# Patient Record
Sex: Female | Born: 1955 | Race: Black or African American | Hispanic: No | Marital: Married | Smoking: Former smoker
Health system: Southern US, Community
[De-identification: ages and names within clinical notes are randomized; demographics above are authoritative.]

## PROBLEM LIST (undated history)

## (undated) ENCOUNTER — Inpatient Hospital Stay: Admission: EM | Payer: Self-pay | Source: Home / Self Care

## (undated) DIAGNOSIS — R51 Headache: Secondary | ICD-10-CM

## (undated) DIAGNOSIS — K219 Gastro-esophageal reflux disease without esophagitis: Secondary | ICD-10-CM

## (undated) DIAGNOSIS — K1120 Sialoadenitis, unspecified: Secondary | ICD-10-CM

## (undated) DIAGNOSIS — Z923 Personal history of irradiation: Secondary | ICD-10-CM

## (undated) DIAGNOSIS — T8859XA Other complications of anesthesia, initial encounter: Secondary | ICD-10-CM

## (undated) DIAGNOSIS — I10 Essential (primary) hypertension: Secondary | ICD-10-CM

## (undated) DIAGNOSIS — K635 Polyp of colon: Secondary | ICD-10-CM

## (undated) DIAGNOSIS — Z800 Family history of malignant neoplasm of digestive organs: Secondary | ICD-10-CM

## (undated) DIAGNOSIS — F410 Panic disorder [episodic paroxysmal anxiety] without agoraphobia: Secondary | ICD-10-CM

## (undated) DIAGNOSIS — F17210 Nicotine dependence, cigarettes, uncomplicated: Secondary | ICD-10-CM

## (undated) DIAGNOSIS — K8020 Calculus of gallbladder without cholecystitis without obstruction: Secondary | ICD-10-CM

## (undated) DIAGNOSIS — C50919 Malignant neoplasm of unspecified site of unspecified female breast: Secondary | ICD-10-CM

## (undated) DIAGNOSIS — F419 Anxiety disorder, unspecified: Secondary | ICD-10-CM

## (undated) DIAGNOSIS — T4145XA Adverse effect of unspecified anesthetic, initial encounter: Secondary | ICD-10-CM

## (undated) DIAGNOSIS — Z8042 Family history of malignant neoplasm of prostate: Secondary | ICD-10-CM

## (undated) DIAGNOSIS — J449 Chronic obstructive pulmonary disease, unspecified: Secondary | ICD-10-CM

## (undated) DIAGNOSIS — Z862 Personal history of diseases of the blood and blood-forming organs and certain disorders involving the immune mechanism: Secondary | ICD-10-CM

## (undated) DIAGNOSIS — C801 Malignant (primary) neoplasm, unspecified: Secondary | ICD-10-CM

## (undated) DIAGNOSIS — Z803 Family history of malignant neoplasm of breast: Secondary | ICD-10-CM

## (undated) DIAGNOSIS — R7303 Prediabetes: Secondary | ICD-10-CM

## (undated) DIAGNOSIS — F32A Depression, unspecified: Secondary | ICD-10-CM

## (undated) DIAGNOSIS — F329 Major depressive disorder, single episode, unspecified: Secondary | ICD-10-CM

## (undated) DIAGNOSIS — M1990 Unspecified osteoarthritis, unspecified site: Secondary | ICD-10-CM

## (undated) DIAGNOSIS — T7840XA Allergy, unspecified, initial encounter: Secondary | ICD-10-CM

## (undated) DIAGNOSIS — S21009A Unspecified open wound of unspecified breast, initial encounter: Secondary | ICD-10-CM

## (undated) DIAGNOSIS — Z8709 Personal history of other diseases of the respiratory system: Secondary | ICD-10-CM

## (undated) DIAGNOSIS — M7989 Other specified soft tissue disorders: Secondary | ICD-10-CM

## (undated) DIAGNOSIS — K5730 Diverticulosis of large intestine without perforation or abscess without bleeding: Secondary | ICD-10-CM

## (undated) DIAGNOSIS — E785 Hyperlipidemia, unspecified: Secondary | ICD-10-CM

## (undated) DIAGNOSIS — D649 Anemia, unspecified: Secondary | ICD-10-CM

## (undated) DIAGNOSIS — M2550 Pain in unspecified joint: Secondary | ICD-10-CM

## (undated) HISTORY — DX: Malignant neoplasm of unspecified site of unspecified female breast: C50.919

## (undated) HISTORY — DX: Depression, unspecified: F32.A

## (undated) HISTORY — DX: Family history of malignant neoplasm of digestive organs: Z80.0

## (undated) HISTORY — DX: Unspecified osteoarthritis, unspecified site: M19.90

## (undated) HISTORY — DX: Major depressive disorder, single episode, unspecified: F32.9

## (undated) HISTORY — DX: Panic disorder (episodic paroxysmal anxiety): F41.0

## (undated) HISTORY — DX: Hyperlipidemia, unspecified: E78.5

## (undated) HISTORY — DX: Chronic obstructive pulmonary disease, unspecified: J44.9

## (undated) HISTORY — DX: Polyp of colon: K63.5

## (undated) HISTORY — PX: BREAST EXCISIONAL BIOPSY: SUR124

## (undated) HISTORY — PX: BIOPSY BREAST: PRO8

## (undated) HISTORY — DX: Calculus of gallbladder without cholecystitis without obstruction: K80.20

## (undated) HISTORY — DX: Other specified soft tissue disorders: M79.89

## (undated) HISTORY — DX: Diverticulosis of large intestine without perforation or abscess without bleeding: K57.30

## (undated) HISTORY — DX: Anemia, unspecified: D64.9

## (undated) HISTORY — DX: Gastro-esophageal reflux disease without esophagitis: K21.9

## (undated) HISTORY — PX: POLYPECTOMY: SHX149

## (undated) HISTORY — DX: Family history of malignant neoplasm of prostate: Z80.42

## (undated) HISTORY — DX: Anxiety disorder, unspecified: F41.9

## (undated) HISTORY — DX: Family history of malignant neoplasm of breast: Z80.3

## (undated) HISTORY — DX: Personal history of diseases of the blood and blood-forming organs and certain disorders involving the immune mechanism: Z86.2

## (undated) HISTORY — DX: Pain in unspecified joint: M25.50

## (undated) HISTORY — DX: Personal history of other diseases of the respiratory system: Z87.09

## (undated) HISTORY — DX: Nicotine dependence, cigarettes, uncomplicated: F17.210

## (undated) HISTORY — DX: Sialoadenitis, unspecified: K11.20

## (undated) HISTORY — PX: COLONOSCOPY: SHX174

---
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
## 1993-02-27 HISTORY — PX: OTHER SURGICAL HISTORY: SHX169

## 1998-02-27 HISTORY — PX: TOTAL ABDOMINAL HYSTERECTOMY: SHX209

## 1998-10-27 ENCOUNTER — Inpatient Hospital Stay (HOSPITAL_COMMUNITY): Admission: RE | Admit: 1998-10-27 | Discharge: 1998-10-30 | Payer: Self-pay | Admitting: Obstetrics & Gynecology

## 1998-10-27 ENCOUNTER — Encounter (INDEPENDENT_AMBULATORY_CARE_PROVIDER_SITE_OTHER): Payer: Self-pay

## 1999-11-02 ENCOUNTER — Encounter: Payer: Self-pay | Admitting: Pulmonary Disease

## 1999-11-02 ENCOUNTER — Ambulatory Visit (HOSPITAL_COMMUNITY): Admission: RE | Admit: 1999-11-02 | Discharge: 1999-11-02 | Payer: Self-pay | Admitting: Pulmonary Disease

## 1999-11-14 ENCOUNTER — Other Ambulatory Visit: Admission: RE | Admit: 1999-11-14 | Discharge: 1999-11-14 | Payer: Self-pay | Admitting: Obstetrics & Gynecology

## 1999-11-16 ENCOUNTER — Encounter: Payer: Self-pay | Admitting: Obstetrics & Gynecology

## 1999-11-16 ENCOUNTER — Ambulatory Visit (HOSPITAL_COMMUNITY): Admission: RE | Admit: 1999-11-16 | Discharge: 1999-11-16 | Payer: Self-pay | Admitting: Obstetrics & Gynecology

## 1999-11-25 ENCOUNTER — Ambulatory Visit (HOSPITAL_COMMUNITY): Admission: RE | Admit: 1999-11-25 | Discharge: 1999-11-25 | Payer: Self-pay | Admitting: Obstetrics & Gynecology

## 1999-11-25 ENCOUNTER — Encounter (INDEPENDENT_AMBULATORY_CARE_PROVIDER_SITE_OTHER): Payer: Self-pay | Admitting: Specialist

## 2000-11-01 ENCOUNTER — Other Ambulatory Visit: Admission: RE | Admit: 2000-11-01 | Discharge: 2000-11-01 | Payer: Self-pay | Admitting: Obstetrics & Gynecology

## 2000-12-26 ENCOUNTER — Encounter: Payer: Self-pay | Admitting: Emergency Medicine

## 2000-12-26 ENCOUNTER — Emergency Department (HOSPITAL_COMMUNITY): Admission: EM | Admit: 2000-12-26 | Discharge: 2000-12-26 | Payer: Self-pay | Admitting: Emergency Medicine

## 2001-11-04 ENCOUNTER — Other Ambulatory Visit: Admission: RE | Admit: 2001-11-04 | Discharge: 2001-11-04 | Payer: Self-pay | Admitting: Obstetrics & Gynecology

## 2002-09-04 ENCOUNTER — Emergency Department (HOSPITAL_COMMUNITY): Admission: EM | Admit: 2002-09-04 | Discharge: 2002-09-04 | Payer: Self-pay | Admitting: Emergency Medicine

## 2002-11-27 ENCOUNTER — Other Ambulatory Visit: Admission: RE | Admit: 2002-11-27 | Discharge: 2002-11-27 | Payer: Self-pay | Admitting: Obstetrics & Gynecology

## 2003-12-11 ENCOUNTER — Other Ambulatory Visit: Admission: RE | Admit: 2003-12-11 | Discharge: 2003-12-11 | Payer: Self-pay | Admitting: Obstetrics & Gynecology

## 2004-04-03 ENCOUNTER — Emergency Department (HOSPITAL_COMMUNITY): Admission: EM | Admit: 2004-04-03 | Discharge: 2004-04-03 | Payer: Self-pay | Admitting: Emergency Medicine

## 2004-04-28 ENCOUNTER — Other Ambulatory Visit: Admission: RE | Admit: 2004-04-28 | Discharge: 2004-04-28 | Payer: Self-pay | Admitting: Obstetrics & Gynecology

## 2004-09-26 ENCOUNTER — Ambulatory Visit: Payer: Self-pay | Admitting: Pulmonary Disease

## 2004-10-10 ENCOUNTER — Ambulatory Visit: Payer: Self-pay | Admitting: Gastroenterology

## 2004-10-12 ENCOUNTER — Ambulatory Visit: Payer: Self-pay | Admitting: Pulmonary Disease

## 2004-11-02 ENCOUNTER — Ambulatory Visit: Payer: Self-pay | Admitting: Gastroenterology

## 2004-12-05 ENCOUNTER — Other Ambulatory Visit: Admission: RE | Admit: 2004-12-05 | Discharge: 2004-12-05 | Payer: Self-pay | Admitting: Obstetrics & Gynecology

## 2005-02-28 ENCOUNTER — Ambulatory Visit: Payer: Self-pay | Admitting: Pulmonary Disease

## 2005-09-20 ENCOUNTER — Ambulatory Visit: Payer: Self-pay | Admitting: Pulmonary Disease

## 2006-02-27 HISTORY — PX: TRIGGER FINGER RELEASE: SHX641

## 2006-08-22 ENCOUNTER — Ambulatory Visit: Payer: Self-pay | Admitting: Pulmonary Disease

## 2006-08-22 LAB — CONVERTED CEMR LAB
ALT: 15 units/L (ref 0–35)
AST: 24 units/L (ref 0–37)
Basophils Relative: 0.9 % (ref 0.0–1.0)
Bilirubin, Direct: 0.2 mg/dL (ref 0.0–0.3)
CO2: 28 meq/L (ref 19–32)
Calcium: 9.7 mg/dL (ref 8.4–10.5)
Chloride: 106 meq/L (ref 96–112)
Creatinine, Ser: 0.7 mg/dL (ref 0.4–1.2)
Eosinophils Absolute: 0.2 10*3/uL (ref 0.0–0.6)
Eosinophils Relative: 2.4 % (ref 0.0–5.0)
GFR calc non Af Amer: 93 mL/min
Glucose, Bld: 86 mg/dL (ref 70–99)
HCT: 37.3 % (ref 36.0–46.0)
Ketones, ur: NEGATIVE mg/dL
Neutrophils Relative %: 62.8 % (ref 43.0–77.0)
Nitrite: NEGATIVE
Platelets: 205 10*3/uL (ref 150–400)
RBC: 4.94 M/uL (ref 3.87–5.11)
RDW: 13.4 % (ref 11.5–14.6)
Sodium: 144 meq/L (ref 135–145)
Specific Gravity, Urine: >=1.03 (ref 1.000–1.03)
Total Bilirubin: 0.7 mg/dL (ref 0.3–1.2)
Total CHOL/HDL Ratio: 2.8
Total Protein, Urine: NEGATIVE mg/dL
Triglycerides: 88 mg/dL (ref 0–149)
Urine Glucose: NEGATIVE mg/dL
Urobilinogen, UA: 0.2 (ref 0.0–1.0)
WBC: 8.1 10*3/uL (ref 4.5–10.5)
pH: 6 (ref 5.0–8.0)

## 2006-08-27 ENCOUNTER — Ambulatory Visit (HOSPITAL_COMMUNITY): Admission: RE | Admit: 2006-08-27 | Discharge: 2006-08-27 | Payer: Self-pay | Admitting: Pulmonary Disease

## 2006-09-10 ENCOUNTER — Ambulatory Visit: Payer: Self-pay | Admitting: Pulmonary Disease

## 2006-09-10 LAB — CONVERTED CEMR LAB
Fecal Occult Blood: NEGATIVE
OCCULT 2: NEGATIVE
OCCULT 5: NEGATIVE

## 2006-10-12 ENCOUNTER — Ambulatory Visit: Payer: Self-pay | Admitting: Pulmonary Disease

## 2007-04-23 ENCOUNTER — Ambulatory Visit: Payer: Self-pay | Admitting: Pulmonary Disease

## 2007-04-23 DIAGNOSIS — F4323 Adjustment disorder with mixed anxiety and depressed mood: Secondary | ICD-10-CM

## 2007-04-23 DIAGNOSIS — R51 Headache: Secondary | ICD-10-CM | POA: Insufficient documentation

## 2007-04-23 DIAGNOSIS — R519 Headache, unspecified: Secondary | ICD-10-CM | POA: Insufficient documentation

## 2007-04-23 DIAGNOSIS — I872 Venous insufficiency (chronic) (peripheral): Secondary | ICD-10-CM

## 2007-04-23 DIAGNOSIS — E785 Hyperlipidemia, unspecified: Secondary | ICD-10-CM

## 2007-10-09 ENCOUNTER — Ambulatory Visit: Payer: Self-pay | Admitting: Pulmonary Disease

## 2007-10-13 DIAGNOSIS — F17200 Nicotine dependence, unspecified, uncomplicated: Secondary | ICD-10-CM

## 2007-10-13 DIAGNOSIS — J40 Bronchitis, not specified as acute or chronic: Secondary | ICD-10-CM | POA: Insufficient documentation

## 2007-10-13 DIAGNOSIS — D869 Sarcoidosis, unspecified: Secondary | ICD-10-CM | POA: Insufficient documentation

## 2007-10-13 DIAGNOSIS — E559 Vitamin D deficiency, unspecified: Secondary | ICD-10-CM

## 2007-10-13 DIAGNOSIS — N390 Urinary tract infection, site not specified: Secondary | ICD-10-CM | POA: Insufficient documentation

## 2007-10-13 DIAGNOSIS — D649 Anemia, unspecified: Secondary | ICD-10-CM | POA: Insufficient documentation

## 2007-10-13 LAB — CONVERTED CEMR LAB
Albumin: 3.9 g/dL (ref 3.5–5.2)
Alkaline Phosphatase: 67 units/L (ref 39–117)
BUN: 13 mg/dL (ref 6–23)
Basophils Relative: 0.4 % (ref 0.0–3.0)
Bilirubin Urine: NEGATIVE
Calcium: 9.9 mg/dL (ref 8.4–10.5)
Cholesterol: 199 mg/dL (ref 0–200)
Crystals: NEGATIVE
Eosinophils Absolute: 0.1 10*3/uL (ref 0.0–0.7)
Eosinophils Relative: 1.6 % (ref 0.0–5.0)
GFR calc Af Amer: 113 mL/min
Glucose, Bld: 92 mg/dL (ref 70–99)
HCT: 36.9 % (ref 36.0–46.0)
HDL: 67.5 mg/dL (ref >39.0)
Hemoglobin, Urine: NEGATIVE
Ketones, ur: NEGATIVE mg/dL
MCHC: 33.1 g/dL (ref 30.0–36.0)
MCV: 75.6 fL — ABNORMAL LOW (ref 78.0–100.0)
Monocytes Absolute: 0.5 10*3/uL (ref 0.1–1.0)
Platelets: 197 10*3/uL (ref 150–400)
Potassium: 4.7 meq/L (ref 3.5–5.1)
RBC: 4.88 M/uL (ref 3.87–5.11)
Total Protein: 7.1 g/dL (ref 6.0–8.3)
Urine Glucose: NEGATIVE mg/dL
Urobilinogen, UA: 0.2 (ref 0.0–1.0)
WBC: 8.3 10*3/uL (ref 4.5–10.5)

## 2008-04-22 ENCOUNTER — Ambulatory Visit: Payer: Self-pay | Admitting: Internal Medicine

## 2008-07-22 ENCOUNTER — Emergency Department (HOSPITAL_COMMUNITY): Admission: EM | Admit: 2008-07-22 | Discharge: 2008-07-22 | Payer: Self-pay | Admitting: Emergency Medicine

## 2008-07-23 ENCOUNTER — Ambulatory Visit: Payer: Self-pay | Admitting: Pulmonary Disease

## 2008-07-23 DIAGNOSIS — F410 Panic disorder [episodic paroxysmal anxiety] without agoraphobia: Secondary | ICD-10-CM

## 2008-08-07 ENCOUNTER — Ambulatory Visit: Payer: Self-pay | Admitting: Licensed Clinical Social Worker

## 2008-08-21 ENCOUNTER — Ambulatory Visit: Payer: Self-pay | Admitting: Licensed Clinical Social Worker

## 2008-09-18 ENCOUNTER — Ambulatory Visit: Payer: Self-pay | Admitting: Licensed Clinical Social Worker

## 2008-11-06 ENCOUNTER — Ambulatory Visit: Payer: Self-pay | Admitting: Pulmonary Disease

## 2008-11-06 DIAGNOSIS — M1990 Unspecified osteoarthritis, unspecified site: Secondary | ICD-10-CM | POA: Insufficient documentation

## 2008-11-07 LAB — CONVERTED CEMR LAB
Albumin: 4 g/dL (ref 3.5–5.2)
Basophils Absolute: 0 10*3/uL (ref 0.0–0.1)
Bilirubin Urine: NEGATIVE
CO2: 30 meq/L (ref 19–32)
Calcium: 9.8 mg/dL (ref 8.4–10.5)
Direct LDL: 119.6 mg/dL
Eosinophils Absolute: 0.2 10*3/uL (ref 0.0–0.7)
Glucose, Bld: 91 mg/dL (ref 70–99)
HCT: 36.8 % (ref 36.0–46.0)
Hemoglobin: 11.9 g/dL — ABNORMAL LOW (ref 12.0–15.0)
Iron: 76 ug/dL (ref 42–145)
Leukocytes, UA: NEGATIVE
Lymphocytes Relative: 22.9 % (ref 12.0–46.0)
Lymphs Abs: 1.9 10*3/uL (ref 0.7–4.0)
MCHC: 32.4 g/dL (ref 30.0–36.0)
Monocytes Absolute: 0.6 10*3/uL (ref 0.1–1.0)
Neutro Abs: 5.6 10*3/uL (ref 1.4–7.7)
RDW: 14.5 % (ref 11.5–14.6)
Saturation Ratios: 21.1 % (ref 20.0–50.0)
Sodium: 144 meq/L (ref 135–145)
TSH: 1.73 microintl units/mL (ref 0.35–5.50)
Transferrin: 257.3 mg/dL (ref 212.0–360.0)
Triglycerides: 78 mg/dL (ref 0.0–149.0)
Urobilinogen, UA: 0.2 (ref 0.0–1.0)

## 2009-02-27 HISTORY — PX: BUNIONECTOMY: SHX129

## 2009-08-19 ENCOUNTER — Telehealth: Payer: Self-pay | Admitting: Gastroenterology

## 2009-08-23 ENCOUNTER — Encounter (INDEPENDENT_AMBULATORY_CARE_PROVIDER_SITE_OTHER): Payer: Self-pay | Admitting: *Deleted

## 2009-10-27 ENCOUNTER — Encounter (INDEPENDENT_AMBULATORY_CARE_PROVIDER_SITE_OTHER): Payer: Self-pay | Admitting: *Deleted

## 2009-10-29 ENCOUNTER — Ambulatory Visit: Payer: Self-pay | Admitting: Gastroenterology

## 2009-11-05 ENCOUNTER — Ambulatory Visit: Payer: Self-pay | Admitting: Gastroenterology

## 2009-11-09 ENCOUNTER — Encounter: Payer: Self-pay | Admitting: Gastroenterology

## 2009-11-11 ENCOUNTER — Encounter: Payer: Self-pay | Admitting: Gastroenterology

## 2009-11-26 ENCOUNTER — Encounter: Payer: Self-pay | Admitting: Pulmonary Disease

## 2010-01-04 ENCOUNTER — Telehealth: Payer: Self-pay | Admitting: Pulmonary Disease

## 2010-01-24 ENCOUNTER — Telehealth (INDEPENDENT_AMBULATORY_CARE_PROVIDER_SITE_OTHER): Payer: Self-pay | Admitting: *Deleted

## 2010-01-25 ENCOUNTER — Ambulatory Visit: Payer: Self-pay | Admitting: Pulmonary Disease

## 2010-01-25 DIAGNOSIS — K5730 Diverticulosis of large intestine without perforation or abscess without bleeding: Secondary | ICD-10-CM | POA: Insufficient documentation

## 2010-01-25 DIAGNOSIS — D126 Benign neoplasm of colon, unspecified: Secondary | ICD-10-CM | POA: Insufficient documentation

## 2010-01-28 LAB — CONVERTED CEMR LAB
AST: 13 units/L (ref 0–37)
Albumin: 4 g/dL (ref 3.5–5.2)
BUN: 15 mg/dL (ref 6–23)
Basophils Absolute: 0 10*3/uL (ref 0.0–0.1)
CO2: 26 meq/L (ref 19–32)
Direct LDL: 142.6 mg/dL
Eosinophils Relative: 1.3 % (ref 0.0–5.0)
GFR calc non Af Amer: 128.1 mL/min (ref >60)
Glucose, Bld: 98 mg/dL (ref 70–99)
HCT: 37 % (ref 36.0–46.0)
HDL: 89.2 mg/dL (ref >39.00)
Iron: 80 ug/dL (ref 42–145)
Lymphocytes Relative: 24 % (ref 12.0–46.0)
Lymphs Abs: 2.2 10*3/uL (ref 0.7–4.0)
Monocytes Relative: 6.1 % (ref 3.0–12.0)
Neutrophils Relative %: 68.3 % (ref 43.0–77.0)
Platelets: 216 10*3/uL (ref 150.0–400.0)
Potassium: 4.3 meq/L (ref 3.5–5.1)
RDW: 15.4 % — ABNORMAL HIGH (ref 11.5–14.6)
Saturation Ratios: 19.9 % — ABNORMAL LOW (ref 20.0–50.0)
TSH: 1.45 microintl units/mL (ref 0.35–5.50)
Total Protein: 7 g/dL (ref 6.0–8.3)
Transferrin: 287.3 mg/dL (ref 212.0–360.0)
Triglycerides: 93 mg/dL (ref 0.0–149.0)
Vit D, 25-Hydroxy: 37 ng/mL (ref 30–89)
WBC: 9.2 10*3/uL (ref 4.5–10.5)

## 2010-03-29 NOTE — Miscellaneous (Signed)
Summary: previsit prep/Rm  Clinical Lists Changes  Medications: Added new medication of MOVIPREP 100 GM  SOLR (PEG-KCL-NACL-NASULF-NA ASC-C) As per prep instructions. - Signed Rx of MOVIPREP 100 GM  SOLR (PEG-KCL-NACL-NASULF-NA ASC-C) As per prep instructions.;  #1 x 0;  Signed;  Entered by: Sherren Kerns RN;  Authorized by: Louis Meckel MD;  Method used: Electronically to [facility]. [phone]*, [address] [place], [place]  [address], Ph: [phone], Fax: [phone] Observations: Added new observation of ALLERGY REV: Done (10/29/2009 9:50)    Prescriptions: MOVIPREP 100 GM  SOLR (PEG-KCL-NACL-NASULF-NA ASC-C) As per prep instructions.  #1 x 0   Entered by:   Sherren Kerns RN   Authorized by:   Louis Meckel MD   Signed by:   Sherren Kerns RN on 10/29/2009   Method used:   Electronically to        [facility] [address] [phone]* (retail)       [address] [place], [place]  [address]       Ph: [phone]       Fax: [phone]   RxID:   [id number]

## 2010-03-29 NOTE — Letter (Signed)
Summary: [facility] Instructions  [facility]  [address] [place], [place] [address]   Phone: [phone]  Fax: [phone]       CAIYA BETTES    [birth year]    MRN: [id number]        Procedure Day Dorna Bloom:  Farrell Ours  11/05/09     Arrival Time:  8:00AM     Procedure Time:  9:00AM     Location of Procedure:                    _ X_  [facility] (4th Floor)                       PREPARATION FOR COLONOSCOPY WITH MOVIPREP   Starting 5 days prior to your procedure 10/31/09 do not eat nuts, seeds, popcorn, corn, beans, peas,  salads, or any raw vegetables.  Do not take any fiber supplements (e.g. Metamucil, Citrucel, and Benefiber).  THE DAY BEFORE YOUR PROCEDURE         DATE: 11/04/09  DAY: THURSDAY  1.  Drink clear liquids the entire day-NO SOLID FOOD  2.  Do not drink anything colored red or purple.  Avoid juices with pulp.  No orange juice.  3.  Drink at least 64 oz. (8 glasses) of fluid/clear liquids during the day to prevent dehydration and help the prep work efficiently.  CLEAR LIQUIDS INCLUDE: Water Jello Ice Popsicles Tea (sugar ok, no milk/cream) Powdered fruit flavored drinks Coffee (sugar ok, no milk/cream) Gatorade Juice: apple, white grape, white cranberry  Lemonade Clear bullion, consomm, broth Carbonated beverages (any kind) Strained chicken noodle soup Hard Candy                             4.  In the morning, mix first dose of MoviPrep solution:    Empty 1 Pouch A and 1 Pouch B into the disposable container    Add lukewarm drinking water to the top line of the container. Mix to dissolve    Refrigerate (mixed solution should be used within 24 hrs)  5.  Begin drinking the prep at 5:00 p.m. The MoviPrep container is divided by 4 marks.   Every 15 minutes drink the solution down to the next mark (approximately 8 oz) until the full liter is complete.   6.  Follow completed prep with 16 oz of clear liquid of your choice (Nothing  red or purple).  Continue to drink clear liquids until bedtime.  7.  Before going to bed, mix second dose of MoviPrep solution:    Empty 1 Pouch A and 1 Pouch B into the disposable container    Add lukewarm drinking water to the top line of the container. Mix to dissolve    Refrigerate  THE DAY OF YOUR PROCEDURE      DATE: 11/05/09   DAY:  FRIDAY  Beginning at 4:00AM (5 hours before procedure):         1. Every 15 minutes, drink the solution down to the next mark (approx 8 oz) until the full liter is complete.  2. Follow completed prep with 16 oz. of clear liquid of your choice.    3. You may drink clear liquids until 7:00AM (2 HOURS BEFORE PROCEDURE).   MEDICATION INSTRUCTIONS  Unless otherwise instructed, you should take regular prescription medications with a small sip of water   as early as possible the  morning of your procedure.   Additional medication instructions: Stop Iron pill 7days prior to procedure,today         OTHER INSTRUCTIONS  You will need a responsible adult at least 56 years of age to accompany you and drive you home.   This person must remain in the waiting room during your procedure.  Wear loose fitting clothing that is easily removed.  Leave jewelry and other valuables at home.  However, you may wish to bring a book to read or  an iPod/MP3 player to listen to music as you wait for your procedure to start.  Remove all body piercing jewelry and leave at home.  Total time from sign-in until discharge is approximately 2-3 hours.  You should go home directly after your procedure and rest.  You can resume normal activities the  day after your procedure.  The day of your procedure you should not:   Drive   Make legal decisions   Operate machinery   Drink alcohol   Return to work  You will receive specific instructions about eating, activities and medications before you leave.    The above instructions have been reviewed and explained to  me by   Sherren Kerns RN  October 29, 2009 10:16 AM    I fully understand and can verbalize these instructions _____________________________ Date _________

## 2010-03-29 NOTE — Progress Notes (Signed)
Summary: Recall Colon Scheduled  Phone Note Call from Patient Call back at [phone]   Caller: Patient Call For: Dr. Arlyce Dice Reason for Call: Talk to Nurse Summary of Call: pt would like to move up her recall COL date... father recently diagnosed with COL cancer... pt interested in having COL's every 5 years Initial call taken by: Vallarie Mare,  August 19, 2009 4:15 PM  Follow-up for Phone Call        She should have colo 5 years from last exam Follow-up by: Louis Meckel MD,  August 23, 2009 8:36 AM  Additional Follow-up for Phone Call Additional follow up Details #1::        Last Colon 11-02-2004, so she will need one in September.  Pt. scheduled her previsit for 10-29-09 at 10am and Colonoscopy on 11-05-09 at 9am. Pt. instructed to call back as needed.  Additional Follow-up by: Laureen Ochs LPN,  August 23, 2009 10:25 AM

## 2010-03-29 NOTE — Procedures (Signed)
Summary: Colonoscopy  Patient: [name] Note: All result statuses are Final unless otherwise noted.  Tests: (1) Colonoscopy (COL)   COL Colonoscopy           DONE     [facility]     [address]     [place], [place]  [address]           COLONOSCOPY PROCEDURE REPORT           PATIENT:  Marilyn [name], Marilyn [name]  MR#:  [id number]     BIRTHDATE:  07/14/1955, 54 yrs. old  GENDER:  female           ENDOSCOPIST:  Barbette Hair. Arlyce Dice, MD     Referred by:           PROCEDURE DATE:  11/05/2009     PROCEDURE:  Colon with cold biopsy polypectomy     ASA CLASS:  Class II     INDICATIONS:  1) Elevated Risk Screening  2) family history of     colon cancer Father           MEDICATIONS:   Fentanyl 100 mcg IV, Versed 10 mg IV           DESCRIPTION OF PROCEDURE:   After the risks benefits and     alternatives of the procedure were thoroughly explained, informed     consent was obtained.  Digital rectal exam was performed and     revealed no abnormalities.   The [id number] endoscope was     introduced through the anus and advanced to the cecum, which was     identified by both the appendix and ileocecal valve, without     limitations.  The quality of the prep was excellent, using     MoviPrep.  The instrument was then slowly withdrawn as the colon     was fully examined.     <<PROCEDUREIMAGES>>           FINDINGS:  Two polyps were found in the sigmoid colon (see image10     and image8). 2 2mm polyps at 15 and 20cm from anus - removed via     cold bx forceps, submitted to pathology  Mild diverticulosis was     found in the sigmoid colon (see image1).  This was otherwise a     normal examination of the colon (see image2, image3, image4,     image5, and image11).   Retroflexed views in the rectum revealed     no abnormalities.    The time to cecum =  4.0  minutes. The scope     was then withdrawn (time =  6.25  min) from the patient and the     procedure completed.        COMPLICATIONS:  None           ENDOSCOPIC IMPRESSION:     1) Two polyps in the sigmoid colon     2) Mild diverticulosis in the sigmoid colon     3) Otherwise normal examination     RECOMMENDATIONS:     1) Given your significant family history of colon cancer, you     should have a repeat colonoscopy in 5 years           REPEAT EXAM:  In 5 year(s) for Colonoscopy.           ______________________________     Barbette Hair. Arlyce Dice, MD  CC: Michele Mcalpine, MD           n.     Rosalie DoctorBarbette Hair. Kaplan at 11/05/2009 09:41 AM           Standley Dakins, [id number]  Note: An exclamation mark (!) indicates a result that was not dispersed into the flowsheet. Document Creation Date: 11/05/2009 9:42 AM _______________________________________________________________________  (1) Order result status: Final Collection or observation date-time: 11/05/2009 09:35 Requested date-time:  Receipt date-time:  Reported date-time:  Referring Physician:   Ordering Physician: Melvia Heaps [phone]) Specimen Source:  Source: Launa Grill Order Number: [id number] Lab site:   Appended Document: Colonoscopy     Procedures Next Due Date:    Colonoscopy: 10/2014

## 2010-03-29 NOTE — Miscellaneous (Signed)
Summary: Fluvirin/[facility]  [facility]irin/[facility]   Imported By: [employer] 12/07/2009 09:09:06  _____________________________________________________________________  External Attachment:    Type:   Image     Comment:   External Document

## 2010-03-29 NOTE — Letter (Signed)
Summary: Results Letter  [facility]  [address] [place], [place] [address]   Phone: [phone]  Fax: [phone]        November 11, 2009 MRN: [id number]    Marilyn [name] [address] [place], [place]  [address]    Dear [name], Your colon biopsies  did not show any remarkable findings. In view of your family history of colon cancer, however, I recommend followup colonoscopy in 5 years.  Please continue with the recommendations previously discussed.  Should you have any further questions or immediate concers, feel free to contact me.  Sincerely,  Barbette Hair. Arlyce Dice, M.D., [facility]          Sincerely,  Louis Meckel MD  This letter has been electronically signed by your physician.  Appended Document: Results Letter letter mailed  Appended Document: flu shot 11-26-2009    Clinical Lists Changes  Observations: Added new observation of FLU VAX: Historical (11/26/2009 16:14)       Immunization History:  Influenza Immunization History:    Influenza:  historical (11/26/2009)

## 2010-03-29 NOTE — Progress Notes (Signed)
Summary: lab order  Phone Note Call from Patient Call back at Home Phone [phone]   Caller: Patient Call For: madel Reason for Call: Talk to Nurse Summary of Call: Patient has a physical schedule tomorrow @ 3:00pm.  Patient is asking for an order for labwork so she can come in tomorrow morning for fasting labs. Initial call taken by: Lehman Prom,  January 24, 2010 1:13 PM  Follow-up for Phone Call        per SN---ok for labs  lip-272.0/bmp-401.9/hepat-790.5/cbcd-285.9/tsh-244.9/ace level-135/vit d-733.00.  thanks Randell Loop CMA  January 25, 2010 11:45 AM   Labs in [place].  Pt aware and will have these drawn prior to appt today.  Follow-up by: Gweneth Dimitri RN,  January 25, 2010 12:02 PM

## 2010-03-29 NOTE — Letter (Signed)
Summary: Results Letter  [facility]  [address] [place], [place] [address]   Phone: [phone]  Fax: [phone]        November 09, 2009 MRN: [id number]    ADELIS DOCTER [address] [place], [place]  [address]    Dear [name],  Your biopsy results did not show any remarkable findings. I recommend followup colonoscopy in view of your family history of colon cancer.   Please continue with the recommendations previously discussed.  Should you have any further questions or immediate concers, feel free to contact me.  Sincerely,  Barbette Hair. Arlyce Dice, M.D., [facility]          Sincerely,  Louis Meckel MD  This letter has been electronically signed by your physician.

## 2010-03-29 NOTE — Progress Notes (Signed)
Summary: RETURNED CALL  Phone Note Call from Patient Call back at [facility] Phone [phone]   Caller: Patient Call For: Kahley Leib Summary of Call: PT RETURNED CALL FROM LEIGH. CALL # ABOVE OR CELL [phone] Initial call taken by: Tivis Ringer, CNA,  January 04, 2010 12:19 PM  Follow-up for Phone Call        CALLED AND SPOKE WITH PT AND SHE IS AWARE OF APPT BEING RESCHEDULED TO 11-29 AT 3PM---PT WILL CALL ME THE DAY BEFORE SHE COMES IN FOR HER LABS Randell Loop CMA  January 04, 2010 12:24 PM

## 2010-03-29 NOTE — Assessment & Plan Note (Signed)
Summary: CPX/LA   CC:  Yearly ROV & CPX....  History of Present Illness: 55 y/o BF here for a follow up visit... she has multiple medical problems as noted below...    ~  November 06, 2008:  here for CPX- anxiety/ panic much improved w/ counselling and incr Zoloft by Psych, uses the Rebeca Allegra just as needed now... still smoking  ~1/2 ppd and failed Chantix prev- we discussed options...  denies CP, palpit, SOB, cough/ phlegm, etc...   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 she had bilat bunion operations by Podiarty & has been less active w/ 22# weight gain & we discussed incr exercise (when her boot comes off) & diet therapy... she continues to smoke 1/2 ppd & wants to try nicotine replacement Rx since a relative had success w/ this; f/u CXR remains clear... she saw [name] for f/u colonoscopy 9/11- pos for divertics & 2 polyps= polypoid mucosa, f/u planned 58yrs due to [facility]...    Current Problem List:  PHYSICAL EXAMINATION (ICD-V70.0) - GYN= DrNeal who does her PAP smears, Mammograms, and BMD... he also checked her VitD level and started VitD 50K weekly, then changed her to 2000 u daily... she is s/p hysterectomy in 2000... ***note- she had an abd bruit investigated w/ an Abd MRA 6/08 and nothing found...  had TETANUS shot 2010 at [facility] after she stepped on nail...  Hx of BRONCHITIS (ICD-490) - no recent episodes...  ~  Baseline CXR is clear and WNL (no residuals from her sarcoidosis)  ~  Spirometry 7/06 showed FVC= 3.77 (130%), FEV1= 2.71 (113%), %1sec=72, mid-flows=66%...   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 totally resolved on Pred therapy w/ normalization of CXR and no recurrence off Pred now since 1992...  ~  CXR 9/10 remains clear & WNL.Marland Kitchen.  ~  CXR 11/11 remains clear & WNL.Marland KitchenMarland Kitchen  CIGARETTE SMOKER (ICD-305.1) - she continues to smoke 1/2 ppd>  we have discussed smoking cessation strategies- she tried Chantix in the past w/o help... she tells me she is going to try nicotine replacement Rx since this helped a relative of hres.  VENOUS INSUFFICIENCY (ICD-459.81) - she follows a low salt diet, elevates when nec, and wears support hose Prn.  HYPERLIPIDEMIA (ICD-272.4) - hx of hyperlipidemia w/ high HDL's prev...  ~  FLP 7/07 showed TChol 210, TG 56, HDL 81, LDL 101  ~  FLP 6/08 showed TChol 232, TG 88, HDL 83, LDL 112  ~  FLP 8/09 showed TChol 199, TG 85, HDL 68, LDL 115  ~  FLP 9/10 (wt=175#) showed TChol 231, TG 78, HDL 94, LDL 120  ~  FLP 11/11 (wt=197#) showed TChol 265, TG 93, HDL 89, LDL 143... needs better diet get wt down.  GERD (ICD-530.81) - uses OTC Prilosec as needed.  DIVERTICULOSIS OF COLON (ICD-562.10) IRRITABLE BOWEL SYNDROME (ICD-564.1) COLONIC POLYPS (ICD-211.3) Family Hx of COLON CANCER (ICD-153.9) -   ~  colonoscopy 9/06 by Dorris Singh was WNL- no divertics, no polyps... f/u planned 56yrs.  ~  Father was diagnosed w/ colon cancer in 2009 & being treated by oncology... pt will need colon f/u in 2011.  ~  f/u colonoscopy 9/11 by [name] showed divertics & 2 polyps= polypoid mucosa, f/u planned 66yrs.  Hx of UTI (ICD-599.0)  DEGENERATIVE JOINT DISEASE (ICD-715.90) - she has mild DJD esp in  knees & prev had cortisone shots from ortho- DrMcKinley... not on regular meds, she uses Tylenol Prn...  VITAMIN D DEFICIENCY (ICD-268.9) - followed by DrNeal & currently taking Vit D 1000 u caps- 2 daily.  HEADACHE (ICD-784.0)  Hx of DEPRESSION (ICD-311) - husb passed away in 07-18-03... she sees DrMcKinney on ZOLOFT 100mg /d...  Hx of ANEMIA (ICD-285.9) - Hg chronically in the 11-12 range...  ~  labs 9/10 showed Hg= 11.9, MCV= 77, Fe= 76 (21%sat)  ~  labs 11/11 showed Hg= 12.0, MCV= 76, Fe= 80 (20%sat)   Preventive Screening-Counseling & Management  Alcohol-Tobacco     Smoking Status: current     Packs/Day: 1/2 ppd     Year  Started: 1080's  Allergies: 1)  ! Penicillin  Comments:  Nurse/Medical Assistant: The patient's medications and allergies were reviewed with the patient and were updated in the Medication and Allergy Lists.  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) CIGARETTE SMOKER (ICD-305.1) VENOUS INSUFFICIENCY (ICD-459.81) HYPERLIPIDEMIA (ICD-272.4) GERD (ICD-530.81) DIVERTICULOSIS OF COLON (ICD-562.10) IRRITABLE BOWEL SYNDROME (ICD-564.1) COLONIC POLYPS (ICD-211.3) Family Hx of COLON CANCER (ICD-153.9) Hx of UTI (ICD-599.0) DEGENERATIVE JOINT DISEASE (ICD-715.90) VITAMIN D DEFICIENCY (ICD-268.9) HEADACHE (ICD-784.0) PANIC DISORDER (ICD-300.01) DEPRESSION (ICD-311) Hx of ANEMIA (ICD-285.9)  Past Surgical History: S/P hysterectomy in 07/18/98 by DrNeal S/P trigger finger surgery by DrWeingold 07-18-2006 S/P bilat bunionectomies by DrTuckman 2009-07-17  Family History: Reviewed history from 11/06/2008 and no changes required. mother alive age 24, hx anemia & +HPylori father alive age 33, hx prostate cancer & dx w/ colon ca 2007/07/18 3 Siblings: 2 Sisters- one w/ hx breast cancer, one w/ sarcoid & MS 1 Briother-  hx of HBP,prostate cancer, hyperchol.  Social History: Reviewed history from 11/06/2008 and no changes required. Widow, husb died 18-Jul-2003 2 step-children smokes 1/2 ppd exercise 3 times per week 2 cups of caffeine daily etoh---social Employ: post office  Review of Systems  The patient denies fever, chills, sweats, anorexia, fatigue, weakness, malaise, weight loss, sleep disorder, blurring, diplopia, eye irritation, eye discharge, vision loss, eye pain, photophobia, earache, ear discharge, tinnitus, decreased hearing, nasal congestion, nosebleeds, sore throat, hoarseness, chest pain, palpitations, syncope, dyspnea on exertion, orthopnea, PND, peripheral edema, cough, dyspnea at rest, excessive sputum, hemoptysis, wheezing, pleurisy, nausea,  vomiting, diarrhea, constipation, change in bowel habits, abdominal pain, melena, hematochezia, jaundice, gas/bloating, indigestion/heartburn, dysphagia, odynophagia, dysuria, hematuria, urinary frequency, urinary hesitancy, nocturia, incontinence, back pain, joint pain, joint swelling, muscle cramps, muscle weakness, stiffness, arthritis, sciatica, restless legs, leg pain at night, leg pain with exertion, rash, itching, dryness, suspicious lesions, paralysis, paresthesias, seizures, tremors, vertigo, transient blindness, frequent falls, frequent headaches, difficulty walking, depression, anxiety, memory loss, confusion, cold intolerance, heat intolerance, polydipsia, polyphagia, polyuria, unusual weight change, abnormal bruising, bleeding, enlarged lymph nodes, urticaria, allergic rash, hay fever, and recurrent infections.    Vital Signs:  Patient profile:   55 year old female Height:      65 inches Weight:      196.38 pounds BMI:     32.80 O2 Sat:      100 % on Room air Temp:     99.5 degrees F oral Pulse rate:   70 / minute BP sitting:   116 / 80  (left arm) Cuff size:   regular  Vitals Entered By: Randell Loop CMA (January 25, 2010 2:46 PM)  O2 Sat at Rest %:  100 O2 Flow:  Room air CC: Yearly ROV & CPX... Is Patient Diabetic?  No Pain Assessment Patient in pain? no      Comments meds updated today with pt   Physical Exam  Additional Exam:  WD, overweight, 54 y/o BF in NAD... GENERAL:  Alert & oriented; pleasant & cooperative... HEENT:  [place]/AT, EOM-wnl, PERRLA, EACs-clear, TMs-wnl, NOSE-clear, THROAT-clear & wnl. NECK:  Supple w/ full ROM; no JVD; normal carotid impulses w/o bruits; no thyromegaly or nodules palpated; no lymphadenopathy. CHEST:  Clear to P & A; without wheezes/ rales/ or rhonchi. HEART:  Regular Rhythm; without murmurs/ rubs/ or gallops. ABDOMEN:  Soft & nontender; normal bowel sounds; no organomegaly or masses detected... EXT: without deformities, mild  arthritic changes; no varicose veins/ venous insuffic/ or edema. NEURO:  CN's intact; motor testing normal; sensory testing normal; gait normal & balance OK. DERM:  No lesions noted; no rash etc...    CXR  Procedure date:  01/25/2010  Findings:      CHEST - 2 VIEW Comparison: November 06, 2008   Findings: The cardiac silhouette, mediastinum, pulmonary vasculature are within normal limits.  Both lungs are clear. There is no acute bony abnormality.   IMPRESSION: There is no evidence of acute cardiac or pulmonary process.   Read By:  Dorthula Nettles,  M.D.   MISC. Report  Procedure date:  01/25/2010  Findings:      Lipid Panel (LIPID)   Cholesterol          [H]  265 mg/dL                   8-413   Triglycerides             93.0 mg/dL                  2.4-401.0   HDL                       27.25 mg/dL                 >36.64  Cholesterol LDL - Direct                             142.6 mg/dL  TSH (TSH)   FastTSH                   1.45 uIU/mL                 0.35-5.50  BMP (METABOL)   Sodium                    141 mEq/L                   135-145   Potassium                 4.3 mEq/L                   3.5-5.1   Chloride                  107 mEq/L                   96-112   Carbon Dioxide            26 mEq/L                    19-32   Glucose  98 mg/dL                    16-10   BUN                       15 mg/dL                    9-60   Creatinine                0.6 mg/dL                   4.5-4.0   Calcium                   9.6 mg/dL                   9.8-11.9   GFR                       128.10 mL/min               >60  Hepatic/Liver Function Panel (HEPATIC)   Total Bilirubin           0.4 mg/dL                   1.4-7.8   Direct Bilirubin          0.1 mg/dL                   2.9-5.6   Alkaline Phosphatase      94 U/L                      39-117   AST                       13 U/L                      0-37   ALT                       14 U/L                       0-35   Total Protein             7.0 g/dL                    2.1-3.0   Albumin                   4.0 g/dL                    8.6-5.7  Comments:      CBC Platelet w/Diff (CBCD)   White Cell Count          9.2 K/uL                    4.5-10.5   Red Cell Count            4.88 Mil/uL                 3.87-5.11   Hemoglobin                12.0 g/dL                   84.6-96.0  Hematocrit                37.0 %                      36.0-46.0   MCV                  [L]  75.8 fl                     78.0-100.0   Platelet Count            216.0 K/uL                  150.0-400.0   Neutrophil %              68.3 %                      43.0-77.0   Lymphocyte %              24.0 %                      12.0-46.0   Monocyte %                6.1 %                       3.0-12.0   Eosinophils%              1.3 %                       0.0-5.0   Basophils %               0.3 %                       0.0-3.0   IBC Panel (IBC)   Iron                      80 ug/dL                    95-621   Transferrin               287.3 mg/dL                 308.6-578.4   Iron Saturation      [L]  19.9 %                      20.0-50.0  Angiotensin 1 CE (69629) ! Angiotensin 1 CE          37 U/L                      8-52  Vitamin D (25-Hydroxy) (52841)  Vitamin D (25-Hydroxy)                             37 ng/mL                    30-89   Impression & Recommendations:  Problem # 1:  PHYSICAL EXAMINATION (ICD-V70.0)  Orders: T-2 View CXR (71020TC) FASTING labs done today as well...  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  Problem # 3:  Hx of BRONCHITIS (ICD-490) She continues to smoke 1/2 ppd & we discussed smoking cessation strategies...  Problem # 4:  HYPERLIPIDEMIA (ICD-272.4) Shje has a high HDL but TChol & LDL are not at goal & look worse w/ incr weight... she would like to try to get the weight off & improve her numbers before committing to  medication for her lipids...  Problem # 5:  GI>>> She has hx GERD, IBS, Divertics, Polyps, & +fam hx colon ca... recent colon was OK & they plan f/u colon 62yrs...  Problem # 6:  PANIC DISORDER (ICD-300.01) She is followed by DrMcKinney... Her updated medication list for this problem includes:    Sertraline Hcl 100 Mg Tabs (Sertraline hcl) .Marland Kitchen... Take 150mg  daily    Alprazolam 0.5 Mg Tabs (Alprazolam) .Marland Kitchen... Take 1 tab by mouth three times a day as directed for panic/nerves...  Problem # 7:  OTHER MEDICAL PROBLEMS AS NOTED>>>  Complete Medication List: 1)  Sertraline Hcl 100 Mg Tabs (Sertraline hcl) .... Take 150mg  daily 2)  Alprazolam 0.5 Mg Tabs (Alprazolam) .... Take 1 tab by mouth three times a day as directed for panic/nerves... 3)  Calcium Carbonate-vitamin D 600-400 Mg-unit Tabs (Calcium carbonate-vitamin d) .... Take 1 tablet by mouth two times a day 4)  Multivitamins Tabs (Multiple vitamin) .... Take 1 tablet by mouth once a day 5)  Vitamin D 1000 Unit Tabs (Cholecalciferol) .... Take 1 tablet by mouth once a day  Patient Instructions: 1)  Today we updated your med list- see below.... 2)  Continue your current medications the same... 3)  Congtrats on your up-coming retirement (you are WAY too young to retire!).Marland KitchenMarland Kitchen 4)  Today we did your FASTING blood work & CXR... please call the "phone tree" in a few days for your lab results.Marland KitchenMarland Kitchen 5)  Be sure to get on track w/ your diet & exercise program & get that weight down.... 6)  Call for any problems.Marland KitchenMarland Kitchen 7)  Please schedule a follow-up appointment in 1 year, sooner as needed.

## 2010-03-29 NOTE — Letter (Signed)
Summary: Previsit letter  [facility]  [address] [place], [place] [address]   Phone: [phone]  Fax: [phone]       08/23/2009 MRN: [id number]  Marilyn [name] [address] [place], [place]  [address]  Dear [name],  Welcome to the Gastroenterology Division at [facility].    You are scheduled to see a nurse for your pre-procedure visit on 11-02-09 at 10am, on the 3rd floor at [facility], [address].  We ask that you try to arrive at our office 15 minutes prior to your appointment time to allow for check-in.  Your nurse visit will consist of discussing your medical and surgical history, your immediate family medical history, and your medications.    Please bring a complete list of all your medications or, if you prefer, bring the medication bottles and we will list them.  We will need to be aware of both prescribed and over the counter drugs.  We will need to know exact dosage information as well.  If you are on blood thinners (Coumadin, Plavix, Aggrenox, Ticlid, etc.) please call our office today/prior to your appointment, as we need to consult with your physician about holding your medication.   Please be prepared to read and sign documents such as consent forms, a financial agreement, and acknowledgement forms.  If necessary, and with your consent, a friend or relative is welcome to sit-in on the nurse visit with you.  Please bring your insurance card so that we may make a copy of it.  If your insurance requires a referral to see a specialist, please bring your referral form from your primary care physician.  No co-pay is required for this nurse visit.     If you cannot keep your appointment, please call [phone] to cancel or reschedule prior to your appointment date.  This allows [place] the opportunity to schedule an appointment for another patient in need of care.    Thank you for choosing [facility] for your medical needs.   We appreciate the opportunity to care for you.  Please visit [place] at our website  to learn more about our practice.                     Sincerely.                                                                                                                   The Gastroenterology Division  Appended Document: Previsit letter Letter mailed to patient.

## 2010-03-29 NOTE — Procedures (Signed)
Summary: COLONOSCOPY   Colonoscopy  Procedure date:  11/02/2004  Findings:      Location:   Endoscopy Center.     Patient Name: Marilyn [name], Marilyn [name] MRN:  Procedure Procedures: Colorectal cancer screening, average risk CPT: G0121.  Personnel: Endoscopist: Barbette Hair. Arlyce Dice, MD.  Referred By: Lonzo Cloud. Kriste Basque, MD.  Patient Consent: Procedure, Alternatives, Risks and Benefits discussed, consent obtained, from patient.  Indications  Increased Risk Screening: Family History of Polyps.  History  Current Medications: Patient is not currently taking Coumadin.  Pre-Exam Physical: Performed Nov 02, 2004. Cardio-pulmonary exam, HEENT exam , Abdominal exam, Mental status exam WNL.  Exam Exam: Extent of exam reached: Cecum, extent intended: Cecum.  The cecum was identified by IC valve. Colon retroflexion performed. ASA Classification: I. Tolerance: good.  Monitoring: Pulse and BP monitoring, Oximetry used. Supplemental O2 given. at 2 Liters.  Colon Prep Used Miralax for colon prep. Prep results: good.  Sedation Meds: Patient assessed and found to be appropriate for moderate (conscious) sedation. Sedation was managed by the Endoscopist. Fentanyl 100 mcg. given IV. Versed 8 mg. given IV.  Findings NORMAL EXAM: Cecum.  - NORMAL EXAM: Cecum to Rectum.  NORMAL EXAM: Rectum.   Assessment Normal examination.  Events  Unplanned Interventions: No intervention was required.  Unplanned Events: There were no complications. Plans  Post Exam Instructions: Post sedation instructions given.  Patient Education: Patient given standard instructions for: a normal exam.  Disposition: After procedure patient sent to recovery. After recovery patient sent home.  Scheduling/Referral: Colonoscopy, to Barbette Hair. Arlyce Dice, MD, around Nov 03, 2014.    CC: Lonzo Cloud.Nadel,MD  This report was created from the original endoscopy report, which was reviewed and signed by the above listed  endoscopist.

## 2010-07-15 NOTE — Op Note (Signed)
[facility]  Patient:    Marilyn [name], Marilyn [name]                    MRN: [id number] Proc. Date: 11/25/99 Adm. Date:  [id number] Attending:  Minette Headland                           Operative Report  PREOPERATIVE DIAGNOSES:       Chronic left pelvic pain, previous history of hysterectomy and left salpingo-oophorectomy, suspected adhesions.  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  OPERATIVE PROCEDURE:          Laparoscopy, resection of remnant of ovary of left pelvic sidewall, lysis of right adnexal adhesions and fulguration of adhesions.  SURGEON:                      Freddy Finner, M.D.  ANESTHESIA:                   General endotracheal.  INTRAOPERATIVE COMPLICATIONS:  None.  FINDINGS:                     Intraoperative findings are recorded and still photographs are retained in the office record.  DESCRIPTION OF PROCEDURE:     Patient was admitted on the morning of surgery, brought to the operating room, placed under adequate general anesthesia and placed in the dorsal lithotomy position using the [facility] stirrup system. Betadine prep of abdomen, perineum and vagina was carried out.  Bladder was evacuated with a Robinson catheter.  Sponge forceps were placed in the vaginal canal, with a sponge in the tips for identification purposes during the procedure.  Sterile drapes were applied.  Two small incisions were made, one at the umbilicus and one just above the symphysis.  A 10-mm trocar was introduced at the umbilicus while elevating the anterior abdominal wall manually.  Inspection revealed adequate placement with no evidence of injury on entry.  Pneumoperitoneum was allowed to accumulate with CO2 gas.  Scanning inspection of abdomen and pelvis revealed only the findings noted above as abnormalities.  A second trocar was placed  through the lower incision and through it, a blunt probe used for manipulation during the procedure and later a grasping forceps.  After identification of the lesion on the left pelvic sidewall, it was grasped with an atraumatic grasping forceps.  The base of this area was fulgurated with bipolar forceps and the lesion sharply excised. Additional fulguration then produced complete hemostasis.  The adhesion from the right ovary to the right pelvic sidewall was freed with a blunt probe and then the attachments both on the pelvic sidewall and the ovary were fulgurated with the bipolar forceps.  Inspection under reduced intra-abdominal pressure revealed complete hemostasis.  The procedure was terminated and the instruments removed.  Gas was allowed to escape from the abdomen.  Marcaine 0.5% was injected into the incision sites for postoperative analgesia.  The incisions were closed with interrupted subcuticular sutures of 3-0 Dexon. Steri-Strips were placed on the lower incision.  Patient was taken to recovery in good condition.  She is given Vicodin to be taken as needed for postop pain.  She is to have routine outpatient surgical instructions. DD:  11/25/99 TD:  11/25/99 Job: [id number]

## 2010-11-12 ENCOUNTER — Inpatient Hospital Stay (INDEPENDENT_AMBULATORY_CARE_PROVIDER_SITE_OTHER)
Admission: RE | Admit: 2010-11-12 | Discharge: 2010-11-12 | Disposition: A | Discharge status: Home / Self Care | Payer: PRIVATE HEALTH INSURANCE | Source: Ambulatory Visit | Attending: Family Medicine | Admitting: Family Medicine

## 2010-11-12 DIAGNOSIS — L989 Disorder of the skin and subcutaneous tissue, unspecified: Secondary | ICD-10-CM

## 2010-11-12 DIAGNOSIS — D869 Sarcoidosis, unspecified: Secondary | ICD-10-CM

## 2010-11-12 DIAGNOSIS — L910 Hypertrophic scar: Secondary | ICD-10-CM

## 2010-11-12 DIAGNOSIS — L723 Sebaceous cyst: Secondary | ICD-10-CM

## 2011-01-20 ENCOUNTER — Encounter: Payer: Self-pay | Admitting: Pulmonary Disease

## 2011-01-23 ENCOUNTER — Ambulatory Visit (INDEPENDENT_AMBULATORY_CARE_PROVIDER_SITE_OTHER)
Admission: RE | Admit: 2011-01-23 | Discharge: 2011-01-23 | Disposition: A | Discharge status: Home / Self Care | Payer: PRIVATE HEALTH INSURANCE | Source: Ambulatory Visit | Attending: Pulmonary Disease | Admitting: Pulmonary Disease

## 2011-01-23 ENCOUNTER — Encounter: Payer: Self-pay | Admitting: Pulmonary Disease

## 2011-01-23 ENCOUNTER — Other Ambulatory Visit (INDEPENDENT_AMBULATORY_CARE_PROVIDER_SITE_OTHER): Payer: PRIVATE HEALTH INSURANCE

## 2011-01-23 ENCOUNTER — Other Ambulatory Visit: Payer: Self-pay | Admitting: Pulmonary Disease

## 2011-01-23 ENCOUNTER — Ambulatory Visit (INDEPENDENT_AMBULATORY_CARE_PROVIDER_SITE_OTHER): Payer: PRIVATE HEALTH INSURANCE | Admitting: Pulmonary Disease

## 2011-01-23 VITALS — BP 142/88 | HR 75 | Temp 97.9°F | Ht 65.0 in | Wt 209.6 lb

## 2011-01-23 DIAGNOSIS — D869 Sarcoidosis, unspecified: Secondary | ICD-10-CM

## 2011-01-23 DIAGNOSIS — F410 Panic disorder [episodic paroxysmal anxiety] without agoraphobia: Secondary | ICD-10-CM

## 2011-01-23 DIAGNOSIS — Z0000 Encounter for general adult medical examination without abnormal findings: Secondary | ICD-10-CM

## 2011-01-23 DIAGNOSIS — E785 Hyperlipidemia, unspecified: Secondary | ICD-10-CM

## 2011-01-23 DIAGNOSIS — E559 Vitamin D deficiency, unspecified: Secondary | ICD-10-CM

## 2011-01-23 DIAGNOSIS — K589 Irritable bowel syndrome without diarrhea: Secondary | ICD-10-CM

## 2011-01-23 DIAGNOSIS — D649 Anemia, unspecified: Secondary | ICD-10-CM

## 2011-01-23 DIAGNOSIS — M1990 Unspecified osteoarthritis, unspecified site: Secondary | ICD-10-CM

## 2011-01-23 DIAGNOSIS — I872 Venous insufficiency (chronic) (peripheral): Secondary | ICD-10-CM

## 2011-01-23 DIAGNOSIS — K219 Gastro-esophageal reflux disease without esophagitis: Secondary | ICD-10-CM

## 2011-01-23 DIAGNOSIS — K5730 Diverticulosis of large intestine without perforation or abscess without bleeding: Secondary | ICD-10-CM

## 2011-01-23 DIAGNOSIS — J40 Bronchitis, not specified as acute or chronic: Secondary | ICD-10-CM

## 2011-01-23 DIAGNOSIS — F329 Major depressive disorder, single episode, unspecified: Secondary | ICD-10-CM

## 2011-01-23 DIAGNOSIS — D126 Benign neoplasm of colon, unspecified: Secondary | ICD-10-CM

## 2011-01-23 LAB — BASIC METABOLIC PANEL
CO2: 27 mEq/L (ref 19–32)
GFR: 107.41 mL/min (ref >60.00)
Glucose, Bld: 93 mg/dL (ref 70–99)
Potassium: 4.4 mEq/L (ref 3.5–5.1)
Sodium: 143 mEq/L (ref 135–145)

## 2011-01-23 LAB — CBC WITH DIFFERENTIAL/PLATELET
Basophils Absolute: 0 10*3/uL (ref 0.0–0.1)
Eosinophils Absolute: 0.1 10*3/uL (ref 0.0–0.7)
HCT: 37.4 % (ref 36.0–46.0)
Lymphs Abs: 1.6 10*3/uL (ref 0.7–4.0)
MCHC: 32.3 g/dL (ref 30.0–36.0)
Monocytes Absolute: 0.5 10*3/uL (ref 0.1–1.0)
Monocytes Relative: 5.9 % (ref 3.0–12.0)
Platelets: 246 10*3/uL (ref 150.0–400.0)
RDW: 15.1 % — ABNORMAL HIGH (ref 11.5–14.6)

## 2011-01-23 LAB — HEPATIC FUNCTION PANEL
AST: 16 U/L (ref 0–37)
Albumin: 3.9 g/dL (ref 3.5–5.2)
Alkaline Phosphatase: 83 U/L (ref 39–117)
Bilirubin, Direct: 0 mg/dL (ref 0.0–0.3)

## 2011-01-23 LAB — LIPID PANEL: HDL: 82.9 mg/dL (ref >39.00)

## 2011-01-23 LAB — IBC PANEL: Saturation Ratios: 19.2 % — ABNORMAL LOW (ref 20.0–50.0)

## 2011-01-23 LAB — TSH: TSH: 2.04 u[IU]/mL (ref 0.35–5.50)

## 2011-01-23 NOTE — Progress Notes (Signed)
Subjective:    Patient ID: Marilyn [name], female    DOB: [birth year], 55 y.o.   MRN: [id number]  HPI 55 y/o BF here for a follow up visit... she has multiple medical problems as noted below...   ~  November 06, 2008:  here for CPX- anxiety/ panic much improved w/ counselling and incr Zoloft by Psych, uses the Rebeca Allegra just as needed now... still smoking ~1/2 ppd and failed Chantix prev- we discussed options...  denies CP, palpit, SOB, cough/ phlegm, etc...  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 she had bilat bunion operations by [employer] & has been less active w/ 22# weight gain & we discussed incr exercise (when her boot comes off) & diet therapy... she continues to smoke 1/2 ppd & wants to try nicotine replacement Rx since a relative had success w/ this; f/u CXR remains clear... she saw [name] for f/u colonoscopy 9/11- pos for divertics & 2 polyps= polypoid mucosa, f/u planned 56yrs due to [facility]...  ~  January 23, 2011:  Yearly ROV & CPX> Markan tells me she has had a good yroverall but upset about her weight & is going to a weight loss clinic in [place] taking Phentermine, Lipotropics, B12 shots & HCG injections "I've lost 7# the 1st month"...  She tells me she quit smoking recently, denies cough/ sputum/ hemoptysis/ dyspnea & CXR today remains clear;  BP is borderline at 142/88 but she is asymptomatic w/o CP/ palpit/ SOB/ edema/etc;  Lipids remain abn w/ LDL 148 but she doesn't want to start meds hoping that the weight loss program will help;  See prob list below>>           Problem List:  PHYSICAL EXAMINATION (ICD-V70.0) - GYN= DrNeal who does her PAP smears, Mammograms, and BMD... he also checked her VitD level and started VitD 50K weekly, then changed her to 2000 u daily... she is s/p hysterectomy in 2000... Note- she had an abd bruit investigated w/ an Abd MRA 6/08 and nothing found...  had TETANUS shot 2010 at [facility] after she  stepped on nail...  Hx of BRONCHITIS (ICD-490) - no recent episodes... Min cough & clear sputum... ~  Baseline CXR is clear and WNL (no residuals from her sarcoidosis) ~  Spirometry 7/06 showed FVC= 3.77 (130%), FEV1= 2.71 (113%), %1sec=72, mid-flows=66%...   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 totally resolved on Pred therapy w/ normalization of CXR and no recurrence off Pred now since 1992... ~  CXR 9/10 remains clear & WNL.Marland Kitchen. ~  CXR 11/11 remains clear & WNL.Marland Kitchen. ~  CXR 11/12 remains clear, mild basilar atx, otherw WNL.Marland Kitchen  CIGARETTE SMOKER (ICD-305.1) - she was smoking 1/2 ppd> states she quit 10/12 & doing well since then...  VENOUS INSUFFICIENCY (ICD-459.81) - she follows a low salt diet, elevates when nec, and wears support hose Prn.  HYPERLIPIDEMIA (ICD-272.4) - hx of hyperlipidemia w/ high HDL's prev... ~  FLP 7/07 showed TChol 210, TG 56, HDL 81, LDL 101 ~  FLP 6/08 showed TChol 232, TG 88, HDL 83, LDL 112 ~  FLP 8/09 showed TChol 199, TG 85, HDL 68, LDL 115 ~  FLP 9/10 (wt=175#) showed TChol 231, TG 78, HDL 94, LDL 120 ~  FLP 11/11 (wt=197#) showed TChol 265, TG 93, HDL 89, LDL 143... needs better diet get wt down. ~  FLP 11/12 (wt=210#) showed TChol 238, TG 79, HDL 83, LDL 148... Offered meds but she wants to see how she does on her wt loss program.  GERD (ICD-530.81) - uses OTC Prilosec as needed.  DIVERTICULOSIS OF COLON (ICD-562.10) IRRITABLE BOWEL SYNDROME (ICD-564.1) COLONIC POLYPS (ICD-211.3) Family Hx of COLON CANCER (ICD-153.9) -  ~  colonoscopy 9/06 by Dorris Singh was WNL- no divertics, no polyps... f/u planned 55yrs. ~  Father was diagnosed w/ colon cancer in 07-18-07 & being treated by oncology... pt will need colon f/u in 07/17/2009. ~  f/u colonoscopy 9/11 by [name] showed divertics & 2 polyps= polypoid mucosa, f/u planned 55yrs.  Hx of UTI (ICD-599.0)  DEGENERATIVE JOINT DISEASE  (ICD-715.90) - she has mild DJD esp in knees & prev had cortisone shots from ortho- DrMcKinley... not on regular meds, she uses Tylenol Prn...  VITAMIN D DEFICIENCY (ICD-268.9) - followed by DrNeal & currently taking Vit D 1000 u caps- 2 daily. ~  Labs here 11/12 showed Vit D level = 37  HEADACHE (ICD-784.0)  Hx Panic Disorder> Improved w/o attacks in several yrs; she has ALPRAZOLAM0.5mg  for prn use but seldom needs it she says... Hx of DEPRESSION (ICD-311) - husb passed away in 2003/07/18... she sees DrMcKinney Financial risk analyst) on ZOLOFT 100mg /d...  Hx of ANEMIA (ICD-285.9) - Hg chronically in the 11-12 range... ~  labs 9/10 showed Hg= 11.9, MCV= 77, Fe= 76 (21%sat) ~  labs 11/11 showed Hg= 12.0, MCV= 76, Fe= 80 (20%sat) ~  Labs 11/12 showed Hg= 12.1, MCV= 75, Fe=68 (19%sat)   Past Surgical History  Procedure Date  . Total abdominal hysterectomy 07-18-1998    Dr Jennette Kettle  . Trigger finger release 2006-07-18    Dr Mina Marble  . Bunionectomy 07/17/2009    bilateral by Dr Celene Skeen    Outpatient Encounter Prescriptions as of 01/23/2011  Medication Sig Dispense Refill  . ALPRAZolam (XANAX) 0.5 MG tablet Take 0.5 mg by mouth 3 (three) times daily as needed.        . Calcium-Vitamin D 600-200 MG-UNIT per tablet Take 1 tablet by mouth 2 (two) times daily.        . cholecalciferol (VITAMIN D) 1000 UNITS tablet Take 1,000 Units by mouth daily.        . cyanocobalamin (,VITAMIN B-12,) 1000 MCG/ML injection Injection once weekly       . Multiple Vitamin (MULTIVITAMIN) tablet Take 1 tablet by mouth daily.        . phentermine 37.5 MG capsule Take 37.5 mg by mouth every morning.    on DIET PILLS from wt loss center in [place]    . sertraline (ZOLOFT) 100 MG tablet Take 100 mg by mouth daily.         Allergies  Allergen Reactions  . Penicillins     REACTION: rash    Current Medications, Allergies, Past Medical History, Past Surgical History, Family History, and Social History were reviewed in [employer] record.    Review of Systems    The patient notes some DOE- improved from last yr.  She denies fever, chills, sweats, anorexia, fatigue, weakness, malaise, weight loss, sleep disorder, blurring, diplopia, eye irritation, eye discharge, vision loss, eye pain, photophobia, earache, ear discharge, tinnitus, decreased hearing, nasal congestion, nosebleeds, sore throat, hoarseness, chest pain, palpitations, syncope, orthopnea, PND, peripheral edema, cough, dyspnea at rest, excessive sputum, hemoptysis, wheezing, pleurisy, nausea, vomiting, diarrhea, constipation, change in bowel habits, abdominal pain, melena, hematochezia, jaundice, gas/bloating, indigestion/heartburn, dysphagia, odynophagia, dysuria, hematuria, urinary  frequency, urinary hesitancy, nocturia, incontinence, back pain, joint pain, joint swelling, muscle cramps, muscle weakness, stiffness, arthritis, sciatica, restless legs, leg pain at night, leg pain with exertion, rash, itching, dryness, suspicious lesions, paralysis, paresthesias, seizures, tremors, vertigo, transient blindness, frequent falls, frequent headaches, difficulty walking, depression, anxiety, memory loss, confusion, cold intolerance, heat intolerance, polydipsia, polyphagia, polyuria, unusual weight change, abnormal bruising, bleeding, enlarged lymph nodes, urticaria, allergic rash, hay fever, and recurrent infections.     Objective:   Physical Exam     WD, overweight, 55 y/o BF in NAD... GENERAL:  Alert & oriented; pleasant & cooperative... HEENT:  [place]/AT, EOM-wnl, PERRLA, EACs-clear, TMs-wnl, NOSE-clear, THROAT-clear & wnl. NECK:  Supple w/ full ROM; no JVD; normal carotid impulses w/o bruits; no thyromegaly or nodules palpated; no lymphadenopathy. CHEST:  Clear to P & A; without wheezes/ rales/ or rhonchi. HEART:  Regular Rhythm; without murmurs/ rubs/ or gallops. ABDOMEN:  Soft & nontender; normal bowel sounds; no organomegaly or masses  detected... EXT: without deformities, mild arthritic changes; no varicose veins/ venous insuffic/ or edema. NEURO:  CN's intact; motor testing normal; sensory testing normal; gait normal & balance OK. DERM:  No lesions noted; no rash etc...  RADIOLOGY DATA:  Reviewed in the EPIC EMR & discussed w/ the patient...  LABORATORY DATA:  Reviewed in the EPIC EMR & discussed w/ the patient...   Assessment & Plan:   CPX>  CXR, EKG, Labs- all reviewed w/ pt in office & via phone tree...  Ex-smoker, Hx Bronchits, Hx Sarcoid>  CXR remains stable, essent clear, no signs of her prev alveolar sarcoidosis; she quit smoking 10/12 she says...  Ven Insuffic>  Aware- avoid sodium, elevate, support hose...  Hyperlipid>  Poorly controlled on diet alone yet she doesn't want meds & hopeful that new diet plan will help this as well as her weight...  GI> GERD, Divertics, IBS, Polyps>  Stable on Rx, followed by [name] & up to date...  DJD>  She uses OTC analgesics as needed; notes incr symptoms w/ wt gain & hoping for improvement on diet plan...  Hx Depression>  She is followed by psychiatrist as noted, on Zoloft & prn Rebeca Allegra which she seldom takes...  Hx Anemia>  Hg stable ~12 w/ sm cells & Fe levels wnl; poss Thalassemia minor, it still wouldn't hurt to take Fe daily supplement.Marland KitchenMarland Kitchen

## 2011-01-23 NOTE — Patient Instructions (Signed)
Today we updated your med list in our EPIC system...    Continue your current medications the same...  Today we did your follow up CXR, EKG, & fasting blood work...    Please call the PHONE TREE in a few days for your results...    Dial [id number] & when prompted enter your patient number followed by the # symbol...    Your patient number is:  [id number]#  Good luck w/ the weight loss program...    Be sure to increase your exercise as well...  Call for any questions...  Let's plan a follow up visit in 1 yrs time, sooner if needed for problems.Marland KitchenMarland Kitchen

## 2011-01-27 ENCOUNTER — Encounter: Payer: Self-pay | Admitting: Pulmonary Disease

## 2011-02-28 HISTORY — PX: BREAST LUMPECTOMY: SHX2

## 2011-05-17 ENCOUNTER — Telehealth: Payer: Self-pay | Admitting: Pulmonary Disease

## 2011-05-17 DIAGNOSIS — E785 Hyperlipidemia, unspecified: Secondary | ICD-10-CM

## 2011-05-17 NOTE — Telephone Encounter (Signed)
Pt had labs done 12/2010 and SN recs for her to start on meds.  Pt declined at that time and was doing diet and exercise.  Pt wanted to come back for her levels to be rechecked,   Orders have been placed for the pt and pt is aware.

## 2011-05-17 NOTE — Telephone Encounter (Signed)
I spoke with pt and she states that SN wanted her to call and have her labs rechecked in a couple months from the last time she had this done in November. I do not see where it says for her to return. Please advise Dr. Kriste Basque, thanks

## 2011-05-18 ENCOUNTER — Other Ambulatory Visit (INDEPENDENT_AMBULATORY_CARE_PROVIDER_SITE_OTHER): Payer: PRIVATE HEALTH INSURANCE

## 2011-05-18 DIAGNOSIS — E785 Hyperlipidemia, unspecified: Secondary | ICD-10-CM

## 2011-05-18 LAB — LIPID PANEL
HDL: 84.7 mg/dL (ref >39.00)
Total CHOL/HDL Ratio: 3
Triglycerides: 89 mg/dL (ref 0.0–149.0)
VLDL: 17.8 mg/dL (ref 0.0–40.0)

## 2011-05-18 LAB — HEPATIC FUNCTION PANEL
ALT: 20 U/L (ref 0–35)
Bilirubin, Direct: 0 mg/dL (ref 0.0–0.3)
Total Bilirubin: 0.4 mg/dL (ref 0.3–1.2)

## 2011-05-18 LAB — LDL CHOLESTEROL, DIRECT: Direct LDL: 130.3 mg/dL

## 2011-05-31 ENCOUNTER — Other Ambulatory Visit: Payer: Self-pay | Admitting: Obstetrics & Gynecology

## 2011-05-31 DIAGNOSIS — R928 Other abnormal and inconclusive findings on diagnostic imaging of breast: Secondary | ICD-10-CM

## 2011-06-06 ENCOUNTER — Ambulatory Visit
Admission: RE | Admit: 2011-06-06 | Discharge: 2011-06-06 | Disposition: A | Discharge status: Home / Self Care | Payer: PRIVATE HEALTH INSURANCE | Source: Ambulatory Visit | Attending: Obstetrics & Gynecology | Admitting: Obstetrics & Gynecology

## 2011-06-06 ENCOUNTER — Other Ambulatory Visit: Payer: Self-pay | Admitting: Obstetrics & Gynecology

## 2011-06-06 DIAGNOSIS — T7840XA Allergy, unspecified, initial encounter: Secondary | ICD-10-CM | POA: Insufficient documentation

## 2011-06-06 DIAGNOSIS — R928 Other abnormal and inconclusive findings on diagnostic imaging of breast: Secondary | ICD-10-CM

## 2011-06-06 DIAGNOSIS — C50919 Malignant neoplasm of unspecified site of unspecified female breast: Secondary | ICD-10-CM

## 2011-06-06 HISTORY — DX: Malignant neoplasm of unspecified site of unspecified female breast: C50.919

## 2011-06-06 HISTORY — PX: BREAST BIOPSY: SHX20

## 2011-06-08 ENCOUNTER — Other Ambulatory Visit: Payer: Self-pay | Admitting: Obstetrics & Gynecology

## 2011-06-08 ENCOUNTER — Ambulatory Visit
Admission: RE | Admit: 2011-06-08 | Discharge: 2011-06-08 | Disposition: A | Discharge status: Home / Self Care | Payer: PRIVATE HEALTH INSURANCE | Source: Ambulatory Visit | Attending: Obstetrics & Gynecology | Admitting: Obstetrics & Gynecology

## 2011-06-08 DIAGNOSIS — C50912 Malignant neoplasm of unspecified site of left female breast: Secondary | ICD-10-CM

## 2011-06-08 DIAGNOSIS — R928 Other abnormal and inconclusive findings on diagnostic imaging of breast: Secondary | ICD-10-CM

## 2011-06-09 ENCOUNTER — Telehealth: Payer: Self-pay | Admitting: *Deleted

## 2011-06-09 ENCOUNTER — Other Ambulatory Visit: Payer: Self-pay | Admitting: *Deleted

## 2011-06-09 DIAGNOSIS — C50412 Malignant neoplasm of upper-outer quadrant of left female breast: Secondary | ICD-10-CM | POA: Insufficient documentation

## 2011-06-09 DIAGNOSIS — Z170 Estrogen receptor positive status [ER+]: Secondary | ICD-10-CM | POA: Insufficient documentation

## 2011-06-09 DIAGNOSIS — C50419 Malignant neoplasm of upper-outer quadrant of unspecified female breast: Secondary | ICD-10-CM

## 2011-06-09 NOTE — Telephone Encounter (Signed)
Confirmed [facility] for 06/14/11 at 0800 .  Instructions and contact information given.

## 2011-06-13 ENCOUNTER — Ambulatory Visit
Admission: RE | Admit: 2011-06-13 | Discharge: 2011-06-13 | Disposition: A | Discharge status: Home / Self Care | Payer: PRIVATE HEALTH INSURANCE | Source: Ambulatory Visit | Attending: Obstetrics & Gynecology | Admitting: Obstetrics & Gynecology

## 2011-06-13 DIAGNOSIS — C50912 Malignant neoplasm of unspecified site of left female breast: Secondary | ICD-10-CM

## 2011-06-13 MED ORDER — GADOBENATE DIMEGLUMINE 529 MG/ML IV SOLN
20.0000 mL | Freq: Once | INTRAVENOUS | Status: AC | PRN
  Administered 2011-06-13: 20 mL via INTRAVENOUS

## 2011-06-14 ENCOUNTER — Ambulatory Visit: Payer: PRIVATE HEALTH INSURANCE

## 2011-06-14 ENCOUNTER — Encounter: Payer: Self-pay | Admitting: *Deleted

## 2011-06-14 ENCOUNTER — Encounter: Payer: Self-pay | Admitting: Specialist

## 2011-06-14 ENCOUNTER — Encounter: Payer: Self-pay | Admitting: Oncology

## 2011-06-14 ENCOUNTER — Ambulatory Visit (HOSPITAL_BASED_OUTPATIENT_CLINIC_OR_DEPARTMENT_OTHER): Payer: PRIVATE HEALTH INSURANCE | Admitting: Oncology

## 2011-06-14 ENCOUNTER — Ambulatory Visit (HOSPITAL_BASED_OUTPATIENT_CLINIC_OR_DEPARTMENT_OTHER): Payer: PRIVATE HEALTH INSURANCE | Admitting: General Surgery

## 2011-06-14 ENCOUNTER — Ambulatory Visit: Payer: PRIVATE HEALTH INSURANCE | Attending: General Surgery | Admitting: Physical Therapy

## 2011-06-14 ENCOUNTER — Other Ambulatory Visit (HOSPITAL_BASED_OUTPATIENT_CLINIC_OR_DEPARTMENT_OTHER): Payer: PRIVATE HEALTH INSURANCE | Admitting: Lab

## 2011-06-14 ENCOUNTER — Encounter: Payer: Self-pay | Admitting: Radiation Oncology

## 2011-06-14 ENCOUNTER — Ambulatory Visit
Admission: RE | Admit: 2011-06-14 | Discharge: 2011-06-14 | Disposition: A | Discharge status: Home / Self Care | Payer: PRIVATE HEALTH INSURANCE | Source: Ambulatory Visit | Attending: Radiation Oncology | Admitting: Radiation Oncology

## 2011-06-14 VITALS — BP 145/85 | HR 77 | Temp 97.9°F | Ht 65.0 in | Wt 223.5 lb

## 2011-06-14 DIAGNOSIS — C50419 Malignant neoplasm of upper-outer quadrant of unspecified female breast: Secondary | ICD-10-CM

## 2011-06-14 DIAGNOSIS — M25569 Pain in unspecified knee: Secondary | ICD-10-CM | POA: Insufficient documentation

## 2011-06-14 DIAGNOSIS — C50519 Malignant neoplasm of lower-outer quadrant of unspecified female breast: Secondary | ICD-10-CM

## 2011-06-14 DIAGNOSIS — M25619 Stiffness of unspecified shoulder, not elsewhere classified: Secondary | ICD-10-CM | POA: Insufficient documentation

## 2011-06-14 DIAGNOSIS — C50919 Malignant neoplasm of unspecified site of unspecified female breast: Secondary | ICD-10-CM

## 2011-06-14 DIAGNOSIS — D869 Sarcoidosis, unspecified: Secondary | ICD-10-CM

## 2011-06-14 DIAGNOSIS — Z170 Estrogen receptor positive status [ER+]: Secondary | ICD-10-CM

## 2011-06-14 DIAGNOSIS — Z803 Family history of malignant neoplasm of breast: Secondary | ICD-10-CM

## 2011-06-14 DIAGNOSIS — IMO0001 Reserved for inherently not codable concepts without codable children: Secondary | ICD-10-CM | POA: Insufficient documentation

## 2011-06-14 DIAGNOSIS — C50319 Malignant neoplasm of lower-inner quadrant of unspecified female breast: Secondary | ICD-10-CM

## 2011-06-14 LAB — CBC WITH DIFFERENTIAL/PLATELET
BASO%: 0.5 % (ref 0.0–2.0)
EOS%: 1.8 % (ref 0.0–7.0)
MCH: 23.4 pg — ABNORMAL LOW (ref 25.1–34.0)
MCHC: 31.1 g/dL — ABNORMAL LOW (ref 31.5–36.0)
MCV: 75.3 fL — ABNORMAL LOW (ref 79.5–101.0)
MONO%: 8.4 % (ref 0.0–14.0)
RBC: 4.79 10*6/uL (ref 3.70–5.45)
RDW: 14.3 % (ref 11.2–14.5)
lymph#: 2.2 10*3/uL (ref 0.9–3.3)
nRBC: 0 % (ref 0–0)

## 2011-06-14 LAB — COMPREHENSIVE METABOLIC PANEL
ALT: 14 U/L (ref 0–35)
AST: 13 U/L (ref 0–37)
Alkaline Phosphatase: 90 U/L (ref 39–117)
Glucose, Bld: 82 mg/dL (ref 70–99)
Potassium: 4.1 mEq/L (ref 3.5–5.3)
Sodium: 136 mEq/L (ref 135–145)
Total Bilirubin: 0.2 mg/dL — ABNORMAL LOW (ref 0.3–1.2)
Total Protein: 7.1 g/dL (ref 6.0–8.3)

## 2011-06-14 LAB — CANCER ANTIGEN 27.29: CA 27.29: 15 U/mL (ref 0–39)

## 2011-06-14 NOTE — Progress Notes (Signed)
I met with the patient and several family members in the [facility].  In reviewing the distress screening tool with her, she indicated that her stress level had decreased since meeting with the physicians today and having a plan.  She has a sister who was previously treated for breast cancer, and her sister is very supportive.  I told her about the support services at the [facility], including the Breast Cancer Support Group and [facility].

## 2011-06-14 NOTE — Progress Notes (Signed)
Subjective:   new diagnosis cancer of the left breast  Patient ID: Marilyn [name], female   DOB: 1956-02-13, 56 y.o.   MRN: [id number]  HPI Patient is a very pleasant 56 year old female seen in the breast multidisciplinary clinic, referred by Dr. Whitney Muse at the breast imaging Center for a new diagnosis of left breast cancer. The patient has a family history of her sister developing breast cancer in her 30s and therefore has been very compliant with yearly screening mammograms. She recently presented for her routine screening mammogram. She had not noted any lump in her breast or nipple discharge or skin change or other unusual symptom. Noted in the left breast at the 3:00 position was an approximately 1 cm suspicious spiculated mass. This was confirmed by ultrasound measuring 7 mm. Ultrasound guided core biopsy was recommended and performed. 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 Subsequent bilateral breast MR was performed which confirmed a mass in the lower outer quadrant of the left breast posteriorly measuring 1.2 cm in greatest dimension. There were no other masses in either breast and no apparent adenopathy. The patient is here for further treatment planning.  Past Medical History  Diagnosis Date  . History of bronchitis   . History of sarcoidosis   . Cigarette smoker   . Venous insufficiency   . Hyperlipidemia   . GERD (gastroesophageal reflux disease)   . Diverticulosis of colon   . IBS (irritable bowel syndrome)   . Colon polyp   . History of UTI   . DJD (degenerative joint disease)   . Vitamin d deficiency   . HA (headache)   . Panic disorder   . Depression   . History of anemia    Past Surgical History  Procedure Date  . Total abdominal hysterectomy 2000    Dr Jennette Kettle  . Trigger finger release 2008    Dr Mina Marble  . Bunionectomy 2011    bilateral by Dr Celene Skeen  . Tonsillectomy 1972  . Cyst removed from  right hand 1995   .cmd Allergies  Allergen Reactions  . Penicillins Rash    REACTION: rash   History  Substance Use Topics  . Smoking status: Former Smoker -- 0.5 packs/day    Types: Cigarettes    Quit date: 12/25/2010  . Smokeless tobacco: Not on file  . Alcohol Use: 0.0 oz/week    0-1 drink(s) per week     social   family history includes Anemia in her mother; Breast cancer in her sister; Cancer in her brother, cousin, and father; Cancer (age of onset:30) in her sister; Colon cancer in her father; Hyperlipidemia in her brother; Hypertension in her brother; Multiple sclerosis in her sister; Other in her mother; Prostate cancer in her brother and father; and Sarcoidosis in her sister.   Review of Systems  Constitutional: Negative.   HENT: Positive for congestion, rhinorrhea and postnasal drip.   Eyes: Negative.   Respiratory: Negative.   Cardiovascular: Negative.   Gastrointestinal: Negative.        Colonoscopy is up-to-date  Musculoskeletal: Positive for arthralgias.  Psychiatric/Behavioral: Negative.        Objective:   Physical Exam General: Alert, well-developed African American female, in no distress Skin: Warm and dry without rash or infection. HEENT: No palpable masses or thyromegaly. Sclera nonicteric. Pupils equal round and reactive. Oropharynx clear. Lymph nodes: No cervical, supraclavicular, or inguinal nodes palpable. Breasts: There is some slight bruising in  thickening at the biopsy site in the lower outer left breast. No definite masses. Healed old incisions upper outer quadrants bilaterally. No skin changes or nipple inversion. Lungs: Breath sounds clear and equal without increased work of breathing Cardiovascular: Regular rate and rhythm without murmur. No JVD or edema. Peripheral pulses intact. Abdomen: Nondistended. Soft and nontender. No masses palpable. No organomegaly. No palpable hernias. Extremities: No edema or joint swelling or deformity. No  chronic venous stasis changes. Neurologic: Alert and fully oriented. Gait normal.     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 We discussed the surgical treatment options in detail. I believe she would be a good candidate for breast conservation which is what she would prefer. We discussed needle localized lumpectomy and axillary sentinel lymph node biopsy. The nature of the procedure, recovery, risks of bleeding, infection, anesthetic risk, and possible need for further surgery based on final pathology were discussed and understood. We discussed the likely need for postoperative radiation and possible adjuvant treatments. All her questions were answered. She will be undergoing genetic screening due to her family history and we will plan her surgery out beyond 2 weeks so that we have these results prior to proceeding.    Plan:     Needle localized left breast lumpectomy and left axillary sentinel lymph node biopsy under general anesthesia as an outpatient pending her genetic screening.

## 2011-06-14 NOTE — Progress Notes (Signed)
Marilyn [name] [id number] 1955-04-23 57 y.o. 06/14/2011 12:08 PM  CC  NADEL,SCOTT Judie Petit, MD, MD [facility] [address] [place] [address] [place] [address] Dr. Glenna Fellows Dr. Lonie Peak Dr. 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 Patient is being seen in the multidisciplinary breast clinic for discussion of treatment options.  STAGE:   Cancer of upper-outer quadrant of female breast   Primary site: Breast (Left)   Staging method: AJCC 7th Edition   Clinical: Stage IA (T1c, N0, cM0)   Summary: Stage IA (T1c, N0, cM0)  REFERRING PHYSICIAN: Dr. Sharlet Salina Hoxworth  HISTORY OF PRESENT ILLNESS:  Marilyn [name] is a 56 y.o. female.   With past medical history significant for sarcoidosis hyperlipidemia venous insufficiency gastroesophageal reflux disease. Patient recently presented for her annual mammogram. On the mammogram she was noted to have a left breast mass. She went on to have an ultrasound performed that showed an 8 mm mass. She had an ultrasound-guided core needle biopsy performed the pathology showed an invasive ductal carcinoma grade 1 with associated DCIS. Tumor was ER +100% PR +100% proliferation marker 6%. HER-2/neu was not amplified with a ratio of 1.08. Patient then had an MRI of the breasts performed that showed the mass to be 1.2 cm x 0.9 x 1.2 corresponding to the biopsy-proven malignancy. This was in the lower outer quadrant of the left breast. No MRI specific evidence of malignancy was noted in the right breast. No axillary or internal mammary adenopathy was noted. Patient is now seen in the multidisciplinary breast clinic for discussion of treatment options. Her case was discussed at the multidisciplinary breast conference this morning.   Past Medical History: Past Medical History  Diagnosis Date    . History of bronchitis   . History of sarcoidosis   . Cigarette smoker   . Venous insufficiency   . Hyperlipidemia   . GERD (gastroesophageal reflux disease)   . Diverticulosis of colon   . IBS (irritable bowel syndrome)   . Colon polyp   . History of UTI   . DJD (degenerative joint disease)   . Vitamin d deficiency   . HA (headache)   . Panic disorder   . Depression   . History of anemia     Past Surgical History: Past Surgical History  Procedure Date  . Total abdominal hysterectomy 2000    Dr Jennette Kettle  . Trigger finger release 2008    Dr Mina Marble  . Bunionectomy 2011    bilateral by Dr Celene Skeen  . Tonsillectomy 1972  . Cyst removed from right hand 1995    Family History: Family History  Problem Relation Age of Onset  . Anemia Mother   . Other Mother     + H Pylori  . Prostate cancer Father   . Colon cancer Father     diagnosed 2009  . Cancer Father     colon and prostate cancer  . Breast cancer Sister     #1  . Cancer Sister 30    breast cancer  . Sarcoidosis Sister     #2  . Multiple sclerosis Sister     #2  . Hypertension Brother     #1  . Cancer Brother     prostate cancer  . Prostate cancer Brother     #1  . Hyperlipidemia Brother     #  1  . Cancer Cousin     breast cancer    Social History History  Substance Use Topics  . Smoking status: Former Smoker -- 0.5 packs/day    Types: Cigarettes    Quit date: 12/25/2010  . Smokeless tobacco: Not on file  . Alcohol Use: 0.0 oz/week    0-1 drink(s) per week     social    Allergies: Allergies  Allergen Reactions  . Penicillins Rash    REACTION: rash    Current Medications: Current Outpatient Prescriptions  Medication Sig Dispense Refill  . Cinnamon 500 MG TABS Take by mouth.      . loratadine (CLARITIN REDITABS) 10 MG dissolvable tablet Take 10 mg by mouth daily.      . NON FORMULARY Inject 1 each as directed. HCG- Lipotropics Injection - last used in March      . ALPRAZolam (XANAX) 0.5  MG tablet Take 0.5 mg by mouth 3 (three) times daily as needed.        . Calcium-Vitamin D 600-200 MG-UNIT per tablet Take 1 tablet by mouth 2 (two) times daily.        . cholecalciferol (VITAMIN D) 1000 UNITS tablet Take 1,000 Units by mouth daily.        . cyanocobalamin (,VITAMIN B-12,) 1000 MCG/ML injection Injection once weekly       . dextromethorphan-guaiFENesin (MUCINEX DM) 30-600 MG per 12 hr tablet Take 2 tablets by mouth every 12 (twelve) hours.        . Multiple Vitamin (MULTIVITAMIN) tablet Take 1 tablet by mouth daily.        . phentermine 37.5 MG capsule Take 37.5 mg by mouth every morning.        . sertraline (ZOLOFT) 100 MG tablet Take 100 mg by mouth daily.         OB/GYN History:Menarche at age 21 she underwent menopause in September 2000. She had been on hormone replacement therapy using the patch for about a year but she cannot remember when she stopped it. She has not had any term Burkitt's. She had her colonoscopy in September 2011 bone density 05/29/2011 last Pap smear 03/06/2011 first mammogram began in 1982.  ECOG PERFORMANCE STATUS: 0 - Asymptomatic  Genetic Counseling/testing: Patient's family history was reviewed very carefully and thoroughly her father had prostate and colon cancer at the age of 74 patient had a brother at with prostate cancer in his 43s. Patient's one sister had breast cancer in her 30s and then subsequent developed early stage lung cancer. She is alive at 47. Patient had a paternal cousin with breast cancer in her 10s who is since deceased. Because of this genetic counseling and testing was offered an appointment will be set up with our genetic counselor.  REVIEW OF SYSTEMS:  Constitutional: positive for fatigue Ears, nose, mouth, throat, and face: positive for nasal congestion Respiratory: negative Cardiovascular: negative Gastrointestinal: negative Integument/breast: positive for breast tenderness Hematologic/lymphatic:  negative Musculoskeletal:negative Neurological: negative  PHYSICAL EXAMINATION: Blood pressure 145/85, pulse 77, temperature 97.9 F (36.6 C), height 5\' 5"  (1.651 m), weight 223 lb 8 oz (101.379 kg).  ZHY:QMVHQ, healthy, no distress, well nourished and well developed SKIN: skin color, texture, turgor are normal HEAD: Normocephalic EYES: PERRLA, EOMI, Conjunctiva are pink and non-injected, sclera clear EARS: External ears normal OROPHARYNX:no exudate, no erythema and lips, buccal mucosa, and tongue normal  NECK: supple, no adenopathy, thyroid normal size, non-tender, without nodularity LYMPH:  no palpable lymphadenopathy, no hepatosplenomegaly BREAST:Bilateral breast examination  is performed left breast does reveal fullness since biopsy. There is an area of ecchymosis. Right breast without any masses or nipple discharge. LUNGS: clear to auscultation and percussion HEART: regular rate & rhythm, no murmurs and no gallops ABDOMEN:abdomen soft, non-tender, normal bowel sounds and no masses or organomegaly BACK: Back symmetric, no curvature., No CVA tenderness EXTREMITIES:no edema, no clubbing, no cyanosis  NEURO: alert & oriented x 3 with fluent speech, no focal motor/sensory deficits, gait normal   STUDIES/RESULTS: US Breast Left  06-12-2011  *RADIOLOGY REPORT*  Clinical Data:  Screening callback for questioned bilateral breast masses  DIGITAL DIAGNOSTIC BILATERAL MAMMOGRAM WITH CAD AND LEFT BREAST ULTRASOUND:  Comparison:  Prior exams  Findings:  In the right medial breast, there is no persistent abnormality at the site of the questioned mammographic mass.  In the left breast three o'clock location, there is a spiculated irregular mass measuring approximately 1 cm, corresponding to the mammographic finding. Mammographic images were processed with CAD.  On physical exam, I palpate nodularity in the left lateral breast but no focal mass.  Ultrasound is performed, showing a hypoechoic angulated  irregular shadowing mass, taller than wide, in the left breast three o'clock location 6 cm from the nipple, measuring 0.8 x 0.8 x 0.7 cm.  This corresponds to the mammographic finding.  IMPRESSION: Suspicious left breast mass 3 o'clock location.  Ultrasound-guided core biopsy will be performed today and dictated separately. A message was also left at [facility] by Dr. Chilton Si at the time of imaging.  No persistent abnormality in the right medial breast on additional imaging today.  Findings and recommendations discussed with the patient and provided in written form at the time of the exam.  BI-RADS CATEGORY 4:  Suspicious abnormality - biopsy should be considered.  Recommendation:  Ultrasound guided left breast biopsy  Original Report Authenticated By: Harrel Lemon, M.D.   Mr Breast Bilateral W Wo Contrast  06/13/2011  *RADIOLOGY REPORT*  Clinical Data: New diagnosis left sided breast cancer  BILATERAL BREAST MRI WITH AND WITHOUT CONTRAST  Technique: Multiplanar, multisequence MR images of both breasts were obtained prior to and following the intravenous administration of 20 ml of Multihance.  Three dimensional images were evaluated at the independent DynaCad workstation.  Comparison:  Mammogram dated 05/29/2011  Findings: An oval, heterogeneously enhancing mass with irregular margins is imaged in the lower outer quadrant of the left breast, posteriorly measuring 1.2 x 0.9 x 1.2 cm corresponding to the biopsy-proven malignancy.  Biopsy clip is seen in association with the mass.  Edema is seen extending posterior to the pectoralis muscle without associated enhancement.  No other suspicious mass or enhancement is seen in either breast.  There is no axillary or internal mammary adenopathy.  IMPRESSION: Mass, left breast corresponding to the biopsy-proven malignancy. No MRI specific evidence of malignancy, right breast.  THREE-DIMENSIONAL MR IMAGE RENDERING ON INDEPENDENT WORKSTATION:  Three-dimensional  MR images were rendered by post-processing of the original MR data on an independent workstation.  The three- dimensional MR images were interpreted, and findings were reported in the accompanying complete MRI report for this study.  BI-RADS CATEGORY 6:  Known biopsy-proven malignancy - appropriate action should be taken.  Original Report Authenticated By: Hiram Gash, M.D.   [place] Core Biopsy  2011/06/12  *RADIOLOGY REPORT*  Clinical Data:  Suspicious left breast mass 3 o'clock location  ULTRASOUND GUIDED VACUUM ASSISTED CORE BIOPSY OF THE LEFT BREAST  Comparison: Prior exams  I met with the  patient and we discussed the procedure of ultrasound- guided biopsy, including benefits and alternatives.  We discussed the high likelihood of a successful procedure. We discussed the risks of the procedure, including infection, bleeding, tissue injury, clip migration, and inadequate sampling.  Informed, written consent was given.  Pre-procedure ultrasound demonstrates no left axillary lymphadenopathy.  Using sterile technique, 2% lidocaine, ultrasound guidance, and a 12 gauge vacuum assisted needle, biopsy was performed of left breast mass 3 o'clock location using a lateral to medial approach. Three core samples were obtained.  At the conclusion of the procedure, a tissue marker clip was deployed into the biopsy cavity.  Follow-up 2-view mammogram was performed and dictated separately.  IMPRESSION: Ultrasound-guided biopsy of left breast mass 3 o'clock location, with clip placement.  Pathology is pending.  No apparent complications.  Original Report Authenticated By: Harrel Lemon, M.D.   Mm Digital Diagnostic Bilat  06/06/2011  *RADIOLOGY REPORT*  Clinical Data:  Screening callback for questioned bilateral breast masses  DIGITAL DIAGNOSTIC BILATERAL MAMMOGRAM WITH CAD AND LEFT BREAST ULTRASOUND:  Comparison:  Prior exams  Findings:  In the right medial breast, there is no persistent abnormality at the site of the  questioned mammographic mass.  In the left breast three o'clock location, there is a spiculated irregular mass measuring approximately 1 cm, corresponding to the mammographic finding. Mammographic images were processed with CAD.  On physical exam, I palpate nodularity in the left lateral breast but no focal mass.  Ultrasound is performed, showing a hypoechoic angulated irregular shadowing mass, taller than wide, in the left breast three o'clock location 6 cm from the nipple, measuring 0.8 x 0.8 x 0.7 cm.  This corresponds to the mammographic finding.  IMPRESSION: Suspicious left breast mass 3 o'clock location.  Ultrasound-guided core biopsy will be performed today and dictated separately. A message was also left at [facility] by Dr. Chilton Si at the time of imaging.  No persistent abnormality in the right medial breast on additional imaging today.  Findings and recommendations discussed with the patient and provided in written form at the time of the exam.  BI-RADS CATEGORY 4:  Suspicious abnormality - biopsy should be considered.  Recommendation:  Ultrasound guided left breast biopsy  Original Report Authenticated By: Harrel Lemon, M.D.   Mm Digital Diagnostic Unilat L  06/06/2011  *RADIOLOGY REPORT*  Clinical Data:  Suspicious left breast mass 3 o'clock location, status post biopsy with clip placement  DIGITAL DIAGNOSTIC LEFT MAMMOGRAM  Comparison:  Prior exams  Findings:  Films are performed following ultrasound guided biopsy of left breast mass 3 o'clock location.  The ribbon shaped clip is appropriately positioned at the mammographically evident mass in the left breast three o'clock location.  IMPRESSION: Appropriate ribbon shaped clip location at the site of the biopsied mass in the left breast three o'clock location.  Original Report Authenticated By: Harrel Lemon, M.D.   Mm Radiologist Eval And Mgmt  06/08/2011  *RADIOLOGY REPORT*  ESTABLISHED PATIENT OFFICE VISIT - LEVEL II [phone])   Chief Complaint:  The patient returns for biopsy results of a left breast ultrasound guided core needle biopsy performed for 06/06/2011.  History:  Screen detected left breast mass had suspicious features and was biopsied under ultrasound guidance.  The patient reports some soreness after the biopsy.  She is otherwise doing well.  Exam:  Steri-Strips and band-Aid are in place over the incision in the far outer upper left breast.  No bruising or palpable hematoma is present.  Assessment and Plan:  Pathology results of the left breast mass demonstrate DCIS and invasive ductal carcinoma.  Pathology results are concordant with imaging findings.  An appointment with the multidisciplinary breast cancer clinic has been scheduled for the patient on 06/14/2010.  Breast MRI has been arranged for 06/13/2011.  The pathology results and appointments were discussed with the patient today.  Her questions were answered.  Original Report Authenticated By: Britta Mccreedy, M.D.     LABS:    Chemistry      Component Value Date/Time   NA 136 06/14/2011 0831   K 4.1 06/14/2011 0831   CL 104 06/14/2011 0831   CO2 22 06/14/2011 0831   BUN 11 06/14/2011 0831   CREATININE 0.67 06/14/2011 0831      Component Value Date/Time   CALCIUM 9.6 06/14/2011 0831   ALKPHOS 90 06/14/2011 0831   AST 13 06/14/2011 0831   ALT 14 06/14/2011 0831   BILITOT 0.2* 06/14/2011 0831      Lab Results  Component Value Date   WBC 7.8 06/14/2011   HGB 11.2* 06/14/2011   HCT 36.1 06/14/2011   MCV 75.3* 06/14/2011   PLT 207 06/14/2011       PATHOLOGY: ADDITIONAL INFORMATION: CHROMOGENIC IN-SITU HYBRIDIZATION Interpretation HER-2/NEU BY CISH - NO AMPLIFICATION OF HER-2 DETECTED. THE RATIO OF HER-2: CEP 17 SIGNALS WAS 1.08. Reference range: Ratio: HER2:CEP17 < 1.8 - gene amplification not observed Ratio: HER2:CEP 17 1.8-2.2 - equivocal result Ratio: HER2:CEP17 > 2.2 - gene amplification observed Pecola Leisure MD Pathologist, Electronic  Signature ( Signed 06/13/2011) PROGNOSTIC INDICATORS - ACIS Results IMMUNOHISTOCHEMICAL AND MORPHOMETRIC ANALYSIS BY THE AUTOMATED CELLULAR IMAGING SYSTEM (ACIS) This invasive carcinoma shows the following breast prognostic profile. Estrogen Receptor (Negative, <1%): 100%,POSITIVE, STRONG STAINING INTENSITY Progesterone Receptor (Negative, <1%): 100%, POSITIVE,STRONG STAINING INTENSITY Proliferation Marker Ki67 by M IB-1 (Low<20%): 6% All controls stained appropriately Abigail Miyamoto MD Pathologist, Electronic Signature ( Signed 06/08/2011) 1 of 2 FINAL for SARGUN, RUMMELL ([id number]) FINAL DIAGNOSIS Diagnosis Breast, left, needle core biopsy, 3 o'clock - INVASIVE DUCTAL CARCINOMA, SEE COMMENT. - DUCTAL CARCINOMA IN SITU. Microscopic Comment Although the grade of tumor is best assessed at resection, with these biopsies, both the invasive and in situ carcinoma are grade I. Microcalcifications are identified in the in situ and invasive carcinoma. The absence of myoepithelial layer is confirmed with lack of smooth muscle myosin heavy chain, calponin, and p63 immunostain expression. Breast prognostic studies are pending and will be reported in an addendum. (RM:eps 06/07/11) [place] RUND DO Pathologist, Electronic Signature (Case signed 06/08/2011) Specimen Gross  ASSESSMENT    56 year old female with  #1 screening detected left breast mass at the 3:00 position. Ultrasound revealed an 8 mm mass with an MRI showing a 1.2 cm mass. Final the core needle biopsy showed invasive ductal carcinoma grade 1 with DCIS ER positive PR positive HER-2/neu negative proliferation marker 6%. Patient was seen in the multidisciplinary breast clinic and her case was discussed at the multidisciplinary breast conference. She was seen by surgical oncology and radiation oncology.  #2 Patient with significant family history of prostate and breast cancer with early onset prostate cancer and early-onset breast  cancer in a brother and sister respectively.  #3 sarcoidosis.  PLAN:    #1 breast cancer: A recommendation of upfront lumpectomy with sentinel node procedure was made. Patient will get this scheduled in the next few weeks.  #2 genetic counseling and testing was also offered and patient will have this scheduled. This  is basically to evaluate her for BRCA1 and 2 gene mutations.  #3 post lumpectomy patient would be a candidate for adjuvant radiation therapy. She met with Dr. Doristine Devoid and had an extensive discussion.  #4 we discussed the role of adjuvant systemic treatments. Since patient has a very favorable disease my recommendation is for patient to have antiestrogen therapy consisting of an aromatase inhibitor such as Arimidex. We did discuss this with her.  #5 the patient also was introduced to the [facility] study and she has agreed to enroll in it.       Thank you so much for allowing me to participate in the care of Marilyn [name]. I will continue to follow up the patient with you and assist in her care.  All questions were answered. The patient knows to call the clinic with any problems, questions or concerns. We can certainly see the patient much sooner if necessary.  I spent 60 minutes counseling the patient face to face. The total time spent in the appointment was 60 minutes.  Drue Second, MD Medical/Oncology [facility] [phone] (beeper) [phone] (Office)  06/14/2011, 12:08 PM 06/14/2011, 12:08 PM

## 2011-06-14 NOTE — Progress Notes (Signed)
Mailed after appt letter to pt. 

## 2011-06-14 NOTE — Patient Instructions (Signed)
1. i will see you back in 5 weeks

## 2011-06-14 NOTE — Progress Notes (Signed)
Patient came in as a new patient today,she has one insurance.I did explain to her our financial assistance program and co-pay assistance ans she said sure,I gave her an epp application to fill out and  Return to [place].

## 2011-06-14 NOTE — Progress Notes (Addendum)
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 female who was found on screening mammogram to have an abnormality around 3:00 position of the left breast. She has not self palpated anything of concern. She underwent a biopsy, needle core biopsy on 06/06/2011. This demonstrated invasive ductal carcinoma at the 3:00 position of the left breast with ductal carcinoma in situ. This is ER 100% PR 100% and HER-2/neu negative. The Ki-67 was 6%. MRI on 06/13/2011 demonstrated a mass in the left breast that measured 1.2 x  0.9 x 1.2 cm in the lower outer quadrant. There is no other suspicious mass or enhancement seen in either breast by MRI. There is no axillary or internal mammary adenopathy by MRI.  She reports that she's gained about 20 pounds over the past 6 months. She's "just been more hungry than usual". She is otherwise in her usual state of health.   Of note, the patient reports that she had an excisional biopsy from the upper outer quadrant of the left breast when she was a teenager. This was apparently benign.   PAST MEDICAL HISTORY:  has a past medical history of History of bronchitis; History of sarcoidosis; Cigarette smoker; Venous insufficiency; Hyperlipidemia; GERD (gastroesophageal reflux disease); Diverticulosis of colon; IBS (irritable bowel syndrome); Colon polyp; History of UTI; DJD (degenerative joint disease); Vitamin d deficiency; HA (headache); Panic disorder; Depression; and History of anemia.    Gynecologic history: she reports that she was on hormone replacement therapy for about 1-2 years after she had a hysterectomy. She reports that  she started hormone replacement therapy approximately in year 2000. She has not carried any children to term. She started having her menstrual cycle at age 51 and stopped in 2000 after hysterectomy.  PAST SURGICAL HISTORY:  Past Surgical History  Procedure Date  . Total abdominal hysterectomy 2000    Dr Jennette Kettle  . Trigger finger release 2008    Dr Mina Marble  . Bunionectomy 2011    bilateral by Dr Celene Skeen  . Tonsillectomy 1972  . Cyst removed from right hand 1995     FAMILY HISTORY: family history includes Anemia in her mother; Breast cancer in her sister; Cancer in her brother, cousin, and father; Cancer (age of onset:30) in her sister; Colon cancer in her father; Hyperlipidemia in her brother; Hypertension in her brother; Multiple sclerosis in her sister; Other in her mother; Prostate cancer in her brother and father; and Sarcoidosis in her sister.   SOCIAL HISTORY:  reports that she quit smoking about 5 months ago. Her smoking use included Cigarettes. She smoked .5 packs per day. She does not have any smokeless tobacco history on file. She reports that she drinks alcohol.   ALLERGIES: Penicillins   MEDICATIONS:  Current Outpatient Prescriptions  Medication Sig Dispense Refill  . ALPRAZolam (XANAX) 0.5 MG tablet Take 0.5 mg by mouth 3 (three) times daily as needed.        . Calcium-Vitamin D 600-200 MG-UNIT per tablet Take 1 tablet by mouth 2 (two) times daily.        . cholecalciferol (VITAMIN D) 1000 UNITS tablet Take 1,000 Units  by mouth daily.        . Cinnamon 500 MG TABS Take by mouth.      . cyanocobalamin (,VITAMIN B-12,) 1000 MCG/ML injection Injection once weekly       . dextromethorphan-guaiFENesin (MUCINEX DM) 30-600 MG per 12 hr tablet Take 2 tablets by mouth every 12 (twelve) hours.        Marland Kitchen loratadine (CLARITIN REDITABS) 10 MG dissolvable tablet Take 10 mg by mouth daily.      . Multiple Vitamin (MULTIVITAMIN) tablet Take 1 tablet by mouth daily.        . NON FORMULARY  Inject 1 each as directed. HCG- Lipotropics Injection - last used in March      . phentermine 37.5 MG capsule Take 37.5 mg by mouth every morning.        . sertraline (ZOLOFT) 100 MG tablet Take 100 mg by mouth daily.          REVIEW OF SYSTEMS:  A comprehensive 14 point review of systems as obtained and reviewed with the patient. It is notable for that above.    PHYSICAL EXAM:   General: Alert and oriented, in no acute distress HEENT: Head is normocephalic. Pupils are equally round and reactive to light. Extraocular movements are intact. Oropharynx is clear. Neck: Neck is supple, no palpable cervical or supraclavicular lymphadenopathy. Heart: Regular in rate and rhythm with no murmurs, rubs, or gallops. Chest: Clear to auscultation bilaterally, with no rhonchi, wheezes, or rales. Abdomen: Soft, nontender, nondistended, with no rigidity or guarding. Extremities: No cyanosis or edema. Lymphatics: No concerning lymphadenopathy. Skin: No concerning lesions. Musculoskeletal: symmetric strength and muscle tone throughout. Neurologic: Cranial nerves II through XII are grossly intact. No obvious focalities. Speech is fluent. Coordination is intact. Psychiatric: Judgment and insight are intact. Affect is appropriate. Breasts: There is a palpable lesion in the lower outer quadrant of the left breast, about 2.5 cm in greatest dimension. Part of this is probably secondary to post biopsy change - I do not think this is necessarily all tumor. I am not able to appreciate any palpable abnormalities elsewhere in either breast. There is no palpable axillary or supraclavicular lymphadenopathy.   LABORATORY DATA:  Lab Results  Component Value Date   WBC 7.8 06/14/2011   HGB 11.2* 06/14/2011   HCT 36.1 06/14/2011   MCV 75.3* 06/14/2011   PLT 207 06/14/2011   CMP     Component Value Date/Time   NA 136 06/14/2011 0831   K 4.1 06/14/2011 0831   CL 104 06/14/2011 0831   CO2 22 06/14/2011 0831   GLUCOSE 82  06/14/2011 0831   BUN 11 06/14/2011 0831   CREATININE 0.67 06/14/2011 0831   CALCIUM 9.6 06/14/2011 0831   PROT 7.1 06/14/2011 0831   ALBUMIN 3.5 06/14/2011 0831   AST 13 06/14/2011 0831   ALT 14 06/14/2011 0831   ALKPHOS 90 06/14/2011 0831   BILITOT 0.2* 06/14/2011 0831   GFRNONAA 128.10 01/25/2010 1553   GFRAA 113 10/09/2007 1152   PATHOLOGY: As above   RADIOLOGY: As above   IMPRESSION/PLAN: This is a delightful 56 year old woman with T1 CN 0M0 left lower outer quadrant breast cancer. She and I spoke today about mastectomy versus breast conservation. She is enthusiastic about breast conservation. She is already met with Dr. Johna Sheriff and plans on undergoing a lumpectomy and sentinel lymph node biopsy. The likelihood is that she will need anti-estrogen therapy based on my discussion with Dr. Welton Flakes.    It was  a pleasure meeting the patient today. We discussed the risks, benefits, and side effects of radiotherapy. We discussed that radiation would take approximately 6 weeks to complete and that I would give the patient a few weeks to heal following surgery before starting treatment planning. We spoke about acute effects including skin irritation and fatigue as well as much less common late effects including lung and heart irritation. We spoke about the latest technology that is used to minimize the risk of late effects for breast cancer patients undergoing radiotherapy. No guarantees of treatment were given. The patient is enthusiastic about proceeding with treatment. I look forward to participating in the patient's care.  Genetics consultation has been offered to the patient in light of her family history.

## 2011-06-15 ENCOUNTER — Ambulatory Visit (HOSPITAL_BASED_OUTPATIENT_CLINIC_OR_DEPARTMENT_OTHER): Payer: PRIVATE HEALTH INSURANCE | Admitting: Genetic Counselor

## 2011-06-15 ENCOUNTER — Other Ambulatory Visit: Payer: PRIVATE HEALTH INSURANCE | Admitting: Lab

## 2011-06-15 ENCOUNTER — Telehealth: Payer: Self-pay | Admitting: Oncology

## 2011-06-15 DIAGNOSIS — C50519 Malignant neoplasm of lower-outer quadrant of unspecified female breast: Secondary | ICD-10-CM

## 2011-06-15 NOTE — Progress Notes (Addendum)
Dr.  Welton Flakes requested a consultation for genetic counseling and risk assessment for Marilyn [name], a 56 y.o. female, for discussion of her personal history of breast cancer. She presents to clinic today with her sisters to discuss the possibility of a genetic predisposition to cancer, and to further clarify her risks, as well as her family members' risks for cancer.   HISTORY OF PRESENT ILLNESS: In April 2013, at the age of 66, Marilyn [name] was diagnosed with invasive carcinoma of the left breast.    Past Medical History  Diagnosis Date  . History of bronchitis   . History of sarcoidosis   . Cigarette smoker   . Venous insufficiency   . Hyperlipidemia   . GERD (gastroesophageal reflux disease)   . Diverticulosis of colon   . IBS (irritable bowel syndrome)   . Colon polyp   . History of UTI   . DJD (degenerative joint disease)   . Vitamin d deficiency   . HA (headache)   . Panic disorder   . Depression   . History of anemia     Past Surgical History  Procedure Date  . Total abdominal hysterectomy 2000    Dr Jennette Kettle  . Trigger finger release 2008    Dr Mina Marble  . Bunionectomy 2011    bilateral by Dr Celene Skeen  . Tonsillectomy 1972  . Cyst removed from right hand 1995    History  Substance Use Topics  . Smoking status: Former Smoker -- 0.5 packs/day    Types: Cigarettes    Quit date: 12/25/2010  . Smokeless tobacco: Not on file  . Alcohol Use: 0.0 oz/week    0-1 drink(s) per week     social    REPRODUCTIVE HISTORY AND PERSONAL RISK ASSESSMENT FACTORS: Menarche was at age 83.   Menopause was at age 56 Uterus Intact: No Ovaries Intact: One ovary [id number] , first live birth at age N/A  She has not previously undergone treatment for infertility.   OCP use: 10 years   She has not used HRT in the past.    FAMILY HISTORY:  We obtained a detailed, 4-generation family history.  Significant diagnoses are listed below: Family History  Problem Relation Age of Onset   . Anemia Mother   . Other Mother     + H Pylori  . Prostate cancer Father   . Colon cancer Father     diagnosed 2009  . Cancer Father     colon and prostate cancer  . Breast cancer Sister     #1  . Cancer Sister 30    breast cancer  . Sarcoidosis Sister     #2  . Multiple sclerosis Sister     #2  . Hypertension Brother     #1  . Cancer Brother     prostate cancer  . Prostate cancer Brother     #1  . Hyperlipidemia Brother     #1  . Cancer Cousin     breast cancer  The patient was diagnosed with breast cancer at age 89.  She has one maternal half sister with breast cancer at age 74.  She has one full sister and one full brother.  Her brother had prostate cancer at age 22.  The patients father was diagnosed with prostate cancer and colon cancer at age 41.  He has one full sister and a paternal half sister and brother.  The full sister has a daughter with breast cancer in her  12s.  The patient's father's maternal grandmother had oral cancer.  Ms. Marilyn [name] mother is alive and well.  She had three sisters and three brothers.  One brother died of lung cancer.  There is reportedly "a lot of cancer" in the patient's mothers extended cousins.  Patient's maternal ancestors are of [place] and [place] descent, and paternal ancestors are of [place] American descent. There is no reported Ashkenazi Jewish ancestry. There is no known consanguinity.  GENETIC COUNSELING RISK ASSESSMENT, DISCUSSION, AND SUGGESTED FOLLOW UP: We reviewed the natural history and genetic etiology of sporadic, familial and hereditary cancer syndromes.  About 5-10% of breast cancer is hereditary and of that, about 85% is the result of a BRCA1 or BRCA2 mutation.  We discussed the red flags of hereditary cancer syndromes and the dominant inheritance pattern.    The patient's personal and family history is suggestive of the following possible diagnosis: hereditary breast cancer  We discussed that identification of  a hereditary cancer syndrome may help her care providers tailor the patients medical management. If a mutation indicating hereditary breast cancer is detected in this case, the [employer] recommendations would include increased cancer surveillance and possible prophylactic surgery. If a mutation is detected, the patient will be referred back to the referring provider and to any additional appropriate care providers to discuss the relevant options.   If a mutation is not found in the patient, this will decrease the likelihood of hereditary breast cancer as the explanation for her breast cancer. Cancer surveillance options would be discussed for the patient according to the appropriate standard National Comprehensive Cancer Network and American Cancer Society guidelines, with consideration of their personal and family history risk factors. In this case, the patient will be referred back to their care providers for discussions of management.   In order to estimate her chance of having a BRCA1 and BRCA2 mutation, we used statistical models (Penn II) and laboratory data that take into account her personal medical history, family history and ancestry.  Because each model is different, there can be a lot of variability in the risks they give.  Therefore, these numbers must be considered a rough range and not a precise risk of having a BRCA1 or BRCA2 mutation.  These models estimate that she has approximately a 9-15% chance of having a mutation. Taking each side of the family independently, there is a 2% chance for testing positive for BRCA1 and a 7% chance for testing positive for a BRCA2 mutation based on the paternal family history.  There is a 6% chance for testing positive for a BRCA1 mutation and a 9% chance of testing positive for a BRCA2 mutation based on her maternal family history.  Based on this assessment of her family and personal history, genetic testing is  recommended.   After considering the risks, benefits, and limitations, the patient provided informed consent for  the following  testing: [facility] through [employer].   Per the patient's request, we will contact her by telephone to discuss these results. A follow up genetic counseling visit will be scheduled if indicated.  The patient was seen for a total of 60 minutes, greater than 50% of which was spent face-to-face counseling.  This plan is being carried out per Dr. Milta Deiters recommendations.  This note will also be sent to the referring provider via the electronic medical record. The patient will be supplied with a summary of this genetic counseling discussion as well as educational information on  the discussed hereditary cancer syndromes following the conclusion of their visit.   Patient was discussed with Dr. Drue Second.   _______________________________________________________________________ For Office Staff:  Number of people involved in session: 4 Was an Intern/ student involved with case: not applicable

## 2011-06-15 NOTE — Telephone Encounter (Signed)
lmonvm adviising the pt of her may 2013 appts

## 2011-06-19 ENCOUNTER — Telehealth: Payer: Self-pay | Admitting: *Deleted

## 2011-06-19 NOTE — Telephone Encounter (Signed)
Follow-up call.  BMDC-06/14/11.  Left msg on voice mail to return call.

## 2011-06-20 ENCOUNTER — Encounter (HOSPITAL_COMMUNITY): Payer: Self-pay | Admitting: Pharmacy Technician

## 2011-06-20 ENCOUNTER — Ambulatory Visit (HOSPITAL_BASED_OUTPATIENT_CLINIC_OR_DEPARTMENT_OTHER): Admit: 2011-06-20 | Payer: Self-pay | Admitting: General Surgery

## 2011-06-20 ENCOUNTER — Encounter (HOSPITAL_BASED_OUTPATIENT_CLINIC_OR_DEPARTMENT_OTHER): Payer: Self-pay

## 2011-06-20 ENCOUNTER — Telehealth: Payer: Self-pay | Admitting: *Deleted

## 2011-06-20 ENCOUNTER — Other Ambulatory Visit (HOSPITAL_COMMUNITY): Payer: PRIVATE HEALTH INSURANCE

## 2011-06-20 SURGERY — BREAST LUMPECTOMY WITH NEEDLE LOCALIZATION AND AXILLARY SENTINEL LYMPH NODE BX
Anesthesia: General | Laterality: Left

## 2011-06-20 NOTE — Telephone Encounter (Signed)
Spoke with patient to discuss Care Plan Summary/Treatment plan.  She denies questions/concerns at this time.  Upcoming appointments (pre-adm, surgery, radiation, and office visit) discussed as well as the role of the [employer].  She was strongly encouraged to call with questions.  Contact information provided.  Pt. Verbalized understanding and agreed.

## 2011-06-23 ENCOUNTER — Telehealth: Payer: Self-pay | Admitting: *Deleted

## 2011-06-23 NOTE — Telephone Encounter (Signed)
Pt called requesting a nutrition consult.  Informed pt that we will be offering a nutrition class for newly dx breast cancer patients in June.  Pt relate she would like to sign up for the class.  I gave Vernell Leep her information to enroll her in the class.  Informed pt the class will be on June 6th from 1/2pm and that we will call with the room assignment.  Received verbal understanding.  Pt denies further needs or concerns at this time.  Encourage pt to call with further questions.  Contact information given.

## 2011-06-26 ENCOUNTER — Encounter (HOSPITAL_COMMUNITY): Payer: Self-pay

## 2011-06-26 ENCOUNTER — Encounter (HOSPITAL_COMMUNITY)
Admission: RE | Admit: 2011-06-26 | Discharge: 2011-06-26 | Disposition: A | Discharge status: Home / Self Care | Payer: PRIVATE HEALTH INSURANCE | Source: Ambulatory Visit | Attending: General Surgery | Admitting: General Surgery

## 2011-06-26 ENCOUNTER — Telehealth: Payer: Self-pay | Admitting: *Deleted

## 2011-06-26 HISTORY — DX: Unspecified open wound of unspecified breast, initial encounter: S21.009A

## 2011-06-26 HISTORY — DX: Malignant (primary) neoplasm, unspecified: C80.1

## 2011-06-26 HISTORY — DX: Other complications of anesthesia, initial encounter: T88.59XA

## 2011-06-26 HISTORY — DX: Adverse effect of unspecified anesthetic, initial encounter: T41.45XA

## 2011-06-26 HISTORY — DX: Headache: R51

## 2011-06-26 LAB — SURGICAL PCR SCREEN: Staphylococcus aureus: NEGATIVE

## 2011-06-26 LAB — CBC
Platelets: 212 10*3/uL (ref 150–400)
RBC: 5.31 MIL/uL — ABNORMAL HIGH (ref 3.87–5.11)
RDW: 14.7 % (ref 11.5–15.5)
WBC: 8.2 10*3/uL (ref 4.0–10.5)

## 2011-06-26 LAB — BASIC METABOLIC PANEL
Calcium: 10.2 mg/dL (ref 8.4–10.5)
Creatinine, Ser: 0.69 mg/dL (ref 0.50–1.10)
GFR calc Af Amer: >90 mL/min (ref >90)
Sodium: 137 mEq/L (ref 135–145)

## 2011-06-26 NOTE — Pre-Procedure Instructions (Addendum)
20 Marilyn [name]  06/26/2011   Your procedure is scheduled on:  Jul 05, 2011 (Wed)  Report to [facility] at 9:30 AM.  Call this number if you have problems the morning of surgery: [phone]   Remember:   Do not eat food:After Midnight.  May have clear liquids: up to 4 Hours before arrival.  Clear liquids include soda, tea, black coffee, apple or grape juice, broth.  Take these medicines the morning of surgery with A SIP OF WATER: claritin, xanax   Do not wear jewelry, make-up or nail polish.  Do not wear lotions, powders, or perfumes. You may wear deodorant.  Do not shave 48 hours prior to surgery.  Do not bring valuables to the hospital.  Contacts, dentures or bridgework may not be worn into surgery.  Leave suitcase in the car. After surgery it may be brought to your room.  For patients admitted to the hospital, checkout time is 11:00 AM the day of discharge.   Patients discharged the day of surgery will not be allowed to drive home.  Name and phone number of your driver: Marilyn [name]  Special Instructions: CHG Shower Use Special Wash: 1/2 bottle night before surgery and 1/2 bottle morning of surgery.   Please read over the following fact sheets that you were given: Pain Booklet and Surgical Site Infection Prevention

## 2011-06-26 NOTE — Telephone Encounter (Signed)
Spoke with patient today to assess for concerns.  Pt. stated she was not doing well today.  Reports sister passed away unexpectely, however, she did report keeping her appointment today.  She was advised to call with question/concerns. Patient agreeable.

## 2011-07-03 ENCOUNTER — Telehealth: Payer: Self-pay | Admitting: Genetic Counselor

## 2011-07-03 NOTE — Telephone Encounter (Signed)
Revealed negative BRCA and BART results. 

## 2011-07-04 ENCOUNTER — Telehealth: Payer: Self-pay | Admitting: *Deleted

## 2011-07-04 MED ORDER — CIPROFLOXACIN IN D5W 400 MG/200ML IV SOLN
400.0000 mg | INTRAVENOUS | Status: AC
  Administered 2011-07-05: 400 mg via INTRAVENOUS
  Filled 2011-07-04: qty 200

## 2011-07-04 NOTE — Telephone Encounter (Signed)
Spoke with patient to offer support for surgery 07/05/11 and to assess needs/concerns.  Discussed surgery preparation measures and supportive garments.  Pt. with questions regarding radiation appointment.  RN reviewed appointment schedule and pointed out that she will see radiation oncologist after she sees medical oncologist.  Appointments re-confirmed.  Pt. also reports designated driver and support person available.  All questions answered.

## 2011-07-05 ENCOUNTER — Ambulatory Visit (HOSPITAL_COMMUNITY): Payer: PRIVATE HEALTH INSURANCE | Admitting: Anesthesiology

## 2011-07-05 ENCOUNTER — Encounter (HOSPITAL_COMMUNITY): Payer: Self-pay | Admitting: Anesthesiology

## 2011-07-05 ENCOUNTER — Ambulatory Visit
Admission: RE | Admit: 2011-07-05 | Discharge: 2011-07-05 | Disposition: A | Discharge status: Home / Self Care | Payer: PRIVATE HEALTH INSURANCE | Source: Ambulatory Visit | Attending: General Surgery | Admitting: General Surgery

## 2011-07-05 ENCOUNTER — Ambulatory Visit (HOSPITAL_COMMUNITY)
Admission: RE | Admit: 2011-07-05 | Discharge: 2011-07-05 | Disposition: A | Discharge status: Home / Self Care | Payer: PRIVATE HEALTH INSURANCE | Source: Ambulatory Visit | Attending: General Surgery | Admitting: General Surgery

## 2011-07-05 ENCOUNTER — Encounter (HOSPITAL_COMMUNITY)
Admission: RE | Disposition: A | Discharge status: Home / Self Care | Payer: Self-pay | Source: Ambulatory Visit | Attending: General Surgery

## 2011-07-05 ENCOUNTER — Encounter (HOSPITAL_COMMUNITY): Payer: Self-pay | Admitting: *Deleted

## 2011-07-05 ENCOUNTER — Other Ambulatory Visit (INDEPENDENT_AMBULATORY_CARE_PROVIDER_SITE_OTHER): Payer: Self-pay | Admitting: General Surgery

## 2011-07-05 DIAGNOSIS — C50419 Malignant neoplasm of upper-outer quadrant of unspecified female breast: Secondary | ICD-10-CM

## 2011-07-05 DIAGNOSIS — D869 Sarcoidosis, unspecified: Secondary | ICD-10-CM | POA: Insufficient documentation

## 2011-07-05 DIAGNOSIS — Z01812 Encounter for preprocedural laboratory examination: Secondary | ICD-10-CM | POA: Insufficient documentation

## 2011-07-05 DIAGNOSIS — F17200 Nicotine dependence, unspecified, uncomplicated: Secondary | ICD-10-CM | POA: Insufficient documentation

## 2011-07-05 DIAGNOSIS — K219 Gastro-esophageal reflux disease without esophagitis: Secondary | ICD-10-CM | POA: Insufficient documentation

## 2011-07-05 DIAGNOSIS — D0590 Unspecified type of carcinoma in situ of unspecified breast: Secondary | ICD-10-CM

## 2011-07-05 DIAGNOSIS — C50919 Malignant neoplasm of unspecified site of unspecified female breast: Secondary | ICD-10-CM | POA: Insufficient documentation

## 2011-07-05 HISTORY — PX: BREAST SURGERY: SHX581

## 2011-07-05 SURGERY — BREAST LUMPECTOMY WITH NEEDLE LOCALIZATION AND AXILLARY SENTINEL LYMPH NODE BX
Anesthesia: General | Site: Breast | Laterality: Left

## 2011-07-05 MED ORDER — ONDANSETRON HCL 4 MG/2ML IJ SOLN
INTRAMUSCULAR | Status: DC | PRN
  Administered 2011-07-05: 4 mg via INTRAVENOUS

## 2011-07-05 MED ORDER — MIDAZOLAM HCL 2 MG/2ML IJ SOLN
INTRAMUSCULAR | Status: AC
  Filled 2011-07-05: qty 2

## 2011-07-05 MED ORDER — LACTATED RINGERS IV SOLN
INTRAVENOUS | Status: DC
  Administered 2011-07-05: 11:00:00 via INTRAVENOUS

## 2011-07-05 MED ORDER — SODIUM CHLORIDE 0.9 % IJ SOLN
INTRAMUSCULAR | Status: DC | PRN
  Administered 2011-07-05: 3 mL

## 2011-07-05 MED ORDER — BUPIVACAINE-EPINEPHRINE 0.25% -1:200000 IJ SOLN
INTRAMUSCULAR | Status: DC | PRN
  Administered 2011-07-05: 30 mL

## 2011-07-05 MED ORDER — LIDOCAINE HCL (CARDIAC) 20 MG/ML IV SOLN
INTRAVENOUS | Status: DC | PRN
  Administered 2011-07-05: 50 mg via INTRAVENOUS

## 2011-07-05 MED ORDER — PROPOFOL 10 MG/ML IV EMUL
INTRAVENOUS | Status: DC | PRN
  Administered 2011-07-05: 50 mg via INTRAVENOUS
  Administered 2011-07-05: 200 mg via INTRAVENOUS

## 2011-07-05 MED ORDER — HYDROCODONE-ACETAMINOPHEN 5-325 MG PO TABS: 1.0000 | ORAL_TABLET | ORAL | Status: AC | PRN

## 2011-07-05 MED ORDER — METHYLENE BLUE 1 % INJ SOLN
INTRAMUSCULAR | Status: DC | PRN
  Administered 2011-07-05: 2 mL via SUBMUCOSAL

## 2011-07-05 MED ORDER — FENTANYL CITRATE 0.05 MG/ML IJ SOLN
INTRAMUSCULAR | Status: AC
  Filled 2011-07-05: qty 2

## 2011-07-05 MED ORDER — FENTANYL CITRATE 0.05 MG/ML IJ SOLN
INTRAMUSCULAR | Status: DC | PRN
  Administered 2011-07-05 (×5): 25 ug via INTRAVENOUS
  Administered 2011-07-05 (×3): 50 ug via INTRAVENOUS

## 2011-07-05 MED ORDER — LACTATED RINGERS IV SOLN
INTRAVENOUS | Status: DC | PRN
  Administered 2011-07-05 (×2): via INTRAVENOUS

## 2011-07-05 MED ORDER — MIDAZOLAM HCL 2 MG/2ML IJ SOLN
0.5000 mg | Freq: Once | INTRAMUSCULAR | Status: AC
  Administered 2011-07-05: 0.5 mg via INTRAVENOUS

## 2011-07-05 MED ORDER — 0.9 % SODIUM CHLORIDE (POUR BTL) OPTIME
TOPICAL | Status: DC | PRN
  Administered 2011-07-05: 1000 mL

## 2011-07-05 MED ORDER — MIDAZOLAM HCL 5 MG/5ML IJ SOLN
INTRAMUSCULAR | Status: DC | PRN
  Administered 2011-07-05: 2 mg via INTRAVENOUS

## 2011-07-05 SURGICAL SUPPLY — 52 items
APPLIER CLIP 9.375 MED OPEN (MISCELLANEOUS) ×2
BINDER BREAST LRG (GAUZE/BANDAGES/DRESSINGS) IMPLANT
BINDER BREAST XLRG (GAUZE/BANDAGES/DRESSINGS) ×2 IMPLANT
BLADE SURG 10 STRL SS (BLADE) ×2 IMPLANT
BLADE SURG 15 STRL LF DISP TIS (BLADE) ×1 IMPLANT
BLADE SURG 15 STRL SS (BLADE) ×1
CANISTER SUCTION 2500CC (MISCELLANEOUS) ×2 IMPLANT
CHLORAPREP W/TINT 26ML (MISCELLANEOUS) ×2 IMPLANT
CLIP APPLIE 9.375 MED OPEN (MISCELLANEOUS) ×1 IMPLANT
CLOTH BEACON ORANGE TIMEOUT ST (SAFETY) ×2 IMPLANT
CONT SPEC STER OR (MISCELLANEOUS) ×2 IMPLANT
COVER PROBE W GEL 5X96 (DRAPES) ×2 IMPLANT
COVER SURGICAL LIGHT HANDLE (MISCELLANEOUS) ×2 IMPLANT
DECANTER SPIKE VIAL GLASS SM (MISCELLANEOUS) ×2 IMPLANT
DERMABOND ADHESIVE PROPEN (GAUZE/BANDAGES/DRESSINGS) ×1
DERMABOND ADVANCED (GAUZE/BANDAGES/DRESSINGS)
DERMABOND ADVANCED .7 DNX12 (GAUZE/BANDAGES/DRESSINGS) IMPLANT
DERMABOND ADVANCED .7 DNX6 (GAUZE/BANDAGES/DRESSINGS) ×1 IMPLANT
DEVICE DUBIN SPECIMEN MAMMOGRA (MISCELLANEOUS) ×2 IMPLANT
DRAPE CHEST BREAST 15X10 FENES (DRAPES) ×2 IMPLANT
ELECT CAUTERY BLADE 6.4 (BLADE) ×2 IMPLANT
ELECT REM PT RETURN 9FT ADLT (ELECTROSURGICAL) ×2
ELECTRODE REM PT RTRN 9FT ADLT (ELECTROSURGICAL) ×1 IMPLANT
GLOVE BIOGEL PI IND STRL 8 (GLOVE) ×1 IMPLANT
GLOVE BIOGEL PI INDICATOR 8 (GLOVE) ×1
GLOVE SS BIOGEL STRL SZ 7.5 (GLOVE) ×1 IMPLANT
GLOVE SUPERSENSE BIOGEL SZ 7.5 (GLOVE) ×1
GOWN PREVENTION PLUS XLARGE (GOWN DISPOSABLE) ×2 IMPLANT
GOWN STRL NON-REIN LRG LVL3 (GOWN DISPOSABLE) ×2 IMPLANT
KIT BASIN OR (CUSTOM PROCEDURE TRAY) ×2 IMPLANT
KIT MARKER MARGIN INK (KITS) IMPLANT
KIT ROOM TURNOVER OR (KITS) ×2 IMPLANT
NEEDLE 18GX1X1/2 (RX/OR ONLY) (NEEDLE) ×2 IMPLANT
NEEDLE HYPO 25GX1X1/2 BEV (NEEDLE) ×4 IMPLANT
NS IRRIG 1000ML POUR BTL (IV SOLUTION) ×2 IMPLANT
PACK SURGICAL SETUP 50X90 (CUSTOM PROCEDURE TRAY) ×2 IMPLANT
PAD ARMBOARD 7.5X6 YLW CONV (MISCELLANEOUS) ×2 IMPLANT
PENCIL BUTTON HOLSTER BLD 10FT (ELECTRODE) ×2 IMPLANT
SPONGE LAP 18X18 X RAY DECT (DISPOSABLE) ×2 IMPLANT
STAPLER VISISTAT 35W (STAPLE) ×2 IMPLANT
STOCKINETTE IMPERVIOUS 9X36 MD (GAUZE/BANDAGES/DRESSINGS) IMPLANT
SUT MNCRL AB 4-0 PS2 18 (SUTURE) ×4 IMPLANT
SUT SILK 2 0 SH (SUTURE) IMPLANT
SUT VIC AB 2-0 SH 27 (SUTURE) ×2
SUT VIC AB 2-0 SH 27XBRD (SUTURE) ×2 IMPLANT
SUT VIC AB 3-0 SH 27 (SUTURE) ×2
SUT VIC AB 3-0 SH 27XBRD (SUTURE) ×2 IMPLANT
SYR CONTROL 10ML LL (SYRINGE) ×4 IMPLANT
TOWEL OR 17X24 6PK STRL BLUE (TOWEL DISPOSABLE) ×2 IMPLANT
TOWEL OR 17X26 10 PK STRL BLUE (TOWEL DISPOSABLE) ×2 IMPLANT
TUBE CONNECTING 12X1/4 (SUCTIONS) ×2 IMPLANT
YANKAUER SUCT BULB TIP NO VENT (SUCTIONS) ×2 IMPLANT

## 2011-07-05 NOTE — Progress Notes (Signed)
Dr Randa Evens at bedside. Order received to give versed 0.5 mg IV in pacu.

## 2011-07-05 NOTE — Transfer of Care (Signed)
Immediate Anesthesia Transfer of Care Note  Patient: Marilyn [name]  Procedure(s) Performed: Procedure(s) (LRB): BREAST LUMPECTOMY WITH NEEDLE LOCALIZATION AND AXILLARY SENTINEL LYMPH NODE BX (Left)  Patient Location: PACU  Anesthesia Type: General  Level of Consciousness: awake, alert  and oriented  Airway & Oxygen Therapy: Patient Spontanous Breathing and Patient connected to nasal cannula oxygen  Post-op Assessment: Report given to PACU RN, Post -op Vital signs reviewed and stable and Patient moving all extremities X 4  Post vital signs: Reviewed and stable  Complications: No apparent anesthesia complications

## 2011-07-05 NOTE — Interval H&P Note (Signed)
History and Physical Interval Note:  07/05/2011 11:10 AM  Marilyn [name]  has presented today for surgery, with the diagnosis of cancer of the left breast  The various methods of treatment have been discussed with the patient and family. After consideration of risks, benefits and other options for treatment, the patient has consented to  Procedure(s) (LRB): BREAST LUMPECTOMY WITH NEEDLE LOCALIZATION AND AXILLARY SENTINEL LYMPH NODE BX (Left) as a surgical intervention .  The patients' history has been reviewed, patient examined, no change in status, stable for surgery.  I have reviewed the patients' chart and labs.  Questions were answered to the patient's satisfaction.     Ellesse Antenucci T

## 2011-07-05 NOTE — Anesthesia Postprocedure Evaluation (Signed)
  Anesthesia Post-op Note  Patient: Marilyn [name]  Procedure(s) Performed: Procedure(s) (LRB): BREAST LUMPECTOMY WITH NEEDLE LOCALIZATION AND AXILLARY SENTINEL LYMPH NODE BX (Left)  Patient Location: PACU  Anesthesia Type: General  Level of Consciousness: awake  Airway and Oxygen Therapy: Patient Spontanous Breathing  Post-op Pain: mild  Post-op Assessment: Post-op Vital signs reviewed  Post-op Vital Signs: Reviewed  Complications: No apparent anesthesia complications

## 2011-07-05 NOTE — Anesthesia Preprocedure Evaluation (Addendum)
Anesthesia Evaluation  Patient identified by MRN, date of birth, ID band Patient awake    Reviewed: Allergy & Precautions, H&P , NPO status , Patient's Chart, lab work & pertinent test results  Airway Mallampati: II TM Distance: >3 FB Neck ROM: Full    Dental  (+) Teeth Intact and Dental Advisory Given   Pulmonary neg pulmonary ROS,  breath sounds clear to auscultation        Cardiovascular Rhythm:Regular Rate:Normal     Neuro/Psych  Headaches,    GI/Hepatic Neg liver ROS, GERD-  ,  Endo/Other  negative endocrine ROS  Renal/GU negative Renal ROS     Musculoskeletal negative musculoskeletal ROS (+)   Abdominal   Peds  Hematology negative hematology ROS (+)   Anesthesia Other Findings   Reproductive/Obstetrics                          Anesthesia Physical Anesthesia Plan  ASA: III  Anesthesia Plan: General   Post-op Pain Management:    Induction: Intravenous  Airway Management Planned: Oral ETT  Additional Equipment:   Intra-op Plan:   Post-operative Plan: Extubation in OR  Informed Consent: I have reviewed the patients History and Physical, chart, labs and discussed the procedure including the risks, benefits and alternatives for the proposed anesthesia with the patient or authorized representative who has indicated his/her understanding and acceptance.     Plan Discussed with: CRNA  Anesthesia Plan Comments:         Anesthesia Quick Evaluation

## 2011-07-05 NOTE — H&P (View-Only) (Signed)
Subjective:   new diagnosis cancer of the left breast  Patient ID: Marilyn [name], female   DOB: 08/12/1955, 56 y.o.   MRN: [id number]  HPI Patient is a very pleasant 56-year-old female seen in the breast multidisciplinary clinic, referred by Dr. Brozetti at the breast imaging Center for a new diagnosis of left breast cancer. The patient has a family history of her sister developing breast cancer in her 30s and therefore has been very compliant with yearly screening mammograms. She recently presented for her routine screening mammogram. She had not noted any lump in her breast or nipple discharge or skin change or other unusual symptom. Noted in the left breast at the 3:00 position was an approximately 1 cm suspicious spiculated mass. This was confirmed by ultrasound measuring 7 mm. Ultrasound guided core biopsy was recommended and performed. 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 Subsequent bilateral breast MR was performed which confirmed a mass in the lower outer quadrant of the left breast posteriorly measuring 1.2 cm in greatest dimension. There were no other masses in either breast and no apparent adenopathy. The patient is here for further treatment planning.  Past Medical History  Diagnosis Date  . History of bronchitis   . History of sarcoidosis   . Cigarette smoker   . Venous insufficiency   . Hyperlipidemia   . GERD (gastroesophageal reflux disease)   . Diverticulosis of colon   . IBS (irritable bowel syndrome)   . Colon polyp   . History of UTI   . DJD (degenerative joint disease)   . Vitamin d deficiency   . HA (headache)   . Panic disorder   . Depression   . History of anemia    Past Surgical History  Procedure Date  . Total abdominal hysterectomy 2000    Dr [name]  . Trigger finger release 2008    Dr Weingold  . Bunionectomy 2011    bilateral by Dr Tuckman  . Tonsillectomy 1972  . Cyst removed from  right hand 1995   .cmd Allergies  Allergen Reactions  . Penicillins Rash    REACTION: rash   History  Substance Use Topics  . Smoking status: Former Smoker -- 0.5 packs/day    Types: Cigarettes    Quit date: 12/25/2010  . Smokeless tobacco: Not on file  . Alcohol Use: 0.0 oz/week    0-1 drink(s) per week     social   family history includes Anemia in her mother; Breast cancer in her sister; Cancer in her brother, cousin, and father; Cancer (age of onset:30) in her sister; Colon cancer in her father; Hyperlipidemia in her brother; Hypertension in her brother; Multiple sclerosis in her sister; Other in her mother; Prostate cancer in her brother and father; and Sarcoidosis in her sister.   Review of Systems  Constitutional: Negative.   HENT: Positive for congestion, rhinorrhea and postnasal drip.   Eyes: Negative.   Respiratory: Negative.   Cardiovascular: Negative.   Gastrointestinal: Negative.        Colonoscopy is up-to-date  Musculoskeletal: Positive for arthralgias.  Psychiatric/Behavioral: Negative.        Objective:   Physical Exam General: Alert, well-developed African American female, in no distress Skin: Warm and dry without rash or infection. HEENT: No palpable masses or thyromegaly. Sclera nonicteric. Pupils equal round and reactive. Oropharynx clear. Lymph nodes: No cervical, supraclavicular, or inguinal nodes palpable. Breasts: There is some slight bruising in   thickening at the biopsy site in the lower outer left breast. No definite masses. Healed old incisions upper outer quadrants bilaterally. No skin changes or nipple inversion. Lungs: Breath sounds clear and equal without increased work of breathing Cardiovascular: Regular rate and rhythm without murmur. No JVD or edema. Peripheral pulses intact. Abdomen: Nondistended. Soft and nontender. No masses palpable. No organomegaly. No palpable hernias. Extremities: No edema or joint swelling or deformity. No  chronic venous stasis changes. Neurologic: Alert and fully oriented. Gait normal.     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 We discussed the surgical treatment options in detail. I believe she would be a good candidate for breast conservation which is what she would prefer. We discussed needle localized lumpectomy and axillary sentinel lymph node biopsy. The nature of the procedure, recovery, risks of bleeding, infection, anesthetic risk, and possible need for further surgery based on final pathology were discussed and understood. We discussed the likely need for postoperative radiation and possible adjuvant treatments. All her questions were answered. She will be undergoing genetic screening due to her family history and we will plan her surgery out beyond 2 weeks so that we have these results prior to proceeding.    Plan:     Needle localized left breast lumpectomy and left axillary sentinel lymph node biopsy under general anesthesia as an outpatient pending her genetic screening.      

## 2011-07-05 NOTE — Op Note (Signed)
Preoperative Diagnosis: cancer of the left breast  Postoprative Diagnosis: cancer of the left breast  Procedure: Procedure(s): BREAST LUMPECTOMY WITH NEEDLE LOCALIZATION AND AXILLARY SENTINEL LYMPH NODE BX, Blue dye injection   Surgeon: Glenna Fellows T   Assistants: None  Anesthesia:  General LMA anesthesiaDiagnos  Indications:   Patient is a 56 year old female with the recent diagnosis of cancer of the left breast, clinical T1, N0, M0. After extensive discussion of treatment options and risks detailed elsewhere she is brought to the operating room for needle localized lumpectomy and sentinel lymph node biopsy.  Procedure Detail:  Preoperatively the patient underwent successful needle localization of the approximately 1 cm mass in the posterior left breast at the 3 to 4:00 position. 1 mCi of technetium sulfur colloid was injected around the left nipple skin preoperatively. The patient was then brought to the operating room placed in the supine position on the operating table and laryngeal mask general anesthesia induced. Under sterile technique after patient timeout and procedure confirmation 5 cc of dilute methylene blue was injected subcutaneously under the left nipple and massaged. The entire left breast chest axilla and upper arm were then widely sterilely prepped and draped. She had received preoperative IV antibiotics. The lumpectomy was approached initially. A curvilinear incision was made near the wire insertion site in the inferior lateral left breast and dissection was carried down through the subcutaneous tissue toward the breast capsule. The wire was brought into the incision. As the dissection deepened by excised with cautery a generous specimen of tissue around the shaft and tip of the wire the specimen was removed. Specimen mammogram was obtained which appeared to show the mass and clip in the center of the specimen. The specimen and margins were inked for orientation and sent  for permanent pathology. There was a little bit of hemorrhagic tissue at the medial margin of the lumpectomy site and I did take a further medial margin which was oriented and sent for permanent section. Hemostasis was obtained with 3-0 Vicryl sutures and cautery. Attention was then turned to the sentinel lymph node. The neoprobe was used to localize a definite hot area in the left axilla. A small transverse incision was made and dissection carried down through the subcutaneous tissue and the axilla proper entered. Using the neoprobe for guidance I dissected down on to a otherwise normal-appearing blue lymph node with high counts and this was completely excised with cautery. Ex vivo the node had counts of over 2000 with background in the axilla of less than 100. This was sent for permanent section as hot blue left axillary sentinel lymph node. Each incision was infiltrated with Marcaine with epinephrine and irrigated and complete hemostasis assured. The subcutaneous in the axilla and the breast tissue a simultaneous breast tissue was closed with interrupted 3-0 Vicryl. Skin incisions were closed with subcuticular Monocryl and Dermabond. Sponge needle and instrument counts were correct.    Estimated Blood Loss:  Minimal         Drains: none  Blood Given: none          Specimens: #1 left breast lumpectomy #2 further medial margin #3 left axillary sentinel lymph node        Complications:  * No complications entered in OR log *         Disposition: PACU - hemodynamically stable.         Condition: stable  Mariella Saa MD, FACS  07/05/2011, 1:18 PM

## 2011-07-05 NOTE — Discharge Instructions (Signed)
[facility] °Office Phone Number [phone] ° °BREAST BIOPSY/ PARTIAL MASTECTOMY: POST OP INSTRUCTIONS ° °Always review your discharge instruction sheet given to you by the facility where your surgery was performed. ° °IF YOU HAVE DISABILITY OR FAMILY LEAVE FORMS, YOU MUST BRING THEM TO THE OFFICE FOR PROCESSING.  DO NOT GIVE THEM TO YOUR DOCTOR. ° °1. A prescription for pain medication may be given to you upon discharge.  Take your pain medication as prescribed, if needed.  If narcotic pain medicine is not needed, then you may take acetaminophen (Tylenol) or ibuprofen (Advil) as needed. °2. Take your usually prescribed medications unless otherwise directed °3. If you need a refill on your pain medication, please contact your pharmacy.  They will contact our office to request authorization.  Prescriptions will not be filled after 5pm or on week-ends. °4. You should eat very light the first 24 hours after surgery, such as soup, crackers, pudding, etc.  Resume your normal diet the day after surgery. °5. Most patients will experience some swelling and bruising in the breast.  Ice packs and a good support bra will help.  Swelling and bruising can take several days to resolve.  °6. It is common to experience some constipation if taking pain medication after surgery.  Increasing fluid intake and taking a stool softener will usually help or prevent this problem from occurring.  A mild laxative (Milk of Magnesia or Miralax) should be taken according to package directions if there are no bowel movements after 48 hours. °7. Unless discharge instructions indicate otherwise, you may remove your bandages 24-48 hours after surgery, and you may shower at that time.  You may have steri-strips (small skin tapes) in place directly over the incision.  These strips should be left on the skin for 7-10 days.  If your surgeon used skin glue on the incision, you may shower in 24 hours.  The glue will flake off over the  next 2-3 weeks.  Any sutures or staples will be removed at the office during your follow-up visit. °8. ACTIVITIES:  You may resume regular daily activities (gradually increasing) beginning the next day.  Wearing a good support bra or sports bra minimizes pain and swelling.  You may have sexual intercourse when it is comfortable. °a. You may drive when you no longer are taking prescription pain medication, you can comfortably wear a seatbelt, and you can safely maneuver your car and apply brakes. °b. RETURN TO WORK:  ______________________________________________________________________________________ °9. You should see your doctor in the office for a follow-up appointment approximately two weeks after your surgery.  Your doctor’s nurse will typically make your follow-up appointment when she calls you with your pathology report.  Expect your pathology report 2-3 business days after your surgery.  You may call to check if you do not hear from us after three days. °10. OTHER INSTRUCTIONS: _______________________________________________________________________________________________ _____________________________________________________________________________________________________________________________________ °_____________________________________________________________________________________________________________________________________ °_____________________________________________________________________________________________________________________________________ ° °WHEN TO CALL YOUR DOCTOR: °1. Fever over 101.0 °2. Nausea and/or vomiting. °3. Extreme swelling or bruising. °4. Continued bleeding from incision. °5. Increased pain, redness, or drainage from the incision. ° °The clinic staff is available to answer your questions during regular business hours.  Please don’t hesitate to call and ask to speak to one of the nurses for clinical concerns.  If you have a medical emergency, go to the nearest  emergency room or call 911.  A surgeon from [facility] is always on call at the hospital. ° °For further questions, please visit [email or web address]  °

## 2011-07-05 NOTE — OR Nursing (Signed)
Radiologist Dr. Deboraha Sprang instructed RN to mark specimen at 6D.

## 2011-07-07 ENCOUNTER — Telehealth (INDEPENDENT_AMBULATORY_CARE_PROVIDER_SITE_OTHER): Payer: Self-pay | Admitting: General Surgery

## 2011-07-10 ENCOUNTER — Telehealth (INDEPENDENT_AMBULATORY_CARE_PROVIDER_SITE_OTHER): Payer: Self-pay

## 2011-07-10 ENCOUNTER — Telehealth (INDEPENDENT_AMBULATORY_CARE_PROVIDER_SITE_OTHER): Payer: Self-pay | Admitting: General Surgery

## 2011-07-10 ENCOUNTER — Telehealth: Payer: Self-pay | Admitting: *Deleted

## 2011-07-10 NOTE — Telephone Encounter (Signed)
Pt is calling for path result, please.  Also needs a post op appt.  Please call her at [phone].

## 2011-07-10 NOTE — Telephone Encounter (Signed)
Pt. states she is doing well after surgery.  Up and moving.  Advised no heavy lifting or pulling.  Pain level 4/10.  Takes pain med as prescribed.  Denies s/s of infection.  Reports sore/dry throat (suggested warm gargles)and minimal swelling to site.  Ice packs to site suggested but advised if condition worsens to contact surgeon's office. Pt. states [facility] called today to advise no cancer in lymph nodes. States f/u appt. surgeon's office not scheduled.  Suggested to f/u with [facility] if she does not have appt. within the next couple of days. RN asked if she had the opportunity to her review support services offered at cancer center.  Pt. stated she has not but is reviewing her journey book.  Upcoming appointments given, including nutrition 08/03/10. All questions questions answered.  Pt. verbalized understanding and agreed.

## 2011-07-10 NOTE — Telephone Encounter (Signed)
Call the patient and discussed the path report. 

## 2011-07-10 NOTE — Telephone Encounter (Signed)
Follow up appointment given to patient for 07/12/11 @ 11:00 w/Dr. Johna Sheriff.

## 2011-07-12 ENCOUNTER — Encounter: Payer: Self-pay | Admitting: Genetic Counselor

## 2011-07-12 ENCOUNTER — Encounter (INDEPENDENT_AMBULATORY_CARE_PROVIDER_SITE_OTHER): Payer: Self-pay | Admitting: General Surgery

## 2011-07-12 ENCOUNTER — Ambulatory Visit (INDEPENDENT_AMBULATORY_CARE_PROVIDER_SITE_OTHER): Payer: PRIVATE HEALTH INSURANCE | Admitting: General Surgery

## 2011-07-12 VITALS — BP 132/68 | HR 72 | Temp 97.0°F | Resp 16 | Ht 65.0 in | Wt 224.0 lb

## 2011-07-12 DIAGNOSIS — C50519 Malignant neoplasm of lower-outer quadrant of unspecified female breast: Secondary | ICD-10-CM

## 2011-07-12 NOTE — Progress Notes (Signed)
History: Patient returns for her first postop visit following left breast lumpectomy and sentinel lymph node biopsy. She is getting along well with just some expected soreness.  Exam: She appears well. Lumpectomy and sentinel node sites are healing well without complication.  We reviewed her pathology which revealed a 1 cm tumor with the closest margin being 0.6 cm. Her lymph node was negative.  Assessment and plan: Doing well following lumpectomy and sentinel node biopsy for T1 B. N0 cancer of the lower outer quadrant left breast. No further surgery required. No complications identified. She has an appointment at the cancer center next week to discussed radiation and adjuvant treatments. I will see her back in 4 months or sooner if needed

## 2011-07-17 ENCOUNTER — Telehealth: Payer: Self-pay | Admitting: Oncology

## 2011-07-17 ENCOUNTER — Ambulatory Visit (HOSPITAL_BASED_OUTPATIENT_CLINIC_OR_DEPARTMENT_OTHER): Payer: PRIVATE HEALTH INSURANCE | Admitting: Oncology

## 2011-07-17 ENCOUNTER — Encounter: Payer: Self-pay | Admitting: Oncology

## 2011-07-17 VITALS — BP 133/77 | HR 97 | Temp 98.6°F | Ht 65.0 in | Wt 224.2 lb

## 2011-07-17 DIAGNOSIS — Z170 Estrogen receptor positive status [ER+]: Secondary | ICD-10-CM

## 2011-07-17 DIAGNOSIS — C50919 Malignant neoplasm of unspecified site of unspecified female breast: Secondary | ICD-10-CM

## 2011-07-17 DIAGNOSIS — C50519 Malignant neoplasm of lower-outer quadrant of unspecified female breast: Secondary | ICD-10-CM

## 2011-07-17 DIAGNOSIS — C50419 Malignant neoplasm of upper-outer quadrant of unspecified female breast: Secondary | ICD-10-CM

## 2011-07-17 NOTE — Telephone Encounter (Signed)
gve the pt her July 2013 appt calendar °

## 2011-07-17 NOTE — Progress Notes (Signed)
OFFICE PROGRESS NOTE  CC  NADEL,SCOTT M, MD, MD [facility] [address] [employer] [address] [place] [address] Dr. Glenna Fellows Dr. Varney Baas Dr. Lonie Peak  DIAGNOSIS: 56 year old female who originally was seen in the multidisciplinary breast clinic for a stage I invasive ductal carcinoma of the left breast found on a screening mammogram.  PRIOR THERAPY:  #1 patient originally seen in the multidisciplinary breast clinic after needle core biopsy that showed a left invasive ductal carcinoma, grade 1 ER positive PR positive HER-2/neu negative with Ki-67 of 6%.  #2 patient has now gone on to have a lumpectomy of the left breast with sentinel node biopsy performed on 06/20/2011.  #3 the final pathology revealed a 1.0 cm invasive ductal carcinoma grade 1 ER positive PR positive HER-2/neu negative with Ki-67 of 6%.  CURRENT THERAPY:patient will proceed with radiation therapy to the left breast adjuvantly.  INTERVAL HISTORY: Marilyn [name] 56 y.o. female returns for followup visit today post lumpectomy. Overall she is doing well she looks terrific. She tolerated the surgery well. Her skin incisions scar is healing very well there is no evidence of nipple discharge or bleeding. Today she herself denies any fevers chills night sweats headaches shortness of breath chest pains palpitations she has no myalgias or arthralgias. She does have a little bit of breast tenderness from the surgical scar otherwise remainder of the 10 point review of systems is negative.  MEDICAL HISTORY: Past Medical History  Diagnosis Date  . History of bronchitis   . History of sarcoidosis   . Cigarette smoker   . Hyperlipidemia   . Diverticulosis of colon   . Colon polyp   . DJD (degenerative joint disease)   . Vitamin d deficiency   . Panic disorder   . Depression   . History of anemia   . Complication of anesthesia     DIFFICULTY AWAKENING  . Headache   . Incisional breast wound     AT  AGE 56 BILATERAL INCISION TO BREAST MADE  . Cancer     BREAST - left    ALLERGIES:  is allergic to penicillins.  MEDICATIONS:  Current Outpatient Prescriptions  Medication Sig Dispense Refill  . HYDROcodone-acetaminophen (NORCO) 5-325 MG per tablet Take 1 tablet by mouth every 6 (six) hours as needed.      . ALPRAZolam (XANAX) 0.5 MG tablet Take 0.5 mg by mouth daily as needed. For anxiety      . CALCIUM PO Take 1 tablet by mouth 2 (two) times daily.      . cholecalciferol (VITAMIN D) 1000 UNITS tablet Take 1,000 Units by mouth daily.      Marland Kitchen loratadine (CLARITIN REDITABS) 10 MG dissolvable tablet Take 10 mg by mouth daily.        SURGICAL HISTORY:  Past Surgical History  Procedure Date  . Total abdominal hysterectomy 2000    Dr Jennette Kettle  . Trigger finger release 2008    Dr Mina Marble  . Bunionectomy 2011    bilateral by Dr Celene Skeen  . Tonsillectomy 1972  . Cyst removed from right hand 1995  . Colonoscopy   . Breast surgery 07/05/11    left breast lumpectomy with needle loc & axillary sln bx    REVIEW OF SYSTEMS:  Pertinent items are noted in HPI.   PHYSICAL EXAMINATION: General appearance: alert, cooperative and appears stated age Resp: clear to auscultation bilaterally and normal percussion bilaterally Cardio: regular rate and rhythm, S1, S2 normal, no murmur,  click, rub or gallop GI: soft, non-tender; bowel sounds normal; no masses,  no organomegaly Extremities: extremities normal, atraumatic, no cyanosis or edema Neurologic: Grossly normal Left breast examination reveals a healing incision scar without any other masses or nipple discharge right breast no masses or nipple discharge. ECOG PERFORMANCE STATUS: 0 - Asymptomatic  Blood pressure 133/77, pulse 97, temperature 98.6 F (37 C), temperature source Oral, height 5\' 5"  (1.651 m), weight 224 lb 3.2 oz (101.696 kg).  LABORATORY DATA: Lab Results  Component Value Date   WBC 8.2 06/26/2011   HGB 12.6 06/26/2011   HCT 38.7  06/26/2011   MCV 72.9* 06/26/2011   PLT 212 06/26/2011      Chemistry      Component Value Date/Time   NA 137 06/26/2011 1112   K 4.9 06/26/2011 1112   CL 104 06/26/2011 1112   CO2 24 06/26/2011 1112   BUN 12 06/26/2011 1112   CREATININE 0.69 06/26/2011 1112      Component Value Date/Time   CALCIUM 10.2 06/26/2011 1112   ALKPHOS 90 06/14/2011 0831   AST 13 06/14/2011 0831   ALT 14 06/14/2011 0831   BILITOT 0.2* 06/14/2011 0831     ADDITIONAL INFORMATION: 1. CHROMOGENIC IN-SITU HYBRIDIZATION Interpretation HER-2/NEU BY CISH - NO AMPLIFICATION OF HER-2 DETECTED. THE RATIO OF HER-2: CEP 17 SIGNALS WAS 1.13. Reference range: Ratio: HER2:CEP17 < 1.8 - gene amplification not observed Ratio: HER2:CEP 17 1.8-2.2 - equivocal result Ratio: HER2:CEP17 > 2.2 - gene amplification observed Abigail Miyamoto MD Pathologist, Electronic Signature ( Signed 07/11/2011) FINAL DIAGNOSIS Diagnosis 1. Breast, lumpectomy, Left - INVASIVE DUCTAL CARCINOMA, GRADE I (1.0 CM). - NO LYMPHOVASCULAR INVASION IDENTIFIED. - INVASIVE TUMOR IS 0.6 CM FROM THE NEAREST MARGIN (POSTERIOR). - DUCTAL CARCINOMA IN SITU. - SEE TUMOR SYNOPTIC TEMPLATE BELOW. 2. Breast, excision, Left medial margin - BENIGN BREAST WITH SURGICAL SITE CHANGE INCLUDING HEMORRHAGE AND HEMOSIDERIN DEPOSITION SEE COMMENT. - MICROCALCIFICATIONS IN BENIGN DUCTS AND LOBULES. - NO ATYPIA OR MALIGNANCY PRESENT. 3. Lymph node, sentinel, biopsy, Left axillary - ONE LYMPH NODE, NEGATIVE FOR TUMOR (0/1). 1 of 3 FINAL for [name], [name] ([id number]) Microscopic Comment 1. BREAST, INVASIVE TUMOR, WITH LYMPH NODE SAMPLING Specimen, including laterality: Left breast Procedure: Lumpectomy Grade: I of III Tubule formation: 1 Nuclear pleomorphism: 1 Mitotic:1 Tumor size (gross measurement):1.0 cm Margins: Invasive, distance to closest margin: 0.6 cm In-situ, distance to closest margin: 0.6 cm (posterior) If margin positive, focally or broadly:  N/A Lymphovascular invasion: Absent Ductal carcinoma in situ: Present Grade: 1 of 3 Extensive intraductal component: Absent Lobular neoplasia: Absent Tumor focality: Unifocal Treatment effect: None If present, treatment effect in breast tissue, lymph nodes or both: N/A Extent of tumor: Skin: N/A Nipple: N/A Skeletal muscle: N/A Lymph nodes: # examined: 1 Lymph nodes with metastasis: 0 Breast prognostic profile: Estrogen receptor: Not repeated, previous study demonstrates 100% positivity ([id number]) Progesterone receptor: Not repeated, previous study demonstrates 100% positivity ([id number]) Her 2 neu: Repeated, previous study demonstrated no amplification (1.08) ([id number]) Ki-67: Not repeated, previous study demonstrated 6% proliferation rate ([id number]) Non-neoplastic breast: Previous biopsy site change, microcalcifications and benign ducts and lobules and fibrocystic change TNM: pT1b, pN0, pMX Comments: The final medial margin is part 2. (CR:mw 07-07-11) 2. The surgical resection margin(s) of the specimen were inked and microscopically evaluated. Additional non-neoplastic findings include benign fibrocystic change. [place] RUND DO Pathologist, Electronic Signature (Case signed 07/07/2011)  RADIOGRAPHIC STUDIES:   ASSESSMENT: 56 year old female with  #1 stage I invasive ductal carcinoma of  the left breast status post lumpectomy the final pathology revealed a 1.0 cm ER/PR positive HER-2/neu negative grade 1 disease. Sentinel node was negative for metastatic disease.  #2 overall patient is doing well. She will require radiation therapy. And I have made the referral back to Dr. Michell Heinrich.   PLAN:   #1 patient will have radiation therapy first.  #2 once she completes her radiation therapy she will go on antiestrogen therapy with Arimidex 1 mg daily for a total of 5 years.  #3 in this good risk patient there is no role for adjuvant chemotherapy per NCCN guidelines   All  questions were answered. The patient knows to call the clinic with any problems, questions or concerns. We can certainly see the patient much sooner if necessary.  I spent 25 minutes counseling the patient face to face. The total time spent in the appointment was 30 minutes.    Drue Second, MD Medical/Oncology [facility] [phone] (beeper) [phone] (Office)  07/17/2011, 1:58 PM

## 2011-07-17 NOTE — Patient Instructions (Signed)
1. Proceed with radiation therapy first.  2. I will see you back in 2 months time in follow up to discuss anti-estrogen therapy

## 2011-07-19 ENCOUNTER — Encounter: Payer: Self-pay | Admitting: *Deleted

## 2011-07-19 DIAGNOSIS — F419 Anxiety disorder, unspecified: Secondary | ICD-10-CM | POA: Insufficient documentation

## 2011-07-21 ENCOUNTER — Encounter: Payer: Self-pay | Admitting: Radiation Oncology

## 2011-07-21 ENCOUNTER — Encounter: Payer: Self-pay | Admitting: *Deleted

## 2011-07-21 ENCOUNTER — Ambulatory Visit
Admission: RE | Admit: 2011-07-21 | Discharge: 2011-07-21 | Disposition: A | Discharge status: Home / Self Care | Payer: PRIVATE HEALTH INSURANCE | Source: Ambulatory Visit | Attending: Radiation Oncology | Admitting: Radiation Oncology

## 2011-07-21 VITALS — BP 117/82 | HR 75 | Temp 98.0°F | Resp 20 | Wt 224.6 lb

## 2011-07-21 DIAGNOSIS — C50419 Malignant neoplasm of upper-outer quadrant of unspecified female breast: Secondary | ICD-10-CM

## 2011-07-21 DIAGNOSIS — C50919 Malignant neoplasm of unspecified site of unspecified female breast: Secondary | ICD-10-CM | POA: Insufficient documentation

## 2011-07-21 DIAGNOSIS — Z510 Encounter for antineoplastic radiation therapy: Secondary | ICD-10-CM | POA: Insufficient documentation

## 2011-07-21 DIAGNOSIS — C50519 Malignant neoplasm of lower-outer quadrant of unspecified female breast: Secondary | ICD-10-CM

## 2011-07-21 HISTORY — DX: Allergy, unspecified, initial encounter: T78.40XA

## 2011-07-21 NOTE — Progress Notes (Signed)
Please see the Nurse Progress Note in the MD Initial Consult Encounter for this patient. 

## 2011-07-21 NOTE — Progress Notes (Signed)
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 She underwent a lumpectomy and sentinel lymph node biopsy and 07/05/2011. The left breast tumor revealed 1.0 cm of invasive ductal carcinoma, grade 1 with no lymphovascular space invasion. Invasive tumor was 0.6 cm from the nearest margin. 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 The closest margin to the in situ disease was 0.6 cm. 0 out of one lymph nodes were positive. Her disease is ER/PR positive HER-2/neu negative. She is recovering well from surgery with occasional shooting pains in her breast. The patient has met with medical oncology and intends to start antiestrogen therapy eventually. No chemotherapy.                  The patient's sister recently died in her sleep, felt to be from sarcoidosis; this has clearly been a very difficult year for the patient.  ALLERGIES:  is allergic to penicillins.  Meds: Current Outpatient Prescriptions  Medication Sig Dispense Refill  . ALPRAZolam (XANAX) 0.5 MG tablet Take 0.5 mg by mouth daily as needed. For anxiety      . CALCIUM PO Take 1 tablet by mouth 2 (two) times daily.      . cholecalciferol (VITAMIN D) 1000 UNITS tablet Take 1,000 Units by mouth daily.      Marland Kitchen HYDROcodone-acetaminophen (NORCO) 5-325 MG per tablet Take 1 tablet by mouth every 6 (six) hours as needed.      . loratadine (CLARITIN REDITABS) 10 MG dissolvable tablet Take 10 mg by mouth daily.        Physical Findings: The patient is in no acute distress. Patient is alert and oriented.  weight is 224 lb 9.6 oz (101.878 kg). Her oral temperature is 98 F (36.7 C). Her blood pressure is 117/82 and her pulse is 75. Her  respiration is 20. Marland Kitchen   General: Alert and oriented, in no acute distress HEENT: Head is normocephalic. Pupils are equally round and reactive to light. Extraocular movements are intact. Oropharynx is clear. Neck: Neck is supple, no palpable cervical or supraclavicular lymphadenopathy. Heart: Regular in rate and rhythm with no murmurs, rubs, or gallops. Chest: Clear to auscultation bilaterally, with no rhonchi, wheezes, or rales. Abdomen: Soft, nontender, nondistended, with no rigidity or guarding. Extremities: No cyanosis ; there is trace edema in her ankles but none in her arms  Lymphatics: No concerning lymphadenopathy. Skin: No concerning lesions. Musculoskeletal: symmetric strength and muscle tone throughout. Neurologic: Cranial nerves II through XII are grossly intact. No obvious focalities. Speech is fluent. Coordination is intact. Psychiatric: Judgment and insight are intact. Affect is appropriate. Breasts: Notable for a palpable seroma in the lower outer quadrant of the left breast. The scar is healing well   Lab Findings: Lab Results  Component Value Date   WBC 8.2 06/26/2011   HGB 12.6 06/26/2011   HCT 38.7 06/26/2011   MCV 72.9* 06/26/2011   PLT 212 06/26/2011    CMP     Component Value Date/Time   NA 137 06/26/2011 1112   K 4.9 06/26/2011 1112   CL 104 06/26/2011 1112   CO2 24 06/26/2011 1112   GLUCOSE 107* 06/26/2011 1112   BUN 12 06/26/2011  1112   CREATININE 0.69 06/26/2011 1112   CALCIUM 10.2 06/26/2011 1112   PROT 7.1 06/14/2011 0831   ALBUMIN 3.5 06/14/2011 0831   AST 13 06/14/2011 0831   ALT 14 06/14/2011 0831   ALKPHOS 90 06/14/2011 0831   BILITOT 0.2* 06/14/2011 0831   GFRNONAA >90 06/26/2011 1112   GFRAA >90 06/26/2011 1112       Radiographic Findings: Nm Sentinel Node Inj-no Rpt (breast)  07/05/2011  CLINICAL DATA: cancer of the left breast   Sulfur colloid was injected intradermally by the nuclear medicine  technologist for breast cancer sentinel node localization.      Mm Breast Surgical Specimen  07/05/2011  *RADIOLOGY REPORT*  Clinical Data:  Invasive and in situ ductal carcinoma in the outer portion of the left breast posteriorly.  LEFT BREAST NEEDLE LOCALIZATION WITH MAMMOGRAPHIC GUIDANCE AND SPECIMEN RADIOGRAPH  Patient presents for needle localization prior to surgical excision. The patient and I discussed the procedure of needle localization including benefits and alternatives. We discussed the high likelihood of a successful procedure. We discussed the risks of the procedure, including infection, bleeding, tissue injury and further surgery. Informed written consent was given.  Using mammographic guidance, sterile technique, 2% lidocaine and a 7 cm modified Kopans needle, the mass and clip in the outer portion of the left breast posteriorly was localized using a lateromedial approach. Films were labeled and sent with the patient surgery. She tolerated the procedure well.  Specimen radiograph was performed at [facility] operating room and confirms the clip, mass and wire to be present in the tissue sample.  The specimen is marked for pathology.  IMPRESSION: Needle localization left breast.  No apparent complications.  Original Report Authenticated By: Daryl Eastern, M.D.   Mm Breast Wire Localization Left  07/05/2011  *RADIOLOGY REPORT*  Clinical Data:  Invasive and in situ ductal carcinoma in the outer portion of the left breast posteriorly.  LEFT BREAST NEEDLE LOCALIZATION WITH MAMMOGRAPHIC GUIDANCE AND SPECIMEN RADIOGRAPH  Patient presents for needle localization prior to surgical excision. The patient and I discussed the procedure of needle localization including benefits and alternatives. We discussed the high likelihood of a successful procedure. We discussed the risks of the procedure, including infection, bleeding, tissue injury and further surgery. Informed written consent was given.  Using mammographic guidance, sterile technique, 2% lidocaine  and a 7 cm modified Kopans needle, the mass and clip in the outer portion of the left breast posteriorly was localized using a lateromedial approach. Films were labeled and sent with the patient surgery. She tolerated the procedure well.  Specimen radiograph was performed at [facility] operating room and confirms the clip, mass and wire to be present in the tissue sample.  The specimen is marked for pathology.  IMPRESSION: Needle localization left breast.  No apparent complications.  Original Report Authenticated By: Daryl Eastern, M.D.    Impression/Plan:  She is doing well from surgery. However she has a palpable seroma that I think we need to give some time to further regress. I will schedule simulation in the middle of June with anticipation of starting treatment towards the end of June.    We discussed the risks, benefits, and side effects of whole breast radiotherapy. We discussed that radiation would take approximately 6 weeks to complete: I anticipate delivering a total dose of 60 gray in 30 fractions. We spoke about acute effects including skin irritation and fatigue as well as much less common late effects including  lung and heart irritation. We spoke about the latest technology that is used to minimize the risk of late effects for breast cancer patients undergoing radiotherapy. No guarantees of treatment were given. The patient is enthusiastic about proceeding with treatment. A consent form has been signed and placed in her chart.  I look forward to participating in the patient's care. -----------------------------------  Lonie Peak, MD

## 2011-07-21 NOTE — Progress Notes (Signed)
FUNC breast cancer 07/05/11 left lumpectomy=invasive ductal carcinoma 3 0'clock, dcis ER?PR=positiveHer2-neu neg. Widowed,   AllergiesPCN  Alert,oriented x3, shooting pain in left breast area,incision well healed,

## 2011-07-25 ENCOUNTER — Telehealth: Payer: Self-pay | Admitting: *Deleted

## 2011-07-25 NOTE — Telephone Encounter (Signed)
Spoke with pt. to address questions/concerns.  Says doing well.  Reviewed plans for radiation and upcoming appointments.  All questions answered.  She understands to call with questions.

## 2011-07-26 ENCOUNTER — Other Ambulatory Visit: Payer: Self-pay | Admitting: *Deleted

## 2011-07-26 DIAGNOSIS — C50519 Malignant neoplasm of lower-outer quadrant of unspecified female breast: Secondary | ICD-10-CM

## 2011-07-27 ENCOUNTER — Other Ambulatory Visit: Payer: PRIVATE HEALTH INSURANCE | Admitting: Lab

## 2011-07-27 DIAGNOSIS — C50519 Malignant neoplasm of lower-outer quadrant of unspecified female breast: Secondary | ICD-10-CM

## 2011-08-03 ENCOUNTER — Encounter: Payer: Self-pay | Admitting: Dietician

## 2011-08-03 ENCOUNTER — Encounter: Payer: Self-pay | Admitting: Psychiatry

## 2011-08-03 ENCOUNTER — Ambulatory Visit (INDEPENDENT_AMBULATORY_CARE_PROVIDER_SITE_OTHER): Payer: PRIVATE HEALTH INSURANCE | Admitting: Psychiatry

## 2011-08-03 DIAGNOSIS — F0630 Mood disorder due to known physiological condition, unspecified: Secondary | ICD-10-CM

## 2011-08-03 DIAGNOSIS — F4323 Adjustment disorder with mixed anxiety and depressed mood: Secondary | ICD-10-CM

## 2011-08-03 NOTE — Progress Notes (Signed)
Brief Out-patient Oncology Nutrition Note  Reason: Patient attended breast cancer nutrition class.   I have educated the patient on plant-based diet. I also discussed and provided nutrition handouts and research based evidence on breast cancer nutrition. We discussed nutrition management for symptoms associated with treatment. The class lasted a duration of 1 hour. The patient's asked good questions and were without any further nutrition related questions or concerns. RD contact information was provided. Patient's were instructed to contact RD for future nutrition questions or concerns.   RD available for nutrition needs.   Marilyn [name] [phone] 

## 2011-08-03 NOTE — Progress Notes (Signed)
08-03-2011  Patient seen for initial psychological evaluation.  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 She will call to schedule another appointment after she gets her radiation treatment protocol.

## 2011-08-04 ENCOUNTER — Other Ambulatory Visit (INDEPENDENT_AMBULATORY_CARE_PROVIDER_SITE_OTHER): Payer: Self-pay | Admitting: General Surgery

## 2011-08-08 ENCOUNTER — Ambulatory Visit (INDEPENDENT_AMBULATORY_CARE_PROVIDER_SITE_OTHER): Payer: PRIVATE HEALTH INSURANCE | Admitting: Surgery

## 2011-08-08 ENCOUNTER — Encounter (INDEPENDENT_AMBULATORY_CARE_PROVIDER_SITE_OTHER): Payer: Self-pay | Admitting: Surgery

## 2011-08-08 VITALS — BP 134/80 | HR 76 | Temp 97.8°F | Resp 16 | Ht 65.0 in | Wt 222.5 lb

## 2011-08-08 DIAGNOSIS — Z9889 Other specified postprocedural states: Secondary | ICD-10-CM

## 2011-08-08 NOTE — Progress Notes (Signed)
Chief complaint: Worried about some pain below her breast following breast surgery a few weeks ago.  History of present illness: This patient had a lumpectomy of several weeks ago. She is getting ready to start radiation. He has noticed some pulling and discomfort under her breast on the left side in the chest wall. She's not had any fevers. She's had no problems with her incision per se. She came to the urgent office to be checked. She is otherwise doing well.  Exam: Vital signs: General: The patient alert oriented healthy-appearing, no acute distress  Breasts: The left breast status post lumpectomy in the lower outer quadrant. The incision is healing nicely and is very soft. There is no redness to suggest infection. There is no fluctuance. She is nontender. The rib cage looks normal in the area where she is having discomfort which is just under the inframammary fold laterally.  Impression: Bosco skull pain uncertain etiology but does not appear to have infection or other significant problem  Plan: She can go ahead with her radiation therapy. I told her try Motrin two t.i.d. and I refilled her pain medication one last time. A semi-she does well with her radiation she will come back in four months to follow up with her surgeon here

## 2011-08-08 NOTE — Patient Instructions (Signed)
Try taking some Motrin three times daily for a few days to see if that helps. It's OK to start radiation

## 2011-08-09 ENCOUNTER — Ambulatory Visit
Admission: RE | Admit: 2011-08-09 | Discharge: 2011-08-09 | Disposition: A | Discharge status: Home / Self Care | Payer: PRIVATE HEALTH INSURANCE | Source: Ambulatory Visit | Attending: Radiation Oncology | Admitting: Radiation Oncology

## 2011-08-09 DIAGNOSIS — C50519 Malignant neoplasm of lower-outer quadrant of unspecified female breast: Secondary | ICD-10-CM

## 2011-08-09 DIAGNOSIS — C50419 Malignant neoplasm of upper-outer quadrant of unspecified female breast: Secondary | ICD-10-CM

## 2011-08-09 NOTE — Progress Notes (Signed)
Met with patient to discuss RO billing.  Patient had no concerns today. 

## 2011-08-09 NOTE — Progress Notes (Signed)
Simulation / Treatment Planning Note  The patient has a diagnosis of    stage I left breast cancer; she is status post lumpectomy. She will receive whole breast radiotherapy. The patient was laid in the supine position on the treatment table with her arms over her head. Her head was in an Accuform device. I placed adhesive wiring over her lumpectomy scar and around the borders of her breast tissue . High-resolution CT axial imaging was obtained of the patient's chest. An isocenter was placed in her anterior left lung. Skin markings were made and she tolerated the procedure well without any complications.  The patient was then scanned in the CT simulator while holding her breath. I verified with our physicist that this increased the distance between her heart and chest wall. We will treat her with the breath-hold technique and base her planning on the breath-hold images.   Treatment planning note: the patient will be treated with opposed tangential fields using MLCs for custom blocks. I plan to prescribe 50 Gray in 25 fractions to the whole breast. This will be followed by a boost the lumpectomy cavity of 10 gray in 5 fractions. I requested a DVH of the patient's lungs heart and lumpectomy cavity. I have requested an isodose plan from dosimetry.

## 2011-08-11 ENCOUNTER — Ambulatory Visit (INDEPENDENT_AMBULATORY_CARE_PROVIDER_SITE_OTHER): Payer: PRIVATE HEALTH INSURANCE | Admitting: Pulmonary Disease

## 2011-08-11 ENCOUNTER — Encounter: Payer: Self-pay | Admitting: Pulmonary Disease

## 2011-08-11 VITALS — BP 126/72 | HR 84 | Temp 97.1°F | Ht 65.0 in | Wt 228.9 lb

## 2011-08-11 DIAGNOSIS — K219 Gastro-esophageal reflux disease without esophagitis: Secondary | ICD-10-CM

## 2011-08-11 DIAGNOSIS — C50919 Malignant neoplasm of unspecified site of unspecified female breast: Secondary | ICD-10-CM

## 2011-08-11 DIAGNOSIS — T148 Other injury of unspecified body region: Secondary | ICD-10-CM

## 2011-08-11 DIAGNOSIS — T148XXA Other injury of unspecified body region, initial encounter: Secondary | ICD-10-CM

## 2011-08-11 DIAGNOSIS — D869 Sarcoidosis, unspecified: Secondary | ICD-10-CM

## 2011-08-11 DIAGNOSIS — E785 Hyperlipidemia, unspecified: Secondary | ICD-10-CM

## 2011-08-11 DIAGNOSIS — W57XXXA Bitten or stung by nonvenomous insect and other nonvenomous arthropods, initial encounter: Secondary | ICD-10-CM

## 2011-08-11 DIAGNOSIS — M940 Chondrocostal junction syndrome [Tietze]: Secondary | ICD-10-CM

## 2011-08-11 DIAGNOSIS — F419 Anxiety disorder, unspecified: Secondary | ICD-10-CM

## 2011-08-11 DIAGNOSIS — K5730 Diverticulosis of large intestine without perforation or abscess without bleeding: Secondary | ICD-10-CM

## 2011-08-11 DIAGNOSIS — I872 Venous insufficiency (chronic) (peripheral): Secondary | ICD-10-CM

## 2011-08-11 MED ORDER — TRAMADOL HCL 50 MG PO TABS: 50.0000 mg | ORAL_TABLET | Freq: Three times a day (TID) | ORAL | Status: DC | PRN

## 2011-08-11 MED ORDER — DOXYCYCLINE HYCLATE 100 MG PO TABS: 100.0000 mg | ORAL_TABLET | Freq: Two times a day (BID) | ORAL | Status: AC

## 2011-08-11 MED ORDER — ALPRAZOLAM 0.5 MG PO TABS: 0.5000 mg | ORAL_TABLET | Freq: Every day | ORAL | Status: DC | PRN

## 2011-08-11 NOTE — Patient Instructions (Addendum)
Today we updated your med list in our EPIC system...    Continue your current medications the same...  For your Chest Wall Pain>>    REST the chest- don't do any heavy lifting etc...    Apply HEAT> heating pad, hot towel, etc...    Try a deep heat cream during the day> eg. Myoflex cream, Sportscream, etc...    Use the new TRAMADOL 50mg  3 times daily 7 take it w/ an extra strength Tylenol to potentiate it's benefit...  For the Tick Bite>>    Continue your local care...    We wrote a new prescription for DOXYCYCLINE to take twice daily for 7d...  For the swelling in the ankles>>    NO SALT!!!    Elevate legs whenever possible...    Wear support hose w/ 20-1mm compression rating (put on in AM & remove in PM at bedtime...  Call for any questions...  Good luck w/ you on-going therapy.Marland KitchenMarland Kitchen

## 2011-08-12 ENCOUNTER — Encounter: Payer: Self-pay | Admitting: Pulmonary Disease

## 2011-08-12 NOTE — Progress Notes (Signed)
Subjective:    Patient ID: Evern Bio, female    DOB: January 25, 1956, 56 y.o.   MRN: [id number]  HPI 56 y/o BF here for a follow up visit... she has multiple medical problems as noted below...   ~  Sep10:  here for CPX- anxiety/ panic much improved w/ counselling and incr Zoloft by Psych, uses the Alpraz just as needed now... still smoking ~1/2 ppd and failed Chantix prev- we discussed options...  denies CP, palpit, SOB, cough/ phlegm, etc... 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 she had bilat bunion operations by [employer] & has been less active w/ 22# weight gain & we discussed incr exercise (when her boot comes off) & diet therapy... she continues to smoke 1/2 ppd & wants to try nicotine replacement Rx since a relative had success w/ this; f/u CXR remains clear... she saw [name] for f/u colonoscopy 9/11- pos for divertics & 2 polyps= polypoid mucosa, f/u planned 1yrs due to [facility]...  ~  January 23, 2011:  Yearly ROV & CPX> Hattie tells me she has had a good yr overall but upset about her weight & is going to a weight loss clinic in [place] taking Phentermine, Lipotropics, B12 shots & HCG injections "I've lost 7# the 1st month"...  She tells me she quit smoking recently, denies cough/ sputum/ hemoptysis/ dyspnea & CXR today remains clear;  BP is borderline at 142/88 but she is asymptomatic w/o CP/ palpit/ SOB/ edema/etc;  Lipids remain abn w/ LDL 148 but she doesn't want to start meds hoping that the weight loss program will help;  See prob list below>>  ~  August 11, 2011:  65mo ROV & Markeya has had a very difficult time recently> Sister w/ Hx Sarcoid & ?MS died suddenly; Husb had passed away in 08/05/03 & she has been close to his mother- now in ICU critically ill & not expected to make it; During this time [name] had a routine Mammogram w/ a lesion noted in the left breast ==> lumpectomy and sentinel lymph node biopsy 07/05/2011 by [name]; The  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     Today she is concerned about a 20# weight gain up to 229# & she will see the Oncology nutritionists soon; she has noted some swelling in her ankles assoc w/ venous insuffic & we discussed no salt, elevation, & support hose for Rx;  Finally she also removed a tick from her abd the other day> we discussed Rx w/ Doxycycline empirically.    We reviewed prob list, meds, xrays and labs> see below>>           Problem List:  PHYSICAL EXAMINATION (ICD-V70.0) - GYN= DrNeal who does her PAP smears, Mammograms, and BMD... he also checked her VitD level and started VitD 50K weekly, then changed her to 2000 u daily... she is s/p hysterectomy in 08/05/98... Note- she had an abd bruit  investigated w/ an Abd MRA 6/08 and nothing found...  had TETANUS shot 2010 at [facility] after she stepped on nail... Routine Mammogram 3/13 showed left breast lesion leading to surg by DrHoxworth, XRT by [name], Oncology f/u w/ [name]...   Hx of BRONCHITIS (ICD-490) - no recent episodes... Min cough & clear sputum... ~  Baseline CXR is clear and WNL (no residuals from her sarcoidosis) ~  Spirometry 7/06 showed FVC= 3.77 (130%), FEV1= 2.71 (113%), %1sec=72, mid-flows=66%... ~  CXR 11/12 showed normal heart size, min scarring, otherw clear, NAD...   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 totally resolved  on Pred therapy w/ normalization of CXR and no recurrence off Pred now since 08/09/90... ~  CXR 9/10 remains clear & WNL.Marland Kitchen. ~  CXR 11/11 remains clear & WNL.Marland Kitchen. ~  CXR 11/12 remains clear, mild basilar atx, otherw WNL.Marland Kitchen  CIGARETTE SMOKER (ICD-305.1) - she was smoking 1/2 ppd> states she quit 10/12 & doing well since then...  VENOUS INSUFFICIENCY (ICD-459.81) - she follows a low salt diet, elevates when nec, and wears support hose Prn.  HYPERLIPIDEMIA (ICD-272.4) - hx of hyperlipidemia w/ high HDL's prev... ~  FLP 7/07 showed TChol 210, TG 56, HDL 81, LDL 101 ~  FLP 6/08 showed TChol 232, TG 88, HDL 83, LDL 112 ~  FLP 8/09 showed TChol 199, TG 85, HDL 68, LDL 115 ~  FLP 9/10 (wt=175#) showed TChol 231, TG 78, HDL 94, LDL 120 ~  FLP 11/11 (wt=197#) showed TChol 265, TG 93, HDL 89, LDL 143... needs better diet get wt down. ~  FLP 11/12 (wt=210#) showed TChol 238, TG 79, HDL 83, LDL 148... Offered meds but she wants to see how she does on her wt loss program. ~  FLP 3/13 (wt=229#) showed TChol 235, TG 89, HDL 85, LDL 130  GERD (ICD-530.81) - uses OTC Prilosec as needed.  DIVERTICULOSIS OF COLON (ICD-562.10) IRRITABLE BOWEL SYNDROME (ICD-564.1) COLONIC POLYPS (ICD-211.3) Family Hx of COLON CANCER (ICD-153.9) -  ~  colonoscopy 9/06 by Dorris Singh was WNL- no divertics, no polyps... f/u planned 18yrs. ~  Father was diagnosed w/ colon cancer in August 09, 2007 & being treated by oncology... pt will need colon f/u in Aug 08, 2009. ~  f/u colonoscopy 9/11 by [name] showed divertics & 2 polyps= polypoid mucosa, f/u planned 54yrs.  Hx of UTI (ICD-599.0)  BREAST CANCER >> Routine mammogram 3/13 showed a lesion on the left breast >> See above:  Surg- [name],  XRT- [name],  Oncology- [name]...  DEGENERATIVE JOINT DISEASE (ICD-715.90) - she has mild DJD esp in knees & prev had cortisone shots from ortho- DrMcKinley... not on regular meds, she uses Tylenol Prn...  VITAMIN D DEFICIENCY (ICD-268.9) - followed by DrNeal  & currently taking Vit D 1000 u caps- 2 daily. ~  Labs here 11/12 showed Vit D level = 37  HEADACHE (ICD-784.0)  Hx Panic Disorder> Improved w/o attacks in several yrs; she has ALPRAZOLAM0.5mg  for prn use but seldom needs it she says... Hx of DEPRESSION (ICD-311) - husb passed away in Aug 09, 2003... she sees DrMcKinney Financial risk analyst) on ZOLOFT 100mg /d...  Hx of ANEMIA (ICD-285.9) - Hg chronically in the 11-12 range... ~  labs 9/10 showed Hg= 11.9, MCV= 77, Fe= 76 (21%sat) ~  labs 11/11 showed Hg= 12.0, MCV= 76, Fe= 80 (20%sat) ~  Labs 11/12 showed Hg= 12.1, MCV= 75, Fe=68 (19%sat) ~  Labs 4/13 showed Hg= 11.2 - 12.6  Past Surgical History  Procedure Date  . Total abdominal hysterectomy 2000    Dr Jennette Kettle  . Trigger finger release 2008    Dr Mina Marble  . Bunionectomy 2011    bilateral by Dr Celene Skeen  . Tonsillectomy 1972  . Cyst removed from right hand 1995  . Colonoscopy     polyp  . Breast surgery 07/05/11    left breast lumpectomy with needle loc & axillary sln bx  . Biopsy breast     left breast  as a teenager  benign    Outpatient Encounter Prescriptions as of 08/11/2011  Medication Sig Dispense Refill  . ALPRAZolam (XANAX) 0.5 MG tablet Take 1 tablet (0.5 mg total) by mouth daily as needed. For anxiety  90 tablet  5  . HYDROcodone-acetaminophen (NORCO) 5-325 MG per tablet TAKE 1 TO 2 TABLETS BY MOUTH EVERY 4 HOURS AS NEEDED FOR PAIN  30 tablet  0  . loratadine (CLARITIN REDITABS) 10 MG dissolvable tablet Take 10 mg by mouth daily.      Marland Kitchen DISCONTD: ALPRAZolam (XANAX) 0.5 MG tablet Take 0.5 mg by mouth daily as needed. For anxiety      . CALCIUM PO Take 1 tablet by mouth 2 (two) times daily.      . cholecalciferol (VITAMIN D) 1000 UNITS tablet Take 1,000 Units by mouth daily.      Marland Kitchen doxycycline (VIBRA-TABS) 100 MG tablet Take 1 tablet (100 mg total) by mouth 2 (two) times daily.  14 tablet  0  . traMADol (ULTRAM) 50 MG tablet Take 1 tablet (50 mg total) by mouth 3 (three) times daily  as needed for pain (as directed).  90 tablet  5  . DISCONTD: HYDROcodone-acetaminophen (NORCO) 5-325 MG per tablet as needed.        Allergies  Allergen Reactions  . Penicillins Rash    At injection site.    Current Medications, Allergies, Past Medical History, Past Surgical History, Family History, and Social History were reviewed in [employer] record.    Review of Systems    The patient notes some DOE- improved from last yr.  She denies fever, chills, sweats, anorexia, fatigue, weakness, malaise, weight loss, sleep disorder, blurring, diplopia, eye irritation, eye discharge, vision loss, eye pain, photophobia, earache, ear discharge, tinnitus, decreased hearing, nasal congestion, nosebleeds, sore throat, hoarseness, chest pain, palpitations, syncope, orthopnea, PND, peripheral edema, cough, dyspnea at rest, excessive sputum, hemoptysis, wheezing, pleurisy, nausea, vomiting, diarrhea, constipation, change in bowel habits, abdominal pain, melena, hematochezia, jaundice, gas/bloating, indigestion/heartburn, dysphagia, odynophagia, dysuria, hematuria, urinary frequency, urinary hesitancy, nocturia, incontinence, back pain, joint pain, joint swelling, muscle cramps, muscle weakness, stiffness, arthritis, sciatica, restless legs, leg pain at night, leg pain with exertion, rash, itching, dryness, suspicious lesions, paralysis, paresthesias, seizures, tremors, vertigo, transient blindness, frequent falls, frequent headaches, difficulty walking, depression, anxiety, memory loss, confusion, cold intolerance, heat intolerance, polydipsia, polyphagia, polyuria, unusual weight change, abnormal bruising, bleeding, enlarged lymph nodes, urticaria, allergic rash, hay fever, and recurrent infections.     Objective:   Physical Exam     WD, overweight, 56 y/o BF in NAD... GENERAL:  Alert & oriented; pleasant & cooperative... HEENT:  Sims/AT, EOM-wnl, PERRLA, EACs-clear, TMs-wnl,  NOSE-clear, THROAT-clear & wnl. NECK:  Supple w/ full ROM; no JVD; normal carotid impulses w/o bruits; no thyromegaly or nodules palpated; no lymphadenopathy. CHEST:  Clear to P & A; without wheezes/ rales/ or rhonchi. HEART:  Regular Rhythm; without murmurs/ rubs/ or gallops. ABDOMEN:  Soft & nontender;  normal bowel sounds; no organomegaly or masses detected... EXT: without deformities, mild arthritic changes; no varicose veins/ venous insuffic/ or edema. NEURO:  CN's intact; motor testing normal; sensory testing normal; gait normal & balance OK. DERM:  No lesions noted; no rash etc...  RADIOLOGY DATA:  Reviewed in the EPIC EMR & discussed w/ the patient...  LABORATORY DATA:  Reviewed in the EPIC EMR & discussed w/ the patient...   Assessment & Plan:   Medical issues>  We gave her some Doxycycline for the tick exposure, & discussed low salt/ elevation/ support hose for the edema...  BREAST CANCER> Chart reviewed & documented as above...   Ex-smoker, Hx Bronchits, Hx Sarcoid>  CXR remains stable, essent clear, no signs of her prev alveolar sarcoidosis; she quit smoking 10/12 she says...  Ven Insuffic>  Aware- avoid sodium, elevate, support hose...  Hyperlipid>  Poorly controlled on diet alone yet she doesn't want meds & hopeful that new diet plan will help this as well as her weight...  GI> GERD, Divertics, IBS, Polyps>  Stable on Rx, followed by [name] & up to date...  DJD>  She uses OTC analgesics as needed; notes incr symptoms w/ wt gain & hoping for improvement on diet plan...  Hx Depression>  She is followed by psychiatrist as noted, on Zoloft & prn Rebeca Allegra which she seldom takes...  Hx Anemia>  Hg stable ~12 w/ sm cells & Fe levels wnl; poss Thalassemia minor, it still wouldn't hurt to take Fe daily supplement...   Patient's Medications  New Prescriptions   DOXYCYCLINE (VIBRA-TABS) 100 MG TABLET    Take 1 tablet (100 mg total) by mouth 2 (two) times daily.   TRAMADOL  (ULTRAM) 50 MG TABLET    Take 1 tablet (50 mg total) by mouth 3 (three) times daily as needed for pain (as directed).  Previous Medications   CALCIUM PO    Take 1 tablet by mouth 2 (two) times daily.   CHOLECALCIFEROL (VITAMIN D) 1000 UNITS TABLET    Take 1,000 Units by mouth daily.   HYDROCODONE-ACETAMINOPHEN (NORCO) 5-325 MG PER TABLET    TAKE 1 TO 2 TABLETS BY MOUTH EVERY 4 HOURS AS NEEDED FOR PAIN   LORATADINE (CLARITIN REDITABS) 10 MG DISSOLVABLE TABLET    Take 10 mg by mouth daily.  Modified Medications   Modified Medication Previous Medication   ALPRAZOLAM (XANAX) 0.5 MG TABLET ALPRAZolam (XANAX) 0.5 MG tablet      Take 1 tablet (0.5 mg total) by mouth daily as needed. For anxiety    Take 0.5 mg by mouth daily as needed. For anxiety  Discontinued Medications   HYDROCODONE-ACETAMINOPHEN (NORCO) 5-325 MG PER TABLET    as needed.

## 2011-08-16 ENCOUNTER — Encounter: Payer: Self-pay | Admitting: *Deleted

## 2011-08-16 ENCOUNTER — Ambulatory Visit
Admission: RE | Admit: 2011-08-16 | Discharge: 2011-08-16 | Disposition: A | Discharge status: Home / Self Care | Payer: PRIVATE HEALTH INSURANCE | Source: Ambulatory Visit | Attending: Radiation Oncology | Admitting: Radiation Oncology

## 2011-08-16 ENCOUNTER — Encounter: Payer: Self-pay | Admitting: Radiation Oncology

## 2011-08-16 NOTE — Progress Notes (Signed)
VERIFICATION SIMULATION NOTE  The patient was laid in the correct position on the treatment table for simulation verification. Portal imaging was obtained and I verified the fields and MLCs to be accurate for her left breast. The patient tolerated the procedure well.  -----------------------------------------------------  Austine Wiedeman, MD 

## 2011-08-16 NOTE — Progress Notes (Signed)
Saw patient today in clinic.  Reports not doing too good because she has another death in family.  Reports also saw pmd for tick bite and verbalizes taking medication as prescribed. No other concerns verbalized.  Support offered.  Appointment calendar provided.  She understands to call with questions.

## 2011-08-17 ENCOUNTER — Ambulatory Visit
Admission: RE | Admit: 2011-08-17 | Discharge: 2011-08-17 | Disposition: A | Discharge status: Home / Self Care | Payer: PRIVATE HEALTH INSURANCE | Source: Ambulatory Visit | Attending: Radiation Oncology | Admitting: Radiation Oncology

## 2011-08-18 ENCOUNTER — Ambulatory Visit
Admission: RE | Admit: 2011-08-18 | Discharge: 2011-08-18 | Disposition: A | Discharge status: Home / Self Care | Payer: PRIVATE HEALTH INSURANCE | Source: Ambulatory Visit | Attending: Radiation Oncology | Admitting: Radiation Oncology

## 2011-08-21 ENCOUNTER — Ambulatory Visit
Admission: RE | Admit: 2011-08-21 | Discharge: 2011-08-21 | Disposition: A | Discharge status: Home / Self Care | Payer: PRIVATE HEALTH INSURANCE | Source: Ambulatory Visit | Attending: Radiation Oncology | Admitting: Radiation Oncology

## 2011-08-21 ENCOUNTER — Encounter: Payer: Self-pay | Admitting: Radiation Oncology

## 2011-08-21 VITALS — BP 137/84 | HR 70 | Temp 97.0°F | Wt 226.0 lb

## 2011-08-21 DIAGNOSIS — C50519 Malignant neoplasm of lower-outer quadrant of unspecified female breast: Secondary | ICD-10-CM

## 2011-08-21 DIAGNOSIS — C50919 Malignant neoplasm of unspecified site of unspecified female breast: Secondary | ICD-10-CM

## 2011-08-21 DIAGNOSIS — C50419 Malignant neoplasm of upper-outer quadrant of unspecified female breast: Secondary | ICD-10-CM

## 2011-08-21 MED ORDER — ALRA NON-METALLIC DEODORANT (RAD-ONC)
1.0000 "application " | Freq: Once | TOPICAL | Status: AC
  Administered 2011-08-21: 1 via TOPICAL

## 2011-08-21 MED ORDER — RADIAPLEXRX EX GEL
Freq: Once | CUTANEOUS | Status: AC
  Administered 2011-08-21: 1 via TOPICAL

## 2011-08-21 NOTE — Progress Notes (Signed)
Pain left lower ribs3 when sitting.  8 when moving.  Using Hydrocodone  And ultram.. Left breast with mild redness, swollen and slightly warn to touch when compared to. right breast.  Denies pain in left breast.  Post sim Education completed.

## 2011-08-21 NOTE — Progress Notes (Addendum)
Post simulation education completed to include daily schedule and registration, skin care management and fatigue management.  After education utilized teach back method to confirm Marilyn [name]'s understanding of the material presented.  Given Radiation Therapy and You booklet.  Informed that her weekly check by Dr. Basilio Cairo occurs every Monday, but she will be informed in advance if this day changes.  She is interested in smoking cessation classes and was given the Cancer Center events calendar which list the smoking cessation classes.

## 2011-08-21 NOTE — Progress Notes (Signed)
   Weekly Management Note left breast cancer; T1 C. N0, grade 1, ER positive Current Dose: 600  cGy  Projected Dose: 6000 cGy   Narrative:  The patient presents for routine under treatment assessment.  CBCT/MVCT images/Port film x-rays were reviewed.  The chart was checked. She is doing well but has some soreness in her left inframammary fold region, it seems to be arising from the rib cage. This has been sore for about 2 weeks. It responds to hydrocodone acetaminophen as well as tramadol. She recently had a tick bite and completed doxycycline for this.  Physical Findings:  weight is 226 lb (102.513 kg). Her temperature is 97 F (36.1 C). Her blood pressure is 137/84 and her pulse is 70.  no skin changes thus far over her left breast. Some tenderness to palpation in the left anterior rib cage at the inframammary fold.  Impression:  The patient is tolerating radiotherapy.  Plan:  Continue radiotherapy as planned. The cause of her soreness/ pain is unclear. I told her to take Aleve with breakfast and dinner to decrease any inflammation. Will her again next week. A bone scan could be considered in the future if this is persistent; however it's unlikely that this is related to her cancer as she has low-grade estrogen receptor positive cancer.  ________________________________   Lonie Peak, M.D.

## 2011-08-22 ENCOUNTER — Ambulatory Visit
Admission: RE | Admit: 2011-08-22 | Discharge: 2011-08-22 | Disposition: A | Discharge status: Home / Self Care | Payer: PRIVATE HEALTH INSURANCE | Source: Ambulatory Visit | Attending: Radiation Oncology | Admitting: Radiation Oncology

## 2011-08-23 ENCOUNTER — Ambulatory Visit
Admission: RE | Admit: 2011-08-23 | Discharge: 2011-08-23 | Disposition: A | Discharge status: Home / Self Care | Payer: PRIVATE HEALTH INSURANCE | Source: Ambulatory Visit | Attending: Radiation Oncology | Admitting: Radiation Oncology

## 2011-08-24 ENCOUNTER — Ambulatory Visit
Admission: RE | Admit: 2011-08-24 | Discharge: 2011-08-24 | Disposition: A | Discharge status: Home / Self Care | Payer: PRIVATE HEALTH INSURANCE | Source: Ambulatory Visit | Attending: Radiation Oncology | Admitting: Radiation Oncology

## 2011-08-25 ENCOUNTER — Ambulatory Visit
Admission: RE | Admit: 2011-08-25 | Discharge: 2011-08-25 | Disposition: A | Discharge status: Home / Self Care | Payer: PRIVATE HEALTH INSURANCE | Source: Ambulatory Visit | Attending: Radiation Oncology | Admitting: Radiation Oncology

## 2011-08-28 ENCOUNTER — Encounter: Payer: Self-pay | Admitting: Radiation Oncology

## 2011-08-28 ENCOUNTER — Ambulatory Visit
Admission: RE | Admit: 2011-08-28 | Discharge: 2011-08-28 | Disposition: A | Discharge status: Home / Self Care | Payer: PRIVATE HEALTH INSURANCE | Source: Ambulatory Visit | Attending: Radiation Oncology | Admitting: Radiation Oncology

## 2011-08-28 VITALS — BP 118/82 | HR 88 | Temp 98.6°F | Resp 20 | Wt 225.6 lb

## 2011-08-28 DIAGNOSIS — C50419 Malignant neoplasm of upper-outer quadrant of unspecified female breast: Secondary | ICD-10-CM

## 2011-08-28 DIAGNOSIS — C50519 Malignant neoplasm of lower-outer quadrant of unspecified female breast: Secondary | ICD-10-CM

## 2011-08-28 NOTE — Progress Notes (Signed)
Pt has no c/o, states left breast soreness improving since yesterday. Applying Radiaplex to left breast tx area

## 2011-08-28 NOTE — Progress Notes (Signed)
   Weekly Management Note: Left Breast Cancer,  T1 C. N0 ER positive Current Dose:  1600 cGy  Projected Dose: 6000 cGy   Narrative:  The patient presents for routine under treatment assessment.  CBCT/MVCT images/Port film x-rays were reviewed.  The chart was checked. No complaints today. The pain in the left inframammary fold/rib cage has subsided  Physical Findings:  weight is 225 lb 9.6 oz (102.331 kg). Her oral temperature is 98.6 F (37 C). Her blood pressure is 118/82 and her pulse is 88. Her respiration is 20.  no acute distress. Early hyperpigmentation throughout the left breast  Impression:  The patient is tolerating radiotherapy.  Plan:  Continue radiotherapy as planned.  ________________________________   Lonie Peak, M.D.

## 2011-08-29 ENCOUNTER — Ambulatory Visit
Admission: RE | Admit: 2011-08-29 | Discharge: 2011-08-29 | Disposition: A | Discharge status: Home / Self Care | Payer: PRIVATE HEALTH INSURANCE | Source: Ambulatory Visit | Attending: Radiation Oncology | Admitting: Radiation Oncology

## 2011-08-30 ENCOUNTER — Ambulatory Visit
Admission: RE | Admit: 2011-08-30 | Discharge: 2011-08-30 | Disposition: A | Discharge status: Home / Self Care | Payer: PRIVATE HEALTH INSURANCE | Source: Ambulatory Visit | Attending: Radiation Oncology | Admitting: Radiation Oncology

## 2011-09-01 ENCOUNTER — Ambulatory Visit
Admission: RE | Admit: 2011-09-01 | Discharge: 2011-09-01 | Disposition: A | Discharge status: Home / Self Care | Payer: PRIVATE HEALTH INSURANCE | Source: Ambulatory Visit | Attending: Radiation Oncology | Admitting: Radiation Oncology

## 2011-09-04 ENCOUNTER — Ambulatory Visit
Admission: RE | Admit: 2011-09-04 | Discharge: 2011-09-04 | Disposition: A | Discharge status: Home / Self Care | Payer: PRIVATE HEALTH INSURANCE | Source: Ambulatory Visit | Attending: Radiation Oncology | Admitting: Radiation Oncology

## 2011-09-04 VITALS — BP 106/74 | HR 81 | Temp 98.1°F | Wt 223.0 lb

## 2011-09-04 DIAGNOSIS — C50419 Malignant neoplasm of upper-outer quadrant of unspecified female breast: Secondary | ICD-10-CM

## 2011-09-04 DIAGNOSIS — C50519 Malignant neoplasm of lower-outer quadrant of unspecified female breast: Secondary | ICD-10-CM

## 2011-09-04 NOTE — Progress Notes (Signed)
   Weekly Management Note left breast cancer, T1 C. N0 ER positive Current Dose:  2400 cGy  Projected Dose: 6000 cGy   Narrative:  The patient presents for routine under treatment assessment.  CBCT/MVCT images/Port film x-rays were reviewed.  The chart was checked. Feeling tired. Using radiaplex about twice a day.  Physical Findings:  weight is 223 lb (101.152 kg). Her temperature is 98.1 F (36.7 C). Her blood pressure is 106/74 and her pulse is 81.  hyperpigmentation throughout the left breast. Skin is intact.  Impression:  The patient is tolerating radiotherapy.  Plan:  Continue radiotherapy as planned. Advised her to start using radiaplex 3 times a day. Talked about fatigue as a common side effect of radiotherapy. Discussed light exercise and ways to combat fatigue.  ________________________________   Lonie Peak, M.D.

## 2011-09-04 NOTE — Progress Notes (Signed)
Here for weekly under treat md visit for left breast radiation. Denies pain.Sleeping more and feeling sluggish.Instructed to try to increase activity(walking). Skin slightly discolored but intact.

## 2011-09-05 ENCOUNTER — Ambulatory Visit
Admission: RE | Admit: 2011-09-05 | Discharge: 2011-09-05 | Disposition: A | Discharge status: Home / Self Care | Payer: PRIVATE HEALTH INSURANCE | Source: Ambulatory Visit | Attending: Radiation Oncology | Admitting: Radiation Oncology

## 2011-09-06 ENCOUNTER — Ambulatory Visit
Admission: RE | Admit: 2011-09-06 | Discharge: 2011-09-06 | Disposition: A | Discharge status: Home / Self Care | Payer: PRIVATE HEALTH INSURANCE | Source: Ambulatory Visit | Attending: Radiation Oncology | Admitting: Radiation Oncology

## 2011-09-07 ENCOUNTER — Other Ambulatory Visit: Payer: Self-pay | Admitting: *Deleted

## 2011-09-07 ENCOUNTER — Ambulatory Visit
Admission: RE | Admit: 2011-09-07 | Discharge: 2011-09-07 | Disposition: A | Discharge status: Home / Self Care | Payer: PRIVATE HEALTH INSURANCE | Source: Ambulatory Visit | Attending: Radiation Oncology | Admitting: Radiation Oncology

## 2011-09-07 ENCOUNTER — Encounter: Payer: Self-pay | Admitting: *Deleted

## 2011-09-07 DIAGNOSIS — C50519 Malignant neoplasm of lower-outer quadrant of unspecified female breast: Secondary | ICD-10-CM

## 2011-09-07 NOTE — Progress Notes (Signed)
09/07/11 at 10:45am - The pt was into the cancer center this am for her radiation treatment.  The pt's week 3 photos were taken and uploaded to [facility] today.  The research nurse completed the ONS Acute Skin reaction form.  The pt has noted left breast hyperpigmentation.  There is no current skin breakdown.  She reports that she has some itching, but no actual breast pain.  She confirmed that she is using the Radiaplex 3 times a day.  The pt was informed of her last day of radiation assessments for 09/28/11.  She had no questions/concerns for the research nurse.  The research nurse will give the pt a copy of her 09/28/11 appointments tomorrow.  The pt was thanked for her continued support of this trial.

## 2011-09-08 ENCOUNTER — Ambulatory Visit
Admission: RE | Admit: 2011-09-08 | Discharge: 2011-09-08 | Disposition: A | Discharge status: Home / Self Care | Payer: PRIVATE HEALTH INSURANCE | Source: Ambulatory Visit | Attending: Radiation Oncology | Admitting: Radiation Oncology

## 2011-09-11 ENCOUNTER — Ambulatory Visit
Admission: RE | Admit: 2011-09-11 | Discharge: 2011-09-11 | Disposition: A | Discharge status: Home / Self Care | Payer: PRIVATE HEALTH INSURANCE | Source: Ambulatory Visit | Attending: Radiation Oncology | Admitting: Radiation Oncology

## 2011-09-11 ENCOUNTER — Encounter: Payer: Self-pay | Admitting: Radiation Oncology

## 2011-09-11 VITALS — BP 146/92 | HR 82 | Temp 97.4°F | Resp 20 | Wt 223.4 lb

## 2011-09-11 DIAGNOSIS — C50519 Malignant neoplasm of lower-outer quadrant of unspecified female breast: Secondary | ICD-10-CM

## 2011-09-11 DIAGNOSIS — C50419 Malignant neoplasm of upper-outer quadrant of unspecified female breast: Secondary | ICD-10-CM

## 2011-09-11 DIAGNOSIS — C50919 Malignant neoplasm of unspecified site of unspecified female breast: Secondary | ICD-10-CM

## 2011-09-11 MED ORDER — BIAFINE EX EMUL
Freq: Two times a day (BID) | CUTANEOUS | Status: DC
  Administered 2011-09-11: 09:00:00 via TOPICAL

## 2011-09-11 NOTE — Progress Notes (Signed)
Gave pt Biafine lotion for c/o "general itching" in tx area. Hyperpigmentation noted left breast. She c/o fatigue,. Denies pain, loss of appetite.

## 2011-09-11 NOTE — Progress Notes (Signed)
   Weekly Management Note, T1 CN 0 left breast cancer Current Dose:   34Gy  Projected Dose:  60Gy   Narrative:  The patient presents for routine under treatment assessment.  CBCT/MVCT images/Port film x-rays were reviewed.  The chart was checked.she reports some itching in the treatment area. skin is intact. she is somewhat tired. Looking forward to a trip to [place] with her significant other following completion of radiotherapy.  Physical Findings:  weight is 223 lb 6.4 oz (101.334 kg). Her oral temperature is 97.4 F (36.3 C). Her blood pressure is 146/92 and her pulse is 82. Her respiration is 20. hyperpigmentation throughout the left breast. Her skin is intact.  Impression:  The patient is tolerating radiotherapy.  Plan:  Continue radiotherapy as planned.the patient was given Biafine for her skin.  ________________________________   Lonie Peak, M.D.

## 2011-09-12 ENCOUNTER — Ambulatory Visit
Admission: RE | Admit: 2011-09-12 | Discharge: 2011-09-12 | Disposition: A | Discharge status: Home / Self Care | Payer: PRIVATE HEALTH INSURANCE | Source: Ambulatory Visit | Attending: Radiation Oncology | Admitting: Radiation Oncology

## 2011-09-13 ENCOUNTER — Ambulatory Visit
Admission: RE | Admit: 2011-09-13 | Discharge: 2011-09-13 | Disposition: A | Discharge status: Home / Self Care | Payer: PRIVATE HEALTH INSURANCE | Source: Ambulatory Visit | Attending: Radiation Oncology | Admitting: Radiation Oncology

## 2011-09-14 ENCOUNTER — Ambulatory Visit
Admission: RE | Admit: 2011-09-14 | Discharge: 2011-09-14 | Disposition: A | Discharge status: Home / Self Care | Payer: PRIVATE HEALTH INSURANCE | Source: Ambulatory Visit | Attending: Radiation Oncology | Admitting: Radiation Oncology

## 2011-09-15 ENCOUNTER — Telehealth: Payer: Self-pay | Admitting: Oncology

## 2011-09-15 ENCOUNTER — Other Ambulatory Visit (HOSPITAL_BASED_OUTPATIENT_CLINIC_OR_DEPARTMENT_OTHER): Payer: PRIVATE HEALTH INSURANCE | Admitting: Lab

## 2011-09-15 ENCOUNTER — Ambulatory Visit
Admission: RE | Admit: 2011-09-15 | Discharge: 2011-09-15 | Disposition: A | Discharge status: Home / Self Care | Payer: PRIVATE HEALTH INSURANCE | Source: Ambulatory Visit | Attending: Radiation Oncology | Admitting: Radiation Oncology

## 2011-09-15 ENCOUNTER — Encounter: Payer: Self-pay | Admitting: Radiation Oncology

## 2011-09-15 ENCOUNTER — Ambulatory Visit (HOSPITAL_BASED_OUTPATIENT_CLINIC_OR_DEPARTMENT_OTHER): Payer: PRIVATE HEALTH INSURANCE | Admitting: Oncology

## 2011-09-15 ENCOUNTER — Encounter: Payer: Self-pay | Admitting: Oncology

## 2011-09-15 VITALS — BP 144/88 | HR 92 | Temp 98.4°F | Ht 65.0 in | Wt 223.7 lb

## 2011-09-15 DIAGNOSIS — C50419 Malignant neoplasm of upper-outer quadrant of unspecified female breast: Secondary | ICD-10-CM

## 2011-09-15 DIAGNOSIS — C50919 Malignant neoplasm of unspecified site of unspecified female breast: Secondary | ICD-10-CM

## 2011-09-15 DIAGNOSIS — Z170 Estrogen receptor positive status [ER+]: Secondary | ICD-10-CM

## 2011-09-15 LAB — CBC WITH DIFFERENTIAL/PLATELET
Eosinophils Absolute: 0.1 10*3/uL (ref 0.0–0.5)
LYMPH%: 23.4 % (ref 14.0–49.7)
MONO#: 0.6 10*3/uL (ref 0.1–0.9)
NEUT#: 4 10*3/uL (ref 1.5–6.5)
Platelets: 179 10*3/uL (ref 145–400)
RBC: 4.96 10*6/uL (ref 3.70–5.45)
RDW: 14.5 % (ref 11.2–14.5)
WBC: 6.1 10*3/uL (ref 3.9–10.3)

## 2011-09-15 MED ORDER — ANASTROZOLE 1 MG PO TABS: 1.0000 mg | ORAL_TABLET | Freq: Every day | ORAL | Status: AC

## 2011-09-15 NOTE — Telephone Encounter (Signed)
gve the pt her oct 2013 appt calendar °

## 2011-09-15 NOTE — Progress Notes (Signed)
OFFICE PROGRESS NOTE  CC  NADEL,SCOTT M, MD [employer] [facility] [address] [place] [address] [place] [address] Dr. Glenna Fellows Dr. Varney Baas Dr. Lonie Peak  DIAGNOSIS: 56 year old female who originally was seen in the multidisciplinary breast clinic for a stage I invasive ductal carcinoma of the left breast found on a screening mammogram.  PRIOR THERAPY:  #1 patient originally seen in the multidisciplinary breast clinic after needle core biopsy that showed a left invasive ductal carcinoma, grade 1 ER positive PR positive HER-2/neu negative with Ki-67 of 6%.  #2 patient has now gone on to have a lumpectomy of the left breast with sentinel node biopsy performed on 06/20/2011.  #3 the final pathology revealed a 1.0 cm invasive ductal carcinoma grade 1 ER positive PR positive HER-2/neu negative with Ki-67 of 6%.  #4 patient is currently receiving radiation therapy from 08/17/2011 and will complete on 09/18/2011.  #5 after completion of radiation she will begin adjuvant antiestrogen therapy consisting of Arimidex 1 mg daily.  CURRENT THERAPY: Patient is currently receiving radiation She will finish it on 09/18/2011. After which she will start Arimidex 1 mg daily  INTERVAL HISTORY: Marilyn [name] 56 y.o. female returns for followup visit today.Overall patient is doing well she is tolerating the radiation quite well. She does have some redness to the skin. She otherwise denies any fevers chills night sweats headaches shortness of breath chest pains palpitations no myalgias or arthralgias she has some breast tenderness. Remainder of the 10 point review of systems is negative.  MEDICAL HISTORY: Past Medical History  Diagnosis Date  . History of bronchitis   . History of sarcoidosis   . Cigarette smoker   . Hyperlipidemia   . Diverticulosis of colon   . Colon polyp   . DJD (degenerative joint disease)   . Vitamin d deficiency   . Panic disorder   . Depression   .  History of anemia   . Complication of anesthesia     DIFFICULTY AWAKENING  . Headache   . Incisional breast wound     AT AGE 35 BILATERAL INCISION TO BREAST MADE  . Cancer     BREAST - left  . 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 Anxiety     panic disorder  . IBD (inflammatory bowel disease)   . GERD (gastroesophageal reflux disease)   . DJD (degenerative joint disease)   . Allergy   . Anemia     hx  . Leg swelling     ALLERGIES:  is allergic to penicillins.  MEDICATIONS:  Current Outpatient Prescriptions  Medication Sig Dispense Refill  . ALPRAZolam (XANAX) 0.5 MG tablet Take 1 tablet (0.5 mg total) by mouth daily as needed. For anxiety  90 tablet  5  . CALCIUM PO Take 1 tablet by mouth 2 (two) times daily.      . cholecalciferol (VITAMIN D) 1000 UNITS tablet Take 1,000 Units by mouth daily.      Marland Kitchen emollient (BIAFINE) cream Apply topically 2 (two) times daily.      . hyaluronate sodium (RADIAPLEXRX) GEL Apply topically once. Apply to skin 2 -3 times daily but only after treatment is completed.      Marland Kitchen HYDROcodone-acetaminophen (NORCO) 5-325 MG per tablet TAKE 1 TO 2 TABLETS BY MOUTH EVERY 4 HOURS AS NEEDED FOR PAIN  30 tablet  0  . loratadine (CLARITIN REDITABS) 10 MG dissolvable tablet Take 10  mg by mouth daily.      . non-metallic deodorant Thornton Papas) MISC Apply 1 application topically daily as needed.      . traMADol (ULTRAM) 50 MG tablet Take 1 tablet (50 mg total) by mouth 3 (three) times daily as needed for pain (as directed).  90 tablet  5    SURGICAL HISTORY:  Past Surgical History  Procedure Date  . Total abdominal hysterectomy 2000    Dr Jennette Kettle  . Trigger finger release 2008    Dr Mina Marble  . Bunionectomy 2011    bilateral by Dr Celene Skeen  . Tonsillectomy 1972  . Cyst removed from right hand 1995  . Colonoscopy     polyp  . Breast surgery 07/05/11    left breast lumpectomy with needle loc & axillary sln bx    . Biopsy breast     left breast  as a teenager  benign    REVIEW OF SYSTEMS:  Pertinent items are noted in HPI.   PHYSICAL EXAMINATION: General appearance: alert, cooperative and appears stated age Resp: clear to auscultation bilaterally and normal percussion bilaterally Cardio: regular rate and rhythm, S1, S2 normal, no murmur, click, rub or gallop GI: soft, non-tender; bowel sounds normal; no masses,  no organomegaly Extremities: extremities normal, atraumatic, no cyanosis or edema Neurologic: Grossly normal Left breast examination reveals a healing incision scar without any other masses or nipple discharge right breast no masses or nipple discharge. ECOG PERFORMANCE STATUS: 0 - Asymptomatic  Blood pressure 144/88, pulse 92, temperature 98.4 F (36.9 C), temperature source Oral, height 5\' 5"  (1.651 m), weight 223 lb 11.2 oz (101.47 kg).  LABORATORY DATA: Lab Results  Component Value Date   WBC 6.1 09/15/2011   HGB 11.8 09/15/2011   HCT 36.4 09/15/2011   MCV 73.5* 09/15/2011   PLT 179 09/15/2011      Chemistry      Component Value Date/Time   NA 137 06/26/2011 1112   K 4.9 06/26/2011 1112   CL 104 06/26/2011 1112   CO2 24 06/26/2011 1112   BUN 12 06/26/2011 1112   CREATININE 0.69 06/26/2011 1112      Component Value Date/Time   CALCIUM 10.2 06/26/2011 1112   ALKPHOS 90 06/14/2011 0831   AST 13 06/14/2011 0831   ALT 14 06/14/2011 0831   BILITOT 0.2* 06/14/2011 0831     ADDITIONAL INFORMATION: 1. CHROMOGENIC IN-SITU HYBRIDIZATION Interpretation HER-2/NEU BY CISH - NO AMPLIFICATION OF HER-2 DETECTED. THE RATIO OF HER-2: CEP 17 SIGNALS WAS 1.13. Reference range: Ratio: HER2:CEP17 < 1.8 - gene amplification not observed Ratio: HER2:CEP 17 1.8-2.2 - equivocal result Ratio: HER2:CEP17 > 2.2 - gene amplification observed Abigail Miyamoto MD Pathologist, Electronic Signature ( Signed 07/11/2011) FINAL DIAGNOSIS Diagnosis 1. Breast, lumpectomy, Left - INVASIVE DUCTAL CARCINOMA,  GRADE I (1.0 CM). - NO LYMPHOVASCULAR INVASION IDENTIFIED. - INVASIVE TUMOR IS 0.6 CM FROM THE NEAREST MARGIN (POSTERIOR). - DUCTAL CARCINOMA IN SITU. - SEE TUMOR SYNOPTIC TEMPLATE BELOW. 2. Breast, excision, Left medial margin - BENIGN BREAST WITH SURGICAL SITE CHANGE INCLUDING HEMORRHAGE AND HEMOSIDERIN DEPOSITION SEE COMMENT. - MICROCALCIFICATIONS IN BENIGN DUCTS AND LOBULES. - NO ATYPIA OR MALIGNANCY PRESENT. 3. Lymph node, sentinel, biopsy, Left axillary - ONE LYMPH NODE, NEGATIVE FOR TUMOR (0/1). 1 of 3 FINAL for Marilyn, [name] ([id number]) Microscopic Comment 1. BREAST, INVASIVE TUMOR, WITH LYMPH NODE SAMPLING Specimen, including laterality: Left breast Procedure: Lumpectomy Grade: I of III Tubule formation: 1 Nuclear pleomorphism: 1 Mitotic:1 Tumor size (gross measurement):1.0 cm  Margins: Invasive, distance to closest margin: 0.6 cm In-situ, distance to closest margin: 0.6 cm (posterior) If margin positive, focally or broadly: N/A Lymphovascular invasion: Absent Ductal carcinoma in situ: Present Grade: 1 of 3 Extensive intraductal component: Absent Lobular neoplasia: Absent Tumor focality: Unifocal Treatment effect: None If present, treatment effect in breast tissue, lymph nodes or both: N/A Extent of tumor: Skin: N/A Nipple: N/A Skeletal muscle: N/A Lymph nodes: # examined: 1 Lymph nodes with metastasis: 0 Breast prognostic profile: Estrogen receptor: Not repeated, previous study demonstrates 100% positivity ([id number]) Progesterone receptor: Not repeated, previous study demonstrates 100% positivity ([id number]) Her 2 neu: Repeated, previous study demonstrated no amplification (1.08) ([id number]) Ki-67: Not repeated, previous study demonstrated 6% proliferation rate ([id number]) Non-neoplastic breast: Previous biopsy site change, microcalcifications and benign ducts and lobules and fibrocystic change TNM: pT1b, pN0, pMX Comments: The final medial margin  is part 2. (CR:mw 07-07-11) 2. The surgical resection margin(s) of the specimen were inked and microscopically evaluated. Additional non-neoplastic findings include benign fibrocystic change. [place] RUND DO Pathologist, Electronic Signature (Case signed 07/07/2011)  RADIOGRAPHIC STUDIES:   ASSESSMENT: 56 year old female with  #1 stage I invasive ductal carcinoma of the left breast status post lumpectomy the final pathology revealed a 1.0 cm ER/PR positive HER-2/neu negative grade 1 disease. Sentinel node was negative for metastatic disease.  #2 Patient will complete her radiation on 09/18/2011. After that she will begin adjuvant antiestrogen therapy with Arimidex 1 mg daily. A prescription was sent to her pharmacy. Risks and benefits of Arimidex were explained to her very clearly and literature has been given to her.   PLAN:   #1 complete radiation on 722 and then begin Arimidex 1 mg daily.  #3 patient will be seen back in 3 months time. She knows to call with any problems.  All questions were answered. The patient knows to call the clinic with any problems, questions or concerns. We can certainly see the patient much sooner if necessary.  I spent 25 minutes counseling the patient face to face. The total time spent in the appointment was 30 minutes.    Drue Second, MD Medical/Oncology [facility] [phone] (beeper) [phone] (Office)  09/15/2011, 1:54 PM

## 2011-09-15 NOTE — Patient Instructions (Addendum)
1. Now you begin arimidex 1 mg daily. Information on it as below.  2. Quit smoking please  3. I will see you back in 3 months

## 2011-09-18 ENCOUNTER — Ambulatory Visit
Admission: RE | Admit: 2011-09-18 | Discharge: 2011-09-18 | Disposition: A | Discharge status: Home / Self Care | Payer: PRIVATE HEALTH INSURANCE | Source: Ambulatory Visit | Attending: Radiation Oncology | Admitting: Radiation Oncology

## 2011-09-19 ENCOUNTER — Ambulatory Visit
Admission: RE | Admit: 2011-09-19 | Discharge: 2011-09-19 | Disposition: A | Discharge status: Home / Self Care | Payer: PRIVATE HEALTH INSURANCE | Source: Ambulatory Visit | Attending: Radiation Oncology | Admitting: Radiation Oncology

## 2011-09-19 DIAGNOSIS — C50419 Malignant neoplasm of upper-outer quadrant of unspecified female breast: Secondary | ICD-10-CM

## 2011-09-19 NOTE — Progress Notes (Signed)
HERE TODAY FOR PUT OF LEFT BREAST.  SKIN LOOKS GOOD WITH NO DESQUAMATION NOTED, SKIN A LITTLE DARKER.  C/O ITCHING AT TX SITE, USING RADIAPLEX AND BIAFINE.  NO OTHER C/O.   T  97.2.....BP.....137/86.......77

## 2011-09-19 NOTE — Progress Notes (Signed)
Weekly Management Note:  Site:L Breast Current Dose:  4600  cGy Projected Dose: 6000  cGy  Narrative: The patient is seen today for routine under treatment assessment. CBCT/MVCT images/port films were reviewed. The chart was reviewed.   She is without complaints today. She uses Radioplex and Biafine.  Physical Examination: There were no vitals filed for this visit..  Weight:  . There is marked hyperpigmentation the skin along the left breast with no areas of desquamation.  Impression: Tolerating radiation therapy well.  Plan: Continue radiation therapy as planned.

## 2011-09-20 ENCOUNTER — Ambulatory Visit
Admission: RE | Admit: 2011-09-20 | Discharge: 2011-09-20 | Disposition: A | Discharge status: Home / Self Care | Payer: PRIVATE HEALTH INSURANCE | Source: Ambulatory Visit | Attending: Radiation Oncology | Admitting: Radiation Oncology

## 2011-09-21 ENCOUNTER — Ambulatory Visit
Admission: RE | Admit: 2011-09-21 | Discharge: 2011-09-21 | Disposition: A | Discharge status: Home / Self Care | Payer: PRIVATE HEALTH INSURANCE | Source: Ambulatory Visit | Attending: Radiation Oncology | Admitting: Radiation Oncology

## 2011-09-22 ENCOUNTER — Ambulatory Visit
Admission: RE | Admit: 2011-09-22 | Discharge: 2011-09-22 | Disposition: A | Discharge status: Home / Self Care | Payer: PRIVATE HEALTH INSURANCE | Source: Ambulatory Visit | Attending: Radiation Oncology | Admitting: Radiation Oncology

## 2011-09-22 ENCOUNTER — Encounter: Payer: Self-pay | Admitting: Radiation Oncology

## 2011-09-25 ENCOUNTER — Encounter: Payer: Self-pay | Admitting: Radiation Oncology

## 2011-09-25 ENCOUNTER — Ambulatory Visit
Admission: RE | Admit: 2011-09-25 | Discharge: 2011-09-25 | Disposition: A | Discharge status: Home / Self Care | Payer: PRIVATE HEALTH INSURANCE | Source: Ambulatory Visit | Attending: Radiation Oncology | Admitting: Radiation Oncology

## 2011-09-25 VITALS — BP 127/84 | HR 80 | Temp 97.6°F | Resp 20 | Wt 239.0 lb

## 2011-09-25 DIAGNOSIS — C50519 Malignant neoplasm of lower-outer quadrant of unspecified female breast: Secondary | ICD-10-CM

## 2011-09-25 DIAGNOSIS — C50419 Malignant neoplasm of upper-outer quadrant of unspecified female breast: Secondary | ICD-10-CM

## 2011-09-25 NOTE — Progress Notes (Signed)
   Weekly Management Note Current Dose:   54Gy  Projected Dose:  60Gy   Narrative:  The patient presents for routine under treatment assessment.  CBCT/MVCT images/Port film x-rays were reviewed.  The chart was checked. She is doing well. She is going on vacation the day after completing radiotherapy.  Physical Findings:  weight is 239 lb (108.41 kg). Her oral temperature is 97.6 F (36.4 C). Her blood pressure is 127/84 and her pulse is 80. Her respiration is 20.  left breast demonstrates hyperpigmentation. Skin is intact.  Impression:  The patient is tolerating radiotherapy.  Plan:  Continue radiotherapy as planned. Patient will be scheduled for one month followup. She will see one of my partners on her last day of treatment.  ________________________________   Lonie Peak, M.D.

## 2011-09-25 NOTE — Progress Notes (Signed)
Patient alert,oriented x3, hyperpigmentation left breast, no desquamtion, occasional zaps in l breast area, 3 more txs Slight fatigue at times 8:53 AM

## 2011-09-25 NOTE — Progress Notes (Signed)
VERIFICATION SIMULATION NOTE Date of service 09-22-11  The patient was laid in the correct position on the treatment table for simulation verification. Portal imaging was obtained and I verified the fields and MLCs for her lumpectomy bed boost to be accurate. The patient tolerated the procedure well.  -----------------------------------------------------  Lonie Peak, MD

## 2011-09-26 ENCOUNTER — Ambulatory Visit
Admission: RE | Admit: 2011-09-26 | Discharge: 2011-09-26 | Disposition: A | Discharge status: Home / Self Care | Payer: PRIVATE HEALTH INSURANCE | Source: Ambulatory Visit | Attending: Radiation Oncology | Admitting: Radiation Oncology

## 2011-09-27 ENCOUNTER — Ambulatory Visit
Admission: RE | Admit: 2011-09-27 | Discharge: 2011-09-27 | Disposition: A | Discharge status: Home / Self Care | Payer: PRIVATE HEALTH INSURANCE | Source: Ambulatory Visit | Attending: Radiation Oncology | Admitting: Radiation Oncology

## 2011-09-28 ENCOUNTER — Telehealth: Payer: Self-pay | Admitting: *Deleted

## 2011-09-28 ENCOUNTER — Encounter: Payer: Self-pay | Admitting: *Deleted

## 2011-09-28 ENCOUNTER — Ambulatory Visit
Admission: RE | Admit: 2011-09-28 | Discharge: 2011-09-28 | Disposition: A | Discharge status: Home / Self Care | Payer: PRIVATE HEALTH INSURANCE | Source: Ambulatory Visit | Attending: Radiation Oncology | Admitting: Radiation Oncology

## 2011-09-28 ENCOUNTER — Other Ambulatory Visit: Payer: PRIVATE HEALTH INSURANCE | Admitting: Lab

## 2011-09-28 ENCOUNTER — Encounter: Payer: Self-pay | Admitting: Radiation Oncology

## 2011-09-28 VITALS — BP 111/75 | HR 85 | Temp 97.9°F

## 2011-09-28 DIAGNOSIS — C50419 Malignant neoplasm of upper-outer quadrant of unspecified female breast: Secondary | ICD-10-CM

## 2011-09-28 DIAGNOSIS — C50519 Malignant neoplasm of lower-outer quadrant of unspecified female breast: Secondary | ICD-10-CM

## 2011-09-28 LAB — RESEARCH LABS

## 2011-09-28 MED ORDER — RADIAPLEXRX EX GEL
Freq: Once | CUTANEOUS | Status: AC
  Administered 2011-09-28: 1 via TOPICAL

## 2011-09-28 MED ORDER — ALRA NON-METALLIC DEODORANT (RAD-ONC)
1.0000 "application " | Freq: Once | TOPICAL | Status: AC
  Administered 2011-09-28: 1 via TOPICAL

## 2011-09-28 NOTE — Progress Notes (Signed)
09/28/11 at 9:42am - [facility] [id number] - Last Day of Radiation visit.  The pt was into the cancer center this am for her final radiation treatment.  The pt completed and returned her questionnaires.  Her photos were taken this am and will be uploaded tomorrow by the research assistant.  The research nurse assessed the pt's skin integrity with Irish Elders, radiation nurse, this am and completed the ONS Criteria for Radiation Induced Acute Skin Reaction.  It was determined the pt has a grade 2 skin toxicity.  No areas of dry desquamation was noted.  The pt has some bright erythema and some tenderness around her nipple and underarms.  The pt then had her research specimens (blood and urine) obtained.  The samples will be shipped this afternoon for overnight delivery.  The research nurse has asked the dosimetrist to assist in the completion of the Breast Chest Wall RT form.      10/04/11 at 2:52pm- Chip Boer in radiation completed the Breast Chest Wall RT form and forwarded it to the research nurse.  The research nurse then faxed all of the pt's last day of RT assessments to [facility] data management.

## 2011-09-28 NOTE — Progress Notes (Signed)
Patient completes 30 of 30 radiation treatments to left breast.Will continue application of radiaplex gel 2 to 3 times daily. On research protocol.Survey and pictures completed today.One month follow up scheduled for 10/27/11 at 10:20 am. Skin with hyperpigmentation but no peeling.Generalized  fatigue relieved with daily nap.

## 2011-09-28 NOTE — Progress Notes (Signed)
   Department of Radiation Oncology  Phone:  [phone] Fax:        [phone]  Weekly Treatment Note    Name: Marilyn [name] Date: 09/28/2011 MRN: [id number] DOB: June 13, 1955   Current dose: 60 Gy  Current fraction: 30   MEDICATIONS: Current Outpatient Prescriptions  Medication Sig Dispense Refill  . ALPRAZolam (XANAX) 0.5 MG tablet Take 1 tablet (0.5 mg total) by mouth daily as needed. For anxiety  90 tablet  5  . CALCIUM PO Take 1 tablet by mouth 2 (two) times daily.      . cholecalciferol (VITAMIN D) 1000 UNITS tablet Take 1,000 Units by mouth daily.      Marland Kitchen emollient (BIAFINE) cream Apply topically 2 (two) times daily.      . hyaluronate sodium (RADIAPLEXRX) GEL Apply topically once. Apply to skin 2 -3 times daily but only after treatment is completed.      . loratadine (CLARITIN REDITABS) 10 MG dissolvable tablet Take 10 mg by mouth daily.      . non-metallic deodorant Thornton Papas) MISC Apply 1 application topically daily as needed.      Marland Kitchen anastrozole (ARIMIDEX) 1 MG tablet Take 1 tablet (1 mg total) by mouth daily.  90 tablet  12  . HYDROcodone-acetaminophen (NORCO) 5-325 MG per tablet TAKE 1 TO 2 TABLETS BY MOUTH EVERY 4 HOURS AS NEEDED FOR PAIN  30 tablet  0  . traMADol (ULTRAM) 50 MG tablet Take 1 tablet (50 mg total) by mouth 3 (three) times daily as needed for pain (as directed).  90 tablet  5   Current Facility-Administered Medications  Medication Dose Route Frequency Provider Last Rate Last Dose  . hyaluronate sodium (RADIAPLEXRX) gel   Topical Once Lonie Peak, MD   1 application at 09/28/11 0944  . non-metallic deodorant Thornton Papas) 1 application  1 application Topical Once Lonie Peak, MD   1 application at 09/28/11 0944     ALLERGIES: Penicillins   LABORATORY DATA:  Lab Results  Component Value Date   WBC 6.1 09/15/2011   HGB 11.8 09/15/2011   HCT 36.4 09/15/2011   MCV 73.5* 09/15/2011   PLT 179 09/15/2011   Lab Results  Component Value Date   NA 137  06/26/2011   K 4.9 06/26/2011   CL 104 06/26/2011   CO2 24 06/26/2011   Lab Results  Component Value Date   ALT 14 06/14/2011   AST 13 06/14/2011   ALKPHOS 90 06/14/2011   BILITOT 0.2* 06/14/2011     NARRATIVE: Marilyn [name] was seen today for weekly treatment management. The chart was checked and the patient's films were reviewed. The patient is doing well. She finished her final fraction today. She has been on protocol and pictures were completed today. She does complain of some generalized fatigue but overall feels quite well.  PHYSICAL EXAMINATION: temperature is 97.9 F (36.6 C). Her blood pressure is 111/75 and her pulse is 85.      patient's skin looks quite good for her final day. Hyperpigmentation diffusely in the treatment area which is a little greater in the upper axilla. No desquamation.  ASSESSMENT: The patient did very well with her treatment.  PLAN: Followup with Dr. Basilio Cairo as has been scheduled.

## 2011-09-29 ENCOUNTER — Ambulatory Visit: Payer: PRIVATE HEALTH INSURANCE

## 2011-09-29 NOTE — Progress Notes (Signed)
  Radiation Oncology         [phone] ________________________________  Name: Marilyn [name] MRN: [id number]  Date: 09/28/2011  DOB: Nov 11, 1955  End of Treatment Note  Diagnosis:  [id number] left lower outer quadrant invasive ductal carcinoma, ER PR positive HER-2/neu negative  Indication for treatment: curative  Radiation treatment dates:  08/17/2011-09/28/2011  Site/dose:  1) Left Breast/ 50Gy/25 fractions  2) Left breast boost / 10 Gy/5 fractions   Beams/energy:    1) Breathhold  tangents / 6 and 10 MV photons  2) 3 fields / 6 MV photons  Narrative: The patient tolerated radiation treatment relatively well.  Thepatient's skin looked quite good for her final day. Hyperpigmentation diffusely in the treatment area which is a little greater in the upper axilla. No desquamation.    Plan: The patient has completed radiation treatment. The patient will return to radiation oncology clinic for routine followup in one month. I advised them to call or return sooner if they have any questions or concerns related to their recovery or treatment.  -----------------------------------  Lonie Peak, MD

## 2011-10-02 ENCOUNTER — Ambulatory Visit: Payer: PRIVATE HEALTH INSURANCE

## 2011-10-03 ENCOUNTER — Ambulatory Visit: Payer: PRIVATE HEALTH INSURANCE

## 2011-10-13 ENCOUNTER — Encounter (INDEPENDENT_AMBULATORY_CARE_PROVIDER_SITE_OTHER): Payer: PRIVATE HEALTH INSURANCE | Admitting: General Surgery

## 2011-10-13 ENCOUNTER — Ambulatory Visit (INDEPENDENT_AMBULATORY_CARE_PROVIDER_SITE_OTHER): Payer: PRIVATE HEALTH INSURANCE | Admitting: General Surgery

## 2011-10-13 DIAGNOSIS — C50519 Malignant neoplasm of lower-outer quadrant of unspecified female breast: Secondary | ICD-10-CM

## 2011-10-13 NOTE — Progress Notes (Signed)
Chief complaint: Followup left breast cancer  History: Patient returns for followup approximately 4 months following left breast lumpectomy and negative sentinel lymph node biopsy for T1 B. N0 invasive cancer of the left breast. She has completed her radiation without problems other than a little bit of skin peeling and started adjuvant anastrozole last week. She is felt just a little bit tired and occasional nausea with this medication but of course she is just starting. No arm swelling or changes in her breast that conservator.  Exam: General: Appears well Lymph nodes: No cervical, subclavicular or axillary nodes palpable Breasts: Moderate hyperpigmentation of the left breast. There is some mild thickening beneath the lumpectomy site in the lower outer quadrant but no mass. No other palpable abnormalities.  Assessment and plan: Doing well without evidence of complication or early recurrence. I asked her to return in 6 months.

## 2011-10-23 ENCOUNTER — Encounter: Payer: Self-pay | Admitting: Radiation Oncology

## 2011-10-23 NOTE — Progress Notes (Addendum)
Photon Boost Treatment Planning and complex simulation Note  Diagnosis: Breast Cancer  The patient's CT images from her free-breathing simulation were reviewed to plan her boost treatment to her left breast  lumpectomy cavity.  The boost to the lumpectomy cavity will be delivered with 3 photon fields using MLCs for custom blocks with 6 MV photon energy.

## 2011-10-27 ENCOUNTER — Ambulatory Visit
Admission: RE | Admit: 2011-10-27 | Discharge: 2011-10-27 | Disposition: A | Discharge status: Home / Self Care | Payer: PRIVATE HEALTH INSURANCE | Source: Ambulatory Visit | Attending: Radiation Oncology | Admitting: Radiation Oncology

## 2011-10-27 ENCOUNTER — Encounter: Payer: Self-pay | Admitting: Radiation Oncology

## 2011-10-27 ENCOUNTER — Encounter: Payer: Self-pay | Admitting: *Deleted

## 2011-10-27 VITALS — BP 136/76 | HR 89 | Temp 97.7°F | Wt 225.4 lb

## 2011-10-27 DIAGNOSIS — C50419 Malignant neoplasm of upper-outer quadrant of unspecified female breast: Secondary | ICD-10-CM

## 2011-10-27 DIAGNOSIS — C50519 Malignant neoplasm of lower-outer quadrant of unspecified female breast: Secondary | ICD-10-CM

## 2011-10-27 HISTORY — DX: Personal history of irradiation: Z92.3

## 2011-10-27 NOTE — Progress Notes (Signed)
  Radiation Oncology         [phone] ________________________________  Name: Marilyn [name] MRN: [id number]  Date: 10/27/2011  DOB: May 07, 1955  Follow-Up Visit Note  CC: Michele Mcalpine, MD  Michele Mcalpine, MD  Diagnosis:   Pathologic T1 BN 0 M0 left lower outer quadrant invasive ductal carcinoma, ER/PR positive HER-2/neu negative  Interval Since Last Radiation:  She completed 60 gray in 30 fractions on 09/28/2011 to her left breast  Narrative:  The patient returns today for routine follow-up. She is doing well. She is taking anastrozole. She is tolerating that reasonably well but feels warmer than usual and has had some mild lower extremity edema.     She feels a little fatigued as well.                  ALLERGIES:  is allergic to penicillins.  Meds: Current Outpatient Prescriptions  Medication Sig Dispense Refill  . ALPRAZolam (XANAX) 0.5 MG tablet Take 1 tablet (0.5 mg total) by mouth daily as needed. For anxiety  90 tablet  5  . cholecalciferol (VITAMIN D) 1000 UNITS tablet Take 1,000 Units by mouth daily.      Marland Kitchen emollient (BIAFINE) cream Apply topically 2 (two) times daily.      . hyaluronate sodium (RADIAPLEXRX) GEL Apply topically once. Apply to skin 2 -3 times daily but only after treatment is completed.      . loratadine (CLARITIN REDITABS) 10 MG dissolvable tablet Take 10 mg by mouth daily.      . non-metallic deodorant Thornton Papas) MISC Apply 1 application topically daily as needed.      . traMADol (ULTRAM) 50 MG tablet Take 1 tablet (50 mg total) by mouth 3 (three) times daily as needed for pain (as directed).  90 tablet  5  . anastrozole (ARIMIDEX) 1 MG tablet Take 1 mg by mouth daily.       Marland Kitchen CALCIUM PO Take 1 tablet by mouth 2 (two) times daily.        Physical Findings: The patient is in no acute distress. Patient is alert and oriented.  weight is 225 lb 6.4 oz (102.241 kg). Her temperature is 97.7 F (36.5 C). Her blood pressure is 136/76 and her pulse is 89. Marland Kitchen  Left  breast shows resolving hyperpigmentation. The left nipple shows a little bit of swelling around one of the ducts distally; it appears one of the ducts may have become a little plugged due to desquamation.  Lab Findings: Lab Results  Component Value Date   WBC 6.1 09/15/2011   HGB 11.8 09/15/2011   HCT 36.4 09/15/2011   MCV 73.5* 09/15/2011   PLT 179 09/15/2011     Radiographic Findings: No results found.  Impression:  The patient is recovering from the effects of radiation.   Plan:  I told the patient she consider using vitamin E. lotion or cream or even topical application of oil from vitamin E capsules to promote further healing of moisturization of her left breast. I will see her  back on an as-needed basis. I gave her information on the finding your new normal class. She will continue anastrozole and to be followed by medical oncology. She knows that she should not hesitate to call should any issues arise in the future. _____________________________________   Lonie Peak, MD

## 2011-10-27 NOTE — Progress Notes (Signed)
Met with pt. today to discuss problems/concerns. She does not verbalize any concerns at this time.  Rates care at the center as a  "10".  Relates everyone was supportive. She is excited about completing her treatments and moving forward.  She has plans to follow-up with the research nurse today as well.  She does understand to call with questions. Rn to follow-up.

## 2011-10-27 NOTE — Progress Notes (Signed)
10/27/11 at 12:51pm - [facility] [id number] - 1 month post RT follow-up note- The pt was into the clinic this am for her 1 month post RT visit.  The pt stated that she has been doing well.  She reports that her left breast has been healing from the effects of RT.  She does report some continued tenderness around her nipple.  She does have some hyperpigmentation of the left breast.  The research nurse completed the ONS Criteria for Randiation-Induced Acute Skin Reaction form.  Her photos were taken and uploaded to [facility] today.  The pt also completed and returned all of her required questionnaires.  She was seen and examined today by Dr. Karoline Caldwell.   The pt reported starting anastrozole this month after her RT completion.  She denies using any new skin products on her affected breast.  The pt has not had a recurrence of her breast cancer.  The pt's 2 month post RT appointment has been scheduled for 11/28/11 at 10am.

## 2011-11-28 ENCOUNTER — Encounter: Payer: Self-pay | Admitting: *Deleted

## 2011-11-28 ENCOUNTER — Encounter: Payer: PRIVATE HEALTH INSURANCE | Admitting: *Deleted

## 2011-11-28 NOTE — Progress Notes (Signed)
11/28/11 at 10:22am - [facility] [id number] - 2 months post RT visit  - The pt was into the cancer center this am for her 2 months post RT assessments.  The pt states she has been doing well.  She said that her breast has healed well over the past 2 months.  She states she still has some occasional itching around her affected breast.  The research nurse noted that she has some darker skin around her left nipple.  The pt states she only is using vitamin E lotion on her affected breast now.  The research nurse completed the ONS Acute Skin Reaction form.  The pt also had her photos taken today and uploaded to [facility] data management.  The pt was given her 6 months post RT appt for 04/05/12 today.  The nurse thanked the pt for her continued support of the trial.  The pt also spoke to Dr. Welton Flakes briefly today.  Dr. Welton Flakes reassured the pt that she was fine to get her annual flu shot now.

## 2011-12-15 ENCOUNTER — Encounter: Payer: Self-pay | Admitting: *Deleted

## 2011-12-18 ENCOUNTER — Encounter: Payer: Self-pay | Admitting: Pulmonary Disease

## 2011-12-18 ENCOUNTER — Other Ambulatory Visit (INDEPENDENT_AMBULATORY_CARE_PROVIDER_SITE_OTHER): Payer: PRIVATE HEALTH INSURANCE

## 2011-12-18 ENCOUNTER — Ambulatory Visit (INDEPENDENT_AMBULATORY_CARE_PROVIDER_SITE_OTHER): Payer: PRIVATE HEALTH INSURANCE | Admitting: Pulmonary Disease

## 2011-12-18 ENCOUNTER — Ambulatory Visit (INDEPENDENT_AMBULATORY_CARE_PROVIDER_SITE_OTHER)
Admission: RE | Admit: 2011-12-18 | Discharge: 2011-12-18 | Disposition: A | Discharge status: Home / Self Care | Payer: PRIVATE HEALTH INSURANCE | Source: Ambulatory Visit | Attending: Pulmonary Disease | Admitting: Pulmonary Disease

## 2011-12-18 VITALS — BP 130/82 | HR 82 | Temp 97.1°F | Ht 65.0 in | Wt 229.0 lb

## 2011-12-18 DIAGNOSIS — F17200 Nicotine dependence, unspecified, uncomplicated: Secondary | ICD-10-CM

## 2011-12-18 DIAGNOSIS — K5730 Diverticulosis of large intestine without perforation or abscess without bleeding: Secondary | ICD-10-CM

## 2011-12-18 DIAGNOSIS — K219 Gastro-esophageal reflux disease without esophagitis: Secondary | ICD-10-CM

## 2011-12-18 DIAGNOSIS — D869 Sarcoidosis, unspecified: Secondary | ICD-10-CM

## 2011-12-18 DIAGNOSIS — K589 Irritable bowel syndrome without diarrhea: Secondary | ICD-10-CM

## 2011-12-18 DIAGNOSIS — D126 Benign neoplasm of colon, unspecified: Secondary | ICD-10-CM

## 2011-12-18 DIAGNOSIS — D649 Anemia, unspecified: Secondary | ICD-10-CM

## 2011-12-18 DIAGNOSIS — F419 Anxiety disorder, unspecified: Secondary | ICD-10-CM

## 2011-12-18 DIAGNOSIS — E559 Vitamin D deficiency, unspecified: Secondary | ICD-10-CM

## 2011-12-18 DIAGNOSIS — M1990 Unspecified osteoarthritis, unspecified site: Secondary | ICD-10-CM

## 2011-12-18 DIAGNOSIS — C50419 Malignant neoplasm of upper-outer quadrant of unspecified female breast: Secondary | ICD-10-CM

## 2011-12-18 DIAGNOSIS — E785 Hyperlipidemia, unspecified: Secondary | ICD-10-CM

## 2011-12-18 DIAGNOSIS — F411 Generalized anxiety disorder: Secondary | ICD-10-CM

## 2011-12-18 DIAGNOSIS — I872 Venous insufficiency (chronic) (peripheral): Secondary | ICD-10-CM

## 2011-12-18 LAB — HEPATIC FUNCTION PANEL
ALT: 18 U/L (ref 0–35)
AST: 18 U/L (ref 0–37)
Bilirubin, Direct: 0.1 mg/dL (ref 0.0–0.3)
Total Protein: 7.4 g/dL (ref 6.0–8.3)

## 2011-12-18 LAB — LIPID PANEL
Cholesterol: 214 mg/dL — ABNORMAL HIGH (ref 0–200)
HDL: 59.6 mg/dL (ref >39.00)
Total CHOL/HDL Ratio: 4
Triglycerides: 114 mg/dL (ref 0.0–149.0)
VLDL: 22.8 mg/dL (ref 0.0–40.0)

## 2011-12-18 LAB — CBC WITH DIFFERENTIAL/PLATELET
Basophils Absolute: 0 10*3/uL (ref 0.0–0.1)
Basophils Relative: 0.3 % (ref 0.0–3.0)
Eosinophils Absolute: 0.1 10*3/uL (ref 0.0–0.7)
Lymphocytes Relative: 16.3 % (ref 12.0–46.0)
MCHC: 31.8 g/dL (ref 30.0–36.0)
Monocytes Absolute: 0.7 10*3/uL (ref 0.1–1.0)
Neutrophils Relative %: 72.9 % (ref 43.0–77.0)
Platelets: 231 10*3/uL (ref 150.0–400.0)
RBC: 5.1 Mil/uL (ref 3.87–5.11)
RDW: 14.7 % — ABNORMAL HIGH (ref 11.5–14.6)

## 2011-12-18 LAB — LDL CHOLESTEROL, DIRECT: Direct LDL: 118.9 mg/dL

## 2011-12-18 LAB — BASIC METABOLIC PANEL
BUN: 11 mg/dL (ref 6–23)
CO2: 27 mEq/L (ref 19–32)
Calcium: 9.7 mg/dL (ref 8.4–10.5)
Chloride: 106 mEq/L (ref 96–112)
Creatinine, Ser: 0.6 mg/dL (ref 0.4–1.2)
GFR: 122.66 mL/min (ref >60.00)
Glucose, Bld: 93 mg/dL (ref 70–99)
Potassium: 4.4 mEq/L (ref 3.5–5.1)
Sodium: 139 mEq/L (ref 135–145)

## 2011-12-18 LAB — TSH: TSH: 1.65 u[IU]/mL (ref 0.35–5.50)

## 2011-12-18 NOTE — Progress Notes (Signed)
Subjective:    Patient ID: Marilyn [name], female    DOB: Sep 17, 1955, 56 y.o.   MRN: [id number]  HPI 56 y/o BF here for a follow up visit... she has multiple medical problems as noted below...   ~  January 23, 2011:  Yearly ROV & CPX> [name] tells me she has had a good yr overall but upset about her weight & is going to a weight loss clinic in [place] taking Phentermine, Lipotropics, B12 shots & HCG injections "I've lost 7# the 1st month"...  She tells me she quit smoking recently, denies cough/ sputum/ hemoptysis/ dyspnea & CXR today remains clear;  BP is borderline at 142/88 but she is asymptomatic w/o CP/ palpit/ SOB/ edema/etc;  Lipids remain abn w/ LDL 148 but she doesn't want to start meds hoping that the weight loss program will help;  See prob list below>>  ~  August 11, 2011:  19mo ROV & Marilyn [name] has had a very difficult time recently> Sister w/ Hx Sarcoid & ?MS died suddenly; Husb had passed away in 08/19/2003 & she has been close to his mother- now in ICU critically ill & not expected to make it; During this time Marilyn [name] had a routine Mammogram w/ a lesion noted in the left breast ==> lumpectomy and sentinel lymph node biopsy 07/05/2011 by [name]; The left breast tumor revealed 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     Today she is concerned about a 20# weight gain up to 229# & she will see the Oncology nutritionists soon; she has noted some swelling in her ankles assoc w/ venous insuffic & we discussed no  salt, elevation, & support hose for Rx;  Finally she also removed a tick from her abd the other day> we discussed Rx w/ Doxycycline empirically.    We reviewed prob list, meds, xrays and labs> see below>>  ~  December 18, 2011:  57mo ROV & Marilyn [name] is essent stable- notes tired, no energy, incr weight, some DOE, she is s/p 30 XRT treatments from [name], now on Arimidex 1mg  per [name]...  We reviewed the following medical problems during today's OV>>     Bronchitis, Sarcoid, Smoker> still smoking 1/2-1ppd; notes mild cough, sm amt beige phlegm, no CP, +Sob/Doe    VI> trace edema; she knows to elim sodium, elev legs, wear support hose, not on diuretics as yet...    Chol> on diet alone; FLP shows TChol 214, TG 114, HDL 60, LDL 119    Overweight> she increased from 160#=> 229# due to Zoloft rx she says, then exac by foot surg & immobility...    GI- GERD, Divertics, IBS, Polyp, +FamHx> she continues to do well 7 denies abd pain, n/v, d/c, blood seen, etc...    Breast Cancer> on Arimidex per [name]...     DJD, VitD> off Tramadol, on OTC meds as needed; she notes some left hand paresthesias- rec wrist splint...    Headaches> no recent problems, she uses OTC meds as needed.Marland KitchenMarland Kitchen  Anxiety, Panic> on Alpraz0.5mg  prn...    Anemia> CBC showed Hg 11.8=> 12.2 We reviewed prob list, meds, xrays and labs> see below for updates >>  CXR 10/13 showed normal heart size, atherosclerotic calcif, clear lungs, surg clips within the left breast... LABS 10/13:  FLP- on diet alone w/ TChol=214 LDL 119;  Chems- wnl;  CBC- ok w/ Hg=12.2 MCV=75 & rec to start on Fe;  TSH=1.65;  VitD=26           Problem List:  PHYSICAL EXAMINATION (ICD-V70.0) - GYN= DrNeal who does her PAP smears, Mammograms, and BMD... he also checked her VitD level and started VitD 50K weekly, then changed her to 2000 u daily... she is s/p hysterectomy in 2000... Note- she had an abd bruit investigated w/ an Abd MRA 6/08 and nothing found...  had TETANUS  shot 2010 [name] after she stepped on nail... Routine Mammogram 3/13 showed left breast lesion leading to surg by DrHoxworth, XRT by [name], Oncology f/u w/ [name]...   Hx of BRONCHITIS (ICD-490) - no recent episodes... Min cough & clear sputum... ~  Baseline CXR is clear and WNL (no residuals from her sarcoidosis) ~  Spirometry 7/06 showed FVC= 3.77 (130%), FEV1= 2.71 (113%), %1sec=72, mid-flows=66%... ~  CXR 11/12 showed normal heart size, min scarring, otherw clear, NAD.Marland Kitchen.  ~  CXR 10/13 showed normal heart size, atherosclerotic calcif, clear lungs, surg clips within the left breast...  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 totally resolved on Pred therapy w/ normalization of CXR and no recurrence off Pred now since 1992... ~  CXR 9/10 remains clear & WNL.Marland Kitchen. ~  CXR 11/11 remains clear & WNL.Marland Kitchen. ~  CXR 11/12 remains clear, mild basilar atx, otherw WNL.Marland Kitchen. ~  CXR 10/13 showed normal heart size, atherosclerotic calcif, clear lungs, surg clips within the left breast...  CIGARETTE SMOKER (ICD-305.1) - she was smoking 1/2 ppd> states she quit 10/12 & now back to 1/2-1ppd!!!  VENOUS INSUFFICIENCY (ICD-459.81) - she follows a low salt diet, elevates when nec, and wears support hose Prn.  HYPERLIPIDEMIA (ICD-272.4) - hx of hyperlipidemia w/ high HDL's prev... ~  FLP 7/07 showed TChol 210, TG 56, HDL 81, LDL 101 ~  FLP 6/08 showed TChol 232, TG 88, HDL 83, LDL 112 ~  FLP 8/09 showed TChol 199, TG 85, HDL 68, LDL 115 ~  FLP 9/10 (wt=175#) showed TChol 231, TG 78, HDL 94, LDL 120 ~  FLP 11/11 (wt=197#) showed TChol 265, TG 93, HDL 89, LDL 143... needs better diet get wt down. ~  FLP 11/12 (wt=210#) showed TChol 238, TG 79, HDL 83, LDL 148... Offered meds but she wants to see how she does on her wt loss program. ~  FLP 3/13 (wt=229#) showed TChol 235, TG 89, HDL 85, LDL 130 ~  FLP 10/13 (wt=229#) showed TChol 214,  TG 114, HDL 60, LDL 119   GERD (ICD-530.81) - uses OTC Prilosec as needed.  DIVERTICULOSIS OF COLON (ICD-562.10) IRRITABLE BOWEL SYNDROME (ICD-564.1) COLONIC POLYPS (ICD-211.3) Family Hx of COLON CANCER (ICD-153.9) -  ~  colonoscopy 9/06 by Dorris Singh was WNL- no divertics, no polyps... f/u planned 70yrs. ~  Father was diagnosed w/ colon cancer in 2009 & being treated by oncology... pt will need colon f/u in 2011. ~  f/u colonoscopy 9/11 by [name] showed divertics & 2 polyps= polypoid mucosa, f/u planned 33yrs.  Hx of UTI (ICD-599.0)  BREAST  CANCER >> Routine mammogram 3/13 showed a lesion on the left breast >> See above:  Surg- [name],  XRT- [name],  Oncology- [name]...  DEGENERATIVE JOINT DISEASE (ICD-715.90) - she has mild DJD esp in knees & prev had cortisone shots from ortho- DrMcKinley... not on regular meds, she uses Tylenol Prn...  VITAMIN D DEFICIENCY (ICD-268.9) - followed by DrNeal & currently taking Vit D 1000 u caps- 2 daily. ~  Labs here 11/12 showed Vit D level = 37  HEADACHE (ICD-784.0)  Hx Panic Disorder> Improved w/o attacks in several yrs; she has ALPRAZOLAM0.5mg  for prn use but seldom needs it she says... Hx of DEPRESSION (ICD-311) - husb passed away in 09-Aug-2003... she sees DrMcKinney [employer] risk analyst) on ZOLOFT 100mg /d...  Hx of ANEMIA (ICD-285.9) - Hg chronically in the 11-12 range... ~  labs 9/10 showed Hg= 11.9, MCV= 77, Fe= 76 (21%sat) ~  labs 11/11 showed Hg= 12.0, MCV= 76, Fe= 80 (20%sat) ~  Labs 11/12 showed Hg= 12.1, MCV= 75, Fe=68 (19%sat) ~  Labs 4/13 showed Hg= 11.2 - 12.6 ~  Labs 10/13 showed Hg= 12.2, MCV= 75, Rec to start Fe daily...   Past Surgical History  Procedure Date  . Total abdominal hysterectomy 1998-08-09    Dr Jennette Kettle  . Trigger finger release 08-09-06    Dr Mina Marble  . Bunionectomy 08-08-09    bilateral by Dr Celene Skeen  . Tonsillectomy 1972  . Cyst removed from right hand 1995  . Colonoscopy     polyp  . Breast surgery 07/05/11    left breast  lumpectomy with needle loc & axillary sln bx  . Biopsy breast     left breast  as a teenager  benign    Outpatient Encounter Prescriptions as of 12/18/2011  Medication Sig Dispense Refill  . ALPRAZolam (XANAX) 0.5 MG tablet Take 1 tablet (0.5 mg total) by mouth daily as needed. For anxiety  90 tablet  5  . anastrozole (ARIMIDEX) 1 MG tablet Take 1 mg by mouth daily.       Marland Kitchen emollient (BIAFINE) cream Apply topically 2 (two) times daily.      . hyaluronate sodium (RADIAPLEXRX) GEL Apply topically once. Apply to skin 2 -3 times daily but only after treatment is completed.      . loratadine (CLARITIN REDITABS) 10 MG dissolvable tablet Take 10 mg by mouth daily.      . non-metallic deodorant Thornton Papas) MISC Apply 1 application topically daily as needed.      Marland Kitchen CALCIUM PO Take 1 tablet by mouth 2 (two) times daily.      . cholecalciferol (VITAMIN D) 1000 UNITS tablet Take 1,000 Units by mouth daily.      Marland Kitchen DISCONTD: traMADol (ULTRAM) 50 MG tablet Take 1 tablet (50 mg total) by mouth 3 (three) times daily as needed for pain (as directed).  90 tablet  5    Allergies  Allergen Reactions  . Penicillins Rash    At injection site.    Current Medications, Allergies, Past Medical History, Past Surgical History, Family History, and Social History were reviewed in [employer] record.    Review of Systems    The patient notes some DOE- improved from last yr.  She denies fever, chills, sweats, anorexia, fatigue, weakness, malaise, weight loss, sleep disorder, blurring, diplopia, eye irritation, eye discharge, vision loss, eye pain, photophobia, earache, ear discharge, tinnitus, decreased hearing, nasal congestion, nosebleeds, sore throat, hoarseness, chest pain, palpitations, syncope, orthopnea, PND, peripheral edema, cough, dyspnea at  rest, excessive sputum, hemoptysis, wheezing, pleurisy, nausea, vomiting, diarrhea, constipation, change in bowel habits, abdominal pain, melena,  hematochezia, jaundice, gas/bloating, indigestion/heartburn, dysphagia, odynophagia, dysuria, hematuria, urinary frequency, urinary hesitancy, nocturia, incontinence, back pain, joint pain, joint swelling, muscle cramps, muscle weakness, stiffness, arthritis, sciatica, restless legs, leg pain at night, leg pain with exertion, rash, itching, dryness, suspicious lesions, paralysis, paresthesias, seizures, tremors, vertigo, transient blindness, frequent falls, frequent headaches, difficulty walking, depression, anxiety, memory loss, confusion, cold intolerance, heat intolerance, polydipsia, polyphagia, polyuria, unusual weight change, abnormal bruising, bleeding, enlarged lymph nodes, urticaria, allergic rash, hay fever, and recurrent infections.     Objective:   Physical Exam     WD, overweight, 56 y/o BF in NAD... GENERAL:  Alert & oriented; pleasant & cooperative... HEENT:  [place]/AT, EOM-wnl, PERRLA, EACs-clear, TMs-wnl, NOSE-clear, THROAT-clear & wnl. NECK:  Supple w/ full ROM; no JVD; normal carotid impulses w/o bruits; no thyromegaly or nodules palpated; no lymphadenopathy. CHEST:  Clear to P & A; without wheezes/ rales/ or rhonchi. HEART:  Regular Rhythm; without murmurs/ rubs/ or gallops. ABDOMEN:  Soft & nontender; normal bowel sounds; no organomegaly or masses detected... EXT: without deformities, mild arthritic changes; no varicose veins/ venous insuffic/ or edema. NEURO:  CN's intact; motor testing normal; sensory testing normal; gait normal & balance OK. DERM:  No lesions noted; no rash etc...  RADIOLOGY DATA:  Reviewed in the EPIC EMR & discussed w/ the patient...  LABORATORY DATA:  Reviewed in the EPIC EMR & discussed w/ the patient...   Assessment & Plan:    Ex-smoker, Hx Bronchits, Hx Sarcoid>  CXR remains stable, essent clear, no signs of her prev alveolar sarcoidosis; she quit smoking 10/12 she says...  Ven Insuffic>  Aware- avoid sodium, elevate, support  hose...  Hyperlipid>  Poorly controlled on diet alone yet she doesn't want meds & hopeful that new diet plan will help this as well as her weight...  GI> GERD, Divertics, IBS, Polyps>  Stable on Rx, followed by [name] & up to date...  BREAST CANCER> followed by [name] on Arimidex...  DJD>  She uses OTC analgesics as needed; notes incr symptoms w/ wt gain & hoping for improvement on diet plan...  Hx Depression>  She was followed by psychiatrist prev on Zoloft & on prn Rebeca Allegra which she seldom takes...  Hx Anemia>  Hg stable ~12 w/ sm cells & Fe levels wnl; poss Thalassemia minor, it still wouldn't hurt to take Fe daily supplement...   Patient's Medications  New Prescriptions   BUPROPION (WELLBUTRIN XL) 150 MG 24 HR TABLET    Take 1 tablet (150 mg total) by mouth daily.  Previous Medications   ALPRAZOLAM (XANAX) 0.5 MG TABLET    Take 1 tablet (0.5 mg total) by mouth daily as needed. For anxiety   ANASTROZOLE (ARIMIDEX) 1 MG TABLET    Take 1 mg by mouth daily.    CALCIUM PO    Take 1 tablet by mouth 2 (two) times daily.   CHOLECALCIFEROL (VITAMIN D) 1000 UNITS TABLET    Take 1,000 Units by mouth daily.   EMOLLIENT (BIAFINE) CREAM    Apply topically 2 (two) times daily.   HYALURONATE SODIUM (RADIAPLEXRX) GEL    Apply topically once. Apply to skin 2 -3 times daily but only after treatment is completed.   LORATADINE (CLARITIN REDITABS) 10 MG DISSOLVABLE TABLET    Take 10 mg by mouth daily.   NON-METALLIC DEODORANT (ALRA) MISC    Apply 1 application topically daily as needed.  Modified Medications   No medications on file  Discontinued Medications   TRAMADOL (ULTRAM) 50 MG TABLET    Take 1 tablet (50 mg total) by mouth 3 (three) times daily as needed for pain (as directed).

## 2011-12-18 NOTE — Patient Instructions (Addendum)
Today we updated your med list in our EPIC system...    Continue your current medications the same...  Today we did your follow up CXR & FASTING blood work...    We will communicate the results to you...    They will also be avail to [facility] in our EPIC system...  Let's get on track w/ our diet & exercise program...  Call for any questions...  Let's plan a follow up visit in 6 months, sooner if needed for problems.Marland KitchenMarland Kitchen

## 2011-12-19 LAB — VITAMIN D 25 HYDROXY (VIT D DEFICIENCY, FRACTURES): Vit D, 25-Hydroxy: 26 ng/mL — ABNORMAL LOW (ref 30–89)

## 2011-12-22 ENCOUNTER — Ambulatory Visit (HOSPITAL_BASED_OUTPATIENT_CLINIC_OR_DEPARTMENT_OTHER): Payer: PRIVATE HEALTH INSURANCE | Admitting: Oncology

## 2011-12-22 ENCOUNTER — Encounter: Payer: Self-pay | Admitting: Oncology

## 2011-12-22 ENCOUNTER — Ambulatory Visit (HOSPITAL_BASED_OUTPATIENT_CLINIC_OR_DEPARTMENT_OTHER): Payer: PRIVATE HEALTH INSURANCE | Admitting: Lab

## 2011-12-22 ENCOUNTER — Other Ambulatory Visit (HOSPITAL_BASED_OUTPATIENT_CLINIC_OR_DEPARTMENT_OTHER): Payer: PRIVATE HEALTH INSURANCE | Admitting: Lab

## 2011-12-22 ENCOUNTER — Telehealth: Payer: Self-pay | Admitting: *Deleted

## 2011-12-22 VITALS — BP 137/86 | HR 85 | Temp 97.9°F | Resp 20 | Ht 65.0 in | Wt 229.2 lb

## 2011-12-22 DIAGNOSIS — C50419 Malignant neoplasm of upper-outer quadrant of unspecified female breast: Secondary | ICD-10-CM

## 2011-12-22 DIAGNOSIS — C50519 Malignant neoplasm of lower-outer quadrant of unspecified female breast: Secondary | ICD-10-CM

## 2011-12-22 DIAGNOSIS — Z170 Estrogen receptor positive status [ER+]: Secondary | ICD-10-CM

## 2011-12-22 MED ORDER — BUPROPION HCL ER (XL) 150 MG PO TB24: 150.0000 mg | ORAL_TABLET | Freq: Every day | ORAL | Status: DC

## 2011-12-22 NOTE — Patient Instructions (Addendum)
Wellbutrin 150mg  daily  Take vitamin D3 5000 iu daily  Refer to lymphedema clinic  I will see you back in January 2014

## 2011-12-22 NOTE — Progress Notes (Signed)
OFFICE PROGRESS NOTE  CC  NADEL,SCOTT M, MD [employer] [address] [place] [address] [place] [address] Dr. Glenna Fellows Dr. Varney Baas Dr. Lonie Peak  DIAGNOSIS: 56 year old female who originally was seen in the multidisciplinary breast clinic for a stage I invasive ductal carcinoma of the left breast found on a screening mammogram.  PRIOR THERAPY:  #1 patient originally seen in the multidisciplinary breast clinic after needle core biopsy that showed a left invasive ductal carcinoma, grade 1 ER positive PR positive HER-2/neu negative with Ki-67 of 6%.  #2 patient has now gone on to have a lumpectomy of the left breast with sentinel node biopsy performed on 06/20/2011.  #3 the final pathology revealed a 1.0 cm invasive ductal carcinoma grade 1 ER positive PR positive HER-2/neu negative with Ki-67 of 6%.  #4 patient is currently receiving radiation therapy from 08/17/2011 and will complete on 09/18/2011.  #5 she will begun adjuvant antiestrogen therapy consisting of Arimidex 1 mg daily Beginning July 2013 her  CURRENT THERAPY: Arimidex 1 mg daily since July 2013.  INTERVAL HISTORY: Marilyn [name] 56 y.o. female returns for followup visit today.Overall patient is doing well she is tolerating arimidex quite well. . She otherwise denies any fevers chills night sweats headaches shortness of breath chest pains palpitations no myalgias or arthralgias she has some breast tenderness. Remainder of the 10 point review of systems is negative.  MEDICAL HISTORY: Past Medical History  Diagnosis Date  . History of bronchitis   . History of sarcoidosis   . Cigarette smoker   . Hyperlipidemia   . Diverticulosis of colon   . Colon polyp   . DJD (degenerative joint disease)   . Vitamin D deficiency   . Panic disorder   . Depression   . History of anemia   . Complication of anesthesia     DIFFICULTY AWAKENING  . Headache   . Incisional breast wound     AT AGE 73  BILATERAL INCISION TO BREAST MADE  . Cancer     BREAST - left  . 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 Anxiety     panic disorder  . IBD (inflammatory bowel disease)   . GERD (gastroesophageal reflux disease)   . DJD (degenerative joint disease)   . Allergy   . Anemia     hx  . Leg swelling   . S/P radiation therapy 08/17/11 - 09/28/11    LLQ - 50 Gy/25 Fractions with Boost of 10 Gy / 5 fractions    ALLERGIES:  is allergic to penicillins.  MEDICATIONS:  Current Outpatient Prescriptions  Medication Sig Dispense Refill  . ALPRAZolam (XANAX) 0.5 MG tablet Take 1 tablet (0.5 mg total) by mouth daily as needed. For anxiety  90 tablet  5  . anastrozole (ARIMIDEX) 1 MG tablet Take 1 mg by mouth daily.       Marland Kitchen CALCIUM PO Take 1 tablet by mouth 2 (two) times daily.      . cholecalciferol (VITAMIN D) 1000 UNITS tablet Take 1,000 Units by mouth daily.      Marland Kitchen emollient (BIAFINE) cream Apply topically 2 (two) times daily.      . hyaluronate sodium (RADIAPLEXRX) GEL Apply topically once. Apply to skin 2 -3 times daily but only after treatment is completed.      . loratadine (CLARITIN REDITABS) 10 MG dissolvable tablet Take 10 mg by mouth daily.      Marland Kitchen  non-metallic deodorant (ALRA) MISC Apply 1 application topically daily as needed.        SURGICAL HISTORY:  Past Surgical History  Procedure Date  . Total abdominal hysterectomy 2000    Dr Jennette Kettle  . Trigger finger release 2008    Dr Mina Marble  . Bunionectomy 2011    bilateral by Dr Celene Skeen  . Tonsillectomy 1972  . Cyst removed from right hand 1995  . Colonoscopy     polyp  . Breast surgery 07/05/11    left breast lumpectomy with needle loc & axillary sln bx  . Biopsy breast     left breast  as a teenager  benign    REVIEW OF SYSTEMS:  Pertinent items are noted in HPI.   PHYSICAL EXAMINATION: General appearance: alert, cooperative and appears stated age Resp: clear to auscultation  bilaterally and normal percussion bilaterally Cardio: regular rate and rhythm, S1, S2 normal, no murmur, click, rub or gallop GI: soft, non-tender; bowel sounds normal; no masses,  no organomegaly Extremities: extremities normal, atraumatic, no cyanosis or edema Neurologic: Grossly normal Left breast examination reveals a healing incision scar without any other masses or nipple discharge right breast no masses or nipple discharge. ECOG PERFORMANCE STATUS: 0 - Asymptomatic  Blood pressure 137/86, pulse 85, temperature 97.9 F (36.6 C), temperature source Oral, resp. rate 20, height 5\' 5"  (1.651 m), weight 229 lb 3.2 oz (103.964 kg).  LABORATORY DATA: Lab Results  Component Value Date   WBC 7.5 12/18/2011   HGB 12.2 12/18/2011   HCT 38.3 12/18/2011   MCV 75.1* 12/18/2011   PLT 231.0 12/18/2011      Chemistry      Component Value Date/Time   NA 139 12/18/2011 1138   K 4.4 12/18/2011 1138   CL 106 12/18/2011 1138   CO2 27 12/18/2011 1138   BUN 11 12/18/2011 1138   CREATININE 0.6 12/18/2011 1138      Component Value Date/Time   CALCIUM 9.7 12/18/2011 1138   ALKPHOS 90 12/18/2011 1138   AST 18 12/18/2011 1138   ALT 18 12/18/2011 1138   BILITOT 0.4 12/18/2011 1138     ADDITIONAL INFORMATION: 1. CHROMOGENIC IN-SITU HYBRIDIZATION Interpretation HER-2/NEU BY CISH - NO AMPLIFICATION OF HER-2 DETECTED. THE RATIO OF HER-2: CEP 17 SIGNALS WAS 1.13. Reference range: Ratio: HER2:CEP17 < 1.8 - gene amplification not observed Ratio: HER2:CEP 17 1.8-2.2 - equivocal result Ratio: HER2:CEP17 > 2.2 - gene amplification observed Abigail Miyamoto MD Pathologist, Electronic Signature ( Signed 07/11/2011) FINAL DIAGNOSIS Diagnosis 1. Breast, lumpectomy, Left - INVASIVE DUCTAL CARCINOMA, GRADE I (1.0 CM). - NO LYMPHOVASCULAR INVASION IDENTIFIED. - INVASIVE TUMOR IS 0.6 CM FROM THE NEAREST MARGIN (POSTERIOR). - DUCTAL CARCINOMA IN SITU. - SEE TUMOR SYNOPTIC TEMPLATE BELOW. 2. Breast,  excision, Left medial margin - BENIGN BREAST WITH SURGICAL SITE CHANGE INCLUDING HEMORRHAGE AND HEMOSIDERIN DEPOSITION SEE COMMENT. - MICROCALCIFICATIONS IN BENIGN DUCTS AND LOBULES. - NO ATYPIA OR MALIGNANCY PRESENT. 3. Lymph node, sentinel, biopsy, Left axillary - ONE LYMPH NODE, NEGATIVE FOR TUMOR (0/1). 1 of 3 FINAL for Marilyn [name], Marilyn [name] ([id number]) Microscopic Comment 1. BREAST, INVASIVE TUMOR, WITH LYMPH NODE SAMPLING Specimen, including laterality: Left breast Procedure: Lumpectomy Grade: I of III Tubule formation: 1 Nuclear pleomorphism: 1 Mitotic:1 Tumor size (gross measurement):1.0 cm Margins: Invasive, distance to closest margin: 0.6 cm In-situ, distance to closest margin: 0.6 cm (posterior) If margin positive, focally or broadly: N/A Lymphovascular invasion: Absent Ductal carcinoma in situ: Present Grade: 1 of 3 Extensive intraductal component:  Absent Lobular neoplasia: Absent Tumor focality: Unifocal Treatment effect: None If present, treatment effect in breast tissue, lymph nodes or both: N/A Extent of tumor: Skin: N/A Nipple: N/A Skeletal muscle: N/A Lymph nodes: # examined: 1 Lymph nodes with metastasis: 0 Breast prognostic profile: Estrogen receptor: Not repeated, previous study demonstrates 100% positivity ([id number]) Progesterone receptor: Not repeated, previous study demonstrates 100% positivity ([id number]) Her 2 neu: Repeated, previous study demonstrated no amplification (1.08) ([id number]) Ki-67: Not repeated, previous study demonstrated 6% proliferation rate ([id number]) Non-neoplastic breast: Previous biopsy site change, microcalcifications and benign ducts and lobules and fibrocystic change TNM: pT1b, pN0, pMX Comments: The final medial margin is part 2. (CR:mw 07-07-11) 2. The surgical resection margin(s) of the specimen were inked and microscopically evaluated. Additional non-neoplastic findings include benign fibrocystic change. [place] RUND  DO Pathologist, Electronic Signature (Case signed 07/07/2011)  RADIOGRAPHIC STUDIES:   ASSESSMENT: 56 year old female with  #1 stage I invasive ductal carcinoma of the left breast status post lumpectomy the final pathology revealed a 1.0 cm ER/PR positive HER-2/neu negative grade 1 disease. Sentinel node was negative for metastatic disease.Patient completed her radiation therapy on 09/18/2011. She was then begun on adjuvant antiestrogen therapy with Arimidex 1 mg daily. She is tolerating this well. She has no evidence of recurrent disease.   PLAN:  #1 patient is tolerating Arimidex well without any significant complaints.  #2 patient will return in 6 months time in followup.  All questions were answered. The patient knows to call the clinic with any problems, questions or concerns. We can certainly see the patient much sooner if necessary.  I spent 25 minutes counseling the patient face to face. The total time spent in the appointment was 30 minutes.    Drue Second, MD Medical/Oncology [facility] [phone] (beeper) [phone] (Office)  12/22/2011, 11:01 AM

## 2011-12-22 NOTE — Telephone Encounter (Signed)
Gave patient appointment for 03-22-2012 starting at 9:00am  Sent patient back to the lab on 12-22-2011

## 2011-12-25 ENCOUNTER — Telehealth: Payer: Self-pay | Admitting: *Deleted

## 2011-12-25 NOTE — Telephone Encounter (Signed)
Per MD, notified pt to take oral iron OTC daily. Pt verbalized understanding

## 2011-12-25 NOTE — Telephone Encounter (Signed)
Message copied by Cooper Render on Mon Dec 25, 2011 11:30 AM ------      Message from: Marilyn [name]      Created: Mon Dec 25, 2011 11:13 AM       Call patient: take Oral iron over the counter daily

## 2012-01-10 ENCOUNTER — Ambulatory Visit: Payer: PRIVATE HEALTH INSURANCE | Attending: Oncology | Admitting: Physical Therapy

## 2012-01-10 DIAGNOSIS — M24519 Contracture, unspecified shoulder: Secondary | ICD-10-CM | POA: Insufficient documentation

## 2012-01-10 DIAGNOSIS — M25519 Pain in unspecified shoulder: Secondary | ICD-10-CM | POA: Insufficient documentation

## 2012-01-10 DIAGNOSIS — IMO0001 Reserved for inherently not codable concepts without codable children: Secondary | ICD-10-CM | POA: Insufficient documentation

## 2012-01-10 DIAGNOSIS — I890 Lymphedema, not elsewhere classified: Secondary | ICD-10-CM | POA: Insufficient documentation

## 2012-01-17 ENCOUNTER — Ambulatory Visit: Payer: PRIVATE HEALTH INSURANCE

## 2012-01-29 ENCOUNTER — Ambulatory Visit: Payer: PRIVATE HEALTH INSURANCE | Attending: Oncology | Admitting: Physical Therapy

## 2012-01-29 DIAGNOSIS — I890 Lymphedema, not elsewhere classified: Secondary | ICD-10-CM | POA: Insufficient documentation

## 2012-01-29 DIAGNOSIS — M25519 Pain in unspecified shoulder: Secondary | ICD-10-CM | POA: Insufficient documentation

## 2012-01-29 DIAGNOSIS — M24519 Contracture, unspecified shoulder: Secondary | ICD-10-CM | POA: Insufficient documentation

## 2012-01-29 DIAGNOSIS — IMO0001 Reserved for inherently not codable concepts without codable children: Secondary | ICD-10-CM | POA: Insufficient documentation

## 2012-01-31 ENCOUNTER — Ambulatory Visit: Payer: PRIVATE HEALTH INSURANCE | Admitting: Physical Therapy

## 2012-01-31 ENCOUNTER — Encounter: Payer: Self-pay | Admitting: Medical Oncology

## 2012-01-31 NOTE — Progress Notes (Signed)
Faxed "Cancer Rehab Referral" for compression sleeve and glove to [facility] @ [facility] [phone], attn to Dwaine Gale, PT.

## 2012-02-17 ENCOUNTER — Encounter: Payer: Self-pay | Admitting: Pulmonary Disease

## 2012-02-27 ENCOUNTER — Encounter (HOSPITAL_COMMUNITY): Payer: Self-pay | Admitting: Emergency Medicine

## 2012-02-27 ENCOUNTER — Emergency Department (INDEPENDENT_AMBULATORY_CARE_PROVIDER_SITE_OTHER)
Admission: EM | Admit: 2012-02-27 | Discharge: 2012-02-27 | Disposition: A | Discharge status: Home / Self Care | Payer: PRIVATE HEALTH INSURANCE | Source: Home / Self Care | Attending: Family Medicine | Admitting: Family Medicine

## 2012-02-27 DIAGNOSIS — J069 Acute upper respiratory infection, unspecified: Secondary | ICD-10-CM

## 2012-02-27 MED ORDER — AZITHROMYCIN 250 MG PO TABS: ORAL_TABLET | ORAL | Status: DC

## 2012-02-27 MED ORDER — LORATADINE 10 MG PO TBDP: 10.0000 mg | ORAL_TABLET | Freq: Every day | ORAL | Status: DC

## 2012-02-27 MED ORDER — SALINE NASAL SPRAY 0.65 % NA SOLN: 1.0000 | NASAL | Status: DC | PRN

## 2012-02-27 NOTE — ED Notes (Signed)
Waiting discharge papers 

## 2012-02-27 NOTE — ED Provider Notes (Signed)
History     CSN: [id number]  Arrival date & time 02/27/12  1011   None     Chief Complaint  Patient presents with  . URI    dry nonproductive cough. generalized body aches. sneezing.diarrhea x 2 days ago.    (Consider location/radiation/quality/duration/timing/severity/associated sxs/prior treatment) Patient is a 56 y.o. female presenting with URI. The history is provided by the patient.  URI The primary symptoms include fatigue, sore throat and cough. This is a new problem. The problem has not changed since onset. The onset of the illness is associated with exposure to sick contacts. Symptoms associated with the illness include chills, congestion and rhinorrhea. The following treatments were addressed: A decongestant was ineffective (dayquil and nyquil). Risk factors for severe complications from URI include immunosuppression (breast cancer).  Reports similar symptoms one week ago that improved, but has returned in the last four days.  Past Medical History  Diagnosis Date  . History of bronchitis   . History of sarcoidosis   . Cigarette smoker   . Hyperlipidemia   . Diverticulosis of colon   . Colon polyp   . DJD (degenerative joint disease)   . Vitamin D deficiency   . Panic disorder   . Depression   . History of anemia   . Complication of anesthesia     DIFFICULTY AWAKENING  . Headache   . Incisional breast wound     AT AGE 53 BILATERAL INCISION TO BREAST MADE  . Cancer     BREAST - left  . 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 Anxiety     panic disorder  . IBD (inflammatory bowel disease)   . GERD (gastroesophageal reflux disease)   . DJD (degenerative joint disease)   . Allergy   . Anemia     hx  . Leg swelling   . S/P radiation therapy 08/17/11 - 09/28/11    LLQ - 50 Gy/25 Fractions with Boost of 10 Gy / 5 fractions    Past Surgical History  Procedure Date  . Total abdominal hysterectomy 2000   Dr Jennette Kettle  . Trigger finger release 2008    Dr Mina Marble  . Bunionectomy 2011    bilateral by Dr Celene Skeen  . Tonsillectomy 1972  . Cyst removed from right hand 1995  . Colonoscopy     polyp  . Breast surgery 07/05/11    left breast lumpectomy with needle loc & axillary sln bx  . Biopsy breast     left breast  as a teenager  benign    Family History  Problem Relation Age of Onset  . Anemia Mother   . Other Mother     + H Pylori  . Prostate cancer Father   . Colon cancer Father     diagnosed 2009  . Cancer Father     colon and prostate cancer  . Breast cancer Sister     #1  . Cancer Sister 30    breast cancer  . Sarcoidosis Sister     #2  . Multiple sclerosis Sister     #2  . Hypertension Brother     #1  . Cancer Brother     prostate cancer  . Prostate cancer Brother     #1  . Hyperlipidemia Brother     #1  . Cancer Cousin     breast cancer    History  Substance Use Topics  . Smoking  status: Current Every Day Smoker -- 0.5 packs/day    Types: Cigarettes    Last Attempt to Quit: 12/25/2010  . Smokeless tobacco: Former Neurosurgeon    Quit date: 09/15/2011  . Alcohol Use: 0.0 oz/week    0-1 drink(s) per week     Comment: social    OB History    Grav Para Term Preterm Abortions TAB SAB Ect Mult Living                 Obstetric Comments   Menarche age 64, nulliparity, HRT x 1 year, Hysterectomy      Review of Systems  Constitutional: Positive for chills and fatigue.  HENT: Positive for congestion, sore throat and rhinorrhea.   Respiratory: Positive for cough.   Cardiovascular: Positive for chest pain.    Allergies  Penicillins  Home Medications   Current Outpatient Rx  Name  Route  Sig  Dispense  Refill  . ANASTROZOLE 1 MG PO TABS   Oral   Take 1 mg by mouth daily.          . BUPROPION HCL ER (XL) 150 MG PO TB24   Oral   Take 1 tablet (150 mg total) by mouth daily.   30 tablet   6   . CALCIUM PO   Oral   Take 1 tablet by mouth 2 (two) times  daily.         Marland Kitchen VITAMIN D 1000 UNITS PO TABS   Oral   Take 1,000 Units by mouth daily.         Marland Kitchen ALPRAZOLAM 0.5 MG PO TABS   Oral   Take 1 tablet (0.5 mg total) by mouth daily as needed. For anxiety   90 tablet   5   . AZITHROMYCIN 250 MG PO TABS      Azithromycin 500mg  on day 1, then 250mg  on days 2-4   6 tablet   0   . EMOLLIENT BASE EX CREA   Topical   Apply topically 2 (two) times daily.         Marland Kitchen RADIAPLEXRX EX GEL   Topical   Apply topically once. Apply to skin 2 -3 times daily but only after treatment is completed.         Marland Kitchen LORATADINE 10 MG PO TBDP   Oral   Take 1 tablet (10 mg total) by mouth daily.   30 tablet   2   . ALRA NON-METALLIC DEODORANT (RAD-ONC)   Topical   Apply 1 application topically daily as needed.         Marland Kitchen SALINE NASAL SPRAY 0.65 % NA SOLN   Nasal   Place 1 spray into the nose as needed for congestion.   30 mL   12     BP 121/63  Pulse 68  Temp 97.9 F (36.6 C) (Oral)  Resp 16  SpO2 100%  Physical Exam  Nursing note and vitals reviewed. Constitutional: She is oriented to person, place, and time. Vital signs are normal. She appears well-developed and well-nourished. She is active and cooperative.  HENT:  Head: Normocephalic.  Right Ear: Tympanic membrane and external ear normal.  Left Ear: Tympanic membrane and external ear normal.  Nose: Rhinorrhea present.  Mouth/Throat: Uvula is midline, oropharynx is clear and moist and mucous membranes are normal.  Eyes: Conjunctivae normal are normal. Pupils are equal, round, and reactive to light. No scleral icterus.  Neck: Trachea normal. Neck supple.  Cardiovascular: Normal rate, regular rhythm,  normal heart sounds, intact distal pulses and normal pulses.   Pulmonary/Chest: Effort normal and breath sounds normal.  Lymphadenopathy:       Head (right side): No submental, no submandibular, no tonsillar, no preauricular, no posterior auricular and no occipital adenopathy  present.       Head (left side): No submental, no submandibular, no tonsillar, no preauricular, no posterior auricular and no occipital adenopathy present.    She has no cervical adenopathy.  Neurological: She is alert and oriented to person, place, and time. No cranial nerve deficit or sensory deficit.  Skin: Skin is warm and dry. No rash noted. She is not diaphoretic.  Psychiatric: She has a normal mood and affect. Her speech is normal and behavior is normal. Judgment and thought content normal. Cognition and memory are normal.    ED Course  Procedures (including critical care time)  Labs Reviewed - No data to display No results found.   1. URI (upper respiratory infection)       MDM  Increase fluid intake, rest.  Begin Azithromycin.  Begin expectorant, saline nasal spray and/or saline irrigation, and cough suppressant at bedtime. Antihistamines of your choice (Claritin or Zyrtec).  Tylenol or Motrin for fever/discomfort.  Followup with PCP if not improving 5 to 10 days.         Johnsie Kindred, NP 02/27/12 1120

## 2012-02-27 NOTE — ED Notes (Signed)
Pt c/o dry nonproductive cough. Generalized body aches. Diarrhea x 2 days ago. Sneezing. And pt states that she has felt feverish at times. Pt has tried otc medication with no relief in symptoms. Symptoms have been present since 02/23/12.

## 2012-02-27 NOTE — ED Provider Notes (Signed)
Medical screening examination/treatment/procedure(s) were performed by resident physician or non-physician practitioner and as supervising physician I was immediately available for consultation/collaboration.   Zurich Carreno DOUGLAS MD.    Elgene Coral D Atul Delucia, MD 02/27/12 1133 

## 2012-03-19 ENCOUNTER — Telehealth: Payer: Self-pay | Admitting: Medical Oncology

## 2012-03-19 NOTE — Telephone Encounter (Signed)
Received call from [place] at [facility] regarding patient and needing ok from MD that patient is well enough to attend ABC education class. Per MD, faxed letter to [place] at [phone] that patient is well enough to attend class.

## 2012-03-22 ENCOUNTER — Ambulatory Visit (HOSPITAL_BASED_OUTPATIENT_CLINIC_OR_DEPARTMENT_OTHER): Payer: PRIVATE HEALTH INSURANCE | Admitting: Physician Assistant

## 2012-03-22 ENCOUNTER — Encounter: Payer: Self-pay | Admitting: *Deleted

## 2012-03-22 ENCOUNTER — Other Ambulatory Visit: Payer: PRIVATE HEALTH INSURANCE | Admitting: Lab

## 2012-03-22 ENCOUNTER — Telehealth: Payer: Self-pay | Admitting: Oncology

## 2012-03-22 ENCOUNTER — Other Ambulatory Visit: Payer: Self-pay | Admitting: Oncology

## 2012-03-22 ENCOUNTER — Encounter: Payer: Self-pay | Admitting: Physician Assistant

## 2012-03-22 VITALS — BP 126/74 | HR 88 | Temp 97.9°F | Resp 20 | Ht 65.0 in | Wt 224.2 lb

## 2012-03-22 DIAGNOSIS — Z853 Personal history of malignant neoplasm of breast: Secondary | ICD-10-CM

## 2012-03-22 DIAGNOSIS — Z170 Estrogen receptor positive status [ER+]: Secondary | ICD-10-CM

## 2012-03-22 DIAGNOSIS — C50419 Malignant neoplasm of upper-outer quadrant of unspecified female breast: Secondary | ICD-10-CM

## 2012-03-22 DIAGNOSIS — C50519 Malignant neoplasm of lower-outer quadrant of unspecified female breast: Secondary | ICD-10-CM

## 2012-03-22 LAB — CBC WITH DIFFERENTIAL/PLATELET
Basophils Absolute: 0 10*3/uL (ref 0.0–0.1)
EOS%: 2.1 % (ref 0.0–7.0)
Eosinophils Absolute: 0.2 10*3/uL (ref 0.0–0.5)
HGB: 11.9 g/dL (ref 11.6–15.9)
MONO#: 0.7 10*3/uL (ref 0.1–0.9)
NEUT#: 5.5 10*3/uL (ref 1.5–6.5)
RDW: 14.3 % (ref 11.2–14.5)
WBC: 7.6 10*3/uL (ref 3.9–10.3)
lymph#: 1.3 10*3/uL (ref 0.9–3.3)

## 2012-03-22 LAB — COMPREHENSIVE METABOLIC PANEL (CC13)
Albumin: 3.5 g/dL (ref 3.5–5.0)
Alkaline Phosphatase: 111 U/L (ref 40–150)
CO2: 22 mEq/L (ref 22–29)
Glucose: 99 mg/dl (ref 70–99)
Potassium: 4.2 mEq/L (ref 3.5–5.1)
Sodium: 140 mEq/L (ref 136–145)
Total Protein: 7.4 g/dL (ref 6.4–8.3)

## 2012-03-22 NOTE — Telephone Encounter (Signed)
gv and printed appt schedule for pt for July...the patient aware °

## 2012-03-22 NOTE — Progress Notes (Signed)
03/22/12 at 10:15am - [facility] [id number]/ 6 months post RT visit.  The pt was into the cancer center this morning for her 6 months post RT visit.  She states she is doing well, and she denies any skin concerns from her radiation.  The pt's breast photos were taken and the photos will be uploaded to [facility] data management on Monday, 03/25/12.  The pt completed her study required questionnaires today in the clinic.  The pt is continuing to take her Arimidex daily.  She has not had a recurrence of her breast cancer.  She also denies using any new skin products on her treated breast.  The pt was seen and examined today by Marlowe Kays, PA and Dr. Welton Flakes.  The pt's RTOG SOMA Criteria for Radiation-Induced Breast and Chest Wall Chronic Skin Reaction form was completed by Marlowe Kays, PA.  The research nurse recorded the answers on the form, but the physician assistant completed the skin assessment and provided the results.  The pt is aware that she will need to be seen for her 12 months post radiation appointment in August 2014.

## 2012-03-22 NOTE — Progress Notes (Signed)
[facility]  Telephone:(336) [phone] Fax:(336) [phone]   OFFICE PROGRESS NOTE   Cc:  Michele Mcalpine, MD  [facility] [address]  [place] [address]  Dr. Glenna Fellows  Dr. Varney Baas  Dr. Lonie Peak   DIAGNOSIS: 57 year old female who originally was seen in the multidisciplinary breast clinic for a stage I invasive ductal carcinoma of the left breast found on a screening mammogram.   PRIOR THERAPY:   #1 patient originally seen in the multidisciplinary breast clinic after needle core biopsy that showed a left invasive ductal carcinoma, grade 1 ER positive PR positive HER-2/neu negative with Ki-67 of 6%.   #2 patient has now gone on to have a lumpectomy of the left breast with sentinel node biopsy performed on 06/20/2011.   #3 the final pathology revealed a 1.0 cm invasive ductal carcinoma grade 1 ER positive PR positive HER-2/neu negative with Ki-67 of 6%.   #4 patient is currently receiving radiation therapy from 08/17/2011 , completed on 09/28/11  #5 she will begun adjuvant antiestrogen therapy consisting of Arimidex 1 mg daily Beginning October 02, 2011  CURRENT THERAPY: Arimidex 1 mg daily since October 02, 2011.    INTERVAL HISTORY:   Marilyn [name] 57 y.o. female returns for followup visit today.Overall patient is doing well she is tolerating Arimidex quite well. . She otherwise denies any fevers chills night sweats headaches shortness of breath chest pains palpitations no myalgias or arthralgias. Breast tenderness is less evident.  Since last visit, the patient has been seen at the lymphedema clinic for rehab, and L compression glove and arm sleeve were recommended, with prescription needed. She has been seen by her Gyn,in mid January 2014, with a negative Pap exam. She is currently on a study at [facility] for evaluation of post radiation skin changes and the tendency to develop skin  fibrosis, telagenctasia, hypopigmentation, or  edema based on chemical  Abnormalities in labs including UA and CMET. It is called "Impact in Genomics and Exposures on Disparities in Breast Cancer Radiosensitivity" patient case number [id number].She denies any skin modifications, except for occasional hypersensitivity and pruritis. Denies severe hot flashes, or vaginal bleeding. No memory abnormalities.   Remainder of the 10 point review of systems is negative. She is due for a  R mammogram  and L [place] 06/06/2011   Past Medical History  Diagnosis Date  . History of bronchitis   . History of sarcoidosis   . Cigarette smoker   . Hyperlipidemia   . Diverticulosis of colon   . Colon polyp   . DJD (degenerative joint disease)   . Vitamin D deficiency   . Panic disorder   . Depression   . History of anemia   . Complication of anesthesia     DIFFICULTY AWAKENING  . Headache   . Incisional breast wound     AT AGE 46 BILATERAL INCISION TO BREAST MADE  . Cancer     BREAST - left  . 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 Anxiety     panic disorder  . IBD (inflammatory bowel disease)   . GERD (gastroesophageal reflux disease)   . DJD (degenerative joint disease)   . Allergy   . Anemia     hx  . Leg swelling   . S/P radiation therapy 08/17/11 - 09/28/11    LLQ - 50 Gy/25 Fractions with Boost of  10 Gy / 5 fractions    Past Surgical History  Procedure Date  . Total abdominal hysterectomy 2000    Dr Jennette Kettle  . Trigger finger release 2008    Dr Mina Marble  . Bunionectomy 2011    bilateral by Dr Celene Skeen  . Tonsillectomy 1972  . Cyst removed from right hand 1995  . Colonoscopy     polyp  . Breast surgery 07/05/11    left breast lumpectomy with needle loc & axillary sln bx  . Biopsy breast     left breast  as a teenager  benign    Current Outpatient Prescriptions  Medication Sig Dispense Refill  . ALPRAZolam (XANAX) 0.5 MG tablet Take 1 tablet (0.5 mg total) by mouth daily as needed. For  anxiety  90 tablet  5  . anastrozole (ARIMIDEX) 1 MG tablet Take 1 mg by mouth daily.       Marland Kitchen azithromycin (ZITHROMAX Z-PAK) 250 MG tablet Azithromycin 500mg  on day 1, then 250mg  on days 2-4  6 tablet  0  . buPROPion (WELLBUTRIN XL) 150 MG 24 hr tablet Take 1 tablet (150 mg total) by mouth daily.  30 tablet  6  . CALCIUM PO Take 1 tablet by mouth 2 (two) times daily.      . cholecalciferol (VITAMIN D) 1000 UNITS tablet Take 1,000 Units by mouth daily.      Marland Kitchen emollient (BIAFINE) cream Apply topically 2 (two) times daily.      . hyaluronate sodium (RADIAPLEXRX) GEL Apply topically once. Apply to skin 2 -3 times daily but only after treatment is completed.      . loratadine (CLARITIN REDITABS) 10 MG dissolvable tablet Take 1 tablet (10 mg total) by mouth daily.  30 tablet  2  . non-metallic deodorant (ALRA) MISC Apply 1 application topically daily as needed.      . sodium chloride (OCEAN NASAL SPRAY) 0.65 % nasal spray Place 1 spray into the nose as needed for congestion.  30 mL  12    ALLERGIES:  is allergic to penicillins.  REVIEW OF SYSTEMS:  No respiratory or cardiac complaints.  The rest of the 14-point review of system was negative.   Filed Vitals:   03/22/12 0909  BP: 126/74  Pulse: 88  Temp: 97.9 F (36.6 C)  Resp: 20   Wt Readings from Last 3 Encounters:  03/22/12 224 lb 3.2 oz (101.696 kg)  12/22/11 229 lb 3.2 oz (103.964 kg)  12/18/11 229 lb (103.874 kg)     PHYSICAL EXAMINATION:  BP 126/74  Pulse 88  Temp 97.9 F (36.6 C) (Oral)  Resp 20  Ht 5\' 5"  (1.651 m)  Wt 224 lb 3.2 oz (101.696 kg)  BMI 37.31 kg/m2  GENERAL: Well developed, well nourished, in no acute distress.  EENT: No ocular or oral lesions. No stomatitis.  Resp: clear to auscultation bilaterally and normal percussion bilaterally  Cardio: regular rate and rhythm, S1, S2 normal, no murmur, click, rub or gallop  GI: soft, non-tender; bowel sounds normal; no masses, no organomegaly  Extremities:  extremities normal, atraumatic, no cyanosis or edema  Neurologic: Grossly normal  Left breast examination reveals a well incision scar without any other masses or nipple discharge right breast no masses or nipple discharge.   ECOG PERFORMANCE STATUS: 0 - Asymptomatic    LABORATORY/RADIOLOGY DATA:  Lab Results  Component Value Date   WBC 7.6 03/22/2012   HGB 11.9 03/22/2012   HCT 37.1 03/22/2012   PLT 208 03/22/2012  GLUCOSE 93 12/18/2011   CHOL 214* 12/18/2011   TRIG 114.0 12/18/2011   HDL 59.60 12/18/2011   LDLDIRECT 118.9 12/18/2011   LDLCALC 115* 10/09/2007   ALKPHOS 90 12/18/2011   ALT 18 12/18/2011   AST 18 12/18/2011   NA 139 12/18/2011   K 4.4 12/18/2011   CL 106 12/18/2011   CREATININE 0.6 12/18/2011   BUN 11 12/18/2011   CO2 27 12/18/2011        ASSESSMENT AND PLAN:   1. Marilyn [name] 56 y.o. female with   #1 stage I invasive ductal carcinoma of the left breast status post lumpectomy the final pathology revealed a 1.0 cm ER/PR positive HER-2/neu negative grade 1 disease. Sentinel node was negative for metastatic disease.Patient completed her radiation therapy on 09/28/2011. She was then begun on adjuvant antiestrogen therapy with Arimidex 1 mg daily. She is tolerating this well. She has no evidence of recurrent disease.   PLAN:   #1 patient is tolerating Arimidex well without any significant complaints.   #2 Dr. Welton Flakes has written a prescription for L glove and compression sleeve.  #3 Patient to have a MGM at [facility] in 05/2012  #4 She is in a study at [facility] " Impact in Genomics and Exposures on Disparities in Breast Cancer Radiosensitivity"   #2 patient will return 1 year for follow up  All questions were answered. The patient knows to call the clinic with any problems, questions or concerns. We can certainly see the patient much sooner if necessary.

## 2012-03-22 NOTE — Patient Instructions (Signed)
Return in 6 months. Prescription for lymphedema glove and sleeve for L arm has been given by Dr. Welton Flakes.  [facility] to call you to arrange diagnostic mammograms.

## 2012-03-25 ENCOUNTER — Ambulatory Visit (INDEPENDENT_AMBULATORY_CARE_PROVIDER_SITE_OTHER): Payer: PRIVATE HEALTH INSURANCE | Admitting: Pulmonary Disease

## 2012-03-25 ENCOUNTER — Encounter: Payer: Self-pay | Admitting: Pulmonary Disease

## 2012-03-25 VITALS — BP 130/86 | HR 86 | Temp 97.9°F | Ht 65.0 in | Wt 226.2 lb

## 2012-03-25 DIAGNOSIS — C50919 Malignant neoplasm of unspecified site of unspecified female breast: Secondary | ICD-10-CM

## 2012-03-25 DIAGNOSIS — J40 Bronchitis, not specified as acute or chronic: Secondary | ICD-10-CM

## 2012-03-25 DIAGNOSIS — F419 Anxiety disorder, unspecified: Secondary | ICD-10-CM

## 2012-03-25 DIAGNOSIS — F411 Generalized anxiety disorder: Secondary | ICD-10-CM

## 2012-03-25 DIAGNOSIS — K219 Gastro-esophageal reflux disease without esophagitis: Secondary | ICD-10-CM

## 2012-03-25 DIAGNOSIS — F17200 Nicotine dependence, unspecified, uncomplicated: Secondary | ICD-10-CM

## 2012-03-25 DIAGNOSIS — D869 Sarcoidosis, unspecified: Secondary | ICD-10-CM

## 2012-03-25 MED ORDER — METHYLPREDNISOLONE ACETATE 80 MG/ML IJ SUSP
80.0000 mg | Freq: Once | INTRAMUSCULAR | Status: AC
  Administered 2012-03-25: 80 mg via INTRAMUSCULAR

## 2012-03-25 MED ORDER — ALPRAZOLAM 0.5 MG PO TABS: ORAL_TABLET | ORAL | Status: DC

## 2012-03-25 MED ORDER — PREDNISONE 20 MG PO TABS: ORAL_TABLET | ORAL | Status: DC

## 2012-03-25 NOTE — Patient Instructions (Addendum)
Today we updated your med list in our EPIC system...    Continue your current medications the same...  For your Asthmatic Bronchitis>    We gave you a Depo shot today...    In the AM- start PREDNISONE 20mg  tabs:       Take 1 tab twice daily for 4days, then       One tab daily for 4d, then       1/2 tab daily for 4d, then       1/2 tab every other day til gone...  You may use the Mucinex, DELSYM cough syrup, etc as needed.Marland KitchenMarland Kitchen

## 2012-03-25 NOTE — Progress Notes (Signed)
Subjective:    Patient ID: Marilyn [name], female    DOB: [birth year], 58 y.o.   MRN: [id number]  HPI 57 y/o BF here for a follow up visit... she has multiple medical problems as noted below...   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     Today she is concerned about a 20# weight gain up to 229# & she will see the Oncology nutritionists soon; she has noted some swelling in her ankles assoc w/ venous insuffic & we discussed no salt, elevation, & support hose for Rx;  Finally she also removed a tick from her abd the other day> we discussed Rx w/ Doxycycline empirically.    We reviewed prob list, meds, xrays and labs> see below>>  ~  December 18, 2011:  68mo ROV & Marilyn [name] is essent stable- notes tired, no energy, incr weight, some DOE, she is s/p 30 XRT treatments from [name], now on Arimidex 1mg  per [name]...  We reviewed the following medical problems during today's OV>>     Bronchitis, Sarcoid, Smoker> still smoking 1/2-1ppd; notes mild cough, sm amt beige phlegm, no CP, +Sob/Doe    VI> trace  edema; she knows to elim sodium, elev legs, wear support hose, not on diuretics as yet...    Chol> on diet alone; FLP shows TChol 214, TG 114, HDL 60, LDL 119    Overweight> she increased from 160#=> 229# due to Zoloft rx she says, then exac by foot surg & immobility...    GI- GERD, Divertics, IBS, Polyp, +FamHx> she continues to do well 7 denies abd pain, n/v, d/c, blood seen, etc...    Breast Cancer> on Arimidex per [name]...     DJD, VitD> off Tramadol, on OTC meds as needed; she notes some left hand paresthesias- rec wrist splint...    Headaches> no recent problems, she uses OTC meds as needed...    Anxiety, Panic> on Alpraz0.5mg  prn...    Anemia> CBC showed Hg 11.8=> 12.2 We reviewed prob list, meds, xrays and labs> see below for updates >>  CXR 10/13 showed normal heart size, atherosclerotic calcif, clear lungs, surg clips within the left breast... LABS 10/13:  FLP- on diet alone w/ TChol=214 LDL 119;  Chems- wnl;  CBC- ok w/ Hg=12.2 MCV=75 & rec to start on Fe;  TSH=1.65;  VitD=26  ~  March 25, 2012:  15mo ROV & add-on for cough> several wk hx cough, thick sput, w/o hemoptysis; congested, sl wheezy, feeling bad- went to ER & given ZPak, antihist, saline,  cough syrup, only min improved; still smoking 1/2-1ppd; Last CXR 10/13 was clear, NAD; Exam showed scat rhonchi & cough; we decided to treat w/ Depo & Pred taper...    Bronchitis, Sarcoid, Smoker> not on regular inhalers, she declines meds to aid smoking cessation...     Breast cancer> she had f/u Oncology 1/14- s/p left lumpectomy & sentinel node bx 4/13, s/p XRT from 6/13-8/13, now on Arimidex since 8/13 (tol well); she developed some lymphedema- seen in clinic & placed on compression sleeve; she is participating in a [facility] study re: skin changes after XRT...    We reviewed prob list, meds, xrays and labs> see below for updates >> she had the 2013 Flu vaccine 10/13...           Problem List:  PHYSICAL EXAMINATION (ICD-V70.0) - GYN=  DrNeal who does her PAP smears, Mammograms, and BMD... he also checked her VitD level and started VitD 50K weekly, then changed her to 2000 u daily... she is s/p hysterectomy in 2000... Note- she had an abd bruit investigated w/ an Abd MRA 6/08 and nothing found...  had TETANUS shot 2010 at [facility] after she stepped on nail... Routine Mammogram 3/13 showed left breast lesion leading to surg by DrHoxworth, XRT by [name], Oncology f/u w/ [name]...   Hx of BRONCHITIS (ICD-490) - no recent episodes... Min cough & clear sputum... ~  Baseline CXR is clear and WNL (no residuals from her sarcoidosis) ~  Spirometry 7/06 showed FVC= 3.77 (130%), FEV1= 2.71 (113%), %1sec=72, mid-flows=66%... ~  CXR 11/12 showed normal heart size, min scarring, otherw clear, NAD.Marland Kitchen.  ~  CXR 10/13 showed normal heart size, atherosclerotic calcif, clear lungs, surg clips within the left breast... ~  1/14: she presented w/ refractory cough/ AB- given Depo, Pred taper, & the usual OTC meds...  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 totally resolved on Pred therapy w/ normalization of CXR and no recurrence off Pred now since 1992... ~  CXR 9/10 remains clear & WNL.Marland Kitchen. ~  CXR 11/11 remains clear & WNL.Marland Kitchen. ~  CXR 11/12 remains clear, mild basilar atx, otherw WNL.Marland Kitchen. ~  CXR 10/13 showed normal heart size, atherosclerotic calcif, clear lungs, surg clips within the left breast...  CIGARETTE SMOKER (ICD-305.1) - she was smoking 1/2 ppd> states she quit 10/12 & now back to 1/2-1ppd!!!  VENOUS INSUFFICIENCY (ICD-459.81) - she follows a low salt diet, elevates when nec, and wears support hose Prn.  HYPERLIPIDEMIA (ICD-272.4) - hx of hyperlipidemia w/ high HDL's prev... ~  FLP 7/07 showed TChol 210, TG 56, HDL 81, LDL 101 ~  FLP 6/08 showed TChol 232, TG 88, HDL 83, LDL 112 ~  FLP 8/09 showed TChol 199, TG 85, HDL 68, LDL 115 ~  FLP 9/10 (wt=175#) showed  TChol 231, TG 78, HDL 94, LDL 120 ~  FLP 11/11 (wt=197#) showed TChol 265, TG 93, HDL 89, LDL 143... needs better diet get wt down. ~  FLP 11/12 (wt=210#) showed TChol 238, TG 79, HDL 83, LDL 148... Offered meds but she wants to see how she does on her wt loss program. ~  FLP 3/13 (wt=229#) showed TChol 235, TG 89, HDL 85, LDL 130 ~  FLP 10/13 (wt=229#) showed TChol 214, TG 114, HDL 60, LDL 119   GERD (ICD-530.81) - uses OTC Prilosec as needed.  DIVERTICULOSIS OF COLON (ICD-562.10) IRRITABLE BOWEL SYNDROME (ICD-564.1) COLONIC POLYPS (ICD-211.3) Family Hx  of COLON CANCER (ICD-153.9) -  ~  colonoscopy 9/06 by Dorris Singh was WNL- no divertics, no polyps... f/u planned 70yrs. ~  Father was diagnosed w/ colon cancer in 07/09/07 & being treated by oncology... pt will need colon f/u in 07/08/2009. ~  f/u colonoscopy 9/11 by [name] showed divertics & 2 polyps= polypoid mucosa, f/u planned 35yrs.  Hx of UTI (ICD-599.0)  BREAST CANCER >> Routine mammogram 3/13 showed a lesion on the left breast >> See above:  Surg- [name],  XRT- [name],  Oncology- [name]... 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  DEGENERATIVE JOINT DISEASE (ICD-715.90) - she has mild DJD esp in knees & prev had cortisone shots from ortho- DrMcKinley... not on regular meds, she uses Tylenol Prn...  VITAMIN D DEFICIENCY (ICD-268.9) -  followed by DrNeal & currently taking Vit D 1000 u caps- 2 daily. ~  Labs here 11/12 showed Vit D level = 37  HEADACHE (ICD-784.0)  Hx Panic Disorder> Improved w/o attacks in several yrs; she has ALPRAZOLAM0.5mg  for prn use but seldom needs it she says... Hx of DEPRESSION (ICD-311) - husb passed away in 07-09-2003... she sees DrMcKinney Financial risk analyst) on ZOLOFT 100mg /d...  Hx of ANEMIA (ICD-285.9) - Hg chronically in the 11-12 range... ~  labs 9/10 showed Hg= 11.9, MCV= 77, Fe= 76 (21%sat) ~  labs 11/11 showed Hg= 12.0, MCV= 76, Fe= 80 (20%sat) ~  Labs 11/12 showed Hg= 12.1, MCV= 75, Fe=68 (19%sat) ~  Labs 4/13 showed Hg= 11.2 - 12.6 ~  Labs 10/13 showed Hg= 12.2, MCV= 75, Rec to start Fe daily...   Past Surgical History  Procedure Date  . Total abdominal hysterectomy 07-09-98    Dr Jennette Kettle  . Trigger finger release Jul 09, 2006    Dr Mina Marble  . Bunionectomy 07-08-2009    bilateral by Dr Celene Skeen  . Tonsillectomy 1972  . Cyst removed from right hand 1995  . Colonoscopy     polyp  . Breast surgery 07/05/11    left breast lumpectomy with needle loc & axillary sln bx  . Biopsy breast     left breast  as a teenager  benign    Outpatient Encounter Prescriptions as of 03/25/2012  Medication Sig Dispense Refill  . ALPRAZolam (XANAX) 0.5 MG tablet Take 1 tablet (0.5 mg total) by mouth daily as needed. For anxiety  90 tablet  5  . anastrozole (ARIMIDEX) 1 MG tablet Take 1 mg by mouth daily.       Marland Kitchen buPROPion (WELLBUTRIN XL) 150 MG 24 hr tablet Take 1 tablet (150 mg total) by mouth daily.  30 tablet  6  . CALCIUM PO Take 1 tablet by mouth 2 (two) times daily.      . cholecalciferol (VITAMIN D) 1000 UNITS tablet Take 1,000 Units by mouth daily.      Marland Kitchen loratadine (CLARITIN REDITABS) 10 MG dissolvable tablet Take 1 tablet (10 mg  total) by mouth daily.  30 tablet  2  . non-metallic deodorant (ALRA) MISC Apply 1 application topically daily as needed.      . sodium chloride (OCEAN NASAL SPRAY) 0.65 % nasal spray  Place 1 spray into the nose as needed for congestion.  30 mL  12  . [DISCONTINUED] azithromycin (ZITHROMAX Z-PAK) 250 MG tablet Azithromycin 500mg  on day 1, then 250mg  on days 2-4  6 tablet  0  . [DISCONTINUED] emollient (BIAFINE) cream Apply topically 2 (two) times daily.      . [DISCONTINUED] hyaluronate sodium (RADIAPLEXRX) GEL Apply topically once. Apply to skin 2 -3 times daily but only after treatment is completed.        Allergies  Allergen Reactions  . Penicillins Rash    At injection site.    Current Medications, Allergies, Past Medical History, Past Surgical History, Family History, and Social History were reviewed in [employer] record.    Review of Systems    The patient notes some DOE- improved from last yr.  She denies fever, chills, sweats, anorexia, fatigue, weakness, malaise, weight loss, sleep disorder, blurring, diplopia, eye irritation, eye discharge, vision loss, eye pain, photophobia, earache, ear discharge, tinnitus, decreased hearing, nasal congestion, nosebleeds, sore throat, hoarseness, chest pain, palpitations, syncope, orthopnea, PND, peripheral edema, cough, dyspnea at rest, excessive sputum, hemoptysis, wheezing, pleurisy, nausea, vomiting, diarrhea, constipation, change in bowel habits, abdominal pain, melena, hematochezia, jaundice, gas/bloating, indigestion/heartburn, dysphagia, odynophagia, dysuria, hematuria, urinary frequency, urinary hesitancy, nocturia, incontinence, back pain, joint pain, joint swelling, muscle cramps, muscle weakness, stiffness, arthritis, sciatica, restless legs, leg pain at night, leg pain with exertion, rash, itching, dryness, suspicious lesions, paralysis, paresthesias, seizures, tremors, vertigo, transient blindness, frequent falls, frequent headaches, difficulty walking, depression, anxiety, memory loss, confusion, cold intolerance, heat intolerance, polydipsia, polyphagia, polyuria, unusual weight change, abnormal  bruising, bleeding, enlarged lymph nodes, urticaria, allergic rash, hay fever, and recurrent infections.     Objective:   Physical Exam     WD, overweight, 57 y/o BF in NAD... GENERAL:  Alert & oriented; pleasant & cooperative... HEENT:  [place]/AT, EOM-wnl, PERRLA, EACs-clear, TMs-wnl, NOSE-clear, THROAT-clear & wnl. NECK:  Supple w/ full ROM; no JVD; normal carotid impulses w/o bruits; no thyromegaly or nodules palpated; no lymphadenopathy. CHEST:  Clear to P & A; few scat rhonchi at bases & end exp wheezing... HEART:  Regular Rhythm; without murmurs/ rubs/ or gallops. ABDOMEN:  Soft & nontender; normal bowel sounds; no organomegaly or masses detected... EXT: without deformities, mild arthritic changes; no varicose veins/ venous insuffic/ or edema. NEURO:  CN's intact; motor testing normal; sensory testing normal; gait normal & balance OK. DERM:  No lesions noted; no rash etc...  RADIOLOGY DATA:  Reviewed in the EPIC EMR & discussed w/ the patient...  LABORATORY DATA:  Reviewed in the EPIC EMR & discussed w/ the patient...   Assessment & Plan:    Ex-smoker, Hx Bronchits, Hx Sarcoid>  Recurrent AB w/ refractory cough, congestion; she's had ZPak from the ER, we will add Depo, Pred taper, Mucinex, Fluids...  Ven Insuffic>  Aware- avoid sodium, elevate, support hose...  Hyperlipid>  Poorly controlled on diet alone yet she doesn't want meds & hopeful that new diet plan will help this as well as her weight...  GI> GERD, Divertics, IBS, Polyps>  Stable on Rx, followed by [name] & up to date...  BREAST CANCER> followed by [name] on Arimidex...  DJD>  She uses OTC analgesics as needed; notes incr symptoms w/  wt gain & hoping for improvement on diet plan...  Hx Depression>  She was followed by psychiatrist prev on Zoloft & on prn Rebeca Allegra which she seldom takes...  Hx Anemia>  Hg stable ~12 w/ sm cells & Fe levels wnl; poss Thalassemia minor, it still wouldn't hurt to take Fe daily  supplement...   Patient's Medications  New Prescriptions   LEVOFLOXACIN (LEVAQUIN) 500 MG TABLET    Take 1 tablet (500 mg total) by mouth daily.   PREDNISONE (DELTASONE) 20 MG TABLET    Take 1 po bid x 4 days, 1 daily x 4 days, 1/2 tab daily x 4 days, 1/2 tab every other day until gone  Previous Medications   ANASTROZOLE (ARIMIDEX) 1 MG TABLET    Take 1 mg by mouth daily.    BUPROPION (WELLBUTRIN XL) 150 MG 24 HR TABLET    Take 1 tablet (150 mg total) by mouth daily.   CALCIUM PO    Take 1 tablet by mouth 2 (two) times daily.   CHOLECALCIFEROL (VITAMIN D) 1000 UNITS TABLET    Take 1,000 Units by mouth daily.   LORATADINE (CLARITIN REDITABS) 10 MG DISSOLVABLE TABLET    Take 1 tablet (10 mg total) by mouth daily.   NON-METALLIC DEODORANT (ALRA) MISC    Apply 1 application topically daily as needed.   SODIUM CHLORIDE (OCEAN NASAL SPRAY) 0.65 % NASAL SPRAY    Place 1 spray into the nose as needed for congestion.  Modified Medications   Modified Medication Previous Medication   ALPRAZOLAM (XANAX) 0.5 MG TABLET ALPRAZolam (XANAX) 0.5 MG tablet      Take 1/2 to 1 tablet by mouth three times daily as needed for anxiety    Take 1 tablet (0.5 mg total) by mouth daily as needed. For anxiety  Discontinued Medications   AZITHROMYCIN (ZITHROMAX Z-PAK) 250 MG TABLET    Azithromycin 500mg  on day 1, then 250mg  on days 2-4   EMOLLIENT (BIAFINE) CREAM    Apply topically 2 (two) times daily.   HYALURONATE SODIUM (RADIAPLEXRX) GEL    Apply topically once. Apply to skin 2 -3 times daily but only after treatment is completed.

## 2012-04-05 ENCOUNTER — Ambulatory Visit: Payer: PRIVATE HEALTH INSURANCE | Admitting: Radiation Oncology

## 2012-04-17 ENCOUNTER — Telehealth: Payer: Self-pay | Admitting: Pulmonary Disease

## 2012-04-17 MED ORDER — LEVOFLOXACIN 500 MG PO TABS: 500.0000 mg | ORAL_TABLET | Freq: Every day | ORAL | Status: DC

## 2012-04-17 NOTE — Telephone Encounter (Signed)
Per SN---call in levaquin 500 mg   #7  1 daily, tylenol prn, delsym otc, mucinex otc  600 mg  2 po bid with plenty of fluids.  thanks

## 2012-04-17 NOTE — Telephone Encounter (Signed)
I spoke with pt. She stated she finished prednisone Saturday. Now her cough has returned. C/o dry cough, fever of 100.3, chest tx, chest congestion x Saturday but worse since yesterday. She has been taking zicam. Pt is requesting further recs. Please advise Dr. Kriste Basque thanks Last OV 03/25/12 Pending OV 06/17/12 Allergies  Allergen Reactions  . Penicillins Rash    At injection site.

## 2012-04-17 NOTE — Telephone Encounter (Signed)
Spoke with the pt and notified of recs per SN She verbalized understanding and states nothing further needed Rx was sent to pharm

## 2012-04-23 ENCOUNTER — Emergency Department (INDEPENDENT_AMBULATORY_CARE_PROVIDER_SITE_OTHER): Payer: PRIVATE HEALTH INSURANCE

## 2012-04-23 ENCOUNTER — Encounter (HOSPITAL_COMMUNITY): Payer: Self-pay | Admitting: *Deleted

## 2012-04-23 ENCOUNTER — Emergency Department (INDEPENDENT_AMBULATORY_CARE_PROVIDER_SITE_OTHER)
Admission: EM | Admit: 2012-04-23 | Discharge: 2012-04-23 | Disposition: A | Discharge status: Home / Self Care | Payer: PRIVATE HEALTH INSURANCE | Source: Home / Self Care | Attending: Family Medicine | Admitting: Family Medicine

## 2012-04-23 DIAGNOSIS — R062 Wheezing: Secondary | ICD-10-CM

## 2012-04-23 MED ORDER — HYDROCODONE-ACETAMINOPHEN 7.5-500 MG/15ML PO SOLN: 10.0000 mL | Freq: Two times a day (BID) | ORAL | Status: DC | PRN

## 2012-04-23 MED ORDER — FLUTICASONE PROPIONATE HFA 220 MCG/ACT IN AERO: 1.0000 | INHALATION_SPRAY | Freq: Two times a day (BID) | RESPIRATORY_TRACT | Status: DC

## 2012-04-23 MED ORDER — ALBUTEROL SULFATE HFA 108 (90 BASE) MCG/ACT IN AERS: 1.0000 | INHALATION_SPRAY | Freq: Four times a day (QID) | RESPIRATORY_TRACT | Status: DC | PRN

## 2012-04-23 MED ORDER — BENZONATATE 100 MG PO CAPS: 100.0000 mg | ORAL_CAPSULE | Freq: Three times a day (TID) | ORAL | Status: DC | PRN

## 2012-04-23 MED ORDER — BENZONATATE 100 MG PO CAPS: 100.0000 mg | ORAL_CAPSULE | Freq: Three times a day (TID) | ORAL | Status: DC

## 2012-04-23 NOTE — ED Notes (Signed)
C/o chest congestion onset 2/16. Cough is prod. of brownish sputum.  Taking a mucuos relief. ( [facility] brand of Mucinex) since Fri. Saw Dr. Kriste Basque on 1/27 and got a Depo Medro shot and prednisone.  Pt. got a Rx. of Levaquin 500 mg. but was afraid to take after she read the side effects from the pharmacy.

## 2012-04-23 NOTE — ED Provider Notes (Signed)
History     CSN: [id number]  Arrival date & time 04/23/12  1552   First MD Initiated Contact with Patient 04/23/12 1633      Chief Complaint  Patient presents with  . URI    (Consider location/radiation/quality/duration/timing/severity/associated sxs/prior treatment) HPI Comments: 57 year old female with history of pulmonary sarcoidosis and breast cancer among other comorbidities. Here complaining of recurrent intermittent cough for over a month. Patient states that she has been treated with azithromycin and steroids in December which cleared her cough then was treated on January 27 with Depo-Medrol and prednisone taper also for cough; A prescription for Levaquin was provided over the phone recently by her pulmonologist but she has not taken this medication as she has been "afraid of possible side effects warnings on the packet insert. The cough persisted and she described it as "whooping" like associated with wheezing and recently became productive of brown sputum in the last 4 days. Denies chest pain, reports subjective fever "few days ago". Has also had nasal congestion with clear rhinorrhea. Appetite is at baseline. Denies shortness of breath or leg swelling. No headache or dizziness. As per patient reports appears that her cough and symptoms improve when patient is on steroids. She was on oral prednisone chronically for years before she was tapered off.    Past Medical History  Diagnosis Date  . History of bronchitis   . History of sarcoidosis   . Cigarette smoker   . Hyperlipidemia   . Diverticulosis of colon   . Colon polyp   . DJD (degenerative joint disease)   . Vitamin D deficiency   . Panic disorder   . Depression   . History of anemia   . Complication of anesthesia     DIFFICULTY AWAKENING  . Headache   . Incisional breast wound     AT AGE 58 BILATERAL INCISION TO BREAST MADE  . Cancer     BREAST - left  . Breast cancer 06/06/2011    bc  left breast 3 o'clock  dx=invasive ductal ca uoqER/PR=positive  . Anxiety     panic disorder  . GERD (gastroesophageal reflux disease)     Pt. denies having GERD. Unable to remove it.  Marland Kitchen DJD (degenerative joint disease)   . Allergy   . Anemia     hx  . Leg swelling   . S/P radiation therapy 08/17/11 - 09/28/11    LLQ - 50 Gy/25 Fractions with Boost of 10 Gy / 5 fractions    Past Surgical History  Procedure Laterality Date  . Total abdominal hysterectomy  2000    Dr Jennette Kettle  . Trigger finger release  2008    Dr Mina Marble  . Bunionectomy  2011    bilateral by Dr Celene Skeen  . Tonsillectomy  1972  . Cyst removed from right hand  1995  . Colonoscopy      polyp  . Breast surgery  07/05/11    left breast lumpectomy with needle loc & axillary sln bx  . Biopsy breast      left breast  as a teenager  benign    Family History  Problem Relation Age of Onset  . Anemia Mother   . Other Mother     + H Pylori  . Prostate cancer Father   . Colon cancer Father     diagnosed 2009  . Cancer Father     colon and prostate cancer  . Breast cancer Sister     #1  .  Cancer Sister 30    breast cancer  . Sarcoidosis Sister     #2  . Multiple sclerosis Sister     #2  . Hypertension Brother     #1  . Cancer Brother     prostate cancer  . Prostate cancer Brother     #1  . Hyperlipidemia Brother     #1  . Cancer Cousin     breast cancer    History  Substance Use Topics  . Smoking status: Former Smoker -- 0.50 packs/day    Types: Cigarettes    Quit date: 04/16/2012  . Smokeless tobacco: Former Neurosurgeon    Quit date: 09/15/2011  . Alcohol Use: 0.0 oz/week    0-1 drink(s) per week     Comment: social    OB History   Grav Para Term Preterm Abortions TAB SAB Ect Mult Living                 Obstetric Comments   Menarche age 15, nulliparity, HRT x 1 year, Hysterectomy      Review of Systems  Constitutional: Positive for fever. Negative for chills, diaphoresis and appetite change.  HENT: Positive for  congestion. Negative for sore throat and trouble swallowing.   Eyes: Negative for discharge.  Respiratory: Positive for cough and wheezing.   Cardiovascular: Negative for chest pain, palpitations and leg swelling.  Gastrointestinal: Negative for nausea, abdominal pain and diarrhea.  Skin: Negative for rash.  Neurological: Negative for dizziness, seizures, syncope, weakness and headaches.  All other systems reviewed and are negative.    Allergies  Penicillins  Home Medications   Current Outpatient Rx  Name  Route  Sig  Dispense  Refill  . anastrozole (ARIMIDEX) 1 MG tablet   Oral   Take 1 mg by mouth daily.          Marland Kitchen buPROPion (WELLBUTRIN XL) 150 MG 24 hr tablet   Oral   Take 1 tablet (150 mg total) by mouth daily.   30 tablet   6   . CALCIUM PO   Oral   Take 1 tablet by mouth 2 (two) times daily.         . cholecalciferol (VITAMIN D) 1000 UNITS tablet   Oral   Take 1,000 Units by mouth daily.         Marland Kitchen albuterol (PROVENTIL HFA;VENTOLIN HFA) 108 (90 BASE) MCG/ACT inhaler   Inhalation   Inhale 1-2 puffs into the lungs every 6 (six) hours as needed for wheezing.   1 Inhaler   0   . ALPRAZolam (XANAX) 0.5 MG tablet      Take 1/2 to 1 tablet by mouth three times daily as needed for anxiety   90 tablet   5   . benzonatate (TESSALON) 100 MG capsule   Oral   Take 1 capsule (100 mg total) by mouth 3 (three) times daily as needed for cough.   21 capsule   0   . fluticasone (FLOVENT HFA) 220 MCG/ACT inhaler   Inhalation   Inhale 1 puff into the lungs 2 (two) times daily.   1 Inhaler   0   . HYDROcodone-acetaminophen (LORTAB) 7.5-500 MG/15ML solution   Oral   Take 10 mLs by mouth 2 (two) times daily as needed for pain or cough.   120 mL   0   . levofloxacin (LEVAQUIN) 500 MG tablet   Oral   Take 1 tablet (500 mg total) by mouth daily.   7  tablet   0   . loratadine (CLARITIN REDITABS) 10 MG dissolvable tablet   Oral   Take 1 tablet (10 mg total) by  mouth daily.   30 tablet   2   . non-metallic deodorant (ALRA) MISC   Topical   Apply 1 application topically daily as needed.         . predniSONE (DELTASONE) 20 MG tablet      Take 1 po bid x 4 days, 1 daily x 4 days, 1/2 tab daily x 4 days, 1/2 tab every other day until gone   16 tablet   0   . sodium chloride (OCEAN NASAL SPRAY) 0.65 % nasal spray   Nasal   Place 1 spray into the nose as needed for congestion.   30 mL   12     BP 157/95  Pulse 82  Temp(Src) 97.1 F (36.2 C) (Oral)  Resp 20  SpO2 95%  Physical Exam  Vitals reviewed. Constitutional: She is oriented to person, place, and time. She appears well-developed and well-nourished. No distress.  HENT:  Head: Normocephalic and atraumatic.  Nose: Nose normal.  Mouth/Throat: Oropharynx is clear and moist. No oropharyngeal exudate.  Eyes: Conjunctivae are normal. Right eye exhibits no discharge. Left eye exhibits no discharge.  Neck: Neck supple. No JVD present.  Cardiovascular: Normal rate, regular rhythm and normal heart sounds.   Pulmonary/Chest: Effort normal and breath sounds normal. She has no wheezes. She has no rales.  Bronchitic productive cough during exam  Abdominal: Soft.  Lymphadenopathy:    She has no cervical adenopathy.  Neurological: She is alert and oriented to person, place, and time.  Skin: No rash noted. She is not diaphoretic.    ED Course  Procedures (including critical care time)  Labs Reviewed - No data to display Dg Chest 2 View  04/23/2012  *RADIOLOGY REPORT*  Clinical Data: Cough and congestion.  History of breast cancer.  CHEST - 2 VIEW  Comparison: 12/18/2011 and prior chest radiograph  Findings: The cardiomediastinal silhouette is unremarkable. Mild peribronchial thickening is stable. There is no evidence of focal airspace disease, pulmonary edema, suspicious pulmonary nodule/mass, pleural effusion, or pneumothorax. No acute bony abnormalities are identified. Surgical clips  overlying the left breast again noted.  IMPRESSION: No evidence of acute cardiopulmonary disease.   Original Report Authenticated By: Harmon Pier, M.D.      1. Wheezing   2. Cough       MDM  Clinically well possible allergic component but my impression is that patient has chronic cough /chronic inflammatory bronchitic process possible related to sarcoidosis. Prescribed Flovent, albuterol, Tessalon Perles and hydrocodone at nighttime as needed. Reassured about taking Levaquin as prescribed by pulmonologist although no signs of consolidation on location x-rays.  Patient has a followup appointment with her pulmonologist on April. Supportive care and red flags that should prompt her return to medical attention discussed with patient and provided in writing.        Sharin Grave, MD 04/28/12 [phone]

## 2012-04-25 ENCOUNTER — Ambulatory Visit (INDEPENDENT_AMBULATORY_CARE_PROVIDER_SITE_OTHER): Payer: PRIVATE HEALTH INSURANCE | Admitting: General Surgery

## 2012-04-25 VITALS — BP 126/80 | HR 72 | Temp 98.0°F | Resp 18 | Ht 65.0 in | Wt 224.0 lb

## 2012-04-25 NOTE — Progress Notes (Signed)
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  Surgery date was April 2013. She reports no major problems with her breast. She does have some residual soreness. No lumps or skin changes. She is tolerating her anastrozole without difficulty. She has had some bronchitis but this is clearing up.  Exam: BP 126/80  Pulse 72  Temp(Src) 98 F (36.7 C)  Resp 18  Ht 5\' 5"  (1.651 m)  Wt 224 lb (101.606 kg)  BMI 37.28 kg/m2 General: Appears well Lymph nodes: No cervical, subclavicular or axillary nodes palpable Breasts: Mild post radiation changes on the left breast. Minimal thickening at the lumpectomy site lower outer quadrant. No masses in either breast.  Imaging: Mammogram due this Spring  Assessment and plan: Doing well following lumpectomy and sentinel lymph node biopsy, adjuvant hormonal treatment for T1 C. N0 cancer of the left breast. Will look for her mammogram this spring and patient is to return in 6 months.

## 2012-06-06 ENCOUNTER — Ambulatory Visit
Admission: RE | Admit: 2012-06-06 | Discharge: 2012-06-06 | Disposition: A | Discharge status: Home / Self Care | Payer: PRIVATE HEALTH INSURANCE | Source: Ambulatory Visit | Attending: Oncology | Admitting: Oncology

## 2012-06-06 DIAGNOSIS — Z853 Personal history of malignant neoplasm of breast: Secondary | ICD-10-CM

## 2012-06-09 ENCOUNTER — Encounter (HOSPITAL_COMMUNITY): Payer: Self-pay | Admitting: *Deleted

## 2012-06-09 ENCOUNTER — Emergency Department (INDEPENDENT_AMBULATORY_CARE_PROVIDER_SITE_OTHER)
Admission: EM | Admit: 2012-06-09 | Discharge: 2012-06-09 | Disposition: A | Discharge status: Home / Self Care | Payer: PRIVATE HEALTH INSURANCE | Source: Home / Self Care | Attending: Emergency Medicine | Admitting: Emergency Medicine

## 2012-06-09 ENCOUNTER — Telehealth (HOSPITAL_COMMUNITY): Payer: Self-pay | Admitting: Emergency Medicine

## 2012-06-09 DIAGNOSIS — L255 Unspecified contact dermatitis due to plants, except food: Secondary | ICD-10-CM

## 2012-06-09 MED ORDER — HYDROXYZINE HCL 25 MG PO TABS: 25.0000 mg | ORAL_TABLET | Freq: Four times a day (QID) | ORAL | Status: DC

## 2012-06-09 MED ORDER — CLOBETASOL PROPIONATE 0.05 % EX LOTN: TOPICAL_LOTION | CUTANEOUS | Status: DC

## 2012-06-09 MED ORDER — PREDNISONE 20 MG PO TABS: 40.0000 mg | ORAL_TABLET | Freq: Every day | ORAL | Status: AC

## 2012-06-09 NOTE — ED Notes (Signed)
Pharmacy called stating patient was waiting on prescriptions to be e-scribed.  According to chart they were printed.  Pharmacy states patient doesn't have them.  Orders given over the phone.  There was a question about the prednisone.  Directions state take 2 pills a day but quantity is 15.  Chart reviewed by Dr Ladon Applebaum.  Patient should take Prednisone 20 mg 2 pills once daily for 5 days making it a total of 10 pills.

## 2012-06-09 NOTE — ED Notes (Signed)
Pt reports poison ivy on hands, eyes and side of neck. " it seems to be spreading "

## 2012-06-09 NOTE — ED Provider Notes (Signed)
History     CSN: [id number]  Arrival date & time 06/09/12  1356   First MD Initiated Contact with Patient 06/09/12 1416      Chief Complaint  Patient presents with  . Poison Ivy    (Consider location/radiation/quality/duration/timing/severity/associated sxs/prior treatment) HPI Comments: Patient presents urgent care describing that after doing some yard work she started developing a itchy rash on both of her hands part of her neck and now the worst the upper eyelid of her right eye. " The rash is spreading", it's itchy looks like I have poison ivy. Most of my rash is on the dorsal aspect of his hand and also forearms.  Patient is a 57 y.o. female presenting with poison ivy. The history is provided by the patient.  Poison Lajoyce Corners This is a new problem. The current episode started more than 2 days ago. The problem occurs constantly. The problem has been gradually worsening. Nothing aggravates the symptoms. Nothing relieves the symptoms. Treatments tried: Tried triamcinolone at home.    Past Medical History  Diagnosis Date  . History of bronchitis   . History of sarcoidosis   . Cigarette smoker   . Hyperlipidemia   . Diverticulosis of colon   . Colon polyp   . DJD (degenerative joint disease)   . Vitamin D deficiency   . Panic disorder   . Depression   . History of anemia   . Complication of anesthesia     DIFFICULTY AWAKENING  . Headache   . Incisional breast wound     AT AGE 63 BILATERAL INCISION TO BREAST MADE  . Cancer     BREAST - left  . 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 Anxiety     panic disorder  . GERD (gastroesophageal reflux disease)     Pt. denies having GERD. Unable to remove it.  Marland Kitchen DJD (degenerative joint disease)   . Allergy   . Anemia     hx  . Leg swelling   . S/P radiation therapy 08/17/11 - 09/28/11    LLQ - 50 Gy/25 Fractions with Boost of 10 Gy / 5 fractions    Past Surgical History    Procedure Laterality Date  . Total abdominal hysterectomy  2000    Dr Jennette Kettle  . Trigger finger release  2008    Dr Mina Marble  . Bunionectomy  2011    bilateral by Dr Celene Skeen  . Tonsillectomy  1972  . Cyst removed from right hand  1995  . Colonoscopy      polyp  . Breast surgery  07/05/11    left breast lumpectomy with needle loc & axillary sln bx  . Biopsy breast      left breast  as a teenager  benign    Family History  Problem Relation Age of Onset  . Anemia Mother   . Other Mother     + H Pylori  . Prostate cancer Father   . Colon cancer Father     diagnosed 2009  . Cancer Father     colon and prostate cancer  . Breast cancer Sister     #1  . Cancer Sister 30    breast cancer  . Sarcoidosis Sister     #2  . Multiple sclerosis Sister     #2  . Hypertension Brother     #1  . Cancer Brother     prostate cancer  .  Prostate cancer Brother     #1  . Hyperlipidemia Brother     #1  . Cancer Cousin     breast cancer    History  Substance Use Topics  . Smoking status: Former Smoker -- 0.50 packs/day    Types: Cigarettes    Quit date: 04/16/2012  . Smokeless tobacco: Former Neurosurgeon    Quit date: 09/15/2011  . Alcohol Use: 0.0 oz/week    0-1 drink(s) per week     Comment: social    OB History   Grav Para Term Preterm Abortions TAB SAB Ect Mult Living                 Obstetric Comments   Menarche age 75, nulliparity, HRT x 1 year, Hysterectomy      Review of Systems  Constitutional: Negative for fever, chills, diaphoresis, activity change and appetite change.  Musculoskeletal: Negative for back pain and arthralgias.  Skin: Positive for rash. Negative for color change and pallor.    Allergies  Penicillins  Home Medications   Current Outpatient Rx  Name  Route  Sig  Dispense  Refill  . albuterol (PROVENTIL HFA;VENTOLIN HFA) 108 (90 BASE) MCG/ACT inhaler   Inhalation   Inhale 1-2 puffs into the lungs every 6 (six) hours as needed for wheezing.   1  Inhaler   0   . ALPRAZolam (XANAX) 0.5 MG tablet      Take 1/2 to 1 tablet by mouth three times daily as needed for anxiety   90 tablet   5   . anastrozole (ARIMIDEX) 1 MG tablet   Oral   Take 1 mg by mouth daily.          . benzonatate (TESSALON) 100 MG capsule   Oral   Take 1 capsule (100 mg total) by mouth 3 (three) times daily as needed for cough.   21 capsule   0   . buPROPion (WELLBUTRIN XL) 150 MG 24 hr tablet   Oral   Take 1 tablet (150 mg total) by mouth daily.   30 tablet   6   . CALCIUM PO   Oral   Take 1 tablet by mouth 2 (two) times daily.         . cholecalciferol (VITAMIN D) 1000 UNITS tablet   Oral   Take 1,000 Units by mouth daily.         . Clobetasol Propionate 0.05 % lotion      Apply to affected areas 2-times per day for 2 weeks   118 mL   1   . fluticasone (FLOVENT HFA) 220 MCG/ACT inhaler   Inhalation   Inhale 1 puff into the lungs 2 (two) times daily.   1 Inhaler   0   . HYDROcodone-acetaminophen (LORTAB) 7.5-500 MG/15ML solution   Oral   Take 10 mLs by mouth 2 (two) times daily as needed for pain or cough.   120 mL   0   . hydrOXYzine (ATARAX/VISTARIL) 25 MG tablet   Oral   Take 1 tablet (25 mg total) by mouth every 6 (six) hours.   12 tablet   0   . levofloxacin (LEVAQUIN) 500 MG tablet   Oral   Take 1 tablet (500 mg total) by mouth daily.   7 tablet   0   . loratadine (CLARITIN REDITABS) 10 MG dissolvable tablet   Oral   Take 1 tablet (10 mg total) by mouth daily.   30 tablet   2   .  non-metallic deodorant (ALRA) MISC   Topical   Apply 1 application topically daily as needed.         . predniSONE (DELTASONE) 20 MG tablet      Take 1 po bid x 4 days, 1 daily x 4 days, 1/2 tab daily x 4 days, 1/2 tab every other day until gone   16 tablet   0   . predniSONE (DELTASONE) 20 MG tablet   Oral   Take 2 tablets (40 mg total) by mouth daily.   15 tablet   0   . sodium chloride (OCEAN NASAL SPRAY) 0.65 %  nasal spray   Nasal   Place 1 spray into the nose as needed for congestion.   30 mL   12     BP 123/76  Pulse 86  Temp(Src) 98.2 F (36.8 C) (Oral)  Resp 16  SpO2 100%  Physical Exam  Constitutional: She appears well-developed and well-nourished.  Skin: Rash noted. Rash is papular and maculopapular. Rash is not macular, not nodular, not pustular, not vesicular and not urticarial. There is erythema.       ED Course  Procedures (including critical care time)  Labs Reviewed - No data to display No results found.   1. Contact dermatitis due to plant       MDM  Contact dermatitis, patient has been prescribed a course of prednisone for 5 days, hydroxyzine for pruritus. Along with clobetasol lotion.        Jimmie Molly, MD 06/09/12 [phone]

## 2012-06-17 ENCOUNTER — Encounter: Payer: Self-pay | Admitting: *Deleted

## 2012-06-17 ENCOUNTER — Other Ambulatory Visit (INDEPENDENT_AMBULATORY_CARE_PROVIDER_SITE_OTHER): Payer: PRIVATE HEALTH INSURANCE

## 2012-06-17 ENCOUNTER — Encounter: Payer: Self-pay | Admitting: Pulmonary Disease

## 2012-06-17 ENCOUNTER — Ambulatory Visit (INDEPENDENT_AMBULATORY_CARE_PROVIDER_SITE_OTHER): Payer: PRIVATE HEALTH INSURANCE | Admitting: Pulmonary Disease

## 2012-06-17 VITALS — BP 116/74 | HR 73 | Temp 97.8°F | Ht 65.0 in | Wt 223.8 lb

## 2012-06-17 DIAGNOSIS — F17200 Nicotine dependence, unspecified, uncomplicated: Secondary | ICD-10-CM

## 2012-06-17 DIAGNOSIS — F410 Panic disorder [episodic paroxysmal anxiety] without agoraphobia: Secondary | ICD-10-CM

## 2012-06-17 DIAGNOSIS — J40 Bronchitis, not specified as acute or chronic: Secondary | ICD-10-CM

## 2012-06-17 DIAGNOSIS — D869 Sarcoidosis, unspecified: Secondary | ICD-10-CM

## 2012-06-17 DIAGNOSIS — E785 Hyperlipidemia, unspecified: Secondary | ICD-10-CM

## 2012-06-17 DIAGNOSIS — E559 Vitamin D deficiency, unspecified: Secondary | ICD-10-CM

## 2012-06-17 DIAGNOSIS — K5730 Diverticulosis of large intestine without perforation or abscess without bleeding: Secondary | ICD-10-CM

## 2012-06-17 DIAGNOSIS — C50912 Malignant neoplasm of unspecified site of left female breast: Secondary | ICD-10-CM

## 2012-06-17 DIAGNOSIS — D126 Benign neoplasm of colon, unspecified: Secondary | ICD-10-CM

## 2012-06-17 DIAGNOSIS — K219 Gastro-esophageal reflux disease without esophagitis: Secondary | ICD-10-CM

## 2012-06-17 DIAGNOSIS — I872 Venous insufficiency (chronic) (peripheral): Secondary | ICD-10-CM

## 2012-06-17 DIAGNOSIS — M1990 Unspecified osteoarthritis, unspecified site: Secondary | ICD-10-CM

## 2012-06-17 LAB — LIPID PANEL
Cholesterol: 200 mg/dL (ref 0–200)
HDL: 64.9 mg/dL (ref >39.00)
LDL Cholesterol: 115 mg/dL — ABNORMAL HIGH (ref 0–99)
Total CHOL/HDL Ratio: 3
Triglycerides: 100 mg/dL (ref 0.0–149.0)
VLDL: 20 mg/dL (ref 0.0–40.0)

## 2012-06-17 LAB — CBC WITH DIFFERENTIAL/PLATELET
Basophils Absolute: 0 10*3/uL (ref 0.0–0.1)
Basophils Relative: 0.4 % (ref 0.0–3.0)
Eosinophils Absolute: 0.2 10*3/uL (ref 0.0–0.7)
Eosinophils Relative: 2.3 % (ref 0.0–5.0)
HCT: 38.9 % (ref 36.0–46.0)
Hemoglobin: 12.4 g/dL (ref 12.0–15.0)
Lymphocytes Relative: 16.9 % (ref 12.0–46.0)
Lymphs Abs: 1.2 10*3/uL (ref 0.7–4.0)
MCHC: 31.8 g/dL (ref 30.0–36.0)
MCV: 75.4 fl — ABNORMAL LOW (ref 78.0–100.0)
Monocytes Absolute: 0.6 10*3/uL (ref 0.1–1.0)
Monocytes Relative: 8.8 % (ref 3.0–12.0)
Neutro Abs: 5.1 10*3/uL (ref 1.4–7.7)
Neutrophils Relative %: 71.6 % (ref 43.0–77.0)
Platelets: 213 10*3/uL (ref 150.0–400.0)
RBC: 5.16 Mil/uL — ABNORMAL HIGH (ref 3.87–5.11)
RDW: 15.7 % — ABNORMAL HIGH (ref 11.5–14.6)
WBC: 7.2 10*3/uL (ref 4.5–10.5)

## 2012-06-17 LAB — HEPATIC FUNCTION PANEL
ALT: 17 U/L (ref 0–35)
AST: 13 U/L (ref 0–37)
Albumin: 3.9 g/dL (ref 3.5–5.2)
Alkaline Phosphatase: 80 U/L (ref 39–117)
Bilirubin, Direct: 0 mg/dL (ref 0.0–0.3)
Total Bilirubin: 0.6 mg/dL (ref 0.3–1.2)
Total Protein: 7.2 g/dL (ref 6.0–8.3)

## 2012-06-17 LAB — BASIC METABOLIC PANEL
CO2: 25 mEq/L (ref 19–32)
Chloride: 105 mEq/L (ref 96–112)
Creatinine, Ser: 0.8 mg/dL (ref 0.4–1.2)

## 2012-06-17 NOTE — Progress Notes (Signed)
Subjective:    Patient ID: Marilyn [name], female    DOB: Nov 24, 1955, 57 y.o.   MRN: [id number]  HPI 57 y/o BF here for a follow up visit... she has multiple medical problems as noted below...   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     Today she is concerned about a 20# weight gain up to 229# & she will see the Oncology nutritionists soon; she has noted some swelling in her ankles assoc w/ venous insuffic & we discussed no salt, elevation, & support hose for Rx;  Finally she also removed a tick from her abd the other day> we discussed Rx w/ Doxycycline empirically.    We reviewed prob list, meds, xrays and labs> see below>>  ~  December 18, 2011:  39mo ROV & Marilyn [name] is essent stable- notes tired, no energy, incr weight, some DOE, she is s/p 30 XRT treatments from [name], now on Arimidex 1mg  per [name]...  We reviewed the following medical problems during today's OV>>     Bronchitis, Sarcoid, Smoker> still smoking 1/2-1ppd; notes mild cough, sm amt beige phlegm, no CP, +Sob/Doe    VI> trace  edema; she knows to elim sodium, elev legs, wear support hose, not on diuretics as yet...    Chol> on diet alone; FLP shows TChol 214, TG 114, HDL 60, LDL 119    Overweight> she increased from 160#=> 229# due to Zoloft rx she says, then exac by foot surg & immobility...    GI- GERD, Divertics, IBS, Polyp, +FamHx> she continues to do well 7 denies abd pain, n/v, d/c, blood seen, etc...    Breast Cancer> on Arimidex per [name]...     DJD, VitD> off Tramadol, on OTC meds as needed; she notes some left hand paresthesias- rec wrist splint...    Headaches> no recent problems, she uses OTC meds as needed...    Anxiety, Panic> on Alpraz0.5mg  prn...    Anemia> CBC showed Hg 11.8=> 12.2 We reviewed prob list, meds, xrays and labs> see below for updates >>  CXR 10/13 showed normal heart size, atherosclerotic calcif, clear lungs, surg clips within the left breast... LABS 10/13:  FLP- on diet alone w/ TChol=214 LDL 119;  Chems- wnl;  CBC- ok w/ Hg=12.2 MCV=75 & rec to start on Fe;  TSH=1.65;  VitD=26  ~  March 25, 2012:  39mo ROV & add-on for cough> several wk hx cough, thick sput, w/o hemoptysis; congested, sl wheezy, feeling bad- went to ER & given ZPak, antihist, saline,  cough syrup, only min improved; still smoking 1/2-1ppd; Last CXR 10/13 was clear, NAD; Exam showed scat rhonchi & cough; we decided to treat w/ Depo & Pred taper...    Bronchitis, Sarcoid, Smoker> not on regular inhalers, she declines meds to aid smoking cessation...     Breast cancer> she had f/u Oncology 1/14- s/p left lumpectomy & sentinel node bx 4/13, s/p XRT from 6/13-8/13, now on Arimidex since 8/13 (tol well); she developed some lymphedema- seen in clinic & placed on compression sleeve; she is participating in a [facility] study re: skin changes after XRT...    We reviewed prob list, meds, xrays and labs> see below for updates >> she had the 2013 Flu vaccine 10/13...  ~  June 17, 2012:  65mo ROV & Marilyn [name] had recent poison ivy rash treated at  [facility] w/ Pred, now better;  We reviewed the following medical problems during today's office visit >>     Bronchitis, Sarcoid, Smoker> still smoking 1/2-1ppd (tried under the tongue lozenge); on Flovent/ Proventil Bid, MucinexAllergy; notes mild cough, sm amt beige phlegm, no CP, +Sob/Doe w/o change & asked to incr exercise...    VI> trace edema; she knows to elim sodium, elev legs, wear support hose, not on diuretics as yet...    Chol> on diet alone; FLP 4/14 shows TChol 200, TG 100, HDL 65, LDL 115    Overweight> she increased from 160#=> 229# due to Zoloft rx she says, now down 5# to 224#; needs better diet & incr exercise...    GI- GERD, Divertics, IBS, Polyp, +FamHx> she continues to do well & denies abd pain, n/v, d/c, blood seen, etc...    Breast Cancer> stage 1 invasive ductal carcinoma, s/p lumpectomy & sentinel node bx (neg) 4/13, followed by XRT 6-8/13, w/ Arimidex1mg /d starting 8/13 per [facility] & doing well...    DJD, VitD> off Tramadol, on OTC meds as needed; +Calcium/ VitD1000; she notes some left hand paresthesias- rec wrist splint...    Headaches> no recent problems, she uses OTC meds as needed...    Anxiety, Panic> on Alpraz0.5mg  prn & WellbutrinXL150 (per [facility]); she is under a lot of stress...    Anemia> CBC showed Hg 11.8=> 12.2 We reviewed prob list, meds, xrays and labs> see below for updates >>  LABS 4/14:  FLP- ok on diet alone x LDL=115;  Chems- wnl;  CBC- ok w/ Hg=12.6 MCV=75;  TSH=1.40...            Problem List:  PHYSICAL EXAMINATION (ICD-V70.0) - GYN= DrNeal who does her PAP smears, Mammograms (4/14- neg), and BMD... he also checked her VitD level and started VitD 50K weekly, then changed her to 2000 u daily... she is s/p hysterectomy in 2000... Note- she had an abd bruit investigated w/ an Abd MRA 6/08 and nothing found...  had TETANUS shot 2010 at [facility] after she stepped on nail... Routine Mammogram 3/13 showed left breast lesion leading to surg by DrHoxworth, XRT by  [name], Oncology f/u w/ [name]...   Hx of BRONCHITIS (ICD-490) - no recent episodes... Min cough & clear sputum... ~  Baseline CXR is clear and WNL (no residuals from her sarcoidosis) ~  Spirometry 7/06 showed FVC= 3.77 (130%), FEV1= 2.71 (113%), %1sec=72, mid-flows=66%... ~  CXR 11/12 showed normal heart size, min scarring, otherw clear, NAD.Marland Kitchen.  ~  CXR 10/13 showed normal heart size, atherosclerotic calcif, clear lungs, surg clips within the left breast... ~  1/14: she presented w/ refractory cough/ AB- given Depo, Pred  taper, & the usual OTC meds... ~  CXR 2/14 showed norm heart size, clear lungs, mild peribronch thickening, surg clips overlying left breast...  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 totally resolved on Pred therapy w/ normalization of CXR and no recurrence off Pred now since 1992... ~  CXR 9/10 remains clear & WNL.Marland Kitchen. ~  CXR 11/11 remains clear & WNL.Marland Kitchen. ~  CXR 11/12 remains clear, mild basilar atx, otherw WNL... ~  EKG 11/12 showed NSR, rate62, wnl, NAD.Marland Kitchen. ~  CXR 10/13 showed normal heart size, atherosclerotic calcif, clear lungs, surg clips within the left breast... ~  CXR 2/14 showed norm heart size, clear lungs, mild peribronch thickening, surg clips overlying left breast...  CIGARETTE SMOKER (ICD-305.1) - she was smoking 1/2 ppd> states she quit 10/12 & now back to 1/2-1ppd!!!  VENOUS INSUFFICIENCY (ICD-459.81) - she follows a low salt diet, elevates when nec, and wears support hose Prn.  HYPERLIPIDEMIA (ICD-272.4) - hx of hyperlipidemia w/ high HDL's prev... ~  FLP 7/07 showed TChol 210, TG 56, HDL 81, LDL 101 ~  FLP 6/08 showed TChol 232, TG 88, HDL 83, LDL 112 ~  FLP 8/09 showed TChol 199, TG 85, HDL 68, LDL 115 ~  FLP 9/10 (wt=175#) showed TChol 231, TG 78, HDL 94, LDL 120 ~  FLP 11/11 (wt=197#) showed TChol 265, TG 93, HDL 89, LDL 143... needs better diet get wt down. ~  FLP  11/12 (wt=210#) showed TChol 238, TG 79, HDL 83, LDL 148... Offered meds but she wants to see how she does on her wt loss program. ~  FLP 3/13 (wt=229#) showed TChol 235, TG 89, HDL 85, LDL 130 ~  FLP 10/13 (wt=229#) showed TChol 214, TG 114, HDL 60, LDL 119  ~  FLP 4/14 (wt=224#) showed TChol 200, TG 100, HDL 65, LDL 115  GERD (ICD-530.81) - uses OTC Prilosec as needed.  DIVERTICULOSIS OF COLON (ICD-562.10) IRRITABLE BOWEL SYNDROME (ICD-564.1) COLONIC POLYPS (ICD-211.3) Family Hx of COLON CANCER (ICD-153.9) -  ~  colonoscopy 9/06 by Dorris Singh was WNL- no divertics, no polyps... f/u planned 4yrs. ~  Father was diagnosed w/ colon cancer in 2009 & being treated by oncology... pt will need colon f/u in 2011. ~  f/u colonoscopy 9/11 by [name] showed divertics & 2 polyps= polypoid mucosa, f/u planned 76yrs.  Hx of UTI (ICD-599.0)  BREAST CANCER >> Routine mammogram 3/13 showed a lesion on the left breast >> See above:  Surg- [name],  XRT- [name],  Oncology- [name]... 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 ~  1/14: she had f/u [facility]> stage 1 invasive ductal carcinoma, s/p lumpectomy & sentinel node bx (neg) 4/13, followed by XRT 6-8/13, w/ Arimidex1mg /d starting 8/13; doing satis & no changes made... ~  2/14: she had ROV [name]> doing satis, no problems.Marland KitchenMarland Kitchen  DEGENERATIVE JOINT DISEASE  (ICD-715.90) - she has mild DJD esp in knees & prev had cortisone shots from ortho- DrMcKinley... not on regular meds, she uses Tylenol Prn...  VITAMIN D DEFICIENCY (ICD-268.9) - followed by DrNeal & currently taking Vit D 1000 u caps- 2 daily. ~  Labs here 11/12 showed Vit D level = 37  HEADACHE (ICD-784.0)  Hx Panic Disorder> Improved w/o attacks in several yrs; she has ALPRAZOLAM0.5mg  for prn use but seldom needs it she says... Hx of DEPRESSION (ICD-311) - husb passed away in Jul 31, 2003... she sees DrMcKinney Financial risk analyst) on ZOLOFT 100mg /d...  Hx of ANEMIA (ICD-285.9) - Hg chronically in the 11-12 range... ~  labs 9/10 showed Hg= 11.9, MCV= 77, Fe= 76 (21%sat) ~  labs 11/11 showed Hg= 12.0, MCV= 76, Fe= 80 (20%sat) ~  Labs 11/12 showed Hg= 12.1, MCV= 75, Fe=68 (19%sat) ~  Labs 4/13 showed Hg= 11.2 - 12.6 ~  Labs 10/13 showed Hg= 12.2, MCV= 75, Rec to start Fe daily... ~  Labs 4/14 showed Hg= 12.4, MCV= 75, & rec to restart thew FeSO4 daily...   Past Surgical History  Procedure Laterality Date  . Total abdominal hysterectomy  07-31-98    Dr Jennette Kettle  . Trigger finger release  2006/07/31    Dr Mina Marble  . Bunionectomy  Jul 30, 2009    bilateral by Dr Celene Skeen  . Tonsillectomy  1972  . Cyst removed from right hand  1995  . Colonoscopy      polyp  . Breast surgery  07/05/11    left breast lumpectomy with needle loc & axillary sln bx  . Biopsy breast      left breast  as a teenager  benign    Outpatient Encounter Prescriptions as of 06/17/2012  Medication Sig Dispense Refill  . albuterol (PROVENTIL HFA;VENTOLIN HFA) 108 (90 BASE) MCG/ACT inhaler Inhale 1-2 puffs into the lungs every 6 (six) hours as needed for wheezing.  1 Inhaler  0  . ALPRAZolam (XANAX) 0.5 MG tablet Take 1/2 to 1 tablet by mouth three times daily as needed for anxiety  90 tablet  5  . anastrozole (ARIMIDEX) 1 MG tablet Take 1 mg by mouth daily.       Marland Kitchen buPROPion (WELLBUTRIN XL) 150 MG 24 hr tablet Take 1 tablet (150 mg total) by mouth  daily.  30 tablet  6  . CALCIUM PO Take 1 tablet by mouth 2 (two) times daily.      . cholecalciferol (VITAMIN D) 1000 UNITS tablet Take 1,000 Units by mouth daily.      . Clobetasol Propionate 0.05 % lotion Apply to affected areas 2-times per day for 2 weeks  118 mL  1  . fluticasone (FLOVENT HFA) 220 MCG/ACT inhaler Inhale 1 puff into the lungs 2 (two) times daily.  1 Inhaler  0  . non-metallic deodorant (ALRA) MISC Apply 1 application topically daily as needed.      . sodium chloride (OCEAN NASAL SPRAY) 0.65 % nasal spray Place 1 spray into the nose as needed for congestion.  30 mL  12  . [DISCONTINUED] benzonatate (TESSALON) 100 MG capsule Take 1 capsule (100 mg total) by mouth 3 (three) times daily as needed for cough.  21 capsule  0  . [DISCONTINUED] HYDROcodone-acetaminophen (LORTAB) 7.5-500 MG/15ML solution Take 10 mLs by mouth 2 (two) times daily as needed for pain or cough.  120 mL  0  . [DISCONTINUED] hydrOXYzine (ATARAX/VISTARIL) 25 MG tablet Take 1 tablet (25 mg total) by  mouth every 6 (six) hours.  12 tablet  0  . [DISCONTINUED] levofloxacin (LEVAQUIN) 500 MG tablet Take 1 tablet (500 mg total) by mouth daily.  7 tablet  0  . [DISCONTINUED] loratadine (CLARITIN REDITABS) 10 MG dissolvable tablet Take 1 tablet (10 mg total) by mouth daily.  30 tablet  2  . [DISCONTINUED] predniSONE (DELTASONE) 20 MG tablet Take 1 po bid x 4 days, 1 daily x 4 days, 1/2 tab daily x 4 days, 1/2 tab every other day until gone  16 tablet  0   No facility-administered encounter medications on file as of 06/17/2012.    Allergies  Allergen Reactions  . Penicillins Rash    At injection site.    Current Medications, Allergies, Past Medical History, Past Surgical History, Family History, and Social History were reviewed in [employer] record.    Review of Systems    The patient notes some DOE- improved from last yr.  She denies fever, chills, sweats, anorexia, fatigue, weakness,  malaise, weight loss, sleep disorder, blurring, diplopia, eye irritation, eye discharge, vision loss, eye pain, photophobia, earache, ear discharge, tinnitus, decreased hearing, nasal congestion, nosebleeds, sore throat, hoarseness, chest pain, palpitations, syncope, orthopnea, PND, peripheral edema, cough, dyspnea at rest, excessive sputum, hemoptysis, wheezing, pleurisy, nausea, vomiting, diarrhea, constipation, change in bowel habits, abdominal pain, melena, hematochezia, jaundice, gas/bloating, indigestion/heartburn, dysphagia, odynophagia, dysuria, hematuria, urinary frequency, urinary hesitancy, nocturia, incontinence, back pain, joint pain, joint swelling, muscle cramps, muscle weakness, stiffness, arthritis, sciatica, restless legs, leg pain at night, leg pain with exertion, rash, itching, dryness, suspicious lesions, paralysis, paresthesias, seizures, tremors, vertigo, transient blindness, frequent falls, frequent headaches, difficulty walking, depression, anxiety, memory loss, confusion, cold intolerance, heat intolerance, polydipsia, polyphagia, polyuria, unusual weight change, abnormal bruising, bleeding, enlarged lymph nodes, urticaria, allergic rash, hay fever, and recurrent infections.     Objective:   Physical Exam     WD, overweight, 57 y/o BF in NAD... GENERAL:  Alert & oriented; pleasant & cooperative... HEENT:  [place]/AT, EOM-wnl, PERRLA, EACs-clear, TMs-wnl, NOSE-clear, THROAT-clear & wnl. NECK:  Supple w/ full ROM; no JVD; normal carotid impulses w/o bruits; no thyromegaly or nodules palpated; no lymphadenopathy. CHEST:  Clear to P & A; few scat rhonchi at bases & end exp wheezing... HEART:  Regular Rhythm; without murmurs/ rubs/ or gallops. ABDOMEN:  Soft & nontender; normal bowel sounds; no organomegaly or masses detected... EXT: without deformities, mild arthritic changes; no varicose veins/ venous insuffic/ or edema. NEURO:  CN's intact; motor testing normal; sensory testing  normal; gait normal & balance OK. DERM:  No lesions noted; no rash etc...  RADIOLOGY DATA:  Reviewed in the EPIC EMR & discussed w/ the patient...  LABORATORY DATA:  Reviewed in the EPIC EMR & discussed w/ the patient...   Assessment & Plan:    Ex-smoker, Hx Bronchits, Hx Sarcoid>  Hx of recurrent AB w/ refractory cough, congestion; she's had ZPak, Depo, Pred taper, Mucinex, Fluids & resolved; NEEDS TO QUIT SMOKING!!!  Ven Insuffic>  Aware- avoid sodium, elevate, support hose...  Hyperlipid>  Better controlled on her diet; she doesn't want meds & hopeful that new diet plan will help this as well as her weight...  GI> GERD, Divertics, IBS, Polyps>  Stable on Rx, followed by [name] & up to date...  BREAST CANCER> followed by [name] on Arimidex...  DJD>  She uses OTC analgesics as needed; notes incr symptoms w/ wt gain & hoping for improvement on diet plan...  Hx Depression>  She was followed by psychiatrist prev on Zoloft & on prn Alpraz which she seldom takes + Wellbutrin started by [employer]...  Hx Anemia>  Hg stable ~12 w/ sm cells & Fe levels wnl; poss Thalassemia minor, it still wouldn't hurt to take Fe daily supplement...   Patient's Medications  New Prescriptions   No medications on file  Previous Medications   ALBUTEROL (PROVENTIL HFA;VENTOLIN HFA) 108 (90 BASE) MCG/ACT INHALER    Inhale 1-2 puffs into the lungs every 6 (six) hours as needed for wheezing.   ALPRAZOLAM (XANAX) 0.5 MG TABLET    Take 1/2 to 1 tablet by mouth three times daily as needed for anxiety   ANASTROZOLE (ARIMIDEX) 1 MG TABLET    Take 1 mg by mouth daily.    CALCIUM PO    Take 1 tablet by mouth 2 (two) times daily.   CHOLECALCIFEROL (VITAMIN D) 1000 UNITS TABLET    Take 1,000 Units by mouth daily.   CLOBETASOL PROPIONATE 0.05 % LOTION    Apply to affected areas 2-times per day for 2 weeks   FLUTICASONE (FLOVENT HFA) 220 MCG/ACT INHALER    Inhale 1 puff into the lungs 2 (two) times daily.   NON-METALLIC  DEODORANT (ALRA) MISC    Apply 1 application topically daily as needed.   SODIUM CHLORIDE (OCEAN NASAL SPRAY) 0.65 % NASAL SPRAY    Place 1 spray into the nose as needed for congestion.  Modified Medications   Modified Medication Previous Medication   BUPROPION (WELLBUTRIN XL) 150 MG 24 HR TABLET buPROPion (WELLBUTRIN XL) 150 MG 24 hr tablet      Take 1 tablet (150 mg total) by mouth daily.    Take 1 tablet (150 mg total) by mouth daily.  Discontinued Medications   BENZONATATE (TESSALON) 100 MG CAPSULE    Take 1 capsule (100 mg total) by mouth 3 (three) times daily as needed for cough.   HYDROCODONE-ACETAMINOPHEN (LORTAB) 7.5-500 MG/15ML SOLUTION    Take 10 mLs by mouth 2 (two) times daily as needed for pain or cough.   HYDROXYZINE (ATARAX/VISTARIL) 25 MG TABLET    Take 1 tablet (25 mg total) by mouth every 6 (six) hours.   LEVOFLOXACIN (LEVAQUIN) 500 MG TABLET    Take 1 tablet (500 mg total) by mouth daily.   LORATADINE (CLARITIN REDITABS) 10 MG DISSOLVABLE TABLET    Take 1 tablet (10 mg total) by mouth daily.   PREDNISONE (DELTASONE) 20 MG TABLET    Take 1 po bid x 4 days, 1 daily x 4 days, 1/2 tab daily x 4 days, 1/2 tab every other day until gone

## 2012-06-17 NOTE — Patient Instructions (Addendum)
Today we updated your med list in our EPIC system...    Continue your current medications the same...  Today we did your follow up FASTING blood work...    We will contact you w/ the results when available...   Call for any questions...  Let's plan a follow up visit in 6mo, sooner if needed for problems...   

## 2012-08-21 ENCOUNTER — Other Ambulatory Visit: Payer: Self-pay | Admitting: *Deleted

## 2012-08-21 DIAGNOSIS — C50419 Malignant neoplasm of upper-outer quadrant of unspecified female breast: Secondary | ICD-10-CM

## 2012-08-21 MED ORDER — BUPROPION HCL ER (XL) 150 MG PO TB24: 150.0000 mg | ORAL_TABLET | Freq: Every day | ORAL | Status: DC

## 2012-09-17 ENCOUNTER — Telehealth: Payer: Self-pay | Admitting: *Deleted

## 2012-09-17 NOTE — Telephone Encounter (Signed)
I called patient to remind her of her appointment with Dr. Welton Flakes 09/19/12. She denied any questions or concerns at this time.  I told her that I will meet with her on Thursday when she is here for her appointment to discuss any needs she may have.  I gave her my contact information and encouraged her to call as needed.

## 2012-09-19 ENCOUNTER — Encounter: Payer: Self-pay | Admitting: Oncology

## 2012-09-19 ENCOUNTER — Ambulatory Visit (HOSPITAL_BASED_OUTPATIENT_CLINIC_OR_DEPARTMENT_OTHER): Payer: PRIVATE HEALTH INSURANCE | Admitting: Oncology

## 2012-09-19 ENCOUNTER — Telehealth: Payer: Self-pay | Admitting: *Deleted

## 2012-09-19 ENCOUNTER — Encounter: Payer: Self-pay | Admitting: *Deleted

## 2012-09-19 VITALS — BP 144/80 | HR 76 | Temp 98.0°F | Resp 20 | Ht 65.0 in | Wt 227.3 lb

## 2012-09-19 DIAGNOSIS — C50512 Malignant neoplasm of lower-outer quadrant of left female breast: Secondary | ICD-10-CM

## 2012-09-19 DIAGNOSIS — C50419 Malignant neoplasm of upper-outer quadrant of unspecified female breast: Secondary | ICD-10-CM

## 2012-09-19 DIAGNOSIS — C50519 Malignant neoplasm of lower-outer quadrant of unspecified female breast: Secondary | ICD-10-CM

## 2012-09-19 DIAGNOSIS — F17200 Nicotine dependence, unspecified, uncomplicated: Secondary | ICD-10-CM

## 2012-09-19 DIAGNOSIS — C50412 Malignant neoplasm of upper-outer quadrant of left female breast: Secondary | ICD-10-CM

## 2012-09-19 NOTE — Telephone Encounter (Signed)
appts made and printed. gv pt info for smoking cessation...td

## 2012-09-19 NOTE — Patient Instructions (Addendum)
Doing well Continue anastrozole daily Refer to smoking cessation program

## 2012-09-19 NOTE — Progress Notes (Signed)
Patient at [facility] for office visit with Dr. Welton Flakes.  I met with patient to introduce myself as the [employer].  She reports that she is doing well.  She denies having any problems or concerns at this time.  She did report that she is interested in participating in some of the classes offered here at [facility].  I gave her copies of the July and August calendars and we reviewed some of the classes available.  At her request, I am setting her up to receive the calendar monthly.  I gave patient my contact information and encouraged her to call me for any needs.

## 2012-09-19 NOTE — Progress Notes (Signed)
09/19/2012 at 10:49am - 12 months post radiation visit- The pt was into the cancer center today for her 12 months post radiation visit.  She states she has been doing well.  The pt completed her questionnaires in the clinic this am.  The pt's photographs were taken.  The pt has not had a recurrence of her breast cancer.  Her mammogram in April stated "no mammographic evidence for malignancy".  She denies using any new skin products on her treated breast.  She confirmed that she is still taking her Arimidex daily.  The pt was seen and examined today by Dr. Welton Flakes.  Dr. Welton Flakes was given the RTOG SOMA Evaluation form to complete.  The pt was thanked for her support of this clinical study.

## 2012-10-06 NOTE — Progress Notes (Signed)
[facility]  Telephone:(336) [phone] Fax:([phone]   OFFICE PROGRESS NOTE   Cc:  Michele Mcalpine, MD  [facility] [address]  [place] [address]  Dr. Glenna Fellows  Dr. Varney Baas  Dr. Lonie Peak   DIAGNOSIS: 57 year old female who originally was seen in the multidisciplinary breast clinic for a stage I invasive ductal carcinoma of the left breast found on a screening mammogram.   PRIOR THERAPY:   #1 patient originally seen in the multidisciplinary breast clinic after needle core biopsy that showed a left invasive ductal carcinoma, grade 1 ER positive PR positive HER-2/neu negative with Ki-67 of 6%.   #2 patient has now gone on to have a lumpectomy of the left breast with sentinel node biopsy performed on 06/20/2011.   #3 the final pathology revealed a 1.0 cm invasive ductal carcinoma grade 1 ER positive PR positive HER-2/neu negative with Ki-67 of 6%.   #4 patient is currently receiving radiation therapy from 08/17/2011 , completed on 09/28/11  #5 she will begun adjuvant antiestrogen therapy consisting of Arimidex 1 mg daily Beginning October 02, 2011  CURRENT THERAPY: Arimidex 1 mg daily since October 02, 2011.    INTERVAL HISTORY:  Marilyn [name] 57 y.o. female returns for followup visit today.Overall patient is doing well she is tolerating Arimidex quite well. . She otherwise denies any fevers chills night sweats headaches shortness of breath chest pains palpitations no myalgias or arthralgias. She is currently on a study at [facility] for evaluation of post radiation skin changes and the tendency to develop skin  fibrosis, telagenctasia, hypopigmentation, or edema based on chemical  Abnormalities in labs including UA and CMET. It is called "Impact in Genomics and Exposures on Disparities in Breast Cancer Radiosensitivity" patient case number [id number].She denies any skin modifications, except for occasional hypersensitivity and  pruritis. Denies severe hot flashes, or vaginal bleeding. No memory abnormalities.   Remainder of the 10 point review of systems is negative. She is due for a  R mammogram  and L [place] 06/06/2011   Past Medical History  Diagnosis Date  . History of bronchitis   . History of sarcoidosis   . Cigarette smoker   . Hyperlipidemia   . Diverticulosis of colon   . Colon polyp   . DJD (degenerative joint disease)   . Vitamin D deficiency   . Panic disorder   . Depression   . History of anemia   . Complication of anesthesia     DIFFICULTY AWAKENING  . Headache(784.0)   . Incisional breast wound     AT AGE 22 BILATERAL INCISION TO BREAST MADE  . Cancer     BREAST - left  . 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 Anxiety     panic disorder  . GERD (gastroesophageal reflux disease)     Pt. denies having GERD. Unable to remove it.  Marland Kitchen DJD (degenerative joint disease)   . Allergy   . Anemia     hx  . Leg swelling   . S/P radiation therapy 08/17/11 - 09/28/11    LLQ - 50 Gy/25 Fractions with Boost of 10 Gy / 5 fractions    Past Surgical History  Procedure Laterality Date  . Total abdominal hysterectomy  2000    Dr Jennette Kettle  . Trigger finger release  2008    Dr Mina Marble  . Bunionectomy  2011  bilateral by Dr Celene Skeen  . Tonsillectomy  1972  . Cyst removed from right hand  1995  . Colonoscopy      polyp  . Breast surgery  07/05/11    left breast lumpectomy with needle loc & axillary sln bx  . Biopsy breast      left breast  as a teenager  benign    Current Outpatient Prescriptions  Medication Sig Dispense Refill  . albuterol (PROVENTIL HFA;VENTOLIN HFA) 108 (90 BASE) MCG/ACT inhaler Inhale 1-2 puffs into the lungs every 6 (six) hours as needed for wheezing.  1 Inhaler  0  . ALPRAZolam (XANAX) 0.5 MG tablet Take 1/2 to 1 tablet by mouth three times daily as needed for anxiety  90 tablet  5  . anastrozole (ARIMIDEX) 1 MG tablet Take 1  mg by mouth daily.       Marland Kitchen buPROPion (WELLBUTRIN XL) 150 MG 24 hr tablet Take 1 tablet (150 mg total) by mouth daily.  90 tablet  0  . cholecalciferol (VITAMIN D) 1000 UNITS tablet Take 1,000 Units by mouth daily.      . Clobetasol Propionate 0.05 % lotion Apply to affected areas 2-times per day for 2 weeks  118 mL  1  . sodium chloride (OCEAN NASAL SPRAY) 0.65 % nasal spray Place 1 spray into the nose as needed for congestion.  30 mL  12  . CALCIUM PO Take 1 tablet by mouth 2 (two) times daily.      . fluticasone (FLOVENT HFA) 220 MCG/ACT inhaler Inhale 1 puff into the lungs 2 (two) times daily.  1 Inhaler  0  . non-metallic deodorant (ALRA) MISC Apply 1 application topically daily as needed.       No current facility-administered medications for this visit.    ALLERGIES:  is allergic to penicillins.  REVIEW OF SYSTEMS:  No respiratory or cardiac complaints.  The rest of the 14-point review of system was negative.   Filed Vitals:   09/19/12 1044  BP: 144/80  Pulse: 76  Temp: 98 F (36.7 C)  Resp: 20   Wt Readings from Last 3 Encounters:  09/19/12 227 lb 4.8 oz (103.103 kg)  06/17/12 223 lb 12.8 oz (101.515 kg)  04/25/12 224 lb (101.606 kg)     PHYSICAL EXAMINATION:  BP 144/80  Pulse 76  Temp(Src) 98 F (36.7 C) (Oral)  Resp 20  Ht 5\' 5"  (1.651 m)  Wt 227 lb 4.8 oz (103.103 kg)  BMI 37.82 kg/m2  GENERAL: Well developed, well nourished, in no acute distress.  EENT: No ocular or oral lesions. No stomatitis.  Resp: clear to auscultation bilaterally and normal percussion bilaterally  Cardio: regular rate and rhythm, S1, S2 normal, no murmur, click, rub or gallop  GI: soft, non-tender; bowel sounds normal; no masses, no organomegaly  Extremities: extremities normal, atraumatic, no cyanosis or edema  Neurologic: Grossly normal  Left breast examination reveals a well incision scar without any other masses or nipple discharge right breast no masses or nipple discharge.    ECOG PERFORMANCE STATUS: 0 - Asymptomatic    LABORATORY/RADIOLOGY DATA:  Lab Results  Component Value Date   WBC 7.2 06/17/2012   HGB 12.4 06/17/2012   HCT 38.9 06/17/2012   PLT 213.0 06/17/2012   GLUCOSE 95 06/17/2012   CHOL 200 06/17/2012   TRIG 100.0 06/17/2012   HDL 64.90 06/17/2012   LDLDIRECT 118.9 12/18/2011   LDLCALC 115* 06/17/2012   ALKPHOS 80 06/17/2012   ALT 17 06/17/2012  AST 13 06/17/2012   NA 139 06/17/2012   K 4.4 06/17/2012   CL 105 06/17/2012   CREATININE 0.8 06/17/2012   BUN 13 06/17/2012   CO2 25 06/17/2012        ASSESSMENT AND PLAN:   1. Evern Bio 56 y.o. female with   #1 stage I invasive ductal carcinoma of the left breast status post lumpectomy the final pathology revealed a 1.0 cm ER/PR positive HER-2/neu negative grade 1 disease. Sentinel node was negative for metastatic disease.Patient completed her radiation therapy on 09/28/2011. She was then begun on adjuvant antiestrogen therapy with Arimidex 1 mg daily. She is tolerating this well. She has no evidence of recurrent disease.   PLAN:   #1 patient is tolerating Arimidex well without any significant complaints.   #2 Refer to smoking cessation program  All questions were answered. The patient knows to call the clinic with any problems, questions or concerns. We can certainly see the patient much sooner if necessary.   Drue Second, MD Medical/Oncology [facility] [phone] (beeper) [phone] (Office)

## 2012-10-21 ENCOUNTER — Other Ambulatory Visit: Payer: Self-pay | Admitting: *Deleted

## 2012-10-21 DIAGNOSIS — C50512 Malignant neoplasm of lower-outer quadrant of left female breast: Secondary | ICD-10-CM

## 2012-10-21 MED ORDER — ANASTROZOLE 1 MG PO TABS: 1.0000 mg | ORAL_TABLET | Freq: Every day | ORAL | Status: DC

## 2012-12-02 ENCOUNTER — Other Ambulatory Visit: Payer: Self-pay | Admitting: *Deleted

## 2012-12-02 DIAGNOSIS — C50419 Malignant neoplasm of upper-outer quadrant of unspecified female breast: Secondary | ICD-10-CM

## 2012-12-02 DIAGNOSIS — F17200 Nicotine dependence, unspecified, uncomplicated: Secondary | ICD-10-CM

## 2012-12-02 MED ORDER — BUPROPION HCL ER (XL) 150 MG PO TB24: 150.0000 mg | ORAL_TABLET | Freq: Every day | ORAL | Status: DC

## 2013-01-02 ENCOUNTER — Other Ambulatory Visit: Payer: Self-pay

## 2013-03-24 ENCOUNTER — Other Ambulatory Visit (HOSPITAL_BASED_OUTPATIENT_CLINIC_OR_DEPARTMENT_OTHER): Payer: PRIVATE HEALTH INSURANCE

## 2013-03-24 ENCOUNTER — Telehealth: Payer: Self-pay | Admitting: Oncology

## 2013-03-24 ENCOUNTER — Encounter: Payer: Self-pay | Admitting: Oncology

## 2013-03-24 ENCOUNTER — Ambulatory Visit (HOSPITAL_BASED_OUTPATIENT_CLINIC_OR_DEPARTMENT_OTHER): Payer: PRIVATE HEALTH INSURANCE | Admitting: Oncology

## 2013-03-24 VITALS — BP 135/89 | HR 78 | Temp 98.3°F | Resp 18 | Ht 65.0 in | Wt 231.9 lb

## 2013-03-24 DIAGNOSIS — R635 Abnormal weight gain: Secondary | ICD-10-CM

## 2013-03-24 DIAGNOSIS — C50919 Malignant neoplasm of unspecified site of unspecified female breast: Secondary | ICD-10-CM

## 2013-03-24 DIAGNOSIS — C50419 Malignant neoplasm of upper-outer quadrant of unspecified female breast: Secondary | ICD-10-CM

## 2013-03-24 DIAGNOSIS — C50512 Malignant neoplasm of lower-outer quadrant of left female breast: Secondary | ICD-10-CM

## 2013-03-24 DIAGNOSIS — C50519 Malignant neoplasm of lower-outer quadrant of unspecified female breast: Secondary | ICD-10-CM

## 2013-03-24 DIAGNOSIS — M2550 Pain in unspecified joint: Secondary | ICD-10-CM

## 2013-03-24 LAB — CBC WITH DIFFERENTIAL/PLATELET
BASO%: 0.6 % (ref 0.0–2.0)
BASOS ABS: 0 10*3/uL (ref 0.0–0.1)
EOS%: 1.9 % (ref 0.0–7.0)
Eosinophils Absolute: 0.1 10*3/uL (ref 0.0–0.5)
HEMATOCRIT: 39.1 % (ref 34.8–46.6)
HEMOGLOBIN: 12.6 g/dL (ref 11.6–15.9)
LYMPH#: 1.6 10*3/uL (ref 0.9–3.3)
LYMPH%: 21.6 % (ref 14.0–49.7)
MCH: 24 pg — AB (ref 25.1–34.0)
MCHC: 32.3 g/dL (ref 31.5–36.0)
MCV: 74.3 fL — AB (ref 79.5–101.0)
MONO#: 0.6 10*3/uL (ref 0.1–0.9)
MONO%: 8.2 % (ref 0.0–14.0)
NEUT#: 5 10*3/uL (ref 1.5–6.5)
NEUT%: 67.7 % (ref 38.4–76.8)
PLATELETS: 221 10*3/uL (ref 145–400)
RBC: 5.27 10*6/uL (ref 3.70–5.45)
RDW: 14.4 % (ref 11.2–14.5)
WBC: 7.3 10*3/uL (ref 3.9–10.3)

## 2013-03-24 LAB — COMPREHENSIVE METABOLIC PANEL (CC13)
ALT: 18 U/L (ref 0–55)
ANION GAP: 9 meq/L (ref 3–11)
AST: 12 U/L (ref 5–34)
Albumin: 3.8 g/dL (ref 3.5–5.0)
Alkaline Phosphatase: 121 U/L (ref 40–150)
BILIRUBIN TOTAL: 0.39 mg/dL (ref 0.20–1.20)
BUN: 10.8 mg/dL (ref 7.0–26.0)
CALCIUM: 10.1 mg/dL (ref 8.4–10.4)
CHLORIDE: 107 meq/L (ref 98–109)
CO2: 26 mEq/L (ref 22–29)
CREATININE: 0.8 mg/dL (ref 0.6–1.1)
Glucose: 105 mg/dl (ref 70–140)
Potassium: 4 mEq/L (ref 3.5–5.1)
Sodium: 142 mEq/L (ref 136–145)
Total Protein: 7.3 g/dL (ref 6.4–8.3)

## 2013-03-24 NOTE — Patient Instructions (Signed)
Smoking Cessation Quitting smoking is important to your health and has many advantages. However, it is not always easy to quit since nicotine is a very addictive drug. Often times, people try 3 times or more before being able to quit. This document explains the best ways for you to prepare to quit smoking. Quitting takes hard work and a lot of effort, but you can do it. ADVANTAGES OF QUITTING SMOKING  You will live longer, feel better, and live better.  Your body will feel the impact of quitting smoking almost immediately.  Within 20 minutes, blood pressure decreases. Your pulse returns to its normal level.  After 8 hours, carbon monoxide levels in the blood return to normal. Your oxygen level increases.  After 24 hours, the chance of having a heart attack starts to decrease. Your breath, hair, and body stop smelling like smoke.  After 48 hours, damaged nerve endings begin to recover. Your sense of taste and smell improve.  After 72 hours, the body is virtually free of nicotine. Your bronchial tubes relax and breathing becomes easier.  After 2 to 12 weeks, lungs can hold more air. Exercise becomes easier and circulation improves.  The risk of having a heart attack, stroke, cancer, or lung disease is greatly reduced.  After 1 year, the risk of coronary heart disease is cut in half.  After 5 years, the risk of stroke falls to the same as a nonsmoker.  After 10 years, the risk of lung cancer is cut in half and the risk of other cancers decreases significantly.  After 15 years, the risk of coronary heart disease drops, usually to the level of a nonsmoker.  If you are pregnant, quitting smoking will improve your chances of having a healthy baby.  The people you live with, especially any children, will be healthier.  You will have extra money to spend on things other than cigarettes. QUESTIONS TO THINK ABOUT BEFORE ATTEMPTING TO QUIT You may want to talk about your answers with your  caregiver.  Why do you want to quit?  If you tried to quit in the past, what helped and what did not?  What will be the most difficult situations for you after you quit? How will you plan to handle them?  Who can help you through the tough times? Your family? Friends? A caregiver?  What pleasures do you get from smoking? What ways can you still get pleasure if you quit? Here are some questions to ask your caregiver:  How can you help me to be successful at quitting?  What medicine do you think would be best for me and how should I take it?  What should I do if I need more help?  What is smoking withdrawal like? How can I get information on withdrawal? GET READY  Set a quit date.  Change your environment by getting rid of all cigarettes, ashtrays, matches, and lighters in your home, car, or work. Do not let people smoke in your home.  Review your past attempts to quit. Think about what worked and what did not. GET SUPPORT AND ENCOURAGEMENT You have a better chance of being successful if you have help. You can get support in many ways.  Tell your family, friends, and co-workers that you are going to quit and need their support. Ask them not to smoke around you.  Get individual, group, or telephone counseling and support. Programs are available at local hospitals and health centers. Call your local health department for   information about programs in your area.  Spiritual beliefs and practices may help some smokers quit.  Download a "quit meter" on your computer to keep track of quit statistics, such as how long you have gone without smoking, cigarettes not smoked, and money saved.  Get a self-help book about quitting smoking and staying off of tobacco. LEARN NEW SKILLS AND BEHAVIORS  Distract yourself from urges to smoke. Talk to someone, go for a walk, or occupy your time with a task.  Change your normal routine. Take a different route to work. Drink tea instead of coffee.  Eat breakfast in a different place.  Reduce your stress. Take a hot bath, exercise, or read a book.  Plan something enjoyable to do every day. Reward yourself for not smoking.  Explore interactive web-based programs that specialize in helping you quit. GET MEDICINE AND USE IT CORRECTLY Medicines can help you stop smoking and decrease the urge to smoke. Combining medicine with the above behavioral methods and support can greatly increase your chances of successfully quitting smoking.  Nicotine replacement therapy helps deliver nicotine to your body without the negative effects and risks of smoking. Nicotine replacement therapy includes nicotine gum, lozenges, inhalers, nasal sprays, and skin patches. Some may be available over-the-counter and others require a prescription.  Antidepressant medicine helps people abstain from smoking, but how this works is unknown. This medicine is available by prescription.  Nicotinic receptor partial agonist medicine simulates the effect of nicotine in your brain. This medicine is available by prescription. Ask your caregiver for advice about which medicines to use and how to use them based on your health history. Your caregiver will tell you what side effects to look out for if you choose to be on a medicine or therapy. Carefully read the information on the package. Do not use any other product containing nicotine while using a nicotine replacement product.  RELAPSE OR DIFFICULT SITUATIONS Most relapses occur within the first 3 months after quitting. Do not be discouraged if you start smoking again. Remember, most people try several times before finally quitting. You may have symptoms of withdrawal because your body is used to nicotine. You may crave cigarettes, be irritable, feel very hungry, cough often, get headaches, or have difficulty concentrating. The withdrawal symptoms are only temporary. They are strongest when you first quit, but they will go away within  10 14 days. To reduce the chances of relapse, try to:  Avoid drinking alcohol. Drinking lowers your chances of successfully quitting.  Reduce the amount of caffeine you consume. Once you quit smoking, the amount of caffeine in your body increases and can give you symptoms, such as a rapid heartbeat, sweating, and anxiety.  Avoid smokers because they can make you want to smoke.  Do not let weight gain distract you. Many smokers will gain weight when they quit, usually less than 10 pounds. Eat a healthy diet and stay active. You can always lose the weight gained after you quit.  Find ways to improve your mood other than smoking. FOR MORE INFORMATION  www.smokefree.gov  Document Released: 02/07/2001 Document Revised: 08/15/2011 Document Reviewed: 05/25/2011 Exit[employer] Patient Information 2014 [employer].  

## 2013-03-24 NOTE — Progress Notes (Signed)
[facility]  Telephone:(336) [phone] Fax:(336) [phone]   OFFICE PROGRESS NOTE   Cc:  Noralee Space, MD  [facility] [address]  [place] [address]  Dr. Excell Seltzer  Dr. Evette Cristal  Dr. Eppie Gibson   DIAGNOSIS: 58 year old female who originally was seen in the multidisciplinary breast clinic for a stage I invasive ductal carcinoma of the left breast found on a screening mammogram.   PRIOR THERAPY:   #1 patient originally seen in the multidisciplinary breast clinic after needle core biopsy that showed a left invasive ductal carcinoma, grade 1 ER positive PR positive HER-2/neu negative with Ki-67 of 6%.   #2 patient has now gone on to have a lumpectomy of the left breast with sentinel node biopsy performed on 06/20/2011.   #3 the final pathology revealed a 1.0 cm invasive ductal carcinoma grade 1 ER positive PR positive HER-2/neu negative with Ki-67 of 6%.   #4 patient is currently receiving radiation therapy from 08/17/2011 , completed on 09/28/11  #5 she will begun adjuvant antiestrogen therapy consisting of Arimidex 1 mg daily Beginning October 02, 2011. Patient had developed significant bilateral carpal tunnel syndrome type of pain. arimidex discontinued for 8 weeks  CURRENT THERAPY: Arimidex 1 mg daily (hold)   INTERVAL HISTORY:  MICHELENE KENISTON 58 y.o. female returns for followup visit today. She is complaining of bilateral hand pain and describes it as carpal tunnel type of pain. She thinks this is coming from the Arimidex. She also has gained significant amount of weight. She is having left hip pain as well again contributing to the medication. She denies any headaches double vision blurring of vision no fevers chills or night sweats. Patient is continuing to smoke. She had quit for 4 weeks but then restarted. We discussed smoking cessation today. She has not had any vaginal discharge. Remainder of the template review of systems  is negative.   Past Medical History  Diagnosis Date  . History of bronchitis   . History of sarcoidosis   . Cigarette smoker   . Hyperlipidemia   . Diverticulosis of colon   . Colon polyp   . DJD (degenerative joint disease)   . Vitamin D deficiency   . Panic disorder   . Depression   . History of anemia   . Complication of anesthesia     DIFFICULTY AWAKENING  . Headache(784.0)   . Incisional breast wound     AT AGE 50 BILATERAL INCISION TO BREAST MADE  . Cancer     BREAST - left  . 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 Anxiety     panic disorder  . GERD (gastroesophageal reflux disease)     Pt. denies having GERD. Unable to remove it.  Marland Kitchen DJD (degenerative joint disease)   . Allergy   . Anemia     hx  . Leg swelling   . S/P radiation therapy 08/17/11 - 09/28/11    LLQ - 50 Gy/25 Fractions with Boost of 10 Gy / 5 fractions    Past Surgical History  Procedure Laterality Date  . Total abdominal hysterectomy  2000    Dr Nori Riis  . Trigger finger release  2008    Dr Burney Gauze  . Bunionectomy  2011    bilateral by Dr Little Ishikawa  . Tonsillectomy  1972  . Cyst removed from right hand  1995  . Colonoscopy  polyp  . Breast surgery  07/05/11    left breast lumpectomy with needle loc & axillary sln bx  . Biopsy breast      left breast  as a teenager  benign    Current Outpatient Prescriptions  Medication Sig Dispense Refill  . ALPRAZolam (XANAX) 0.5 MG tablet Take 1/2 to 1 tablet by mouth three times daily as needed for anxiety  90 tablet  5  . anastrozole (ARIMIDEX) 1 MG tablet Take 1 tablet (1 mg total) by mouth daily.  30 tablet  5  . buPROPion (WELLBUTRIN XL) 150 MG 24 hr tablet Take 1 tablet (150 mg total) by mouth daily.  90 tablet  1  . CALCIUM PO Take 1 tablet by mouth 2 (two) times daily.      . cholecalciferol (VITAMIN D) 1000 UNITS tablet Take 1,000 Units by mouth daily.      . Clobetasol Propionate 0.05 %  lotion Apply to affected areas 2-times per day for 2 weeks  118 mL  1  . albuterol (PROVENTIL HFA;VENTOLIN HFA) 108 (90 BASE) MCG/ACT inhaler Inhale 1-2 puffs into the lungs every 6 (six) hours as needed for wheezing.  1 Inhaler  0  . fluticasone (FLOVENT HFA) 220 MCG/ACT inhaler Inhale 1 puff into the lungs 2 (two) times daily.  1 Inhaler  0  . sodium chloride (OCEAN NASAL SPRAY) 0.65 % nasal spray Place 1 spray into the nose as needed for congestion.  30 mL  12   No current facility-administered medications for this visit.    ALLERGIES:  is allergic to penicillins.  REVIEW OF SYSTEMS:  No respiratory or cardiac complaints.  The rest of the 14-point review of system was negative.   Filed Vitals:   03/24/13 1009  BP: 135/89  Pulse: 78  Temp: 98.3 F (36.8 C)  Resp: 18   Wt Readings from Last 3 Encounters:  03/24/13 231 lb 14.4 oz (105.189 kg)  09/19/12 227 lb 4.8 oz (103.103 kg)  06/17/12 223 lb 12.8 oz (101.515 kg)     PHYSICAL EXAMINATION:  BP 135/89  Pulse 78  Temp(Src) 98.3 F (36.8 C) (Oral)  Resp 18  Ht 5' 5" (1.651 m)  Wt 231 lb 14.4 oz (105.189 kg)  BMI 38.59 kg/m2  GENERAL: Well developed, well nourished, in no acute distress.  EENT: No ocular or oral lesions. No stomatitis.  Resp: clear to auscultation bilaterally and normal percussion bilaterally  Cardio: regular rate and rhythm, S1, S2 normal, no murmur, click, rub or gallop  GI: soft, non-tender; bowel sounds normal; no masses, no organomegaly  Extremities: extremities normal, atraumatic, no cyanosis or edema  Neurologic: Grossly normal  Left breast examination reveals a well incision scar without any other masses or nipple discharge right breast no masses or nipple discharge.   ECOG PERFORMANCE STATUS: 0 - Asymptomatic    LABORATORY/RADIOLOGY DATA:  Lab Results  Component Value Date   WBC 7.3 03/24/2013   HGB 12.6 03/24/2013   HCT 39.1 03/24/2013   PLT 221 03/24/2013   GLUCOSE 105 03/24/2013   CHOL  200 06/17/2012   TRIG 100.0 06/17/2012   HDL 64.90 06/17/2012   LDLDIRECT 118.9 12/18/2011   LDLCALC 115* 06/17/2012   ALKPHOS 121 03/24/2013   ALT 18 03/24/2013   AST 12 03/24/2013   NA 142 03/24/2013   K 4.0 03/24/2013   CL 105 06/17/2012   CREATININE 0.8 03/24/2013   BUN 10.8 03/24/2013   CO2 26 03/24/2013          ASSESSMENT AND PLAN:   1. Raeleen Y Glover 58 y.o. female with   #1 stage I invasive ductal carcinoma of the left breast status post lumpectomy the final pathology revealed a 1.0 cm ER/PR positive HER-2/neu negative grade 1 disease. Sentinel node was negative for metastatic disease.Patient completed her radiation therapy on 09/28/2011. She was then begun on adjuvant antiestrogen therapy with Arimidex 1 mg daily. she has no evidence of recurrent disease.  #2 myalgias and arthralgias/carpal tunnel possibly secondary to a remedy axilla we'll hold this for 6-8 weeks.  #3 weight gain: Discussed exercise and healthy diet.  PLAN:   #1 hold Arimidex for 6-8 weeks time.  #2 patient will return in 8 weeks for followup. If her aches and pains have gone then certainly will be the remote ask that we'll be discontinued permanently and we will start her on tamoxifen 20 mg daily. I discussed the risks and benefits and side effects and rationale. She consents.   All questions were answered. The patient knows to call the clinic with any problems, questions or concerns. We can certainly see the patient much sooner if necessary.   The length of time of the face-to-face encounter was 30    minutes. More than 50% of time was spent counseling and coordination of care.  Kalsoom Khan, MD Medical/Oncology [facility] [phone] (beeper) [phone] (Office)      

## 2013-04-07 ENCOUNTER — Other Ambulatory Visit: Payer: Self-pay | Admitting: Oncology

## 2013-04-07 DIAGNOSIS — Z853 Personal history of malignant neoplasm of breast: Secondary | ICD-10-CM

## 2013-04-07 DIAGNOSIS — Z9889 Other specified postprocedural states: Secondary | ICD-10-CM

## 2013-05-08 ENCOUNTER — Telehealth: Payer: Self-pay | Admitting: Pulmonary Disease

## 2013-05-08 NOTE — Telephone Encounter (Signed)
Called and spoke with pt and she will call [facility] to be set up with new primary care.

## 2013-05-19 ENCOUNTER — Telehealth: Payer: Self-pay | Admitting: Oncology

## 2013-05-19 NOTE — Telephone Encounter (Signed)
, °

## 2013-05-22 ENCOUNTER — Other Ambulatory Visit (HOSPITAL_BASED_OUTPATIENT_CLINIC_OR_DEPARTMENT_OTHER): Payer: PRIVATE HEALTH INSURANCE

## 2013-05-22 ENCOUNTER — Ambulatory Visit (HOSPITAL_BASED_OUTPATIENT_CLINIC_OR_DEPARTMENT_OTHER): Payer: PRIVATE HEALTH INSURANCE | Admitting: Hematology and Oncology

## 2013-05-22 ENCOUNTER — Telehealth: Payer: Self-pay

## 2013-05-22 ENCOUNTER — Other Ambulatory Visit: Payer: PRIVATE HEALTH INSURANCE

## 2013-05-22 ENCOUNTER — Telehealth: Payer: Self-pay | Admitting: Oncology

## 2013-05-22 ENCOUNTER — Ambulatory Visit: Payer: PRIVATE HEALTH INSURANCE | Admitting: Oncology

## 2013-05-22 VITALS — BP 132/83 | HR 92 | Temp 98.7°F | Resp 18 | Ht 65.0 in | Wt 234.7 lb

## 2013-05-22 DIAGNOSIS — C50519 Malignant neoplasm of lower-outer quadrant of unspecified female breast: Secondary | ICD-10-CM

## 2013-05-22 DIAGNOSIS — M2550 Pain in unspecified joint: Secondary | ICD-10-CM

## 2013-05-22 DIAGNOSIS — C50919 Malignant neoplasm of unspecified site of unspecified female breast: Secondary | ICD-10-CM

## 2013-05-22 DIAGNOSIS — R718 Other abnormality of red blood cells: Secondary | ICD-10-CM

## 2013-05-22 DIAGNOSIS — G5600 Carpal tunnel syndrome, unspecified upper limb: Secondary | ICD-10-CM

## 2013-05-22 DIAGNOSIS — R635 Abnormal weight gain: Secondary | ICD-10-CM

## 2013-05-22 DIAGNOSIS — Z170 Estrogen receptor positive status [ER+]: Secondary | ICD-10-CM

## 2013-05-22 DIAGNOSIS — IMO0001 Reserved for inherently not codable concepts without codable children: Secondary | ICD-10-CM

## 2013-05-22 LAB — CBC WITH DIFFERENTIAL/PLATELET
BASO%: 0.3 % (ref 0.0–2.0)
BASOS ABS: 0 10*3/uL (ref 0.0–0.1)
EOS ABS: 0.2 10*3/uL (ref 0.0–0.5)
EOS%: 2 % (ref 0.0–7.0)
HCT: 38.3 % (ref 34.8–46.6)
HEMOGLOBIN: 12.1 g/dL (ref 11.6–15.9)
LYMPH#: 2 10*3/uL (ref 0.9–3.3)
LYMPH%: 24 % (ref 14.0–49.7)
MCH: 23.5 pg — ABNORMAL LOW (ref 25.1–34.0)
MCHC: 31.5 g/dL (ref 31.5–36.0)
MCV: 74.7 fL — AB (ref 79.5–101.0)
MONO#: 0.8 10*3/uL (ref 0.1–0.9)
MONO%: 9.2 % (ref 0.0–14.0)
NEUT%: 64.5 % (ref 38.4–76.8)
NEUTROS ABS: 5.3 10*3/uL (ref 1.5–6.5)
Platelets: 222 10*3/uL (ref 145–400)
RBC: 5.13 10*6/uL (ref 3.70–5.45)
RDW: 14.7 % — AB (ref 11.2–14.5)
WBC: 8.2 10*3/uL (ref 3.9–10.3)

## 2013-05-22 LAB — COMPREHENSIVE METABOLIC PANEL (CC13)
ALBUMIN: 3.5 g/dL (ref 3.5–5.0)
ALT: 15 U/L (ref 0–55)
AST: 11 U/L (ref 5–34)
Alkaline Phosphatase: 126 U/L (ref 40–150)
Anion Gap: 9 mEq/L (ref 3–11)
BUN: 11.1 mg/dL (ref 7.0–26.0)
CALCIUM: 9.9 mg/dL (ref 8.4–10.4)
CHLORIDE: 108 meq/L (ref 98–109)
CO2: 23 mEq/L (ref 22–29)
Creatinine: 1 mg/dL (ref 0.6–1.1)
GLUCOSE: 87 mg/dL (ref 70–140)
POTASSIUM: 4.3 meq/L (ref 3.5–5.1)
Sodium: 141 mEq/L (ref 136–145)
Total Bilirubin: 0.33 mg/dL (ref 0.20–1.20)
Total Protein: 7 g/dL (ref 6.4–8.3)

## 2013-05-22 MED ORDER — LETROZOLE 2.5 MG PO TABS: 2.5000 mg | ORAL_TABLET | Freq: Every day | ORAL | Status: DC

## 2013-05-22 NOTE — Telephone Encounter (Signed)
, °

## 2013-05-22 NOTE — Telephone Encounter (Signed)
Called to retrieve Bone density report from [facility] for [facility]. Was informed they would fax over the report from April 2013, there latest bone Density report.

## 2013-05-23 ENCOUNTER — Encounter: Payer: Self-pay | Admitting: Hematology and Oncology

## 2013-05-23 ENCOUNTER — Other Ambulatory Visit: Payer: Self-pay | Admitting: Hematology and Oncology

## 2013-05-23 ENCOUNTER — Other Ambulatory Visit: Payer: Self-pay

## 2013-05-23 ENCOUNTER — Telehealth: Payer: Self-pay

## 2013-05-23 DIAGNOSIS — M8580 Other specified disorders of bone density and structure, unspecified site: Secondary | ICD-10-CM

## 2013-05-23 DIAGNOSIS — C50419 Malignant neoplasm of upper-outer quadrant of unspecified female breast: Secondary | ICD-10-CM

## 2013-05-23 NOTE — Telephone Encounter (Signed)
Called and informed patient [name] woulld like her to have a bone density scan. Patient stated her insurance wont pay for it unless it was after 2 years. Told patient that scheduling will be calling to set up an appointment after 2 year mark. Dexa was sent from PCP to Dr Owens Loffler earlier this week.  According to her note the Dexa was done sometime in April 2013 so POF was sent in to be scheduled after 06/27/13 so there are no problems with insurance coverage.  Patient verbalized understanding. POF filled out and sent to scheduling. Note sent to Dr Owens Loffler that an order needs to be placed.

## 2013-05-23 NOTE — Progress Notes (Addendum)
[name]  [phone] [phone]   OFFICE PROGRESS NOTE   Cc:  Noralee Space, MD  [facility] [address]  [place] [address]  Dr. Excell Seltzer  Dr. Evette Cristal  Dr. Eppie Gibson   DIAGNOSIS: 58 year old female who originally was seen in the multidisciplinary breast clinic for a stage I invasive ductal carcinoma of the left breast found on a screening mammogram.   PRIOR THERAPY:   #1 patient originally seen in the multidisciplinary breast clinic after needle core biopsy that showed a left invasive ductal carcinoma, grade 1 ER positive PR positive HER-2/neu negative with Ki-67 of 6%.   #2 patient has now gone on to have a lumpectomy of the left breast with sentinel node biopsy performed on 06/20/2011.   #3 the final pathology revealed a 1.0 cm invasive ductal carcinoma grade 1 ER positive PR positive HER-2/neu negative with Ki-67 of 6%.   #4 patient is currently receiving radiation therapy from 08/17/2011 , completed on 09/28/11  #5 she began adjuvant antiestrogen therapy consisting of Arimidex 1 mg daily October 02, 2011. Patient developed significant bilateral carpal tunnel syndrome type of pain. Arimidex discontinued for 8 weeks  CURRENT THERAPY: Arimidex 1 mg daily (hold)   INTERVAL HISTORY:  Marilyn [name] 58 y.o. female returns for followup visit today. She is not complaining of bilateral hand pain type symptoms suggestive of bilateral carpal tunnel syndrome since she discontinued Arimidex. She denies significant hot flashes and her appetite is fair.She denies any other bone or muscle problems. She is concerned of ? Lump under her left arm.She also reports intermittent ankle swelling.   Remainder of the template review of systems is negative.Patient continues to smoke but she states trying to quit.   Past Medical History  Diagnosis Date  . History of bronchitis   . History of sarcoidosis   .  Cigarette smoker   . Hyperlipidemia   . Diverticulosis of colon   . Colon polyp   . DJD (degenerative joint disease)   . Vitamin D deficiency   . Panic disorder   . Depression   . History of anemia   . Complication of anesthesia     DIFFICULTY AWAKENING  . Headache(784.0)   . Incisional breast wound     AT AGE 69 BILATERAL INCISION TO BREAST MADE  . Cancer     BREAST - left  . 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 Anxiety     panic disorder  . GERD (gastroesophageal reflux disease)     Pt. denies having GERD. Unable to remove it.  Marland Kitchen DJD (degenerative joint disease)   . Allergy   . Anemia     hx  . Leg swelling   . S/P radiation therapy 08/17/11 - 09/28/11    LLQ - 50 Gy/25 Fractions with Boost of 10 Gy / 5 fractions    Past Surgical History  Procedure Laterality Date  . Total abdominal hysterectomy  2000    Dr Nori Riis  . Trigger finger release  2008    Dr Burney Gauze  . Bunionectomy  2011    bilateral by Dr Little Ishikawa  . Tonsillectomy  1972  . Cyst removed from right hand  1995  . Colonoscopy      polyp  . Breast surgery  07/05/11    left breast lumpectomy with needle loc & axillary sln bx  .  Biopsy breast      left breast  as a teenager  benign    Current Outpatient Prescriptions  Medication Sig Dispense Refill  . ALPRAZolam (XANAX) 0.5 MG tablet Take 1/2 to 1 tablet by mouth three times daily as needed for anxiety  90 tablet  5  . buPROPion (WELLBUTRIN XL) 150 MG 24 hr tablet Take 1 tablet (150 mg total) by mouth daily.  90 tablet  1  . CALCIUM PO Take 1 tablet by mouth 2 (two) times daily.      . cholecalciferol (VITAMIN D) 1000 UNITS tablet Take 1,000 Units by mouth daily.      . Clobetasol Propionate 0.05 % lotion Apply to affected areas 2-times per day for 2 weeks  118 mL  1  . sodium chloride (OCEAN NASAL SPRAY) 0.65 % nasal spray Place 1 spray into the nose as needed for congestion.  30 mL  12  . albuterol  (PROVENTIL HFA;VENTOLIN HFA) 108 (90 BASE) MCG/ACT inhaler Inhale 1-2 puffs into the lungs every 6 (six) hours as needed for wheezing.  1 Inhaler  0  . fluticasone (FLOVENT HFA) 220 MCG/ACT inhaler Inhale 1 puff into the lungs 2 (two) times daily.  1 Inhaler  0  . letrozole (FEMARA) 2.5 MG tablet Take 1 tablet (2.5 mg total) by mouth daily.  30 tablet  11   No current facility-administered medications for this visit.    ALLERGIES:  is allergic to penicillins.  REVIEW OF SYSTEMS:  No respiratory or cardiac complaints.  The rest of the 14-point review of system was negative.   Filed Vitals:   05/22/13 1420  BP: 132/83  Pulse: 92  Temp: 98.7 F (37.1 C)  Resp: 18   Wt Readings from Last 3 Encounters:  05/22/13 234 lb 11.2 oz (106.459 kg)  03/24/13 231 lb 14.4 oz (105.189 kg)  09/19/12 227 lb 4.8 oz (103.103 kg)     PHYSICAL EXAMINATION:  BP 132/83  Pulse 92  Temp(Src) 98.7 F (37.1 C) (Oral)  Resp 18  Ht 5' 5"  (1.651 m)  Wt 234 lb 11.2 oz (106.459 kg)  BMI 39.06 kg/m2  GENERAL: Well developed, well nourished, in no acute distress.  EENT: No ocular or oral lesions. No stomatitis.  Resp: clear to auscultation bilaterally and normal percussion bilaterally  Cardio: regular rate and rhythm, S1, S2 normal, no murmur, click, rub or gallop  GI: soft, non-tender; bowel sounds normal; no masses, no organomegaly  Extremities: extremities normal, no  edema  Neurologic: Grossly normal  Left breast examination reveals a lumpectomy incision scar ,very fibrotic and tender without anymasses or nipple discharge right breast no masses or nipple discharge.   ECOG PERFORMANCE STATUS: 0 - Asymptomatic    LABORATORY/RADIOLOGY DATA:  Lab Results  Component Value Date   WBC 8.2 05/22/2013   HGB 12.1 05/22/2013   HCT 38.3 05/22/2013   PLT 222 05/22/2013   GLUCOSE 87 05/22/2013   CHOL 200 06/17/2012   TRIG 100.0 06/17/2012   HDL 64.90 06/17/2012   LDLDIRECT 118.9 12/18/2011   LDLCALC 115*  06/17/2012   ALKPHOS 126 05/22/2013   ALT 15 05/22/2013   AST 11 05/22/2013   NA 141 05/22/2013   K 4.3 05/22/2013   CL 105 06/17/2012   CREATININE 1.0 05/22/2013   BUN 11.1 05/22/2013   CO2 23 05/22/2013        ASSESSMENT AND PLAN:   1. Marilyn [name] 58 y.o. female with   #1 stage I  invasive ductal carcinoma of the left breast status post lumpectomy the final pathology revealed a 1.0 cm ER/PR positive HER-2/neu negative grade 1 disease. Sentinel node was negative for metastatic disease.Patient completed her radiation therapy on 09/28/2011. She was then begun on adjuvant antiestrogen therapy with Arimidex 1 mg daily. She had no evidence of recurrent disease.  #2 myalgias and arthralgias/carpal tunnel possibly secondary to Arimidex held this for 8 weeks.  #3 weight gain PLAN:   #Patient off Arimidex and symptoms improved. Discussed with patient and willing to try first Letrozole first,since although similar to Arimidex sometimes patients can tolerate the one better than the other.If her symptoms recur then will switch to tamoxifen on follow up.Prescription given for Letrozole 2.5 mg one daily. Also to take Calcium 600 mg 2 daily and vit D 5000 IU 3 times per week.  Mammogram is scheduled for 05/2013.  Will get her Bone Density report.  Follow up in 6 months or as needed sooner if cannot tolerate Femara. Will check CBC,CMP,Vit D   Persistently low MCV without anemia will check ferritin,consider  Hgb electropheresis if normal.   All questions were answered. The patient knows to call the clinic with any problems, questions or concerns. We can certainly see the patient much sooner if necessary.   The length of time of the face-to-face encounter was 30    minutes. More than 50% of time was spent counseling and coordination of care.   Amada Kingfisher MD Oncology/Hematology  05/23/2013   Bone Density from [facility] indicates osteopenia T score -1.1  On 05/29/2011    Will schedule for bone Density now.

## 2013-05-27 ENCOUNTER — Telehealth: Payer: Self-pay | Admitting: Oncology

## 2013-05-27 NOTE — Telephone Encounter (Signed)
S/W PT TODAY RE APPT FOR DEXA/DR NEIL 07/01/13 @ 10:30AM. PER ASHLEY @ [facility] SHE WOULD HAVE TO SCHEDULE APPT FOR DR NEIL ALONG W/DEXA SCAN - PT AWARE. ALSO PER ASHLEY WHEN PT LAST HAD DEXA 06/12/13 IT WAS RECOMMENDED THAT SHE REPEAT TEST IN 3 YRS NOT 37YRS SO INS MAY NOT PAY - PT AWARE AND WILL CHECK W/INJ. CONFIRMED OCT APPTS [facility].

## 2013-06-05 ENCOUNTER — Other Ambulatory Visit: Payer: Self-pay | Admitting: Oncology

## 2013-06-05 DIAGNOSIS — C50919 Malignant neoplasm of unspecified site of unspecified female breast: Secondary | ICD-10-CM

## 2013-06-09 ENCOUNTER — Other Ambulatory Visit: Payer: Self-pay | Admitting: Oncology

## 2013-06-09 ENCOUNTER — Ambulatory Visit
Admission: RE | Admit: 2013-06-09 | Discharge: 2013-06-09 | Disposition: A | Discharge status: Home / Self Care | Payer: PRIVATE HEALTH INSURANCE | Source: Ambulatory Visit | Attending: Oncology | Admitting: Oncology

## 2013-06-09 DIAGNOSIS — Z853 Personal history of malignant neoplasm of breast: Secondary | ICD-10-CM

## 2013-06-09 DIAGNOSIS — R2230 Localized swelling, mass and lump, unspecified upper limb: Secondary | ICD-10-CM

## 2013-06-09 DIAGNOSIS — Z9889 Other specified postprocedural states: Secondary | ICD-10-CM

## 2013-06-11 ENCOUNTER — Telehealth: Payer: Self-pay

## 2013-06-11 NOTE — Telephone Encounter (Signed)
Pt called requesting to change back to Arimidex.  Route to [place]

## 2013-06-11 NOTE — Telephone Encounter (Signed)
Ok to go back to arimidex 1 mg daily #90/12 refills

## 2013-06-12 MED ORDER — ANASTROZOLE 1 MG PO TABS: 1.0000 mg | ORAL_TABLET | Freq: Every day | ORAL | Status: DC

## 2013-06-12 NOTE — Telephone Encounter (Signed)
Per KK note below, let pt know it was ok to go back on arimidex.  Pt requests refill - ordered.  Pt voiced understanding.

## 2013-06-12 NOTE — Addendum Note (Signed)
Addended by: Jonelle Sports K on: 06/12/2013 11:09 AM   Modules accepted: Orders

## 2013-06-19 ENCOUNTER — Encounter: Payer: Self-pay | Admitting: *Deleted

## 2013-06-19 NOTE — [facility] Oncology Navigator Note (Signed)
I called patient to check in.  Patient reports that she is doing well.  She has not restarted taking the Arimidex but plans to begin taking it at the beginning of next month.  She says that she is still a little skeptical about taking it because she is afraid she will have similar bone and muscle pain as before.  She felt her carpal tunnel symptoms returned before as a result of the medication.  She verbalized that she will give it another try  And see if she can tolerate the medication.  She denied any other questions or concerns at this time.  I encouraged her to call me for any needs.

## 2013-06-23 ENCOUNTER — Ambulatory Visit (INDEPENDENT_AMBULATORY_CARE_PROVIDER_SITE_OTHER)
Admission: RE | Admit: 2013-06-23 | Discharge: 2013-06-23 | Disposition: A | Discharge status: Home / Self Care | Payer: PRIVATE HEALTH INSURANCE | Source: Ambulatory Visit | Attending: Pulmonary Disease | Admitting: Pulmonary Disease

## 2013-06-23 ENCOUNTER — Encounter: Payer: Self-pay | Admitting: Pulmonary Disease

## 2013-06-23 ENCOUNTER — Ambulatory Visit (INDEPENDENT_AMBULATORY_CARE_PROVIDER_SITE_OTHER): Payer: PRIVATE HEALTH INSURANCE | Admitting: Pulmonary Disease

## 2013-06-23 VITALS — BP 126/80 | HR 91 | Temp 98.7°F | Ht 65.0 in | Wt 238.2 lb

## 2013-06-23 DIAGNOSIS — J45909 Unspecified asthma, uncomplicated: Secondary | ICD-10-CM

## 2013-06-23 DIAGNOSIS — D869 Sarcoidosis, unspecified: Secondary | ICD-10-CM

## 2013-06-23 DIAGNOSIS — F17200 Nicotine dependence, unspecified, uncomplicated: Secondary | ICD-10-CM

## 2013-06-23 DIAGNOSIS — I872 Venous insufficiency (chronic) (peripheral): Secondary | ICD-10-CM

## 2013-06-23 DIAGNOSIS — C50919 Malignant neoplasm of unspecified site of unspecified female breast: Secondary | ICD-10-CM

## 2013-06-23 DIAGNOSIS — K219 Gastro-esophageal reflux disease without esophagitis: Secondary | ICD-10-CM

## 2013-06-23 NOTE — Patient Instructions (Signed)
Today we updated your med list in our EPIC system...    Continue your current medications the same...  Today we did a Breathing test and a follow up CXR...    We will contact you w/ the results when available...   Marilyn [name], you need to quit the last of those nasty cigarettes!!!    Try a nicotine replacement product like the patches, gum, or lozenges (avail OTC)...  For the cough>>    Try the OTC cough syrup- DELSYM- take 2 tsp twice daily as needed...  We also discussed an ANTIREFLUX regimen>>    Take Prilosec or Nexium prior to the evening meal...    Eat early if poss ~5-6pm & don't eat or drink much after dinner to allow your stomach to empty thoroughly...    Elevate the head of your bed on 6" blocks...  Call for any questions or if i can be of service in any way...  Let's continue our yearly sarcoid follow up but call any time for breathing problems.Marland KitchenMarland Kitchen

## 2013-06-24 ENCOUNTER — Encounter: Payer: PRIVATE HEALTH INSURANCE | Admitting: Physician Assistant

## 2013-06-25 ENCOUNTER — Encounter: Payer: PRIVATE HEALTH INSURANCE | Admitting: Internal Medicine

## 2013-06-25 ENCOUNTER — Telehealth: Payer: Self-pay | Admitting: Pulmonary Disease

## 2013-06-25 NOTE — Telephone Encounter (Signed)
Called and spoke with pt and she is aware of cxr results per SN.  Pt voiced her understanding and nothing further is needed. 

## 2013-06-26 ENCOUNTER — Encounter: Payer: Self-pay | Admitting: Pulmonary Disease

## 2013-06-26 NOTE — Progress Notes (Signed)
Subjective:    Patient ID: Marilyn [name], female    DOB: 1956/02/08, 58 y.o.   MRN: [id number]  HPI 58 y/o BF here for a follow up visit... she has multiple medical problems as noted below...   ~  December 18, 2011:  546moROV & MAllinais essent stable- notes tired, no energy, incr weight, some DOE, she is s/p 30 XRT treatments from [name], now on Arimidex 172mper DrJanice Norrie.  We reviewed the following medical problems during today's OV>>     Bronchitis, Sarcoid, Smoker> still smoking 1/2-1ppd; notes mild cough, sm amt beige phlegm, no CP, +Sob/Doe    VI> trace edema; she knows to elim sodium, elev legs, wear support hose, not on diuretics as yet...    Chol> on diet alone; FLP shows TChol 214, TG 114, HDL 60, LDL 119    Overweight> she increased from 160#=> 229# due to Zoloft rx she says, then exac by foot surg & immobility...    GI- GERD, Divertics, IBS, Polyp, +FamHx> she continues to do well 7 denies abd pain, n/v, d/c, blood seen, etc...    Breast Cancer> on Arimidex per [name]...     DJD, VitD> off Tramadol, on OTC meds as needed; she notes some left hand paresthesias- rec wrist splint...    Headaches> no recent problems, she uses OTC meds as needed...    Anxiety, Panic> on Alpraz0.90m49mrn...    Anemia> CBC showed Hg 11.8=> 12.2 We reviewed prob list, meds, xrays and labs> see below for updates >>   CXR 10/13 showed normal heart size, atherosclerotic calcif, clear lungs, surg clips within the left breast...  LABS 10/13:  FLP- on diet alone w/ TChol=214 LDL 119;  Chems- wnl;  CBC- ok w/ Hg=12.2 MCV=75 & rec to start on Fe;  TSH=1.65;  VitD=26  ~  March 25, 2012:  46mo80mo & add-on for cough> several wk hx cough, thick sput, w/o hemoptysis; congested, sl wheezy, feeling bad- went to ER & given ZPak, antihist, saline, cough syrup, only min improved; still smoking 1/2-1ppd; Last CXR 10/13 was clear, NAD; Exam showed scat rhonchi & cough; we decided to treat w/ Depo & Pred taper...     Bronchitis, Sarcoid, Smoker> not on regular inhalers, she declines meds to aid smoking cessation...     Breast cancer> she had f/u Oncology 1/14- s/p left lumpectomy & sentinel node bx 4/13, s/p XRT from 6/13-8/13, now on Arimidex since 8/13 (tol well); she developed some lymphedema- seen in clinic & placed on compression sleeve; she is participating in a [facility] re: skin changes after XRT...    We reviewed prob list, meds, xrays and labs> see below for updates >> she had the 2013 Flu vaccine 10/13...  ~  June 17, 2012:  46mo 58mo& Marilyn [name] had recent poison ivy rash treated at [facility]red, now better;  We reviewed the following medical problems during today's office visit >>     Bronchitis, Sarcoid, Smoker> still smoking 1/2-1ppd (tried under the tongue lozenge); on Flovent/ Proventil Bid, MucinexAllergy; notes mild cough, sm amt beige phlegm, no CP, +Sob/Doe w/o change & asked to incr exercise...    VI> trace edema; she knows to elim sodium, elev legs, wear support hose, not on diuretics as yet...    Chol> on diet alone; FLP 4/14 shows TChol 200, TG 100, HDL 65, LDL 115    Overweight> she increased from 160#=> 229# due to Zoloft rx she says, now down 5#  to 224#; needs better diet & incr exercise...    GI- GERD, Divertics, IBS, Polyp, +FamHx> she continues to do well & denies abd pain, n/v, d/c, blood seen, etc...    Breast Cancer> stage 1 invasive ductal carcinoma, s/p lumpectomy & sentinel node bx (neg) 4/13, followed by XRT 6-8/13, w/ [facility]79m[facility]...    DJD, VitD> off Tramadol, on OTC meds as needed; +Calcium/ VitD1000; she notes some left hand paresthesias- rec wrist splint...    Headaches> no recent problems, she uses OTC meds as needed...    Anxiety, Panic> on Alpraz0.514mprn & [facility]150 [facility] she is under a lot of stress...    Anemia> CBC showed Hg 11.8=> 12.2 We reviewed prob list, meds, xrays and labs> see below for updates >>   LABS  4/14:  FLP- ok on diet alone x LDL=115;  Chems- wnl;  CBC- ok w/ Hg=12.6 MCV=75;  TSH=1.40...  ~  June 23, 2013:  [place] some [place], nasal congestion, drainage, clear mucus; she is still smoking ~5cig/d she says & asked to quit completely; we discussed Mucinex, Delsym, Tessalon... She's had a lot of side effects from Arimidex (stiffness, "I feel old", wt gain) and felt better off this med- but now she is back on it per drJanice Norriet al...  We reviewed the following medical problems during today's office visit >>     Bronchitis, Sarcoid, Smoker> still smoking 1/4-1/2ppd, off inhalers, uses Mucinex prn; notes mild cough, sm amt clear phlegm, no CP, +Sob/Doe w/o change & asked to incr exercise, get wt down...    VI> trace edema; she knows to elim sodium, elev legs, wear support hose, not on diuretics as yet...    Chol> on diet alone; FLP 4/14 shows TChol 200, TG 100, HDL 65, LDL 115    Overweight> she increased from 160#=> 238# due to Zoloft & Arimidex she says; we reviewed diet, exercise, wt reduction strategies...    GI- GERD, Divertics, IBS, Polyp, +FamHx> she continues to do well & denies abd pain, n/v, d/c, blood seen, etc; last colon 2011 & f/u due [id number]...    Breast Cancer> stage 1 invasive ductal carcinoma, s/p lumpectomy & sentinel node bx (neg) 4/13, followed by XRT 6-8/13, w/ Arimidex1mg24mstarting 8/13 per [name] & enduring the side effects...    DJD, VitD> off Tramadol, on OTC meds as needed; +Calcium/ VitD1000; she notes some left hand paresthesias- rec wrist splint trial...    Headaches> no recent problems, she uses OTC meds as needed...    Anxiety, Panic> on Alpraz0.5mg 43m & WellbutrinXL150 ([facility] is under a lot of stress...    Anemia> CBCs in epic shows Hg ~12 range... We reviewed prob list, meds, xrays and labs> see below for updates >>   CXR 4/15 showed norm heart size, clear lungs, no adenopathy, NAD...  PFT 4/15 showed FVC=3.49 (127%), FEV1=2.45  (112%), %1sec=70, mid-flows=61%predicted...  LABS 1/15-3/15: reviewed...             Problem List:  PHYSICAL EXAMINATION (ICD-V70.0) - GYN= DrNeal who does her PAP smears, Mammograms (4/14- neg), and BMD... he also checked her VitD level and started VitD 50K weekly, then changed her to 2000 u daily... she is s/p hysterectomy in 2000... Note- she had an abd bruit investigated w/ an Abd MRA 6/08 and nothing found...  [facility]... Routine Mammogram 3/13 showed left breast  lesion leading to surg by DrHoxworth, XRT by [name], Oncology f/u w/ [name]...   Hx of BRONCHITIS (ICD-490) - no recent episodes... Min cough & clear sputum... ~  Baseline CXR is clear and WNL (no residuals from her sarcoidosis) ~  Spirometry 7/06 showed FVC= 3.77 (130%), FEV1= 2.71 (113%), %1sec=72, mid-flows=66%... ~  CXR 11/12 showed normal heart size, min scarring, otherw clear, NAD.Marland Kitchen.  ~  CXR 10/13 showed normal heart size, atherosclerotic calcif, clear lungs, surg clips within the left breast... ~  1/14: she presented w/ refractory cough/ AB- given Depo, Pred taper, & the usual OTC meds... ~  CXR 2/14 showed norm heart size, clear lungs, mild peribronch thickening, surg clips overlying left breast...  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 totally resolved on Pred therapy w/ normalization of CXR and no recurrence off Pred now since 1992... ~  CXR 9/10 remains clear & WNL.Marland Kitchen. ~  CXR 11/11 remains clear & WNL.Marland Kitchen. ~  CXR 11/12 remains clear, mild basilar atx, otherw WNL... ~  EKG 11/12 showed NSR, rate62, wnl, NAD.Marland Kitchen. ~  CXR 10/13 showed normal heart size, atherosclerotic calcif, clear lungs, surg clips within the left breast... ~  CXR 2/14 showed norm heart size, clear lungs, mild peribronch thickening, surg clips overlying left breast... ~  CXR 4/15 showed norm heart size, clear lungs, no  adenopathy, NAD... ~  PFT 4/15 showed FVC=3.49 (127%), FEV1=2.45 (112%), %1sec=70, mid-flows=61%predicted...  CIGARETTE SMOKER (ICD-305.1) - she was smoking 1/2 ppd> states she quit 10/12 & now back to 1/2-1ppd!!! ~  4/15: she admits to still smoking 1/4ppd & encouraged to quit completely...  VENOUS INSUFFICIENCY (ICD-459.81) - she follows a low salt diet, elevates when nec, and wears support hose Prn.  HYPERLIPIDEMIA (ICD-272.4) - hx of hyperlipidemia w/ high HDL's prev... ~  [place] 7/07 showed TChol 210, TG 56, HDL 81, LDL 101 ~  FLP 6/08 showed TChol 232, TG 88, HDL 83, LDL 112 ~  FLP 8/09 showed TChol 199, TG 85, HDL 68, LDL 115 ~  FLP 9/10 (wt=175#) showed TChol 231, TG 78, HDL 94, LDL 120 ~  FLP 11/11 (wt=197#) showed TChol 265, TG 93, HDL 89, LDL 143... needs better diet get wt down. ~  FLP 11/12 (wt=210#) showed TChol 238, TG 79, HDL 83, LDL 148... Offered meds but she wants to see how she does on her wt loss program. ~  FLP 3/13 (wt=229#) showed TChol 235, TG 89, HDL 85, LDL 130 ~  FLP 10/13 (wt=229#) showed TChol 214, TG 114, HDL 60, LDL 119  ~  FLP 4/14 (wt=224#) showed TChol 200, TG 100, HDL 65, LDL 115  GERD (ICD-530.81) - uses OTC Prilosec as needed.  DIVERTICULOSIS OF COLON (ICD-562.10) IRRITABLE BOWEL SYNDROME (ICD-564.1) COLONIC POLYPS (ICD-211.3) Family Hx of COLON CANCER (ICD-153.9) -  ~  colonoscopy 9/06 by Demetra Shiner was WNL- no divertics, no polyps... f/u planned 3yr. ~  Father was diagnosed w/ colon cancer in 2009 & being treated by oncology... pt will need colon f/u in 2011. ~  f/u colonoscopy 9/11 by [name] showed divertics & 2 polyps= polypoid mucosa, f/u planned 543yr  Hx of UTI (ICD-599.0)  BREAST CANCER >> Routine mammogram 3/13 showed a lesion on the left breast >> See above:  Surg- [name],  XRT- [name],  Oncology- [name]... 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 ~  1/14: she had f/u [facility]> stage 1 invasive ductal carcinoma, s/p lumpectomy & sentinel node bx (neg) 4/13, followed by XRT 6-8/13, w/ Arimidex64m/d starting 8/13; doing satis & no changes made... ~  2/14: she had ROV [name]> doing satis, no problems... ~  1/15: she had f/u [name]> note reviewed, she was c/o some CTS & hip pain she believed was due to the Arimidex, they decided to HOLD it for 6-8wks & consider other med (eg-Femara/Tamoxifen), she felt better off the med but ultimately went back on it...  ~  Mammogram & Sonar 4/15 were neg- no evid of malignancy w/ post op changes noted...  DEGENERATIVE JOINT DISEASE (ICD-715.90) - she has mild DJD esp in knees & prev had cortisone shots from ortho- DrMcKinley... not on regular meds, she uses Tylenol Prn... ~  Oncology reported a BMD done by her GYN 4/13 w/ TScore -1.1 in left FemNeck... ~  4/15: follow up BMD has been ordered by Oncology, but not yet performed...  VITAMIN D DEFICIENCY (ICD-268.9) - followed by DrNeal & currently taking Vit D 1000 u caps- 2 daily. ~  Labs here 11/12 showed Vit D level = 37 ~  Labs 10/13 showed Vit D level = 26 ~  Labs 1/14 showed Vit D level = 44  HEADACHE (ICD-784.0)  Hx Panic Disorder> Improved w/o attacks in several yrs; she has ALPRAZOLAM0.573mfor prn use but seldom needs it she says... Hx of DEPRESSION (ICD-311) - husb passed away in 2003/26/05. she sees DrMcKinney (MGafferon ZOLOFT  10042m...  Hx of ANEMIA (ICD-285.9) - Hg chronically in the 11-12 range... ~  labs 9/10 showed Hg= 11.9, MCV= 77, Fe= 76 (21%sat) ~  labs 11/11 showed Hg= 12.0, MCV= 76, Fe= 80 (20%sat) ~  Labs 11/12 showed Hg= 12.1, MCV= 75, Fe=68 (19%sat) ~  Labs 4/13 showed Hg= 11.2 - 12.6 ~  Labs 10/13 showed Hg= 12.2, MCV= 75, Rec to start Fe daily... ~  Labs 4/14 showed Hg= 12.4, MCV= 75, & rec to restart thew FeSO4 daily...   Past Surgical History  Procedure Laterality Date  . Total abdominal hysterectomy  200March 25, 2000 Dr NeaNori Riis Trigger finger release  20003/25/2008 Dr WeiBurney Gauze Bunionectomy  2012011-03-26 bilateral by Dr TucLittle Ishikawa Tonsillectomy  1972  . Cyst removed from right hand  1995  . Colonoscopy      polyp  . Breast surgery  07/05/11    left breast lumpectomy with needle loc & axillary sln bx  . Biopsy breast      left breast  as a teenager  benign    Outpatient Encounter Prescriptions as of 06/23/2013  Medication Sig  . ALPRAZolam (XANAX) 0.5 MG tablet Take 1/2 to 1 tablet by mouth three times daily as needed for anxiety  . anastrozole (ARIMIDEX) 1 MG tablet Take 1 tablet (1 mg total) by mouth daily.  . bMarland KitchenPROPion (  WELLBUTRIN XL) 150 MG 24 hr tablet TAKE 1 TABLET (150 MG TOTAL) BY MOUTH DAILY.  Marland Kitchen CALCIUM PO Take 1 tablet by mouth 2 (two) times daily.  . cholecalciferol (VITAMIN D) 1000 UNITS tablet Take 1,000 Units by mouth daily.  . Clobetasol Propionate 0.05 % lotion Apply to affected areas 2-times per day for 2 weeks  . [DISCONTINUED] albuterol (PROVENTIL HFA;VENTOLIN HFA) 108 (90 BASE) MCG/ACT inhaler Inhale 1-2 puffs into the lungs every 6 (six) hours as needed for wheezing.  . [DISCONTINUED] fluticasone (FLOVENT HFA) 220 MCG/ACT inhaler Inhale 1 puff into the lungs 2 (two) times daily.  . [DISCONTINUED] letrozole (FEMARA) 2.5 MG tablet Take 1 tablet (2.5 mg total) by mouth daily.  . [DISCONTINUED] sodium chloride (OCEAN NASAL SPRAY) 0.65 % nasal spray Place 1 spray into the nose as  needed for congestion.    Allergies  Allergen Reactions  . Penicillins Rash    At injection site.    Current Medications, Allergies, Past Medical History, Past Surgical History, Family History, and Social History were reviewed in [employer] record.    Review of Systems    The patient notes some DOE- improved from last yr.  She denies fever, chills, sweats, anorexia, fatigue, weakness, malaise, weight loss, sleep disorder, blurring, diplopia, eye irritation, eye discharge, vision loss, eye pain, photophobia, earache, ear discharge, tinnitus, decreased hearing, nasal congestion, nosebleeds, sore throat, hoarseness, chest pain, palpitations, syncope, orthopnea, PND, peripheral edema, cough, dyspnea at rest, excessive sputum, hemoptysis, wheezing, pleurisy, nausea, vomiting, diarrhea, constipation, change in bowel habits, abdominal pain, melena, hematochezia, jaundice, gas/bloating, indigestion/heartburn, dysphagia, odynophagia, dysuria, hematuria, urinary frequency, urinary hesitancy, nocturia, incontinence, back pain, joint pain, joint swelling, muscle cramps, muscle weakness, stiffness, arthritis, sciatica, restless legs, leg pain at night, leg pain with exertion, rash, itching, dryness, suspicious lesions, paralysis, paresthesias, seizures, tremors, vertigo, transient blindness, frequent falls, frequent headaches, difficulty walking, depression, anxiety, memory loss, confusion, cold intolerance, heat intolerance, polydipsia, polyphagia, polyuria, unusual weight change, abnormal bruising, bleeding, enlarged lymph nodes, urticaria, allergic rash, hay fever, and recurrent infections.     Objective:   Physical Exam     WD, overweight, 58 y/o BF in NAD... GENERAL:  Alert & oriented; pleasant & cooperative... HEENT:  [place]/AT, EOM-wnl, PERRLA, EACs-clear, TMs-wnl, NOSE-clear, THROAT-clear & wnl. NECK:  Supple w/ full ROM; no JVD; normal carotid impulses w/o bruits; no  thyromegaly or nodules palpated; no lymphadenopathy. CHEST:  Clear to P & A; w/o wheezing, rales, or rhonchi heard... HEART:  Regular Rhythm; without murmurs/ rubs/ or gallops. ABDOMEN:  Soft & nontender; normal bowel sounds; no organomegaly or masses detected... EXT: without deformities, mild arthritic changes; no varicose veins/ venous insuffic/ or edema. NEURO:  CN's intact; motor testing normal; sensory testing normal; gait normal & balance OK. DERM:  No lesions noted; no rash etc...  RADIOLOGY DATA:  Reviewed in the EPIC EMR & discussed w/ the patient...  LABORATORY DATA:  Reviewed in the EPIC EMR & discussed w/ the patient...   Assessment & Plan:    Smoker, Hx Bronchits, Hx Sarcoid>  Hx of recurrent AB w/ refractory cough, congestion; she's had ZPak, Depo, Pred taper, Mucinex, Fluids & resolved; Sarcoid in remission, NEEDS TO QUIT SMOKING!!!  Ven Insuffic>  Aware- avoid sodium, elevate, support hose...  Hyperlipid>  She doesn't want meds but weight is up substantially & needs to get wt down...  GI> GERD, Divertics, IBS, Polyps>  Stable on Rx, followed by [name] & up to date...  BREAST CANCER>  followed by [name] on Arimidex...  DJD>  She uses OTC analgesics as needed; notes incr symptoms w/ wt gain & hoping for improvement on diet plan...  Hx Depression>  She was followed by psychiatrist prev on Zoloft & on prn Alpraz which she seldom takes + Wellbutrin started by [employer]...  Hx Anemia>  Hg stable ~12 w/ sm cells & Fe levels wnl; poss Thalassemia minor, it still wouldn't hurt to take Fe daily supplement...   Patient's Medications  New Prescriptions   No medications on file  Previous Medications   ALPRAZOLAM (XANAX) 0.5 MG TABLET    Take 1/2 to 1 tablet by mouth three times daily as needed for anxiety   ANASTROZOLE (ARIMIDEX) 1 MG TABLET    Take 1 tablet (1 mg total) by mouth daily.   BUPROPION (WELLBUTRIN XL) 150 MG 24 HR TABLET    TAKE 1 TABLET (150 MG TOTAL) BY MOUTH  DAILY.   CALCIUM PO    Take 1 tablet by mouth 2 (two) times daily.   CHOLECALCIFEROL (VITAMIN D) 1000 UNITS TABLET    Take 1,000 Units by mouth daily.   CLOBETASOL PROPIONATE 0.05 % LOTION    Apply to affected areas 2-times per day for 2 weeks  Modified Medications   No medications on file  Discontinued Medications   ALBUTEROL (PROVENTIL HFA;VENTOLIN HFA) 108 (90 BASE) MCG/ACT INHALER    Inhale 1-2 puffs into the lungs every 6 (six) hours as needed for wheezing.   FLUTICASONE (FLOVENT HFA) 220 MCG/ACT INHALER    Inhale 1 puff into the lungs 2 (two) times daily.   LETROZOLE (FEMARA) 2.5 MG TABLET    Take 1 tablet (2.5 mg total) by mouth daily.   SODIUM CHLORIDE (OCEAN NASAL SPRAY) 0.65 % NASAL SPRAY    Place 1 spray into the nose as needed for congestion.

## 2013-07-07 ENCOUNTER — Encounter: Payer: PRIVATE HEALTH INSURANCE | Admitting: Internal Medicine

## 2013-07-07 ENCOUNTER — Ambulatory Visit (INDEPENDENT_AMBULATORY_CARE_PROVIDER_SITE_OTHER): Payer: PRIVATE HEALTH INSURANCE | Admitting: Internal Medicine

## 2013-07-07 ENCOUNTER — Other Ambulatory Visit (INDEPENDENT_AMBULATORY_CARE_PROVIDER_SITE_OTHER): Payer: PRIVATE HEALTH INSURANCE

## 2013-07-07 ENCOUNTER — Encounter: Payer: Self-pay | Admitting: Internal Medicine

## 2013-07-07 VITALS — BP 140/72 | HR 88 | Temp 98.6°F | Resp 16 | Ht 65.0 in | Wt 236.0 lb

## 2013-07-07 DIAGNOSIS — Z0000 Encounter for general adult medical examination without abnormal findings: Secondary | ICD-10-CM

## 2013-07-07 DIAGNOSIS — R609 Edema, unspecified: Secondary | ICD-10-CM

## 2013-07-07 DIAGNOSIS — R202 Paresthesia of skin: Secondary | ICD-10-CM

## 2013-07-07 DIAGNOSIS — C50919 Malignant neoplasm of unspecified site of unspecified female breast: Secondary | ICD-10-CM

## 2013-07-07 DIAGNOSIS — E785 Hyperlipidemia, unspecified: Secondary | ICD-10-CM

## 2013-07-07 DIAGNOSIS — R209 Unspecified disturbances of skin sensation: Secondary | ICD-10-CM

## 2013-07-07 DIAGNOSIS — E559 Vitamin D deficiency, unspecified: Secondary | ICD-10-CM

## 2013-07-07 LAB — BASIC METABOLIC PANEL
BUN: 12 mg/dL (ref 6–23)
CALCIUM: 9.8 mg/dL (ref 8.4–10.5)
CHLORIDE: 105 meq/L (ref 96–112)
CO2: 27 mEq/L (ref 19–32)
Creatinine, Ser: 0.7 mg/dL (ref 0.4–1.2)
GFR: 106.5 mL/min (ref >60.00)
GLUCOSE: 85 mg/dL (ref 70–99)
Potassium: 4.2 mEq/L (ref 3.5–5.1)
Sodium: 139 mEq/L (ref 135–145)

## 2013-07-07 LAB — URINALYSIS, ROUTINE W REFLEX MICROSCOPIC
BILIRUBIN URINE: NEGATIVE
Ketones, ur: 15 — AB
Leukocytes, UA: NEGATIVE
NITRITE: NEGATIVE
TOTAL PROTEIN, URINE-UPE24: NEGATIVE
Urine Glucose: NEGATIVE
Urobilinogen, UA: 0.2 (ref 0.0–1.0)
pH: 6 (ref 5.0–8.0)

## 2013-07-07 LAB — LIPID PANEL
Cholesterol: 198 mg/dL (ref 0–200)
HDL: 58.8 mg/dL (ref >39.00)
LDL Cholesterol: 117 mg/dL — ABNORMAL HIGH (ref 0–99)
TRIGLYCERIDES: 111 mg/dL (ref 0.0–149.0)
Total CHOL/HDL Ratio: 3
VLDL: 22.2 mg/dL (ref 0.0–40.0)

## 2013-07-07 LAB — VITAMIN B12: Vitamin B-12: 717 pg/mL (ref 211–911)

## 2013-07-07 LAB — TSH: TSH: 1.02 u[IU]/mL (ref 0.35–4.50)

## 2013-07-07 MED ORDER — FUROSEMIDE 20 MG PO TABS: 20.0000 mg | ORAL_TABLET | Freq: Every day | ORAL | Status: DC | PRN

## 2013-07-07 NOTE — Assessment & Plan Note (Signed)
2013 XRTx30 No chemo

## 2013-07-07 NOTE — Progress Notes (Signed)
   Subjective:    Patient ID: Marilyn [name], female    DOB: [birth year], 58 y.o.   MRN: [id number]  HPI  Ne pt Dr Lenna Gilford  C/o ankle swelling, wt gain Side effects w/Arimidex - arthralgias, swellig  Wt Readings from Last 3 Encounters:  07/07/13 236 lb (107.049 kg)  06/23/13 238 lb 3.2 oz (108.047 kg)  05/22/13 234 lb 11.2 oz (106.459 kg)   BP Readings from Last 3 Encounters:  07/07/13 140/72  06/23/13 126/80  05/22/13 132/83       Review of Systems  Constitutional: Positive for unexpected weight change. Negative for chills, activity change, appetite change and fatigue.  HENT: Negative for congestion, mouth sores and sinus pressure.   Eyes: Negative for visual disturbance.  Respiratory: Negative for cough, chest tightness and wheezing.   Gastrointestinal: Negative for nausea and abdominal pain.  Genitourinary: Negative for frequency, difficulty urinating and vaginal pain.  Musculoskeletal: Negative for back pain, gait problem and myalgias.  Skin: Negative for pallor and rash.  Neurological: Negative for dizziness, tremors, weakness, numbness and headaches.  Psychiatric/Behavioral: Negative for suicidal ideas, confusion and sleep disturbance. The patient is nervous/anxious.        Objective:   Physical Exam  Constitutional: She appears well-developed. No distress.  HENT:  Head: Normocephalic.  Right Ear: External ear normal.  Left Ear: External ear normal.  Nose: Nose normal.  Mouth/Throat: Oropharynx is clear and moist.  Eyes: Conjunctivae are normal. Pupils are equal, round, and reactive to light. Right eye exhibits no discharge. Left eye exhibits no discharge.  Neck: Normal range of motion. Neck supple. No JVD present. No tracheal deviation present. No thyromegaly present.  Cardiovascular: Normal rate, regular rhythm and normal heart sounds.   Pulmonary/Chest: No stridor. No respiratory distress. She has no wheezes.  Abdominal: Soft. Bowel sounds are normal. She  exhibits no distension and no mass. There is no tenderness. There is no rebound and no guarding.  Musculoskeletal: She exhibits edema. She exhibits no tenderness.  Lymphadenopathy:    She has no cervical adenopathy.  Neurological: She displays normal reflexes. No cranial nerve deficit. She exhibits normal muscle tone. Coordination normal.  Skin: No rash noted. No erythema.  Psychiatric: She has a normal mood and affect. Her behavior is normal. Judgment and thought content normal.    No results found for this basename: GLUF         Assessment & Plan:

## 2013-07-07 NOTE — Assessment & Plan Note (Signed)
Labs

## 2013-07-07 NOTE — Patient Instructions (Signed)
Compression knee highs for travel 

## 2013-07-07 NOTE — Assessment & Plan Note (Signed)
Continue with current prescription therapy as reflected on the Med list.  

## 2013-07-07 NOTE — Progress Notes (Signed)
Pre visit review using our clinic review tool, if applicable. No additional management support is needed unless otherwise documented below in the visit note. 

## 2013-07-25 ENCOUNTER — Emergency Department (INDEPENDENT_AMBULATORY_CARE_PROVIDER_SITE_OTHER)
Admission: EM | Admit: 2013-07-25 | Discharge: 2013-07-25 | Disposition: A | Discharge status: Home / Self Care | Payer: PRIVATE HEALTH INSURANCE | Source: Home / Self Care

## 2013-07-25 ENCOUNTER — Encounter (HOSPITAL_COMMUNITY): Payer: Self-pay | Admitting: Emergency Medicine

## 2013-07-25 DIAGNOSIS — F341 Dysthymic disorder: Secondary | ICD-10-CM

## 2013-07-25 DIAGNOSIS — F418 Other specified anxiety disorders: Secondary | ICD-10-CM

## 2013-07-25 MED ORDER — CLONAZEPAM 0.5 MG PO TABS: 0.5000 mg | ORAL_TABLET | Freq: Two times a day (BID) | ORAL | Status: DC | PRN

## 2013-07-25 NOTE — Discharge Instructions (Signed)
Take the wellbutrin twice a day and anxiety medicine daily as prescribed until seen by your doctor for recheck.

## 2013-07-25 NOTE — ED Provider Notes (Signed)
CSN: [id number]     Arrival date & time 07/25/13  1245 History   None    Chief Complaint  Patient presents with  . Panic Attack   (Consider location/radiation/quality/duration/timing/severity/associated sxs/prior Treatment) Patient is a 58 y.o. female presenting with mental health disorder.  Mental Health Problem Presenting symptoms: depression   Degree of incapacity (severity):  Mild Onset quality:  Gradual Progression:  Worsening Chronicity:  Chronic Context comment:  Increasing freq of panic attacks recently. Associated symptoms: anxiety and chest pain   Risk factors: hx of mental illness     Past Medical History  Diagnosis Date  . History of bronchitis   . History of sarcoidosis   . Cigarette smoker   . Hyperlipidemia   . Diverticulosis of colon   . Colon polyp   . DJD (degenerative joint disease)   . Vitamin D deficiency   . Panic disorder   . Depression   . History of anemia   . Complication of anesthesia     DIFFICULTY AWAKENING  . Headache(784.0)   . Incisional breast wound     AT AGE 50 BILATERAL INCISION TO BREAST MADE  . Cancer     BREAST - left  . 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 Anxiety     panic disorder  . GERD (gastroesophageal reflux disease)     Pt. denies having GERD. Unable to remove it.  Marland Kitchen DJD (degenerative joint disease)   . Allergy   . Anemia     hx  . Leg swelling   . S/P radiation therapy 08/17/11 - 09/28/11    LLQ - 50 Gy/25 Fractions with Boost of 10 Gy / 5 fractions   Past Surgical History  Procedure Laterality Date  . Total abdominal hysterectomy  2000    Dr Nori Riis  . Trigger finger release  2008    Dr Burney Gauze  . Bunionectomy  2011    bilateral by Dr Little Ishikawa  . Tonsillectomy  1972  . Cyst removed from right hand  1995  . Colonoscopy      polyp  . Breast surgery  07/05/11    left breast lumpectomy with needle loc & axillary sln bx  . Biopsy breast      left breast   as a teenager  benign   Family History  Problem Relation Age of Onset  . Anemia Mother   . Other Mother     + H Pylori  . Prostate cancer Father   . Colon cancer Father     diagnosed 2009  . Cancer Father     colon and prostate cancer  . Breast cancer Sister     #1  . Cancer Sister 30    breast cancer  . Sarcoidosis Sister     #2  . Multiple sclerosis Sister     #2  . Hypertension Brother     #1  . Cancer Brother     prostate cancer  . Prostate cancer Brother     #1  . Hyperlipidemia Brother     #1  . Cancer Cousin     breast cancer   History  Substance Use Topics  . Smoking status: Current Every Day Smoker -- 0.25 packs/day    Types: Cigarettes    Last Attempt to Quit: 04/16/2012  . Smokeless tobacco: Former Systems developer    Quit date: 09/15/2011     Comment: trying to quit with wellbutrin  .  Alcohol Use: 0.0 oz/week    0-1 drink(s) per week     Comment: social   OB History   Grav Para Term Preterm Abortions TAB SAB Ect Mult Living                 Obstetric Comments   Menarche age 24, nulliparity, HRT x 1 year, Hysterectomy     Review of Systems  Constitutional: Negative.   Respiratory: Negative.   Cardiovascular: Positive for chest pain.  Psychiatric/Behavioral: The patient is nervous/anxious.     Allergies  Penicillins  Home Medications   Prior to Admission medications   Medication Sig Start Date End Date Taking? Authorizing Provider  ALPRAZolam Duanne Moron) 0.5 MG tablet Take 1/2 to 1 tablet by mouth three times daily as needed for anxiety 03/25/12   Noralee Space, MD  anastrozole (ARIMIDEX) 1 MG tablet Take 1 tablet (1 mg total) by mouth daily. 06/12/13   Deatra Robinson, MD  buPROPion (WELLBUTRIN XL) 150 MG 24 hr tablet TAKE 1 TABLET (150 MG TOTAL) BY MOUTH DAILY. 06/05/13   Deatra Robinson, MD  CALCIUM PO Take 1 tablet by mouth 2 (two) times daily.    Historical Provider, MD  cholecalciferol (VITAMIN D) 1000 UNITS tablet Take 1,000 Units by mouth daily.     Historical Provider, MD  Clobetasol Propionate 0.05 % lotion Apply to affected areas 2-times per day for 2 weeks 06/09/12   Rosana Hoes, MD  clonazePAM (KLONOPIN) 0.5 MG tablet Take 1 tablet (0.5 mg total) by mouth 2 (two) times daily as needed for anxiety. 07/25/13   Billy Fischer, MD  furosemide (LASIX) 20 MG tablet Take 1-2 tablets (20-40 mg total) by mouth daily as needed for edema. 07/07/13   Aleksei Plotnikov V, MD   BP 139/85  Pulse 97  Temp(Src) 98.1 F (36.7 C) (Oral)  Resp 14  SpO2 98% Physical Exam  Nursing note and vitals reviewed. Constitutional: She is oriented to person, place, and time. She appears well-developed and well-nourished. No distress.  Neck: Normal range of motion. Neck supple.  Cardiovascular: Normal heart sounds.   Pulmonary/Chest: Effort normal and breath sounds normal.  Lymphadenopathy:    She has no cervical adenopathy.  Neurological: She is alert and oriented to person, place, and time.  Skin: Skin is warm and dry.    ED Course  Procedures (including critical care time) Labs Review Labs Reviewed - No data to display  Imaging Review No results found.   MDM   1. Anxiety associated with depression        Billy Fischer, MD 07/27/13 [phone]

## 2013-07-25 NOTE — ED Notes (Signed)
Pt  Reports  She has  Breast  Cancer   She reports  She  Takes    meds  For  Same   She reports  Has  Been  Feeling  Anxious     She  denys  Hearing      Voices   She  denys  Any     Suicidal ideations  She reports  Pain under  Her l  Breast as  Well

## 2013-07-28 ENCOUNTER — Telehealth: Payer: Self-pay | Admitting: *Deleted

## 2013-07-28 NOTE — Telephone Encounter (Signed)
Pt came in to get a new appt d/t intolerance to Letrozole.  Scheduled pt with Mendel Ryder on 08/01/13 at 9:45.  Confirmed appt with pt.  Pt denies further needs at this time.

## 2013-07-30 ENCOUNTER — Other Ambulatory Visit: Payer: Self-pay | Admitting: *Deleted

## 2013-07-30 DIAGNOSIS — C50419 Malignant neoplasm of upper-outer quadrant of unspecified female breast: Secondary | ICD-10-CM

## 2013-07-30 DIAGNOSIS — E559 Vitamin D deficiency, unspecified: Secondary | ICD-10-CM

## 2013-07-30 DIAGNOSIS — C50919 Malignant neoplasm of unspecified site of unspecified female breast: Secondary | ICD-10-CM

## 2013-07-30 MED ORDER — CALCIUM 1200 1200-1000 MG-UNIT PO CHEW: 1200.0000 mg | CHEWABLE_TABLET | Freq: Every day | ORAL | Status: DC

## 2013-07-30 NOTE — Telephone Encounter (Signed)
Per Mendel Ryder, NP, I informed patient about her bone density results. Mendel Ryder recommends patient to continue taking Calcium 1200 mg daily, Vitamin D and weight bearing exercises. Patient verbalized understanding.

## 2013-08-01 ENCOUNTER — Telehealth: Payer: Self-pay

## 2013-08-01 ENCOUNTER — Telehealth: Payer: Self-pay | Admitting: Adult Health

## 2013-08-01 ENCOUNTER — Encounter: Payer: Self-pay | Admitting: Adult Health

## 2013-08-01 ENCOUNTER — Ambulatory Visit (HOSPITAL_BASED_OUTPATIENT_CLINIC_OR_DEPARTMENT_OTHER): Payer: PRIVATE HEALTH INSURANCE | Admitting: Adult Health

## 2013-08-01 VITALS — BP 141/86 | HR 75 | Temp 98.0°F | Resp 18 | Ht 65.0 in | Wt 239.8 lb

## 2013-08-01 DIAGNOSIS — IMO0001 Reserved for inherently not codable concepts without codable children: Secondary | ICD-10-CM

## 2013-08-01 DIAGNOSIS — C50919 Malignant neoplasm of unspecified site of unspecified female breast: Secondary | ICD-10-CM

## 2013-08-01 DIAGNOSIS — Z170 Estrogen receptor positive status [ER+]: Secondary | ICD-10-CM

## 2013-08-01 DIAGNOSIS — R635 Abnormal weight gain: Secondary | ICD-10-CM

## 2013-08-01 DIAGNOSIS — C50419 Malignant neoplasm of upper-outer quadrant of unspecified female breast: Secondary | ICD-10-CM

## 2013-08-01 DIAGNOSIS — M2550 Pain in unspecified joint: Secondary | ICD-10-CM

## 2013-08-01 NOTE — Telephone Encounter (Signed)
per pof to sch pt appt-gave pt copy of sch °

## 2013-08-01 NOTE — Telephone Encounter (Signed)
Received by fax bone density report from [facility] dtd 07/31/13.  Copy to [facility], original to scan.

## 2013-08-01 NOTE — Patient Instructions (Signed)
You are doing well today.  You have no sign of recurrence.  I recommend you stop smoking and continue healthy diet, exercise, and self breast exams.  We will see you back in 3 months for follow up.  Please call us if you have any questions or concerns.     Breast Self-Awareness Practicing breast self-awareness may pick up problems early, prevent significant medical complications, and possibly save your life. By practicing breast self-awareness, you can become familiar with how your breasts look and feel and if your breasts are changing. This allows you to notice changes early. It can also offer you some reassurance that your breast health is good. One way to learn what is normal for your breasts and whether your breasts are changing is to do a breast self-exam. If you find a lump or something that was not present in the past, it is best to contact your caregiver right away. Other findings that should be evaluated by your caregiver include nipple discharge, especially if it is bloody; skin changes or reddening; areas where the skin seems to be pulled in (retracted); or new lumps and bumps. Breast pain is seldom associated with cancer (malignancy), but should also be evaluated by a caregiver. HOW TO PERFORM A BREAST SELF-EXAM The best time to examine your breasts is 5 7 days after your menstrual period is over. During menstruation, the breasts are lumpier, and it may be more difficult to pick up changes. If you do not menstruate, have reached menopause, or had your uterus removed (hysterectomy), you should examine your breasts at regular intervals, such as monthly. If you are breastfeeding, examine your breasts after a feeding or after using a breast pump. Breast implants do not decrease the risk for lumps or tumors, so continue to perform breast self-exams as recommended. Talk to your caregiver about how to determine the difference between the implant and breast tissue. Also, talk about the amount of pressure  you should use during the exam. Over time, you will become more familiar with the variations of your breasts and more comfortable with the exam. A breast self-exam requires you to remove all your clothes above the waist. 1. Look at your breasts and nipples. Stand in front of a mirror in a room with good lighting. With your hands on your hips, push your hands firmly downward. Look for a difference in shape, contour, and size from one breast to the other (asymmetry). Asymmetry includes puckers, dips, or bumps. Also, look for skin changes, such as reddened or scaly areas on the breasts. Look for nipple changes, such as discharge, dimpling, repositioning, or redness. 2. Carefully feel your breasts. This is best done either in the shower or tub while using soapy water or when flat on your back. Place the arm (on the side of the breast you are examining) above your head. Use the pads (not the fingertips) of your three middle fingers on your opposite hand to feel your breasts. Start in the underarm area and use  inch (2 cm) overlapping circles to feel your breast. Use 3 different levels of pressure (light, medium, and firm pressure) at each circle before moving to the next circle. The light pressure is needed to feel the tissue closest to the skin. The medium pressure will help to feel breast tissue a little deeper, while the firm pressure is needed to feel the tissue close to the ribs. Continue the overlapping circles, moving downward over the breast until you feel your ribs below your  breast. Then, move one finger-width towards the center of the body. Continue to use the  inch (2 cm) overlapping circles to feel your breast as you move slowly up toward the collar bone (clavicle) near the base of the neck. Continue the up and down exam using all 3 pressures until you reach the middle of the chest. Do this with each breast, carefully feeling for lumps or changes. 3.  Keep a written record with breast changes or normal  findings for each breast. By writing this information down, you do not need to depend only on memory for size, tenderness, or location. Write down where you are in your menstrual cycle, if you are still menstruating. Breast tissue can have some lumps or thick tissue. However, see your caregiver if you find anything that concerns you.  SEEK MEDICAL CARE IF:  You see a change in shape, contour, or size of your breasts or nipples.   You see skin changes, such as reddened or scaly areas on the breasts or nipples.   You have an unusual discharge from your nipples.   You feel a new lump or unusually thick areas.  Document Released: 02/13/2005 Document Revised: 01/31/2012 Document Reviewed: 05/31/2011 [facility] Patient Information 2014 [place]. Tamoxifen oral tablet What is this medicine? TAMOXIFEN (ta MOX i fen) blocks the effects of estrogen. It is commonly used to treat breast cancer. It is also used to decrease the chance of breast cancer coming back in women who have received treatment for the disease. It may also help prevent breast cancer in women who have a high risk of developing breast cancer. This medicine may be used for other purposes; ask your health care provider or pharmacist if you have questions. COMMON BRAND NAME(S): Nolvadex What should I tell my health care provider before I take this medicine? They need to know if you have any of these conditions: -blood clots -blood disease -cataracts or impaired eyesight -endometriosis -high calcium levels -high cholesterol -irregular menstrual cycles -liver disease -stroke -uterine fibroids -an unusual or allergic reaction to tamoxifen, other medicines, foods, dyes, or preservatives -pregnant or trying to get pregnant -breast-feeding How should I use this medicine? Take this medicine by mouth with a glass of water. Follow the directions on the prescription label. You can take it with or without food. Take your medicine  at regular intervals. Do not take your medicine more often than directed. Do not stop taking except on your doctor's advice. A special MedGuide will be given to you by the pharmacist with each prescription and refill. Be sure to read this information carefully each time. Talk to your pediatrician regarding the use of this medicine in children. While this drug may be prescribed for selected conditions, precautions do apply. Overdosage: If you think you have taken too much of this medicine contact a poison control center or emergency room at once. NOTE: This medicine is only for you. Do not share this medicine with others. What if I miss a dose? If you miss a dose, take it as soon as you can. If it is almost time for your next dose, take only that dose. Do not take double or extra doses. What may interact with this medicine? -aminoglutethimide -bromocriptine -chemotherapy drugs -female hormones, like estrogens and birth control pills -letrozole -medroxyprogesterone -phenobarbital -rifampin -warfarin This list may not describe all possible interactions. Give your health care provider a list of all the medicines, herbs, non-prescription drugs, or dietary supplements you use. Also tell them if  you smoke, drink alcohol, or use illegal drugs. Some items may interact with your medicine. What should I watch for while using this medicine? Visit your doctor or health care professional for regular checks on your progress. You will need regular pelvic exams, breast exams, and mammograms. If you are taking this medicine to reduce your risk of getting breast cancer, you should know that this medicine does not prevent all types of breast cancer. If breast cancer or other problems occur, there is no guarantee that it will be found at an early stage. Do not become pregnant while taking this medicine or for 2 months after stopping this medicine. Stop taking this medicine if you get pregnant or think you are  pregnant and contact your doctor. This medicine may harm your unborn baby. Women who can possibly become pregnant should use birth control methods that do not use hormones during tamoxifen treatment and for 2 months after therapy has stopped. Talk with your health care provider for birth control advice. Do not breast feed while taking this medicine. What side effects may I notice from receiving this medicine? Side effects that you should report to your doctor or health care professional as soon as possible: -changes in vision (blurred vision) -changes in your menstrual cycle -difficulty breathing or shortness of breath -difficulty walking or talking -new breast lumps -numbness -pelvic pain or pressure -redness, blistering, peeling or loosening of the skin, including inside the mouth -skin rash or itching (hives) -sudden chest pain -swelling of lips, face, or tongue -swelling, pain or tenderness in your calf or leg -unusual bruising or bleeding -vaginal discharge that is bloody, brown, or rust -weakness -yellowing of the whites of the eyes or skin Side effects that usually do not require medical attention (report to your doctor or health care professional if they continue or are bothersome): -fatigue -hair loss, although uncommon and is usually mild -headache -hot flashes -impotence (in men) -nausea, vomiting (mild) -vaginal discharge (white or clear) This list may not describe all possible side effects. Call your doctor for medical advice about side effects. You may report side effects to FDA at 1-800-FDA-1088. Where should I keep my medicine? Keep out of the reach of children. Store at room temperature between 20 and 25 degrees C (68 and 77 degrees F). Protect from light. Keep container tightly closed. Throw away any unused medicine after the expiration date. NOTE: This sheet is a summary. It may not cover all possible information. If you have questions about this medicine, talk to  your doctor, pharmacist, or health care provider.  2014, Elsevier/Gold Standard. (2007-10-31 12:01:56)

## 2013-08-01 NOTE — Progress Notes (Signed)
[facility]  Telephone:([phone]) [phone] Fax:(336) [phone]   OFFICE PROGRESS NOTE   Cc:  Walker Kehr, MD  [facility] [place] [address] [place] [address]  Dr. Excell Seltzer  Dr. Evette Cristal  Dr. Eppie Gibson   DIAGNOSIS: 58 year old female who originally was seen in the multidisciplinary breast clinic for a stage I invasive ductal carcinoma of the left breast found on a screening mammogram.   PRIOR THERAPY:   #1 patient originally seen in the multidisciplinary breast clinic after needle core biopsy that showed a left invasive ductal carcinoma, grade 1 ER positive PR positive HER-2/neu negative with Ki-67 of 6%.   #2 patient has now gone on to have a lumpectomy of the left breast with sentinel node biopsy performed on 06/20/2011.   #3 the final pathology revealed a 1.0 cm invasive ductal carcinoma grade 1 ER positive PR positive HER-2/neu negative with Ki-67 of 6%.   #4 patient is currently receiving radiation therapy from 08/17/2011 , completed on 09/28/11  #5 she began adjuvant antiestrogen therapy consisting of Arimidex 1 mg daily October 02, 2011. This was stopped on 04/2013 due to increasing pain.   CURRENT THERAPY: Arimidex 1 mg daily (hold)   INTERVAL HISTORY:  Marilyn [name] 58 y.o. female returns for followup visit today.  She saw Dr. Owens Loffler on 05/22/2013 about starting back Arimidex.  She developed increased swelling, joint pain, and an increase in panic attacks.  She has an appointment with Dr. Terance Hart on 08/06/13 to discuss her panic attacks.  Her pain has improved since.  She otherwise denies fevers, chills, nausea, vomiting, constipation, diarrhea, new pain, unintentional weight loss, or any further concerns.     Past Medical History  Diagnosis Date  . History of bronchitis   . History of sarcoidosis   . Cigarette smoker   . Hyperlipidemia   . Diverticulosis of colon   . Colon polyp   . DJD (degenerative joint  disease)   . Vitamin D deficiency   . Panic disorder   . Depression   . History of anemia   . Complication of anesthesia     DIFFICULTY AWAKENING  . Headache(784.0)   . Incisional breast wound     AT AGE 76 BILATERAL INCISION TO BREAST MADE  . Cancer     BREAST - left  . 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 Anxiety     panic disorder  . GERD (gastroesophageal reflux disease)     Pt. denies having GERD. Unable to remove it.  Marland Kitchen DJD (degenerative joint disease)   . Allergy   . Anemia     hx  . Leg swelling   . S/P radiation therapy 08/17/11 - 09/28/11    LLQ - 50 Gy/25 Fractions with Boost of 10 Gy / 5 fractions    Past Surgical History  Procedure Laterality Date  . Total abdominal hysterectomy  2000    Dr Nori Riis  . Trigger finger release  2008    Dr Burney Gauze  . Bunionectomy  2011    bilateral by Dr Little Ishikawa  . Tonsillectomy  1972  . Cyst removed from right hand  1995  . Colonoscopy      polyp  . Breast surgery  07/05/11    left breast lumpectomy with needle loc & axillary sln bx  . Biopsy breast      left breast  as a teenager  benign    Current Outpatient Prescriptions  Medication Sig Dispense Refill  . ALPRAZolam (XANAX) 0.5 MG tablet Take 1/2 to 1 tablet by mouth three times daily as needed for anxiety  90 tablet  5  . buPROPion (WELLBUTRIN XL) 150 MG 24 hr tablet TAKE 1 TABLET (150 MG TOTAL) BY MOUTH DAILY.  90 tablet  1  . furosemide (LASIX) 20 MG tablet Take 1-2 tablets (20-40 mg total) by mouth daily as needed for edema.  60 tablet  3  . Calcium Carbonate-Vit D-Min (CALCIUM 1200) 1200-1000 MG-UNIT CHEW Chew 1,200 mg by mouth daily.  30 each  11  . cholecalciferol (VITAMIN D) 1000 UNITS tablet Take 1,000 Units by mouth daily.      . Clobetasol Propionate 0.05 % lotion Apply to affected areas 2-times per day for 2 weeks  118 mL  1  . clonazePAM (KLONOPIN) 0.5 MG tablet Take 1 tablet (0.5 mg total) by mouth 2  (two) times daily as needed for anxiety.  30 tablet  1   No current facility-administered medications for this visit.    ALLERGIES:  is allergic to penicillins.  REVIEW OF SYSTEMS:  A 10 point review of systems was conducted and is otherwise negative except for what is noted above.    Health Maintenance  Mammogram: 06/06/2013 Colonoscopy: 11/05/2009, repeat in 5 years recommended Bone Density Scan: 07/16/2013 Pap Smear:  S/p partial hysterectomy, 06/2013 Eye Exam: due  Vitamin D Level: 02/2012 Lipid Panel: 06/2013  PHYSICAL EXAMINATION:  BP 141/86  Pulse 75  Temp(Src) 98 F (36.7 C) (Oral)  Resp 18  Ht 5' 5"  (1.651 m)  Wt 239 lb 12.8 oz (108.773 kg)  BMI 39.90 kg/m2  GENERAL: Patient is a well appearing female in no acute distress HEENT:  Sclerae anicteric.  Oropharynx clear and moist. No ulcerations or evidence of oropharyngeal candidiasis. Neck is supple.  NODES:  No cervical, supraclavicular, or axillary lymphadenopathy palpated.  BREAST EXAM:  Deferred. LUNGS:  Clear to auscultation bilaterally.  No wheezes or rhonchi. HEART:  Regular rate and rhythm. No murmur appreciated. ABDOMEN:  Soft, nontender.  Positive, normoactive bowel sounds. No organomegaly palpated. MSK:  No focal spinal tenderness to palpation. Full range of motion bilaterally in the upper extremities. EXTREMITIES:  No peripheral edema.   SKIN:  Clear with no obvious rashes or skin changes. No nail dyscrasia. NEURO:  Nonfocal. Well oriented.  Appropriate affect. ECOG PERFORMANCE STATUS: 0 - Asymptomatic    LABORATORY/RADIOLOGY DATA:  Lab Results  Component Value Date   WBC 8.2 05/22/2013   HGB 12.1 05/22/2013   HCT 38.3 05/22/2013   PLT 222 05/22/2013   GLUCOSE 85 07/07/2013   CHOL 198 07/07/2013   TRIG 111.0 07/07/2013   HDL 58.80 07/07/2013   LDLDIRECT 118.9 12/18/2011   LDLCALC 117* 07/07/2013   ALKPHOS 126 05/22/2013   ALT 15 05/22/2013   AST 11 05/22/2013   NA 139 07/07/2013   K 4.2 07/07/2013   CL 105  07/07/2013   CREATININE 0.7 07/07/2013   BUN 12 07/07/2013   CO2 27 07/07/2013        ASSESSMENT AND PLAN:   1. Marilyn [name] 58 y.o. female with   #1 stage I invasive ductal carcinoma of the left breast status post lumpectomy the final pathology revealed a 1.0 cm ER/PR positive HER-2/neu negative grade 1 disease. Sentinel node was negative for metastatic disease.Patient completed her radiation therapy on 09/28/2011. She was then begun on  adjuvant antiestrogen therapy with Arimidex 1 mg daily. She had no evidence of recurrent disease.  #2 myalgias and arthralgias/carpal tunnel possibly secondary to Arimidex  #3 weight gain  PLAN:   We reviewed Marilyn [name]'s health maintenance and survivorship in detail.  She has no sign of recurrence.  I recommended healthy diet, exercise, and self breast exams monthly.  She will proceed with her evaluation by Dr. Terance Hart for her panic attacks.    We discussed smoking cessation.  She will return in 3 months for labs and evaluation by Dr. Jana Hakim to discuss possibly restarting anti-estrogen therapy.     All questions were answered. The patient knows to call the clinic with any problems, questions or concerns. We can certainly see the patient much sooner if necessary.   I spent 25 minutes counseling the patient face to face.  The total time spent in the appointment was 30 minutes.   Minette Headland, [place] [phone]

## 2013-08-06 ENCOUNTER — Ambulatory Visit (INDEPENDENT_AMBULATORY_CARE_PROVIDER_SITE_OTHER): Payer: PRIVATE HEALTH INSURANCE | Admitting: Psychiatry

## 2013-08-06 DIAGNOSIS — F4323 Adjustment disorder with mixed anxiety and depressed mood: Secondary | ICD-10-CM

## 2013-08-06 DIAGNOSIS — F0630 Mood disorder due to known physiological condition, unspecified: Secondary | ICD-10-CM

## 2013-08-07 ENCOUNTER — Telehealth: Payer: Self-pay | Admitting: *Deleted

## 2013-08-07 NOTE — Telephone Encounter (Signed)
Patient called and left a voicemail that [facility] has not called her to pick up new med. She states she and Charlestine Massed, NP discussed Tamoxifen at last appt and she would like this called to [facility]. Will forward to [employer] for approval.

## 2013-08-08 NOTE — Telephone Encounter (Signed)
Please call this in for patient.    Thanks,  [place]

## 2013-10-02 ENCOUNTER — Telehealth: Payer: Self-pay | Admitting: *Deleted

## 2013-10-02 NOTE — Telephone Encounter (Signed)
Script for compression sleeve signed by Dr. Jana Hakim and faxed as requested. Patient notified via My chart.

## 2013-10-02 NOTE — Telephone Encounter (Signed)
Needs new script for compression sleeve sent to [facility] # [phone]. Also never got the script for Tamoxifen that MD suggested at last visit, so she has decided to resume her Arimidex 1 mg daily since her physical health is better now with exercise-thinks her poor physical condition may have contributed to the aches she had with it before. Will let NP know her status at next visit in September.  Called Melissa Dark at [facility]: confirmed she needs a compression class II sleeve with glove at 30-40. Will get script sent.

## 2013-10-31 ENCOUNTER — Other Ambulatory Visit: Payer: Self-pay | Admitting: *Deleted

## 2013-10-31 DIAGNOSIS — E785 Hyperlipidemia, unspecified: Secondary | ICD-10-CM

## 2013-10-31 DIAGNOSIS — C50419 Malignant neoplasm of upper-outer quadrant of unspecified female breast: Secondary | ICD-10-CM

## 2013-11-04 ENCOUNTER — Other Ambulatory Visit (HOSPITAL_BASED_OUTPATIENT_CLINIC_OR_DEPARTMENT_OTHER): Payer: PRIVATE HEALTH INSURANCE

## 2013-11-04 DIAGNOSIS — C50419 Malignant neoplasm of upper-outer quadrant of unspecified female breast: Secondary | ICD-10-CM

## 2013-11-04 DIAGNOSIS — C50919 Malignant neoplasm of unspecified site of unspecified female breast: Secondary | ICD-10-CM

## 2013-11-04 DIAGNOSIS — E785 Hyperlipidemia, unspecified: Secondary | ICD-10-CM

## 2013-11-04 LAB — CBC WITH DIFFERENTIAL/PLATELET
BASO%: 0.3 % (ref 0.0–2.0)
Basophils Absolute: 0 10*3/uL (ref 0.0–0.1)
EOS%: 2.1 % (ref 0.0–7.0)
Eosinophils Absolute: 0.2 10*3/uL (ref 0.0–0.5)
HCT: 38.9 % (ref 34.8–46.6)
HGB: 12.2 g/dL (ref 11.6–15.9)
LYMPH%: 23.2 % (ref 14.0–49.7)
MCH: 23.3 pg — AB (ref 25.1–34.0)
MCHC: 31.2 g/dL — ABNORMAL LOW (ref 31.5–36.0)
MCV: 74.7 fL — ABNORMAL LOW (ref 79.5–101.0)
MONO#: 0.5 10*3/uL (ref 0.1–0.9)
MONO%: 6.5 % (ref 0.0–14.0)
NEUT#: 5.2 10*3/uL (ref 1.5–6.5)
NEUT%: 67.9 % (ref 38.4–76.8)
PLATELETS: 247 10*3/uL (ref 145–400)
RBC: 5.21 10*6/uL (ref 3.70–5.45)
RDW: 14.7 % — AB (ref 11.2–14.5)
WBC: 7.7 10*3/uL (ref 3.9–10.3)
lymph#: 1.8 10*3/uL (ref 0.9–3.3)

## 2013-11-04 LAB — COMPREHENSIVE METABOLIC PANEL (CC13)
ALK PHOS: 108 U/L (ref 40–150)
ALT: 13 U/L (ref 0–55)
AST: 12 U/L (ref 5–34)
Albumin: 3.6 g/dL (ref 3.5–5.0)
Anion Gap: 9 mEq/L (ref 3–11)
BUN: 12.1 mg/dL (ref 7.0–26.0)
CO2: 23 mEq/L (ref 22–29)
Calcium: 9.6 mg/dL (ref 8.4–10.4)
Chloride: 106 mEq/L (ref 98–109)
Creatinine: 0.8 mg/dL (ref 0.6–1.1)
GLUCOSE: 100 mg/dL (ref 70–140)
Potassium: 3.8 mEq/L (ref 3.5–5.1)
SODIUM: 138 meq/L (ref 136–145)
TOTAL PROTEIN: 7.2 g/dL (ref 6.4–8.3)
Total Bilirubin: 0.28 mg/dL (ref 0.20–1.20)

## 2013-11-04 LAB — LIPID PANEL
CHOL/HDL RATIO: 3.4 ratio
Cholesterol: 189 mg/dL (ref 0–200)
HDL: 55 mg/dL (ref >39)
LDL CALC: 105 mg/dL — AB (ref 0–99)
Triglycerides: 145 mg/dL (ref <150)
VLDL: 29 mg/dL (ref 0–40)

## 2013-11-05 ENCOUNTER — Encounter: Payer: Self-pay | Admitting: Gastroenterology

## 2013-11-07 ENCOUNTER — Telehealth: Payer: Self-pay | Admitting: *Deleted

## 2013-11-07 NOTE — Telephone Encounter (Signed)
Message copied by Harmon Pier on Fri Nov 07, 2013  9:34 AM ------      Message from: Minette Headland      Created: Wed Nov 05, 2013  2:40 PM                   ----- Message -----         From: [facility]         Sent: 11/04/2013   9:39 AM           To: Minette Headland, NP             ------

## 2013-11-07 NOTE — Telephone Encounter (Signed)
Called pt with labs results. Pt verbalized understanding. No further concerns. Message to be forwarded to Charlestine Massed, NP.

## 2013-11-10 ENCOUNTER — Telehealth: Payer: Self-pay | Admitting: Adult Health

## 2013-11-10 ENCOUNTER — Ambulatory Visit (HOSPITAL_BASED_OUTPATIENT_CLINIC_OR_DEPARTMENT_OTHER): Payer: PRIVATE HEALTH INSURANCE | Admitting: Adult Health

## 2013-11-10 ENCOUNTER — Encounter: Payer: Self-pay | Admitting: Adult Health

## 2013-11-10 VITALS — BP 128/77 | HR 75 | Temp 98.0°F | Resp 20 | Ht 65.0 in | Wt 232.1 lb

## 2013-11-10 DIAGNOSIS — C50419 Malignant neoplasm of upper-outer quadrant of unspecified female breast: Secondary | ICD-10-CM

## 2013-11-10 DIAGNOSIS — Z170 Estrogen receptor positive status [ER+]: Secondary | ICD-10-CM

## 2013-11-10 DIAGNOSIS — C50919 Malignant neoplasm of unspecified site of unspecified female breast: Secondary | ICD-10-CM

## 2013-11-10 DIAGNOSIS — Z23 Encounter for immunization: Secondary | ICD-10-CM

## 2013-11-10 MED ORDER — INFLUENZA VAC SPLIT QUAD 0.5 ML IM SUSY
0.5000 mL | PREFILLED_SYRINGE | Freq: Once | INTRAMUSCULAR | Status: AC
  Administered 2013-11-10: 0.5 mL via INTRAMUSCULAR
  Filled 2013-11-10: qty 0.5

## 2013-11-10 NOTE — Telephone Encounter (Signed)
per pof to sch appt-gave pt sch °

## 2013-11-10 NOTE — Patient Instructions (Signed)
You are doing well.  You have no sign of recurrence.  Continue taking Arimidex.  I recommend healthy diet, exercise, and monthly breast exams.    Breast Self-Awareness Practicing breast self-awareness may pick up problems early, prevent significant medical complications, and possibly save your life. By practicing breast self-awareness, you can become familiar with how your breasts look and feel and if your breasts are changing. This allows you to notice changes early. It can also offer you some reassurance that your breast health is good. One way to learn what is normal for your breasts and whether your breasts are changing is to do a breast self-exam. If you find a lump or something that was not present in the past, it is best to contact your caregiver right away. Other findings that should be evaluated by your caregiver include nipple discharge, especially if it is bloody; skin changes or reddening; areas where the skin seems to be pulled in (retracted); or new lumps and bumps. Breast pain is seldom associated with cancer (malignancy), but should also be evaluated by a caregiver. HOW TO PERFORM A BREAST SELF-EXAM The best time to examine your breasts is 5-7 days after your menstrual period is over. During menstruation, the breasts are lumpier, and it may be more difficult to pick up changes. If you do not menstruate, have reached menopause, or had your uterus removed (hysterectomy), you should examine your breasts at regular intervals, such as monthly. If you are breastfeeding, examine your breasts after a feeding or after using a breast pump. Breast implants do not decrease the risk for lumps or tumors, so continue to perform breast self-exams as recommended. Talk to your caregiver about how to determine the difference between the implant and breast tissue. Also, talk about the amount of pressure you should use during the exam. Over time, you will become more familiar with the variations of your breasts and  more comfortable with the exam. A breast self-exam requires you to remove all your clothes above the waist. 1. Look at your breasts and nipples. Stand in front of a mirror in a room with good lighting. With your hands on your hips, push your hands firmly downward. Look for a difference in shape, contour, and size from one breast to the other (asymmetry). Asymmetry includes puckers, dips, or bumps. Also, look for skin changes, such as reddened or scaly areas on the breasts. Look for nipple changes, such as discharge, dimpling, repositioning, or redness. 2. Carefully feel your breasts. This is best done either in the shower or tub while using soapy water or when flat on your back. Place the arm (on the side of the breast you are examining) above your head. Use the pads (not the fingertips) of your three middle fingers on your opposite hand to feel your breasts. Start in the underarm area and use  inch (2 cm) overlapping circles to feel your breast. Use 3 different levels of pressure (light, medium, and firm pressure) at each circle before moving to the next circle. The light pressure is needed to feel the tissue closest to the skin. The medium pressure will help to feel breast tissue a little deeper, while the firm pressure is needed to feel the tissue close to the ribs. Continue the overlapping circles, moving downward over the breast until you feel your ribs below your breast. Then, move one finger-width towards the center of the body. Continue to use the  inch (2 cm) overlapping circles to feel your breast as you  move slowly up toward the collar bone (clavicle) near the base of the neck. Continue the up and down exam using all 3 pressures until you reach the middle of the chest. Do this with each breast, carefully feeling for lumps or changes. 3.  Keep a written record with breast changes or normal findings for each breast. By writing this information down, you do not need to depend only on memory for size,  tenderness, or location. Write down where you are in your menstrual cycle, if you are still menstruating. Breast tissue can have some lumps or thick tissue. However, see your caregiver if you find anything that concerns you.  SEEK MEDICAL CARE IF:  You see a change in shape, contour, or size of your breasts or nipples.   You see skin changes, such as reddened or scaly areas on the breasts or nipples.   You have an unusual discharge from your nipples.   You feel a new lump or unusually thick areas.  Document Released: 02/13/2005 Document Revised: 01/31/2012 Document Reviewed: 05/31/2011 [facility] Patient Information 2015 [place], [place]. This information is not intended to replace advice given to you by your health care provider. Make sure you discuss any questions you have with your health care provider.

## 2013-11-10 NOTE — Progress Notes (Signed)
[facility]  [phone] [phone]   OFFICE PROGRESS NOTE   Cc:  Walker Kehr, MD  [facility] [place] [address] [place] [address]  Dr. Excell Seltzer  Dr. Evette Cristal  Dr. Eppie Gibson   DIAGNOSIS: 58 year old female who originally was seen in the multidisciplinary breast clinic for a stage I invasive ductal carcinoma of the left breast found on a screening mammogram.   PRIOR THERAPY:   #1 patient originally seen in the multidisciplinary breast clinic after needle core biopsy that showed a left invasive ductal carcinoma, grade 1 ER positive PR positive HER-2/neu negative with Ki-67 of 6%.   #2 patient underwent lumpectomy of the left breast with sentinel node biopsy performed on 06/20/2011.   #3 the final pathology revealed a 1.0 cm invasive ductal carcinoma grade 1 ER positive PR positive HER-2/neu negative with Ki-67 of 6%.   #4 patient received radiation therapy from 08/17/2011 through 09/28/11.  #5 she began adjuvant antiestrogen therapy consisting of Arimidex 1 mg daily October 02, 2011. This was stopped on 04/2013 due to increasing pain and restarted in August, 2015.   CURRENT THERAPY: Arimidex 1 mg daily   INTERVAL HISTORY:  Marilyn [name] 58 y.o. female returns for followup visit today.  She did restart the Arimidex the first week of August and appears to be tolerating it well.  She has been doing the [employer] program at the [facility] and her joint aches are much less severe due to this.  She is taking Wellbutrin, and did have a slight recurrence of her panic attacks.  She did say that they are not as severe as they once were.  She continues to take Xanax PRN when needed.  She does have occasional hot flashes due to the Arimidex.  She has some mild dryness as well.  She denies any new pain, bowel/bladder changes, vision changes, headaches, or any further concerns. We updated her health maintenance below.     Past  Medical History  Diagnosis Date  . History of bronchitis   . History of sarcoidosis   . Cigarette smoker   . Hyperlipidemia   . Diverticulosis of colon   . Colon polyp   . DJD (degenerative joint disease)   . Vitamin D deficiency   . Panic disorder   . Depression   . History of anemia   . Complication of anesthesia     DIFFICULTY AWAKENING  . Headache(784.0)   . Incisional breast wound     AT AGE 35 BILATERAL INCISION TO BREAST MADE  . Cancer     BREAST - left  . 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 Anxiety     panic disorder  . GERD (gastroesophageal reflux disease)     Pt. denies having GERD. Unable to remove it.  Marland Kitchen DJD (degenerative joint disease)   . Allergy   . Anemia     hx  . Leg swelling   . S/P radiation therapy 08/17/11 - 09/28/11    LLQ - 50 Gy/25 Fractions with Boost of 10 Gy / 5 fractions    Past Surgical History  Procedure Laterality Date  . Total abdominal hysterectomy  2000    Dr Nori Riis  . Trigger finger release  2008    Dr Burney Gauze  . Bunionectomy  2011    bilateral by Dr Little Ishikawa  . Tonsillectomy  1972  . Cyst  removed from right hand  1995  . Colonoscopy      polyp  . Breast surgery  07/05/11    left breast lumpectomy with needle loc & axillary sln bx  . Biopsy breast      left breast  as a teenager  benign    Current Outpatient Prescriptions  Medication Sig Dispense Refill  . ALPRAZolam (XANAX) 0.5 MG tablet Take 1/2 to 1 tablet by mouth three times daily as needed for anxiety  90 tablet  5  . anastrozole (ARIMIDEX) 1 MG tablet Take 1 mg by mouth daily.      Marland Kitchen buPROPion (WELLBUTRIN XL) 150 MG 24 hr tablet TAKE 1 TABLET (150 MG TOTAL) BY MOUTH DAILY.  90 tablet  1  . Calcium Carbonate-Vit D-Min (CALCIUM 1200) 1200-1000 MG-UNIT CHEW Chew 1,200 mg by mouth daily.  30 each  11  . cholecalciferol (VITAMIN D) 1000 UNITS tablet Take 1,000 Units by mouth daily.      . Clobetasol Propionate 0.05 %  lotion Apply to affected areas 2-times per day for 2 weeks  118 mL  1  . furosemide (LASIX) 20 MG tablet Take 1-2 tablets (20-40 mg total) by mouth daily as needed for edema.  60 tablet  3   No current facility-administered medications for this visit.    ALLERGIES:  is allergic to penicillins.  REVIEW OF SYSTEMS:  A 10 point review of systems was conducted and is otherwise negative except for what is noted above.    Health Maintenance  Mammogram: 06/06/2013 Colonoscopy: 11/05/2009, repeat in 5 years recommended Bone Density Scan: 07/16/2013 Pap Smear:  S/p partial hysterectomy, 06/2013 Eye Exam: 08/2013 Vitamin D Level: 02/2012 Lipid Panel: 10/2013  PHYSICAL EXAMINATION:  BP 128/77  Pulse 75  Temp(Src) 98 F (36.7 C) (Oral)  Resp 20  Ht 5' 5"  (1.651 m)  Wt 232 lb 1.6 oz (105.28 kg)  BMI 38.62 kg/m2 GENERAL: Patient is a well appearing female in no acute distress HEENT:  Sclerae anicteric.  Oropharynx clear and moist. No ulcerations or evidence of oropharyngeal candidiasis. Neck is supple.  NODES:  No cervical, supraclavicular, or axillary lymphadenopathy palpated.  BREAST EXAM:  Right breast without masses or nodules, left breast without masses, nodules, slight thickening due to radiation, left lumpectomy site without nodularity.  Benign bilateral breast exam.  LUNGS:  Clear to auscultation bilaterally.  No wheezes or rhonchi. HEART:  Regular rate and rhythm. No murmur appreciated. ABDOMEN:  Soft, nontender.  Positive, normoactive bowel sounds. No organomegaly palpated. MSK:  No focal spinal tenderness to palpation. Full range of motion bilaterally in the upper extremities. EXTREMITIES:  No peripheral edema.   SKIN:  Clear with no obvious rashes or skin changes. No nail dyscrasia. NEURO:  Nonfocal. Well oriented.  Appropriate affect. ECOG PERFORMANCE STATUS: 0 - Asymptomatic    LABORATORY/RADIOLOGY DATA:  Lab Results  Component Value Date   WBC 7.7 11/04/2013   HGB 12.2 11/04/2013    HCT 38.9 11/04/2013   PLT 247 11/04/2013   GLUCOSE 100 11/04/2013   CHOL 189 11/04/2013   TRIG 145 11/04/2013   HDL 55 11/04/2013   LDLDIRECT 118.9 12/18/2011   LDLCALC 105* 11/04/2013   ALKPHOS 108 11/04/2013   ALT 13 11/04/2013   AST 12 11/04/2013   NA 138 11/04/2013   K 3.8 11/04/2013   CL 105 07/07/2013   CREATININE 0.8 11/04/2013   BUN 12.1 11/04/2013   CO2 23 11/04/2013    ASSESSMENT AND PLAN:  1. Dewitt Rota 58 y.o. female with   stage I invasive ductal carcinoma of the left breast status post lumpectomy the final pathology revealed a 1.0 cm ER/PR positive HER-2/neu negative grade 1 disease. Sentinel node was negative for metastatic disease. Patient completed her radiation therapy on 09/28/2011. She was then begun on adjuvant antiestrogen therapy with Arimidex 1 mg daily. She had no evidence of recurrent disease.   PLAN:   Marilyn [name] is doing well today.  She has restarted Arimidex 37m daily and continues to tolerate it well.  She has no sign of recurrence.  Beginning an exercise routine has really helped her out with the joint pain and with her anxiety.  She is taking Wellbutrin and Xanax PRN, and hasn't had a panic attack in one month.  Marilyn [name] continue to exercise, and continue her Arimidex daily.  I reviewed her labs with her today which were stable.    I reviewed her health maintenance and it is up to date.  I recommended that she continue healthy diet, exercise, and monthly breast exams.  I gave her information on how to examine her own breasts in her AVS.    MXuanwill return in 6 months for labs and follow up.  She is in agreement with the above plan.    All questions were answered. The patient knows to call the clinic with any problems, questions or concerns. We can certainly see the patient much sooner if necessary.   I spent 25 minutes counseling the patient face to face.  The total time spent in the appointment was 30 minutes.  LMinette [name] [phone]

## 2013-11-28 ENCOUNTER — Ambulatory Visit: Payer: PRIVATE HEALTH INSURANCE | Admitting: Oncology

## 2013-11-28 ENCOUNTER — Other Ambulatory Visit: Payer: PRIVATE HEALTH INSURANCE

## 2013-12-21 ENCOUNTER — Other Ambulatory Visit: Payer: Self-pay | Admitting: Oncology

## 2013-12-21 DIAGNOSIS — C50412 Malignant neoplasm of upper-outer quadrant of left female breast: Secondary | ICD-10-CM

## 2013-12-26 ENCOUNTER — Other Ambulatory Visit: Payer: Self-pay | Admitting: Oncology

## 2013-12-29 ENCOUNTER — Encounter: Payer: Self-pay | Admitting: Adult Health

## 2014-01-07 ENCOUNTER — Encounter: Payer: Self-pay | Admitting: Internal Medicine

## 2014-01-07 ENCOUNTER — Ambulatory Visit (INDEPENDENT_AMBULATORY_CARE_PROVIDER_SITE_OTHER): Payer: PRIVATE HEALTH INSURANCE | Admitting: Internal Medicine

## 2014-01-07 VITALS — BP 136/76 | HR 89 | Temp 98.1°F | Resp 16 | Ht 65.0 in | Wt 235.0 lb

## 2014-01-07 DIAGNOSIS — I872 Venous insufficiency (chronic) (peripheral): Secondary | ICD-10-CM

## 2014-01-07 NOTE — Progress Notes (Signed)
Patient ID: Marilyn [name], female   DOB: 12-Aug-1955, 58 y.o.   MRN: [id number]   Subjective:    HPI    C/o ankle swelling, wt gain  Side effects w/Arimidex since 09/2011- arthralgias, swelling  Wt Readings from Last 3 Encounters:  01/07/14 235 lb 0.6 oz (106.613 kg)  11/10/13 232 lb 1.6 oz (105.28 kg)  08/01/13 239 lb 12.8 oz (108.773 kg)   BP Readings from Last 3 Encounters:  01/07/14 136/76  11/10/13 128/77  08/01/13 141/86       Review of Systems  Constitutional: Positive for unexpected weight change. Negative for chills, activity change, appetite change and fatigue.  HENT: Negative for congestion, mouth sores and sinus pressure.   Eyes: Negative for visual disturbance.  Respiratory: Negative for cough, chest tightness and wheezing.   Gastrointestinal: Negative for nausea and abdominal pain.  Genitourinary: Negative for frequency, difficulty urinating and vaginal pain.  Musculoskeletal: Negative for myalgias, back pain and gait problem.  Skin: Negative for pallor and rash.  Neurological: Negative for dizziness, tremors, weakness, numbness and headaches.  Psychiatric/Behavioral: Negative for suicidal ideas, confusion and sleep disturbance. The patient is nervous/anxious.        Objective:   Physical Exam  Constitutional: She appears well-developed. No distress.  HENT:  Head: Normocephalic.  Right Ear: External ear normal.  Left Ear: External ear normal.  Nose: Nose normal.  Mouth/Throat: Oropharynx is clear and moist.  Eyes: Conjunctivae are normal. Pupils are equal, round, and reactive to light. Right eye exhibits no discharge. Left eye exhibits no discharge.  Neck: Normal range of motion. Neck supple. No JVD present. No tracheal deviation present. No thyromegaly present.  Cardiovascular: Normal rate, regular rhythm and normal heart sounds.   Pulmonary/Chest: No stridor. No respiratory distress. She has no wheezes.  Abdominal: Soft. Bowel sounds are normal.  She exhibits no distension and no mass. There is no tenderness. There is no rebound and no guarding.  Musculoskeletal: She exhibits no edema or tenderness.  Lymphadenopathy:    She has no cervical adenopathy.  Neurological: She displays normal reflexes. No cranial nerve deficit. She exhibits normal muscle tone. Coordination normal.  Skin: No rash noted. No erythema.  Psychiatric: She has a normal mood and affect. Her behavior is normal. Judgment and thought content normal.  trace edema B   Lab Results  Component Value Date   WBC 7.7 11/04/2013   HGB 12.2 11/04/2013   HCT 38.9 11/04/2013   PLT 247 11/04/2013   GLUCOSE 100 11/04/2013   CHOL 189 11/04/2013   TRIG 145 11/04/2013   HDL 55 11/04/2013   LDLDIRECT 118.9 12/18/2011   LDLCALC 105* 11/04/2013   ALT 13 11/04/2013   AST 12 11/04/2013   NA 138 11/04/2013   K 3.8 11/04/2013   CL 105 07/07/2013   CREATININE 0.8 11/04/2013   BUN 12.1 11/04/2013   CO2 23 11/04/2013   TSH 1.02 07/07/2013         Assessment & Plan:

## 2014-01-07 NOTE — Progress Notes (Signed)
Pre visit review using our clinic review tool, if applicable. No additional management support is needed unless otherwise documented below in the visit note. 

## 2014-01-09 NOTE — Assessment & Plan Note (Signed)
Elevate legs as needed  

## 2014-01-09 NOTE — Assessment & Plan Note (Signed)
On treatment.  

## 2014-01-09 NOTE — Assessment & Plan Note (Signed)
Continue with current prescription therapy as reflected on the Med list.  

## 2014-01-13 ENCOUNTER — Telehealth: Payer: Self-pay | Admitting: Internal Medicine

## 2014-01-13 NOTE — Telephone Encounter (Signed)
emmi emailed °

## 2014-01-20 ENCOUNTER — Encounter: Payer: Self-pay | Admitting: *Deleted

## 2014-01-20 NOTE — [facility] Oncology Navigator Note (Signed)
Called patient to check in.  She reports that she continues to do well.  She is taking her Arimidex and is tolerating well.  She had been exercising through the [place] program but reports that the program ended and she is looking for another exercise program.  We discussed her next appointment in March and that she will be seen by Dr. Lindi Adie. She denied any needs at this time.  I encouraged her to call me for any questions or concerns.

## 2014-02-27 DIAGNOSIS — Z923 Personal history of irradiation: Secondary | ICD-10-CM | POA: Insufficient documentation

## 2014-02-27 DIAGNOSIS — K1120 Sialoadenitis, unspecified: Secondary | ICD-10-CM

## 2014-02-27 HISTORY — DX: Sialoadenitis, unspecified: K11.20

## 2014-03-14 ENCOUNTER — Encounter (HOSPITAL_COMMUNITY): Payer: Self-pay | Admitting: *Deleted

## 2014-03-14 DIAGNOSIS — Z880 Allergy status to penicillin: Secondary | ICD-10-CM | POA: Insufficient documentation

## 2014-03-14 DIAGNOSIS — Z8739 Personal history of other diseases of the musculoskeletal system and connective tissue: Secondary | ICD-10-CM | POA: Insufficient documentation

## 2014-03-14 DIAGNOSIS — F410 Panic disorder [episodic paroxysmal anxiety] without agoraphobia: Secondary | ICD-10-CM | POA: Insufficient documentation

## 2014-03-14 DIAGNOSIS — R0789 Other chest pain: Secondary | ICD-10-CM | POA: Diagnosis not present

## 2014-03-14 DIAGNOSIS — Z853 Personal history of malignant neoplasm of breast: Secondary | ICD-10-CM | POA: Diagnosis not present

## 2014-03-14 DIAGNOSIS — Z8709 Personal history of other diseases of the respiratory system: Secondary | ICD-10-CM | POA: Diagnosis not present

## 2014-03-14 DIAGNOSIS — Z8719 Personal history of other diseases of the digestive system: Secondary | ICD-10-CM | POA: Diagnosis not present

## 2014-03-14 DIAGNOSIS — Z79899 Other long term (current) drug therapy: Secondary | ICD-10-CM | POA: Diagnosis not present

## 2014-03-14 DIAGNOSIS — Z862 Personal history of diseases of the blood and blood-forming organs and certain disorders involving the immune mechanism: Secondary | ICD-10-CM | POA: Diagnosis not present

## 2014-03-14 DIAGNOSIS — R079 Chest pain, unspecified: Secondary | ICD-10-CM | POA: Diagnosis present

## 2014-03-14 DIAGNOSIS — Z86010 Personal history of colonic polyps: Secondary | ICD-10-CM | POA: Insufficient documentation

## 2014-03-14 DIAGNOSIS — E559 Vitamin D deficiency, unspecified: Secondary | ICD-10-CM | POA: Insufficient documentation

## 2014-03-14 DIAGNOSIS — Z720 Tobacco use: Secondary | ICD-10-CM | POA: Diagnosis not present

## 2014-03-14 NOTE — ED Notes (Signed)
The pt is c/o having a panic attacj when her bad came here with chest pain as a pt.  She takes xanax for this and she has taken some.  She also has some pain under her lt breast with movement for awhile.  Breast cancer on the lt and radiation.  No pain unless she moves her arm

## 2014-03-15 ENCOUNTER — Emergency Department (HOSPITAL_COMMUNITY): Payer: PRIVATE HEALTH INSURANCE

## 2014-03-15 ENCOUNTER — Emergency Department (HOSPITAL_COMMUNITY)
Admission: EM | Admit: 2014-03-15 | Discharge: 2014-03-15 | Disposition: A | Discharge status: Home / Self Care | Payer: PRIVATE HEALTH INSURANCE | Attending: Emergency Medicine | Admitting: Emergency Medicine

## 2014-03-15 DIAGNOSIS — R0789 Other chest pain: Secondary | ICD-10-CM

## 2014-03-15 DIAGNOSIS — F419 Anxiety disorder, unspecified: Secondary | ICD-10-CM

## 2014-03-15 DIAGNOSIS — F410 Panic disorder [episodic paroxysmal anxiety] without agoraphobia: Secondary | ICD-10-CM

## 2014-03-15 LAB — I-STAT CHEM 8, ED
BUN: 12 mg/dL (ref 6–23)
CALCIUM ION: 1.27 mmol/L (ref 1.13–1.30)
Chloride: 106 mEq/L (ref 96–112)
Creatinine, Ser: 0.7 mg/dL (ref 0.50–1.10)
Glucose, Bld: 110 mg/dL — ABNORMAL HIGH (ref 70–99)
HCT: 38 % (ref 36.0–46.0)
Hemoglobin: 12.9 g/dL (ref 12.0–15.0)
Potassium: 4 mmol/L (ref 3.5–5.1)
SODIUM: 140 mmol/L (ref 135–145)
TCO2: 20 mmol/L (ref 0–100)

## 2014-03-15 LAB — I-STAT TROPONIN, ED: Troponin i, poc: 0 ng/mL (ref 0.00–0.08)

## 2014-03-15 LAB — D-DIMER, QUANTITATIVE (NOT AT ARMC): D DIMER QUANT: 0.35 ug{FEU}/mL (ref 0.00–0.48)

## 2014-03-15 MED ORDER — METHOCARBAMOL 500 MG PO TABS: 1000.0000 mg | ORAL_TABLET | Freq: Four times a day (QID) | ORAL | Status: DC | PRN

## 2014-03-15 MED ORDER — HYDROCODONE-ACETAMINOPHEN 5-325 MG PO TABS: ORAL_TABLET | ORAL | Status: DC

## 2014-03-15 NOTE — Discharge Instructions (Signed)
°Emergency Department Resource Guide °1) Find a Doctor and Pay Out of Pocket °Although you won't have to find out who is covered by your insurance plan, it is a good idea to ask around and get recommendations. You will then need to call the office and see if the doctor you have chosen will accept you as a new patient and what types of options they offer for patients who are self-pay. Some doctors offer discounts or will set up payment plans for their patients who do not have insurance, but you will need to ask so you aren't surprised when you get to your appointment. ° °2) Contact Your Local Health Department °Not all health departments have doctors that can see patients for sick visits, but many do, so it is worth a call to see if yours does. If you don't know where your local health department is, you can check in your phone book. The CDC also has a tool to help you locate your state's health department, and many state websites also have listings of all of their local health departments. ° °3) Find a Walk-in Clinic °If your illness is not likely to be very severe or complicated, you may want to try a walk in clinic. These are popping up all over the country in pharmacies, drugstores, and shopping centers. They're usually staffed by nurse practitioners or physician assistants that have been trained to treat common illnesses and complaints. They're usually fairly quick and inexpensive. However, if you have serious medical issues or chronic medical problems, these are probably not your best option. ° °No Primary Care Doctor: °- Call [facility] at  832-8000 - they can help you locate a primary care doctor that  accepts your insurance, provides certain services, etc. °- Physician Referral Service- 1-800-533-3463 ° °Chronic Pain Problems: °Organization         Address  Phone   Notes  °[facility]  (336) 297-2271 Patients need to be referred by their primary care doctor.  ° °Medication  Assistance: °Organization         Address  Phone   Notes  °[place] [facility] [address] °[place], [place] [address] (336) 641-8030 --Must be a resident of [place] °-- Must have NO insurance coverage whatsoever (no Medicaid/ Medicare, etc.) °-- The pt. MUST have a primary care doctor that directs their care regularly and follows them in the community °  °[facility]  (866) 331-1348   °[employer]  (888) 892-1162   ° °Agencies that provide inexpensive medical care: °Organization         Address  Phone   Notes  °[facility]  (336) 832-8035   °[facility]    (336) 832-7272   °[facility] [address] °[place], [place] [address] (336) 832-4777   °[facility] [address], °[place] (336) 271-4999   °[facility]    (336) 373-0678   °[facility]    (336) 272-1050   °[facility] ° [address], [place] Phone:  (336) 832-4444, Fax:  (336) 832-4440 Hours of Operation:  9 am - 6 pm, M-F.  Also accepts Medicaid/Medicare and self-pay.  °[facility] ° [address], [place] Phone: (336) 832-3150, Fax: (336) 832-3151. Hours of Operation:  8:30 am - 5:30 pm, M-F.  Also accepts Medicaid and self-pay.  °[facility] [place] [address], [place] Phone: (336) 878-6027   °[facility] [address], [place], [place] (336)723-1848, Ext. 123 Mondays & Thursdays: 7-9 AM.  First 15 patients are seen on a first come, first serve basis. °  ° °Medicaid-accepting [place] Providers: ° °Organization         Address  Phone   Notes  °[facility] [address], [place] (336) 641-2100 Also accepts self-pay patients.  °[facility] [address], [place] ° (336) 856-9996   °[facility] [address], [place]  (336) 288-8857   °[facility] [address] [place] [address], [place] (336) 299-7000   °Veita Bland [address], [place]  ° (336) 373-1557 Only accepts [place] Access Medicaid patients after they have their name applied to their card.  ° °Self-Pay (no insurance) in [place]: ° °Organization         Address  Phone   Notes  °Sickle Cell Patients, [facility] [address], [place] (336) 832-1970   °[facility] [address], [place] (336) 832-4400   °[facility] ° [address], [place] (336) 992-4800   °[facility]/Dr. Osei-Bonsu ° [address] [place] [address], [place] or [address], [place] (336) 841-8500 Phone number for both [place] and [place] locations is the same.  °[facility] [address], [place] (336) 299-0000   °[facility] [address] [place] [address], [place] or [address] (336) 852-7530 °(336) 878-2260   °[facility] [address], [place] (336) 350-1642, phone; (336) 294-5005, fax Sees patients 1st and 3rd Saturday of every month.  Must not qualify for public or private insurance (i.e. Medicaid, Medicare, [place] Health Choice, Veterans' Benefits) • Household income should be no more than 200% of the poverty level •The clinic cannot treat you if you are pregnant or think you are pregnant • Sexually transmitted diseases are not treated at the clinic.  ° ° °Dental Care: °Organization         Address  Phone  Notes  °[place] [facility] [address], [place] (336) 641-6152 Accepts children up to age 21 who are enrolled in Medicaid or [place] Health Choice; pregnant women with a Medicaid card; and children who have applied for Medicaid or [place] Health Choice, but were declined, whose parents can pay a reduced fee at time of service.  °[place]  [facility] [place]  [address], [place] (336) 641-7733 Accepts children up to age 21 who are enrolled in Medicaid or [place] Health Choice; pregnant women with a Medicaid card; and children who have applied for Medicaid or [place] Health Choice, but were declined, whose parents can pay a reduced fee at time of service.  °[facility] ° [address],  (336) 641-4533 Patients are seen by appointment only. Walk-ins are not accepted. [facility] will see patients 18 years of age and older. °Monday - Tuesday (8am-5pm) °Most Wednesdays (8:30-5pm) °$30 per visit, cash only  °[facility] ° [address], [place] (336) 641-4533 Patients are seen by appointment only. Walk-ins are not accepted. [facility] will see patients 18 years of age and older. °One   Wednesday Evening (Monthly: Volunteer Based).  $30 per visit, cash only  °[facility]  (919) 537-3737 for adults; Children under age 4, call Graduate Pediatric Dentistry at ([phone]) [phone]. Children aged 4-14, please call (919) [phone] to request a pediatric application. ° Dental services are provided in all areas of dental care including fillings, crowns and bridges, complete and partial dentures, implants, gum treatment, root canals, and extractions. Preventive care is also provided. Treatment is provided to both adults and children. °Patients are selected via a lottery and there is often a waiting list. °  °[facility] [address], °[place] ° (336) 763-8833 [email or web address].com °  °[facility] [address], [place], [place] (336)723-1848, Ext. 123 Second and Fourth Thursday of each month, opens at 6:30 AM; Clinic ends at 9 AM.  Patients are seen on a first-come first-served basis, and a limited number are seen during each clinic.  ° °[facility] ° [address], [place], [place] (336) 723-7904    Eligibility Requirements °You must have lived in [place], [place], or [place] counties for at least the last three months. °  You cannot be eligible for state or federal sponsored healthcare insurance, including Veterans Administration, Medicaid, or Medicare. °  You generally cannot be eligible for healthcare insurance through your employer.  °  How to apply: °Eligibility screenings are held every Tuesday and Wednesday afternoon from 1:00 pm until 4:00 pm. You do not need an appointment for the interview!  °[facility] [address], [place], [place] 336-631-2330   °[facility]  336-342-8273   °[facility]  336-703-3100   °[facility]  336-570-6415   ° °Behavioral Health Resources in the Community: °Intensive Outpatient Programs °Organization         Address  Phone  Notes  °[facility] [address], [place], [place] 336-878-6098   °[facility] [address], [place], [place] 336-832-9800   °[facility] [address], [place], [place] ° 336-882-2125   °[facility] [address],  °[place], [place] 1-800-853-5163 or 336-641-4981   °Substance Abuse Resources °Organization         Address  Phone  Notes  °[facility]  336-882-2125   °[facility]  336-784-9470   °[facility]  336-285-9073   °[facility]  336-845-3988   °Residential & Outpatient Substance Abuse Program  1-800-659-3381   °Psychological Services °Organization         Address  Phone  Notes  °[facility]  336- 832-9600   °[facility]  336- 378-7881   °[facility] [address], [place] 1-800-853-5163 or 336-641-4981   ° °Mobile Crisis Teams °Organization         Address  Phone  Notes  °[facility], Mobile Crisis Care Unit  1-877-626-1772   °Assertive °[facility] ° [address]  [place], [place] 336-834-9664   °Sharon DeEsch [address] °[place]  336-554-5454   ° °Self-Help/Support Groups °Organization         Address  Phone             Notes  °[facility]  - variety of support groups  336- 373-1402 Call for more information  °Narcotics Anonymous (NA), [facility] [address], °[place]   2 meetings at this location  ° °  Residential Treatment Programs Organization         Address  Phone  Notes  [facility] [address],    [place]  1-562-397-4442   [facility]  [address] [place] [address], [address], [place]   [place], [place] 478-070-3898 Admissions: 8am-3pm M-F  [facility] [address]    [place], [place] 062-694-8546   [facility] [address] [place], [place], [place]   [facility] [address],  [place], [place]   [facility] Intensive Outpatient [address], [place], [place]   [facility] ([address] [place]  [place], [place] 1-5175637012 or (775)192-0865   [facility] ([facility] [address], [place], [place] Accepts Medicaid  [facility] [address],  [place] 1-(361)778-2001 Substance Abuse/Addiction Treatment   [facility]         Address  Phone  Notes  [facility]  904-127-5498   Domenic Schwab, PhD [address] [place], [place]   (514) [phone] or 4047473315   [facility] [place], [place] 503-486-8000   [facility] [address] [place], [place] 408-508-0793 Insurance/Medicaid/sponsorship through [facility] [address]                                    [place], [place] (339) [phone] [place] [address] [place], [place] ([phone]    Dr. Adele Schilder  [phone]   [facility] Dept. 1) [address] [place] 2) [place] 3)  [address], [place] ([phone]  [phone]   [place] 631-225-2204 or [phone] (After Hours)      Take the prescriptions as directed.  Apply moist heat or ice to the area(s) of discomfort, for 15 minutes at a time, several times per day for the next few days.  Do not fall asleep on a heating or ice pack.  Call your regular medical doctor on Monday to schedule a follow up appointment in the next 2 days. Call your mental health provider on Monday to schedule a follow up appointment within the next week. Return to the Emergency Department immediately if worsening.

## 2014-03-15 NOTE — ED Provider Notes (Signed)
CSN: [id number]     Arrival date & time 03/14/14  2318 History   First MD Initiated Contact with Patient 03/15/14 0030     Chief Complaint  Patient presents with  . Panic Attack  . Palpitations  . Chest Pain      HPI Pt was seen at 0030. Per pt, c/o gradual onset and persistence of waxing and waning "panic attack" that began PTA. Pt states her panic attack began when she "had to drive my dad here to get checked out for chest pain." States she took a xanax with partial improvement. Pt states she "feels like the panic attack is trying to come back again." Describes her panic attack as: palpitations, SOB, anxiety. Pt states she has been "under a great deal of stress lately" and "knows I need to get back to my mental health provider." Denies SI, no SA, no HI, no hallucinations. Pt also c/o gradual onset and persistence of constant left sided chest "pain" for the past several weeks, constant over the past 1 week. Pt states the pain worsens with movement of her left arm. Describes the pain as "sharp." States the pain originally started "after I had radiation on my left breast for cancer" in 2013. Denies any change in her usual chronic pain pattern. Denies rash, no fevers, no back pain, no cough, no abd pain, no N/V/D.      Pt drove herself to the ED tonight Past Medical History  Diagnosis Date  . History of bronchitis   . History of sarcoidosis   . Cigarette smoker   . Hyperlipidemia   . Diverticulosis of colon   . Colon polyp   . DJD (degenerative joint disease)   . Vitamin D deficiency   . Panic disorder   . Depression   . History of anemia   . Complication of anesthesia     DIFFICULTY AWAKENING  . Headache(784.0)   . Incisional breast wound     AT AGE 28 BILATERAL INCISION TO BREAST MADE  . Cancer     BREAST - left  . 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 Anxiety     panic disorder  . GERD (gastroesophageal reflux disease)      Pt. denies having GERD. Unable to remove it.  Marland Kitchen DJD (degenerative joint disease)   . Allergy   . Anemia     hx  . Leg swelling   . S/P radiation therapy 08/17/11 - 09/28/11    LLQ - 50 Gy/25 Fractions with Boost of 10 Gy / 5 fractions   Past Surgical History  Procedure Laterality Date  . Total abdominal hysterectomy  2000    Dr Nori Riis  . Trigger finger release  2008    Dr Burney Gauze  . Bunionectomy  2011    bilateral by Dr Little Ishikawa  . Tonsillectomy  1972  . Cyst removed from right hand  1995  . Colonoscopy      polyp  . Breast surgery  07/05/11    left breast lumpectomy with needle loc & axillary sln bx  . Biopsy breast      left breast  as a teenager  benign   Family History  Problem Relation Age of Onset  . Anemia Mother   . Other Mother     + H Pylori  . Prostate cancer Father   . Colon cancer Father     diagnosed 2009  . Cancer Father  colon and prostate cancer  . Breast cancer Sister     #1  . Cancer Sister 30    breast cancer  . Sarcoidosis Sister     #2  . Multiple sclerosis Sister     #2  . Hypertension Brother     #1  . Cancer Brother     prostate cancer  . Prostate cancer Brother     #1  . Hyperlipidemia Brother     #1  . Cancer Cousin     breast cancer   History  Substance Use Topics  . Smoking status: Current Every Day Smoker -- 0.25 packs/day    Types: Cigarettes    Last Attempt to Quit: 04/16/2012  . Smokeless tobacco: Former Systems developer    Quit date: 09/15/2011     Comment: trying to quit with wellbutrin  . Alcohol Use: 0.0 oz/week    0-1 drink(s) per week     Comment: social   OB History    Obstetric Comments   Menarche age 70, nulliparity, HRT x 1 year, Hysterectomy     Review of Systems ROS: Statement: All systems negative except as marked or noted in the HPI; Constitutional: Negative for fever and chills. ; ; Eyes: Negative for eye pain, redness and discharge. ; ; ENMT: Negative for ear pain, hoarseness, nasal congestion, sinus  pressure and sore throat. ; ; Cardiovascular: Negative for diaphoresis, dyspnea and peripheral edema. ; ; Respiratory: Negative for cough, wheezing and stridor. ; ; Gastrointestinal: Negative for nausea, vomiting, diarrhea, abdominal pain, blood in stool, hematemesis, jaundice and rectal bleeding. . ; ; Genitourinary: Negative for dysuria, flank pain and hematuria. ; ; Musculoskeletal: +CP. Negative for back pain and neck pain. Negative for swelling and trauma.; ; Skin: Negative for pruritus, rash, abrasions, blisters, bruising and skin lesion.; ; Neuro: Negative for headache, lightheadedness and neck stiffness. Negative for weakness, altered level of consciousness , altered mental status, extremity weakness, paresthesias, involuntary movement, seizure and syncope.; Psych:  +anxiety, panic attack (palpitations, SOB). No SI, no SA, no HI, no hallucinations.      Allergies  Penicillins  Home Medications   Prior to Admission medications   Medication Sig Start Date End Date Taking? Authorizing Provider  ALPRAZolam Duanne Moron) 0.5 MG tablet Take 1/2 to 1 tablet by mouth three times daily as needed for anxiety 03/25/12  Yes Noralee Space, MD  anastrozole (ARIMIDEX) 1 MG tablet Take 1 mg by mouth daily. 09/29/13  Yes Historical Provider, MD  buPROPion (WELLBUTRIN XL) 150 MG 24 hr tablet TAKE 1 TABLET BY MOUTH EVERY DAY 12/22/13  Yes Minette Headland, NP  Calcium Carbonate-Vit D-Min (CALCIUM 1200) 1200-1000 MG-UNIT CHEW Chew 1,200 mg by mouth daily. 07/30/13  Yes Minette Headland, NP  cholecalciferol (VITAMIN D) 1000 UNITS tablet Take 2,000 Units by mouth daily.    Yes Historical Provider, MD  Clobetasol Propionate 0.05 % lotion Apply to affected areas 2-times per day for 2 weeks 06/09/12  Yes Rosana Hoes, MD  furosemide (LASIX) 20 MG tablet Take 1-2 tablets (20-40 mg total) by mouth daily as needed for edema. 07/07/13  Yes Aleksei Plotnikov V, MD   BP 123/107 mmHg  Pulse 84  Temp(Src) 97.9 F (36.6 C) (Oral)   Resp 18  SpO2 99% Physical Exam  0035: Physical examination:  Nursing notes reviewed; Vital signs and O2 SAT reviewed;  Constitutional: Well developed, Well nourished, Well hydrated, Anxious.; Head:  Normocephalic, atraumatic; Eyes: EOMI, PERRL, No scleral icterus; ENMT: Mouth and  pharynx normal, Mucous membranes moist; Neck: Supple, Full range of motion, No lymphadenopathy; Cardiovascular: Regular rate and rhythm, No murmur, rub, or gallop. Pt c/o palpitations during exam with monitor NSR rate 80's, no ectopy.; Respiratory: Breath sounds clear & equal bilaterally, No rales, rhonchi, wheezes.  Speaking full sentences with ease, Normal respiratory effort/excursion; Chest: Nontender, Movement normal; Abdomen: Soft, Nontender, Nondistended, Normal bowel sounds; Genitourinary: No CVA tenderness; Extremities: Pulses normal, No tenderness, No edema, No calf edema or asymmetry.; Neuro: AA&Ox3, Major CN grossly intact.  Speech clear. No gross focal motor or sensory deficits in extremities.; Skin: Color normal, Warm, Dry.; Psych:  Anxious.    ED Course  Procedures     EKG Interpretation   Date/Time:  Sunday March 15 2014 01:23:37 EST Ventricular Rate:  78 PR Interval:  154 QRS Duration: 69 QT Interval:  369 QTC Calculation: 420 R Axis:   66 Text Interpretation:  Sinus rhythm Normal ECG When compared with ECG of  04/03/2004 No significant change was found Confirmed by MCCMANUS  MD,  Numan Zylstra ([id number]) on 03/15/2014 1:26:54 AM      MDM  MDM Reviewed: previous chart, nursing note and vitals Reviewed previous: labs and ECG Interpretation: labs, ECG and x-ray      Results for orders placed or performed during the hospital encounter of 03/15/14  D-dimer, quantitative  Result Value Ref Range   D-Dimer, Quant 0.35 0.00 - 0.48 ug/mL-FEU  I-stat troponin, ED  Result Value Ref Range   Troponin i, poc 0.00 0.00 - 0.08 ng/mL   Comment 3          I-stat Chem 8, ED  Result Value Ref Range    Sodium 140 135 - 145 mmol/L   Potassium 4.0 3.5 - 5.1 mmol/L   Chloride 106 96 - 112 mEq/L   BUN 12 6 - 23 mg/dL   Creatinine, Ser 0.70 0.50 - 1.10 mg/dL   Glucose, Bld 110 (H) 70 - 99 mg/dL   Calcium, Ion 1.27 1.13 - 1.30 mmol/L   TCO2 20 0 - 100 mmol/L   Hemoglobin 12.9 12.0 - 15.0 g/dL   HCT 38.0 36.0 - 46.0 %   Dg Chest 2 View 03/15/2014   CLINICAL DATA:  Acute onset of palpitations and centralized chest pain. Initial encounter.  EXAM: CHEST  2 VIEW  COMPARISON:  Chest radiograph performed 06/23/2013  FINDINGS: The lungs are well-aerated and clear. There is no evidence of focal opacification, pleural effusion or pneumothorax.  The heart is normal in size; the mediastinal contour is within normal limits. No acute osseous abnormalities are seen. Clips are seen overlying the left breast.  IMPRESSION: No acute cardiopulmonary process seen.   Electronically Signed   By: Jeffery  Chang M.D.   On: 03/15/2014 02:30     04 15:  Pt has been intermittently sleeping since arrival to the ED. States she feels "better now" and wants to go home. Doubt PE as cause for symptoms with normal d-dimer and low risk [place].  Doubt ACS as cause for symptoms with normal troponin and unchanged EKG from previous after 1 week of constant symptoms. Tx symptomatically at this time. Dx and testing d/w pt.  Questions answered.  Verb understanding, agreeable to d/c home with outpt f/u.     Francine Graven, DO 03/18/14 1315

## 2014-03-27 ENCOUNTER — Emergency Department (HOSPITAL_COMMUNITY)
Admission: EM | Admit: 2014-03-27 | Discharge: 2014-03-27 | Disposition: A | Discharge status: Home / Self Care | Payer: PRIVATE HEALTH INSURANCE | Attending: Emergency Medicine | Admitting: Emergency Medicine

## 2014-03-27 ENCOUNTER — Encounter (HOSPITAL_COMMUNITY): Payer: Self-pay

## 2014-03-27 DIAGNOSIS — Z720 Tobacco use: Secondary | ICD-10-CM | POA: Insufficient documentation

## 2014-03-27 DIAGNOSIS — Z853 Personal history of malignant neoplasm of breast: Secondary | ICD-10-CM | POA: Diagnosis not present

## 2014-03-27 DIAGNOSIS — Z9889 Other specified postprocedural states: Secondary | ICD-10-CM | POA: Insufficient documentation

## 2014-03-27 DIAGNOSIS — Z862 Personal history of diseases of the blood and blood-forming organs and certain disorders involving the immune mechanism: Secondary | ICD-10-CM | POA: Insufficient documentation

## 2014-03-27 DIAGNOSIS — D649 Anemia, unspecified: Secondary | ICD-10-CM | POA: Diagnosis not present

## 2014-03-27 DIAGNOSIS — Z8719 Personal history of other diseases of the digestive system: Secondary | ICD-10-CM | POA: Diagnosis not present

## 2014-03-27 DIAGNOSIS — Z880 Allergy status to penicillin: Secondary | ICD-10-CM | POA: Insufficient documentation

## 2014-03-27 DIAGNOSIS — Z79899 Other long term (current) drug therapy: Secondary | ICD-10-CM | POA: Diagnosis not present

## 2014-03-27 DIAGNOSIS — E559 Vitamin D deficiency, unspecified: Secondary | ICD-10-CM | POA: Diagnosis not present

## 2014-03-27 DIAGNOSIS — F410 Panic disorder [episodic paroxysmal anxiety] without agoraphobia: Secondary | ICD-10-CM | POA: Insufficient documentation

## 2014-03-27 DIAGNOSIS — Z923 Personal history of irradiation: Secondary | ICD-10-CM | POA: Diagnosis not present

## 2014-03-27 DIAGNOSIS — E785 Hyperlipidemia, unspecified: Secondary | ICD-10-CM | POA: Insufficient documentation

## 2014-03-27 DIAGNOSIS — Z8709 Personal history of other diseases of the respiratory system: Secondary | ICD-10-CM | POA: Diagnosis not present

## 2014-03-27 DIAGNOSIS — M1990 Unspecified osteoarthritis, unspecified site: Secondary | ICD-10-CM | POA: Insufficient documentation

## 2014-03-27 DIAGNOSIS — Z86010 Personal history of colonic polyps: Secondary | ICD-10-CM | POA: Diagnosis not present

## 2014-03-27 NOTE — ED Notes (Signed)
Dr Beaton at bedside 

## 2014-03-27 NOTE — ED Notes (Addendum)
Pt was at MD and switched medications for her anxiety and had a panic attack earlier today. Also has a cough since Monday but is concerned the medication may not be working.

## 2014-03-27 NOTE — ED Provider Notes (Signed)
CSN: [id number]     Arrival date & time 03/27/14  1737 History   First MD Initiated Contact with Patient 03/27/14 2104     Chief Complaint  Patient presents with  . Panic Attack      HPI Pt was at [facility] and switched medications for her anxiety and had a panic attack earlier today. Also has a cough since Monday but is concerned the medication may not be working Past Medical History  Diagnosis Date  . History of bronchitis   . History of sarcoidosis   . Cigarette smoker   . Hyperlipidemia   . Diverticulosis of colon   . Colon polyp   . DJD (degenerative joint disease)   . Vitamin D deficiency   . Panic disorder   . Depression   . History of anemia   . Complication of anesthesia     DIFFICULTY AWAKENING  . Headache(784.0)   . Incisional breast wound     AT AGE 59 BILATERAL INCISION TO BREAST MADE  . Cancer     BREAST - left  . 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 Anxiety     panic disorder  . GERD (gastroesophageal reflux disease)     Pt. denies having GERD. Unable to remove it.  Marland Kitchen DJD (degenerative joint disease)   . Allergy   . Anemia     hx  . Leg swelling   . S/P radiation therapy 08/17/11 - 09/28/11    LLQ - 50 Gy/25 Fractions with Boost of 10 Gy / 5 fractions   Past Surgical History  Procedure Laterality Date  . Total abdominal hysterectomy  2000    Dr Nori Riis  . Trigger finger release  2008    Dr Burney Gauze  . Bunionectomy  2011    bilateral by Dr Little Ishikawa  . Tonsillectomy  1972  . Cyst removed from right hand  1995  . Colonoscopy      polyp  . Breast surgery  07/05/11    left breast lumpectomy with needle loc & axillary sln bx  . Biopsy breast      left breast  as a teenager  benign   Family History  Problem Relation Age of Onset  . Anemia Mother   . Other Mother     + H Pylori  . Prostate cancer Father   . Colon cancer Father     diagnosed 2009  . Cancer Father     colon and prostate cancer  .  Breast cancer Sister     #1  . Cancer Sister 30    breast cancer  . Sarcoidosis Sister     #2  . Multiple sclerosis Sister     #2  . Hypertension Brother     #1  . Cancer Brother     prostate cancer  . Prostate cancer Brother     #1  . Hyperlipidemia Brother     #1  . Cancer Cousin     breast cancer   History  Substance Use Topics  . Smoking status: Current Every Day Smoker -- 0.25 packs/day    Types: Cigarettes    Last Attempt to Quit: 04/16/2012  . Smokeless tobacco: Former Systems developer    Quit date: 09/15/2011     Comment: trying to quit with wellbutrin  . Alcohol Use: 0.0 oz/week    0-1 drink(s) per week     Comment: social   OB History  Obstetric Comments   Menarche age 44, nulliparity, HRT x 1 year, Hysterectomy     Review of Systems  All other systems reviewed and are negative  Allergies  Penicillins  Home Medications   Prior to Admission medications   Medication Sig Start Date End Date Taking? Authorizing Provider  ALPRAZolam Duanne Moron) 0.5 MG tablet Take 1/2 to 1 tablet by mouth three times daily as needed for anxiety Patient taking differently: Take 0.5-1 mg by mouth 3 (three) times daily as needed for anxiety. Take 1/2 to 1 tablet by mouth three times daily as needed for anxiety 03/25/12  Yes Noralee Space, MD  anastrozole (ARIMIDEX) 1 MG tablet Take 1 mg by mouth daily. 09/29/13  Yes Historical Provider, MD  buPROPion (WELLBUTRIN XL) 150 MG 24 hr tablet TAKE 1 TABLET BY MOUTH EVERY DAY 12/22/13  Yes Minette Headland, NP  busPIRone (BUSPAR) 5 MG tablet Take 5 mg by mouth 3 (three) times daily.   Yes Historical Provider, MD  cholecalciferol (VITAMIN D) 1000 UNITS tablet Take 2,000 Units by mouth daily.    Yes Historical Provider, MD  Clobetasol Propionate 0.05 % lotion Apply to affected areas 2-times per day for 2 weeks Patient taking differently: Apply 1 application topically as needed (for itching / rash). Apply to affected areas 2-times per day for 2 weeks  06/09/12  Yes Rosana Hoes, MD  Multiple Vitamin (MULTIVITAMIN WITH MINERALS) TABS tablet Take 1 tablet by mouth daily.   Yes Historical Provider, MD  vitamin C (ASCORBIC ACID) 500 MG tablet Take 2,000 mg by mouth as needed (for immune support).   Yes Historical Provider, MD  Calcium Carbonate-Vit D-Min (CALCIUM 1200) 1200-1000 MG-UNIT CHEW Chew 1,200 mg by mouth daily. 07/30/13   Minette Headland, NP  furosemide (LASIX) 20 MG tablet Take 1-2 tablets (20-40 mg total) by mouth daily as needed for edema. 07/07/13   Aleksei Plotnikov V, MD  HYDROcodone-acetaminophen (NORCO/VICODIN) 5-325 MG per tablet 1 or 2 tabs PO q6 hours prn pain 03/15/14   Francine Graven, DO  methocarbamol (ROBAXIN) 500 MG tablet Take 2 tablets (1,000 mg total) by mouth 4 (four) times daily as needed for muscle spasms (muscle spasm/pain). 03/15/14   Francine Graven, DO   BP 148/99 mmHg  Pulse 88  Temp(Src) 98.7 F (37.1 C) (Oral)  Resp 18  SpO2 99% Physical Exam  Constitutional: She is oriented to person, place, and time. She appears well-developed and well-nourished. No distress.  HENT:  Head: Normocephalic and atraumatic.  Eyes: Pupils are equal, round, and reactive to light.  Neck: Normal range of motion.  Cardiovascular: Normal rate and intact distal pulses.   Pulmonary/Chest: No respiratory distress. She has no wheezes.  Abdominal: Normal appearance. She exhibits no distension. There is no tenderness. There is no rebound.  Musculoskeletal: Normal range of motion.  Neurological: She is alert and oriented to person, place, and time. No cranial nerve deficit. Coordination normal.  Skin: Skin is warm and dry. No rash noted.  Psychiatric: She has a normal mood and affect. Her behavior is normal. Judgment and thought content normal.  Nursing note and vitals reviewed.   ED Course  Procedures (including critical care time)    After treatment in the ED the patient feels back to baseline and wants to go home.  MDM    Final diagnoses:  Panic anxiety syndrome        Dot Lanes, MD 03/27/14 2221

## 2014-03-27 NOTE — ED Notes (Signed)
Pt A&OX4, ambulatory at d/c with steady gait, NAD 

## 2014-03-27 NOTE — Discharge Instructions (Signed)

## 2014-05-19 ENCOUNTER — Encounter: Payer: Self-pay | Admitting: *Deleted

## 2014-05-19 ENCOUNTER — Ambulatory Visit (HOSPITAL_BASED_OUTPATIENT_CLINIC_OR_DEPARTMENT_OTHER): Payer: PRIVATE HEALTH INSURANCE | Admitting: Hematology and Oncology

## 2014-05-19 ENCOUNTER — Other Ambulatory Visit: Payer: Self-pay

## 2014-05-19 VITALS — BP 141/82 | HR 79 | Temp 97.9°F | Resp 18 | Ht 65.0 in | Wt 219.4 lb

## 2014-05-19 DIAGNOSIS — Z853 Personal history of malignant neoplasm of breast: Secondary | ICD-10-CM

## 2014-05-19 DIAGNOSIS — Z1231 Encounter for screening mammogram for malignant neoplasm of breast: Secondary | ICD-10-CM

## 2014-05-19 DIAGNOSIS — C50412 Malignant neoplasm of upper-outer quadrant of left female breast: Secondary | ICD-10-CM

## 2014-05-19 NOTE — Progress Notes (Signed)
Patient Care Team: Cassandria Anger, MD as PCP - General (Internal Medicine) Jesusita Oka, RN as Registered Nurse Inda Castle, MD as Consulting Physician (Gastroenterology) Maisie Fus, MD as Consulting Physician (Obstetrics and Gynecology) Consuela Mimes, MD as Consulting Physician (Oncology)  DIAGNOSIS: Breast cancer of upper-outer quadrant of left female breast   Staging form: Breast, AJCC 7th Edition     Clinical: Stage IA (T1c, N0, cM0) - Unsigned       Staging comments: Staged In Breast Conference  4.17.13      Pathologic: No stage assigned - Unsigned   SUMMARY OF ONCOLOGIC HISTORY:   Breast cancer of upper-outer quadrant of left female breast   06/20/2011 Surgery Left breast lumpectomy with sentinel lymph node biopsy: 1 cm IDC, grade 1, ER positive, PR positive, HER-2 negative, Ki-67 6%   08/17/2011 - 09/28/2011 Radiation Therapy Adjuvant radiation therapy   10/02/2011 -  Anti-estrogen oral therapy Arimidex 1 mg daily started 10/02/2011 stopped March 2015 for pain and restarted August 2015    CHIEF COMPLIANT: Follow-up on Arimidex for breast cancer  INTERVAL HISTORY: Marilyn [name] is a 59 year old lady with above-mentioned history of left-sided breast cancer treated with lumpectomy and radiation and is currently on oral antiestrogen therapy with Arimidex. She'll resume Arimidex August 2015 after being off for 5 months. Since she resume she is not experiencing severe side effects that she had before. She does have mild aches and pains but not severe. She is trying to lose weight and is on a diet currently. Patient complains of aching pain in the left axilla when she uses her arm to stretch or bend forward.  REVIEW OF SYSTEMS:   Constitutional: Denies fevers, chills or abnormal weight loss Eyes: Denies blurriness of vision Ears, nose, mouth, throat, and face: Denies mucositis or sore throat Respiratory: Denies cough, dyspnea or wheezes Cardiovascular: Denies palpitation,  chest discomfort or lower extremity swelling Gastrointestinal:  Denies nausea, heartburn or change in bowel habits Skin: Denies abnormal skin rashes Lymphatics: Denies new lymphadenopathy or easy bruising Neurological:Denies numbness, tingling or new weaknesses Behavioral/Psych: Mood is stable, no new changes  Breast:  denies any pain or lumps or nodules in either breasts All other systems were reviewed with the patient and are negative.  I have reviewed the past medical history, past surgical history, social history and family history with the patient and they are unchanged from previous note.  ALLERGIES:  is allergic to penicillins.  MEDICATIONS:  Current Outpatient Prescriptions  Medication Sig Dispense Refill  . ALPRAZolam (XANAX) 0.5 MG tablet Take 1/2 to 1 tablet by mouth three times daily as needed for anxiety (Patient taking differently: Take 0.5-1 mg by mouth 3 (three) times daily as needed for anxiety. Take 1/2 to 1 tablet by mouth three times daily as needed for anxiety) 90 tablet 5  . anastrozole (ARIMIDEX) 1 MG tablet Take 1 mg by mouth daily.    Marland Kitchen buPROPion (WELLBUTRIN XL) 150 MG 24 hr tablet TAKE 1 TABLET BY MOUTH EVERY DAY 90 tablet 1  . busPIRone (BUSPAR) 10 MG tablet Take 10 mg by mouth 3 (three) times daily.  2  . cholecalciferol (VITAMIN D) 1000 UNITS tablet Take 2,000 Units by mouth daily.     . Clobetasol Propionate 0.05 % lotion Apply to affected areas 2-times per day for 2 weeks (Patient taking differently: Apply 1 application topically as needed (for itching / rash). Apply to affected areas 2-times per day for 2 weeks) 118 mL 1  .  Multiple Vitamin (MULTIVITAMIN WITH MINERALS) TABS tablet Take 1 tablet by mouth daily.    . busPIRone (BUSPAR) 5 MG tablet Take 5 mg by mouth 3 (three) times daily.    . Calcium Carbonate-Vit D-Min (CALCIUM 1200) 1200-1000 MG-UNIT CHEW Chew 1,200 mg by mouth daily. (Patient not taking: Reported on 05/19/2014) 30 each 11  . furosemide  (LASIX) 20 MG tablet Take 1-2 tablets (20-40 mg total) by mouth daily as needed for edema. (Patient not taking: Reported on 05/19/2014) 60 tablet 3  . HYDROcodone-acetaminophen (NORCO/VICODIN) 5-325 MG per tablet 1 or 2 tabs PO q6 hours prn pain (Patient not taking: Reported on 05/19/2014) 20 tablet 0  . methocarbamol (ROBAXIN) 500 MG tablet Take 2 tablets (1,000 mg total) by mouth 4 (four) times daily as needed for muscle spasms (muscle spasm/pain). (Patient not taking: Reported on 05/19/2014) 25 tablet 0  . vitamin C (ASCORBIC ACID) 500 MG tablet Take 2,000 mg by mouth as needed (for immune support).     No current facility-administered medications for this visit.    PHYSICAL EXAMINATION: ECOG PERFORMANCE STATUS: 1 - Symptomatic but completely ambulatory  Filed Vitals:   05/19/14 0956  BP: 141/82  Pulse: 79  Temp: 97.9 F (36.6 C)  Resp: 18   Filed Weights   05/19/14 0956  Weight: 219 lb 6.4 oz (99.519 kg)    GENERAL:alert, no distress and comfortable SKIN: skin color, texture, turgor are normal, no rashes or significant lesions EYES: normal, Conjunctiva are pink and non-injected, sclera clear OROPHARYNX:no exudate, no erythema and lips, buccal mucosa, and tongue normal  NECK: supple, thyroid normal size, non-tender, without nodularity LYMPH:  no palpable lymphadenopathy in the cervical, axillary or inguinal LUNGS: clear to auscultation and percussion with normal breathing effort HEART: regular rate & rhythm and no murmurs and no lower extremity edema ABDOMEN:abdomen soft, non-tender and normal bowel sounds Musculoskeletal:no cyanosis of digits and no clubbing  NEURO: alert & oriented x 3 with fluent speech, no focal motor/sensory deficits BREAST: No palpable masses or nodules in either right or left breasts. No palpable axillary supraclavicular or infraclavicular adenopathy no breast tenderness or nipple discharge. (exam performed in the presence of a chaperone)  LABORATORY DATA:   I have reviewed the data as listed   Chemistry      Component Value Date/Time   NA 140 03/15/2014 0123   NA 138 11/04/2013 0929   K 4.0 03/15/2014 0123   K 3.8 11/04/2013 0929   CL 106 03/15/2014 0123   CL 107 03/22/2012 0853   CO2 23 11/04/2013 0929   CO2 27 07/07/2013 1626   BUN 12 03/15/2014 0123   BUN 12.1 11/04/2013 0929   CREATININE 0.70 03/15/2014 0123   CREATININE 0.8 11/04/2013 0929      Component Value Date/Time   CALCIUM 9.6 11/04/2013 0929   CALCIUM 9.8 07/07/2013 1626   ALKPHOS 108 11/04/2013 0929   ALKPHOS 80 06/17/2012 1058   AST 12 11/04/2013 0929   AST 13 06/17/2012 1058   ALT 13 11/04/2013 0929   ALT 17 06/17/2012 1058   BILITOT 0.28 11/04/2013 0929   BILITOT 0.6 06/17/2012 1058       Lab Results  Component Value Date   WBC 7.7 11/04/2013   HGB 12.9 03/15/2014   HCT 38.0 03/15/2014   MCV 74.7* 11/04/2013   PLT 247 11/04/2013   NEUTROABS 5.2 11/04/2013    ASSESSMENT & PLAN:  Breast cancer of upper-outer quadrant of left female breast 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 Plan to treat her until December 2018.  Arimidex toxicities 1. Mild muscle aches and pains 2. Bone density done 2015 showed a T score -1.3 osteopenia: Continue with calcium and vitamin D and encouraged her to eat more yogurt. She takes vitamin D supplementation.  Breast cancer surveillance: 1. Breast exam 05/17/2014 is without any abnormalities 2. Mammograms to be done once a year she is going to schedule it next month.  Survivorship:Discussed the importance of physical exercise in decreasing the likelihood of breast cancer recurrence. Recommended 30 mins daily 6  days a week of either brisk walking or cycling or swimming. Encouraged patient to eat more fruits and vegetables and decrease red meat.    Based on current guidelines, she does not need routine blood work from [place]. She will get her standard annual blood work with her primary care physician.  No orders of the defined types were placed in this encounter.   The patient has a good understanding of the overall plan. she agrees with it. She will call with any problems that may develop before her next visit here.   Rulon Eisenmenger, MD

## 2014-05-19 NOTE — Assessment & Plan Note (Signed)
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 Plan to treat her until December 2018.  Arimidex toxicities 1. Mild muscle aches and pains 2. Bone density done 2015 showed a T score -1.3 osteopenia: Continue with calcium and vitamin D and encouraged her to eat more yogurt. She takes vitamin D supplementation.  Breast cancer surveillance: 1. Breast exam 05/17/2014 is without any abnormalities 2. Mammograms to be done once a year she is going to schedule it next month.  Survivorship:Discussed the importance of physical exercise in decreasing the likelihood of breast cancer recurrence. Recommended 30 mins daily 6 days a week of either brisk walking or cycling or swimming. Encouraged patient to eat more fruits and vegetables and decrease red meat.

## 2014-05-19 NOTE — [facility] Oncology Navigator Note (Signed)
Patient at [facility] for f/u appointment with Dr. Lindi Adie.  She reports that she is doing well.  She continues to take and tolerate Arimidex with mild joint aches and pains but much improved following a break in therapy and resuming in August, 2015.  She reports that she has been experiencing a lot of stress related to family illnesses and demands resulting in feelings of anxiety.  She was seen in the ED for panic attacks and prescribed Buspar which has been very effective.  She denied any questions or other concerns at this time.  I gave her my contact information and encouraged her to call me for any needs.

## 2014-05-26 ENCOUNTER — Emergency Department (INDEPENDENT_AMBULATORY_CARE_PROVIDER_SITE_OTHER)
Admission: EM | Admit: 2014-05-26 | Discharge: 2014-05-26 | Disposition: A | Discharge status: Home / Self Care | Payer: PRIVATE HEALTH INSURANCE | Source: Home / Self Care | Attending: Family Medicine | Admitting: Family Medicine

## 2014-05-26 ENCOUNTER — Encounter (HOSPITAL_COMMUNITY): Payer: Self-pay | Admitting: Emergency Medicine

## 2014-05-26 DIAGNOSIS — I889 Nonspecific lymphadenitis, unspecified: Secondary | ICD-10-CM

## 2014-05-26 MED ORDER — CLINDAMYCIN HCL 300 MG PO CAPS: 300.0000 mg | ORAL_CAPSULE | Freq: Three times a day (TID) | ORAL | Status: DC

## 2014-05-26 NOTE — Discharge Instructions (Signed)
Cervical Adenitis Warm compresses You have a swollen lymph gland in your neck. This commonly happens with Strep and virus infections, dental problems, insect bites, and injuries about the face, scalp, or neck. The lymph glands swell as the body fights the infection or heals the injury. Swelling and firmness typically lasts for several weeks after the infection or injury is healed. Rarely lymph glands can become swollen because of cancer or TB. Antibiotics are prescribed if there is evidence of an infection. Sometimes an infected lymph gland becomes filled with pus. This condition may require opening up the abscessed gland by draining it surgically. Most of the time infected glands return to normal within two weeks. Do not poke or squeeze the swollen lymph nodes. That may keep them from shrinking back to their normal size. If the lymph gland is still swollen after 2 weeks, further medical evaluation is needed.  SEEK IMMEDIATE MEDICAL CARE IF:  You have difficulty swallowing or breathing, increased swelling, severe pain, or a high fever.  Document Released: 02/13/2005 Document Revised: 05/08/2011 Document Reviewed: 08/05/2006 [facility] Patient Information 2015 [place], [place]. This information is not intended to replace advice given to you by your health care provider. Make sure you discuss any questions you have with your health care provider.  Swollen Lymph Nodes The lymphatic system filters fluid from around cells. It is like a system of blood vessels. These channels carry lymph instead of blood. The lymphatic system is an important part of the immune (disease fighting) system. When people talk about "swollen glands in the neck," they are usually talking about swollen lymph nodes. The lymph nodes are like the little traps for infection. You and your caregiver may be able to feel lymph nodes, especially swollen nodes, in these common areas: the groin (inguinal area), armpits (axilla), and above the clavicle  (supraclavicular). You may also feel them in the neck (cervical) and the back of the head just above the hairline (occipital). Swollen glands occur when there is any condition in which the body responds with an allergic type of reaction. For instance, the glands in the neck can become swollen from insect bites or any type of minor infection on the head. These are very noticeable in children with only minor problems. Lymph nodes may also become swollen when there is a tumor or problem with the lymphatic system, such as Hodgkin's disease. TREATMENT   Most swollen glands do not require treatment. They can be observed (watched) for a short period of time, if your caregiver feels it is necessary. Most of the time, observation is not necessary.  Antibiotics (medicines that kill germs) may be prescribed by your caregiver. Your caregiver may prescribe these if he or she feels the swollen glands are due to a bacterial (germ) infection. Antibiotics are not used if the swollen glands are caused by a virus. HOME CARE INSTRUCTIONS   Take medications as directed by your caregiver. Only take over-the-counter or prescription medicines for pain, discomfort, or fever as directed by your caregiver. SEEK MEDICAL CARE IF:   If you begin to run a temperature greater than 102 F (38.9 C), or as your caregiver suggests. MAKE SURE YOU:   Understand these instructions.  Will watch your condition.  Will get help right away if you are not doing well or get worse. Document Released: 02/03/2002 Document Revised: 05/08/2011 Document Reviewed: 02/13/2005 [facility] Patient Information 2015 [place], [place]. This information is not intended to replace advice given to you by your health care provider. Make  sure you discuss any questions you have with your health care provider.  Lymphadenopathy Lymphadenopathy means "disease of the lymph glands." But the term is usually used to describe swollen or enlarged lymph glands, also  called lymph nodes. These are the bean-shaped organs found in many locations including the neck, underarm, and groin. Lymph glands are part of the immune system, which fights infections in your body. Lymphadenopathy can occur in just one area of the body, such as the neck, or it can be generalized, with lymph node enlargement in several areas. The nodes found in the neck are the most common sites of lymphadenopathy. CAUSES When your immune system responds to germs (such as viruses or bacteria ), infection-fighting cells and fluid build up. This causes the glands to grow in size. Usually, this is not something to worry about. Sometimes, the glands themselves can become infected and inflamed. This is called lymphadenitis. Enlarged lymph nodes can be caused by many diseases:  Bacterial disease, such as strep throat or a skin infection.  Viral disease, such as a common cold.  Other germs, such as Lyme disease, tuberculosis, or sexually transmitted diseases.  Cancers, such as lymphoma (cancer of the lymphatic system) or leukemia (cancer of the white blood cells).  Inflammatory diseases such as lupus or rheumatoid arthritis.  Reactions to medications. Many of the diseases above are rare, but important. This is why you should see your caregiver if you have lymphadenopathy. SYMPTOMS  Swollen, enlarged lumps in the neck, back of the head, or other locations.  Tenderness.  Warmth or redness of the skin over the lymph nodes.  Fever. DIAGNOSIS Enlarged lymph nodes are often near the source of infection. They can help health care providers diagnose your illness. For instance:  Swollen lymph nodes around the jaw might be caused by an infection in the mouth.  Enlarged glands in the neck often signal a throat infection.  Lymph nodes that are swollen in more than one area often indicate an illness caused by a virus. Your caregiver will likely know what is causing your lymphadenopathy after listening  to your history and examining you. Blood tests, x-rays, or other tests may be needed. If the cause of the enlarged lymph node cannot be found, and it does not go away by itself, then a biopsy may be needed. Your caregiver will discuss this with you. TREATMENT Treatment for your enlarged lymph nodes will depend on the cause. Many times the nodes will shrink to normal size by themselves, with no treatment. Antibiotics or other medicines may be needed for infection. Only take over-the-counter or prescription medicines for pain, discomfort, or fever as directed by your caregiver. HOME CARE INSTRUCTIONS Swollen lymph glands usually return to normal when the underlying medical condition goes away. If they persist, contact your health-care provider. He/she might prescribe antibiotics or other treatments, depending on the diagnosis. Take any medications exactly as prescribed. Keep any follow-up appointments made to check on the condition of your enlarged nodes. SEEK MEDICAL CARE IF:  Swelling lasts for more than two weeks.  You have symptoms such as weight loss, night sweats, fatigue, or fever that does not go away.  The lymph nodes are hard, seem fixed to the skin, or are growing rapidly.  Skin over the lymph nodes is red and inflamed. This could mean there is an infection. SEEK IMMEDIATE MEDICAL CARE IF:  Fluid starts leaking from the area of the enlarged lymph node.  You develop a fever of 102 F (38.9  C) or greater.  Severe pain develops (not necessarily at the site of a large lymph node).  You develop chest pain or shortness of breath.  You develop worsening abdominal pain. MAKE SURE YOU:  Understand these instructions.  Will watch your condition.  Will get help right away if you are not doing well or get worse. Document Released: 11/23/2007 Document Revised: 06/30/2013 Document Reviewed: 11/23/2007 [facility] Patient Information 2015 [place], [place]. This information is not intended to  replace advice given to you by your health care provider. Make sure you discuss any questions you have with your health care provider.

## 2014-05-26 NOTE — ED Notes (Signed)
Patient complains of swollen left gland and a dry cough.  Reports cough since January 2016.  Reports swollen gland since yesterday

## 2014-05-26 NOTE — ED Provider Notes (Signed)
CSN: [id number]     Arrival date & time 05/26/14  1507 History   First MD Initiated Contact with Patient 05/26/14 1712     Chief Complaint  Patient presents with  . Cough   (Consider location/radiation/quality/duration/timing/severity/associated sxs/prior Treatment) HPI Comments: 59 year old female with noticed a knot in the left lateral anterior neck yesterday. She states it is mildly tender. Denies other areas of "knots". Denies fever, sore throat, fatigue or malaise or GI symptoms. She states at one time she ate a salted tangerine and the "knot" went away for about 8 hours and then came back. She states there is a feeling in the mouth is that she may have bit her gum. She visited her dentist about 2 weeks ago and was given a healthy dental report. Denies dental pain.   Past Medical History  Diagnosis Date  . History of bronchitis   . History of sarcoidosis   . Cigarette smoker   . Hyperlipidemia   . Diverticulosis of colon   . Colon polyp   . DJD (degenerative joint disease)   . Vitamin D deficiency   . Panic disorder   . Depression   . History of anemia   . Complication of anesthesia     DIFFICULTY AWAKENING  . Headache(784.0)   . Incisional breast wound     AT AGE 12 BILATERAL INCISION TO BREAST MADE  . Cancer     BREAST - left  . 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 Anxiety     panic disorder  . GERD (gastroesophageal reflux disease)     Pt. denies having GERD. Unable to remove it.  Marland Kitchen DJD (degenerative joint disease)   . Allergy   . Anemia     hx  . Leg swelling   . S/P radiation therapy 08/17/11 - 09/28/11    LLQ - 50 Gy/25 Fractions with Boost of 10 Gy / 5 fractions   Past Surgical History  Procedure Laterality Date  . Total abdominal hysterectomy  2000    Dr Nori Riis  . Trigger finger release  2008    Dr Burney Gauze  . Bunionectomy  2011    bilateral by Dr Little Ishikawa  . Tonsillectomy  1972  . Cyst removed from  right hand  1995  . Colonoscopy      polyp  . Breast surgery  07/05/11    left breast lumpectomy with needle loc & axillary sln bx  . Biopsy breast      left breast  as a teenager  benign   Family History  Problem Relation Age of Onset  . Anemia Mother   . Other Mother     + H Pylori  . Prostate cancer Father   . Colon cancer Father     diagnosed 2009  . Cancer Father     colon and prostate cancer  . Breast cancer Sister     #1  . Cancer Sister 30    breast cancer  . Sarcoidosis Sister     #2  . Multiple sclerosis Sister     #2  . Hypertension Brother     #1  . Cancer Brother     prostate cancer  . Prostate cancer Brother     #1  . Hyperlipidemia Brother     #1  . Cancer Cousin     breast cancer   History  Substance Use Topics  . Smoking status: Current Every  Day Smoker -- 0.25 packs/day    Types: Cigarettes    Last Attempt to Quit: 04/16/2012  . Smokeless tobacco: Current User    Last Attempt to Quit: 09/15/2011     Comment: trying to quit with wellbutrin  . Alcohol Use: 0.0 oz/week    0-1 Standard drinks or equivalent per week     Comment: social   OB History    Obstetric Comments   Menarche age 58, nulliparity, HRT x 1 year, Hysterectomy     Review of Systems  Constitutional: Negative for fever, chills, diaphoresis, activity change and fatigue.  HENT: Positive for ear pain, postnasal drip and sore throat. Negative for congestion, ear discharge, rhinorrhea, sinus pressure, sneezing and trouble swallowing.   Eyes: Negative for pain and visual disturbance.  Respiratory: Negative.   Gastrointestinal: Negative.   Skin: Negative.   Neurological: Negative.     Allergies  Penicillins  Home Medications   Prior to Admission medications   Medication Sig Start Date End Date Taking? Authorizing Provider  ALPRAZolam Duanne Moron) 0.5 MG tablet Take 1/2 to 1 tablet by mouth three times daily as needed for anxiety Patient taking differently: Take 0.5-1 mg by mouth 3  (three) times daily as needed for anxiety. Take 1/2 to 1 tablet by mouth three times daily as needed for anxiety 03/25/12   Noralee Space, MD  anastrozole (ARIMIDEX) 1 MG tablet Take 1 mg by mouth daily. 09/29/13   Historical Provider, MD  buPROPion (WELLBUTRIN XL) 150 MG 24 hr tablet TAKE 1 TABLET BY MOUTH EVERY DAY 12/22/13   Minette Headland, NP  busPIRone (BUSPAR) 10 MG tablet Take 10 mg by mouth 3 (three) times daily. 04/21/14   Historical Provider, MD  busPIRone (BUSPAR) 5 MG tablet Take 5 mg by mouth 3 (three) times daily.    Historical Provider, MD  cholecalciferol (VITAMIN D) 1000 UNITS tablet Take 2,000 Units by mouth daily.     Historical Provider, MD  clindamycin (CLEOCIN) 300 MG capsule Take 1 capsule (300 mg total) by mouth 3 (three) times daily. 05/26/14   Janne Napoleon, NP  Clobetasol Propionate 0.05 % lotion Apply to affected areas 2-times per day for 2 weeks Patient taking differently: Apply 1 application topically as needed (for itching / rash). Apply to affected areas 2-times per day for 2 weeks 06/09/12   Rosana Hoes, MD  Multiple Vitamin (MULTIVITAMIN WITH MINERALS) TABS tablet Take 1 tablet by mouth daily.    Historical Provider, MD  vitamin C (ASCORBIC ACID) 500 MG tablet Take 2,000 mg by mouth as needed (for immune support).    Historical Provider, MD   BP 139/90 mmHg  Pulse 74  Temp(Src) 98.3 F (36.8 C) (Oral)  Resp 12  SpO2 95% Physical Exam  Constitutional: She is oriented to person, place, and time. She appears well-developed and well-nourished. No distress.  HENT:  Head: Normocephalic and atraumatic.  Bilateral TMs are pearly gray, transparent, no erythema or other discoloration, no apparent effusion or retraction. Oropharynx with minor erythema appearing more as an irritation and scant clear PND. Does not appear to be infected. No tonsillitis or exudates. No intraoral lesions are seen. There is no swelling, no dental tenderness. No gingival swelling or erythema. No  tenderness or swelling of the buccal mucosa. No evidence of sialoadenitis.  Eyes: EOM are normal. Pupils are equal, round, and reactive to light.  Neck: Normal range of motion. Neck supple.  There is a solitary enlarged and tender lymph node in the  left anterior cervical chain. Measures approximately 2-1/2 cm. It is mobile. No other surrounding nodes are palpable or observed.  Cardiovascular: Normal rate, regular rhythm and normal heart sounds.   Pulmonary/Chest: Effort normal and breath sounds normal. No respiratory distress.  Lymphadenopathy:    She has cervical adenopathy.  Neurological: She is alert and oriented to person, place, and time. She exhibits normal muscle tone.  Skin: Skin is warm and dry.  Psychiatric: She has a normal mood and affect.  Nursing note and vitals reviewed.   ED Course  Procedures (including critical care time) Labs Review Labs Reviewed - No data to display  Imaging Review No results found.   MDM   1. Cervical lymphadenitis    Warm compresses Clindamycin tid See your PCP later this week, esp if worse or not improving.    Janne Napoleon, NP 05/26/14 1740

## 2014-05-29 LAB — HM PAP SMEAR

## 2014-06-02 ENCOUNTER — Encounter: Payer: Self-pay | Admitting: Internal Medicine

## 2014-06-02 ENCOUNTER — Ambulatory Visit (INDEPENDENT_AMBULATORY_CARE_PROVIDER_SITE_OTHER): Payer: PRIVATE HEALTH INSURANCE | Admitting: Internal Medicine

## 2014-06-02 VITALS — BP 118/74 | HR 77 | Temp 98.4°F | Resp 16 | Wt 220.0 lb

## 2014-06-02 DIAGNOSIS — K1120 Sialoadenitis, unspecified: Secondary | ICD-10-CM | POA: Diagnosis not present

## 2014-06-02 NOTE — Assessment & Plan Note (Signed)
4/16 L jaw Finish abx Massage Lemon water ENT ref

## 2014-06-02 NOTE — Patient Instructions (Signed)
Lemon water

## 2014-06-02 NOTE — Progress Notes (Signed)
Pre visit review using our clinic review tool, if applicable. No additional management support is needed unless otherwise documented below in the visit note. 

## 2014-06-02 NOTE — Progress Notes (Signed)
   Subjective:    HPI    C/o gland swelling L side of the lower jaw, worse w/eating. Pt went to [facility]  - on Cl[place] 1 d left  Side effects w/Arimidex since 09/2011- arthralgias, swelling  Wt Readings from Last 3 Encounters:  06/02/14 220 lb (99.791 kg)  05/19/14 219 lb 6.4 oz (99.519 kg)  01/07/14 235 lb 0.6 oz (106.613 kg)   BP Readings from Last 3 Encounters:  06/02/14 118/74  05/26/14 139/90  05/19/14 141/82       Review of Systems  Constitutional: Positive for unexpected weight change. Negative for chills, activity change, appetite change and fatigue.  HENT: Negative for congestion, mouth sores and sinus pressure.   Eyes: Negative for visual disturbance.  Respiratory: Negative for cough, chest tightness and wheezing.   Gastrointestinal: Negative for nausea and abdominal pain.  Genitourinary: Negative for frequency, difficulty urinating and vaginal pain.  Musculoskeletal: Negative for myalgias, back pain and gait problem.  Skin: Negative for pallor and rash.  Neurological: Negative for dizziness, tremors, weakness, numbness and headaches.  Psychiatric/Behavioral: Negative for suicidal ideas, confusion and sleep disturbance. The patient is nervous/anxious.        Objective:   Physical Exam  Constitutional: She appears well-developed. No distress.  HENT:  Head: Normocephalic.  Right Ear: External ear normal.  Left Ear: External ear normal.  Nose: Nose normal.  Mouth/Throat: Oropharynx is clear and moist.  Eyes: Conjunctivae are normal. Pupils are equal, round, and reactive to light. Right eye exhibits no discharge. Left eye exhibits no discharge.  Neck: Normal range of motion. Neck supple. No JVD present. No tracheal deviation present. No thyromegaly present.  Cardiovascular: Normal rate, regular rhythm and normal heart sounds.   Pulmonary/Chest: No stridor. No respiratory distress. She has no wheezes.  Abdominal: Soft. Bowel sounds are normal. She exhibits no  distension and no mass. There is no tenderness. There is no rebound and no guarding.  Musculoskeletal: She exhibits no edema or tenderness.  Lymphadenopathy:    She has no cervical adenopathy.  Neurological: She displays normal reflexes. No cranial nerve deficit. She exhibits normal muscle tone. Coordination normal.  Skin: No rash noted. No erythema.  Psychiatric: She has a normal mood and affect. Her behavior is normal. Judgment and thought content normal.  trace edema B L lower jaw salivary gland is tender, swollen   Lab Results  Component Value Date   WBC 7.7 11/04/2013   HGB 12.9 03/15/2014   HCT 38.0 03/15/2014   PLT 247 11/04/2013   GLUCOSE 110* 03/15/2014   CHOL 189 11/04/2013   TRIG 145 11/04/2013   HDL 55 11/04/2013   LDLDIRECT 118.9 12/18/2011   LDLCALC 105* 11/04/2013   ALT 13 11/04/2013   AST 12 11/04/2013   NA 140 03/15/2014   K 4.0 03/15/2014   CL 106 03/15/2014   CREATININE 0.70 03/15/2014   BUN 12 03/15/2014   CO2 23 11/04/2013   TSH 1.02 07/07/2013         Assessment & Plan:

## 2014-06-15 ENCOUNTER — Other Ambulatory Visit: Payer: Self-pay | Admitting: Internal Medicine

## 2014-06-15 ENCOUNTER — Ambulatory Visit
Admission: RE | Admit: 2014-06-15 | Discharge: 2014-06-15 | Disposition: A | Discharge status: Home / Self Care | Payer: PRIVATE HEALTH INSURANCE | Source: Ambulatory Visit | Attending: Internal Medicine | Admitting: Internal Medicine

## 2014-06-15 ENCOUNTER — Other Ambulatory Visit: Payer: Self-pay | Admitting: *Deleted

## 2014-06-15 ENCOUNTER — Ambulatory Visit
Admission: RE | Admit: 2014-06-15 | Discharge: 2014-06-15 | Disposition: A | Discharge status: Home / Self Care | Payer: PRIVATE HEALTH INSURANCE | Source: Ambulatory Visit

## 2014-06-15 DIAGNOSIS — Z1231 Encounter for screening mammogram for malignant neoplasm of breast: Secondary | ICD-10-CM

## 2014-06-15 DIAGNOSIS — Z853 Personal history of malignant neoplasm of breast: Secondary | ICD-10-CM

## 2014-06-15 LAB — HM MAMMOGRAPHY

## 2014-06-17 ENCOUNTER — Ambulatory Visit (INDEPENDENT_AMBULATORY_CARE_PROVIDER_SITE_OTHER): Payer: PRIVATE HEALTH INSURANCE

## 2014-06-17 ENCOUNTER — Ambulatory Visit (INDEPENDENT_AMBULATORY_CARE_PROVIDER_SITE_OTHER): Payer: PRIVATE HEALTH INSURANCE | Admitting: Podiatry

## 2014-06-17 ENCOUNTER — Encounter: Payer: Self-pay | Admitting: Podiatry

## 2014-06-17 VITALS — BP 150/87 | HR 83 | Resp 12

## 2014-06-17 DIAGNOSIS — R52 Pain, unspecified: Secondary | ICD-10-CM | POA: Diagnosis not present

## 2014-06-17 DIAGNOSIS — M7662 Achilles tendinitis, left leg: Secondary | ICD-10-CM

## 2014-06-17 MED ORDER — IBUPROFEN 600 MG PO TABS: 600.0000 mg | ORAL_TABLET | Freq: Three times a day (TID) | ORAL | Status: DC

## 2014-06-17 NOTE — Progress Notes (Signed)
   Subjective:    Patient ID: Marilyn [name], female    DOB: [birth year], 59 y.o.   MRN: [id number]  HPI  N-SHARP PAIN L-LT FOOT BACK OF THE HEEL D-9 MONTHS O-SLOWLY C-SAME, NOT WORSE A-STANDING, FLEXING T-NONE The symptoms in the left posterior heel began after patient stepped in a hole approximately August 2015. She had no immediate evaluation for the injury, however, the posterior heel pain has persisted since this described injury  RT FOOT HAVE CALLUS AND HAVE DISCOLORATION.  Review of Systems  Constitutional: Positive for fatigue.  Musculoskeletal: Positive for back pain and joint swelling.  Psychiatric/Behavioral: The patient is nervous/anxious.   All other systems reviewed and are negative.      Objective:   Physical Exam  Orientated 3  Vascular: DP and PT pulses 2/4 bilaterally Capillary reflex immediate bilaterally  Neurological: Ankle reflex equal and reactive bilaterally Vibratory sensation intact bilaterally Sensation to 10 g monofilament wire intact 5/5 bilaterally  Dermatological: Well-healed surgical scars dorsal aspect of the first MPJ bilaterally Nucleated plantar keratoses lateral base of fifth right metatarsal  Musculoskeletal: Palpable tenderness in the insertional area of tendo Achilles left without any palpable lesions Patient is able to heel off unilaterally and bilaterally without any difficulty   X-ray examination weightbearing left foot  Intact bony structure without fracture and/or dislocation Posterior and inferior calcaneal spurs Retained internal K wire fixation first metatarsal Bony resection head a proximal phalanx fifth toe Bone density appears adequate  Radiographic impression: No acute bony abnormality noted in the left foot    Assessment & Plan:   Assessment: Satisfactory neurovascular status Achilles tendinitis left  Plan: Reviewed the results of examination x-ray with patient today Advised patient to wear shoes  with a wedge heel Rx bent knee stretches Limit stating walking to tolerance Rx ibuprofen 600 mg by mouth 3 times a day 30 days  Reappoint at patient's request

## 2014-06-17 NOTE — Patient Instructions (Addendum)
Bent - Knee Calf Stretch  1) Stand an arm's length away from a wall. Place the palms of your hands on the wall. Step forward about 12 inches with the opposite foot.  2) Keeping toes pointed forward and both heels on the floor, bend both knees and lean forward. Hold this position for 60 seconds. Don't arch your back and don't hunch your shoulders.  3) Repeat this twice.  DO THIS STRETCHING TECHNIQUE 3 TIMES A DAY.   Stretching Exercises before Standing  Pull your toes up toward your nose and hold for 1 minute before standing.  A towel can assist with this exercise if you put the towel under the ball of your foot. This exercise reduces the intense   pain associated when changing from a seated to a standing position. This stretch can usually be the most beneficial if done before getting out of bed in the mornings. Achilles Tendinitis Achilles tendinitis is inflammation of the tough, cord-like band that attaches the lower muscles of your leg to your heel (Achilles tendon). It is usually caused by overusing the tendon and joint involved.  CAUSES Achilles tendinitis can happen because of:  A sudden increase in exercise or activity (such as running).  Doing the same exercises or activities (such as jumping) over and over.  Not warming up calf muscles before exercising.  Exercising in shoes that are worn out or not made for exercise.  Having arthritis or a bone growth on the back of the heel bone. This can rub against the tendon and hurt the tendon. SIGNS AND SYMPTOMS The most common symptoms are:  Pain in the back of the leg, just above the heel. The pain usually gets worse with exercise and better with rest.  Stiffness or soreness in the back of the leg, especially in the morning.  Swelling of the skin over the Achilles tendon.  Trouble standing on tiptoe. Sometimes, an Achilles tendon tears (ruptures). Symptoms of an Achilles tendon rupture can include:  Sudden, severe pain in the  back of the leg.  Trouble putting weight on the foot or walking normally. DIAGNOSIS Achilles tendinitis will be diagnosed based on symptoms and a physical examination. An X-ray may be done to check if another condition is causing your symptoms. An MRI may be ordered if your health care provider suspects you may have completely torn your tendon, which is called an Achilles tendon rupture.  TREATMENT  Achilles tendinitis usually gets better over time. It can take weeks to months to heal completely. Treatment focuses on treating the symptoms and helping the injury heal. HOME CARE INSTRUCTIONS   Rest your Achilles tendon and avoid activities that cause pain.  Apply ice to the injured area:  Put ice in a plastic bag.  Place a towel between your skin and the bag.  Leave the ice on for 20 minutes, 2-3 times a day  Try to avoid using the tendon (other than gentle range of motion) while the tendon is painful. Do not resume use until instructed by your health care provider. Then begin use gradually. Do not increase use to the point of pain. If pain does develop, decrease use and continue the above measures. Gradually increase activities that do not cause discomfort until you achieve normal use.  Do exercises to make your calf muscles stronger and more flexible. Your health care provider or physical therapist can recommend exercises for you to do.  Wrap your ankle with an elastic bandage or other wrap. This can  help keep your tendon from moving too much. Your health care provider will show you how to wrap your ankle correctly.  Only take over-the-counter or prescription medicines for pain, discomfort, or fever as directed by your health care provider. SEEK MEDICAL CARE IF:   Your pain and swelling increase or pain is uncontrolled with medicines.  You develop new, unexplained symptoms or your symptoms get worse.  You are unable to move your toes or foot.  You develop warmth and swelling in your  foot.  You have an unexplained temperature. MAKE SURE YOU:   Understand these instructions.  Will watch your condition.  Will get help right away if you are not doing well or get worse. Document Released: 11/23/2004 Document Revised: 12/04/2012 Document Reviewed: 09/25/2012 [facility] Patient Information 2015 [place], [place]. This information is not intended to replace advice given to you by your health care provider. Make sure you discuss any questions you have with your health care provider.

## 2014-07-06 ENCOUNTER — Other Ambulatory Visit: Payer: Self-pay | Admitting: *Deleted

## 2014-07-06 ENCOUNTER — Telehealth: Payer: Self-pay | Admitting: *Deleted

## 2014-07-06 DIAGNOSIS — C50412 Malignant neoplasm of upper-outer quadrant of left female breast: Secondary | ICD-10-CM

## 2014-07-06 MED ORDER — BUPROPION HCL ER (XL) 150 MG PO TB24: 150.0000 mg | ORAL_TABLET | Freq: Every day | ORAL | Status: DC

## 2014-07-06 NOTE — Telephone Encounter (Signed)
Wellbutrin refilled

## 2014-07-06 NOTE — Telephone Encounter (Signed)
TC from patient requesting refill for Welbutrin XL 150 mg. Last filled 12/22/13 per Lisabeth Register, NP  Is it ok to refill?  Pt. Has none left at this time.  Took last tablet yesterday, 07/05/14

## 2014-07-23 ENCOUNTER — Other Ambulatory Visit (INDEPENDENT_AMBULATORY_CARE_PROVIDER_SITE_OTHER): Payer: PRIVATE HEALTH INSURANCE

## 2014-07-23 ENCOUNTER — Ambulatory Visit (INDEPENDENT_AMBULATORY_CARE_PROVIDER_SITE_OTHER): Payer: PRIVATE HEALTH INSURANCE | Admitting: Internal Medicine

## 2014-07-23 ENCOUNTER — Encounter: Payer: Self-pay | Admitting: Internal Medicine

## 2014-07-23 VITALS — BP 124/78 | HR 82 | Ht 65.0 in | Wt 214.0 lb

## 2014-07-23 DIAGNOSIS — Z0000 Encounter for general adult medical examination without abnormal findings: Secondary | ICD-10-CM | POA: Insufficient documentation

## 2014-07-23 DIAGNOSIS — C50412 Malignant neoplasm of upper-outer quadrant of left female breast: Secondary | ICD-10-CM | POA: Diagnosis not present

## 2014-07-23 DIAGNOSIS — E559 Vitamin D deficiency, unspecified: Secondary | ICD-10-CM | POA: Diagnosis not present

## 2014-07-23 LAB — CBC WITH DIFFERENTIAL/PLATELET
BASOS ABS: 0 10*3/uL (ref 0.0–0.1)
BASOS PCT: 0.4 % (ref 0.0–3.0)
Eosinophils Absolute: 0.2 10*3/uL (ref 0.0–0.7)
Eosinophils Relative: 2.3 % (ref 0.0–5.0)
HCT: 40.1 % (ref 36.0–46.0)
HEMOGLOBIN: 12.9 g/dL (ref 12.0–15.0)
LYMPHS PCT: 19.3 % (ref 12.0–46.0)
Lymphs Abs: 1.9 10*3/uL (ref 0.7–4.0)
MCHC: 32.2 g/dL (ref 30.0–36.0)
MCV: 75 fl — AB (ref 78.0–100.0)
MONO ABS: 0.7 10*3/uL (ref 0.1–1.0)
MONOS PCT: 7.4 % (ref 3.0–12.0)
NEUTROS ABS: 6.8 10*3/uL (ref 1.4–7.7)
Neutrophils Relative %: 70.6 % (ref 43.0–77.0)
PLATELETS: 239 10*3/uL (ref 150.0–400.0)
RBC: 5.34 Mil/uL — AB (ref 3.87–5.11)
RDW: 15.2 % (ref 11.5–15.5)
WBC: 9.6 10*3/uL (ref 4.0–10.5)

## 2014-07-23 LAB — HEPATIC FUNCTION PANEL
ALK PHOS: 104 U/L (ref 39–117)
ALT: 16 U/L (ref 0–35)
AST: 13 U/L (ref 0–37)
Albumin: 4.3 g/dL (ref 3.5–5.2)
BILIRUBIN DIRECT: 0.1 mg/dL (ref 0.0–0.3)
BILIRUBIN TOTAL: 0.3 mg/dL (ref 0.2–1.2)
Total Protein: 7.5 g/dL (ref 6.0–8.3)

## 2014-07-23 LAB — LIPID PANEL
CHOLESTEROL: 203 mg/dL — AB (ref 0–200)
HDL: 62.7 mg/dL (ref >39.00)
LDL Cholesterol: 116 mg/dL — ABNORMAL HIGH (ref 0–99)
NonHDL: 140.3
Total CHOL/HDL Ratio: 3
Triglycerides: 123 mg/dL (ref 0.0–149.0)
VLDL: 24.6 mg/dL (ref 0.0–40.0)

## 2014-07-23 LAB — BASIC METABOLIC PANEL
BUN: 16 mg/dL (ref 6–23)
CALCIUM: 9.8 mg/dL (ref 8.4–10.5)
CO2: 26 mEq/L (ref 19–32)
CREATININE: 0.84 mg/dL (ref 0.40–1.20)
Chloride: 106 mEq/L (ref 96–112)
GFR: 88.83 mL/min (ref >60.00)
GLUCOSE: 95 mg/dL (ref 70–99)
POTASSIUM: 4.6 meq/L (ref 3.5–5.1)
SODIUM: 137 meq/L (ref 135–145)

## 2014-07-23 LAB — URINALYSIS, ROUTINE W REFLEX MICROSCOPIC
BILIRUBIN URINE: NEGATIVE
KETONES UR: NEGATIVE
Leukocytes, UA: NEGATIVE
NITRITE: NEGATIVE
PH: 5.5 (ref 5.0–8.0)
Specific Gravity, Urine: >=1.03 — AB (ref 1.000–1.030)
TOTAL PROTEIN, URINE-UPE24: NEGATIVE
Urine Glucose: NEGATIVE
Urobilinogen, UA: 0.2 (ref 0.0–1.0)

## 2014-07-23 LAB — TSH: TSH: 1.63 u[IU]/mL (ref 0.35–4.50)

## 2014-07-23 LAB — VITAMIN D 25 HYDROXY (VIT D DEFICIENCY, FRACTURES): VITD: 36.92 ng/mL (ref 30.00–100.00)

## 2014-07-23 NOTE — Assessment & Plan Note (Signed)
On Rx Labs 

## 2014-07-23 NOTE — Progress Notes (Signed)
Pre visit review using our clinic review tool, if applicable. No additional management support is needed unless otherwise documented below in the visit note. 

## 2014-07-23 NOTE — Patient Instructions (Signed)
Zostavax

## 2014-07-23 NOTE — Assessment & Plan Note (Signed)
Dr Lindi Adie On Arimidex

## 2014-07-23 NOTE — Progress Notes (Signed)
   Subjective:    HPI   The patient is here for a wellness exam. On [employer]. Sister w/pancr CA at 60 yo   Side effects w/Arimidex since 09/2011- arthralgias, swelling - able to tolerate  Wt Readings from Last 3 Encounters:  07/23/14 214 lb (97.07 kg)  06/02/14 220 lb (99.791 kg)  05/19/14 219 lb 6.4 oz (99.519 kg)   BP Readings from Last 3 Encounters:  07/23/14 124/78  06/17/14 150/87  06/02/14 118/74       Review of Systems  Constitutional: Positive for unexpected weight change. Negative for chills, activity change, appetite change and fatigue.  HENT: Negative for congestion, mouth sores and sinus pressure.   Eyes: Negative for visual disturbance.  Respiratory: Negative for cough, chest tightness and wheezing.   Gastrointestinal: Negative for nausea and abdominal pain.  Genitourinary: Negative for frequency, difficulty urinating and vaginal pain.  Musculoskeletal: Negative for myalgias, back pain and gait problem.  Skin: Negative for pallor and rash.  Neurological: Negative for dizziness, tremors, weakness, numbness and headaches.  Psychiatric/Behavioral: Negative for suicidal ideas, confusion and sleep disturbance. The patient is nervous/anxious.        Objective:   Physical Exam  Constitutional: She appears well-developed. No distress.  HENT:  Head: Normocephalic.  Right Ear: External ear normal.  Left Ear: External ear normal.  Nose: Nose normal.  Mouth/Throat: Oropharynx is clear and moist.  Eyes: Conjunctivae are normal. Pupils are equal, round, and reactive to light. Right eye exhibits no discharge. Left eye exhibits no discharge.  Neck: Normal range of motion. Neck supple. No JVD present. No tracheal deviation present. No thyromegaly present.  Cardiovascular: Normal rate, regular rhythm and normal heart sounds.   Pulmonary/Chest: No stridor. No respiratory distress. She has no wheezes.  Abdominal: Soft. Bowel sounds are normal. She exhibits no  distension and no mass. There is no tenderness. There is no rebound and no guarding.  Musculoskeletal: She exhibits no edema or tenderness.  Lymphadenopathy:    She has no cervical adenopathy.  Neurological: She displays normal reflexes. No cranial nerve deficit. She exhibits normal muscle tone. Coordination normal.  Skin: No rash noted. No erythema.  Psychiatric: She has a normal mood and affect. Her behavior is normal. Judgment and thought content normal.  trace edema B   Lab Results  Component Value Date   WBC 7.7 11/04/2013   HGB 12.9 03/15/2014   HCT 38.0 03/15/2014   PLT 247 11/04/2013   GLUCOSE 110* 03/15/2014   CHOL 189 11/04/2013   TRIG 145 11/04/2013   HDL 55 11/04/2013   LDLDIRECT 118.9 12/18/2011   LDLCALC 105* 11/04/2013   ALT 13 11/04/2013   AST 12 11/04/2013   NA 140 03/15/2014   K 4.0 03/15/2014   CL 106 03/15/2014   CREATININE 0.70 03/15/2014   BUN 12 03/15/2014   CO2 23 11/04/2013   TSH 1.02 07/07/2013         Assessment & Plan:

## 2014-07-23 NOTE — Assessment & Plan Note (Signed)
We discussed age appropriate health related issues, including available/recomended screening tests and vaccinations. We discussed a need for adhering to healthy diet and exercise. Labs/EKG were reviewed/ordered. All questions were answered. Zostavax discussed 

## 2014-09-18 ENCOUNTER — Encounter: Payer: Self-pay | Admitting: Gastroenterology

## 2014-09-29 ENCOUNTER — Other Ambulatory Visit: Payer: Self-pay | Admitting: Oncology

## 2014-09-29 ENCOUNTER — Encounter: Payer: Self-pay | Admitting: Gastroenterology

## 2014-09-30 ENCOUNTER — Other Ambulatory Visit: Payer: Self-pay

## 2014-09-30 MED ORDER — ANASTROZOLE 1 MG PO TABS: 1.0000 mg | ORAL_TABLET | Freq: Every day | ORAL | Status: DC

## 2014-11-23 NOTE — Assessment & Plan Note (Signed)
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 Plan to treat her until December 2018.  Arimidex toxicities 1. Mild muscle aches and pains 2. Bone density done 2015 showed a T score -1.3 osteopenia: Continue with calcium and vitamin D and encouraged her to eat more yogurt. She takes vitamin D supplementation.  Breast cancer surveillance: 1. Breast exam 11/24/2014 is without any abnormalities 2. Mammograms 06/15/14 Normal; Breast density C  RTC in 6 months

## 2014-11-24 ENCOUNTER — Telehealth: Payer: Self-pay | Admitting: Hematology and Oncology

## 2014-11-24 ENCOUNTER — Other Ambulatory Visit (HOSPITAL_BASED_OUTPATIENT_CLINIC_OR_DEPARTMENT_OTHER): Payer: PRIVATE HEALTH INSURANCE | Admitting: *Deleted

## 2014-11-24 ENCOUNTER — Encounter: Payer: Self-pay | Admitting: Hematology and Oncology

## 2014-11-24 ENCOUNTER — Ambulatory Visit (HOSPITAL_BASED_OUTPATIENT_CLINIC_OR_DEPARTMENT_OTHER): Payer: PRIVATE HEALTH INSURANCE | Admitting: Hematology and Oncology

## 2014-11-24 VITALS — BP 138/84 | HR 63 | Temp 98.0°F | Resp 18 | Ht 65.0 in | Wt 215.5 lb

## 2014-11-24 DIAGNOSIS — C50412 Malignant neoplasm of upper-outer quadrant of left female breast: Secondary | ICD-10-CM | POA: Diagnosis not present

## 2014-11-24 DIAGNOSIS — Z23 Encounter for immunization: Secondary | ICD-10-CM | POA: Diagnosis not present

## 2014-11-24 MED ORDER — INFLUENZA VAC SPLIT QUAD 0.5 ML IM SUSY
0.5000 mL | PREFILLED_SYRINGE | Freq: Once | INTRAMUSCULAR | Status: AC
  Administered 2014-11-24: 0.5 mL via INTRAMUSCULAR
  Filled 2014-11-24: qty 0.5

## 2014-11-24 NOTE — Progress Notes (Signed)
Patient Care Team: Cassandria Anger, MD as PCP - General (Internal Medicine) Jesusita Oka, RN as Registered Nurse Inda Castle, MD as Consulting Physician (Gastroenterology) Maisie Fus, MD as Consulting Physician (Obstetrics and Gynecology)  DIAGNOSIS: Breast cancer of upper-outer quadrant of left female breast   Staging form: Breast, AJCC 7th Edition     Clinical: Stage IA (T1c, N0, cM0) - Unsigned       Staging comments: Staged In Breast Conference  4.17.13      Pathologic: No stage assigned - Unsigned   SUMMARY OF ONCOLOGIC HISTORY:   Breast cancer of upper-outer quadrant of left female breast   06/20/2011 Surgery Left breast lumpectomy with sentinel lymph node biopsy: 1 cm IDC, grade 1, ER positive, PR positive, HER-2 negative, Ki-67 6%   08/17/2011 - 09/28/2011 Radiation Therapy Adjuvant radiation therapy   10/02/2011 -  Anti-estrogen oral therapy Arimidex 1 mg daily started 10/02/2011 stopped March 2015 for pain and restarted August 2015    CHIEF COMPLIANT:  Follow-up on anastrozole  INTERVAL HISTORY: Marilyn [name] is a  59 year old with above-mentioned history left breast cancer treated with lumpectomy and radiation and is now on anastrozole. She is tolerating it extremely well. She denies any hot flashes or myalgias. She complains of mild discomfort in the left side of the breast.  patient is extremely sad because recently her sister had passed away from pancreatic cancer  REVIEW OF SYSTEMS:   Constitutional: Denies fevers, chills or abnormal weight loss Eyes: Denies blurriness of vision Ears, nose, mouth, throat, and face: Denies mucositis or sore throat Respiratory: Denies cough, dyspnea or wheezes Cardiovascular: Denies palpitation, chest discomfort or lower extremity swelling Gastrointestinal:  Denies nausea, heartburn or change in bowel habits Skin: Denies abnormal skin rashes Lymphatics: Denies new lymphadenopathy or easy bruising Neurological:Denies numbness,  tingling or new weaknesses Behavioral/Psych: Mood is stable, no new changes  Breast:  denies any pain or lumps or nodules in either breasts All other systems were reviewed with the patient and are negative.  I have reviewed the past medical history, past surgical history, social history and family history with the patient and they are unchanged from previous note.  ALLERGIES:  is allergic to penicillins.  MEDICATIONS:  Current Outpatient Prescriptions  Medication Sig Dispense Refill  . ALPRAZolam (XANAX) 0.5 MG tablet Take 1/2 to 1 tablet by mouth three times daily as needed for anxiety (Patient taking differently: Take 0.5-1 mg by mouth 3 (three) times daily as needed for anxiety. Take 1/2 to 1 tablet by mouth three times daily as needed for anxiety) 90 tablet 5  . anastrozole (ARIMIDEX) 1 MG tablet Take 1 tablet (1 mg total) by mouth daily. 90 tablet 1  . buPROPion (WELLBUTRIN XL) 150 MG 24 hr tablet Take 1 tablet (150 mg total) by mouth daily. 90 tablet 1  . busPIRone (BUSPAR) 10 MG tablet Take 10 mg by mouth 3 (three) times daily.  2  . Cholecalciferol 5000 UNITS TABS Take 1 tablet by mouth 3 (three) times a week.    . Clobetasol Propionate 0.05 % lotion Apply to affected areas 2-times per day for 2 weeks (Patient taking differently: Apply 1 application topically as needed (for itching / rash). Apply to affected areas 2-times per day for 2 weeks) 118 mL 1  . ibuprofen (ADVIL,MOTRIN) 600 MG tablet Take 1 tablet (600 mg total) by mouth 3 (three) times daily. 90 tablet 0  . Multiple Vitamin (MULTIVITAMIN WITH MINERALS) TABS tablet Take 1  tablet by mouth daily.    . [DISCONTINUED] Calcium Carbonate-Vit D-Min (CALCIUM 1200) 1200-1000 MG-UNIT CHEW Chew 1,200 mg by mouth daily. (Patient not taking: Reported on 05/19/2014) 30 each 11  . [DISCONTINUED] furosemide (LASIX) 20 MG tablet Take 1-2 tablets (20-40 mg total) by mouth daily as needed for edema. (Patient not taking: Reported on 05/19/2014) 60  tablet 3   No current facility-administered medications for this visit.    PHYSICAL EXAMINATION: ECOG PERFORMANCE STATUS: 1 - Symptomatic but completely ambulatory  Filed Vitals:   11/24/14 1036  BP: 138/84  Pulse: 63  Temp: 98 F (36.7 C)  Resp: 18   Filed Weights   11/24/14 1036  Weight: 215 lb 8 oz (97.75 kg)    GENERAL:alert, no distress and comfortable SKIN: skin color, texture, turgor are normal, no rashes or significant lesions EYES: normal, Conjunctiva are pink and non-injected, sclera clear OROPHARYNX:no exudate, no erythema and lips, buccal mucosa, and tongue normal  NECK: supple, thyroid normal size, non-tender, without nodularity LYMPH:  no palpable lymphadenopathy in the cervical, axillary or inguinal LUNGS: clear to auscultation and percussion with normal breathing effort HEART: regular rate & rhythm and no murmurs and no lower extremity edema ABDOMEN:abdomen soft, non-tender and normal bowel sounds Musculoskeletal:no cyanosis of digits and no clubbing  NEURO: alert & oriented x 3 with fluent speech, no focal motor/sensory deficits BREAST: No palpable masses or nodules in either right or left breasts. No palpable axillary supraclavicular or infraclavicular adenopathy no breast tenderness or nipple discharge. (exam performed in the presence of a chaperone)  LABORATORY DATA:  I have reviewed the data as listed   Chemistry      Component Value Date/Time   NA 137 07/23/2014 0900   NA 138 11/04/2013 0929   K 4.6 07/23/2014 0900   K 3.8 11/04/2013 0929   CL 106 07/23/2014 0900   CL 107 03/22/2012 0853   CO2 26 07/23/2014 0900   CO2 23 11/04/2013 0929   BUN 16 07/23/2014 0900   BUN 12.1 11/04/2013 0929   CREATININE 0.84 07/23/2014 0900   CREATININE 0.8 11/04/2013 0929      Component Value Date/Time   CALCIUM 9.8 07/23/2014 0900   CALCIUM 9.6 11/04/2013 0929   ALKPHOS 104 07/23/2014 0900   ALKPHOS 108 11/04/2013 0929   AST 13 07/23/2014 0900   AST 12  11/04/2013 0929   ALT 16 07/23/2014 0900   ALT 13 11/04/2013 0929   BILITOT 0.3 07/23/2014 0900   BILITOT 0.28 11/04/2013 0929       Lab Results  Component Value Date   WBC 9.6 07/23/2014   HGB 12.9 07/23/2014   HCT 40.1 07/23/2014   MCV 75.0* 07/23/2014   PLT 239.0 07/23/2014   NEUTROABS 6.8 07/23/2014    ASSESSMENT & PLAN:  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 Plan to treat her until December 2018.  Arimidex toxicities 1. Mild muscle aches and pains 2. Bone density done 2015 showed a T score -1.3 osteopenia: Continue with calcium and vitamin D and encouraged her to eat more yogurt. She takes vitamin D supplementation.  Breast cancer surveillance: 1. Breast exam  11/24/2014 is without any abnormalities 2. Mammograms 06/15/14 Normal; Breast density C  RTC in  1 year for follow-up.   No orders of the defined types were placed in this encounter.   The patient has a good understanding of the overall plan. she agrees with it. she will call with any problems that may develop before the next visit here.   Rulon Eisenmenger, MD

## 2014-11-24 NOTE — Telephone Encounter (Signed)
Spoke with patient and she is aware of her 2017 appointment

## 2014-11-26 ENCOUNTER — Ambulatory Visit (AMBULATORY_SURGERY_CENTER): Payer: Self-pay | Admitting: *Deleted

## 2014-11-26 VITALS — Ht 65.0 in | Wt 215.4 lb

## 2014-11-26 DIAGNOSIS — Z800 Family history of malignant neoplasm of digestive organs: Secondary | ICD-10-CM

## 2014-11-26 MED ORDER — NA SULFATE-K SULFATE-MG SULF 17.5-3.13-1.6 GM/177ML PO SOLN: ORAL | Status: DC

## 2014-11-26 NOTE — Progress Notes (Signed)
No egg or soy allergy  No anesthesia or intubation problems per pt  No diet medications taken  Registered in EMMI   

## 2014-12-02 ENCOUNTER — Telehealth: Payer: Self-pay | Admitting: Gastroenterology

## 2014-12-03 ENCOUNTER — Other Ambulatory Visit: Payer: Self-pay

## 2014-12-03 NOTE — Telephone Encounter (Signed)
New instructions mailed to the patient. She is aware.

## 2014-12-10 ENCOUNTER — Encounter: Payer: PRIVATE HEALTH INSURANCE | Admitting: Gastroenterology

## 2014-12-28 ENCOUNTER — Other Ambulatory Visit: Payer: Self-pay | Admitting: Hematology and Oncology

## 2014-12-29 ENCOUNTER — Encounter: Payer: PRIVATE HEALTH INSURANCE | Admitting: Gastroenterology

## 2015-01-25 ENCOUNTER — Ambulatory Visit (INDEPENDENT_AMBULATORY_CARE_PROVIDER_SITE_OTHER): Payer: PRIVATE HEALTH INSURANCE | Admitting: Internal Medicine

## 2015-01-25 ENCOUNTER — Encounter: Payer: Self-pay | Admitting: Internal Medicine

## 2015-01-25 VITALS — BP 120/60 | HR 78 | Wt 216.0 lb

## 2015-01-25 DIAGNOSIS — F410 Panic disorder [episodic paroxysmal anxiety] without agoraphobia: Secondary | ICD-10-CM | POA: Diagnosis not present

## 2015-01-25 DIAGNOSIS — F419 Anxiety disorder, unspecified: Secondary | ICD-10-CM

## 2015-01-25 DIAGNOSIS — C50412 Malignant neoplasm of upper-outer quadrant of left female breast: Secondary | ICD-10-CM | POA: Diagnosis not present

## 2015-01-25 DIAGNOSIS — Z0000 Encounter for general adult medical examination without abnormal findings: Secondary | ICD-10-CM

## 2015-01-25 DIAGNOSIS — E559 Vitamin D deficiency, unspecified: Secondary | ICD-10-CM

## 2015-01-25 DIAGNOSIS — F4323 Adjustment disorder with mixed anxiety and depressed mood: Secondary | ICD-10-CM | POA: Diagnosis not present

## 2015-01-25 NOTE — Progress Notes (Signed)
Pre visit review using our clinic review tool, if applicable. No additional management support is needed unless otherwise documented below in the visit note. 

## 2015-01-25 NOTE — Assessment & Plan Note (Signed)
On Buspar, bupropion 11/16 sister died - grieving

## 2015-01-25 NOTE — Assessment & Plan Note (Signed)
On Arimidex 

## 2015-01-25 NOTE — Assessment & Plan Note (Signed)
On Buspar, bupropion 11/16 sister died - grieving Discussed

## 2015-01-25 NOTE — Assessment & Plan Note (Signed)
On Vit D 

## 2015-01-25 NOTE — Progress Notes (Signed)
Subjective:  Patient ID: Marilyn [name], female    DOB: [birth year]  Age: 59 y.o. MRN: [id number]  CC: No chief complaint on file.   HPI Marilyn [name] presents for depression, anxiety, breast cancer f/u.   Outpatient Prescriptions Prior to Visit  Medication Sig Dispense Refill  . ALPRAZolam (XANAX) 0.5 MG tablet Take 1/2 to 1 tablet by mouth three times daily as needed for anxiety (Patient taking differently: Take 0.5-1 mg by mouth 3 (three) times daily as needed for anxiety. Take 1/2 to 1 tablet by mouth three times daily as needed for anxiety) 90 tablet 5  . anastrozole (ARIMIDEX) 1 MG tablet Take 1 tablet (1 mg total) by mouth daily. 90 tablet 1  . buPROPion (WELLBUTRIN XL) 150 MG 24 hr tablet TAKE 1 TABLET (150 MG TOTAL) BY MOUTH DAILY. 90 tablet 2  . busPIRone (BUSPAR) 10 MG tablet Take 10 mg by mouth 3 (three) times daily.  2  . Cholecalciferol 5000 UNITS TABS Take 1 tablet by mouth 3 (three) times a week.    . Clobetasol Propionate 0.05 % lotion Apply to affected areas 2-times per day for 2 weeks (Patient taking differently: Apply 1 application topically as needed (for itching / rash). Apply to affected areas 2-times per day for 2 weeks) 118 mL 1  . Multiple Vitamin (MULTIVITAMIN WITH MINERALS) TABS tablet Take 1 tablet by mouth daily.    . Na Sulfate-K Sulfate-Mg Sulf (SUPREP BOWEL PREP) SOLN Suprep as directed, no substitutions 354 mL 0  . ibuprofen (ADVIL,MOTRIN) 600 MG tablet Take 1 tablet (600 mg total) by mouth 3 (three) times daily. (Patient not taking: Reported on 01/25/2015) 90 tablet 0   No facility-administered medications prior to visit.    ROS Review of Systems  Constitutional: Negative for chills, activity change, appetite change, fatigue and unexpected weight change.  HENT: Negative for congestion, mouth sores and sinus pressure.   Eyes: Negative for visual disturbance.  Respiratory: Negative for cough and chest tightness.   Gastrointestinal: Negative for  nausea and abdominal pain.  Genitourinary: Negative for frequency, difficulty urinating and vaginal pain.  Musculoskeletal: Negative for back pain and gait problem.  Skin: Negative for pallor and rash.  Neurological: Negative for dizziness, tremors, weakness, numbness and headaches.  Psychiatric/Behavioral: Negative for confusion and sleep disturbance. The patient is nervous/anxious.     Objective:  BP 120/60 mmHg  Pulse 78  Wt 216 lb (97.977 kg)  SpO2 98%  BP Readings from Last 3 Encounters:  01/25/15 120/60  11/24/14 138/84  07/23/14 124/78    Wt Readings from Last 3 Encounters:  01/25/15 216 lb (97.977 kg)  11/26/14 215 lb 6.4 oz (97.705 kg)  11/24/14 215 lb 8 oz (97.75 kg)    Physical Exam  Constitutional: She appears well-developed. No distress.  HENT:  Head: Normocephalic.  Right Ear: External ear normal.  Left Ear: External ear normal.  Nose: Nose normal.  Mouth/Throat: Oropharynx is clear and moist.  Eyes: Conjunctivae are normal. Pupils are equal, round, and reactive to light. Right eye exhibits no discharge. Left eye exhibits no discharge.  Neck: Normal range of motion. Neck supple. No JVD present. No tracheal deviation present. No thyromegaly present.  Cardiovascular: Normal rate, regular rhythm and normal heart sounds.   Pulmonary/Chest: No stridor. No respiratory distress. She has no wheezes.  Abdominal: Soft. Bowel sounds are normal. She exhibits no distension and no mass. There is no tenderness. There is no rebound and no guarding.  Musculoskeletal: She  exhibits no edema or tenderness.  Lymphadenopathy:    She has no cervical adenopathy.  Neurological: She displays normal reflexes. No cranial nerve deficit. She exhibits normal muscle tone. Coordination normal.  Skin: No rash noted. No erythema.  Psychiatric: She has a normal mood and affect. Her behavior is normal. Judgment and thought content normal.    Lab Results  Component Value Date   WBC 9.6  07/23/2014   HGB 12.9 07/23/2014   HCT 40.1 07/23/2014   PLT 239.0 07/23/2014   GLUCOSE 95 07/23/2014   CHOL 203* 07/23/2014   TRIG 123.0 07/23/2014   HDL 62.70 07/23/2014   LDLDIRECT 118.9 12/18/2011   LDLCALC 116* 07/23/2014   ALT 16 07/23/2014   AST 13 07/23/2014   NA 137 07/23/2014   K 4.6 07/23/2014   CL 106 07/23/2014   CREATININE 0.84 07/23/2014   BUN 16 07/23/2014   CO2 26 07/23/2014   TSH 1.63 07/23/2014    Mm Diag Breast Tomo Bilateral  06/15/2014  CLINICAL DATA:  History of left breast cancer, diagnosed in 2013. Annual exam. EXAM: DIGITAL DIAGNOSTIC BILATERAL MAMMOGRAM WITH 3D TOMOSYNTHESIS AND CAD COMPARISON:  With priors ACR Breast Density Category c: The breast tissue is heterogeneously dense, which may obscure small masses. FINDINGS: No mass, nonsurgical distortion, or suspicious microcalcification is identified in either breast to suggest malignancy. Stable lumpectomy changes in the outer left breast. Mammographic images were processed with CAD. IMPRESSION: No evidence of malignancy in either breast. RECOMMENDATION: Diagnostic mammogram is suggested in 1 year. (Code:DM-B-01Y) I have discussed the findings and recommendations with the patient. Results were also provided in writing at the conclusion of the visit. If applicable, a reminder letter will be sent to the patient regarding the next appointment. BI-RADS CATEGORY  2: Benign. Electronically Signed   By: Curlene Dolphin M.D.   On: 06/15/2014 14:20    Assessment & Plan:   Diagnoses and all orders for this visit:  Anxiety  Adjustment disorder with mixed anxiety and depressed mood  Breast cancer of upper-outer quadrant of left female breast ([place])  PANIC DISORDER  I am having [name] maintain her ALPRAZolam, Clobetasol Propionate, multivitamin with minerals, busPIRone, ibuprofen, Cholecalciferol, anastrozole, Na Sulfate-K Sulfate-Mg Sulf, and buPROPion.  No orders of the defined types were placed in this  encounter.     Follow-up: Return in about 6 months (around 07/25/2015) for Wellness Exam.  Walker Kehr, MD

## 2015-02-01 ENCOUNTER — Ambulatory Visit (AMBULATORY_SURGERY_CENTER): Payer: PRIVATE HEALTH INSURANCE | Admitting: Gastroenterology

## 2015-02-01 ENCOUNTER — Encounter: Payer: Self-pay | Admitting: Gastroenterology

## 2015-02-01 VITALS — BP 158/80 | HR 61 | Temp 98.2°F | Resp 21 | Ht 65.0 in | Wt 215.0 lb

## 2015-02-01 DIAGNOSIS — Z800 Family history of malignant neoplasm of digestive organs: Secondary | ICD-10-CM | POA: Diagnosis not present

## 2015-02-01 DIAGNOSIS — Z1211 Encounter for screening for malignant neoplasm of colon: Secondary | ICD-10-CM

## 2015-02-01 DIAGNOSIS — D123 Benign neoplasm of transverse colon: Secondary | ICD-10-CM | POA: Diagnosis not present

## 2015-02-01 DIAGNOSIS — K635 Polyp of colon: Secondary | ICD-10-CM | POA: Diagnosis not present

## 2015-02-01 DIAGNOSIS — D125 Benign neoplasm of sigmoid colon: Secondary | ICD-10-CM

## 2015-02-01 LAB — HM COLONOSCOPY

## 2015-02-01 MED ORDER — SODIUM CHLORIDE 0.9 % IV SOLN: 500.0000 mL | INTRAVENOUS | Status: DC

## 2015-02-01 NOTE — Op Note (Signed)
[facility]  [address] [place], [id number]   COLONOSCOPY PROCEDURE REPORT  PATIENT: Marilyn [name], Marilyn [name]  MR#: [id number] BIRTHDATE: [birth year] , 59  yrs. old GENDER: female ENDOSCOPIST: Harl Bowie, MD REFERRED [id number] Avel Sensor, M.D. PROCEDURE DATE:  02/01/2015 PROCEDURE:   Colonoscopy, surveillance First Screening Colonoscopy - Avg.  risk and is 50 yrs.  old or older - No.  Prior Negative Screening - Now for repeat screening. Less than 10 yrs Prior Negative Screening - Now for repeat screening.  Above average risk  History of Adenoma - Now for follow-up colonoscopy & has been > or = to 3 yrs.  No.  It has been less than 3 yrs since last colonoscopy.  Other: See Comments Polyps removed today? Yes ASA CLASS:   Class II INDICATIONS:FH Colon or Rectal Adenocarcinoma and Screening for colonic neoplasia. MEDICATIONS: Propofol 300 mg IV  DESCRIPTION OF PROCEDURE:   After the risks benefits and alternatives of the procedure were thoroughly explained, informed consent was obtained.  The digital rectal exam revealed no abnormalities of the rectum.   The LB [id number]-H190 [id number]  endoscope was introduced through the anus and advanced to the cecum, which was identified by both the appendix and ileocecal valve. No adverse events experienced.   The quality of the prep was good.  The instrument was then slowly withdrawn as the colon was fully examined. Estimated blood loss is zero unless otherwise noted in this procedure report.   COLON FINDINGS: Two sessile polyps ranging between 3-43mm in size was found in the transverse colon.  A polypectomy was performed with a cold snare nd cold forceps.  The resection was complete, the polyp tissue was completely retrieved and sent to histology. 2 small hyperplastic-appearing polyps in sigmoid colon removed with cold forceps. Retroflexed views revealed no abnormalities. The time to cecum = 5.2 Withdrawal time = 13.7   The scope  was withdrawn and the procedure completed. COMPLICATIONS: There were no immediate complications.  ENDOSCOPIC IMPRESSION: Sessile polyp ranging between 5-17mm in size was found in the transverse colon; polypectomy was performed with a cold snare  RECOMMENDATIONS: 1.  Await pathology results 2.  If the polyp(s) removed today are proven to be adenomatous (pre-cancerous) polyps, you will need a repeat colonoscopy in 5 years.  Otherwise you should continue to follow colorectal cancer screening guidelines for "routine risk" patients with colonoscopy in 10 years.  You will receive a letter within 1-2 weeks with the results of your biopsy as well as final recommendations.  Please call my office if you have not received a letter after 3 weeks.  eSigned:  Harl Bowie, MD 02/01/2015 8:47 AM

## 2015-02-01 NOTE — Progress Notes (Signed)
Report to PACU, RN, vss, BBS= Clear.  

## 2015-02-01 NOTE — Patient Instructions (Signed)
YOU HAD AN ENDOSCOPIC PROCEDURE TODAY AT [facility]:   Refer to the procedure report that was given to you for any specific questions about what was found during the examination.  If the procedure report does not answer your questions, please call your gastroenterologist to clarify.  If you requested that your care partner not be given the details of your procedure findings, then the procedure report has been included in a sealed envelope for you to review at your convenience later.  YOU SHOULD EXPECT: Some feelings of bloating in the abdomen. Passage of more gas than usual.  Walking can help get rid of the air that was put into your GI tract during the procedure and reduce the bloating. If you had a lower endoscopy (such as a colonoscopy or flexible sigmoidoscopy) you may notice spotting of blood in your stool or on the toilet paper. If you underwent a bowel prep for your procedure, you may not have a normal bowel movement for a few days.  Please Note:  You might notice some irritation and congestion in your nose or some drainage.  This is from the oxygen used during your procedure.  There is no need for concern and it should clear up in a day or so.  SYMPTOMS TO REPORT IMMEDIATELY:   Following lower endoscopy (colonoscopy or flexible sigmoidoscopy):  Excessive amounts of blood in the stool  Significant tenderness or worsening of abdominal pains  Swelling of the abdomen that is new, acute  Fever of 100F or higher   For urgent or emergent issues, a gastroenterologist can be reached at any hour by calling [phone].   DIET: Your first meal following the procedure should be a small meal and then it is ok to progress to your normal diet. Heavy or fried foods are harder to digest and may make you feel nauseous or bloated.  Likewise, meals heavy in dairy and vegetables can increase bloating.  Drink plenty of fluids but you should avoid alcoholic beverages for 24  hours.  ACTIVITY:  You should plan to take it easy for the rest of today and you should NOT DRIVE or use heavy machinery until tomorrow (because of the sedation medicines used during the test).    FOLLOW UP: Our staff will call the number listed on your records the next business day following your procedure to check on you and address any questions or concerns that you may have regarding the information given to you following your procedure. If we do not reach you, we will leave a message.  However, if you are feeling well and you are not experiencing any problems, there is no need to return our call.  We will assume that you have returned to your regular daily activities without incident.  If any biopsies were taken you will be contacted by phone or by letter within the next 1-3 weeks.  Please call us at [phone] if you have not heard about the biopsies in 3 weeks.    SIGNATURES/CONFIDENTIALITY: You and/or your care partner have signed paperwork which will be entered into your electronic medical record.  These signatures attest to the fact that that the information above on your After Visit Summary has been reviewed and is understood.  Full responsibility of the confidentiality of this discharge information lies with you and/or your care-partner.  Polyp handout given

## 2015-02-01 NOTE — Progress Notes (Signed)
Called to room to assist during endoscopic procedure.  Patient ID and intended procedure confirmed with present staff. Received instructions for my participation in the procedure from the performing physician.  

## 2015-02-02 ENCOUNTER — Telehealth: Payer: Self-pay | Admitting: *Deleted

## 2015-02-02 NOTE — Telephone Encounter (Signed)
  Follow up Call-  Call back number 02/01/2015  Post procedure Call Back phone  # [phone]  Permission to leave phone message Yes     Patient questions:  Do you have a fever, pain , or abdominal swelling? No. Pain Score  0 *  Have you tolerated food without any problems? Yes.    Have you been able to return to your normal activities? Yes.    Do you have any questions about your discharge instructions: Diet   No. Medications  No. Follow up visit  No.  Do you have questions or concerns about your Care? No.  Actions: * If pain score is 4 or above: No action needed, pain <4.

## 2015-02-15 ENCOUNTER — Encounter: Payer: Self-pay | Admitting: Gastroenterology

## 2015-02-23 NOTE — Addendum Note (Signed)
Addended by: Steva Ready on: 02/23/2015 02:57 PM   Modules accepted: Level of Service

## 2015-04-01 ENCOUNTER — Other Ambulatory Visit: Payer: Self-pay | Admitting: Hematology and Oncology

## 2015-04-28 ENCOUNTER — Ambulatory Visit (INDEPENDENT_AMBULATORY_CARE_PROVIDER_SITE_OTHER): Payer: No Typology Code available for payment source | Admitting: Podiatry

## 2015-04-28 ENCOUNTER — Encounter: Payer: Self-pay | Admitting: Podiatry

## 2015-04-28 VITALS — BP 131/86 | HR 77 | Resp 12

## 2015-04-28 DIAGNOSIS — M7662 Achilles tendinitis, left leg: Secondary | ICD-10-CM

## 2015-04-28 NOTE — Progress Notes (Signed)
   Subjective:    Patient ID: Marilyn [name], female    DOB: Sep 04, 1955, 60 y.o.   MRN: [id number]  HPI This patient presents today for follow-up of left Achilles tendinitis. Patient states in the last 3 weeks she had an increased flare in the left Achilles tendon which is tending to improve somewhat with stretching and over-the-counter ibuprofen. Patient was last evaluated for a similar problem on 06/17/2014 with a recommendation of stretching, wearing a shoe with a wedge heel and over-the-counter ibuprofen 600 mg 3 times a day for 30 days. Patient followed this treatment protocol and had reduced discomfort in the posterior right heel, however, still had persistent ongoing low-grade discomfort in the posterior right heel   Review of Systems  Musculoskeletal: Positive for joint swelling.       Objective:   Physical Exam  Orientated 3  Vascular: DP and PT pulses 2/4 bilaterally Capillary reflex immediate bilaterally  Neurological: Ankle reflex equal and reactive bilaterally Vibratory sensation reactive bilaterally  Dermatological: No open skin lesions bilaterally Well-healed surgical scars dorsal first MPJ bilaterally Keratoses plantar right  Musculoskeletal: There is mild palpable tenderness insertional area tendo Achilles left without any palpable defects Patient able to heel off unilaterally and bilaterally        Assessment & Plan:   Assessment: Recurrence of Achilles tendinitis left  Plan: Today review the results of the examination. I made patient aware that the Achilles tendinitis had reoccurred, however, most likely is more of a chronic problem. I described stretching and shoe wearing. Provider a more detailed stretching protocol female after visit summary. The symptoms seem to be improving I suggested that she try this stretching before using any further medication. If she felt a significant increase of the Achilles tendinitis I recommended over-the-counter  ibuprofen 200 mg tablets taking 3 tablets 3 times a day 7-14 days  Return as needed

## 2015-04-28 NOTE — Patient Instructions (Signed)
Wear shoes when do the stretches I'm okay with just stretching with your knee straight 2-3 times a day for 1-3 minutes If you have a sudden increase of pain okay to use over-the-counter 200 mg ibuprofen tablets, taking 3 of the 200 mg tablets 3 times a day 7-14 days  Achilles Tendinitis With Rehab Achilles tendinitis is a disorder of the Achilles tendon. The Achilles tendon connects the large calf muscles (Gastrocnemius and Soleus) to the heel bone (calcaneus). This tendon is sometimes called the heel cord. It is important for pushing-off and standing on your toes and is important for walking, running, or jumping. Tendinitis is often caused by overuse and repetitive microtrauma. SYMPTOMS  Pain, tenderness, swelling, warmth, and redness may occur over the Achilles tendon even at rest.  Pain with pushing off, or flexing or extending the ankle.  Pain that is worsened after or during activity. CAUSES   Overuse sometimes seen with rapid increase in exercise programs or in sports requiring running and jumping.  Poor physical conditioning (strength and flexibility or endurance).  Running sports, especially training running down hills.  Inadequate warm-up before practice or play or failure to stretch before participation.  Injury to the tendon. PREVENTION   Warm up and stretch before practice or competition.  Allow time for adequate rest and recovery between practices and competition.  Keep up conditioning.  Keep up ankle and leg flexibility.  Improve or keep muscle strength and endurance.  Improve cardiovascular fitness.  Use proper technique.  Use proper equipment (shoes, skates).  To help prevent recurrence, taping, protective strapping, or an adhesive bandage may be recommended for several weeks after healing is complete. PROGNOSIS   Recovery may take weeks to several months to heal.  Longer recovery is expected if symptoms have been prolonged.  Recovery is usually  quicker if the inflammation is due to a direct blow as compared with overuse or sudden strain. RELATED COMPLICATIONS   Healing time will be prolonged if the condition is not correctly treated. The injury must be given plenty of time to heal.  Symptoms can reoccur if activity is resumed too soon.  Untreated, tendinitis may increase the risk of tendon rupture requiring additional time for recovery and possibly surgery. TREATMENT   The first treatment consists of rest anti-inflammatory medication, and ice to relieve the pain.  Stretching and strengthening exercises after resolution of pain will likely help reduce the risk of recurrence. Referral to a physical therapist or athletic trainer for further evaluation and treatment may be helpful.  A walking boot or cast may be recommended to rest the Achilles tendon. This can help break the cycle of inflammation and microtrauma.  Arch supports (orthotics) may be prescribed or recommended by your caregiver as an adjunct to therapy and rest.  Surgery to remove the inflamed tendon lining or degenerated tendon tissue is rarely necessary and has shown less than predictable results. MEDICATION   Nonsteroidal anti-inflammatory medications, such as aspirin and ibuprofen, may be used for pain and inflammation relief. Do not take within 7 days before surgery. Take these as directed by your caregiver. Contact your caregiver immediately if any bleeding, stomach upset, or signs of allergic reaction occur. Other minor pain relievers, such as acetaminophen, may also be used.  Pain relievers may be prescribed as necessary by your caregiver. Do not take prescription pain medication for longer than 4 to 7 days. Use only as directed and only as much as you need.  Cortisone injections are rarely indicated. Cortisone  injections may weaken tendons and predispose to rupture. It is better to give the condition more time to heal than to use them. HEAT AND COLD  Cold is  used to relieve pain and reduce inflammation for acute and chronic Achilles tendinitis. Cold should be applied for 10 to 15 minutes every 2 to 3 hours for inflammation and pain and immediately after any activity that aggravates your symptoms. Use ice packs or an ice massage.  Heat may be used before performing stretching and strengthening activities prescribed by your caregiver. Use a heat pack or a warm soak. SEEK MEDICAL CARE IF:  Symptoms get worse or do not improve in 2 weeks despite treatment.  New, unexplained symptoms develop. Drugs used in treatment may produce side effects. EXERCISES RANGE OF MOTION (ROM) AND STRETCHING EXERCISES - Achilles Tendinitis  These exercises may help you when beginning to rehabilitate your injury. Your symptoms may resolve with or without further involvement from your physician, physical therapist or athletic trainer. While completing these exercises, remember:   Restoring tissue flexibility helps normal motion to return to the joints. This allows healthier, less painful movement and activity.  An effective stretch should be held for at least 30 seconds.  A stretch should never be painful. You should only feel a gentle lengthening or release in the stretched tissue. STRETCH - Gastroc, Standing   Place hands on wall.  Extend right / left leg, keeping the front knee somewhat bent.  Slightly point your toes inward on your back foot.  Keeping your right / left heel on the floor and your knee straight, shift your weight toward the wall, not allowing your back to arch.  You should feel a gentle stretch in the right / left calf. Hold this position for __________ seconds. Repeat __________ times. Complete this stretch __________ times per day. STRETCH - Soleus, Standing   Place hands on wall.  Extend right / left leg, keeping the other knee somewhat bent.  Slightly point your toes inward on your back foot.  Keep your right / left heel on the floor,  bend your back knee, and slightly shift your weight over the back leg so that you feel a gentle stretch deep in your back calf.  Hold this position for __________ seconds. Repeat __________ times. Complete this stretch __________ times per day. STRETCH - Gastrocsoleus, Standing  Note: This exercise can place a lot of stress on your foot and ankle. Please complete this exercise only if specifically instructed by your caregiver.   Place the ball of your right / left foot on a step, keeping your other foot firmly on the same step.  Hold on to the wall or a rail for balance.  Slowly lift your other foot, allowing your body weight to press your heel down over the edge of the step.  You should feel a stretch in your right / left calf.  Hold this position for __________ seconds.  Repeat this exercise with a slight bend in your knee. Repeat __________ times. Complete this stretch __________ times per day.  STRENGTHENING EXERCISES - Achilles Tendinitis These exercises may help you when beginning to rehabilitate your injury. They may resolve your symptoms with or without further involvement from your physician, physical therapist or athletic trainer. While completing these exercises, remember:   Muscles can gain both the endurance and the strength needed for everyday activities through controlled exercises.  Complete these exercises as instructed by your physician, physical therapist or athletic trainer. Progress the resistance  and repetitions only as guided.  You may experience muscle soreness or fatigue, but the pain or discomfort you are trying to eliminate should never worsen during these exercises. If this pain does worsen, stop and make certain you are following the directions exactly. If the pain is still present after adjustments, discontinue the exercise until you can discuss the trouble with your clinician. STRENGTH - Plantar-flexors   Sit with your right / left leg extended. Holding  onto both ends of a rubber exercise band/tubing, loop it around the ball of your foot. Keep a slight tension in the band.  Slowly push your toes away from you, pointing them downward.  Hold this position for __________ seconds. Return slowly, controlling the tension in the band/tubing. Repeat __________ times. Complete this exercise __________ times per day.  STRENGTH - Plantar-flexors   Stand with your feet shoulder width apart. Steady yourself with a wall or table using as little support as needed.  Keeping your weight evenly spread over the width of your feet, rise up on your toes.*  Hold this position for __________ seconds. Repeat __________ times. Complete this exercise __________ times per day.  *If this is too easy, shift your weight toward your right / left leg until you feel challenged. Ultimately, you may be asked to do this exercise with your right / left foot only. STRENGTH - Plantar-flexors, Eccentric  Note: This exercise can place a lot of stress on your foot and ankle. Please complete this exercise only if specifically instructed by your caregiver.   Place the balls of your feet on a step. With your hands, use only enough support from a wall or rail to keep your balance.  Keep your knees straight and rise up on your toes.  Slowly shift your weight entirely to your right / left toes and pick up your opposite foot. Gently and with controlled movement, lower your weight through your right / left foot so that your heel drops below the level of the step. You will feel a slight stretch in the back of your calf at the end position.  Use the healthy leg to help rise up onto the balls of both feet, then lower weight only on the right / left leg again. Build up to 15 repetitions. Then progress to 3 consecutive sets of 15 repetitions.*  After completing the above exercise, complete the same exercise with a slight knee bend (about 30 degrees). Again, build up to 15 repetitions. Then  progress to 3 consecutive sets of 15 repetitions.* Perform this exercise __________ times per day.  *When you easily complete 3 sets of 15, your physician, physical therapist or athletic trainer may advise you to add resistance by wearing a backpack filled with additional weight. STRENGTH - Plantar Flexors, Seated   Sit on a chair that allows your feet to rest flat on the ground. If necessary, sit at the edge of the chair.  Keeping your toes firmly on the ground, lift your right / left heel as far as you can without increasing any discomfort in your ankle. Repeat __________ times. Complete this exercise __________ times a day. *If instructed by your physician, physical therapist or athletic trainer, you may add ____________________ of resistance by placing a weighted object on your right / left knee.   This information is not intended to replace advice given to you by your health care provider. Make sure you discuss any questions you have with your health care provider.   Document Released: 09/14/2004  Document Revised: 03/06/2014 Document Reviewed: 05/28/2008 Elsevier Interactive Patient Education [employer].

## 2015-05-05 LAB — HM PAP SMEAR: HM Pap smear: NORMAL

## 2015-05-11 ENCOUNTER — Other Ambulatory Visit: Payer: Self-pay | Admitting: Internal Medicine

## 2015-05-11 DIAGNOSIS — Z1231 Encounter for screening mammogram for malignant neoplasm of breast: Secondary | ICD-10-CM

## 2015-06-16 ENCOUNTER — Ambulatory Visit
Admission: RE | Admit: 2015-06-16 | Discharge: 2015-06-16 | Disposition: A | Discharge status: Home / Self Care | Payer: No Typology Code available for payment source | Source: Ambulatory Visit | Attending: Internal Medicine | Admitting: Internal Medicine

## 2015-06-16 ENCOUNTER — Other Ambulatory Visit: Payer: Self-pay | Admitting: Internal Medicine

## 2015-06-16 DIAGNOSIS — Z1231 Encounter for screening mammogram for malignant neoplasm of breast: Secondary | ICD-10-CM

## 2015-06-16 DIAGNOSIS — C50912 Malignant neoplasm of unspecified site of left female breast: Secondary | ICD-10-CM

## 2015-06-16 LAB — HM MAMMOGRAPHY

## 2015-07-27 ENCOUNTER — Other Ambulatory Visit (INDEPENDENT_AMBULATORY_CARE_PROVIDER_SITE_OTHER): Payer: No Typology Code available for payment source

## 2015-07-27 ENCOUNTER — Ambulatory Visit (INDEPENDENT_AMBULATORY_CARE_PROVIDER_SITE_OTHER)
Admission: RE | Admit: 2015-07-27 | Discharge: 2015-07-27 | Disposition: A | Discharge status: Home / Self Care | Payer: No Typology Code available for payment source | Source: Ambulatory Visit | Attending: Internal Medicine | Admitting: Internal Medicine

## 2015-07-27 ENCOUNTER — Ambulatory Visit (INDEPENDENT_AMBULATORY_CARE_PROVIDER_SITE_OTHER): Payer: No Typology Code available for payment source | Admitting: Internal Medicine

## 2015-07-27 ENCOUNTER — Encounter: Payer: Self-pay | Admitting: Internal Medicine

## 2015-07-27 VITALS — BP 128/80 | HR 86 | Ht 65.0 in | Wt 219.0 lb

## 2015-07-27 DIAGNOSIS — E559 Vitamin D deficiency, unspecified: Secondary | ICD-10-CM

## 2015-07-27 DIAGNOSIS — R21 Rash and other nonspecific skin eruption: Secondary | ICD-10-CM | POA: Insufficient documentation

## 2015-07-27 DIAGNOSIS — D869 Sarcoidosis, unspecified: Secondary | ICD-10-CM

## 2015-07-27 DIAGNOSIS — Z0000 Encounter for general adult medical examination without abnormal findings: Secondary | ICD-10-CM

## 2015-07-27 DIAGNOSIS — K219 Gastro-esophageal reflux disease without esophagitis: Secondary | ICD-10-CM

## 2015-07-27 DIAGNOSIS — F17200 Nicotine dependence, unspecified, uncomplicated: Secondary | ICD-10-CM

## 2015-07-27 DIAGNOSIS — J4520 Mild intermittent asthma, uncomplicated: Secondary | ICD-10-CM | POA: Diagnosis not present

## 2015-07-27 DIAGNOSIS — F419 Anxiety disorder, unspecified: Secondary | ICD-10-CM | POA: Diagnosis not present

## 2015-07-27 LAB — BASIC METABOLIC PANEL
BUN: 9 mg/dL (ref 6–23)
CHLORIDE: 106 meq/L (ref 96–112)
CO2: 26 meq/L (ref 19–32)
Calcium: 9.8 mg/dL (ref 8.4–10.5)
Creatinine, Ser: 0.77 mg/dL (ref 0.40–1.20)
GFR: 97.88 mL/min (ref >60.00)
GLUCOSE: 101 mg/dL — AB (ref 70–99)
POTASSIUM: 4.3 meq/L (ref 3.5–5.1)
SODIUM: 139 meq/L (ref 135–145)

## 2015-07-27 LAB — CBC WITH DIFFERENTIAL/PLATELET
BASOS ABS: 0.1 10*3/uL (ref 0.0–0.1)
Basophils Relative: 0.6 % (ref 0.0–3.0)
EOS PCT: 2.8 % (ref 0.0–5.0)
Eosinophils Absolute: 0.3 10*3/uL (ref 0.0–0.7)
HCT: 39.1 % (ref 36.0–46.0)
HEMOGLOBIN: 12.6 g/dL (ref 12.0–15.0)
LYMPHS ABS: 1.9 10*3/uL (ref 0.7–4.0)
Lymphocytes Relative: 20.2 % (ref 12.0–46.0)
MCHC: 32.2 g/dL (ref 30.0–36.0)
MCV: 73.7 fl — ABNORMAL LOW (ref 78.0–100.0)
MONO ABS: 0.7 10*3/uL (ref 0.1–1.0)
MONOS PCT: 7.9 % (ref 3.0–12.0)
NEUTROS PCT: 68.5 % (ref 43.0–77.0)
Neutro Abs: 6.5 10*3/uL (ref 1.4–7.7)
Platelets: 232 10*3/uL (ref 150.0–400.0)
RBC: 5.3 Mil/uL — AB (ref 3.87–5.11)
RDW: 15 % (ref 11.5–15.5)
WBC: 9.5 10*3/uL (ref 4.0–10.5)

## 2015-07-27 LAB — HEPATIC FUNCTION PANEL
ALBUMIN: 4.1 g/dL (ref 3.5–5.2)
ALK PHOS: 94 U/L (ref 39–117)
ALT: 16 U/L (ref 0–35)
AST: 12 U/L (ref 0–37)
BILIRUBIN DIRECT: 0.1 mg/dL (ref 0.0–0.3)
TOTAL PROTEIN: 7.1 g/dL (ref 6.0–8.3)
Total Bilirubin: 0.4 mg/dL (ref 0.2–1.2)

## 2015-07-27 LAB — URINALYSIS, ROUTINE W REFLEX MICROSCOPIC
Bilirubin Urine: NEGATIVE
KETONES UR: NEGATIVE
Leukocytes, UA: NEGATIVE
NITRITE: NEGATIVE
SPECIFIC GRAVITY, URINE: 1.025 (ref 1.000–1.030)
Total Protein, Urine: NEGATIVE
Urine Glucose: NEGATIVE
Urobilinogen, UA: 0.2 (ref 0.0–1.0)
pH: 5.5 (ref 5.0–8.0)

## 2015-07-27 LAB — VITAMIN D 25 HYDROXY (VIT D DEFICIENCY, FRACTURES): VITD: 34.46 ng/mL (ref 30.00–100.00)

## 2015-07-27 LAB — HEPATITIS C ANTIBODY: HCV Ab: NEGATIVE

## 2015-07-27 LAB — LIPID PANEL
CHOL/HDL RATIO: 4
Cholesterol: 217 mg/dL — ABNORMAL HIGH (ref 0–200)
HDL: 61.8 mg/dL (ref >39.00)
LDL CALC: 132 mg/dL — AB (ref 0–99)
NONHDL: 155.45
TRIGLYCERIDES: 115 mg/dL (ref 0.0–149.0)
VLDL: 23 mg/dL (ref 0.0–40.0)

## 2015-07-27 LAB — TSH: TSH: 2.23 u[IU]/mL (ref 0.35–4.50)

## 2015-07-27 MED ORDER — ALPRAZOLAM 0.5 MG PO TABS: ORAL_TABLET | ORAL | Status: DC

## 2015-07-27 MED ORDER — CLOBETASOL PROPIONATE 0.05 % EX CREA: 1.0000 "application " | TOPICAL_CREAM | Freq: Two times a day (BID) | CUTANEOUS | Status: DC

## 2015-07-27 NOTE — Assessment & Plan Note (Addendum)
Smoker, sarcoidosis Proair MDI prn if worse D/c smoking.  CXR

## 2015-07-27 NOTE — Assessment & Plan Note (Signed)
Discussed the need to quit.   

## 2015-07-27 NOTE — Assessment & Plan Note (Signed)
We discussed age appropriate health related issues, including available/recomended screening tests and vaccinations. We discussed a need for adhering to healthy diet and exercise. Labs/EKG were reviewed/ordered. All questions were answered.   

## 2015-07-27 NOTE — Assessment & Plan Note (Signed)
Dr Jacolyn Reedy panic disorder On Buspar, bupropion Xanax prn 01/14/2023 sister died - grieving

## 2015-07-27 NOTE — Progress Notes (Signed)
Pre visit review using our clinic review tool, if applicable. No additional management support is needed unless otherwise documented below in the visit note. 

## 2015-07-27 NOTE — Assessment & Plan Note (Signed)
Chronic; in remission since the 90s Coughing now - CXR

## 2015-07-27 NOTE — Progress Notes (Signed)
Subjective:  Patient ID: Marilyn [name], female    DOB: Nov 04, 1955  Age: 60 y.o. MRN: [id number]  CC: Annual Exam   HPI Marilyn [name] presents for a well exam. C/o stress. C/o coughing spells and throat sensation x 2 mo off and on. Sister died last year - grieving. Parents are ill.    Outpatient Prescriptions Prior to Visit  Medication Sig Dispense Refill  . ALPRAZolam (XANAX) 0.5 MG tablet Take 1/2 to 1 tablet by mouth three times daily as needed for anxiety (Patient taking differently: Take 0.5-1 mg by mouth 3 (three) times daily as needed for anxiety. Take 1/2 to 1 tablet by mouth three times daily as needed for anxiety) 90 tablet 5  . anastrozole (ARIMIDEX) 1 MG tablet TAKE 1 TABLET (1 MG TOTAL) BY MOUTH DAILY. 90 tablet 1  . buPROPion (WELLBUTRIN XL) 150 MG 24 hr tablet TAKE 1 TABLET (150 MG TOTAL) BY MOUTH DAILY. 90 tablet 2  . busPIRone (BUSPAR) 10 MG tablet Take 10 mg by mouth 3 (three) times daily.  2  . Cholecalciferol 5000 UNITS TABS Take 1 tablet by mouth 3 (three) times a week.    . Clobetasol Propionate 0.05 % lotion Apply to affected areas 2-times per day for 2 weeks (Patient taking differently: Apply 1 application topically as needed (for itching / rash). Apply to affected areas 2-times per day for 2 weeks) 118 mL 1  . Multiple Vitamin (MULTIVITAMIN WITH MINERALS) TABS tablet Take 1 tablet by mouth daily.    Marland Kitchen ibuprofen (ADVIL,MOTRIN) 600 MG tablet Take 1 tablet (600 mg total) by mouth 3 (three) times daily. (Patient not taking: Reported on 07/27/2015) 90 tablet 0   No facility-administered medications prior to visit.    ROS Review of Systems  Constitutional: Negative for chills, activity change, appetite change, fatigue and unexpected weight change.  HENT: Negative for congestion, mouth sores and sinus pressure.   Eyes: Negative for visual disturbance.  Respiratory: Positive for cough. Negative for chest tightness.   Gastrointestinal: Negative for nausea and  abdominal pain.  Genitourinary: Negative for frequency, difficulty urinating and vaginal pain.  Musculoskeletal: Negative for back pain and gait problem.  Skin: Negative for pallor and rash.  Neurological: Negative for dizziness, tremors, weakness, numbness and headaches.  Psychiatric/Behavioral: Negative for suicidal ideas, confusion and sleep disturbance. The patient is nervous/anxious.   L arm w/3 papules in a cluster 1 cm each  Objective:  BP 128/80 mmHg  Pulse 86  Ht 5\' 5"  (1.651 m)  Wt 219 lb (99.338 kg)  BMI 36.44 kg/m2  SpO2 97%  BP Readings from Last 3 Encounters:  07/27/15 128/80  04/28/15 131/86  02/01/15 158/80    Wt Readings from Last 3 Encounters:  07/27/15 219 lb (99.338 kg)  02/01/15 215 lb (97.523 kg)  01/25/15 216 lb (97.977 kg)    Physical Exam  Constitutional: She appears well-developed. No distress.  HENT:  Head: Normocephalic.  Right Ear: External ear normal.  Left Ear: External ear normal.  Nose: Nose normal.  Mouth/Throat: Oropharynx is clear and moist.  Eyes: Conjunctivae are normal. Pupils are equal, round, and reactive to light. Right eye exhibits no discharge. Left eye exhibits no discharge.  Neck: Normal range of motion. Neck supple. No JVD present. No tracheal deviation present. No thyromegaly present.  Cardiovascular: Normal rate, regular rhythm and normal heart sounds.   Pulmonary/Chest: No stridor. No respiratory distress. She has no wheezes.  Abdominal: Soft. Bowel sounds are normal. She exhibits  no distension and no mass. There is no tenderness. There is no rebound and no guarding.  Musculoskeletal: She exhibits no edema or tenderness.  Lymphadenopathy:    She has no cervical adenopathy.  Neurological: She displays normal reflexes. No cranial nerve deficit. She exhibits normal muscle tone. Coordination normal.  Skin: No rash noted. No erythema.  Psychiatric: She has a normal mood and affect. Her behavior is normal. Judgment and thought  content normal.    Lab Results  Component Value Date   WBC 9.6 07/23/2014   HGB 12.9 07/23/2014   HCT 40.1 07/23/2014   PLT 239.0 07/23/2014   GLUCOSE 95 07/23/2014   CHOL 203* 07/23/2014   TRIG 123.0 07/23/2014   HDL 62.70 07/23/2014   LDLDIRECT 118.9 12/18/2011   LDLCALC 116* 07/23/2014   ALT 16 07/23/2014   AST 13 07/23/2014   NA 137 07/23/2014   K 4.6 07/23/2014   CL 106 07/23/2014   CREATININE 0.84 07/23/2014   BUN 16 07/23/2014   CO2 26 07/23/2014   TSH 1.63 07/23/2014    Mm Diag Breast Tomo Bilateral  06/16/2015  CLINICAL DATA:  History of left breast cancer in 2013 status post lumpectomy and radiation therapy. EXAM: 2D DIGITAL DIAGNOSTIC BILATERAL MAMMOGRAM WITH CAD AND ADJUNCT TOMO COMPARISON:  Previous exam(s). ACR Breast Density Category c: The breast tissue is heterogeneously dense, which may obscure small masses. FINDINGS: There are stable postsurgical changes within the left breast. There are no new dominant masses, suspicious calcifications or secondary signs of malignancy within either breast. Mammographic images were processed with CAD. IMPRESSION: No evidence of malignancy within either breast. Stable postsurgical changes within the left breast. RECOMMENDATION: Bilateral diagnostic mammogram in 1 year. I have discussed the findings and recommendations with the patient. Results were also provided in writing at the conclusion of the visit. If applicable, a reminder letter will be sent to the patient regarding the next appointment. BI-RADS CATEGORY  2: Benign. Electronically Signed   By: Franki Cabot M.D.   On: 06/16/2015 13:12    Assessment & Plan:   There are no diagnoses linked to this encounter. I am having Ms. [name] Marilyn maintain her ALPRAZolam, Clobetasol Propionate, multivitamin with minerals, busPIRone, ibuprofen, Cholecalciferol, buPROPion, and anastrozole.  No orders of the defined types were placed in this encounter.     Follow-up: No Follow-up on  file.  Walker Kehr, MD

## 2015-07-27 NOTE — Assessment & Plan Note (Signed)
5/17 L arm w/3 papules in a cluster 1 cm each ?bed bugs bites vs other Temovate cream bid

## 2015-07-31 ENCOUNTER — Encounter (HOSPITAL_COMMUNITY): Payer: Self-pay | Admitting: Emergency Medicine

## 2015-07-31 ENCOUNTER — Ambulatory Visit (HOSPITAL_COMMUNITY)
Admission: EM | Admit: 2015-07-31 | Discharge: 2015-07-31 | Disposition: A | Discharge status: Home / Self Care | Payer: No Typology Code available for payment source | Attending: Family Medicine | Admitting: Family Medicine

## 2015-07-31 DIAGNOSIS — M778 Other enthesopathies, not elsewhere classified: Secondary | ICD-10-CM

## 2015-07-31 DIAGNOSIS — M779 Enthesopathy, unspecified: Secondary | ICD-10-CM | POA: Diagnosis not present

## 2015-07-31 NOTE — Discharge Instructions (Signed)
Wear splint for a week. Take Ibuprofen 600-800 mg 3 times a day for 5-7 days. If the finger pain does not improve ovre the next week we have provided an orthopedic referral for you to arrange follow up with.   Tendinitis and Tenosynovitis  Tendinitis is inflammation of the tendon. Tenosynovitis is inflammation of the lining around the tendon (tendon sheath). These painful conditions often occur at once. Tendons attach muscle to bone. To move a limb, force from the muscle moves through the tendon, to the bone. These conditions often cause increased pain when moving. Tendinitis may be caused by a small or partial tear in the tendon.  SYMPTOMS   Pain, tenderness, redness, bruising, or swelling at the injury.  Loss of normal joint movement.  Pain that gets worse with use of the muscle and joint attached to the tendon.  Weakness in the tendon, caused by calcium build up that may occur with tendinitis.  Commonly affected tendons:  Achilles tendon (calf of leg).  Rotator cuff (shoulder joint).  Patellar tendon (kneecap to shin).  Peroneal tendon (ankle).  Posterior tibial tendon (inner ankle).  Biceps tendon (in front of shoulder). CAUSES   Sudden strain on a flexed muscle, muscle overuse, sudden increase or change in activity, vigorous activity.  Result of a direct hit (less common).  Poor muscle action (biomechanics). RISK INCREASES WITH:  Injury (trauma).  Too much exercise.  Sudden change in athletic activity.  Incorrect exercise form or technique.  Poor strength and flexibility.  Not warming-up properly before activity.  Returning to activity before healing is complete. PREVENTION   Warm-up and stretch properly before activity.  Maintain physical fitness:  Joint flexibility.  Muscle strength and endurance.  Fitness that increases heart rate.  Learn and use proper exercise techniques.  Use rehabilitation exercises to strengthen weak muscles and  tendons.  Ice the tendon after activity, to reduce recurring inflammation.  Wear proper fitting protective equipment for specific tendons, when indicated. PROGNOSIS  When treated properly, can be cured in 6 to 8 weeks. Recovery may take longer, depending on degree of injury.  RELATED COMPLICATIONS   Re-injury or recurring symptoms.  Permanent weakness or joint stiffness, if injury is severe and recovery is not completed.  Delayed healing, if sports are started before healing is complete.  Tearing apart (rupture) of the inflamed tendon. Tendinitis means the tendon is injured and must recover. TREATMENT  Treatment first involves ice, medicine, and rest from aggravating activities. This reduces pain and inflammation. Modifying your activity may be considered to prevent recurring injury. A brace, elastic bandage wrap, splint, cast, or sling may be prescribed to protect the joint for a short period. After that period, strengthening and stretching exercise may help to regain strength and full range of motion. If the condition persists, despite non-surgical treatment, surgery may be recommended to remove the inflamed tendon lining. Corticosteroid injections may be given to reduce inflammation. However, these injections may weaken the tendon and increase your risk for tendon rupture. MEDICATION   If pain medicine is needed, nonsteroidal anti-inflammatory medicines (aspirin and ibuprofen), or other minor pain relievers (acetaminophen), are often recommended.  Do not take pain medicine for 7 days before surgery.  Prescription pain relievers are usually prescribed only after surgery. Use only as directed and only as much as you need.  Ointments applied to the skin may be helpful.  Corticosteroid injections may be given to reduce inflammation. However, this may increase your risk of a tendon rupture. HEAT AND  COLD  Cold treatment (icing) relieves pain and reduces inflammation. Cold treatment  should be applied for 10 to 15 minutes every 2 to 3 hours, and immediately after activity that aggravates your symptoms. Use ice packs or an ice massage.  Heat treatment may be used before performing stretching and strengthening activities prescribed by your caregiver, physical therapist, or athletic trainer. Use a heat pack or a warm water soak. SEEK MEDICAL CARE IF:   Symptoms get worse or do not improve, despite treatment.  Pain becomes too much to tolerate.  You develop numbness or tingling.  Toes become cold, or toenails become blue, gray, or dark colored.  New, unexplained symptoms develop. (Drugs used in treatment may produce side effects.)   This information is not intended to replace advice given to you by your health care provider. Make sure you discuss any questions you have with your health care provider.   Document Released: 02/13/2005 Document Revised: 05/08/2011 Document Reviewed: 05/28/2008 Elsevier Interactive Patient Education [employer].

## 2015-07-31 NOTE — ED Provider Notes (Signed)
CSN: [id number]     Arrival date & time 07/31/15  1406 History   First MD Initiated Contact with Patient 07/31/15 1433     Chief Complaint  Patient presents with  . Hand Pain    Patient is a 60 y.o. female presenting with hand pain. The history is provided by the patient.  Hand Pain This is a new problem. The current episode started 12 to 24 hours ago. The problem occurs constantly. The problem has not changed since onset.The symptoms are aggravated by bending. She has tried nothing for the symptoms.  Reports onset of pain to the base of (L) 3rd finger last night that was notably worse this am. Pt noted swelling and slight redness as well. Denies injury, although states she did do some heavy cleaning yesterday afternoon. Pt reports h/o surgery on both hands for trigger finger.   Past Medical History  Diagnosis Date  . History of bronchitis   . History of sarcoidosis   . Cigarette smoker   . Diverticulosis of colon   . Colon polyp   . DJD (degenerative joint disease)   . Vitamin D deficiency   . Panic disorder   . Depression   . History of anemia   . Complication of anesthesia     DIFFICULTY AWAKENING  . Headache(784.0)   . Incisional breast wound     AT AGE 45 BILATERAL INCISION TO BREAST MADE  . Cancer ([place])     BREAST - left  . 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 Anxiety     panic disorder  . GERD (gastroesophageal reflux disease)     Pt. denies having GERD. Unable to remove it.  Marland Kitchen DJD (degenerative joint disease)   . Allergy   . Anemia     hx  . Leg swelling   . S/P radiation therapy 08/17/11 - 09/28/11    LLQ - 50 Gy/25 Fractions with Boost of 10 Gy / 5 fractions  . Sialoadenitis of submandibular gland 2016  . Hyperlipidemia     no meds needed   Past Surgical History  Procedure Laterality Date  . Total abdominal hysterectomy  2000    Dr Nori Riis  . Trigger finger release  2008    Dr Burney Gauze  .  Bunionectomy  2011    bilateral by Dr Little Ishikawa  . Tonsillectomy  1972  . Cyst removed from right hand  1995  . Colonoscopy      polyp  . Breast surgery  07/05/11    left breast lumpectomy with needle loc & axillary sln bx  . Biopsy breast      left breast  as a teenager  benign   Family History  Problem Relation Age of Onset  . Anemia Mother   . Other Mother     + H Pylori  . Prostate cancer Father   . Colon cancer Father     diagnosed 2009  . Cancer Father     colon and prostate cancer  . Breast cancer Sister     #1  . Cancer Sister 30    breast cancer, lung, pancreatic  . Sarcoidosis Sister     #2  . Multiple sclerosis Sister     #2  . Hypertension Brother     #1  . Cancer Brother     prostate cancer  . Prostate cancer Brother     #1  . Hyperlipidemia Brother     #  1  . Cancer Cousin     breast cancer  . Esophageal cancer Neg Hx   . Stomach cancer Neg Hx   . Rectal cancer Neg Hx    Social History  Substance Use Topics  . Smoking status: Current Every Day Smoker -- 0.25 packs/day    Types: Cigarettes    Last Attempt to Quit: 04/16/2012  . Smokeless tobacco: Never Used     Comment: trying to quit with wellbutrin  . Alcohol Use: 0.0 oz/week    0-1 Standard drinks or equivalent per week     Comment: social   OB History    Obstetric Comments   Menarche age 22, nulliparity, HRT x 1 year, Hysterectomy     Review of Systems  All other systems reviewed and are negative.   Allergies  Penicillins  Home Medications   Prior to Admission medications   Medication Sig Start Date End Date Taking? Authorizing Provider  ALPRAZolam Duanne Moron) 0.5 MG tablet Take 1/2 to 1 tablet by mouth three times daily as needed for anxiety 07/27/15  Yes Aleksei Plotnikov V, MD  anastrozole (ARIMIDEX) 1 MG tablet TAKE 1 TABLET (1 MG TOTAL) BY MOUTH DAILY. 04/01/15  Yes Nicholas Lose, MD  buPROPion (WELLBUTRIN XL) 150 MG 24 hr tablet TAKE 1 TABLET (150 MG TOTAL) BY MOUTH DAILY. 12/28/14   Yes Nicholas Lose, MD  busPIRone (BUSPAR) 10 MG tablet Take 10 mg by mouth 3 (three) times daily. 04/21/14  Yes Historical Provider, MD  Cholecalciferol 5000 UNITS TABS Take 1 tablet by mouth 3 (three) times a week.   Yes Historical Provider, MD  clobetasol cream (TEMOVATE) [id number] % Apply 1 application topically 2 (two) times daily. 07/27/15  Yes Aleksei Plotnikov V, MD  ibuprofen (ADVIL,MOTRIN) 600 MG tablet Take 1 tablet (600 mg total) by mouth 3 (three) times daily. 06/17/14  Yes Gean Birchwood, DPM  Multiple Vitamin (MULTIVITAMIN WITH MINERALS) TABS tablet Take 1 tablet by mouth daily.   Yes Historical Provider, MD   Meds Ordered and Administered this Visit  Medications - No data to display  BP 145/91 mmHg  Pulse 76  Temp(Src) 98.2 F (36.8 C) (Oral)  SpO2 98% No data found.   Physical Exam  Constitutional: She is oriented to person, place, and time. She appears well-developed and well-nourished.  HENT:  Head: Normocephalic.  Cardiovascular: Normal rate.   Pulmonary/Chest: Effort normal.  Musculoskeletal:       Left hand: She exhibits decreased range of motion, tenderness and swelling. She exhibits no bony tenderness, normal capillary refill and no deformity. Normal sensation noted.       Hands: Mild swelling and minimal erythema to base of (L) 3rd finger. No deformity. Pain w/ passive flexion of the finger.   Neurological: She is alert and oriented to person, place, and time.  Skin: Skin is warm and dry.    ED Course  Procedures (including critical care time)  Labs Review Labs Reviewed - No data to display  Imaging Review No results found.   Visual Acuity Review  Right Eye Distance:   Left Eye Distance:   Bilateral Distance:    Right Eye Near:   Left Eye Near:    Bilateral Near:         MDM   1. Tendonitis of finger   Dorsal finger splint x 1 week.  Ibuprofen 600-800 mg TID x 5-7 days. Ortho-hand f/u if not improved in a week or worsens before.     Rhetta Mura  Trevon Strothers, NP 07/31/15 1516

## 2015-07-31 NOTE — ED Notes (Signed)
The patient presented to the [facility] with a complaint of pain to her left middle finger that started last night. The patient denied any known injury.

## 2015-08-26 ENCOUNTER — Ambulatory Visit (INDEPENDENT_AMBULATORY_CARE_PROVIDER_SITE_OTHER): Payer: No Typology Code available for payment source | Admitting: Internal Medicine

## 2015-08-26 ENCOUNTER — Encounter: Payer: Self-pay | Admitting: Internal Medicine

## 2015-08-26 VITALS — BP 120/80 | HR 79 | Wt 220.0 lb

## 2015-08-26 DIAGNOSIS — F17200 Nicotine dependence, unspecified, uncomplicated: Secondary | ICD-10-CM

## 2015-08-26 DIAGNOSIS — F410 Panic disorder [episodic paroxysmal anxiety] without agoraphobia: Secondary | ICD-10-CM

## 2015-08-26 DIAGNOSIS — J441 Chronic obstructive pulmonary disease with (acute) exacerbation: Secondary | ICD-10-CM | POA: Diagnosis not present

## 2015-08-26 DIAGNOSIS — Z720 Tobacco use: Secondary | ICD-10-CM | POA: Diagnosis not present

## 2015-08-26 MED ORDER — TIOTROPIUM BROMIDE-OLODATEROL 2.5-2.5 MCG/ACT IN AERS: 2.0000 | INHALATION_SPRAY | Freq: Every day | RESPIRATORY_TRACT | Status: DC

## 2015-08-26 NOTE — Assessment & Plan Note (Signed)
Start Stiolto D/c smoking

## 2015-08-26 NOTE — Progress Notes (Signed)
Subjective:  Patient ID: Marilyn [name], female    DOB: [birth year]  Age: 60 y.o. MRN: [id number]  CC: No chief complaint on file.   HPI Marilyn [name] presents for COPD f/u. The pt has reduced her smoking. She is SOB w/exertion  Outpatient Prescriptions Prior to Visit  Medication Sig Dispense Refill  . ALPRAZolam (XANAX) 0.5 MG tablet Take 1/2 to 1 tablet by mouth three times daily as needed for anxiety 90 tablet 2  . anastrozole (ARIMIDEX) 1 MG tablet TAKE 1 TABLET (1 MG TOTAL) BY MOUTH DAILY. 90 tablet 1  . buPROPion (WELLBUTRIN XL) 150 MG 24 hr tablet TAKE 1 TABLET (150 MG TOTAL) BY MOUTH DAILY. 90 tablet 2  . busPIRone (BUSPAR) 10 MG tablet Take 10 mg by mouth 3 (three) times daily.  2  . Cholecalciferol 5000 UNITS TABS Take 1 tablet by mouth 3 (three) times a week.    . clobetasol cream (TEMOVATE) [id number] % Apply 1 application topically 2 (two) times daily. 60 g 3  . Multiple Vitamin (MULTIVITAMIN WITH MINERALS) TABS tablet Take 1 tablet by mouth daily.    Marland Kitchen ibuprofen (ADVIL,MOTRIN) 600 MG tablet Take 1 tablet (600 mg total) by mouth 3 (three) times daily. (Patient not taking: Reported on 08/26/2015) 90 tablet 0   No facility-administered medications prior to visit.    ROS Review of Systems  Constitutional: Negative for chills, activity change, appetite change, fatigue and unexpected weight change.  HENT: Negative for congestion, mouth sores and sinus pressure.   Eyes: Negative for visual disturbance.  Respiratory: Negative for cough and chest tightness.   Gastrointestinal: Negative for nausea and abdominal pain.  Genitourinary: Negative for frequency, difficulty urinating and vaginal pain.  Musculoskeletal: Negative for back pain and gait problem.  Skin: Negative for pallor and rash.  Neurological: Negative for dizziness, tremors, weakness, numbness and headaches.  Psychiatric/Behavioral: Negative for confusion and sleep disturbance.    Objective:  BP 120/80 mmHg   Pulse 79  Wt 220 lb (99.791 kg)  SpO2 95%  BP Readings from Last 3 Encounters:  08/26/15 120/80  07/31/15 145/91  07/27/15 128/80    Wt Readings from Last 3 Encounters:  08/26/15 220 lb (99.791 kg)  07/27/15 219 lb (99.338 kg)  02/01/15 215 lb (97.523 kg)    Physical Exam  Constitutional: She appears well-developed. No distress.  HENT:  Head: Normocephalic.  Right Ear: External ear normal.  Left Ear: External ear normal.  Nose: Nose normal.  Mouth/Throat: Oropharynx is clear and moist.  Eyes: Conjunctivae are normal. Pupils are equal, round, and reactive to light. Right eye exhibits no discharge. Left eye exhibits no discharge.  Neck: Normal range of motion. Neck supple. No JVD present. No tracheal deviation present. No thyromegaly present.  Cardiovascular: Normal rate, regular rhythm and normal heart sounds.   Pulmonary/Chest: No stridor. No respiratory distress. She has no wheezes.  Abdominal: Soft. Bowel sounds are normal. She exhibits no distension and no mass. There is no tenderness. There is no rebound and no guarding.  Musculoskeletal: She exhibits no edema or tenderness.  Lymphadenopathy:    She has no cervical adenopathy.  Neurological: She displays normal reflexes. No cranial nerve deficit. She exhibits normal muscle tone. Coordination normal.  Skin: No rash noted. No erythema.  Psychiatric: She has a normal mood and affect. Her behavior is normal. Judgment and thought content normal.  obese      I personally provided Stiolto inhaler use teaching. After the teaching patient was  able to demonstrate it's use effectively. All questions were answered  Lab Results  Component Value Date   WBC 9.5 07/27/2015   HGB 12.6 07/27/2015   HCT 39.1 07/27/2015   PLT 232.0 07/27/2015   GLUCOSE 101* 07/27/2015   CHOL 217* 07/27/2015   TRIG 115.0 07/27/2015   HDL 61.80 07/27/2015   LDLDIRECT 118.9 12/18/2011   LDLCALC 132* 07/27/2015   ALT 16 07/27/2015   AST 12  07/27/2015   NA 139 07/27/2015   K 4.3 07/27/2015   CL 106 07/27/2015   CREATININE 0.77 07/27/2015   BUN 9 07/27/2015   CO2 26 07/27/2015   TSH 2.23 07/27/2015    No results found.  Assessment & Plan:   There are no diagnoses linked to this encounter. I am having Marilyn [name] maintain her multivitamin with minerals, busPIRone, ibuprofen, Cholecalciferol, buPROPion, anastrozole, ALPRAZolam, and clobetasol cream.  No orders of the defined types were placed in this encounter.     Follow-up: No Follow-up on file.  Walker Kehr, MD

## 2015-08-26 NOTE — Progress Notes (Signed)
Pre visit review using our clinic review tool, if applicable. No additional management support is needed unless otherwise documented below in the visit note. 

## 2015-08-28 ENCOUNTER — Encounter: Payer: Self-pay | Admitting: Internal Medicine

## 2015-08-28 NOTE — Assessment & Plan Note (Signed)
Xanax prn  Potential benefits of a long term benzodiazepines  use as well as potential risks  and complications were explained to the patient and were aknowledged. 

## 2015-08-28 NOTE — Assessment & Plan Note (Signed)
The pt is trying to quit

## 2015-09-27 ENCOUNTER — Ambulatory Visit (INDEPENDENT_AMBULATORY_CARE_PROVIDER_SITE_OTHER): Payer: No Typology Code available for payment source | Admitting: Pulmonary Disease

## 2015-09-27 ENCOUNTER — Encounter: Payer: Self-pay | Admitting: Pulmonary Disease

## 2015-09-27 VITALS — BP 128/62 | HR 80 | Temp 98.0°F | Wt 221.2 lb

## 2015-09-27 DIAGNOSIS — F17200 Nicotine dependence, unspecified, uncomplicated: Secondary | ICD-10-CM

## 2015-09-27 DIAGNOSIS — F419 Anxiety disorder, unspecified: Secondary | ICD-10-CM | POA: Diagnosis not present

## 2015-09-27 DIAGNOSIS — Z720 Tobacco use: Secondary | ICD-10-CM

## 2015-09-27 DIAGNOSIS — J4520 Mild intermittent asthma, uncomplicated: Secondary | ICD-10-CM

## 2015-09-27 DIAGNOSIS — D869 Sarcoidosis, unspecified: Secondary | ICD-10-CM | POA: Diagnosis not present

## 2015-09-27 DIAGNOSIS — F410 Panic disorder [episodic paroxysmal anxiety] without agoraphobia: Secondary | ICD-10-CM

## 2015-09-27 MED ORDER — CLONAZEPAM 0.5 MG PO TABS: 0.5000 mg | ORAL_TABLET | Freq: Two times a day (BID) | ORAL | 5 refills | Status: DC | PRN

## 2015-09-27 NOTE — Progress Notes (Signed)
Subjective:    Patient ID: Marilyn [name], female    DOB: 02/08/1956, 60 y.o.   MRN: [id number]  HPI 60 y/o BF here for a follow up visit... she has multiple medical problems as noted below...  ~  SEE PREV EPIC NOTES FOR OLDER DATA >>    CXR 10/13 showed normal heart size, atherosclerotic calcif, clear lungs, surg clips within the left breast...  LABS 10/13:  FLP- on diet alone w/ TChol=214 LDL 119;  Chems- wnl;  CBC- ok w/ Hg=12.2 MCV=75 & rec to start on Fe;  TSH=1.65;  VitD=26  ~  June 17, 2012:  31moROV & [name], now better;  We reviewed the following medical problems during today's office visit >>     Bronchitis, Sarcoid, Smoker> still smoking 1/2-1ppd (tried under the tongue lozenge); on Flovent/ Proventil Bid, MucinexAllergy; notes mild cough, sm amt beige phlegm, no CP, +Sob/Doe w/o change & asked to incr exercise...    VI> trace edema; she knows to elim sodium, elev legs, wear support hose, not on diuretics as yet...    Chol> on diet alone; FLP 4/14 shows TChol 200, TG 100, HDL 65, LDL 115    Overweight> she increased from 160#=> 229# due to Zoloft rx she says, now down 5# to 224#; needs better diet & incr exercise...    GI- GERD, Divertics, IBS, Polyp, +FamHx> she continues to do well & denies abd pain, n/v, d/c, blood seen, etc...    Breast Cancer> stage 1 invasive ductal carcinoma, s/p lumpectomy & sentinel node bx (neg) 4/13, followed by XRT 6-8/13, [facility]/ [facility]137[facility]...    DJD, VitD> off Tramadol, on OTC meds as needed; +Calcium/ VitD1000; she notes some left hand paresthesias- rec wrist splint...    Headaches> no recent problems, she uses OTC meds as needed...    Anxiety, Panic> on Alpraz0.66m82mrn & WellbutrinXL150 ([facility] LPshe is under a lot of stress...    Anemia> CBC showed Hg 11.8=> 12.2 We reviewed prob list, meds, xrays and labs> see below for updates >>   LABS 4/14:  FLP- ok on  diet alone x LDL=115;  Chems- wnl;  CBC- ok w/ Hg=12.6 MCV=75;  TSH=1.40...  ~  June 23, 2013:  Yearly ROVOvaltes some cough, nasal congestion, drainage, clear mucus; she is still smoking ~5cig/d she says & asked to quit completely; we discussed Mucinex, Delsym, Tessalon... She's had a lot of side effects from Arimidex (stiffness, "I feel old", wt gain) and felt better off this med- but now she is back on it per drKJanice Norrie al...  We reviewed the following medical problems during today's office visit >>     Bronchitis, Sarcoid, Smoker> still smoking 1/4-1/2ppd, off inhalers, uses Mucinex prn; notes mild cough, sm amt clear phlegm, no CP, +Sob/Doe w/o change & asked to incr exercise, get wt down...    VI> trace edema; she knows to elim sodium, elev legs, wear support hose, not on diuretics as yet...    Chol> on diet alone; FLP 4/14 shows TChol 200, TG 100, HDL 65, LDL 115    Overweight> she increased from 160#=> 238# due to Zoloft & Arimidex she says; we reviewed diet, exercise, wt reduction strategies...    GI- GERD, Divertics, IBS, Polyp, +FamHx> she continues to do well & denies abd pain, n/v, d/c, blood seen, etc; last colon 2011 & f/u due 58yr73yr  [name]...    Breast Cancer> stage 1 invasive ductal carcinoma, s/p lumpectomy & sentinel node bx (neg) 4/13, followed by XRT 6-8/13, w/ Arimidex39m/d starting 8/13 per [name] & enduring the side effects...    DJD, VitD> off Tramadol, on OTC meds as needed; +Calcium/ VitD1000; she notes some left hand paresthesias- rec wrist splint trial...    Headaches> no recent problems, she uses OTC meds as needed...    Anxiety, Panic> on Alpraz0.58mprn & WellbutrinXL150 ([facility] she is under a lot of stress...    Anemia> CBCs in epic shows Hg ~12 range... We reviewed prob list, meds, xrays and labs> see below for updates >>   CXR 4/15 showed norm heart size, clear lungs, no adenopathy, NAD...  PFT 4/15 showed FVC=3.49 (127%), FEV1=2.45 (112%), %1sec=70,  mid-flows=61%predicted...  LABS 1/15-3/15: reviewed...   ~  September 27, 2015:  27 month ROV & pulmonary follow up visit>  MaTreneeemarried 04/2015 & her new last name is Marilyn [name] PCP is [name]...    60 y/o BF w/ a remote hx of "alveolar sarcoid" & she has been off Pred since 1992- see below;  She has been a mild cigarette smoker- generally <1/2ppd x yrs w/ ~30 pack-yr smoking hx; she tells me that she quit ~1 month ago!  She notes that was recently diagnosed w/ COPD based on a CXR from 06/2015 showing hyperinflation (otherw clear) per the radiologist;  However she had Spirometry 05/2013 showing superphysiologic numbers and just some small airways dis w/ mid-flows at 61% predicted;  She has not prev required inhalers, but she was given a trial of Stiolto- 2sp daily from [name] & she says she feels some better on this...    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    Marilyn [name] admit to quite a bit of stress over the yrs- Father is ill w/ hx colon cancer, prostate cancer, and bladder cancer; one sister passed at 66 64 hx breast cancer, lung cancer, and pancreatic cancer; one sister passed at 61 61om ?sarcoid, ?MS;  [name] has a hx anxiety & panic disorder on prn Alprazolam, also followed by [name] at the cancer center for hx breast cancer- on Arimidex...     See  problem list above...  EXAM shows Afeb, VSS, O2sat=99% on RA;  Wt=221#, 5'5"Tall, BMI=336-7;  HEENT- neg, mallampati1;  Chest- clear w/o w/r/r;  Heart- RR w/o m/r/g;  Abd- soft, nontender, neg;  Ext- neg w/o c/c/e;  Neuro- intact...  CXR 07/27/15>  Norm heart size, sl hyperinflation, clear lungs, left breast clips and scoliosis, NAD...  FullPFT 09/29/15>  FVC=3.04 (111%), FEV1=2.18 (102%), %1sec=72, mid-flows sl reduced at 70% predicted;  TLC=5.51 (105%), RV=2.60 (125%), RV/TLC=47%;  DLCO=58% predicted;  This is c/w min airflow obstruction mostly in the small airways, some mild air  trapping & decr in DLCO...   LABS 06/2015 in Epic> reviewed-- CBC is ok w/ Hg=12.6;  Chems- wnl;  Thyroid- wnl... IMP/PLAN>>  [name] has been a modest smoker & quit 77moago; her CXR report indicates sl hyperinflation (& clear) but her PFT again does not reveal much airflow obstruction, lung volumes show some mild air trapping & curiously her DLCO is mod reduced;  She has a remote hx of Sarcoidosis but at that time she had a very prominent CXR pattern of "alveolar sarcoid" which responded nicely to Pred & a slow taper;  We discussed proceeding w/ Hi-res CT Chest for further eval vs trial of KLONOPIN as a combination relaxer for her symptoms of SOB w/ short term follow up to assertain it's effect on her breathing;  She would like to try the latter & we will plan recheck in ~6wks...            Problem List:  PHYSICAL EXAMINATION (ICD-V70.0) - GYN= DrNeal who does her PAP smears, Mammograms (4/14- neg), and BMD... he also checked her VitD level and started VitD 50K weekly, then changed her to 2000 u daily... she is s/p hysterectomy in 2000... Note- she had an abd bruit investigated w/ an Abd MRA 6/08 and nothing found...  [facility]... Routine Mammogram 3/13 showed left breast lesion leading to surg by DrHoxworth, XRT by [name], Oncology f/u w/ DJanice Norrie..   Hx of BRONCHITIS (ICD-490) -  no recent episodes... Min cough & clear sputum... ~  Baseline CXR is clear and WNL (no residuals from her sarcoidosis) ~  Spirometry 7/06 showed FVC= 3.77 (130%), FEV1= 2.71 (113%), %1sec=72, mid-flows=66%... ~  CXR 11/12 showed normal heart size, min scarring, otherw clear, NAD..Marland Kitchen  ~  CXR 10/13 showed normal heart size, atherosclerotic calcif, clear lungs, surg clips within the left breast... ~  1/14: she presented w/ refractory cough/ AB- given Depo, Pred taper, & the usual OTC meds... ~  CXR 2/14 showed norm heart size, clear lungs, mild peribronch thickening, surg clips overlying left breast...  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 totally resolved on Pred therapy w/ normalization of CXR and no recurrence off Pred now since 1992... ~  CXR 9/10 remains clear & WNL..Marland Kitchen ~  CXR 11/11 remains clear & WNL..Marland Kitchen ~  CXR 11/12 remains clear, mild basilar atx, otherw WNL... ~  EKG 11/12 showed NSR, rate62, wnl, NAD..Marland Kitchen ~  CXR 10/13 showed normal heart size, atherosclerotic calcif, clear lungs, surg clips within the left breast... ~  CXR 2/14 showed norm heart size, clear lungs, mild peribronch thickening, surg clips overlying left breast... ~  CXR 4/15 showed norm heart size, clear lungs, no adenopathy, NAD... ~  PFT 4/15 showed FVC=3.49 (127%), FEV1=2.45 (112%), %1sec=70, mid-flows=61%predicted...  CIGARETTE SMOKER (ICD-305.1) - she was smoking 1/2 ppd> states she quit 10/12 & now back to 1/2-1ppd!!! ~  4/15: she admits to still smoking 1/4ppd & encouraged to quit completely...  VENOUS INSUFFICIENCY (ICD-459.81) - she follows a low salt diet, elevates when nec, and wears support hose Prn.  HYPERLIPIDEMIA (ICD-272.4) - hx of hyperlipidemia w/ high HDL's prev... ~  FDyer7/07 showed TChol 210, TG 56, HDL 81, LDL 101 ~  FLP 6/08 showed TChol 232, TG 88, HDL 83, LDL 112 ~  FLP 8/09 showed TChol 199, TG 85, HDL 68, LDL  115 ~  FLP  9/10 (wt=175#) showed TChol 231, TG 78, HDL 94, LDL 120 ~  FLP 11/11 (wt=197#) showed TChol 265, TG 93, HDL 89, LDL 143... needs better diet get wt down. ~  FLP 11/12 (wt=210#) showed TChol 238, TG 79, HDL 83, LDL 148... Offered meds but she wants to see how she does on her wt loss program. ~  FLP 3/13 (wt=229#) showed TChol 235, TG 89, HDL 85, LDL 130 ~  FLP 10/13 (wt=229#) showed TChol 214, TG 114, HDL 60, LDL 119  ~  FLP 4/14 (wt=224#) showed TChol 200, TG 100, HDL 65, LDL 115  GERD (ICD-530.81) - uses OTC Prilosec as needed.  DIVERTICULOSIS OF COLON (ICD-562.10) IRRITABLE BOWEL SYNDROME (ICD-564.1) COLONIC POLYPS (ICD-211.3) Family Hx of COLON CANCER (ICD-153.9) -  ~  colonoscopy 9/06 by Demetra Shiner was WNL- no divertics, no polyps... f/u planned 67yr. ~  Father was diagnosed w/ colon cancer in 203-01-09& being treated by oncology... pt will need colon f/u in 203-02-2009 ~  f/u colonoscopy 9/11 by [name] showed divertics & 2 polyps= polypoid mucosa, f/u planned 570yr  Hx of UTI (ICD-599.0)  BREAST CANCER >> Routine mammogram 3/13 showed a lesion on the left breast >> See above:  Surg- [name],  XRT- [name],  Oncology- [name]... 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 ~  1/14: she had f/u [facility]> stage 1 invasive ductal carcinoma, s/p lumpectomy & sentinel node bx (neg) 4/13, followed  by XRT 6-8/13, w/ Arimidex1m66m starting 8/13; doing satis & no changes made... ~  2/14: she had ROV [name]> doing satis, no problems... ~  1/15: she had f/u [name]> note reviewed, she was c/o some CTS & hip pain she believed was due to the Arimidex, they decided to HOLD it for 6-8wks & consider other med (eg-Femara/Tamoxifen), she felt better off the med but ultimately went back on it...  ~  Mammogram & Sonar 4/15 were neg- no evid of malignancy w/ post op changes noted...  DEGENERATIVE JOINT DISEASE (ICD-715.90) - she has mild DJD esp in knees & prev had cortisone shots from ortho- DrMcKinley... not on regular meds, she uses Tylenol Prn... ~  Oncology reported a BMD done by her GYN 4/13 w/ TScore -1.1 in left FemNeck... ~  4/15: follow up BMD has been ordered by Oncology, but not yet performed...  VITAMIN D DEFICIENCY (ICD-268.9) - followed by DrNeal & currently taking Vit D 1000 u caps- 2 daily. ~  Labs here 11/12 showed Vit D level = 37 ~  Labs 10/13 showed Vit D level = 26 ~  Labs 1/14 showed Vit D level = 44  HEADACHE (ICD-784.0)  Hx Panic Disorder> Improved w/o attacks in several yrs; she has ALPRAZOLAM0.5mg60mr prn use but seldom needs it she says... Hx of DEPRESSION (ICD-311) - husb passed away in [id number] sees DrMcKinney (MerGaffer ZOLOFT 100mg10m.  Hx of ANEMIA (ICD-285.9) -  Hg chronically in the 11-12 range... ~  labs 9/10 showed Hg= 11.9, MCV= 77, Fe= 76 (21%sat) ~  labs 11/11 showed Hg= 12.0, MCV= 76, Fe= 80 (20%sat) ~  Labs 11/12 showed Hg= 12.1, MCV= 75, Fe=68 (19%sat) ~  Labs 4/13 showed Hg= 11.2 - 12.6 ~  Labs 10/13 showed Hg= 12.2, MCV= 75, Rec to start Fe daily... ~  Labs 4/14 showed Hg= 12.4, MCV= 75, & rec to restart thew FeSO4 daily...   Past Surgical History:  Procedure Laterality Date  . BIOPSY BREAST     left breast  as a teenager  benign  . BREAST SURGERY  07/05/11   left breast lumpectomy with needle loc & axillary sln bx  . BUNIONECTOMY   2011   bilateral by Dr Little Ishikawa  . COLONOSCOPY     polyp  . cyst removed from right hand  1995  . TONSILLECTOMY  1972  . TOTAL ABDOMINAL HYSTERECTOMY  2000   Dr Nori Riis  . TRIGGER FINGER RELEASE  2008   Dr Burney Gauze    Outpatient Encounter Prescriptions as of 09/27/2015  Medication Sig Dispense Refill  . ALPRAZolam (XANAX) 0.5 MG tablet Take 1/2 to 1 tablet by mouth three times daily as needed for anxiety 90 tablet 2  . anastrozole (ARIMIDEX) 1 MG tablet TAKE 1 TABLET (1 MG TOTAL) BY MOUTH DAILY. 90 tablet 1  . buPROPion (WELLBUTRIN XL) 150 MG 24 hr tablet TAKE 1 TABLET (150 MG TOTAL) BY MOUTH DAILY. 90 tablet 2  . Cholecalciferol 5000 UNITS TABS Take 1 tablet by mouth 3 (three) times a week.    . clobetasol cream (TEMOVATE) 3.55 % Apply 1 application topically 2 (two) times daily. 60 g 3  . Multiple Vitamin (MULTIVITAMIN WITH MINERALS) TABS tablet Take 1 tablet by mouth daily.    . Tiotropium Bromide-Olodaterol (STIOLTO RESPIMAT) 2.5-2.5 MCG/ACT AERS Inhale 2 puffs into the lungs daily. 1 Inhaler 11  . busPIRone (BUSPAR) 10 MG tablet Take 10 mg by mouth 3 (three) times daily.  2  . [DISCONTINUED] ibuprofen (ADVIL,MOTRIN) 600 MG tablet Take 1 tablet (600 mg total) by mouth 3 (three) times daily. (Patient not taking: Reported on 09/27/2015) 90 tablet 0   No facility-administered encounter medications on file as of 09/27/2015.     Allergies  Allergen Reactions  . Penicillins Rash and Other (See Comments)    At injection site.    Current Medications, Allergies, Past Medical History, Past Surgical History, Family History, and Social History were reviewed in [employer] record.    Review of Systems    The patient notes some DOE- improved from last yr.  She denies fever, chills, sweats, anorexia, fatigue, weakness, malaise, weight loss, sleep disorder, blurring, diplopia, eye irritation, eye discharge, vision loss, eye pain, photophobia, earache, ear discharge,  tinnitus, decreased hearing, nasal congestion, nosebleeds, sore throat, hoarseness, chest pain, palpitations, syncope, orthopnea, PND, peripheral edema, cough, dyspnea at rest, excessive sputum, hemoptysis, wheezing, pleurisy, nausea, vomiting, diarrhea, constipation, change in bowel habits, abdominal pain, melena, hematochezia, jaundice, gas/bloating, indigestion/heartburn, dysphagia, odynophagia, dysuria, hematuria, urinary frequency, urinary hesitancy, nocturia, incontinence, back pain, joint pain, joint swelling, muscle cramps, muscle weakness, stiffness, arthritis, sciatica, restless legs, leg pain at night, leg pain with exertion, rash, itching, dryness, suspicious lesions, paralysis, paresthesias, seizures, tremors, vertigo, transient blindness, frequent falls, frequent headaches, difficulty walking, depression, anxiety, memory loss, confusion, cold intolerance, heat intolerance, polydipsia, polyphagia, polyuria, unusual weight change, abnormal bruising, bleeding, enlarged lymph nodes, urticaria, allergic rash, hay fever,  and recurrent infections.     Objective:   Physical Exam     WD, overweight, 60 y/o BF in NAD... GENERAL:  Alert & oriented; pleasant & cooperative... HEENT:  [place]/AT, EOM-wnl, PERRLA, EACs-clear, TMs-wnl, NOSE-clear, THROAT-clear & wnl. NECK:  Supple w/ full ROM; no JVD; normal carotid impulses w/o bruits; no thyromegaly or nodules palpated; no lymphadenopathy. CHEST:  Clear to P & A; w/o wheezing, rales, or rhonchi heard... HEART:  Regular Rhythm; without murmurs/ rubs/ or gallops. ABDOMEN:  Soft & nontender; normal bowel sounds; no organomegaly or masses detected... EXT: without deformities, mild arthritic changes; no varicose veins/ venous insuffic/ or edema. NEURO:  CN's intact; motor testing normal; sensory testing normal; gait normal & balance OK. DERM:  No lesions noted; no rash etc...  RADIOLOGY DATA:  Reviewed in the EPIC EMR & discussed w/ the  patient...  LABORATORY DATA:  Reviewed in the EPIC EMR & discussed w/ the patient...   Assessment & Plan:    Smoker, Hx Bronchits, Hx Sarcoid>  Hx of recurrent AB w/ refractory cough, congestion; she's had ZPak, Depo, Pred taper, Mucinex, Fluids & resolved; Sarcoid in remission, she quit smoking 07/2015... 09/27/15>   [name] has been a modest smoker & quit 47moago; her CXR report indicates sl hyperinflation (& clear) but her PFT again does not reveal much airflow obstruction, lung volumes show some mild air trapping & curiously her DLCO is mod reduced;  She has a remote hx of Sarcoidosis but at that time she had a very prominent CXR pattern of "alveolar sarcoid" which responded nicely to Pred & a slow taper;  We discussed proceeding w/ Hi-res CT Chest for further eval vs trial of KLONOPIN as a combination relaxer for her symptoms of SOB w/ short term follow up to assertain it's effect on her breathing;  She would like to try the latter & we will plan recheck in ~6wks.   Ven Insuffic>  Aware- avoid sodium, elevate, support hose...  Hyperlipid>  She doesn't want meds but weight is up substantially & needs to get wt down...  GI> GERD, Divertics, IBS, Polyps>  Stable on Rx, followed by [name] & up to date...  BREAST CANCER> followed by DJanice Norrieon Arimidex...  DJD>  She uses OTC analgesics as needed; notes incr symptoms w/ wt gain & hoping for improvement on diet plan...  Hx Depression>  She was followed by psychiatrist prev on Zoloft & on prn Alpraz which she seldom takes + Wellbutrin started by [employer]  Hx Anemia>  Hg stable ~12 w/ sm cells & Fe levels wnl; poss Thalassemia minor, it still wouldn't hurt to take Fe daily supplement...   Patient's Medications  New Prescriptions   CLONAZEPAM (KLONOPIN) 0.5 MG TABLET    Take 1 tablet (0.5 mg total) by mouth 2 (two) times daily as needed for anxiety.  Previous Medications   ALPRAZOLAM (XANAX) 0.5 MG TABLET    Take 1/2 to 1 tablet by mouth three  times daily as needed for anxiety   ANASTROZOLE (ARIMIDEX) 1 MG TABLET    TAKE 1 TABLET (1 MG TOTAL) BY MOUTH DAILY.   BUPROPION (WELLBUTRIN XL) 150 MG 24 HR TABLET    TAKE 1 TABLET (150 MG TOTAL) BY MOUTH DAILY.   BUSPIRONE (BUSPAR) 10 MG TABLET    Take 10 mg by mouth 3 (three) times daily.   CHOLECALCIFEROL 5000 UNITS TABS    Take 1 tablet by mouth 3 (three) times a week.   CLOBETASOL CREAM (TEMOVATE)  0.05 %    Apply 1 application topically 2 (two) times daily.   MULTIPLE VITAMIN (MULTIVITAMIN WITH MINERALS) TABS TABLET    Take 1 tablet by mouth daily.   TIOTROPIUM BROMIDE-OLODATEROL (STIOLTO RESPIMAT) 2.5-2.5 MCG/ACT AERS    Inhale 2 puffs into the lungs daily.  Modified Medications   No medications on file  Discontinued Medications   IBUPROFEN (ADVIL,MOTRIN) 600 MG TABLET    Take 1 tablet (600 mg total) by mouth 3 (three) times daily.

## 2015-09-27 NOTE — Patient Instructions (Signed)
Marilyn [name], it was a pleasure seeing you again...  Congrats on smoking cessation!!!  Your recent CXR does not show any signs of Sarcoidosis as you had yrs ago.  Your previous breathing tests did not reveal any signs of COPD.  We will recheck your FULL PFTs 7 call you w/ the results...  In the meanwhile-- I would like to start a "combination relaxer" to relax your chest wall muscles and allow you to get a deeper more satisfying inspiration...    Start the new KLONOPIN 0.5mg  one tab twice daily...  Call me for any questions...  Let's plan a follow up visit in 6wks or so.Marland KitchenMarland Kitchen

## 2015-09-29 ENCOUNTER — Encounter (INDEPENDENT_AMBULATORY_CARE_PROVIDER_SITE_OTHER): Payer: No Typology Code available for payment source | Admitting: Pulmonary Disease

## 2015-09-29 DIAGNOSIS — F17200 Nicotine dependence, unspecified, uncomplicated: Secondary | ICD-10-CM

## 2015-09-29 DIAGNOSIS — Z720 Tobacco use: Secondary | ICD-10-CM

## 2015-09-29 DIAGNOSIS — D869 Sarcoidosis, unspecified: Secondary | ICD-10-CM

## 2015-09-29 LAB — PULMONARY FUNCTION TEST
DL/VA % PRED: 59 %
DL/VA: 2.95 ml/min/mmHg/L
DLCO COR % PRED: 58 %
DLCO COR: 14.95 ml/min/mmHg
DLCO unc % pred: 58 %
DLCO unc: 15.04 ml/min/mmHg
FEF 25-75 POST: 1.42 L/s
FEF 25-75 Pre: 1.47 L/sec
FEF2575-%CHANGE-POST: -3 %
FEF2575-%PRED-POST: 68 %
FEF2575-%PRED-PRE: 70 %
FEV1-%Change-Post: 0 %
FEV1-%Pred-Post: 102 %
FEV1-%Pred-Pre: 102 %
FEV1-Post: 2.19 L
FEV1-Pre: 2.18 L
FEV1FVC-%CHANGE-POST: 8 %
FEV1FVC-%Pred-Pre: 90 %
FEV6-%Change-Post: -7 %
FEV6-%PRED-PRE: 114 %
FEV6-%Pred-Post: 106 %
FEV6-Post: 2.81 L
FEV6-Pre: 3.02 L
FEV6FVC-%Change-Post: 0 %
FEV6FVC-%Pred-Post: 103 %
FEV6FVC-%Pred-Pre: 102 %
FVC-%Change-Post: -7 %
FVC-%PRED-POST: 102 %
FVC-%PRED-PRE: 111 %
FVC-POST: 2.81 L
FVC-PRE: 3.04 L
POST FEV1/FVC RATIO: 78 %
POST FEV6/FVC RATIO: 100 %
PRE FEV1/FVC RATIO: 72 %
Pre FEV6/FVC Ratio: 99 %
RV % pred: 125 %
RV: 2.6 L
TLC % pred: 105 %
TLC: 5.51 L

## 2015-10-01 ENCOUNTER — Telehealth: Payer: Self-pay | Admitting: Pulmonary Disease

## 2015-10-01 NOTE — Telephone Encounter (Signed)
Spoke with pt and advised that per SN's recommendations at [facility] 09/27/15 he was adding klonopin to her medications to try and help with chest wall muscle relaxation. Advised pt that she needs to continue inhalers as directed. Nothing further needed.

## 2015-11-09 ENCOUNTER — Encounter: Payer: Self-pay | Admitting: Pulmonary Disease

## 2015-11-09 ENCOUNTER — Ambulatory Visit (INDEPENDENT_AMBULATORY_CARE_PROVIDER_SITE_OTHER): Payer: No Typology Code available for payment source | Admitting: Pulmonary Disease

## 2015-11-09 VITALS — BP 122/82 | HR 81 | Temp 97.2°F | Ht 65.0 in | Wt 221.0 lb

## 2015-11-09 DIAGNOSIS — D869 Sarcoidosis, unspecified: Secondary | ICD-10-CM

## 2015-11-09 DIAGNOSIS — Z23 Encounter for immunization: Secondary | ICD-10-CM | POA: Diagnosis not present

## 2015-11-09 DIAGNOSIS — R0600 Dyspnea, unspecified: Secondary | ICD-10-CM | POA: Insufficient documentation

## 2015-11-09 DIAGNOSIS — F419 Anxiety disorder, unspecified: Secondary | ICD-10-CM | POA: Diagnosis not present

## 2015-11-09 DIAGNOSIS — F17200 Nicotine dependence, unspecified, uncomplicated: Secondary | ICD-10-CM

## 2015-11-09 DIAGNOSIS — R0602 Shortness of breath: Secondary | ICD-10-CM

## 2015-11-09 NOTE — Progress Notes (Signed)
Subjective:    Patient ID: Marilyn [name], female    DOB: [birth year], 60 y.o.   MRN: [id number]  HPI 60 y/o BF ex-smoker who quit 07/2015 and followed w/ hx "alveolar" sarcoidosis (exquisitely sens to Pred rx), and recurrent bronchitic infections... Marilyn [name] remarried & her last name changed from Marilyn [name]=> Marilyn [name]... ~  SEE PREV EPIC NOTES FOR OLDER DATA >>    CXR 10/13 showed normal heart size, atherosclerotic calcif, clear lungs, surg clips within the left breast...  LABS 10/13:  FLP- on diet alone w/ TChol=214 LDL 119;  Chems- wnl;  CBC- ok w/ Hg=12.2 MCV=75 & rec to start on Fe;  TSH=1.65;  VitD=26  ~  June 17, 2012:  6moROV & [name] now better;  We reviewed the following medical problems during today's office visit >>     Bronchitis, Sarcoid, Smoker> still smoking 1/2-1ppd (tried under the tongue lozenge); on Flovent/ Proventil Bid, MucinexAllergy; notes mild cough, sm amt beige phlegm, no CP, +Sob/Doe w/o change & asked to incr exercise...    VI> trace edema; she knows to elim sodium, elev legs, wear support hose, not on diuretics as yet...    Chol> on diet alone; FLP 4/14 shows TChol 200, TG 100, HDL 65, LDL 115    Overweight> she increased from 160#=> 229# due to Zoloft rx she says, now down 5# to 224#; needs better diet & incr exercise...    GI- GERD, Divertics, IBS, Polyp, +FamHx> she continues to do well & denies abd pain, n/v, d/c, blood seen, etc...    Breast Cancer> stage 1 invasive ductal carcinoma, s/p lumpectomy & sentinel node bx (neg) 4/13, followed by XRT 6-8/13, w/ Arimidex160md starting 8/13 per Marilyn [name] doing well...    DJD, VitD> off Tramadol, on OTC meds as needed; +Calcium/ VitD1000; she notes some left hand paresthesias- rec wrist splint...    Headaches> no recent problems, she uses OTC meds as needed...    Anxiety, Panic> on Alpraz0.27m74mrn & WellbutrinXL150 ([facility] under a lot of stress...    Anemia>  CBC showed Hg 11.8=> 12.2 We reviewed prob list, meds, xrays and labs> see below for updates >>   LABS 4/14:  FLP- ok on diet alone x LDL=115;  Chems- wnl;  CBC- ok w/ Hg=12.6 MCV=75;  TSH=1.40...  ~  June 23, 2013:  Yearly ROVCrawfordtes some cough, nasal congestion, drainage, clear mucus; she is still smoking ~5cig/d she says & asked to quit completely; we discussed Mucinex, Delsym, Tessalon... She's had a lot of side effects from Arimidex (stiffness, "I feel old", wt gain) and felt better off this med- but now she is back on it per drKJanice Norrie al...  We reviewed the following medical problems during today's office visit >>     Bronchitis, Sarcoid, Smoker> still smoking 1/4-1/2ppd, off inhalers, uses Mucinex prn; notes mild cough, sm amt clear phlegm, no CP, +Sob/Doe w/o change & asked to incr exercise, get wt down...    VI> trace edema; she knows to elim sodium, elev legs, wear support hose, not on diuretics as yet...    Chol> on diet alone; FLP 4/14 shows TChol 200, TG 100, HDL 65, LDL 115    Overweight> she increased from 160#=> 238# due to Zoloft & Arimidex she says; we reviewed diet, exercise, wt reduction strategies...    GI- GERD, Divertics, IBS, Polyp, +FamHx> she continues to do well & denies  abd pain, n/v, d/c, blood seen, etc; last colon 2011 & f/u due 110yr Marilyn [name]...    Breast Cancer> stage 1 invasive ductal carcinoma, s/p lumpectomy & sentinel node bx (neg) 4/13, followed by XRT 6-8/13, w/ Arimidex149md starting 8/13 per Marilyn [name] & enduring the side effects...    DJD, VitD> off Tramadol, on OTC meds as needed; +Calcium/ VitD1000; she notes some left hand paresthesias- rec wrist splint trial...    Headaches> no recent problems, she uses OTC meds as needed...    Anxiety, Panic> on Alpraz0.62m30mrn & WellbutrinXL150 (per [place]ilyn [name] is under a lot of stress...    Anemia> CBCs in epic shows Hg ~12 range... We reviewed prob list, meds, xrays and labs> see below for updates >>   CXR 4/15  showed norm heart size, clear lungs, no adenopathy, NAD...  PFT 4/15 showed FVC=3.49 (127%), FEV1=2.45 (112%), %1sec=70, mid-flows=61%predicted...  LABS 1/15-3/15: reviewed...   ~  September 27, 2015:  27 month ROV & pulmonary follow up visit>  Marilyn [name] 04/2015 & her new last name is Marilyn [name]; her PCP is Marilyn [name]...    [age]o BF w/ a remote hx of "alveolar sarcoid" & she has been off Pred since 1992- see below;  She has been a mild cigarette smoker- generally <1/2ppd x yrs w/ ~30 pack-yr smoking hx; she tells me that she quit ~1 month ago!  She notes that was recently diagnosed w/ COPD based on a CXR from 06/2015 showing hyperinflation (otherw clear) per the radiologist;  However she had Spirometry 05/2013 showing superphysiologic numbers and just some small airways dis w/ mid-flows at 61% predicted;  She has not prev required inhalers, but she was given a trial of Stiolto- 2sp daily from Marilyn [name] & she says she feels some better on this...    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    Marilyn [name] to quite a bit of stress over the yrs- Father is ill w/ hx colon cancer, prostate cancer, and bladder cancer; one sister passed at 66 w48hx breast cancer, lung cancer, and pancreatic cancer; one sister passed at 61 f46m ?sarcoid, ?MS;  Marilyn [name] has a hx anxiety & panic  disorder on prn Alprazolam, also followed by [name] at the cancer center for hx breast cancer- on Arimidex...     See problem list above...  EXAM shows Afeb, VSS, O2sat=99% on RA;  Wt=221#, 5'5"Tall, BMI=36-7;  HEENT- neg, mallampati1;  Chest- clear w/o w/r/r;  Heart- RR w/o m/r/g;  Abd- soft, nontender, neg;  Ext- neg w/o c/c/e;  Neuro- intact...  CXR 07/27/15>  Norm heart size, sl hyperinflation, clear lungs, left breast clips and scoliosis, NAD...  FullPFT 09/29/15>  FVC=3.04 (111%), FEV1=2.18 (102%), %1sec=72, mid-flows sl reduced at 70% predicted;  TLC=5.51 (105%), RV=2.60 (125%), RV/TLC=47%;  DLCO=58% predicted;  This  is c/w min airflow obstruction mostly in the small airways, some mild air trapping & decr in DLCO...   LABS 06/2015 in Epic> reviewed-- CBC is ok w/ Hg=12.6;  Chems- wnl;  Thyroid- wnl... IMP/PLAN>>  Marilyn [name] has been a modest smoker & quit 79moago; her CXR report indicates sl hyperinflation (& clear) but her PFT again does not reveal much airflow obstruction, lung volumes show some mild air trapping & curiously her DLCO is mod reduced;  She has a remote hx of Sarcoidosis but at that time she had a very prominent CXR pattern of "alveolar sarcoid" which responded nicely to Pred & a slow taper;  We discussed proceeding w/ Hi-res CT Chest for further eval vs trial of KLONOPIN as a combination relaxer for her symptoms of SOB w/ short term follow up to assertain it's effect on her breathing;  She would like to try the latter & we will plan recheck in ~6wks...  ~  November 09, 2015:  6wk RClearviewreports that the Klonopin helped her breathing but 0.552mbid was too strong- sleepy; she has been under stress w/ family health issues; she denies cough, sput, hemoptysis, CP, palpit, edema; her SOB is much improved but she has had a relapse w/ smoking "I've had a few" she says...     EXAM shows Afeb, VSS, O2sat=98% on RA;  Wt=221#, 5'5"Tall, BMI=36-7;  HEENT- neg, mallampati1;  Chest- clear w/o  w/r/r;  Heart- RR w/o m/r/g;  Abd- soft, nontender, neg;  Ext- neg w/o c/c/e;  Neuro- intact... IMP/PLAN>>      Dyspnea> likely related to stress and "chest wall muscle spasm", improved on Klonopin0.44m59mpt asked to decr to 1/2 tab bid; she is on Stiolto per Marilyn [name]- 2sp daily & we discussed weaning... Given 2017 FLU vaccine today.    Hx Sarcoidosis in remission    Recurrent bronchitic infections    Cig smoker    PFTs w/ decr DLCO    Medical issues>  HL, GERD, Divertics/ IBS/ polyps & +FamHx colon Ca, Hx breast cancer on Arimidex, Hx anemia & Fe defic, Dysthymia on Wellbutrin & Klonopin...            Problem List:  PHYSICAL EXAMINATION (ICD-V70.0) - GYN= DrNeal who does her PAP smears, Mammograms (4/14- neg), and BMD... he also checked her VitD level and started VitD 50K weekly, then changed her to 2000 u daily... she is s/p hysterectomy in 2000... Note- she had an abd bruit investigated w/ an Abd MRA 6/08 and nothing found...  [facility]... Routine Mammogram 3/13 showed left breast lesion leading to surg by DrHoxworth, XRT by [name], Oncology f/u w/ DrKJanice Norrie   Hx of BRONCHITIS (ICD-490) - no recent episodes... Min cough & clear sputum... ~  Baseline CXR is clear and WNL (no residuals from her sarcoidosis) ~  Spirometry 7/06 showed FVC= 3.77 (130%), FEV1= 2.71 (113%), %1sec=72, mid-flows=66%... ~  CXR 11/12 showed normal heart size, min scarring, otherw clear, NAD... Marland Kitchen~  CXR 10/13 showed normal heart size, atherosclerotic calcif, clear lungs, surg clips within the left breast... ~  1/14: she presented w/ refractory cough/ AB- given Depo, Pred taper, & the usual OTC meds... ~  CXR 2/14 showed norm heart size, clear lungs, mild peribronch thickening, surg clips overlying left breast...  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 totally resolved on Pred  therapy  w/ normalization of CXR and no recurrence off Pred now since 1992... ~  CXR 9/10 remains clear & WNL.Marland Kitchen. ~  CXR 11/11 remains clear & WNL.Marland Kitchen. ~  CXR 11/12 remains clear, mild basilar atx, otherw WNL... ~  EKG 11/12 showed NSR, rate62, wnl, NAD.Marland Kitchen. ~  CXR 10/13 showed normal heart size, atherosclerotic calcif, clear lungs, surg clips within the left breast... ~  CXR 2/14 showed norm heart size, clear lungs, mild peribronch thickening, surg clips overlying left breast... ~  CXR 4/15 showed norm heart size, clear lungs, no adenopathy, NAD... ~  PFT 4/15 showed FVC=3.49 (127%), FEV1=2.45 (112%), %1sec=70, mid-flows=61%predicted...  CIGARETTE SMOKER (ICD-305.1) - she was smoking 1/2 ppd> states she quit 10/12 & now back to 1/2-1ppd!!! ~  4/15: she admits to still smoking 1/4ppd & encouraged to quit completely...  VENOUS INSUFFICIENCY (ICD-459.81) - she follows a low salt diet, elevates when nec, and wears support hose Prn.  HYPERLIPIDEMIA (ICD-272.4) - hx of hyperlipidemia w/ high HDL's prev... ~  [place] 7/07 showed TChol 210, TG 56, HDL 81, LDL 101 ~  FLP 6/08 showed TChol 232, TG 88, HDL 83, LDL 112 ~  FLP 8/09 showed TChol 199, TG 85, HDL 68, LDL 115 ~  FLP 9/10 (wt=175#) showed TChol 231, TG 78, HDL 94, LDL 120 ~  FLP 11/11 (wt=197#) showed TChol 265, TG 93, HDL 89, LDL 143... needs better diet get wt down. ~  FLP 11/12 (wt=210#) showed TChol 238, TG 79, HDL 83, LDL 148... Offered meds but she wants to see how she does on her wt loss program. ~  FLP 3/13 (wt=229#) showed TChol 235, TG 89, HDL 85, LDL 130 ~  FLP 10/13 (wt=229#) showed TChol 214, TG 114, HDL 60, LDL 119  ~  FLP 4/14 (wt=224#) showed TChol 200, TG 100, HDL 65, LDL 115  GERD (ICD-530.81) - uses OTC Prilosec as needed.  DIVERTICULOSIS OF COLON (ICD-562.10) IRRITABLE BOWEL SYNDROME (ICD-564.1) COLONIC POLYPS (ICD-211.3) Family Hx of COLON CANCER (ICD-153.9) -  ~  colonoscopy 9/06 by Demetra Shiner was WNL- no divertics, no polyps... f/u  planned 32yr. ~  Father was diagnosed w/ colon cancer in 2009 & being treated by oncology... pt will need colon f/u in 2011. ~  f/u colonoscopy 9/11 by Marilyn [name] showed divertics & 2 polyps= polypoid mucosa, f/u planned 552yr  Hx of UTI (ICD-599.0)  BREAST CANCER >> Routine mammogram 3/13 showed a lesion on the left breast >> See above:  Surg- [name],  XRT- [name],  Oncology- Marilyn [name]... 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 ~  1/14: she had f/u [facility]> stage 1 invasive ductal carcinoma, s/p lumpectomy & sentinel node bx (neg) 4/13, followed by XRT 6-8/13, w/ Arimidex1m2m starting 8/13; doing satis & no changes made... ~  2/14: she had ROV [name]> doing satis, no problems... ~  1/15: she had f/u Marilyn [name]> note reviewed, she was  c/o some CTS & hip pain she believed was due to the Arimidex, they decided to HOLD it for 6-8wks & consider other med (eg-Femara/Tamoxifen), she felt better off the med but ultimately went back on it...  ~  Mammogram & Sonar 4/15 were neg- no evid of malignancy w/ post op changes noted...  DEGENERATIVE JOINT DISEASE (ICD-715.90) - she has mild DJD esp in knees & prev had cortisone shots from ortho- DrMcKinley... not on regular meds, she uses Tylenol Prn... ~  Oncology reported a BMD done by her GYN 4/13 w/ TScore -1.1 in left FemNeck... ~  4/15: follow up BMD has been  ordered by Oncology, but not yet performed...  VITAMIN D DEFICIENCY (ICD-268.9) - followed by DrNeal & currently taking Vit D 1000 u caps- 2 daily. ~  Labs here 11/12 showed Vit D level = 37 ~  Labs 10/13 showed Vit D level = 26 ~  Labs 1/14 showed Vit D level = 44  HEADACHE (ICD-784.0)  Hx Panic Disorder> Improved w/o attacks in several yrs; she has ALPRAZOLAM0.33m for prn use but seldom needs it she says... Hx of DEPRESSION (ICD-311) - husb passed away in 228-Feb-2005.. she sees DrMcKinney ([place] on [address]...  Hx of ANEMIA (ICD-285.9) - Hg chronically in the 11-12 range... ~  labs 9/10 showed Hg= 11.9, MCV= 77, Fe= 76 (21%sat) ~  labs 11/11 showed Hg= 12.0, MCV= 76, Fe= 80 (20%sat) ~  Labs 11/12 showed Hg= 12.1, MCV= 75, Fe=68 (19%sat) ~  Labs 4/13 showed Hg= 11.2 - 12.6 ~  Labs 10/13 showed Hg= 12.2, MCV= 75, Rec to start Fe daily... ~  Labs 4/14 showed Hg= 12.4, MCV= 75, & rec to restart thew FeSO4 daily...   Past Surgical History:  Procedure Laterality Date  . BIOPSY BREAST     left breast  as a teenager  benign  . BREAST SURGERY  07/05/11   left breast lumpectomy with needle loc & axillary sln bx  . BUNIONECTOMY  20Feb 28, 2011 bilateral by Dr TuLittle Ishikawa. COLONOSCOPY     polyp  . cyst removed from right hand  1995  . TONSILLECTOMY  1972  . TOTAL ABDOMINAL HYSTERECTOMY  20Feb 28, 2000 Dr NeNori Riis. TRIGGER FINGER RELEASE  202008/02/28 Dr WeBurney Gauze  Outpatient Encounter Prescriptions as of 09/27/2015  Medication Sig Dispense Refill  . ALPRAZolam (XANAX) 0.5 MG tablet Take 1/2 to 1 tablet by mouth three times daily as needed for anxiety 90 tablet 2  . anastrozole (ARIMIDEX) 1 MG tablet TAKE 1 TABLET (1 MG TOTAL) BY MOUTH DAILY. 90 tablet 1  . buPROPion (WELLBUTRIN XL) 150 MG 24 hr tablet TAKE 1 TABLET (150 MG TOTAL) BY MOUTH DAILY. 90 tablet 2  . Cholecalciferol 5000 UNITS TABS Take 1 tablet by mouth 3 (three) times a week.    . clobetasol cream (TEMOVATE) 0.0.17 Apply 1 application  topically 2 (two) times daily. 60 g 3  . Multiple Vitamin (MULTIVITAMIN WITH MINERALS) TABS tablet Take 1 tablet by mouth daily.    . Tiotropium Bromide-Olodaterol (STIOLTO RESPIMAT) 2.5-2.5 MCG/ACT AERS Inhale 2 puffs into the lungs daily. 1 Inhaler 11  . busPIRone (BUSPAR) 10 MG tablet Take 10 mg by mouth 3 (three) times daily.  2  . [DISCONTINUED] ibuprofen (ADVIL,MOTRIN) 600 MG tablet Take 1 tablet (600 mg total) by mouth 3 (three) times daily. (Patient not taking: Reported on 09/27/2015) 90 tablet 0   No facility-administered encounter medications on  file as of 09/27/2015.     Allergies  Allergen Reactions  . Penicillins Rash and Other (See Comments)    At injection site.    Current Medications, Allergies, Past Medical History, Past Surgical History, Family History, and Social History were reviewed in [employer] record.    Review of Systems    The patient notes some DOE- improved from last yr.  She denies fever, chills, sweats, anorexia, fatigue, weakness, malaise, weight loss, sleep disorder, blurring, diplopia, eye irritation, eye discharge, vision loss, eye pain, photophobia, earache, ear discharge, tinnitus, decreased hearing, nasal congestion, nosebleeds, sore throat, hoarseness, chest pain, palpitations, syncope, orthopnea, PND, peripheral edema, cough, dyspnea at rest, excessive sputum, hemoptysis, wheezing, pleurisy, nausea, vomiting, diarrhea, constipation, change in bowel habits, abdominal pain, melena, hematochezia, jaundice, gas/bloating, indigestion/heartburn, dysphagia, odynophagia, dysuria, hematuria, urinary frequency, urinary hesitancy, nocturia, incontinence, back pain, joint pain, joint swelling, muscle cramps, muscle weakness, stiffness, arthritis, sciatica, restless legs, leg pain at night, leg pain with exertion, rash, itching, dryness, suspicious lesions, paralysis, paresthesias, seizures, tremors, vertigo, transient blindness, frequent falls,  frequent headaches, difficulty walking, depression, anxiety, memory loss, confusion, cold intolerance, heat intolerance, polydipsia, polyphagia, polyuria, unusual weight change, abnormal bruising, bleeding, enlarged lymph nodes, urticaria, allergic rash, hay fever, and recurrent infections.     Objective:   Physical Exam     WD, overweight, 60 y/o BF in NAD... GENERAL:  Alert & oriented; pleasant & cooperative... HEENT:  /AT, EOM-wnl, PERRLA, EACs-clear, TMs-wnl, NOSE-clear, THROAT-clear & wnl. NECK:  Supple w/ full ROM; no JVD; normal carotid impulses w/o bruits; no thyromegaly or nodules palpated; no lymphadenopathy. CHEST:  Clear to P & A; w/o wheezing, rales, or rhonchi heard... HEART:  Regular Rhythm; without murmurs/ rubs/ or gallops. ABDOMEN:  Soft & nontender; normal bowel sounds; no organomegaly or masses detected... EXT: without deformities, mild arthritic changes; no varicose veins/ venous insuffic/ or edema. NEURO:  CN's intact; motor testing normal; sensory testing normal; gait normal & balance OK. DERM:  No lesions noted; no rash etc...  RADIOLOGY DATA:  Reviewed in the EPIC EMR & discussed w/ the patient...  LABORATORY DATA:  Reviewed in the EPIC EMR & discussed w/ the patient...   Assessment & Plan:    Smoker, Hx Bronchits, Hx Sarcoid>  Hx of recurrent AB w/ refractory cough, congestion; she's had ZPak, Depo, Pred taper, Mucinex, Fluids & resolved; Sarcoid in remission, she quit smoking 07/2015... 09/27/15>   Marilyn [name] has been a modest smoker & quit 42moago; her CXR report indicates sl hyperinflation (& clear) but her PFT again does not reveal much airflow obstruction, lung volumes show some mild air trapping & curiously her DLCO is mod reduced;  She has a remote hx of Sarcoidosis but at that time she had a very prominent CXR pattern of "alveolar sarcoid" which responded nicely to Pred & a slow taper;  We discussed proceeding w/ Hi-res CT Chest for further eval vs trial of  KLONOPIN as a combination relaxer for her symptoms of SOB w/ short term follow up to assertain it's effect on her breathing;  She would like to try the latter & we will plan recheck in ~6wks. 11/09/15>   She is improved on the Klonopin but 0.556mis too strong- advised to decr to 1/2 tab bid, NO SMOKING, & ROV 74m8mo    Ven Insuffic>  Aware- avoid sodium, elevate, support hose...  Hyperlipid>  She doesn't want meds but weight is up substantially & needs to get wt down...Marland KitchenMarland Kitchen  GI> GERD, Divertics, IBS, Polyps>  Stable on Rx, followed by Marilyn [name] & up to date...  BREAST CANCER> followed by [name] on Arimidex...  DJD>  She uses OTC analgesics as needed; notes incr symptoms w/ wt gain & hoping for improvement on diet plan...  Hx Depression>  She was followed by psychiatrist prev on Zoloft & on prn Alpraz which she seldom takes + Wellbutrin started by [employer]...  Hx Anemia>  Hg stable ~12 w/ sm cells & Fe levels wnl; poss Thalassemia minor, it still wouldn't hurt to take Fe daily supplement...   Patient's Medications  New Prescriptions   No medications on file  Previous Medications   ALPRAZOLAM (XANAX) 0.5 MG TABLET    Take 1/2 to 1 tablet by mouth three times daily as needed for anxiety   ANASTROZOLE (ARIMIDEX) 1 MG TABLET    TAKE 1 TABLET (1 MG TOTAL) BY MOUTH DAILY.   BUPROPION (WELLBUTRIN XL) 150 MG 24 HR TABLET    TAKE 1 TABLET (150 MG TOTAL) BY MOUTH DAILY.   BUSPIRONE (BUSPAR) 10 MG TABLET    Take 10 mg by mouth 3 (three) times daily.   CHOLECALCIFEROL 5000 UNITS TABS    Take 1 tablet by mouth 3 (three) times a week.   CLOBETASOL CREAM (TEMOVATE) 0.05 %    Apply 1 application topically 2 (two) times daily.   CLONAZEPAM (KLONOPIN) 0.5 MG TABLET    Take 1 tablet (0.5 mg total) by mouth 2 (two) times daily as needed for anxiety.   MULTIPLE VITAMIN (MULTIVITAMIN WITH MINERALS) TABS TABLET    Take 1 tablet by mouth daily.   TIOTROPIUM BROMIDE-OLODATEROL (STIOLTO RESPIMAT) 2.5-2.5 MCG/ACT AERS     Inhale 2 puffs into the lungs daily.  Modified Medications   No medications on file  Discontinued Medications   No medications on file

## 2015-11-09 NOTE — Patient Instructions (Signed)
Today we updated your med list in our EPIC system...    Continue your current medications the same...  I am pleased that the [facility] has helped the shortness of breath... Sometimes it takes awhile to get the dosing right--    I rec that you try 1/2 tab twice daily > in the AM & around dinner time...  Continue your other meds the same...  And of course-- QUIT THE SMOKING!!!  Call for any questions...  Let's plan a follow up visit in 41mo, sooner if needed for breathing problems.Marland KitchenMarland Kitchen

## 2015-11-25 ENCOUNTER — Encounter: Payer: Self-pay | Admitting: Hematology and Oncology

## 2015-11-25 ENCOUNTER — Ambulatory Visit (HOSPITAL_BASED_OUTPATIENT_CLINIC_OR_DEPARTMENT_OTHER): Payer: No Typology Code available for payment source | Admitting: Hematology and Oncology

## 2015-11-25 DIAGNOSIS — N899 Noninflammatory disorder of vagina, unspecified: Secondary | ICD-10-CM | POA: Diagnosis not present

## 2015-11-25 DIAGNOSIS — C50412 Malignant neoplasm of upper-outer quadrant of left female breast: Secondary | ICD-10-CM | POA: Diagnosis not present

## 2015-11-25 MED ORDER — TAMOXIFEN CITRATE 20 MG PO TABS: 20.0000 mg | ORAL_TABLET | Freq: Every day | ORAL | 3 refills | Status: DC

## 2015-11-25 MED ORDER — ANASTROZOLE 1 MG PO TABS: 1.0000 mg | ORAL_TABLET | Freq: Every day | ORAL | 3 refills | Status: DC

## 2015-11-25 MED ORDER — BUPROPION HCL ER (XL) 150 MG PO TB24: ORAL_TABLET | ORAL | 3 refills | Status: DC

## 2015-11-25 NOTE — Progress Notes (Signed)
Patient Care Team: Cassandria Anger, MD as PCP - General (Internal Medicine) Jesusita Oka, RN as Registered Nurse Inda Castle, MD as Consulting Physician (Gastroenterology) Maisie Fus, MD as Consulting Physician (Obstetrics and Gynecology)  DIAGNOSIS: Breast cancer of upper-outer quadrant of left female breast [facility])   Staging form: Breast, AJCC 7th Edition   - Clinical: Stage IA (T1c, N0, cM0) - Unsigned         Staging comments: Staged In Breast Conference  4.17.13    - Pathologic: No stage assigned - Unsigned  SUMMARY OF ONCOLOGIC HISTORY:   Breast cancer of upper-outer quadrant of left female breast ([place])   06/20/2011 Surgery    Left breast lumpectomy with sentinel lymph node biopsy: 1 cm IDC, grade 1, ER positive, PR positive, HER-2 negative, Ki-67 6%      08/17/2011 - 09/28/2011 Radiation Therapy    Adjuvant radiation therapy      10/02/2011 -  Anti-estrogen oral therapy    Arimidex 1 mg daily started 10/02/2011 stopped March 2015 for pain and restarted August 2015       CHIEF COMPLIANT: Follow-up on Arimidex therapy  INTERVAL HISTORY: Marilyn [name] is a 60 year old with above-mentioned history of left breast cancer currently on Arimidex therapy. She is tolerating it fairly well except for vaginal dryness. She has pain with intercourse. Her gynecologist was recommending topical estrogen cream. She wanted to check with [place] whether that would be reasonable to consider. Her anastrozole is supposed to complete by August 2018.  REVIEW OF SYSTEMS:   Constitutional: Denies fevers, chills or abnormal weight loss Eyes: Denies blurriness of vision Ears, nose, mouth, throat, and face: Denies mucositis or sore throat Respiratory: Denies cough, dyspnea or wheezes Cardiovascular: Denies palpitation, chest discomfort Gastrointestinal:  Denies nausea, heartburn or change in bowel habits Skin: Denies abnormal skin rashes Lymphatics: Denies new lymphadenopathy or easy  bruising Neurological:Denies numbness, tingling or new weaknesses Behavioral/Psych: Mood is stable, no new changes  Extremities: No lower extremity edema Breast:  denies any pain or lumps or nodules in either breasts All other systems were reviewed with the patient and are negative.  I have reviewed the past medical history, past surgical history, social history and family history with the patient and they are unchanged from previous note.  ALLERGIES:  is allergic to penicillins.  MEDICATIONS:  Current Outpatient Prescriptions  Medication Sig Dispense Refill  . ALPRAZolam (XANAX) 0.5 MG tablet Take 1/2 to 1 tablet by mouth three times daily as needed for anxiety 90 tablet 2  . anastrozole (ARIMIDEX) 1 MG tablet TAKE 1 TABLET (1 MG TOTAL) BY MOUTH DAILY. 90 tablet 1  . buPROPion (WELLBUTRIN XL) 150 MG 24 hr tablet TAKE 1 TABLET (150 MG TOTAL) BY MOUTH DAILY. 90 tablet 2  . busPIRone (BUSPAR) 10 MG tablet Take 10 mg by mouth 3 (three) times daily.  2  . Cholecalciferol 5000 UNITS TABS Take 1 tablet by mouth 3 (three) times a week.    . clobetasol cream (TEMOVATE) 0.16 % Apply 1 application topically 2 (two) times daily. 60 g 3  . clonazePAM (KLONOPIN) 0.5 MG tablet Take 1 tablet (0.5 mg total) by mouth 2 (two) times daily as needed for anxiety. (Patient taking differently: Take 1/2 to 1 tablet by mouth two times daily) 60 tablet 5  . Multiple Vitamin (MULTIVITAMIN WITH MINERALS) TABS tablet Take 1 tablet by mouth daily.    . Tiotropium Bromide-Olodaterol (STIOLTO RESPIMAT) 2.5-2.5 MCG/ACT AERS Inhale 2 puffs into  the lungs daily. 1 Inhaler 11   No current facility-administered medications for this visit.     PHYSICAL EXAMINATION: ECOG PERFORMANCE STATUS: 1 - Symptomatic but completely ambulatory  Vitals:   11/25/15 0956  BP: 132/80  Pulse: 88  Resp: 18  Temp: 98.1 F (36.7 C)   Filed Weights   11/25/15 0956  Weight: 222 lb 6.4 oz (100.9 kg)    GENERAL:alert, no distress and  comfortable SKIN: skin color, texture, turgor are normal, no rashes or significant lesions EYES: normal, Conjunctiva are pink and non-injected, sclera clear OROPHARYNX:no exudate, no erythema and lips, buccal mucosa, and tongue normal  NECK: supple, thyroid normal size, non-tender, without nodularity LYMPH:  no palpable lymphadenopathy in the cervical, axillary or inguinal LUNGS: clear to auscultation and percussion with normal breathing effort HEART: regular rate & rhythm and no murmurs and no lower extremity edema ABDOMEN:abdomen soft, non-tender and normal bowel sounds MUSCULOSKELETAL:no cyanosis of digits and no clubbing  NEURO: alert & oriented x 3 with fluent speech, no focal motor/sensory deficits EXTREMITIES: No lower extremity edema BREAST: No palpable masses or nodules in either right or left breasts. No palpable axillary supraclavicular or infraclavicular adenopathy no breast tenderness or nipple discharge. (exam performed in the presence of a chaperone)  LABORATORY DATA:  I have reviewed the data as listed   Chemistry      Component Value Date/Time   NA 139 07/27/2015 0911   NA 138 11/04/2013 0929   K 4.3 07/27/2015 0911   K 3.8 11/04/2013 0929   CL 106 07/27/2015 0911   CL 107 03/22/2012 0853   CO2 26 07/27/2015 0911   CO2 23 11/04/2013 0929   BUN 9 07/27/2015 0911   BUN 12.1 11/04/2013 0929   CREATININE 0.77 07/27/2015 0911   CREATININE 0.8 11/04/2013 0929      Component Value Date/Time   CALCIUM 9.8 07/27/2015 0911   CALCIUM 9.6 11/04/2013 0929   ALKPHOS 94 07/27/2015 0911   ALKPHOS 108 11/04/2013 0929   AST 12 07/27/2015 0911   AST 12 11/04/2013 0929   ALT 16 07/27/2015 0911   ALT 13 11/04/2013 0929   BILITOT 0.4 07/27/2015 0911   BILITOT 0.28 11/04/2013 0929       Lab Results  Component Value Date   WBC 9.5 07/27/2015   HGB 12.6 07/27/2015   HCT 39.1 07/27/2015   MCV 73.7 (L) 07/27/2015   PLT 232.0 07/27/2015   NEUTROABS 6.5 07/27/2015    ASSESSMENT & PLAN:  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 Plan to treat her until December 2018.  Arimidex toxicities 1. Mild muscle aches and pains 2. Bone density done 2015 showed a T score -1.3 osteopenia: Continue with calcium and vitamin D and encouraged her to eat more yogurt. She takes vitamin D supplementation.  Breast cancer surveillance: 1. Breast exam 11/25/2015 is without any abnormalities 2. Mammograms 06/16/2015 Normal; Breast density C  Vaginal dryness: We discussed different options with the patient. She does not want to use any of the lubricants that are available. I discussed with her that vaginal estrogens may have very little systemic absorption and hence we  decided to keep her on anastrozole and that it was fine for her to use vaginal estrogen cream. Our hope is that once the anastrozole is complete, vaginal dryness may improve and she may not need to be on the vaginal estrogens or a very long time. We also discussed other options including vaginal laser therapy.  RTC in  1 year for follow-up.   No orders of the defined types were placed in this encounter.  The patient has a good understanding of the overall plan. she agrees with it. she will call with any problems that may develop before the next visit here.   Rulon Eisenmenger, MD 11/25/15

## 2015-11-25 NOTE — Assessment & Plan Note (Signed)
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 Plan to treat her until December 2018.  Arimidex toxicities 1. Mild muscle aches and pains 2. Bone density done 2015 showed a T score -1.3 osteopenia: Continue with calcium and vitamin D and encouraged her to eat more yogurt. She takes vitamin D supplementation.  Breast cancer surveillance: 1. Breast exam 11/25/2015 is without any abnormalities 2. Mammograms 06/16/2015 Normal; Breast density C  RTC in  1 year for follow-up.

## 2015-11-26 ENCOUNTER — Ambulatory Visit: Payer: No Typology Code available for payment source | Attending: Hematology and Oncology | Admitting: Physical Therapy

## 2015-11-26 DIAGNOSIS — M25512 Pain in left shoulder: Secondary | ICD-10-CM | POA: Diagnosis present

## 2015-11-26 DIAGNOSIS — R29898 Other symptoms and signs involving the musculoskeletal system: Secondary | ICD-10-CM | POA: Diagnosis not present

## 2015-11-26 NOTE — Patient Instructions (Addendum)
Over Head Pull: Narrow Grip        On back, knees bent, feet flat, band across thighs, elbows straight but relaxed. Pull hands apart (start). Keeping elbows straight, bring arms up and over head, hands toward floor. Keep pull steady on band. Hold momentarily. Return slowly, keeping pull steady, back to start. Repeat ___ times. Band color ______   Side Pull: Double Arm   On back, knees bent, feet flat. Arms perpendicular to body, shoulder level, elbows straight but relaxed. Pull arms out to sides, elbows straight. Resistance band comes across collarbones, hands toward floor. Hold momentarily. Slowly return to starting position. Repeat ___ times. Band color _____   Sash   On back, knees bent, feet flat, left hand on left hip, right hand above left. Pull right arm DIAGONALLY (hip to shoulder) across chest. Bring right arm along head toward floor. Hold momentarily. Slowly return to starting position. Repeat ___ times. Do with left arm. Band color ______   Shoulder Rotation: Double Arm   On back, knees bent, feet flat, elbows tucked at sides, bent 90, hands palms up. Pull hands apart and down toward floor, keeping elbows near sides. Hold momentarily. Slowly return to starting position. Repeat ___ times. Band color ______   [facility]  Walking is a great form of exercise to increase your strength, endurance and overall fitness.  A walking program can help you start slowly and gradually build endurance as you go.  Everyone's ability is different, so each person's starting point will be different.  You do not have to follow them exactly.  The are just samples. You should simply find out what's right for you and stick to that program.   In the beginning, you'll start off walking 2-3 times a day for short distances.  As you get stronger, you'll be walking further at just 1-2 times per day.  A. You Can Walk For A Certain Length Of Time Each Day    Walk 5 minutes 3 times per day.  Increase 2 minutes  every 2 days (3 times per day).  Work up to 25-30 minutes (1-2 times per day).   Example:   Day 1-2 5 minutes 3 times per day   Day 7-8 12 minutes 2-3 times per day   Day 13-14 25 minutes 1-2 times per day  B. You Can Walk For a Certain Distance Each Day     Distance can be substituted for time.    Example:   3 trips to mailbox (at road)   3 trips to corner of block   3 trips around the block  C. Go to local [employer] school and use the track.    Walk for distance ____ around track  Or time ____ minutes  D. Walk ____ Jog ____ Run ___  Please only do the exercises that your therapist has initialed and dated

## 2015-11-26 NOTE — Therapy (Signed)
[place], [place], [address] Phone: [phone]   Fax:  [phone]  Physical Therapy Evaluation  Patient Details  Name: Marilyn [name] MRN: [id number] Date of Birth: [birth year] Referring Provider: Nicholas Lose, MD  Encounter Date: 11/26/2015      PT End of Session - 11/26/15 1233    Visit Number 1   Number of Visits 1   PT Start Time 1119   PT Stop Time 1225   PT Time Calculation (min) 66 min   Activity Tolerance Patient tolerated treatment well   Behavior During Therapy [facility] for tasks assessed/performed      Past Medical History:  Diagnosis Date  . Allergy   . Anemia    hx  . Anxiety    panic disorder  . Breast cancer ([place]) 06/06/2011   bc  left breast 3 o'clock dx=invasive ductal ca uoqER/PR=positive  . Cancer ([place])    BREAST - left  . Cigarette smoker   . Colon polyp   . Complication of anesthesia    DIFFICULTY AWAKENING  . Depression   . Diverticulosis of colon   . DJD (degenerative joint disease)   . DJD (degenerative joint disease)   . GERD (gastroesophageal reflux disease)    Pt. denies having GERD. Unable to remove it.  Marland Kitchen Headache(784.0)   . History of anemia   . History of bronchitis   . History of sarcoidosis   . Hyperlipidemia    no meds needed  . Incisional breast wound    AT AGE 39 BILATERAL INCISION TO BREAST MADE  . Leg swelling   . Panic disorder   . S/P radiation therapy 08/17/11 - 09/28/11   LLQ - 50 Gy/25 Fractions with Boost of 10 Gy / 5 fractions  . Sialoadenitis of submandibular gland 2016  . Vitamin D deficiency     Past Surgical History:  Procedure Laterality Date  . BIOPSY BREAST     left breast  as a teenager  benign  . BREAST SURGERY  07/05/11   left breast lumpectomy with needle loc & axillary sln bx  . BUNIONECTOMY  2011   bilateral by Dr Little Ishikawa  . COLONOSCOPY     polyp  . cyst removed from right hand  1995  . TONSILLECTOMY  1972  . TOTAL  ABDOMINAL HYSTERECTOMY  2000   Dr Nori Riis  . TRIGGER FINGER RELEASE  2008   Dr Burney Gauze    There were no vitals filed for this visit.       Subjective Assessment - 11/26/15 1122    Subjective Main reason I'm here is because I've been telling doctors for the last 2 years about how I get shooting/jabbing pain in left arm and left chest when reaching down that occurs about twice a month. I've got swelling in the left hand and arm, get some tingling when sleeping. Get heaviness under the breast when first getting up. Swelling is worse in the morning.   Pertinent History Patient with a history of left breast cancer with left lumpectomy and sentinel lymph node biopsy in 2013. She also had radiation, but no chemotherapy. She is currently taking Arimidex.   Patient Stated Goals to not be bothered with the jabbing pain, get the swelling in left hand better   Currently in Pain? No/denies            [facility] PT Assessment - 11/26/15 0001      Assessment   Medical Diagnosis left breast  cancer   Referring Provider Nicholas Lose, MD   Onset Date/Surgical Date 06/20/11   Hand Dominance Right     Restrictions   Weight Bearing Restrictions No     Balance Screen   Has the patient fallen in the past 6 months No   Has the patient had a decrease in activity level because of a fear of falling?  No   Is the patient reluctant to leave their home because of a fear of falling?  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 (paralyzed in an accident and he just had a stroke)   Leisure no regular exercise     Cognition   Overall Cognitive Status Within Functional Limits for tasks  assessed     Observation/Other Assessments   Observations scapular dyskinesia noted on the left when performing bilateral shoulder scaption   Skin Integrity scar from lumpectomy insicion was mobile and not firm   Other Surveys  --  LLIS: 22% impairment     Posture/Postural Control   Posture/Postural Control Postural limitations   Postural Limitations Rounded Shoulders;Forward head     ROM / Strength   AROM / PROM / Strength AROM     AROM   AROM Assessment Site Shoulder   Right/Left Shoulder Right;Left   Right Shoulder Flexion 133 Degrees   Right Shoulder ABduction 147 Degrees   Right Shoulder External Rotation 71 Degrees   Left Shoulder Flexion 128 Degrees   Left Shoulder ABduction 132 Degrees   Left Shoulder External Rotation 64 Degrees     Strength   Right Shoulder Flexion 5/5   Right Shoulder ABduction 4+/5   Right Shoulder External Rotation 4+/5   Left Shoulder Flexion 5/5   Left Shoulder ABduction 4+/5   Left Shoulder External Rotation 4+/5   Right Elbow Flexion 5/5   Right Elbow Extension 5/5   Left Elbow Flexion 5/5   Left Elbow Extension 5/5     Ambulation/Gait   Ambulation/Gait Yes   Ambulation/Gait Assistance 7: Independent           LYMPHEDEMA/ONCOLOGY QUESTIONNAIRE - 11/26/15 1153      Right Upper Extremity Lymphedema   15 cm Proximal to Olecranon Process 37.5 cm   10 cm Proximal to Olecranon Process 36 cm   Olecranon Process 29 cm   15 cm Proximal to Ulnar Styloid Process 28.6 cm   10 cm Proximal to Ulnar Styloid Process 25.5 cm   Just Proximal to Ulnar Styloid Process 16 cm   Across Hand at [employer] 19.4 cm   At [place] of 2nd Digit 6.5 cm     Left Upper Extremity Lymphedema   15 cm Proximal to Olecranon Process 37 cm   10 cm Proximal to Olecranon Process 36.5 cm   Olecranon Process 29 cm   15 cm Proximal to Ulnar Styloid Process 28.3 cm   10 cm Proximal to Ulnar Styloid Process 24 cm   Just Proximal to Ulnar Styloid Process 16.1 cm    Across Hand at [employer] 19.5 cm   At [place] of 2nd Digit 6.5 cm  [facility] Adult PT Treatment/Exercise - 11/26/15 0001      Exercises   Exercises Shoulder     Shoulder Exercises: Supine   Horizontal ABduction Strengthening;Both;5 reps;Theraband   Theraband Level (Shoulder Horizontal ABduction) Level 1 (Yellow)   External Rotation Strengthening;Both;5 reps;Theraband   Theraband Level (Shoulder External Rotation) Level 1 (Yellow)   Flexion Strengthening;Both;5 reps;Theraband  narrow and wide grip   Theraband Level (Shoulder Flexion) Level 1 (Yellow)   Other Supine Exercises bil UE D2 with yellow theraband for 5 reps each                PT Education - 11/26/15 1204    Education provided Yes   Education Details supine scapular series for performance at home with yellow theraband and how to progress; starting a walking program   Person(s) Educated Patient   Methods Explanation;Demonstration;Handout   Comprehension Verbalized understanding;Returned demonstration                [place] - 11/26/15 1243      CC Long Term Goal  #1   Title Patient will be independent in HEP for scapular strengthening.   Status Achieved            Plan - 11/26/15 1233    Clinical Impression Statement Patient is a 60 year old female with history of left lumpectomy in 2013 with radiation. She reports a jabbing pain that occurs occasionally across left shoulder and chest when reaching down. She also reports swelling in her left hand and arm that is worse in the morning which she believes is a result of her medication. She demonstrates good bilateral UE strength and AROM with no pain currently and her bilateral UE circumference measurements are unremarkable. She does have some left scapular dyskinesia with shoulder elevation that could be improved with scapular stabilization exercises. Therapist instructed patient in supine scapular exercises and  patient performed those in the clinic today. Therapist also instructed patient in how to progress those exercises, starting a walking program, and provided the patient with a flyer on the Independence program at the [facility]. Patient has previously participated in the [place] program and is interested in beginning this again if able.   PT Frequency One time visit   PT Treatment/Interventions ADLs/Self Care Home Management;Patient/family education;Therapeutic exercise   PT Next Visit Plan patient currently does not have a need for therapy   Consulted and Agree with Plan of Care Patient      Patient will benefit from skilled therapeutic intervention in order to improve the following deficits and impairments:  Impaired UE functional use, Increased edema, Postural dysfunction  Visit Diagnosis: Other symptoms and signs involving the musculoskeletal system  Pain in left shoulder     Problem List Patient Active Problem List   Diagnosis Date Noted  . Dyspnea 11/09/2015  . Tobacco smoker within last 12 months 08/28/2015  . COPD exacerbation ([place]) 08/26/2015  . Rash and nonspecific skin eruption 07/27/2015  . Well adult exam 07/23/2014  . Sialoadenitis 06/02/2014  . Edema 07/07/2013  . AB (asthmatic bronchitis) 06/23/2013  . Anxiety   . Breast cancer of upper-outer quadrant of left female breast ([place]) 06/09/2011  . COLONIC POLYPS 01/25/2010  . DIVERTICULOSIS OF COLON 01/25/2010  . DEGENERATIVE JOINT DISEASE 11/06/2008  . PANIC DISORDER 07/23/2008  . Sarcoidosis ([place]) 10/13/2007  . Vitamin D deficiency 10/13/2007  . ANEMIA 10/13/2007  . CIGARETTE SMOKER 10/13/2007  . BRONCHITIS 10/13/2007  . UTI 10/13/2007  . HYPERLIPIDEMIA 04/23/2007  .  Adjustment disorder with mixed anxiety and depressed mood 04/23/2007  . Venous (peripheral) insufficiency 04/23/2007  . GERD 04/23/2007  . HEADACHE 04/23/2007    [facility] Life 11/26/2015, 12:46 PM  [place], [place], [address] Phone: [phone]   Fax:  [phone]  Name: TYLAH GULLATT MRN: [id number] Date of Birth: 1955/06/27  Saverio Danker, SPT

## 2015-11-30 ENCOUNTER — Ambulatory Visit (INDEPENDENT_AMBULATORY_CARE_PROVIDER_SITE_OTHER): Payer: No Typology Code available for payment source | Admitting: Internal Medicine

## 2015-11-30 ENCOUNTER — Encounter: Payer: Self-pay | Admitting: Internal Medicine

## 2015-11-30 DIAGNOSIS — J4520 Mild intermittent asthma, uncomplicated: Secondary | ICD-10-CM | POA: Diagnosis not present

## 2015-11-30 DIAGNOSIS — J441 Chronic obstructive pulmonary disease with (acute) exacerbation: Secondary | ICD-10-CM | POA: Diagnosis not present

## 2015-11-30 DIAGNOSIS — D869 Sarcoidosis, unspecified: Secondary | ICD-10-CM | POA: Diagnosis not present

## 2015-11-30 DIAGNOSIS — K219 Gastro-esophageal reflux disease without esophagitis: Secondary | ICD-10-CM

## 2015-11-30 DIAGNOSIS — F410 Panic disorder [episodic paroxysmal anxiety] without agoraphobia: Secondary | ICD-10-CM

## 2015-11-30 DIAGNOSIS — C50912 Malignant neoplasm of unspecified site of left female breast: Secondary | ICD-10-CM

## 2015-11-30 MED ORDER — TIOTROPIUM BROMIDE-OLODATEROL 2.5-2.5 MCG/ACT IN AERS: 2.0000 | INHALATION_SPRAY | Freq: Every day | RESPIRATORY_TRACT | 11 refills | Status: DC

## 2015-11-30 NOTE — Assessment & Plan Note (Signed)
[place]

## 2015-11-30 NOTE — Assessment & Plan Note (Signed)
On Arimidex  2013: left-sided breast cancer treated with lumpectomy and radiation and is currently on oral antiestrogen therapy with Arimidex

## 2015-11-30 NOTE — Assessment & Plan Note (Signed)
Doing fair 

## 2015-11-30 NOTE — Assessment & Plan Note (Signed)
Not on Rx 

## 2015-11-30 NOTE — Assessment & Plan Note (Signed)
Xanax prn  Potential benefits of a long term benzodiazepines  use as well as potential risks  and complications were explained to the patient and were aknowledged. 

## 2015-11-30 NOTE — Progress Notes (Signed)
Subjective:  Patient ID: Marilyn [name], female    DOB: 23-Apr-1955  Age: 60 y.o. MRN: [id number]  CC: No chief complaint on file.   HPI ZENIA OCHOA presents for depression, anxiety, breast cancer f/u. Her nefew  had a CVA  Outpatient Medications Prior to Visit  Medication Sig Dispense Refill  . ALPRAZolam (XANAX) 0.5 MG tablet Take 1/2 to 1 tablet by mouth three times daily as needed for anxiety 90 tablet 2  . anastrozole (ARIMIDEX) 1 MG tablet Take 1 tablet (1 mg total) by mouth daily. 90 tablet 3  . buPROPion (WELLBUTRIN XL) 150 MG 24 hr tablet TAKE 1 TABLET (150 MG TOTAL) BY MOUTH DAILY. 90 tablet 3  . busPIRone (BUSPAR) 10 MG tablet Take 10 mg by mouth 3 (three) times daily.  2  . Cholecalciferol 5000 UNITS TABS Take 1 tablet by mouth 3 (three) times a week.    . clobetasol cream (TEMOVATE) [id number] % Apply 1 application topically 2 (two) times daily. 60 g 3  . clonazePAM (KLONOPIN) 0.5 MG tablet Take 1 tablet (0.5 mg total) by mouth 2 (two) times daily as needed for anxiety. (Patient taking differently: Take 1/2 to 1 tablet by mouth two times daily) 60 tablet 5  . Multiple Vitamin (MULTIVITAMIN WITH MINERALS) TABS tablet Take 1 tablet by mouth daily.    . Tiotropium Bromide-Olodaterol (STIOLTO RESPIMAT) 2.5-2.5 MCG/ACT AERS Inhale 2 puffs into the lungs daily. 1 Inhaler 11   No facility-administered medications prior to visit.     ROS Review of Systems  Constitutional: Negative for activity change, appetite change, chills, fatigue and unexpected weight change.  HENT: Negative for congestion, mouth sores and sinus pressure.   Eyes: Negative for visual disturbance.  Respiratory: Negative for cough and chest tightness.   Gastrointestinal: Negative for abdominal pain and nausea.  Genitourinary: Negative for difficulty urinating, frequency and vaginal pain.  Musculoskeletal: Negative for back pain and gait problem.  Skin: Negative for pallor and rash.  Neurological: Negative  for dizziness, tremors, weakness, numbness and headaches.  Psychiatric/Behavioral: Negative for confusion and sleep disturbance.    Objective:  BP 140/70   Pulse 80   Wt 222 lb (100.7 kg)   SpO2 97%   BMI 36.94 kg/m   BP Readings from Last 3 Encounters:  11/30/15 140/70  11/25/15 132/80  11/09/15 122/82    Wt Readings from Last 3 Encounters:  11/30/15 222 lb (100.7 kg)  11/25/15 222 lb 6.4 oz (100.9 kg)  11/09/15 221 lb (100.2 kg)    Physical Exam  Constitutional: She appears well-developed. No distress.  HENT:  Head: Normocephalic.  Right Ear: External ear normal.  Left Ear: External ear normal.  Nose: Nose normal.  Mouth/Throat: Oropharynx is clear and moist.  Eyes: Conjunctivae are normal. Pupils are equal, round, and reactive to light. Right eye exhibits no discharge. Left eye exhibits no discharge.  Neck: Normal range of motion. Neck supple. No JVD present. No tracheal deviation present. No thyromegaly present.  Cardiovascular: Normal rate, regular rhythm and normal heart sounds.   Pulmonary/Chest: No stridor. No respiratory distress. She has no wheezes.  Abdominal: Soft. Bowel sounds are normal. She exhibits no distension and no mass. There is no tenderness. There is no rebound and no guarding.  Musculoskeletal: She exhibits no edema or tenderness.  Lymphadenopathy:    She has no cervical adenopathy.  Neurological: She displays normal reflexes. No cranial nerve deficit. She exhibits normal muscle tone. Coordination normal.  Skin: No rash  noted. No erythema.  Psychiatric: She has a normal mood and affect. Her behavior is normal. Judgment and thought content normal.  sad Obese  Lab Results  Component Value Date   WBC 9.5 07/27/2015   HGB 12.6 07/27/2015   HCT 39.1 07/27/2015   PLT 232.0 07/27/2015   GLUCOSE 101 (H) 07/27/2015   CHOL 217 (H) 07/27/2015   TRIG 115.0 07/27/2015   HDL 61.80 07/27/2015   LDLDIRECT 118.9 12/18/2011   LDLCALC 132 (H) 07/27/2015     ALT 16 07/27/2015   AST 12 07/27/2015   NA 139 07/27/2015   K 4.3 07/27/2015   CL 106 07/27/2015   CREATININE 0.77 07/27/2015   BUN 9 07/27/2015   CO2 26 07/27/2015   TSH 2.23 07/27/2015    No results found.  Assessment & Plan:   There are no diagnoses linked to this encounter. I am having Ms. Rawlinson maintain her multivitamin with minerals, busPIRone, Cholecalciferol, ALPRAZolam, clobetasol cream, Tiotropium Bromide-Olodaterol, clonazePAM, buPROPion, and anastrozole.  No orders of the defined types were placed in this encounter.    Follow-up: No Follow-up on file.  Walker Kehr, MD

## 2015-11-30 NOTE — Assessment & Plan Note (Signed)
On Stiolto

## 2015-11-30 NOTE — Progress Notes (Signed)
Pre visit review using our clinic review tool, if applicable. No additional management support is needed unless otherwise documented below in the visit note. 

## 2015-12-22 ENCOUNTER — Telehealth: Payer: Self-pay

## 2015-12-22 NOTE — Telephone Encounter (Signed)
PA APPROVED 12/22/2015 - 12/21/2016. Pharmacy advised via CoverMyMeds

## 2015-12-22 NOTE — Telephone Encounter (Signed)
PA initiated via CoverMyMeds key [id number]

## 2016-01-02 ENCOUNTER — Other Ambulatory Visit: Payer: Self-pay | Admitting: Hematology and Oncology

## 2016-03-13 ENCOUNTER — Encounter: Payer: Self-pay | Admitting: Pulmonary Disease

## 2016-03-13 ENCOUNTER — Ambulatory Visit (INDEPENDENT_AMBULATORY_CARE_PROVIDER_SITE_OTHER): Payer: No Typology Code available for payment source | Admitting: Pulmonary Disease

## 2016-03-13 VITALS — BP 134/68 | HR 86 | Temp 97.7°F | Ht 65.0 in | Wt 224.1 lb

## 2016-03-13 DIAGNOSIS — R0600 Dyspnea, unspecified: Secondary | ICD-10-CM | POA: Diagnosis not present

## 2016-03-13 DIAGNOSIS — D869 Sarcoidosis, unspecified: Secondary | ICD-10-CM | POA: Diagnosis not present

## 2016-03-13 DIAGNOSIS — Z6836 Body mass index (BMI) 36.0-36.9, adult: Secondary | ICD-10-CM

## 2016-03-13 DIAGNOSIS — F419 Anxiety disorder, unspecified: Secondary | ICD-10-CM | POA: Diagnosis not present

## 2016-03-13 DIAGNOSIS — E669 Obesity, unspecified: Secondary | ICD-10-CM | POA: Insufficient documentation

## 2016-03-13 DIAGNOSIS — F17200 Nicotine dependence, unspecified, uncomplicated: Secondary | ICD-10-CM | POA: Diagnosis not present

## 2016-03-13 DIAGNOSIS — E6609 Other obesity due to excess calories: Secondary | ICD-10-CM

## 2016-03-13 MED ORDER — UMECLIDINIUM-VILANTEROL 62.5-25 MCG/INH IN AEPB: 1.0000 | INHALATION_SPRAY | Freq: Every day | RESPIRATORY_TRACT | 0 refills | Status: DC

## 2016-03-13 MED ORDER — CLONAZEPAM 0.5 MG PO TABS: 0.5000 mg | ORAL_TABLET | Freq: Two times a day (BID) | ORAL | 5 refills | Status: DC | PRN

## 2016-03-13 NOTE — Progress Notes (Signed)
Subjective:    Patient ID: Marilyn [name], female    DOB: January 07, 1956, 61 y.o.   MRN: [id number]  HPI 61 y/o BF ex-smoker who quit 07/2015 and followed w/ hx "alveolar" sarcoidosis (exquisitely sens to Pred rx), and recurrent bronchitic infections... Marilyn [name]) remarried & Marilyn last name changed to Marilyn [name]... ~  SEE PREV EPIC NOTES FOR OLDER DATA >>     CXR 10/13 showed normal heart size, atherosclerotic calcif, clear lungs, surg clips within the left breast...  LABS 10/13:  FLP- on diet alone w/ TChol=214 LDL 119;  Chems- wnl;  CBC- ok w/ Hg=12.2 MCV=75 & rec to start on Fe;  TSH=1.65;  VitD=26  LABS 4/14:  FLP- ok on diet alone x LDL=115;  Chems- wnl;  CBC- ok w/ Hg=12.6 MCV=75;  TSH=1.40...   ~  June 23, 2013:  Yearly Marilyn [name] notes some cough, nasal congestion, drainage, clear mucus; she is still smoking ~5cig/d she says & asked to quit completely; we discussed Mucinex, Delsym, Tessalon... She's had a lot of side effects from Arimidex (stiffness, "I feel old", wt gain) and felt better off this med- but now she is back on it per Marilyn [name] et al...  We reviewed the following medical problems during today's office visit >>     Bronchitis, Sarcoid, Smoker> still smoking 1/4-1/2ppd, off inhalers, uses Mucinex prn; notes mild cough, sm amt clear phlegm, no CP, +Sob/Doe w/o change & asked to incr exercise, get wt down...    VI> trace edema; she knows to elim sodium, elev legs, wear support hose, not on diuretics as yet...    Chol> on diet alone; FLP 4/14 shows TChol 200, TG 100, HDL 65, LDL 115    Overweight> she increased from 160#=> 238# due to Zoloft & Arimidex she says; we reviewed diet, exercise, wt reduction strategies...    GI- GERD, Divertics, IBS, Polyp, +FamHx> she continues to do well & denies abd pain, n/v, d/c, blood seen, etc; last colon 2011 & f/u due 10yr Marilyn [name]...    Breast Cancer> stage 1 invasive ductal carcinoma, s/p lumpectomy & sentinel node bx (neg) 4/13, followed  by XRT 6-8/13, w/ Arimidex'1mg'$ /d starting 8/13 per Marilyn [name] & enduring the side effects...    DJD, VitD> off Tramadol, on OTC meds as needed; +Calcium/ VitD1000; she notes some left hand paresthesias- rec wrist splint trial...    Headaches> no recent problems, she uses OTC meds as needed...    Anxiety, Panic> on Alpraz0.'5mg'$  prn & WellbutrinXL150 (per Marilyn [name]; she is under a lot of stress...    Anemia> CBCs in epic shows Hg ~12 range... We reviewed prob list, meds, xrays and labs> see below for updates >>   CXR 4/15 showed norm heart size, clear lungs, no adenopathy, NAD...  PFT 4/15 showed FVC=3.49 (127%), FEV1=2.45 (112%), %1sec=70, mid-flows=61%predicted...  LABS 1/15-3/15: reviewed...   ~  September 27, 2015:  27 month ROV & pulmonary follow up visit>  Marilyn [name] & Marilyn [name]; Marilyn PCP is Marilyn [name]...    61y/o BF w/ a remote hx of "alveolar sarcoid" & she has been off Pred since 1992- see below;  She has been a mild cigarette smoker- generally <1/2ppd x yrs w/ ~30 pack-yr smoking hx; she tells me that she quit ~1 month ago!  She notes that was recently diagnosed w/ COPD based on a CXR from 06/2015 showing hyperinflation (otherw clear) per the radiologist;  However she had Spirometry 05/2013 showing superphysiologic numbers and  just some small airways dis w/ mid-flows at 61% predicted;  She has not prev required inhalers, but she was given a trial of Stiolto- 2sp daily from Marilyn [name] & she says she feels some better on this...    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    Marilyn [name] admit to quite a bit of stress over the yrs- Father is ill w/ hx colon cancer, prostate cancer, and bladder cancer; one sister passed at 641w/ hx breast cancer, lung cancer, and pancreatic cancer; one sister passed at 67from ?sarcoid, ?MS;  Marilyn [name] has a hx anxiety & panic disorder on prn Marilyn [name], also followed by Marilyn [name] at the cancer [name] for hx breast cancer- on Arimidex...     See problem list above...  EXAM shows Afeb, VSS, O2sat=99% on RA;  Wt=221#, 5'5"Tall, BMI=36-7;  HEENT- neg, mallampati1;  Chest- clear w/o w/r/r;  Heart- RR w/o m/r/g;  Abd- soft, nontender, neg;  Ext- neg w/o c/c/e;  Neuro- intact...  CXR 07/27/15>  Norm heart size, sl hyperinflation, clear lungs, left breast clips and scoliosis, NAD...  FullPFT 09/29/15>  FVC=3.04 (111%), FEV1=2.18 (102%), %1sec=72, mid-flows sl reduced at 70% predicted;  TLC=5.51 (105%), RV=2.60 (125%), RV/TLC=47%;  DLCO=58% predicted;  This is c/w min airflow obstruction mostly in the small airways, some mild air trapping & decr in DLCO...   LABS 06/2015 in Epic> reviewed-- CBC is ok w/ Hg=12.6;  Chems- wnl;  Thyroid- wnl... IMP/PLAN>>  Marilyn [name] has been a modest smoker & quit 137mogo; Marilyn CXR report indicates sl hyperinflation (& clear) but Marilyn PFT again does not reveal much airflow obstruction, lung volumes show some mild air trapping & curiously Marilyn DLCO is mod reduced;  She has a remote hx of Sarcoidosis but at that time she had a very prominent CXR pattern of "alveolar sarcoid" which responded nicely to Pred & a slow taper;  We discussed proceeding w/ Hi-res CT Chest for further eval vs trial of KLONOPIN as a combination relaxer for Marilyn symptoms of SOB w/ short term follow up to assertain it's effect on  Marilyn breathing;  She would like to try the latter & we will plan recheck in ~6wks...  ~  November 09, 2015:  6wk [place] that the Klonopin helped Marilyn breathing but 0.28m828mid was too strong- sleepy; she has been under stress w/ family health issues; she denies cough, sput, hemoptysis, CP, palpit, edema; Marilyn SOB is much improved but she has had a relapse w/ smoking "I've had a few" she says...     EXAM shows Afeb, VSS, O2sat=98% on RA;  Wt=221#, 5'5"Tall, BMI=36-7;  HEENT- neg, mallampati1;  Chest- clear w/o w/r/r;  Heart- RR w/o m/r/g;  Abd- soft, nontender, neg;  Ext- neg w/o c/c/e;  Neuro- intact... IMP/PLAN>>  Dyspnea> likely related to stress and "chest wall muscle spasm", improved on Klonopin0.'5mg'$ - pt asked to decr to 1/2 tab bid; she is on Stiolto per Marilyn [name]- 2sp daily & we discussed weaning... Given 2017 FLU vaccine today.    Hx Sarcoidosis in remission    Recurrent bronchitic infections    Cig smoker    PFTs w/ decr DLCO    Medical issues>  HL, GERD, Divertics/ IBS/ polyps & +FamHx colon Ca, Hx breast cancer on Arimidex, Hx anemia & Fe defic, Dysthymia on Wellbutrin & Klonopin...  ~  March 13, 2016:  21moROV & Marilyn [name] reports that Marilyn SOB is improved w/ the Klonopin- '5mg'$  1 tab bid (tol well),  but she notes a dry cough in the cold weather; she remains on Stiolto 1 puff daily and is still smoking ~4 cig/d;  She is deconditioned and still not exercising!  Prob list as above...   EXAM shows Afeb, VSS, O2sat=98% on RA;  Wt=224#, 5'5"Tall, BMI=37;  HEENT- neg, mallampati1;  Chest- clear w/o w/r/r;  Heart- RR w/o m/r/g;  Abd- soft, nontender, neg;  Ext- neg w/o c/c/e;  Neuro- intact..  IMP/PLAN>>  Marilyn [name] is Marilyn own worst enemy- needs to quit all smoking & start an exercise program; we reviewed the specifics of these recommendations & outlined the benefits etc, she... NOTE: >50% of this 12m rov was spent in counseling & coordination of care...             Problem  List:  PHYSICAL EXAMINATION (ICD-V70.0) - GYN= DrNeal who does Marilyn PAP smears, Mammograms (4/14- neg), and BMD... he also checked Marilyn VitD level and started VitD 50K weekly, then changed Marilyn to 2000 u daily... she is s/p hysterectomy in 2000... Note- she had an abd bruit investigated w/ an Abd MRA 6/08 and nothing found...  [facility] Routine Mammogram 3/13 showed left breast lesion leading to surg by DrHoxworth, XRT by [name], Oncology f/u w/ DrJanice [name].   Hx of BRONCHITIS (ICD-490) - no recent episodes... Min cough & clear sputum... ~  Baseline CXR is clear and WNL (no residuals from Marilyn sarcoidosis) ~  Spirometry 7/06 showed FVC= 3.77 (130%), FEV1= 2.71 (113%), %1sec=72, mid-flows=66%... ~  CXR 11/12 showed normal heart size, min scarring, otherw clear, NAD...Marland Kitchen ~  CXR 10/13 showed normal heart size, atherosclerotic calcif, clear lungs, surg clips within the left breast... ~  1/14: she presented w/ refractory cough/ AB- given Depo, Pred taper, & the usual OTC meds... ~  CXR 2/14 showed norm heart size, clear lungs, mild peribronch thickening, surg clips overlying left breast...  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 totally resolved on Pred therapy w/ normalization of CXR and no recurrence off Pred now since 1992... ~  CXR 9/10 remains clear & WNL...Marland Kitchen~  CXR 11/11 remains clear & WNL...Marland Kitchen~  CXR 11/12 remains clear, mild basilar atx, otherw WNL... ~  EKG 11/12 showed NSR, rate62, wnl, NAD...Marland Kitchen~  CXR 10/13 showed normal heart size, atherosclerotic calcif, clear lungs, surg clips within the left breast... ~  CXR 2/14 showed norm heart size, clear lungs, mild peribronch thickening, surg clips overlying left breast... ~  CXR 4/15 showed norm heart size, clear lungs, no adenopathy, NAD... ~  PFT 4/15 showed FVC=3.49 (127%), FEV1=2.45 (112%), %1sec=70,  mid-flows=61%predicted...  CIGARETTE SMOKER (ICD-305.1) - she was smoking 1/2 ppd> states  she quit 10/12 & now back to 1/2-1ppd!!! ~  4/15: she admits to still smoking 1/4ppd & encouraged to quit completely...  VENOUS INSUFFICIENCY (ICD-459.81) - she follows a low salt diet, elevates when nec, and wears support hose Prn.  HYPERLIPIDEMIA (ICD-272.4) - hx of hyperlipidemia w/ high HDL's prev... ~  [place] 7/07 showed TChol 210, TG 56, HDL 81, LDL 101 ~  FLP 6/08 showed TChol 232, TG 88, HDL 83, LDL 112 ~  FLP 8/09 showed TChol 199, TG 85, HDL 68, LDL 115 ~  FLP 9/10 (wt=175#) showed TChol 231, TG 78, HDL 94, LDL 120 ~  FLP 11/11 (wt=197#) showed TChol 265, TG 93, HDL 89, LDL 143... needs better diet get wt down. ~  FLP 11/12 (wt=210#) showed TChol 238, TG 79, HDL 83, LDL 148... Offered meds but she wants to see how she does on Marilyn wt loss program. ~  FLP 3/13 (wt=229#) showed TChol 235, TG 89, HDL 85, LDL 130 ~  FLP 10/13 (wt=229#) showed TChol 214, TG 114, HDL 60, LDL 119  ~  FLP 4/14 (wt=224#) showed TChol 200, TG 100, HDL 65, LDL 115  GERD (ICD-530.81) - uses OTC Prilosec as needed.  DIVERTICULOSIS OF COLON (ICD-562.10) IRRITABLE BOWEL SYNDROME (ICD-564.1) COLONIC POLYPS (ICD-211.3) Family Hx of COLON CANCER (ICD-153.9) -  ~  colonoscopy 9/06 by Demetra Shiner was WNL- no divertics, no polyps... f/u planned 54yr. ~  Father was diagnosed w/ colon cancer in 2009 & being treated by oncology... pt will need colon f/u in 2011. ~  f/u colonoscopy 9/11 by Marilyn [name] showed divertics & 2 polyps= polypoid mucosa, f/u planned 563yr  Hx of UTI (ICD-599.0)  BREAST CANCER >> Routine mammogram 3/13 showed a lesion on the left breast >> See above:  Surg- [name],  XRT- [name],  Oncology- Marilyn [name]... 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 ~  1/14: she had f/u [facility]> stage 1 invasive ductal carcinoma, s/p lumpectomy & sentinel node bx (neg) 4/13, followed by XRT 6-8/13, w/ Arimidex'1mg'$ /d starting 8/13; doing satis & no changes made... ~  2/14: she had ROV [name]> doing satis, no problems... ~  1/15: she had f/u Marilyn [name]> note reviewed, she was c/o some CTS & hip pain she believed was due to the Arimidex, they decided to HOLD it for 6-8wks & consider other med (eg-Femara/Tamoxifen), she felt better off the med but ultimately went back on it...  ~  Mammogram & Sonar 4/15 were neg- no evid of malignancy w/ post op changes noted...  DEGENERATIVE JOINT DISEASE (ICD-715.90) - she has mild DJD esp in knees & prev had cortisone shots from ortho- DrMcKinley... not on regular meds, she uses Tylenol Prn... ~  Oncology reported a BMD done by Marilyn GYN 4/13 w/ TScore -1.1 in left FemNeck... ~  4/15: follow up BMD has been ordered by Oncology, but not yet performed...  VITAMIN D DEFICIENCY (ICD-268.9) -  followed by Elmon Else & currently taking Vit D 1000 u caps- 2 daily. ~  Labs here 11/12 showed Vit D level = 37 ~  Labs 10/13 showed Vit D level = 26 ~  Labs 1/14 showed Vit D level = 44  HEADACHE (ICD-784.0)  Hx Panic Disorder> Improved w/o attacks in several yrs; she has ALPRAZOLAM0.'5mg'$  for prn use but seldom needs it she says... Hx of DEPRESSION (ICD-311) - husb passed away in June 03, 2003... she sees DrMcKinney Gaffer) on ZOLOFT '100mg'$ /d...  Hx of ANEMIA (ICD-285.9) - Hg chronically in the 11-12 range... ~  labs  9/10 showed Hg= 11.9, MCV= 77, Fe= 76 (21%sat) ~  labs 11/11 showed Hg= 12.0, MCV= 76, Fe= 80 (20%sat) ~  Labs 11/12 showed Hg= 12.1, MCV= 75, Fe=68 (19%sat) ~  Labs 4/13 showed Hg= 11.2 - 12.6 ~  Labs 10/13 showed Hg= 12.2, MCV= 75, Rec to start Fe daily... ~  Labs 4/14 showed Hg= 12.4, MCV= 75, & rec to restart thew FeSO4 daily...   Past Surgical History:  Procedure Laterality Date  . BIOPSY BREAST     left breast  as a teenager  benign  . BREAST SURGERY  07/05/11   left breast lumpectomy with needle loc & axillary sln bx  . BUNIONECTOMY  2009-06-02   bilateral by Dr Little Ishikawa  . COLONOSCOPY     polyp  . cyst removed from right hand  1995  . TONSILLECTOMY  1972  . TOTAL ABDOMINAL HYSTERECTOMY  06/03/1998   Dr Nori Riis  . TRIGGER FINGER RELEASE  06/03/06   Dr Burney Gauze    Outpatient Encounter Prescriptions as of 09/27/2015  Medication Sig Dispense Refill  . Marilyn [name] (XANAX) 0.5 MG tablet Take 1/2 to 1 tablet by mouth three times daily as needed for anxiety 90 tablet 2  . anastrozole (ARIMIDEX) 1 MG tablet TAKE 1 TABLET (1 MG TOTAL) BY MOUTH DAILY. 90 tablet 1  . buPROPion (WELLBUTRIN XL) 150 MG 24 hr tablet TAKE 1 TABLET (150 MG TOTAL) BY MOUTH DAILY. 90 tablet 2  . Cholecalciferol 5000 UNITS TABS Take 1 tablet by mouth 3 (three) times a week.    . clobetasol cream (TEMOVATE) 7.61 % Apply 1 application topically 2 (two) times daily. 60 g 3  . Multiple Vitamin (MULTIVITAMIN WITH MINERALS) TABS tablet Take 1 tablet by mouth daily.    . Tiotropium Bromide-Olodaterol (STIOLTO RESPIMAT) 2.5-2.5 MCG/ACT AERS Inhale 2 puffs into the lungs daily. 1 Inhaler 11  . busPIRone (BUSPAR) 10 MG tablet Take 10 mg by mouth 3 (three) times daily.  2  . [DISCONTINUED] ibuprofen (ADVIL,MOTRIN) 600 MG tablet Take 1 tablet (600 mg total) by mouth 3 (three) times daily. (Patient not taking: Reported on 09/27/2015) 90 tablet 0   No facility-administered encounter medications on file as of 09/27/2015.     Allergies  Allergen  Reactions  . Penicillins Rash and Other (See Comments)    At injection site.    Current Medications, Allergies, Past Medical History, Past Surgical History, Family History, and Social History were reviewed in [employer] record.    Review of Systems    The patient notes some DOE- improved from last yr.  She denies fever, chills, sweats, anorexia, fatigue, weakness, malaise, weight loss, sleep disorder, blurring, diplopia, eye irritation, eye discharge, vision loss, eye pain, photophobia, earache, ear discharge, tinnitus, decreased hearing, nasal congestion, nosebleeds, sore throat, hoarseness, chest pain, palpitations, syncope, orthopnea, PND, peripheral edema, cough, dyspnea at rest, excessive sputum, hemoptysis, wheezing,  pleurisy, nausea, vomiting, diarrhea, constipation, change in bowel habits, abdominal pain, melena, hematochezia, jaundice, gas/bloating, indigestion/heartburn, dysphagia, odynophagia, dysuria, hematuria, urinary frequency, urinary hesitancy, nocturia, incontinence, back pain, joint pain, joint swelling, muscle cramps, muscle weakness, stiffness, arthritis, sciatica, restless legs, leg pain at night, leg pain with exertion, rash, itching, dryness, suspicious lesions, paralysis, paresthesias, seizures, tremors, vertigo, transient blindness, frequent falls, frequent headaches, difficulty walking, depression, anxiety, memory loss, confusion, cold intolerance, heat intolerance, polydipsia, polyphagia, polyuria, unusual weight change, abnormal bruising, bleeding, enlarged lymph nodes, urticaria, allergic rash, hay fever, and recurrent infections.     Objective:   Physical Exam     WD, overweight, 61 y/o BF in NAD... GENERAL:  Alert & oriented; pleasant & cooperative... HEENT:  [place]/AT, EOM-wnl, PERRLA, EACs-clear, TMs-wnl, NOSE-clear, THROAT-clear & wnl. NECK:  Supple w/ full ROM; no JVD; normal carotid impulses w/o bruits; no thyromegaly or nodules palpated; no  lymphadenopathy. CHEST:  Clear to P & A; w/o wheezing, rales, or rhonchi heard... HEART:  Regular Rhythm; without murmurs/ rubs/ or gallops. ABDOMEN:  Soft & nontender; normal bowel sounds; no organomegaly or masses detected... EXT: without deformities, mild arthritic changes; no varicose veins/ venous insuffic/ or edema. NEURO:  CN's intact; motor testing normal; sensory testing normal; gait normal & balance OK. DERM:  No lesions noted; no rash etc...  RADIOLOGY DATA:  Reviewed in the EPIC EMR & discussed w/ the patient...  LABORATORY DATA:  Reviewed in the EPIC EMR & discussed w/ the patient...   Assessment & Plan:    Smoker, Hx Bronchits, Hx Sarcoid>  Hx of recurrent AB w/ refractory cough, congestion; she's had ZPak, Depo, Pred taper, Mucinex, Fluids & resolved; Sarcoid in remission, she quit smoking 07/2015... 09/27/15>   Marilyn [name] has been a modest smoker & quit 34moago; Marilyn CXR report indicates sl hyperinflation (& clear) but Marilyn PFT again does not reveal much airflow obstruction, lung volumes show some mild air trapping & curiously Marilyn DLCO is mod reduced;  She has a remote hx of Sarcoidosis but at that time she had a very prominent CXR pattern of "alveolar sarcoid" which responded nicely to Pred & a slow taper;  We discussed proceeding w/ Hi-res CT Chest for further eval vs trial of KLONOPIN as a combination relaxer for Marilyn symptoms of SOB w/ short term follow up to assertain it's effect on Marilyn breathing;  She would like to try the latter & we will plan recheck in ~6wks. 11/09/15>   She is improved on the Klonopin but 0.'5mg'$  is too strong- advised to decr to 1/2 tab bid, NO SMOKING, & ROV 428mo.  03/14/15>   She is now tolerating the Klonopin 0.'5mg'$  bid, must quit all smoking 7 incr Marilyn exercise program- discussed in detail...   Ven Insuffic>  Aware- avoid sodium, elevate, support hose...  Hyperlipid>  She doesn't want meds but weight is up substantially & needs to get wt down...  GI> GERD,  Divertics, IBS, Polyps>  Stable on Rx, followed by Marilyn [name] & up to date...  BREAST CANCER> followed by DrJanice Norrien Arimidex...  DJD>  She uses OTC analgesics as needed; notes incr symptoms w/ wt gain & hoping for improvement on diet plan...  Hx Depression>  She was followed by psychiatrist prev on Zoloft & on prn Alpraz which she seldom takes + Wellbutrin started by DrIngram Micro Inc.  Hx Anemia>  Hg stable ~12 w/ sm cells & Fe levels wnl; poss Thalassemia minor, it still wouldn't hurt to take Fe daily supplement...Marland KitchenMarland Kitchen  Patient's Medications  New Prescriptions   No medications on file  Previous Medications   Marilyn [name] (XANAX) 0.5 MG TABLET    Take 1/2 to 1 tablet by mouth three times daily as needed for anxiety   ANASTROZOLE (ARIMIDEX) 1 MG TABLET    Take 1 tablet (1 mg total) by mouth daily.   BUPROPION (WELLBUTRIN XL) 150 MG 24 HR TABLET    TAKE 1 TABLET (150 MG TOTAL) BY MOUTH DAILY.   CHOLECALCIFEROL 5000 UNITS TABS    Take 1 tablet by mouth 3 (three) times a week.   CLOBETASOL CREAM (TEMOVATE) 0.05 %    Apply 1 application topically 2 (two) times daily.   ESTRADIOL (ESTRACE) 0.1 MG/GM VAGINAL CREAM    Use 1-2 times per week   MULTIPLE VITAMIN (MULTIVITAMIN WITH MINERALS) TABS TABLET    Take 1 tablet by mouth daily.   TIOTROPIUM BROMIDE-OLODATEROL (STIOLTO RESPIMAT) 2.5-2.5 MCG/ACT AERS    Inhale 2 puffs into the lungs daily.  Modified Medications   Modified Medication Previous Medication   CLONAZEPAM (KLONOPIN) 0.5 MG TABLET clonazePAM (KLONOPIN) 0.5 MG tablet      Take 1 tablet (0.5 mg total) by mouth 2 (two) times daily as needed for anxiety.    Take 1 tablet (0.5 mg total) by mouth 2 (two) times daily as needed for anxiety.  Discontinued Medications   BUSPIRONE (BUSPAR) 10 MG TABLET    Take 10 mg by mouth 3 (three) times daily.

## 2016-03-13 NOTE — Patient Instructions (Signed)
Today we updated your med list in our EPIC system...    Continue your current medications the same...  We discussed continuing the Stiolto and Klonopin therapy...  Work on smoking cessation, weight reduction, and increasing your exercise program...  For the cough >> try throat lozenges, cough drops, [place] caramels, etc...  Try the OTC cough syrup >> DELSYM 2 tsp twice daily as needed...  Call for any questions...  Let's plan a follow up visit in 75mo, sooner if needed for breathing problems.Marland KitchenMarland Kitchen

## 2016-04-05 ENCOUNTER — Telehealth: Payer: Self-pay | Admitting: Pulmonary Disease

## 2016-04-05 ENCOUNTER — Other Ambulatory Visit: Payer: Self-pay | Admitting: Pulmonary Disease

## 2016-04-05 MED ORDER — CLONAZEPAM 0.5 MG PO TABS: 0.5000 mg | ORAL_TABLET | Freq: Two times a day (BID) | ORAL | 5 refills | Status: DC | PRN

## 2016-04-05 NOTE — Telephone Encounter (Signed)
Spoke with pt. States that she is needing a refill on Clonazepam. This prescription was called into the pharmacy on 03/13/16. I called [facility] to make sure that they did have this on file and was told that they didn't have a pt under this name or DOB. They have the pt listed under Marilyn [name]. This is also the name on her insurance card. Her name was changed at her last OV but one of our receptionist. I have spoke with our up front Team Lead and this will be addressed. I gave the verbal order to [facility] to fill this prescription. I called the pt and explained to her what happened and she was understanding. Pt's name will be changed back to Marilyn [name] in our system.

## 2016-05-05 ENCOUNTER — Other Ambulatory Visit: Payer: Self-pay | Admitting: Internal Medicine

## 2016-05-05 DIAGNOSIS — Z853 Personal history of malignant neoplasm of breast: Secondary | ICD-10-CM

## 2016-05-08 ENCOUNTER — Ambulatory Visit (INDEPENDENT_AMBULATORY_CARE_PROVIDER_SITE_OTHER)
Admission: RE | Admit: 2016-05-08 | Discharge: 2016-05-08 | Disposition: A | Discharge status: Home / Self Care | Payer: No Typology Code available for payment source | Source: Ambulatory Visit | Attending: Pulmonary Disease | Admitting: Pulmonary Disease

## 2016-05-08 ENCOUNTER — Other Ambulatory Visit: Payer: No Typology Code available for payment source

## 2016-05-08 ENCOUNTER — Ambulatory Visit (INDEPENDENT_AMBULATORY_CARE_PROVIDER_SITE_OTHER): Payer: No Typology Code available for payment source | Admitting: Pulmonary Disease

## 2016-05-08 VITALS — BP 138/76 | HR 88 | Temp 96.8°F | Ht 65.0 in | Wt 222.5 lb

## 2016-05-08 DIAGNOSIS — R0609 Other forms of dyspnea: Secondary | ICD-10-CM | POA: Diagnosis not present

## 2016-05-08 DIAGNOSIS — Z87891 Personal history of nicotine dependence: Secondary | ICD-10-CM

## 2016-05-08 DIAGNOSIS — Z6837 Body mass index (BMI) 37.0-37.9, adult: Secondary | ICD-10-CM

## 2016-05-08 DIAGNOSIS — D869 Sarcoidosis, unspecified: Secondary | ICD-10-CM | POA: Diagnosis not present

## 2016-05-08 DIAGNOSIS — F419 Anxiety disorder, unspecified: Secondary | ICD-10-CM

## 2016-05-08 DIAGNOSIS — E6609 Other obesity due to excess calories: Secondary | ICD-10-CM

## 2016-05-08 DIAGNOSIS — F17200 Nicotine dependence, unspecified, uncomplicated: Secondary | ICD-10-CM | POA: Insufficient documentation

## 2016-05-08 LAB — ANGIOTENSIN CONVERTING ENZYME: ANGIOTENSIN-CONVERTING ENZYME: 29 U/L (ref 9–67)

## 2016-05-08 MED ORDER — BUDESONIDE-FORMOTEROL FUMARATE 80-4.5 MCG/ACT IN AERO: 2.0000 | INHALATION_SPRAY | Freq: Two times a day (BID) | RESPIRATORY_TRACT | 11 refills | Status: DC

## 2016-05-08 NOTE — Patient Instructions (Signed)
Today we updated your med list in our EPIC system...  Remember to decr the Klonopin 0.5mg  to 1/2 tab twice daily...     We decided to change your STIOLTO to SYMBICORT-80=> 2 inhalations twice daily, then rinse your mouth     Today we did your follow up CXR & Sarcoid blood test...    We will contact you w/ the results when available...   I concur w/ your plan for [employer], weight reduction, and a regular exercise program...    Stay as active as possible!  Call for any questions...  Let's plan a follow up visit in 66mo, sooner if needed for problems.Marland KitchenMarland Kitchen

## 2016-05-09 ENCOUNTER — Encounter: Payer: Self-pay | Admitting: Pulmonary Disease

## 2016-05-09 NOTE — Progress Notes (Signed)
Subjective:    Patient ID: Marilyn [name] [name], female    DOB: [birth year], 61 y.o.   MRN: [id number]  HPI 61 y/o BF ex-smoker who quit 07/2015 and followed w/ hx "alveolar" sarcoidosis (exquisitely sens to Pred rx), and recurrent bronchitic infections... Marilyn [name] [name]) remarried & her last name changed to [facility]... ~  SEE PREV EPIC NOTES FOR OLDER DATA >>     CXR 10/13 showed normal heart size, atherosclerotic calcif, clear lungs, surg clips within the left breast...  LABS 10/13:  FLP- on diet alone w/ TChol=214 LDL 119;  Chems- wnl;  CBC- ok w/ Hg=12.2 MCV=75 & rec to start on Fe;  TSH=1.65;  VitD=26  LABS 4/14:  FLP- ok on diet alone x LDL=115;  Chems- wnl;  CBC- ok w/ Hg=12.6 MCV=75;  TSH=1.40...   ~  June 23, 2013:  Yearly Goodrich notes some cough, nasal congestion, drainage, clear mucus; she is still smoking ~5cig/d she says & asked to quit completely; we discussed Mucinex, Delsym, Tessalon... She's had a lot of side effects from Arimidex (stiffness, "I feel old", wt gain) and felt better off this med- but now she is back on it per Marilyn [name] [name] et al...  We reviewed the following medical problems during today's office visit >>     Bronchitis, Sarcoid, Smoker> still smoking 1/4-1/2ppd, off inhalers, uses Mucinex prn; notes mild cough, sm amt clear phlegm, no CP, +Sob/Doe w/o change & asked to incr exercise, get wt down...    VI> trace edema; she knows to elim sodium, elev legs, wear support hose, not on diuretics as yet...    Chol> on diet alone; FLP 4/14 shows TChol 200, TG 100, HDL 65, LDL 115    Overweight> she increased from 160#=> 238# due to Zoloft & Arimidex she says; we reviewed diet, exercise, wt reduction strategies...    GI- GERD, Divertics, IBS, Polyp, +FamHx> she continues to do well & denies abd pain, n/v, d/c, blood seen, etc; last colon 2011 & f/u due 59yr Marilyn [name] [name]...    Breast Cancer> stage 1 invasive ductal carcinoma, s/p lumpectomy & sentinel node bx (neg) 4/13, followed  by XRT 6-8/13, w/ Arimidex157md starting 8/13 per Marilyn [name] & enduring the side effects...    DJD, VitD> off Tramadol, on OTC meds as needed; +Calcium/ VitD1000; she notes some left hand paresthesias- rec wrist splint trial...    Headaches> no recent problems, she uses OTC meds as needed...    Anxiety, Panic> on Alpraz0.25m12mrn & WellbutrinXL150 ([name] a lot of stress...    Anemia> CBCs in epic shows Hg ~12 range... We reviewed prob list, meds, xrays and labs> see below for updates >>   CXR 4/15 showed norm heart size, clear lungs, no adenopathy, NAD...  PFT 4/15 showed FVC=3.49 (127%), FEV1=2.45 (112%), %1sec=70, mid-flows=61%predicted...  LABS 1/15-3/15: reviewed...   ~  September 27, 2015:  27 month ROV & pulmonary follow up visit>  Marilyn [name] [name] 04/2015 & her new last name is [name]; her PCP is Marilyn [name] [name]...    [age]o BF w/ a remote hx of "alveolar sarcoid" & she has been off Pred since 1992- see below;  She has been a mild cigarette smoker- generally <1/2ppd x yrs w/ ~30 pack-yr smoking hx; she tells me that she quit ~1 month ago!  She notes that was recently diagnosed w/ COPD based on a CXR from 06/2015 showing hyperinflation (otherw clear) per the radiologist;  However she had Spirometry 05/2013 showing superphysiologic numbers and  just some small airways dis w/ mid-flows at 61% predicted;  She has not prev required inhalers, but she was given a trial of Stiolto- 2sp daily from Marilyn [name] [name] & she says she feels some better on this...    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    MRenessaalso admit to quite a bit of stress over the yrs- Father is ill w/ hx colon cancer, prostate cancer, and bladder cancer; one sister passed at 641w/ hx breast cancer, lung cancer, and pancreatic cancer; one sister passed at 67from ?sarcoid, ?MS;  Marilyn [name] has a hx anxiety & panic disorder on prn Alprazolam, also followed by Marilyn [name] [name] at the cancer center for hx breast cancer- on Arimidex...     See problem list above...  EXAM shows Afeb, VSS, O2sat=99% on RA;  Wt=221#, 5'5"Tall, BMI=36-7;  HEENT- neg, mallampati1;  Chest- clear w/o w/r/r;  Heart- RR w/o m/r/g;  Abd- soft, nontender, neg;  Ext- neg w/o c/c/e;  Neuro- intact...  CXR 07/27/15>  Norm heart size, sl hyperinflation, clear lungs, left breast clips and scoliosis, NAD...  FullPFT 09/29/15>  FVC=3.04 (111%), FEV1=2.18 (102%), %1sec=72, mid-flows sl reduced at 70% predicted;  TLC=5.51 (105%), RV=2.60 (125%), RV/TLC=47%;  DLCO=58% predicted;  This is c/w min airflow obstruction mostly in the small airways, some mild air trapping & decr in DLCO...   LABS 06/2015 in Epic> reviewed-- CBC is ok w/ Hg=12.6;  Chems- wnl;  Thyroid- wnl... IMP/PLAN>>  Marilyn [name] has been a modest smoker & quit 137mogo; her CXR report indicates sl hyperinflation (& clear) but her PFT again does not reveal much airflow obstruction, lung volumes show some mild air trapping & curiously her DLCO is mod reduced;  She has a remote hx of Sarcoidosis but at that time she had a very prominent CXR pattern of "alveolar sarcoid" which responded nicely to Pred & a slow taper;  We discussed proceeding w/ Hi-res CT Chest for further eval vs trial of KLONOPIN as a combination relaxer for her symptoms of SOB w/ short term follow up to assertain it's effect on  her breathing;  She would like to try the latter & we will plan recheck in ~6wks...  ~  November 09, 2015:  6wk ROPretty Bayoueports that the Klonopin helped her breathing but 0.28m828mid was too strong- sleepy; she has been under stress w/ family health issues; she denies cough, sput, hemoptysis, CP, palpit, edema; her SOB is much improved but she has had a relapse w/ smoking "I've had a few" she says...     EXAM shows Afeb, VSS, O2sat=98% on RA;  Wt=221#, 5'5"Tall, BMI=36-7;  HEENT- neg, mallampati1;  Chest- clear w/o w/r/r;  Heart- RR w/o m/r/g;  Abd- soft, nontender, neg;  Ext- neg w/o c/c/e;  Neuro- intact... IMP/PLAN>>  Dyspnea> likely related to stress and "chest wall muscle spasm", improved on Klonopin0.30m- pt asked to decr to 1/2 tab bid; she is on Stiolto per Marilyn [name] [name]- 2sp daily & we discussed weaning... Given 2017 FLU vaccine today.    Hx Sarcoidosis in remission    Recurrent bronchitic infections    Cig smoker    PFTs w/ decr DLCO    Medical issues>  HL, GERD, Divertics/ IBS/ polyps & +FamHx colon Ca, Hx breast cancer on Arimidex, Hx anemia & Fe defic, Dysthymia on Wellbutrin & Klonopin...  ~  March 13, 2016:  480moOV & Marilyn [name] reports that her SOB is improved w/ the Klonopin- 58m68m tab bid (tol well),  but she notes a dry cough in the cold weather; she remains on Stiolto 1 puff daily and is still smoking ~4 cig/d;  She is deconditioned and still not exercising!  Prob list as above...   EXAM shows Afeb, VSS, O2sat=98% on RA;  Wt=224#, 5'5"Tall, BMI=37;  HEENT- neg, mallampati1;  Chest- clear w/o w/r/r;  Heart- RR w/o m/r/g;  Abd- soft, nontender, neg;  Ext- neg w/o c/c/e;  Neuro- intact..  IMP/PLAN>>  Marilyn [name] is her own worst enemy- needs to quit all smoking & start an exercise program; we reviewed the specifics of these recommendations & outlined the benefits etc, she...     NOTE: >50% of this 158m38mov was spent in counseling & coordination of care...    ~  May 08, 2016:  103mo 4103mo & pulmonary/ medical follow up visit>  Marilyn [name] indicates to me that she has quit smoking & her last cig was 3 wks ago! She is hoping to remain quit but husb smokes; she notes some wheezing on & off esp at night & some SOB esp w/ exercise & hills; it appears that her Stiolto is not holding her & we discussed change to Symbicort160-2spBid; she is still taking the Klonopin 0.58mg b52mbut notes too groggy & again reminded to decr to 1/2 tab Bid; she's been exercising by walking & has a home gym but needs to incr the intensity of exercise, & consider weight watchers; we reviewed the following medical problems during today's office visit >>     Dyspnea> likely related to stress and "chest wall muscle spasm", improved on Klonopin0.58mg- p76msked to decr to 1/2 tab bid.    Hx Sarcoidosis in remission    Recurrent bronchitic infections    Ex-Cig smoker    PFTs w/ decr DLCO    Medical issues>  HL, GERD, Divertics/ IBS/ polyps & +FamHx colon Ca, Hx breast cancer on Arimidex, Hx anemia & Fe defic, Dysthymia on Wellbutrin & Klonopin... EXAM shows Afeb, VSS, O2sat=97% on RA;  Wt=223#, 5'5"Tall, BMI=37;  HEENT- neg, mallampati1;  Chest- clear w/o w/r/r;  Heart- RR w/o m/r/g;  Abd- soft, nontender, neg;  Ext- VI, tr edema no c/c;  Neuro- intact..   CXR 05/08/16 (independently reviewed by me in the PACS system)> norm heart size, atherosclerotic ao, mild hyperinflation & peribronchial thickening, otherw clear- NAD; clips in left breast area & osteopenia...   LABS 05/08/16>  ACE level = 29 IMP/PLAN>>  Marilyn [name] quit smoking 3 wks ago & hopefully she can maintain (husb still smokes); she's noted some wheezing esp Qhs & the Stiolto is not holding her- we will switch to SYMBICORT160-2spBid;  She also c/o DOE w/ exercise & hills but she is overwt, hasn't really been on diet/ exercise program as we outlined for her & she  understands that further improvement requires a lot of hard work on her part!  She was concerned that her symptoms  were related to reactivation of her Sarcoid- but CXR & ACE remain normal & she is reassured;  Finally she notes that the Klonopin works well for her SOB/ anxiety but 0.5mg Bid makes her too groggy & rec to decr to 1/2 tab Bid as discussed... we plan rov in 3mo.            Problem List:  PHYSICAL EXAMINATION (ICD-V70.0) - GYN= DrNeal who does her PAP smears, Mammograms (4/14- neg), and BMD... he also checked her VitD level and started VitD 50K weekly, then changed her to 2000 u daily... she is s/p hysterectomy in 2000... Note- she had an abd bruit investigated w/ an Abd MRA 6/08 and nothing found...  had TETANUS shot 2010 at [facility] after she stepped on nail... Routine Mammogram 3/13 showed left breast lesion leading to surg by DrHoxworth, XRT by [name], Oncology f/u w/ Marilyn [name]...   Hx of BRONCHITIS (ICD-490) - no recent episodes... Min cough & clear sputum... ~  Baseline CXR is clear and WNL (no residuals from her sarcoidosis) ~  Spirometry 7/06 showed FVC= 3.77 (130%), FEV1= 2.71 (113%), %1sec=72, mid-flows=66%... ~  CXR 11/12 showed normal heart size, min scarring, otherw clear, NAD...  ~  CXR 10/13 showed normal heart size, atherosclerotic calcif, clear lungs, surg clips within the left breast... ~  1/14: she presented w/ refractory cough/ AB- given Depo, Pred taper, & the usual OTC meds... ~  CXR 2/14 showed norm heart size, clear lungs, mild peribronch thickening, surg clips overlying left breast...  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 totally resolved on Pred therapy w/ normalization of CXR and no recurrence off Pred now since 1992... ~  CXR 9/10 remains clear & WNL... ~  CXR 11/11 remains clear & WNL... ~  CXR 11/12 remains clear, mild basilar atx, otherw WNL... ~  EKG 11/12 showed NSR, rate62, wnl, NAD... ~  CXR 10/13 showed normal heart size, atherosclerotic calcif, clear lungs, surg clips within the  left breast... ~  CXR 2/14 showed norm heart size, clear lungs, mild peribronch thickening, surg clips overlying left breast... ~  CXR 4/15 showed norm heart size, clear lungs, no adenopathy, NAD... ~  PFT 4/15 showed FVC=3.49 (127%), FEV1=2.45 (112%), %1sec=70, mid-flows=61%predicted...  CIGARETTE SMOKER (ICD-305.1) - she was smoking 1/2 ppd> states she quit 10/12 & now back to 1/2-1ppd!!! ~  4/15: she admits to still smoking 1/4ppd & encouraged to quit completely...  VENOUS INSUFFICIENCY (ICD-459.81) - she follows a low salt diet, elevates when nec, and wears support hose Prn.  HYPERLIPIDEMIA (ICD-272.4) - hx of hyperlipidemia w/ high HDL's prev... ~  FLP 7/07 showed TChol 210, TG 56, HDL 81, LDL 101 ~  FLP 6/08 showed TChol 232, TG 88, HDL 83, LDL 112 ~  FLP 8/09 showed TChol 199, TG 85, HDL 68, LDL 115 ~  FLP 9/10 (wt=175#) showed TChol 231, TG 78, HDL 94, LDL 120 ~  FLP 11/11 (wt=197#) showed TChol 265, TG 93, HDL 89, LDL 143... needs better diet get wt down. ~  FLP 11/12 (wt=210#) showed TChol 238, TG 79, HDL 83, LDL 148... Offered meds but she wants to see how she does on her wt loss program. ~  FLP 3/13 (wt=229#) showed TChol 235, TG 89, HDL 85, LDL 130 ~  FLP   10/13 (wt=229#) showed TChol 214, TG 114, HDL 60, LDL 119  ~  FLP 4/14 (wt=224#) showed TChol 200, TG 100, HDL 65, LDL 115  GERD (ICD-530.81) - uses OTC Prilosec as needed.  DIVERTICULOSIS OF COLON (ICD-562.10) IRRITABLE BOWEL SYNDROME (ICD-564.1) COLONIC POLYPS (ICD-211.3) Family Hx of COLON CANCER (ICD-153.9) -  ~  colonoscopy 9/06 by Marilyn [name] [name] was WNL- no divertics, no polyps... f/u planned 10yrs. ~  Father was diagnosed w/ colon cancer in 2009 & being treated by oncology... pt will need colon f/u in 2011. ~  f/u colonoscopy 9/11 by Marilyn [name] [name] showed divertics & 2 polyps= polypoid mucosa, f/u planned 5yrs.  Hx of UTI (ICD-599.0)  BREAST CANCER >> Routine mammogram 3/13 showed a lesion on the left breast >> See above:   Surg- [name],  XRT- [name],  Oncology- Marilyn [name]... 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 ~  1/14: she had f/u [facility]> stage 1 invasive ductal carcinoma, s/p lumpectomy & sentinel node bx (neg) 4/13, followed by XRT 6-8/13, w/ Arimidex1mg/d starting 8/13; doing satis & no changes made... ~  2/14: she had ROV [name]> doing satis, no problems... ~  1/15: she had f/u Marilyn [name]> note reviewed, she was c/o some CTS & hip pain she believed was due to the Arimidex, they decided to HOLD it for 6-8wks & consider other med (eg-Femara/Tamoxifen), she felt better off the med but ultimately went back on it...  ~  Mammogram & Sonar 4/15 were neg- no evid of malignancy w/ post op changes noted...  DEGENERATIVE JOINT DISEASE (ICD-715.90) - she has mild DJD esp in knees & prev had cortisone shots from ortho- DrMcKinley... not on regular meds, she uses Tylenol Prn... ~  Oncology reported a BMD done by her GYN 4/13 w/ TScore -1.1 in left FemNeck... ~  4/15: follow up BMD has been ordered by Oncology, but not yet performed...  VITAMIN D DEFICIENCY (ICD-268.9) - followed by DrNeal & currently taking Vit D 1000 u caps- 2 daily. ~  Labs here 11/12 showed Vit D level = 37 ~  Labs 10/13 showed Vit D level = 26 ~  Labs 1/14 showed Vit D level = 44  HEADACHE (ICD-784.0)  Hx Panic Disorder> Improved w/o attacks in  several yrs; she has ALPRAZOLAM0.5mg for prn use but seldom needs it she says... Hx of DEPRESSION (ICD-311) - husb passed away in 2005... she sees DrMcKinney (Meredith Baker) on ZOLOFT 100mg/d...  Hx of ANEMIA (ICD-285.9) - Hg chronically in the 11-12 range... ~  labs 9/10 showed Hg= 11.9, MCV= 77, Fe= 76 (21%sat) ~  labs 11/11 showed Hg= 12.0, MCV= 76, Fe= 80 (20%sat) ~  Labs 11/12 showed Hg= 12.1, MCV= 75, Fe=68 (19%sat) ~  Labs 4/13 showed Hg= 11.2 - 12.6 ~  Labs 10/13 showed Hg= 12.2, MCV= 75, Rec to start Fe daily... ~  Labs 4/14 showed Hg= 12.4, MCV= 75, & rec to   restart thew FeSO4 daily...   Past Surgical History:  Procedure Laterality Date  . BIOPSY BREAST     left breast  as a teenager  benign  . BREAST SURGERY  07/05/11   left breast lumpectomy with needle loc & axillary sln bx  . BUNIONECTOMY  2011   bilateral by Dr Tuckman  . COLONOSCOPY     polyp  . cyst removed from right hand  1995  . TONSILLECTOMY  1972  . TOTAL ABDOMINAL HYSTERECTOMY  2000   Dr [name]  . TRIGGER FINGER RELEASE  2008   Dr Weingold    Outpatient Encounter Prescriptions as of 05/08/2016  Medication Sig  . . anastrozole (ARIMIDEX) 1 MG tablet Take 1 tablet (1 mg total) by mouth daily.  . buPROPion (WELLBUTRIN XL) 150 MG 24 hr tablet TAKE 1 TABLET (150 MG TOTAL) BY MOUTH DAILY.  . Cholecalciferol 5000 UNITS TABS Take 1 tablet by mouth 3 (three) times a week.  . clobetasol cream (TEMOVATE) 0.05 % Apply 1 application topically 2 (two) times daily.  . clonazePAM (KLONOPIN) 0.5 MG tablet Take 1 tablet (0.5 mg total) by mouth 2 (two) times daily as needed for anxiety.  . estradiol (ESTRACE) 0.1 MG/GM vaginal cream Use 1-2 times per week  . Multiple Vitamin (MULTIVITAMIN WITH MINERALS) TABS tablet Take 1 tablet by mouth daily.  . Tiotropium Bromide-Olodaterol (STIOLTO RESPIMAT) 2.5-2.5 MCG/ACT AERS Inhale 2 puffs into the lungs daily.   No facility-administered encounter medications on file as of 05/08/2016.      Allergies  Allergen Reactions  . Penicillins Rash and Other (See Comments)    At injection site.    Current Medications, Allergies, Past Medical History, Past Surgical History, Family History, and Social History were reviewed in [facility] electronic medical record.    Review of Systems    The patient notes some DOE- improved from last yr.  She denies fever, chills, sweats, anorexia, fatigue, weakness, malaise, weight loss, sleep disorder, blurring, diplopia, eye irritation, eye discharge, vision loss, eye pain, photophobia, earache, ear discharge, tinnitus, decreased hearing, nasal congestion, nosebleeds, sore throat, hoarseness, chest pain, palpitations, syncope, orthopnea, PND, peripheral edema, cough, dyspnea at rest, excessive sputum, hemoptysis, wheezing, pleurisy, nausea, vomiting, diarrhea, constipation, change in bowel habits, abdominal pain, melena, hematochezia, jaundice, gas/bloating, indigestion/heartburn, dysphagia, odynophagia, dysuria, hematuria, urinary frequency, urinary hesitancy, nocturia, incontinence, back pain, joint pain, joint swelling, muscle cramps, muscle weakness, stiffness, arthritis, sciatica, restless legs, leg pain at night, leg pain with exertion, rash, itching, dryness, suspicious lesions, paralysis, paresthesias, seizures, tremors, vertigo, transient blindness, frequent falls, frequent headaches, difficulty walking, depression, anxiety, memory loss, confusion, cold intolerance, heat intolerance, polydipsia, polyphagia, polyuria, unusual weight change, abnormal bruising, bleeding, enlarged lymph nodes, urticaria, allergic rash, hay fever, and recurrent infections.     Objective:   Physical Exam     WD, overweight, 61 y/o BF in NAD... GENERAL:  Alert & oriented; pleasant & cooperative... HEENT:  [place]/AT, EOM-wnl, PERRLA, EACs-clear, TMs-wnl, NOSE-clear, THROAT-clear & wnl. NECK:  Supple w/ full ROM; no JVD; normal carotid impulses w/o bruits; no  thyromegaly or nodules palpated; no lymphadenopathy. CHEST:  Clear to P & A; w/o wheezing, rales, or rhonchi heard... HEART:  Regular Rhythm; without murmurs/ rubs/ or gallops. ABDOMEN:  Soft & nontender; normal bowel sounds; no organomegaly or masses detected... EXT: without deformities, mild arthritic changes; no varicose veins/ venous insuffic/ or edema. NEURO:  CN's intact; motor testing normal; sensory testing normal; gait normal & balance OK. DERM:  No   lesions noted; no rash etc...  RADIOLOGY DATA:  Reviewed in the EPIC EMR & discussed w/ the patient...  LABORATORY DATA:  Reviewed in the EPIC EMR & discussed w/ the patient...   Assessment & Plan:    Smoker, Hx Bronchits, Hx Sarcoid>  Hx of recurrent AB w/ refractory cough, congestion; she's had ZPak, Depo, Pred taper, Mucinex, Fluids & resolved; Sarcoid in remission, she quit smoking 07/2015... 09/27/15>   Marilyn [name] has been a modest smoker & quit 1mo ago; her CXR report indicates sl hyperinflation (& clear) but her PFT again does not reveal much airflow obstruction, lung volumes show some mild air trapping & curiously her DLCO is mod reduced;  She has a remote hx of Sarcoidosis but at that time she had a very prominent CXR pattern of "alveolar sarcoid" which responded nicely to Pred & a slow taper;  We discussed proceeding w/ Hi-res CT Chest for further eval vs trial of KLONOPIN as a combination relaxer for her symptoms of SOB w/ short term follow up to assertain it's effect on her breathing;  She would like to try the latter & we will plan recheck in ~6wks. 11/09/15>   She is improved on the Klonopin but 0.5mg is too strong- advised to decr to 1/2 tab bid, NO SMOKING, & ROV 4mo...  03/14/15>   She is now tolerating the Klonopin 0.5mg bid, must quit all smoking 7 incr her exercise program- discussed in detail... 05/08/16>   Marilyn [name] quit smoking 3 wks ago & hopefully she can maintain (husb still smokes); she's noted some wheezing esp Qhs & the Stiolto  is not holding her- we will switch to SYMBICORT160-2spBid;  She also c/o DOE w/ exercise & hills but she is overwt, hasn't really been on diet/ exercise program as we outlined for her & she understands that further improvement requires a lot of hard work on her part!  She was concerned that her symptoms were related to reactivation of her Sarcoid- but CXR & ACE remain normal & she is reassured;  Finally she notes that the Klonopin works well for her SOB/ anxiety but 0.5mg Bid makes her too groggy & rec to decr to 1/2 tab Bid as discussed.  Ven Insuffic>  Aware- avoid sodium, elevate, support hose...  Hyperlipid>  She doesn't want meds but weight is up substantially & needs to get wt down...  GI> GERD, Divertics, IBS, Polyps>  Stable on Rx, followed by Marilyn [name] [name] & up to date...  BREAST CANCER> followed by Marilyn [name] on Arimidex...  DJD>  She uses OTC analgesics as needed; notes incr symptoms w/ wt gain & hoping for improvement on diet plan...  Hx Depression>  She was followed by psychiatrist prev on Zoloft & on prn Alpraz which she seldom takes + Wellbutrin started by Marilyn [name]...  Hx Anemia>  Hg stable ~12 w/ sm cells & Fe levels wnl; poss Thalassemia minor, it still wouldn't hurt to take Fe daily supplement...   Patient's Medications  New Prescriptions   BUDESONIDE-FORMOTEROL (SYMBICORT) 80-4.5 MCG/ACT INHALER    Inhale 2 puffs into the lungs 2 (two) times daily.  Previous Medications    ANASTROZOLE (ARIMIDEX) 1 MG TABLET    Take 1 tablet (1 mg total) by mouth daily.   BUPROPION (WELLBUTRIN XL) 150 MG 24 HR TABLET    TAKE 1 TABLET (150 MG TOTAL) BY MOUTH DAILY.   CHOLECALCIFEROL 5000 UNITS TABS    Take 1 tablet by mouth 3 (three) times a week.   CLOBETASOL CREAM (  TEMOVATE) 0.05 %    Apply 1 application topically 2 (two) times daily.   CLONAZEPAM (KLONOPIN) 0.5 MG TABLET    Take 1 tablet (0.5 mg total) by mouth 2 (two) times daily as needed for anxiety.   ESTRADIOL (ESTRACE) 0.1 MG/GM VAGINAL CREAM     Use 1-2 times per week   MULTIPLE VITAMIN (MULTIVITAMIN WITH MINERALS) TABS TABLET    Take 1 tablet by mouth daily.  Modified Medications   No medications on file  Discontinued Medications   TIOTROPIUM BROMIDE-OLODATEROL (STIOLTO RESPIMAT) 2.5-2.5 MCG/ACT AERS    Inhale 2 puffs into the lungs daily.

## 2016-05-23 ENCOUNTER — Telehealth: Payer: Self-pay

## 2016-05-23 NOTE — Telephone Encounter (Signed)
Pt noted on her last CXR that the surgical staples were still in and was asking if this is normal. Told her this was normal. She is asking b/c she has pain in her L breast with reaching and pulling. Stated this could be the staples, it could be scarring, it could be side effect from XRT. Instructed her to call and request an appt if this starts worsening. At present it is not worse. Instructed her this may be her new normal but again to call if it does start to get worse. Next appt 11/23/16.

## 2016-06-15 ENCOUNTER — Telehealth: Payer: Self-pay | Admitting: Internal Medicine

## 2016-06-15 NOTE — Telephone Encounter (Signed)
[facility]20 at 9:50am.  Requesting order for  Bilateral diagnostic mammogram.  Has had a history of breast cancer. Order has been placed in epic for MD signature.

## 2016-06-16 ENCOUNTER — Ambulatory Visit
Admission: RE | Admit: 2016-06-16 | Discharge: 2016-06-16 | Disposition: A | Discharge status: Home / Self Care | Payer: No Typology Code available for payment source | Source: Ambulatory Visit | Attending: Internal Medicine | Admitting: Internal Medicine

## 2016-06-16 DIAGNOSIS — Z853 Personal history of malignant neoplasm of breast: Secondary | ICD-10-CM

## 2016-06-16 HISTORY — DX: Personal history of irradiation: Z92.3

## 2016-06-16 NOTE — Telephone Encounter (Signed)
Dr Alain Marion signed off

## 2016-07-14 ENCOUNTER — Emergency Department (HOSPITAL_BASED_OUTPATIENT_CLINIC_OR_DEPARTMENT_OTHER): Payer: No Typology Code available for payment source

## 2016-07-14 ENCOUNTER — Emergency Department (HOSPITAL_BASED_OUTPATIENT_CLINIC_OR_DEPARTMENT_OTHER)
Admission: EM | Admit: 2016-07-14 | Discharge: 2016-07-15 | Disposition: A | Discharge status: Home / Self Care | Payer: No Typology Code available for payment source | Attending: Emergency Medicine | Admitting: Emergency Medicine

## 2016-07-14 ENCOUNTER — Encounter (HOSPITAL_COMMUNITY): Payer: Self-pay

## 2016-07-14 ENCOUNTER — Ambulatory Visit (HOSPITAL_COMMUNITY)
Admission: EM | Admit: 2016-07-14 | Discharge: 2016-07-14 | Disposition: A | Discharge status: Home / Self Care | Payer: No Typology Code available for payment source | Attending: Internal Medicine | Admitting: Internal Medicine

## 2016-07-14 ENCOUNTER — Encounter (HOSPITAL_BASED_OUTPATIENT_CLINIC_OR_DEPARTMENT_OTHER): Payer: Self-pay | Admitting: Emergency Medicine

## 2016-07-14 DIAGNOSIS — F17210 Nicotine dependence, cigarettes, uncomplicated: Secondary | ICD-10-CM | POA: Insufficient documentation

## 2016-07-14 DIAGNOSIS — K591 Functional diarrhea: Secondary | ICD-10-CM

## 2016-07-14 DIAGNOSIS — R197 Diarrhea, unspecified: Secondary | ICD-10-CM | POA: Insufficient documentation

## 2016-07-14 DIAGNOSIS — R110 Nausea: Secondary | ICD-10-CM | POA: Insufficient documentation

## 2016-07-14 DIAGNOSIS — R1012 Left upper quadrant pain: Secondary | ICD-10-CM | POA: Diagnosis not present

## 2016-07-14 DIAGNOSIS — Z853 Personal history of malignant neoplasm of breast: Secondary | ICD-10-CM | POA: Insufficient documentation

## 2016-07-14 DIAGNOSIS — R1084 Generalized abdominal pain: Secondary | ICD-10-CM

## 2016-07-14 LAB — COMPREHENSIVE METABOLIC PANEL
ALBUMIN: 3.8 g/dL (ref 3.5–5.0)
ALT: 14 U/L (ref 14–54)
AST: 13 U/L — ABNORMAL LOW (ref 15–41)
Alkaline Phosphatase: 78 U/L (ref 38–126)
Anion gap: 7 (ref 5–15)
BUN: 17 mg/dL (ref 6–20)
CHLORIDE: 107 mmol/L (ref 101–111)
CO2: 22 mmol/L (ref 22–32)
CREATININE: 0.82 mg/dL (ref 0.44–1.00)
Calcium: 9.5 mg/dL (ref 8.9–10.3)
GFR calc Af Amer: >60 mL/min (ref >60)
GFR calc non Af Amer: >60 mL/min (ref >60)
GLUCOSE: 91 mg/dL (ref 65–99)
POTASSIUM: 4 mmol/L (ref 3.5–5.1)
SODIUM: 136 mmol/L (ref 135–145)
Total Bilirubin: 0.2 mg/dL — ABNORMAL LOW (ref 0.3–1.2)
Total Protein: 6.7 g/dL (ref 6.5–8.1)

## 2016-07-14 LAB — CBC WITH DIFFERENTIAL/PLATELET
BASOS ABS: 0 10*3/uL (ref 0.0–0.1)
Basophils Relative: 0 %
EOS PCT: 2 %
Eosinophils Absolute: 0.2 10*3/uL (ref 0.0–0.7)
HCT: 39.9 % (ref 36.0–46.0)
Hemoglobin: 13.4 g/dL (ref 12.0–15.0)
Lymphocytes Relative: 26 %
Lymphs Abs: 2.8 10*3/uL (ref 0.7–4.0)
MCH: 23.8 pg — ABNORMAL LOW (ref 26.0–34.0)
MCHC: 33.6 g/dL (ref 30.0–36.0)
MCV: 71 fL — ABNORMAL LOW (ref 78.0–100.0)
MONO ABS: 0.7 10*3/uL (ref 0.1–1.0)
Monocytes Relative: 6 %
NEUTROS ABS: 7.2 10*3/uL (ref 1.7–7.7)
Neutrophils Relative %: 66 %
Platelets: 236 10*3/uL (ref 150–400)
RBC: 5.62 MIL/uL — ABNORMAL HIGH (ref 3.87–5.11)
RDW: 15.4 % (ref 11.5–15.5)
WBC: 10.8 10*3/uL — AB (ref 4.0–10.5)

## 2016-07-14 LAB — URINALYSIS, ROUTINE W REFLEX MICROSCOPIC
Glucose, UA: NEGATIVE mg/dL
Hgb urine dipstick: NEGATIVE
Ketones, ur: 15 mg/dL — AB
LEUKOCYTES UA: NEGATIVE
NITRITE: NEGATIVE
PH: 5.5 (ref 5.0–8.0)
PROTEIN: NEGATIVE mg/dL
Specific Gravity, Urine: 1.028 (ref 1.005–1.030)

## 2016-07-14 LAB — LIPASE, BLOOD: LIPASE: 21 U/L (ref 11–51)

## 2016-07-14 NOTE — ED Triage Notes (Signed)
Pt said since Saturday she has been having GI upset off and on. She is experiencing diarrhea, abdominal cramps and nausea without vomiting. Has not tried any otc medications for it but said it comes off and on with nausea. No fever. Was at the urgent care and sent here for a possible CT scan

## 2016-07-14 NOTE — ED Triage Notes (Signed)
Pt said since Saturday she has been having GI upset off and on. She is experiencing diarrhea, abdominal cramps and nausea without vomiting. Has not tried any otc medications for it but said it comes off and on with nausea. No fever.

## 2016-07-14 NOTE — ED Provider Notes (Signed)
[facility] Provider Note   CSN: [id number] Arrival date & time: 07/14/16  2031   By signing my name below, I, Eunice Blase, attest that this documentation has been prepared under the direction and in the presence of [place] R Russo, PA-C. Electronically Signed: Eunice Blase, Scribe. 07/14/16. 11:11 PM.   History   Chief Complaint Chief Complaint  Patient presents with  . Abdominal Pain   The history is provided by the patient and medical records. No language interpreter was used.    Marilyn [name] is a 61 y.o. female h/o diverticulosis, who presents to the Emergency Department with concern for intermittent abdominal pain x 6 days. Associated intermittent chills, watery diarrhea and nausea noted. She describes upper abdominal cramping and burning ~1 hour after eating. She states ginger ale and belching have mildly relieved this mildly. No other modifying factors noted. No vaginal bleeding or discharge; Recent mammogram and pap smear noted with NL results. No alcohol use noted. No vomiting, frequency, dysuria, chest pain, SOB from baseline, fever, headache, weakness, fatigue, vaginal bleeding/ discharge. No h/o gallstones or ulcers. No other complaints at this time.   Past Medical History:  Diagnosis Date  . Allergy   . Anemia    hx  . Anxiety    panic disorder  . Breast cancer ([place]) 06/06/2011   bc  left breast 3 o'clock dx=invasive ductal ca uoqER/PR=positive  . Cancer ([place])    BREAST - left  . Cigarette smoker   . Colon polyp   . Complication of anesthesia    DIFFICULTY AWAKENING  . Depression   . Diverticulosis of colon   . DJD (degenerative joint disease)   . DJD (degenerative joint disease)   . GERD (gastroesophageal reflux disease)    Pt. denies having GERD. Unable to remove it.  Marland Kitchen Headache(784.0)   . History of anemia   . History of bronchitis   . History of sarcoidosis   . Hyperlipidemia    no meds needed  . Incisional breast wound    AT AGE 37  BILATERAL INCISION TO BREAST MADE  . Leg swelling   . Panic disorder   . Personal history of radiation therapy   . S/P radiation therapy 08/17/11 - 09/28/11   LLQ - 50 Gy/25 Fractions with Boost of 10 Gy / 5 fractions  . Sialoadenitis of submandibular gland 2016  . Vitamin D deficiency     Patient Active Problem List   Diagnosis Date Noted  . Ex-cigarette smoker 05/08/2016  . Obesity 03/13/2016  . Dyspnea 11/09/2015  . Tobacco smoker within last 12 months 08/28/2015  . Rash and nonspecific skin eruption 07/27/2015  . Well adult exam 07/23/2014  . Sialoadenitis 06/02/2014  . Edema 07/07/2013  . AB (asthmatic bronchitis) 06/23/2013  . Anxiety   . Breast cancer in female [facility]) 06/09/2011  . COLONIC POLYPS 01/25/2010  . DIVERTICULOSIS OF COLON 01/25/2010  . DEGENERATIVE JOINT DISEASE 11/06/2008  . PANIC DISORDER 07/23/2008  . Sarcoidosis 10/13/2007  . Vitamin D deficiency 10/13/2007  . ANEMIA 10/13/2007  . CIGARETTE SMOKER 10/13/2007  . BRONCHITIS 10/13/2007  . UTI 10/13/2007  . HYPERLIPIDEMIA 04/23/2007  . Adjustment disorder with mixed anxiety and depressed mood 04/23/2007  . Venous (peripheral) insufficiency 04/23/2007  . HEADACHE 04/23/2007    Past Surgical History:  Procedure Laterality Date  . BIOPSY BREAST     left breast  as a teenager  benign  . BREAST BIOPSY Left 06/06/2011  . BREAST EXCISIONAL BIOPSY Bilateral   .  BREAST LUMPECTOMY Left 2013  . BREAST SURGERY  07/05/11   left breast lumpectomy with needle loc & axillary sln bx  . BUNIONECTOMY  2011   bilateral by Dr Little Ishikawa  . COLONOSCOPY     polyp  . cyst removed from right hand  1995  . TONSILLECTOMY  1972  . TOTAL ABDOMINAL HYSTERECTOMY  2000   Dr Nori Riis  . TRIGGER FINGER RELEASE  2008   Dr Burney Gauze    OB History    Obstetric Comments   Menarche age 19, nulliparity, HRT x 1 year, Hysterectomy       Home Medications    Prior to Admission medications   Medication Sig Start Date End Date Taking?  Authorizing Provider  ALPRAZolam Duanne Moron) 0.5 MG tablet Take 1/2 to 1 tablet by mouth three times daily as needed for anxiety 07/27/15   Plotnikov, Evie Lacks, MD  anastrozole (ARIMIDEX) 1 MG tablet Take 1 tablet (1 mg total) by mouth daily. 11/25/15   Nicholas Lose, MD  budesonide-formoterol (SYMBICORT) 80-4.5 MCG/ACT inhaler Inhale 2 puffs into the lungs 2 (two) times daily. 05/08/16   Noralee Space, MD  buPROPion (WELLBUTRIN XL) 150 MG 24 hr tablet TAKE 1 TABLET (150 MG TOTAL) BY MOUTH DAILY. 11/25/15   Nicholas Lose, MD  Cholecalciferol 5000 UNITS TABS Take 1 tablet by mouth 3 (three) times a week.    [provider]  clobetasol cream (TEMOVATE) 7.32 % Apply 1 application topically 2 (two) times daily. 07/27/15   Plotnikov, Evie Lacks, MD  clonazePAM (KLONOPIN) 0.5 MG tablet Take 1 tablet (0.5 mg total) by mouth 2 (two) times daily as needed for anxiety. 04/05/16   Noralee Space, MD  estradiol (ESTRACE) 0.1 MG/GM vaginal cream Use 1-2 times per week    [provider]  Multiple Vitamin (MULTIVITAMIN WITH MINERALS) TABS tablet Take 1 tablet by mouth daily.    [provider]  omeprazole (PRILOSEC) 20 MG capsule Take 1 capsule (20 mg total) by mouth daily. 07/15/16   Russo, [place] N, PA-C    Family History Family History  Problem Relation Age of Onset  . Anemia Mother   . Other Mother        + H Pylori  . Prostate cancer Father   . Colon cancer Father        diagnosed 2009  . Cancer Father        colon and prostate cancer  . Breast cancer Sister        #1  . Cancer Sister 30       breast cancer, lung, pancreatic  . Sarcoidosis Sister        #2  . Multiple sclerosis Sister        #2  . Hypertension Brother        #1  . Cancer Brother        prostate cancer  . Prostate cancer Brother        #1  . Hyperlipidemia Brother        #1  . Cancer Cousin        breast cancer  . Esophageal cancer Neg Hx   . Stomach cancer Neg Hx   . Rectal cancer Neg Hx      Social History Social History  Substance Use Topics  . Smoking status: Current Every Day Smoker    Packs/day: 0.25    Types: Cigarettes    Last attempt to quit: 04/16/2012  . Smokeless tobacco: Never Used  Comment: trying to quit with wellbutrin  . Alcohol use 0.0 oz/week     Comment: social     Allergies   Penicillins   Review of Systems Review of Systems  Constitutional: Positive for chills. Negative for fever.  Respiratory: Negative for shortness of breath.   Cardiovascular: Negative for chest pain.  Gastrointestinal: Positive for abdominal pain, diarrhea and nausea. Negative for abdominal distention, blood in stool and vomiting.  Genitourinary: Negative for decreased urine volume, difficulty urinating, dysuria, frequency, vaginal bleeding and vaginal discharge.  Musculoskeletal: Negative for back pain.  Skin: Negative for color change and wound.  Neurological: Negative for weakness and headaches.  All other systems reviewed and are negative.    Physical Exam Updated Vital Signs BP 132/65 (BP Location: Right Arm)   Pulse 74   Temp 98.4 F (36.9 C) (Oral)   Resp 12   Ht 5\' 4"  (1.626 m)   Wt 215 lb (97.5 kg)   SpO2 99%   BMI 36.90 kg/m   Physical Exam  Constitutional: She appears well-developed and well-nourished.  HENT:  Head: Normocephalic and atraumatic.  Eyes: Conjunctivae and EOM are normal. Pupils are equal, round, and reactive to light.  Cardiovascular: Normal rate, regular rhythm, normal heart sounds and intact distal pulses.  Exam reveals no friction rub.   No murmur heard. Pulmonary/Chest: Effort normal and breath sounds normal. No respiratory distress. She has no wheezes. She has no rales.  Abdominal: Soft. Normal appearance and bowel sounds are normal. She exhibits no distension. There is tenderness in the epigastric area and left upper quadrant. There is positive Murphy's sign. There is no rebound and no guarding.  No CVA tenderness to  percussion  Neurological: She is alert.  Skin: Skin is warm.  Psychiatric: She has a normal mood and affect. Her behavior is normal.  Nursing note and vitals reviewed.    ED Treatments / Results  DIAGNOSTIC STUDIES: Oxygen Saturation is 99% on RA, NL by my interpretation.    COORDINATION OF CARE: 11:06 PM-Discussed next steps with pt. Pt verbalized understanding and is agreeable with the plan. Will order labs and medications.  11:11 PM Pt updated. Will order CT.   Labs (all labs ordered are listed, but only abnormal results are displayed) Labs Reviewed  CBC WITH DIFFERENTIAL/PLATELET - Abnormal; Notable for the following:       Result Value   WBC 10.8 (*)    RBC 5.62 (*)    MCV 71.0 (*)    MCH 23.8 (*)    All other components within normal limits  URINALYSIS, ROUTINE W REFLEX MICROSCOPIC - Abnormal; Notable for the following:    Bilirubin Urine SMALL (*)    Ketones, ur 15 (*)    All other components within normal limits  COMPREHENSIVE METABOLIC PANEL - Abnormal; Notable for the following:    AST 13 (*)    Total Bilirubin 0.2 (*)    All other components within normal limits  LIPASE, BLOOD    EKG  EKG Interpretation None       Radiology Ct Abdomen Pelvis W Contrast  Result Date: 07/15/2016 CLINICAL DATA:  Abdominal pain and diarrhea.  Nausea. EXAM: CT ABDOMEN AND PELVIS WITH CONTRAST TECHNIQUE: Multidetector CT imaging of the abdomen and pelvis was performed using the standard protocol following bolus administration of intravenous contrast. CONTRAST:  132mL ISOVUE-300 IOPAMIDOL (ISOVUE-300) INJECTION 61% COMPARISON:  None. FINDINGS: Lower chest: Linear atelectasis in the lower lobes. No consolidation or pleural fluid. Hepatobiliary: Minimal focal fatty  infiltration adjacent with falciform ligament. No focal lesion. Gallbladder is decompressed, no calcified gallstone. No biliary dilatation. Pancreas: No ductal dilatation or inflammation. Spleen: Normal in size without  focal abnormality. Adrenals/Urinary Tract: Normal adrenal glands. No hydronephrosis or perinephric edema. Simple cysts in the upper left kidney measures 2.2 cm. Small cortical hypodensity in the lower left kidney is too small to characterize. Ureters are decompressed. Urinary bladder is physiologically distended. Stomach/Bowel: Stomach physiologically distended. No small bowel inflammation, obstruction or wall thickening. Normal appendix. Formed stool throughout the colon without colonic wall thickening. No pericolonic inflammation. The scattered colonic diverticulosis in the descending and sigmoid colon without acute diverticulitis. Vascular/Lymphatic: Mild aortic atherosclerosis without aneurysm. No adenopathy. Reproductive: Status post hysterectomy. No adnexal masses. Other: No free air, free fluid, or intra-abdominal fluid collection. Tiny fat containing umbilical hernia. Musculoskeletal: There are no acute or suspicious osseous abnormalities. Degenerative disc disease at L4-L5 with vacuum phenomenon. Bone island within L5. IMPRESSION: 1. No explanation for abdominal pain and diarrhea. 2. Minimal colonic diverticulosis without acute diverticulitis. 3. Mild aortic atherosclerosis. Electronically Signed   By: Jeb Levering M.D.   On: 07/15/2016 00:47    Procedures Procedures (including critical care time)  Medications Ordered in ED Medications  iopamidol (ISOVUE-300) 61 % injection 100 mL (100 mLs Intravenous Contrast Given 07/15/16 0029)     Initial Impression / Assessment and Plan / ED Course  I have reviewed the triage vital signs and the nursing notes.  Pertinent labs & imaging results that were available during my care of the patient were reviewed by me and considered in my medical decision making (see chart for details).     Pt w LUQ abdominal pain. Patient is nontoxic, nonseptic appearing, in no apparent distress.  Patient's pain and other symptoms adequately managed in emergency  department. Labs, imaging and vitals reviewed. CT without acute pathology. Patient does not meet the SIRS or Sepsis criteria.  On repeat exam patient does not have a surgical abdomen and there are no peritoneal signs.  No indication of appendicitis, bowel obstruction, bowel perforation, cholecystitis, acute diverticulitis.  Patient discharged home with symptomatic treatment, prilosec, and given strict instructions for follow-up with their primary care physician.  Pt safe for discharge.  Patient discussed with and seen by Dr. Johnney Killian. Discussed results, findings, treatment and follow up. Patient advised of return precautions. Patient verbalized understanding and agreed with plan.    Final Clinical Impressions(s) / ED Diagnoses   Final diagnoses:  LUQ abdominal pain  Functional diarrhea    New Prescriptions New Prescriptions   OMEPRAZOLE (PRILOSEC) 20 MG CAPSULE    Take 1 capsule (20 mg total) by mouth daily.  I personally performed the services described in this documentation, which was scribed in my presence. The recorded information has been reviewed and is accurate.    Russo, [place] N, PA-C 07/15/16 [id number]    Charlesetta Shanks, MD 07/15/16 [phone]

## 2016-07-14 NOTE — ED Provider Notes (Signed)
CSN: [id number]     Arrival date & time 07/14/16  1744 History   First MD Initiated Contact with Patient 07/14/16 1933     Chief Complaint  Patient presents with  . GI Problem    Subjective:   Marilyn [name] is a 61 y.o. female who presents for evaluation of abdominal pain. The pain is described as cramping and is 7/10 in intensity. Pain is located diffusely without radiation. Onset was 1 week ago. Symptoms have been waxing and waning since. Aggravating factors: eating.  Alleviating factors: none. Associated symptoms: chills and diarrhea. The patient denies anorexia, hematochezia, melena and vomiting. The patient has had a hysterectomy in the past but no other abdominal surgeries. Last colonoscopy was approximately 2 years ago. At that time, she had benign polyps removed. She undergoes colonoscopies every 5 years due to family history (father) of colon cancer.  The following portions of the patient's history were reviewed and updated as appropriate: allergies, current medications, past family history, past medical history, past social history, past surgical history and problem list.        Past Medical History:  Diagnosis Date  . Allergy   . Anemia    hx  . Anxiety    panic disorder  . Breast cancer ([place]) 06/06/2011   bc  left breast 3 o'clock dx=invasive ductal ca uoqER/PR=positive  . Cancer ([place])    BREAST - left  . Cigarette smoker   . Colon polyp   . Complication of anesthesia    DIFFICULTY AWAKENING  . Depression   . Diverticulosis of colon   . DJD (degenerative joint disease)   . DJD (degenerative joint disease)   . GERD (gastroesophageal reflux disease)    Pt. denies having GERD. Unable to remove it.  Marland Kitchen Headache(784.0)   . History of anemia   . History of bronchitis   . History of sarcoidosis   . Hyperlipidemia    no meds needed  . Incisional breast wound    AT AGE 1 BILATERAL INCISION TO BREAST MADE  . Leg swelling   . Panic disorder   . Personal history  of radiation therapy   . S/P radiation therapy 08/17/11 - 09/28/11   LLQ - 50 Gy/25 Fractions with Boost of 10 Gy / 5 fractions  . Sialoadenitis of submandibular gland 2016  . Vitamin D deficiency    Past Surgical History:  Procedure Laterality Date  . BIOPSY BREAST     left breast  as a teenager  benign  . BREAST BIOPSY Left 06/06/2011  . BREAST EXCISIONAL BIOPSY Bilateral   . BREAST LUMPECTOMY Left 2013  . BREAST SURGERY  07/05/11   left breast lumpectomy with needle loc & axillary sln bx  . BUNIONECTOMY  2011   bilateral by Dr Little Ishikawa  . COLONOSCOPY     polyp  . cyst removed from right hand  1995  . TONSILLECTOMY  1972  . TOTAL ABDOMINAL HYSTERECTOMY  2000   Dr Nori Riis  . TRIGGER FINGER RELEASE  2008   Dr Burney Gauze   Family History  Problem Relation Age of Onset  . Anemia Mother   . Other Mother        + H Pylori  . Prostate cancer Father   . Colon cancer Father        diagnosed 2009  . Cancer Father        colon and prostate cancer  . Breast cancer Sister        #1  .  Cancer Sister 30       breast cancer, lung, pancreatic  . Sarcoidosis Sister        #2  . Multiple sclerosis Sister        #2  . Hypertension Brother        #1  . Cancer Brother        prostate cancer  . Prostate cancer Brother        #1  . Hyperlipidemia Brother        #1  . Cancer Cousin        breast cancer  . Esophageal cancer Neg Hx   . Stomach cancer Neg Hx   . Rectal cancer Neg Hx    Social History  Substance Use Topics  . Smoking status: Current Every Day Smoker    Packs/day: 0.25    Types: Cigarettes    Last attempt to quit: 04/16/2012  . Smokeless tobacco: Never Used     Comment: trying to quit with wellbutrin  . Alcohol use 0.0 oz/week     Comment: social   OB History    Obstetric Comments   Menarche age 6, nulliparity, HRT x 1 year, Hysterectomy     Review of Systems  Constitutional: Positive for chills. Negative for fever.  Gastrointestinal: Positive for abdominal pain,  diarrhea, nausea and vomiting. Negative for abdominal distention, anal bleeding, blood in stool, constipation and rectal pain.    Allergies  Penicillins  Home Medications   Prior to Admission medications   Medication Sig Start Date End Date Taking? Authorizing Provider  ALPRAZolam Duanne Moron) 0.5 MG tablet Take 1/2 to 1 tablet by mouth three times daily as needed for anxiety 07/27/15  Yes Plotnikov, Evie Lacks, MD  anastrozole (ARIMIDEX) 1 MG tablet Take 1 tablet (1 mg total) by mouth daily. 11/25/15  Yes Nicholas Lose, MD  budesonide-formoterol (SYMBICORT) 80-4.5 MCG/ACT inhaler Inhale 2 puffs into the lungs 2 (two) times daily. 05/08/16  Yes Noralee Space, MD  buPROPion (WELLBUTRIN XL) 150 MG 24 hr tablet TAKE 1 TABLET (150 MG TOTAL) BY MOUTH DAILY. 11/25/15  Yes Nicholas Lose, MD  Cholecalciferol 5000 UNITS TABS Take 1 tablet by mouth 3 (three) times a week.   Yes [provider]  clonazePAM (KLONOPIN) 0.5 MG tablet Take 1 tablet (0.5 mg total) by mouth 2 (two) times daily as needed for anxiety. 04/05/16  Yes Noralee Space, MD  Multiple Vitamin (MULTIVITAMIN WITH MINERALS) TABS tablet Take 1 tablet by mouth daily.   Yes [provider]  clobetasol cream (TEMOVATE) 6.94 % Apply 1 application topically 2 (two) times daily. 07/27/15   Plotnikov, Evie Lacks, MD  estradiol (ESTRACE) 0.1 MG/GM vaginal cream Use 1-2 times per week    [provider]   Meds Ordered and Administered this Visit  Medications - No data to display  BP (!) 146/77 (BP Location: Right Arm)   Pulse 85   Temp 98.4 F (36.9 C) (Oral)   Resp 20   SpO2 100%  No data found.   Physical Exam  Constitutional: She is oriented to person, place, and time. She appears well-developed and well-nourished.  Cardiovascular: Normal rate and regular rhythm.   Pulmonary/Chest: Effort normal and breath sounds normal.  Abdominal: Soft. Bowel sounds are normal. She exhibits no distension. There is tenderness. There is  no rebound and no guarding.  Musculoskeletal: Normal range of motion.  Neurological: She is alert and oriented to person, place, and time.  Skin: Skin is warm  and dry.  Psychiatric: She has a normal mood and affect.    Urgent Care Course     Procedures (including critical care time)  Labs Review Labs Reviewed - No data to display  Imaging Review No results found.   Visual Acuity Review  Right Eye Distance:   Left Eye Distance:   Bilateral Distance:    Right Eye Near:   Left Eye Near:    Bilateral Near:         MDM   1. Generalized abdominal pain    61 year old female with a one-week history of waxing and waning generalized abdominal pain. She is afebrile with a relatively benign physical exam. Etiology of pain is unknown at this time. Differentials include but limited to biliary colic, acute cholecystitis, acute diverticulitis and peptic ulcer disease. Patient would benefit from CT scan of abdomen for further evaluation; however, we do not have the ability to perform such imaging at this facility. Patient advised to go to the ER for further evaluation.  Discussed possible diagnosis with patient. All questions have been answered and all concerns have been addressed. The patient verbalized understanding and had no further questions. She is agreeable to going to the ED for further evaluation and will get there by private vehicle.    Enrique Sack, [place] 07/14/16 [place], [place], [place] 07/14/16 1944

## 2016-07-14 NOTE — Discharge Instructions (Signed)
Go to [facility] or an ED of your choice for further evaluation of your abdominal pain

## 2016-07-15 MED ORDER — OMEPRAZOLE 20 MG PO CPDR: 20.0000 mg | DELAYED_RELEASE_CAPSULE | Freq: Every day | ORAL | 0 refills | Status: DC

## 2016-07-15 MED ORDER — IOPAMIDOL (ISOVUE-300) INJECTION 61%
100.0000 mL | Freq: Once | INTRAVENOUS | Status: AC | PRN
  Administered 2016-07-15: 100 mL via INTRAVENOUS

## 2016-07-15 NOTE — Discharge Instructions (Signed)
Please read instructions below. Begin taking Prilosec 1 time per day, for stomach upset. Drink plenty of water. Schedule an appointment with your primary care provider to follow-up on your visit today.  Return to the ER for fever, if you stop having bowel movements, or new or worsening symptoms.

## 2016-07-15 NOTE — ED Notes (Signed)
ED Provider at bedside. 

## 2016-07-28 ENCOUNTER — Encounter: Payer: Self-pay | Admitting: Internal Medicine

## 2016-07-28 ENCOUNTER — Ambulatory Visit (INDEPENDENT_AMBULATORY_CARE_PROVIDER_SITE_OTHER): Payer: No Typology Code available for payment source | Admitting: Internal Medicine

## 2016-07-28 VITALS — BP 138/78 | HR 94 | Temp 98.1°F | Ht 62.25 in | Wt 216.2 lb

## 2016-07-28 DIAGNOSIS — F410 Panic disorder [episodic paroxysmal anxiety] without agoraphobia: Secondary | ICD-10-CM | POA: Diagnosis not present

## 2016-07-28 DIAGNOSIS — E559 Vitamin D deficiency, unspecified: Secondary | ICD-10-CM

## 2016-07-28 DIAGNOSIS — E6609 Other obesity due to excess calories: Secondary | ICD-10-CM | POA: Diagnosis not present

## 2016-07-28 DIAGNOSIS — Z6837 Body mass index (BMI) 37.0-37.9, adult: Secondary | ICD-10-CM

## 2016-07-28 DIAGNOSIS — R1084 Generalized abdominal pain: Secondary | ICD-10-CM | POA: Diagnosis not present

## 2016-07-28 DIAGNOSIS — Z0000 Encounter for general adult medical examination without abnormal findings: Secondary | ICD-10-CM | POA: Diagnosis not present

## 2016-07-28 DIAGNOSIS — R109 Unspecified abdominal pain: Secondary | ICD-10-CM | POA: Insufficient documentation

## 2016-07-28 MED ORDER — ALPRAZOLAM 0.5 MG PO TABS
ORAL_TABLET | ORAL | 2 refills | Status: DC
Start: 2016-07-28 — End: 2016-10-20

## 2016-07-28 MED ORDER — CLOBETASOL PROPIONATE 0.05 % EX CREA: 1.0000 "application " | TOPICAL_CREAM | Freq: Two times a day (BID) | CUTANEOUS | 3 refills | Status: DC

## 2016-07-28 NOTE — Patient Instructions (Signed)
Try Turmeric for pain 

## 2016-07-28 NOTE — Assessment & Plan Note (Addendum)
5/18 gastroenter[address] CT ok Prilosec x 1 mo

## 2016-07-28 NOTE — Assessment & Plan Note (Addendum)
On vit D 15000 u/wk

## 2016-07-28 NOTE — Assessment & Plan Note (Signed)
Xanax prn  Potential benefits of a long term benzodiazepines  use as well as potential risks  and complications were explained to the patient and were aknowledged. 

## 2016-07-28 NOTE — Progress Notes (Signed)
Subjective:  Patient ID: Marilyn [name], female    DOB: 1955/10/25  Age: 61 y.o. MRN: [id number]  CC: Annual Exam   HPI Marilyn [name] presents for a well exam. The pt had a gatroenteritis sx's on 5/18 and went to ER, had a CT of abd - sx's are resolving on Protonix...  Outpatient Medications Prior to Visit  Medication Sig Dispense Refill  . ALPRAZolam (XANAX) 0.5 MG tablet Take 1/2 to 1 tablet by mouth three times daily as needed for anxiety 90 tablet 2  . anastrozole (ARIMIDEX) 1 MG tablet Take 1 tablet (1 mg total) by mouth daily. 90 tablet 3  . budesonide-formoterol (SYMBICORT) 80-4.5 MCG/ACT inhaler Inhale 2 puffs into the lungs 2 (two) times daily. 1 Inhaler 11  . buPROPion (WELLBUTRIN XL) 150 MG 24 hr tablet TAKE 1 TABLET (150 MG TOTAL) BY MOUTH DAILY. 90 tablet 3  . Cholecalciferol 5000 UNITS TABS Take 1 tablet by mouth 3 (three) times a week.    . clonazePAM (KLONOPIN) 0.5 MG tablet Take 1 tablet (0.5 mg total) by mouth 2 (two) times daily as needed for anxiety. 60 tablet 5  . Multiple Vitamin (MULTIVITAMIN WITH MINERALS) TABS tablet Take 1 tablet by mouth daily.    Marland Kitchen omeprazole (PRILOSEC) 20 MG capsule Take 1 capsule (20 mg total) by mouth daily. 14 capsule 0  . clobetasol cream (TEMOVATE) 4.76 % Apply 1 application topically 2 (two) times daily. 60 g 3  . estradiol (ESTRACE) 0.1 MG/GM vaginal cream Use 1-2 times per week     No facility-administered medications prior to visit.     ROS Review of Systems  Constitutional: Negative for activity change, appetite change, chills, fatigue and unexpected weight change.  HENT: Negative for congestion, mouth sores and sinus pressure.   Eyes: Negative for visual disturbance.  Respiratory: Negative for cough and chest tightness.   Gastrointestinal: Negative for abdominal pain and nausea.  Genitourinary: Negative for difficulty urinating, frequency and vaginal pain.  Musculoskeletal: Negative for back pain and gait problem.    Skin: Negative for pallor and rash.  Neurological: Negative for dizziness, tremors, weakness, numbness and headaches.  Psychiatric/Behavioral: Negative for confusion, sleep disturbance and suicidal ideas.    Objective:  BP 138/78 (BP Location: Right Arm, Patient Position: Sitting, Cuff Size: Normal)   Pulse 94   Temp 98.1 F (36.7 C) (Oral)   Ht 5' 2.25" (1.581 m)   Wt 216 lb 4 oz (98.1 kg)   SpO2 97%   BMI 39.24 kg/m   BP Readings from Last 3 Encounters:  07/28/16 138/78  07/14/16 132/65  07/14/16 (!) 146/77    Wt Readings from Last 3 Encounters:  07/28/16 216 lb 4 oz (98.1 kg)  07/14/16 215 lb (97.5 kg)  05/08/16 222 lb 8 oz (100.9 kg)    Physical Exam  Constitutional: She appears well-developed. No distress.  HENT:  Head: Normocephalic.  Right Ear: External ear normal.  Left Ear: External ear normal.  Nose: Nose normal.  Mouth/Throat: Oropharynx is clear and moist.  Eyes: Conjunctivae are normal. Pupils are equal, round, and reactive to light. Right eye exhibits no discharge. Left eye exhibits no discharge.  Neck: Normal range of motion. Neck supple. No JVD present. No tracheal deviation present. No thyromegaly present.  Cardiovascular: Normal rate, regular rhythm and normal heart sounds.   Pulmonary/Chest: No stridor. No respiratory distress. She has no wheezes.  Abdominal: Soft. Bowel sounds are normal. She exhibits no distension and no mass. There  is no tenderness. There is no rebound and no guarding.  Musculoskeletal: She exhibits no edema or tenderness.  Lymphadenopathy:    She has no cervical adenopathy.  Neurological: She displays normal reflexes. No cranial nerve deficit. She exhibits normal muscle tone. Coordination normal.  Skin: No rash noted. No erythema.  Psychiatric: She has a normal mood and affect. Her behavior is normal. Judgment and thought content normal.    Lab Results  Component Value Date   WBC 10.8 (H) 07/14/2016   HGB 13.4 07/14/2016    HCT 39.9 07/14/2016   PLT 236 07/14/2016   GLUCOSE 91 07/14/2016   CHOL 217 (H) 07/27/2015   TRIG 115.0 07/27/2015   HDL 61.80 07/27/2015   LDLDIRECT 118.9 12/18/2011   LDLCALC 132 (H) 07/27/2015   ALT 14 07/14/2016   AST 13 (L) 07/14/2016   NA 136 07/14/2016   K 4.0 07/14/2016   CL 107 07/14/2016   CREATININE 0.82 07/14/2016   BUN 17 07/14/2016   CO2 22 07/14/2016   TSH 2.23 07/27/2015    Ct Abdomen Pelvis W Contrast  Result Date: 07/15/2016 CLINICAL DATA:  Abdominal pain and diarrhea.  Nausea. EXAM: CT ABDOMEN AND PELVIS WITH CONTRAST TECHNIQUE: Multidetector CT imaging of the abdomen and pelvis was performed using the standard protocol following bolus administration of intravenous contrast. CONTRAST:  18mL ISOVUE-300 IOPAMIDOL (ISOVUE-300) INJECTION 61% COMPARISON:  None. FINDINGS: Lower chest: Linear atelectasis in the lower lobes. No consolidation or pleural fluid. Hepatobiliary: Minimal focal fatty infiltration adjacent with falciform ligament. No focal lesion. Gallbladder is decompressed, no calcified gallstone. No biliary dilatation. Pancreas: No ductal dilatation or inflammation. Spleen: Normal in size without focal abnormality. Adrenals/Urinary Tract: Normal adrenal glands. No hydronephrosis or perinephric edema. Simple cysts in the upper left kidney measures 2.2 cm. Small cortical hypodensity in the lower left kidney is too small to characterize. Ureters are decompressed. Urinary bladder is physiologically distended. Stomach/Bowel: Stomach physiologically distended. No small bowel inflammation, obstruction or wall thickening. Normal appendix. Formed stool throughout the colon without colonic wall thickening. No pericolonic inflammation. The scattered colonic diverticulosis in the descending and sigmoid colon without acute diverticulitis. Vascular/Lymphatic: Mild aortic atherosclerosis without aneurysm. No adenopathy. Reproductive: Status post hysterectomy. No adnexal masses. Other:  No free air, free fluid, or intra-abdominal fluid collection. Tiny fat containing umbilical hernia. Musculoskeletal: There are no acute or suspicious osseous abnormalities. Degenerative disc disease at L4-L5 with vacuum phenomenon. Bone island within L5. IMPRESSION: 1. No explanation for abdominal pain and diarrhea. 2. Minimal colonic diverticulosis without acute diverticulitis. 3. Mild aortic atherosclerosis. Electronically Signed   By: Jeb Levering M.D.   On: 07/15/2016 00:47    Assessment & Plan:   There are no diagnoses linked to this encounter. I am having Ms. Haughn maintain her multivitamin with minerals, Cholecalciferol, ALPRAZolam, buPROPion, anastrozole, estradiol, clonazePAM, budesonide-formoterol, omeprazole, and clobetasol cream.  Meds ordered this encounter  Medications  . clobetasol cream (TEMOVATE) 0.05 %    Sig: Apply 1 application topically 2 (two) times daily.    Dispense:  60 g    Refill:  3     Follow-up: No Follow-up on file.  Walker Kehr, MD

## 2016-07-28 NOTE — Assessment & Plan Note (Addendum)
We discussed age appropriate health related issues, including available/recomended screening tests and vaccinations. We discussed a need for adhering to healthy diet and exercise. Labs were ordered to be later reviewed . All questions were answered. Shingrix discussed

## 2016-07-28 NOTE — Assessment & Plan Note (Signed)
Wt Readings from Last 3 Encounters:  07/28/16 216 lb 4 oz (98.1 kg)  07/14/16 215 lb (97.5 kg)  05/08/16 222 lb 8 oz (100.9 kg)

## 2016-08-14 ENCOUNTER — Ambulatory Visit (INDEPENDENT_AMBULATORY_CARE_PROVIDER_SITE_OTHER): Payer: No Typology Code available for payment source | Admitting: Pulmonary Disease

## 2016-08-14 ENCOUNTER — Encounter: Payer: Self-pay | Admitting: Pulmonary Disease

## 2016-08-14 ENCOUNTER — Other Ambulatory Visit (INDEPENDENT_AMBULATORY_CARE_PROVIDER_SITE_OTHER): Payer: No Typology Code available for payment source

## 2016-08-14 VITALS — BP 130/74 | HR 82 | Temp 98.6°F | Ht 65.0 in | Wt 214.2 lb

## 2016-08-14 DIAGNOSIS — Z87891 Personal history of nicotine dependence: Secondary | ICD-10-CM

## 2016-08-14 DIAGNOSIS — Z0000 Encounter for general adult medical examination without abnormal findings: Secondary | ICD-10-CM

## 2016-08-14 DIAGNOSIS — D869 Sarcoidosis, unspecified: Secondary | ICD-10-CM

## 2016-08-14 DIAGNOSIS — R0609 Other forms of dyspnea: Secondary | ICD-10-CM

## 2016-08-14 DIAGNOSIS — F419 Anxiety disorder, unspecified: Secondary | ICD-10-CM

## 2016-08-14 DIAGNOSIS — Z6836 Body mass index (BMI) 36.0-36.9, adult: Secondary | ICD-10-CM

## 2016-08-14 LAB — BASIC METABOLIC PANEL
BUN: 14 mg/dL (ref 6–23)
CO2: 26 meq/L (ref 19–32)
Calcium: 9.9 mg/dL (ref 8.4–10.5)
Chloride: 107 mEq/L (ref 96–112)
Creatinine, Ser: 0.73 mg/dL (ref 0.40–1.20)
GFR: 103.73 mL/min (ref >60.00)
GLUCOSE: 95 mg/dL (ref 70–99)
POTASSIUM: 3.6 meq/L (ref 3.5–5.1)
SODIUM: 140 meq/L (ref 135–145)

## 2016-08-14 LAB — HEPATIC FUNCTION PANEL
ALBUMIN: 4.1 g/dL (ref 3.5–5.2)
ALK PHOS: 87 U/L (ref 39–117)
ALT: 12 U/L (ref 0–35)
AST: 13 U/L (ref 0–37)
BILIRUBIN DIRECT: 0.1 mg/dL (ref 0.0–0.3)
TOTAL PROTEIN: 7 g/dL (ref 6.0–8.3)
Total Bilirubin: 0.5 mg/dL (ref 0.2–1.2)

## 2016-08-14 LAB — LIPID PANEL
CHOL/HDL RATIO: 3
Cholesterol: 210 mg/dL — ABNORMAL HIGH (ref 0–200)
HDL: 62.1 mg/dL (ref >39.00)
LDL Cholesterol: 131 mg/dL — ABNORMAL HIGH (ref 0–99)
NonHDL: 148.24
Triglycerides: 85 mg/dL (ref 0.0–149.0)
VLDL: 17 mg/dL (ref 0.0–40.0)

## 2016-08-14 LAB — URINALYSIS
Ketones, ur: 40 — AB
LEUKOCYTES UA: NEGATIVE
Nitrite: NEGATIVE
PH: 5.5 (ref 5.0–8.0)
Specific Gravity, Urine: >=1.03 — AB (ref 1.000–1.030)
TOTAL PROTEIN, URINE-UPE24: NEGATIVE
Urine Glucose: NEGATIVE
Urobilinogen, UA: 0.2 (ref 0.0–1.0)

## 2016-08-14 LAB — TSH: TSH: 0.87 u[IU]/mL (ref 0.35–4.50)

## 2016-08-14 NOTE — Progress Notes (Signed)
Subjective:    Patient ID: Marilyn [name], female    DOB: 1955-06-15, 61 y.o.   MRN: [id number]  HPI 61 y/o BF ex-smoker who quit 07/2015 and followed w/ hx "alveolar" sarcoidosis (exquisitely sens to Pred rx), and recurrent bronchitic infections... Marilyn [name]) remarried & her last name changed to [facility]... ~  SEE PREV EPIC NOTES FOR OLDER DATA >>     CXR 10/13 showed normal heart size, atherosclerotic calcif, clear lungs, surg clips within the left breast...  LABS 10/13:  FLP- on diet alone w/ TChol=214 LDL 119;  Chems- wnl;  CBC- ok w/ Hg=12.2 MCV=75 & rec to start on Fe;  TSH=1.65;  VitD=26  LABS 4/14:  FLP- ok on diet alone x LDL=115;  Chems- wnl;  CBC- ok w/ Hg=12.6 MCV=75;  TSH=1.40...   ~  June 23, 2013:  Yearly [place] notes some cough, nasal congestion, drainage, clear mucus; she is still smoking ~5cig/d she says & asked to quit completely; we discussed Mucinex, Delsym, Tessalon... She's had a lot of side effects from Arimidex (stiffness, "I feel old", wt gain) and felt better off this med- but now she is back on it per [name]...  We reviewed the following medical problems during today's office visit >>     Bronchitis, Sarcoid, Smoker> still smoking 1/4-1/2ppd, off inhalers, uses Mucinex prn; notes mild cough, sm amt clear phlegm, no CP, +Sob/Doe w/o change & asked to incr exercise, get wt down...    VI> trace edema; she knows to elim sodium, elev legs, wear support hose, not on diuretics as yet...    Chol> on diet alone; FLP 4/14 shows TChol 200, TG 100, HDL 65, LDL 115    Overweight> she increased from 160#=> 238# due to Zoloft & Arimidex she says; we reviewed diet, exercise, wt reduction strategies...    GI- GERD, Divertics, IBS, Polyp, +FamHx> she continues to do well & denies abd pain, n/v, d/c, blood seen, etc; last colon 2011 & f/u due 86yr [name]...    Breast Cancer> stage 1 invasive ductal carcinoma, s/p lumpectomy & sentinel node bx (neg) 4/13, followed  by XRT 6-8/13, w/ Arimidex'1mg'$ /d starting 8/13 per [name] & enduring the side effects...    DJD, VitD> off Tramadol, on OTC meds as needed; +Calcium/ VitD1000; she notes some left hand paresthesias- rec wrist splint trial...    Headaches> no recent problems, she uses OTC meds as needed...    Anxiety, Panic> on Alpraz0.'5mg'$  prn & WellbutrinXL150 (per [facility]; she is under a lot of stress...    Anemia> CBCs in epic shows Hg ~12 range... We reviewed prob list, meds, xrays and labs> see below for updates >>   CXR 4/15 showed norm heart size, clear lungs, no adenopathy, NAD...  PFT 4/15 showed FVC=3.49 (127%), FEV1=2.45 (112%), %1sec=70, mid-flows=61%predicted...  LABS 1/15-3/15: reviewed...   ~  September 27, 2015:  27 month ROV & pulmonary follow up visit>  Marilyn [name] 04/2015 & her new last name is [place]; her PCP is [name]...    61y/o BF w/ a remote hx of "alveolar sarcoid" & she has been off Pred since 1992- see below;  She has been a mild cigarette smoker- generally <1/2ppd x yrs w/ ~30 pack-yr smoking hx; she tells me that she quit ~1 month ago!  She notes that was recently diagnosed w/ COPD based on a CXR from 06/2015 showing hyperinflation (otherw clear) per the radiologist;  However she had Spirometry 05/2013 showing superphysiologic numbers and  just some small airways dis w/ mid-flows at 61% predicted;  She has not prev required inhalers, but she was given a trial of Stiolto- 2sp daily from [name] & she says she feels some better on this...    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    MRenessaalso admit to quite a bit of stress over the yrs- Father is ill w/ hx colon cancer, prostate cancer, and bladder cancer; one sister passed at 641w/ hx breast cancer, lung cancer, and pancreatic cancer; one sister passed at 67from ?sarcoid, ?MS;  [name] has a hx anxiety & panic disorder on prn Alprazolam, also followed by [name] at the cancer center for hx breast cancer- on Arimidex...     See problem list above...  EXAM shows Afeb, VSS, O2sat=99% on RA;  Wt=221#, 5'5"Tall, BMI=36-7;  HEENT- neg, mallampati1;  Chest- clear w/o w/r/r;  Heart- RR w/o m/r/g;  Abd- soft, nontender, neg;  Ext- neg w/o c/c/e;  Neuro- intact...  CXR 07/27/15>  Norm heart size, sl hyperinflation, clear lungs, left breast clips and scoliosis, NAD...  FullPFT 09/29/15>  FVC=3.04 (111%), FEV1=2.18 (102%), %1sec=72, mid-flows sl reduced at 70% predicted;  TLC=5.51 (105%), RV=2.60 (125%), RV/TLC=47%;  DLCO=58% predicted;  This is c/w min airflow obstruction mostly in the small airways, some mild air trapping & decr in DLCO...   LABS 06/2015 in Epic> reviewed-- CBC is ok w/ Hg=12.6;  Chems- wnl;  Thyroid- wnl... IMP/PLAN>>  [name] has been a modest smoker & quit 137mogo; her CXR report indicates sl hyperinflation (& clear) but her PFT again does not reveal much airflow obstruction, lung volumes show some mild air trapping & curiously her DLCO is mod reduced;  She has a remote hx of Sarcoidosis but at that time she had a very prominent CXR pattern of "alveolar sarcoid" which responded nicely to Pred & a slow taper;  We discussed proceeding w/ Hi-res CT Chest for further eval vs trial of KLONOPIN as a combination relaxer for her symptoms of SOB w/ short term follow up to assertain it's effect on  her breathing;  She would like to try the latter & we will plan recheck in ~6wks...  ~  November 09, 2015:  6wk ROPretty Bayoueports that the Klonopin helped her breathing but 0.28m828mid was too strong- sleepy; she has been under stress w/ family health issues; she denies cough, sput, hemoptysis, CP, palpit, edema; her SOB is much improved but she has had a relapse w/ smoking "I've had a few" she says...     EXAM shows Afeb, VSS, O2sat=98% on RA;  Wt=221#, 5'5"Tall, BMI=36-7;  HEENT- neg, mallampati1;  Chest- clear w/o w/r/r;  Heart- RR w/o m/r/g;  Abd- soft, nontender, neg;  Ext- neg w/o c/c/e;  Neuro- intact... IMP/PLAN>>  Dyspnea> likely related to stress and "chest wall muscle spasm", improved on Klonopin0.'5mg'$ - pt asked to decr to 1/2 tab bid; she is on Stiolto per [name]- 2sp daily & we discussed weaning... Given 2017 FLU vaccine today.    Hx Sarcoidosis in remission    Recurrent bronchitic infections    Cig smoker    PFTs w/ decr DLCO    Medical issues>  HL, GERD, Divertics/ IBS/ polyps & +FamHx colon Ca, Hx breast cancer on Arimidex, Hx anemia & Fe defic, Dysthymia on Wellbutrin & Klonopin...  ~  March 13, 2016:  44moROV & [name] reports that her SOB is improved w/ the Klonopin- '5mg'$  1 tab bid (tol well),  but she notes a dry cough in the cold weather; she remains on Stiolto 1 puff daily and is still smoking ~4 cig/d;  She is deconditioned and still not exercising!  Prob list as above...   EXAM shows Afeb, VSS, O2sat=98% on RA;  Wt=224#, 5'5"Tall, BMI=37;  HEENT- neg, mallampati1;  Chest- clear w/o w/r/r;  Heart- RR w/o m/r/g;  Abd- soft, nontender, neg;  Ext- neg w/o c/c/e;  Neuro- intact..  IMP/PLAN>>  [name] is her own worst enemy- needs to quit all smoking & start an exercise program; we reviewed the specifics of these recommendations & outlined the benefits etc, she...     NOTE: >50% of this 135m rov was spent in counseling & coordination of care...   ~  May 08, 2016:  54m21moV  & pulmonary/ medical follow up visit>  [name] indicates to me that she has quit smoking & her last cig was 3 wks ago! She is hoping to remain quit but husb smokes; she notes some wheezing on & off esp at night & some SOB esp w/ exercise & hills; it appears that her Stiolto is not holding her & we discussed change to Symbicort160-2spBid; she is still taking the Klonopin 0.'5mg'$  bid but notes too groggy & again reminded to decr to 1/2 tab Bid; she's been exercising by walking & has a home gym but needs to incr the intensity of exercise, & consider weight watchers; we reviewed the following medical problems during today's office visit >>     Dyspnea> likely related to stress and "chest wall muscle spasm", improved on Klonopin0.'5mg'$ - pt asked to decr to 1/2 tab bid.    Hx Sarcoidosis in remission    Recurrent bronchitic infections    Ex-Cig smoker    PFTs w/ decr DLCO    Medical issues>  HL, GERD, Divertics/ IBS/ polyps & +FamHx colon Ca, Hx breast cancer on Arimidex, Hx anemia & Fe defic, Dysthymia on Wellbutrin & Klonopin... EXAM shows Afeb, VSS, O2sat=97% on RA;  Wt=223#, 5'5"Tall, BMI=37;  HEENT- neg, mallampati1;  Chest- clear w/o w/r/r;  Heart- RR w/o m/r/g;  Abd- soft, nontender, neg;  Ext- VI, tr edema no c/c;  Neuro- intact..   CXR 05/08/16 (independently reviewed by me in the PACS system)> norm heart size, atherosclerotic ao, mild hyperinflation & peribronchial thickening, otherw clear- NAD; clips in left breast area & osteopenia...   LABS 05/08/16>  ACE level = 29 IMP/PLAN>>  [name] quit smoking 3 wks ago & hopefully she can maintain (husb still smokes); she's noted some wheezing esp Qhs & the Stiolto is not holding her- we will switch to SYMBICORT160-2spBid;  She also c/o DOE w/ exercise & hills but she is overwt, hasn't really been on diet/ exercise program as we outlined for her & she understands  that further improvement requires a lot of hard work on her part!  She was concerned that her symptoms were  related to reactivation of her Sarcoid- but CXR & ACE remain normal & she is reassured;  Finally she notes that the [place] works well for her SOB/ anxiety but 0.'5mg'$  Bid makes her too groggy & rec to decr to 1/2 tab Bid as discussed... we plan rov in 56mo  ~  August 14, 2016:  382moOV & in March Pt had indicated that she quit smoking but was worried because husb still smokes in the house- she indicates that she has remained quit so far, breathing is improved using Symbicort80-2spBid & not using the Klonopin as often; she denies wheezing, DOE is improved, Klonopin still helps when needed;  For exercise she is doing yard work, walking, etc...  We reviewed the following interval Epic notes>      She saw Ophthalmology- DrHecker 04/12/16>  Glaucoma suspect- no progression, bilat early cats, dry eye syndrome, allergic conjunctivitis, myopia- wears contacts...     [facility]>  Note reviewed- c/o 1wk hx cramping/ nausea, diarrhea, hx hyst (no other surgeries), afeb w/ benign exam, Labs showed Hg=13.4, mcv=71, WBC=10.8, Chems- wnl, UA- clear, Lipase wnl;  CT Abd&Pelvis showed min divertics w/o inflamm, 2cm cyst left kidney, s/p hyst, mild Ao atherosclerosis, no explanation for her abd discomfort... Rec to take Omep20 & f/u w/ PCP...    She saw PCP- [name] 07/28/16>  Annual exam- said she had gastroenteritis which resolved, labs & CT reviewed, no change in her meds, advised to lose wt & try tumeric for pain... We reviewed the following medical problems during today's office visit>      Dyspnea> likely related to stress and "chest wall muscle spasm", improved on Klonopin0.'5mg'$ - taking 1/2 tab bid prn; note: she doesn't need both Klonopin & Xanax...    Hx Sarcoidosis in remission- last CXR 04/2016    Recurrent bronchitic infections    Ex-Cig smoker    PFTs w/ decr DLCO    Medical issues>  HL, GERD, Divertics/ IBS/ polyps & +FamHx colon Ca, Hx breast cancer on Arimidex, Hx anemia & Fe  defic, Dysthymia on Wellbutrin & Klonopin... EXAM shows Afeb, VSS, O2sat=98% on RA;  Wt=214#, 5'5"Tall, BMI=36;  HEENT- neg, mallampati1;  Chest- clear w/o w/r/r;  Heart- RR w/o m/r/g;  Abd- soft, nontender, neg;  Ext- VI, tr edema no c/c;  Neuro- intact..  IMP/PLAN>>  [name] is stable from the pulm standpoint- congrats for not smoking, keep up the good work w/ diet & exercise, call for any breathing problems...             Problem List:  PHYSICAL EXAMINATION (ICD-V70.0) - GYN= DrNeal who does her PAP smears, Mammograms (4/14- neg), and BMD... he also checked her VitD level and started VitD 50K weekly, then changed her to 2000 u daily... she is s/p hysterectomy in 2000... Note- she had an abd bruit investigated w/ an Abd MRA 6/08 and nothing found...  [facility]... Routine Mammogram 3/13 showed left breast lesion leading to surg by DrHoxworth, XRT by [name], Oncology f/u w/ DrJanice Norrie.   Hx of BRONCHITIS (ICD-490) - no recent episodes... Min cough & clear sputum... ~  Baseline CXR is clear and WNL (no residuals from her sarcoidosis) ~  Spirometry 7/06 showed FVC= 3.77 (130%), FEV1= 2.71 (113%), %1sec=72, mid-flows=66%... ~  CXR 11/12 showed  normal heart size, min scarring, otherw clear, NAD.Marland Kitchen.  ~  CXR 10/13 showed normal heart size, atherosclerotic calcif, clear lungs, surg clips within the left breast... ~  1/14: she presented w/ refractory cough/ AB- given Depo, Pred taper, & the usual OTC meds... ~  CXR 2/14 showed norm heart size, clear lungs, mild peribronch thickening, surg clips overlying left breast...  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 totally resolved on Pred therapy w/ normalization of CXR and no recurrence off Pred now since 1992... ~  CXR 9/10 remains clear & WNL.Marland Kitchen. ~  CXR 11/11 remains clear & WNL.Marland Kitchen. ~  CXR 11/12 remains clear, mild basilar atx,  otherw WNL... ~  EKG 11/12 showed NSR, rate62, wnl, NAD.Marland Kitchen. ~  CXR 10/13 showed normal heart size, atherosclerotic calcif, clear lungs, surg clips within the left breast... ~  CXR 2/14 showed norm heart size, clear lungs, mild peribronch thickening, surg clips overlying left breast... ~  CXR 4/15 showed norm heart size, clear lungs, no adenopathy, NAD... ~  PFT 4/15 showed FVC=3.49 (127%), FEV1=2.45 (112%), %1sec=70, mid-flows=61%predicted...  CIGARETTE SMOKER (ICD-305.1) - she was smoking 1/2 ppd> states she quit 10/12 & now back to 1/2-1ppd!!! ~  4/15: she admits to still smoking 1/4ppd & encouraged to quit completely...  VENOUS INSUFFICIENCY (ICD-459.81) - she follows a low salt diet, elevates when nec, and wears support hose Prn.  HYPERLIPIDEMIA (ICD-272.4) - hx of hyperlipidemia w/ high HDL's prev... ~  FLP 7/07 showed TChol 210, TG 56, HDL 81, LDL 101 ~  FLP 6/08 showed TChol 232, TG 88, HDL 83, LDL 112 ~  FLP 8/09 showed TChol 199, TG 85, HDL 68, LDL 115 ~  FLP 9/10 (wt=175#) showed TChol 231, TG 78, HDL 94, LDL 120 ~  FLP 11/11 (wt=197#) showed TChol 265, TG 93, HDL 89, LDL 143... needs better diet get wt down. ~  FLP 11/12 (wt=210#) showed TChol 238, TG 79, HDL 83, LDL 148... Offered meds but she wants to see how she does on her wt loss program. ~  FLP 3/13 (wt=229#) showed TChol 235, TG 89, HDL 85, LDL 130 ~  FLP 10/13 (wt=229#) showed TChol 214, TG 114, HDL 60, LDL 119  ~  FLP 4/14 (wt=224#) showed TChol 200, TG 100, HDL 65, LDL 115  GERD (ICD-530.81) - uses OTC Prilosec as needed.  DIVERTICULOSIS OF COLON (ICD-562.10) IRRITABLE BOWEL SYNDROME (ICD-564.1) COLONIC POLYPS (ICD-211.3) Family Hx of COLON CANCER (ICD-153.9) -  ~  colonoscopy 9/06 by Dorris Singh was WNL- no divertics, no polyps... f/u planned 56yrs. ~  Father was diagnosed w/ colon cancer in 2009 & being treated by oncology... pt will need colon f/u in 2011. ~  f/u colonoscopy 9/11 by [name] showed divertics & 2  polyps= polypoid mucosa, f/u planned 54yrs.  Hx of UTI (ICD-599.0)  BREAST CANCER >> Routine mammogram 3/13 showed a lesion on the left breast >> See above:  Surg- [name],  XRT- [name],  Oncology- [name]... 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 ~  1/14: she had f/u [facility]> stage 1 invasive ductal carcinoma, s/p lumpectomy & sentinel node bx (neg) 4/13, followed by XRT 6-8/13, w/ Arimidex51m/d starting 8/13; doing satis & no changes made... ~  2/14: she had ROV [name]> doing satis, no problems... ~  1/15: she had f/u [name]> note reviewed, she was c/o some CTS & hip pain she believed was due to the Arimidex, they decided to HOLD it for 6-8wks & consider other med (eg-Femara/Tamoxifen), she felt better off the med but ultimately went back on it...  ~  Mammogram & Sonar 4/15 were neg- no evid of malignancy w/ post op changes noted...  DEGENERATIVE JOINT DISEASE (ICD-715.90) - she has mild DJD esp in knees & prev had cortisone shots from ortho- DrMcKinley... not on regular meds, she uses Tylenol Prn... ~  Oncology reported a BMD done by her GYN 4/13 w/ TScore -1.1 in left FemNeck... ~  4/15: follow up BMD has been ordered by Oncology, but not yet performed...  VITAMIN D DEFICIENCY (ICD-268.9) - followed by DrNeal & currently taking Vit D 1000 u caps- 2 daily. ~  Labs here 11/12 showed Vit D  level = 37 ~  Labs 10/13 showed Vit D level = 26 ~  Labs 1/14 showed Vit D level = 44  HEADACHE (ICD-784.0)  Hx Panic Disorder> Improved w/o attacks in several yrs; she has ALPRAZOLAM0.549mfor prn use but seldom needs it she says... Hx of DEPRESSION (ICD-311) - husb passed away in 2003-04-2003. she sees DrMcKinney (MGafferon [facility] 10086m...  Hx of ANEMIA (ICD-285.9) - Hg chronically in the 11-12 range... ~  labs 9/10 showed Hg= 11.9, MCV= 77, Fe= 76 (21%sat) ~  labs 11/11 showed Hg= 12.0, MCV= 76, Fe= 80 (20%sat) ~  Labs 11/12 showed Hg= 12.1, MCV= 75, Fe=68 (19%sat) ~  Labs 4/13 showed Hg= 11.2 - 12.6 ~  Labs 10/13 showed Hg= 12.2, MCV= 75, Rec to start Fe daily... ~  Labs 4/14 showed Hg= 12.4, MCV= 75, & rec to restart the FeSO4 daily...   Past Surgical History:  Procedure Laterality Date  . BIOPSY BREAST     left breast  as a teenager  benign  . BREAST BIOPSY Left 06/06/2011  . BREAST EXCISIONAL BIOPSY Bilateral   . BREAST LUMPECTOMY Left 20103-Mar-2013 BREAST SURGERY  07/05/11   left breast lumpectomy with needle loc & axillary sln bx  . BUNIONECTOMY  2012011/03/03bilateral by Dr TucLittle Ishikawa COLONOSCOPY     polyp  . cyst removed from right hand  1995  . TONSILLECTOMY  1972  . TOTAL ABDOMINAL HYSTERECTOMY  20002-Mar-2000Dr NeaNori Riis TRIGGER FINGER RELEASE  20003-02-2008Dr WeiBurney Gauze Outpatient Encounter Prescriptions as of 08/14/2016  Medication Sig  . ALPRAZolam (XANAX) 0.5 MG tablet Take 1/2 to 1 tablet by mouth three times daily as needed for anxiety  . anastrozole (ARIMIDEX) 1 MG tablet Take 1 tablet (1 mg total) by mouth daily.  . budesonide-formoterol (SYMBICORT) 80-4.5 MCG/ACT inhaler Inhale 2 puffs into the lungs 2 (two) times daily.  . bMarland KitchenPROPion (WELLBUTRIN XL) 150 MG 24 hr tablet TAKE 1 TABLET (150 MG TOTAL) BY MOUTH DAILY.  . CMarland Kitchenolecalciferol 5000 UNITS TABS Take 1 tablet by mouth 3 (three)  times a week.  . clobetasol cream (TEMOVATE) 4.27 % Apply 1 application topically 2 (two)  times daily.  . clonazePAM (KLONOPIN) 0.5 MG tablet Take 1 tablet (0.5 mg total) by mouth 2 (two) times daily as needed for anxiety.  . Multiple Vitamin (MULTIVITAMIN WITH MINERALS) TABS tablet Take 1 tablet by mouth daily.  . [DISCONTINUED] estradiol (ESTRACE) 0.1 MG/GM vaginal cream Use 1-2 times per week  . [DISCONTINUED] omeprazole (PRILOSEC) 20 MG capsule Take 1 capsule (20 mg total) by mouth daily. (Patient not taking: Reported on 08/14/2016)   No facility-administered encounter medications on file as of 08/14/2016.     Allergies  Allergen Reactions  . Penicillins Rash and Other (See Comments)    At injection site.   Immunization History  Administered Date(s) Administered  . Influenza Split 11/28/2010, 11/28/2011, 11/25/2012  . Influenza Whole 11/26/2009  . Influenza,inj,Quad PF,36+ Mos 11/10/2013, 11/24/2014, 11/09/2015  . Td 10/12/2008    Current Medications, Allergies, Past Medical History, Past Surgical History, Family History, and Social History were reviewed in [employer] record.    Review of Systems    The patient notes some DOE- improved from last yr.  She denies fever, chills, sweats, anorexia, fatigue, weakness, malaise, weight loss, sleep disorder, blurring, diplopia, eye irritation, eye discharge, vision loss, eye pain, photophobia, earache, ear discharge, tinnitus, decreased hearing, nasal congestion, nosebleeds, sore throat, hoarseness, chest pain, palpitations, syncope, orthopnea, PND, peripheral edema, cough, dyspnea at rest, excessive sputum, hemoptysis, wheezing, pleurisy, nausea, vomiting, diarrhea, constipation, change in bowel habits, abdominal pain, melena, hematochezia, jaundice, gas/bloating, indigestion/heartburn, dysphagia, odynophagia, dysuria, hematuria, urinary frequency, urinary hesitancy, nocturia, incontinence, back pain, joint pain, joint swelling, muscle cramps, muscle weakness, stiffness, arthritis, sciatica, restless legs, leg  pain at night, leg pain with exertion, rash, itching, dryness, suspicious lesions, paralysis, paresthesias, seizures, tremors, vertigo, transient blindness, frequent falls, frequent headaches, difficulty walking, depression, anxiety, memory loss, confusion, cold intolerance, heat intolerance, polydipsia, polyphagia, polyuria, unusual weight change, abnormal bruising, bleeding, enlarged lymph nodes, urticaria, allergic rash, hay fever, and recurrent infections.     Objective:   Physical Exam     WD, overweight, 61 y/o BF in NAD... GENERAL:  Alert & oriented; pleasant & cooperative... HEENT:  [place]/AT, EOM-wnl, PERRLA, EACs-clear, TMs-wnl, NOSE-clear, THROAT-clear & wnl. NECK:  Supple w/ full ROM; no JVD; normal carotid impulses w/o bruits; no thyromegaly or nodules palpated; no lymphadenopathy. CHEST:  Clear to P & A; w/o wheezing, rales, or rhonchi heard... HEART:  Regular Rhythm; without murmurs/ rubs/ or gallops. ABDOMEN:  Soft & nontender; normal bowel sounds; no organomegaly or masses detected... EXT: without deformities, mild arthritic changes; no varicose veins/ venous insuffic/ or edema. NEURO:  CN's intact; motor testing normal; sensory testing normal; gait normal & balance OK. DERM:  No lesions noted; no rash etc...  RADIOLOGY DATA:  Reviewed in the EPIC EMR & discussed w/ the patient...  LABORATORY DATA:  Reviewed in the EPIC EMR & discussed w/ the patient...   Assessment & Plan:    Smoker, Hx Bronchits, Hx Sarcoid>  Hx of recurrent AB w/ refractory cough, congestion; she's had ZPak, Depo, Pred taper, Mucinex, Fluids & resolved; Sarcoid in remission, she quit smoking 07/2015... 09/27/15>   [name] has been a modest smoker & quit 79moago; her CXR report indicates sl hyperinflation (& clear) but her PFT again does not reveal much airflow obstruction, lung volumes show some mild air trapping & curiously her DLCO is mod reduced;  She has a remote hx  of Sarcoidosis but at that time she had a  very prominent CXR pattern of "alveolar sarcoid" which responded nicely to Pred & a slow taper;  We discussed proceeding w/ Hi-res CT Chest for further eval vs trial of KLONOPIN as a combination relaxer for her symptoms of SOB w/ short term follow up to assertain it's effect on her breathing;  She would like to try the latter & we will plan recheck in ~6wks. 11/09/15>   She is improved on the Klonopin but 0.'5mg'$  is too strong- advised to decr to 1/2 tab bid, NO SMOKING, & ROV 32mo..  03/14/15>   She is now tolerating the Klonopin 0.'5mg'$  bid, must quit all smoking 7 incr her exercise program- discussed in detail... 05/08/16>   [name] quit smoking 3 wks ago & hopefully she can maintain (husb still smokes); she's noted some wheezing esp Qhs & the Stiolto is not holding her- we will switch to [id number];  She also c/o DOE w/ exercise & hills but she is overwt, hasn't really been on diet/ exercise program as we outlined for her & she understands that further improvement requires a lot of hard work on her part!  She was concerned that her symptoms were related to reactivation of her Sarcoid- but CXR & ACE remain normal & she is reassured;  Finally she notes that the KBelgradeworks well for her SOB/ anxiety but 0.'5mg'$  Bid makes her too groggy & rec to decr to 1/2 tab Bid as discussed. 08/14/16>   [name] is stable from the pulm standpoint- congrats for not smoking, keep up the good work w/ diet & exercise, call for any breathing problems.   Ven Insuffic>  Aware- avoid sodium, elevate, support hose...  Hyperlipid>  She doesn't want meds but weight is up substantially & needs to get wt down...  GI> GERD, Divertics, IBS, Polyps>  Stable on Rx, followed by [name] & up to date...  BREAST CANCER> followed by DJanice Norrieon Arimidex...  DJD>  She uses OTC analgesics as needed; notes incr symptoms w/ wt gain & hoping for improvement on diet plan...  Hx Depression>  She was followed by psychiatrist prev on Zoloft & on  prn Alpraz which she seldom takes + Wellbutrin started by [employer]  Hx Anemia>  Hg stable ~12 w/ sm cells & Fe levels wnl; poss Thalassemia minor, it still wouldn't hurt to take Fe daily supplement...   Patient's Medications  New Prescriptions   No medications on file  Previous Medications   ALPRAZOLAM (XANAX) 0.5 MG TABLET    Take 1/2 to 1 tablet by mouth three times daily as needed for anxiety   ANASTROZOLE (ARIMIDEX) 1 MG TABLET    Take 1 tablet (1 mg total) by mouth daily.   BUDESONIDE-FORMOTEROL (SYMBICORT) 80-4.5 MCG/ACT INHALER    Inhale 2 puffs into the lungs 2 (two) times daily.   BUPROPION (WELLBUTRIN XL) 150 MG 24 HR TABLET    TAKE 1 TABLET (150 MG TOTAL) BY MOUTH DAILY.   CHOLECALCIFEROL 5000 UNITS TABS    Take 1 tablet by mouth 3 (three) times a week.   CLOBETASOL CREAM (TEMOVATE) 0.05 %    Apply 1 application topically 2 (two) times daily.   CLONAZEPAM (KLONOPIN) 0.5 MG TABLET    Take 1 tablet (0.5 mg total) by mouth 2 (two) times daily as needed for anxiety.   MULTIPLE VITAMIN (MULTIVITAMIN WITH MINERALS) TABS TABLET    Take 1 tablet by mouth daily.  Modified Medications   No  medications on file  Discontinued Medications   ESTRADIOL (ESTRACE) 0.1 MG/GM VAGINAL CREAM    Use 1-2 times per week   OMEPRAZOLE (PRILOSEC) 20 MG CAPSULE    Take 1 capsule (20 mg total) by mouth daily.

## 2016-08-14 NOTE — Patient Instructions (Signed)
Today we updated your med list in our EPIC system...    Continue your current medications the same...  Keep up the great work w/ diet & exercise!  Call for any questions...  Let's plan a follow up visit in 64mo, sooner if needed for problems.Marland KitchenMarland Kitchen

## 2016-09-11 ENCOUNTER — Ambulatory Visit: Payer: No Typology Code available for payment source | Admitting: Pulmonary Disease

## 2016-09-20 ENCOUNTER — Ambulatory Visit (INDEPENDENT_AMBULATORY_CARE_PROVIDER_SITE_OTHER): Payer: No Typology Code available for payment source | Admitting: Family Medicine

## 2016-09-20 ENCOUNTER — Encounter: Payer: Self-pay | Admitting: Family Medicine

## 2016-09-20 VITALS — BP 140/80 | HR 78 | Temp 97.9°F | Ht 62.25 in | Wt 215.0 lb

## 2016-09-20 DIAGNOSIS — J4520 Mild intermittent asthma, uncomplicated: Secondary | ICD-10-CM

## 2016-09-20 DIAGNOSIS — F410 Panic disorder [episodic paroxysmal anxiety] without agoraphobia: Secondary | ICD-10-CM | POA: Diagnosis not present

## 2016-09-20 MED ORDER — BUSPIRONE HCL 10 MG PO TABS: 10.0000 mg | ORAL_TABLET | Freq: Two times a day (BID) | ORAL | 0 refills | Status: DC

## 2016-09-20 MED ORDER — BUPROPION HCL ER (XL) 150 MG PO TB24: ORAL_TABLET | ORAL | 0 refills | Status: DC

## 2016-09-20 NOTE — Assessment & Plan Note (Addendum)
Reports that the Wellbutrin did not work well for her but buspirone has an the past. He is a good understanding of her panic. She feels that getting back on the buspirone will help her with this. - Buspirone 10 mg twice a day. Advised follow-up with myself or her primary doctor in 2 weeks to monitor. - Wellbutrin was sent to her pharmacy but she reports this was not helpful for her panic attacks. This will be discontinued.

## 2016-09-20 NOTE — Assessment & Plan Note (Signed)
Discussed with her about the different treatment options that she can perform for having any acute shortness of breath.

## 2016-09-20 NOTE — Patient Instructions (Signed)
Thank you for coming in,   Please follow-up with me or her primary doctor after initiating this medication.    Please feel free to call with any questions or concerns at any time, at [phone]. --Dr. Raeford Razor

## 2016-09-20 NOTE — Progress Notes (Signed)
Marilyn [name] - 61 y.o. female MRN [id number]  Date of birth: 03/10/1955  SUBJECTIVE:  Including CC & ROS.  Chief Complaint  Patient presents with  . COPD    patient want to talk about getting a rescue inhaler for her COPD  . Panic Attack    having more frequent panic attacks starting last month gets a bad episode about once a week, she is taking care of her parents which is causing her stress    Marilyn [name] is a 61 year old female is presenting with anxiety, panic attacks, and questions about COPD. She reports she has history of anxiety and panic attacks since the 70s. She has been treated with different medications. She last of Wellbutrin over a year ago. She reports she takes Xanax 1-2 times per week to help with her panic attacks. She has taken buspirone which seemed to help most with her panic attacks. She has taken Zoloft was minimal improvement. She feels that her panic attacks are becoming worse that she has less control over them. She denies any SI or HI.  She has some questions about COPD in which medication she can use a short acting in nature. She does take clonazepam to help with her breathing when she has some chest tightness. She denies any cough, sputum production or fever.     Review of Systems  Constitutional: Negative for chills and fever.  Psychiatric/Behavioral: Negative for self-injury and suicidal ideas. The patient is nervous/anxious.    otherwise negative  HISTORY: Past Medical, Surgical, Social, and Family History Reviewed & Updated per EMR.   Pertinent Historical Findings include:  Past Medical History:  Diagnosis Date  . Allergy   . Anemia    hx  . Anxiety    panic disorder  . 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 Cancer ([place])    BREAST - left  . Cigarette smoker   . Colon polyp   . Complication of anesthesia    DIFFICULTY AWAKENING  . Depression   . Diverticulosis of colon   . DJD  (degenerative joint disease)   . DJD (degenerative joint disease)   . GERD (gastroesophageal reflux disease)    Pt. denies having GERD. Unable to remove it.  Marland Kitchen Headache(784.0)   . History of anemia   . History of bronchitis   . History of sarcoidosis   . Hyperlipidemia    no meds needed  . Incisional breast wound    AT AGE 43 BILATERAL INCISION TO BREAST MADE  . Leg swelling   . Panic disorder   . Personal history of radiation therapy   . S/P radiation therapy 08/17/11 - 09/28/11   LLQ - 50 Gy/25 Fractions with Boost of 10 Gy / 5 fractions  . Sialoadenitis of submandibular gland 2016  . Vitamin D deficiency     Past Surgical History:  Procedure Laterality Date  . BIOPSY BREAST     left breast  as a teenager  benign  . BREAST BIOPSY Left 06/06/2011  . BREAST EXCISIONAL BIOPSY Bilateral   . BREAST LUMPECTOMY Left 2013  . BREAST SURGERY  07/05/11   left breast lumpectomy with needle loc & axillary sln bx  . BUNIONECTOMY  2011   bilateral by Dr Little Ishikawa  . COLONOSCOPY     polyp  . cyst removed from right hand  1995  . TONSILLECTOMY  1972  . TOTAL ABDOMINAL HYSTERECTOMY  2000   Dr  Gae Gallop FINGER RELEASE  Jun 15, 2006   Dr Burney Gauze    Allergies  Allergen Reactions  . Penicillins Rash and Other (See Comments)    At injection site.    Family History  Problem Relation Age of Onset  . Anemia Mother   . Other Mother        + H Pylori  . Prostate cancer Father   . Colon cancer Father        diagnosed Jun 15, 2007  . Cancer Father        colon and prostate cancer  . Breast cancer Sister        #1  . Cancer Sister 30       breast cancer, lung, pancreatic  . Sarcoidosis Sister        #2  . Multiple sclerosis Sister        #2  . Hypertension Brother        #1  . Cancer Brother        prostate cancer  . Prostate cancer Brother        #1  . Hyperlipidemia Brother        #1  . Cancer Cousin        breast cancer  . Esophageal cancer Neg Hx   . Stomach cancer Neg Hx   .  Rectal cancer Neg Hx      Social History   Social History  . Marital status: Married    Spouse name: N/A  . Number of children: 2  . Years of education: N/A   Occupational History  . Tour manager    Social History Main Topics  . Smoking status: Current Every Day Smoker    Packs/day: 0.25    Types: Cigarettes    Last attempt to quit: 04/16/2012  . Smokeless tobacco: Never Used     Comment: trying to quit with wellbutrin  . Alcohol use 0.0 oz/week     Comment: social  . Drug use: No  . Sexual activity: Not Currently    Birth control/ protection: Surgical     Comment: menses age 78,hrt started  06-15-1998   Other Topics Concern  . Not on file   Social History Narrative   Widowed - husband died 06/15/03   2 step-children   Exercises 3x per week   2 cups of caffeine daily           PHYSICAL EXAM:  VS: BP 140/80 (BP Location: Right Arm, Patient Position: Sitting, Cuff Size: Normal)   Pulse 78   Temp 97.9 F (36.6 C) (Oral)   Ht 5' 2.25" (1.581 m)   Wt 215 lb (97.5 kg)   SpO2 100%   BMI 39.01 kg/m  PHYSICAL EXAM: Gen: NAD, alert, cooperative with exam,  ENT: normal lips, normal nasal mucosa,  Eye: PERRL, normal conjunctiva and lids CV:  no edema, +2 pedal pulses   Resp: no accessory muscle use, non-labored,   Skin: no rashes, no areas of induration  Psych:  normal insight, alert and oriented MSK: normal gait, no digit swelling    ASSESSMENT & PLAN:   PANIC DISORDER Reports that the Wellbutrin did not work well for her but buspirone has an the past. He is a good understanding of her panic. She feels that getting back on the buspirone will help her with this. - Buspirone 10 mg twice a day. Advised follow-up with myself or her primary doctor in 2 weeks to monitor. - Wellbutrin was sent to  her pharmacy but she reports this was not helpful for her panic attacks. This will be discontinued.  AB (asthmatic bronchitis) Discussed with her about the different treatment  options that she can perform for having any acute shortness of breath.  Time was spent with 25 minutes face-to-face with greater than 50% of that time counseling

## 2016-10-20 ENCOUNTER — Ambulatory Visit (INDEPENDENT_AMBULATORY_CARE_PROVIDER_SITE_OTHER): Payer: No Typology Code available for payment source | Admitting: Internal Medicine

## 2016-10-20 ENCOUNTER — Encounter: Payer: Self-pay | Admitting: Internal Medicine

## 2016-10-20 VITALS — BP 128/80 | HR 77 | Temp 98.5°F | Ht 62.25 in | Wt 215.0 lb

## 2016-10-20 DIAGNOSIS — Z23 Encounter for immunization: Secondary | ICD-10-CM

## 2016-10-20 DIAGNOSIS — F410 Panic disorder [episodic paroxysmal anxiety] without agoraphobia: Secondary | ICD-10-CM

## 2016-10-20 DIAGNOSIS — F419 Anxiety disorder, unspecified: Secondary | ICD-10-CM | POA: Diagnosis not present

## 2016-10-20 DIAGNOSIS — E559 Vitamin D deficiency, unspecified: Secondary | ICD-10-CM | POA: Diagnosis not present

## 2016-10-20 MED ORDER — CLONAZEPAM 0.5 MG PO TABS: 0.5000 mg | ORAL_TABLET | Freq: Two times a day (BID) | ORAL | 5 refills | Status: DC | PRN

## 2016-10-20 MED ORDER — ALPRAZOLAM 0.5 MG PO TABS: ORAL_TABLET | ORAL | 0 refills | Status: DC

## 2016-10-20 MED ORDER — BUSPIRONE HCL 10 MG PO TABS: 10.0000 mg | ORAL_TABLET | Freq: Two times a day (BID) | ORAL | 2 refills | Status: DC

## 2016-10-20 NOTE — Addendum Note (Signed)
Addended by: Karren Cobble on: 10/20/2016 11:52 AM   Modules accepted: Orders

## 2016-10-20 NOTE — Assessment & Plan Note (Signed)
On Buspar, bupropion-d/c Xanax prn: Take 1/2 to 1 tablet by mouth three times daily as needed for panic attacks only Clonazepam was given by Dr Vaughan Browner for anxiety/breathing

## 2016-10-20 NOTE — Progress Notes (Signed)
Subjective:  Patient ID: Marilyn [name], female    DOB: 1955-11-24  Age: 61 y.o. MRN: [id number]  CC: No chief complaint on file.   HPI Marilyn [name] presents for panic attacks - worse, anxiety - chronic Stress at home w/mom  Outpatient Medications Prior to Visit  Medication Sig Dispense Refill  . ALPRAZolam (XANAX) 0.5 MG tablet Take 1/2 to 1 tablet by mouth three times daily as needed for anxiety 90 tablet 2  . anastrozole (ARIMIDEX) 1 MG tablet Take 1 tablet (1 mg total) by mouth daily. 90 tablet 3  . budesonide-formoterol (SYMBICORT) 80-4.5 MCG/ACT inhaler Inhale 2 puffs into the lungs 2 (two) times daily. 1 Inhaler 11  . busPIRone (BUSPAR) 10 MG tablet Take 1 tablet (10 mg total) by mouth 2 (two) times daily. 60 tablet 0  . Cholecalciferol 5000 UNITS TABS Take 1 tablet by mouth 3 (three) times a week.    . clobetasol cream (TEMOVATE) 4.80 % Apply 1 application topically 2 (two) times daily. 60 g 3  . clonazePAM (KLONOPIN) 0.5 MG tablet Take 1 tablet (0.5 mg total) by mouth 2 (two) times daily as needed for anxiety. 60 tablet 5  . Multiple Vitamin (MULTIVITAMIN WITH MINERALS) TABS tablet Take 1 tablet by mouth daily.    Marland Kitchen buPROPion (WELLBUTRIN XL) 150 MG 24 hr tablet TAKE 1 TABLET (150 MG TOTAL) BY MOUTH DAILY. 30 tablet 0   No facility-administered medications prior to visit.     ROS Review of Systems  Constitutional: Negative for activity change, appetite change, chills, fatigue and unexpected weight change.  HENT: Negative for congestion, mouth sores and sinus pressure.   Eyes: Negative for visual disturbance.  Respiratory: Negative for cough and chest tightness.   Gastrointestinal: Negative for abdominal pain and nausea.  Genitourinary: Negative for difficulty urinating, frequency and vaginal pain.  Musculoskeletal: Negative for back pain and gait problem.  Skin: Negative for pallor and rash.  Neurological: Negative for dizziness, tremors, weakness, numbness and  headaches.  Psychiatric/Behavioral: Positive for dysphoric mood. Negative for confusion, sleep disturbance and suicidal ideas. The patient is nervous/anxious.     Objective:  BP 128/80 (BP Location: Right Arm, Patient Position: Sitting, Cuff Size: Large)   Pulse 77   Temp 98.5 F (36.9 C) (Oral)   Ht 5' 2.25" (1.581 m)   Wt 215 lb (97.5 kg)   SpO2 99%   BMI 39.01 kg/m   BP Readings from Last 3 Encounters:  10/20/16 128/80  09/20/16 140/80  08/14/16 130/74    Wt Readings from Last 3 Encounters:  10/20/16 215 lb (97.5 kg)  09/20/16 215 lb (97.5 kg)  08/14/16 214 lb 4 oz (97.2 kg)    Physical Exam  Constitutional: She appears well-developed. No distress.  HENT:  Head: Normocephalic.  Right Ear: External ear normal.  Left Ear: External ear normal.  Nose: Nose normal.  Mouth/Throat: Oropharynx is clear and moist.  Eyes: Pupils are equal, round, and reactive to light. Conjunctivae are normal. Right eye exhibits no discharge. Left eye exhibits no discharge.  Neck: Normal range of motion. Neck supple. No JVD present. No tracheal deviation present. No thyromegaly present.  Cardiovascular: Normal rate, regular rhythm and normal heart sounds.   Pulmonary/Chest: No stridor. No respiratory distress. She has no wheezes.  Abdominal: Soft. Bowel sounds are normal. She exhibits no distension and no mass. There is no tenderness. There is no rebound and no guarding.  Musculoskeletal: She exhibits no edema or tenderness.  Lymphadenopathy:  She has no cervical adenopathy.  Neurological: She displays normal reflexes. No cranial nerve deficit. She exhibits normal muscle tone. Coordination normal.  Skin: No rash noted. No erythema.  Psychiatric: She has a normal mood and affect. Her behavior is normal. Judgment and thought content normal.  obese   Lab Results  Component Value Date   WBC 10.8 (H) 07/14/2016   HGB 13.4 07/14/2016   HCT 39.9 07/14/2016   PLT 236 07/14/2016   GLUCOSE 95  08/14/2016   CHOL 210 (H) 08/14/2016   TRIG 85.0 08/14/2016   HDL 62.10 08/14/2016   LDLDIRECT 118.9 12/18/2011   LDLCALC 131 (H) 08/14/2016   ALT 12 08/14/2016   AST 13 08/14/2016   NA 140 08/14/2016   K 3.6 08/14/2016   CL 107 08/14/2016   CREATININE 0.73 08/14/2016   BUN 14 08/14/2016   CO2 26 08/14/2016   TSH 0.87 08/14/2016    Ct Abdomen Pelvis W Contrast  Result Date: 07/15/2016 CLINICAL DATA:  Abdominal pain and diarrhea.  Nausea. EXAM: CT ABDOMEN AND PELVIS WITH CONTRAST TECHNIQUE: Multidetector CT imaging of the abdomen and pelvis was performed using the standard protocol following bolus administration of intravenous contrast. CONTRAST:  164mL ISOVUE-300 IOPAMIDOL (ISOVUE-300) INJECTION 61% COMPARISON:  None. FINDINGS: Lower chest: Linear atelectasis in the lower lobes. No consolidation or pleural fluid. Hepatobiliary: Minimal focal fatty infiltration adjacent with falciform ligament. No focal lesion. Gallbladder is decompressed, no calcified gallstone. No biliary dilatation. Pancreas: No ductal dilatation or inflammation. Spleen: Normal in size without focal abnormality. Adrenals/Urinary Tract: Normal adrenal glands. No hydronephrosis or perinephric edema. Simple cysts in the upper left kidney measures 2.2 cm. Small cortical hypodensity in the lower left kidney is too small to characterize. Ureters are decompressed. Urinary bladder is physiologically distended. Stomach/Bowel: Stomach physiologically distended. No small bowel inflammation, obstruction or wall thickening. Normal appendix. Formed stool throughout the colon without colonic wall thickening. No pericolonic inflammation. The scattered colonic diverticulosis in the descending and sigmoid colon without acute diverticulitis. Vascular/Lymphatic: Mild aortic atherosclerosis without aneurysm. No adenopathy. Reproductive: Status post hysterectomy. No adnexal masses. Other: No free air, free fluid, or intra-abdominal fluid collection.  Tiny fat containing umbilical hernia. Musculoskeletal: There are no acute or suspicious osseous abnormalities. Degenerative disc disease at L4-L5 with vacuum phenomenon. Bone island within L5. IMPRESSION: 1. No explanation for abdominal pain and diarrhea. 2. Minimal colonic diverticulosis without acute diverticulitis. 3. Mild aortic atherosclerosis. Electronically Signed   By: Jeb Levering M.D.   On: 07/15/2016 00:47    Assessment & Plan:   There are no diagnoses linked to this encounter. I have discontinued Ms. Agostini's buPROPion. I am also having her maintain her multivitamin with minerals, Cholecalciferol, anastrozole, clonazePAM, budesonide-formoterol, clobetasol cream, ALPRAZolam, and busPIRone.  No orders of the defined types were placed in this encounter.    Follow-up: No Follow-up on file.  Walker Kehr, MD

## 2016-10-20 NOTE — Assessment & Plan Note (Signed)
Vit d 

## 2016-11-04 ENCOUNTER — Other Ambulatory Visit: Payer: Self-pay | Admitting: Pulmonary Disease

## 2016-11-09 ENCOUNTER — Telehealth: Payer: Self-pay | Admitting: Pulmonary Disease

## 2016-11-09 ENCOUNTER — Telehealth: Payer: Self-pay | Admitting: Internal Medicine

## 2016-11-09 NOTE — Telephone Encounter (Signed)
clonazePAM (KLONOPIN) 0.5 MG tablet [[id number]]  Order Details  Dose: 0.5 mg Route: Oral Frequency: 2 times daily PRN for anxiety  Dispense Quantity:  60 tablet Refills:  5 Fills remaining:  --        Sig: Take 1 tablet (0.5 mg total) by mouth 2 (two) times daily as needed for anxiety.       Written Date:  10/20/16 Expiration Date:  04/18/17    Start Date:  10/20/16 End Date:  --         Ordering Provider:  -- DEA #:  -- NPI:  --   Authorizing Provider:  Cassandria Anger, MD DEA #:  [id number] NPI:  [id number]   Ordering User:  Cassandria Anger, MD            Original Order:  clonazePAM Bobbye [place]) 0.5 MG tablet [[id number]]    Pharmacy:  [id number] - , [place] [address] DEA #:  [id number]    Pharmacy Comments:  --       Fill quantity remaining:  -- Fill quantity used:  --       Dr. Alain Marion wrote this Rx for pt. I called pt to let her know to call his office so his nurse can call it in since I am unable to do so. Pt undertsood.

## 2016-11-09 NOTE — Telephone Encounter (Signed)
Marilyn [name] this medication in

## 2016-11-09 NOTE — Telephone Encounter (Signed)
Patient requesting last clonazepam script to be called into her pharmacy for refill.  Patient states she did not know [facility] had sent this script in b/c it is typically filled by [facility].  Please see phone note from [place] office dated for 9/13.  [facility] [address]  Please follow up in regard with patient.

## 2016-11-09 NOTE — Telephone Encounter (Signed)
Xanax is med that was called in---patient is asking about clonopin 0.5 mg tablet ---dr Alain Marion approved this med on 10/20/16 but hit "no print"---checked [place] registry, appropriate refill is in place---I have [facility] rx [facility]--patient advised

## 2016-11-09 NOTE — Telephone Encounter (Signed)
Can you call this in since I am not at your office. Thank you!

## 2016-11-09 NOTE — Telephone Encounter (Signed)
rq to fill clonazepam

## 2016-11-09 NOTE — Telephone Encounter (Signed)
OK to fill this/these prescription(s) with additional refills x2 Thank you!  

## 2016-11-09 NOTE — Telephone Encounter (Signed)
Reviewed chart pt MD ok rx on 10/20/16, but rx was never sent because he miss and hit no print. Check  registry last filled 09/13/2016. [facility] spoke w/Joselyn gave MD approval../lmb

## 2016-11-23 ENCOUNTER — Ambulatory Visit (HOSPITAL_BASED_OUTPATIENT_CLINIC_OR_DEPARTMENT_OTHER): Payer: No Typology Code available for payment source | Admitting: Hematology and Oncology

## 2016-11-23 ENCOUNTER — Telehealth: Payer: Self-pay | Admitting: Hematology and Oncology

## 2016-11-23 DIAGNOSIS — M8580 Other specified disorders of bone density and structure, unspecified site: Secondary | ICD-10-CM | POA: Diagnosis not present

## 2016-11-23 DIAGNOSIS — Z79811 Long term (current) use of aromatase inhibitors: Secondary | ICD-10-CM

## 2016-11-23 DIAGNOSIS — Z170 Estrogen receptor positive status [ER+]: Secondary | ICD-10-CM | POA: Diagnosis not present

## 2016-11-23 DIAGNOSIS — C50412 Malignant neoplasm of upper-outer quadrant of left female breast: Secondary | ICD-10-CM | POA: Diagnosis not present

## 2016-11-23 NOTE — Progress Notes (Signed)
Patient Care Team: Plotnikov, Evie Lacks, MD as PCP - General (Internal Medicine) Jesusita Oka, RN as Registered Nurse Inda Castle, MD as Consulting Physician (Gastroenterology) Maisie Fus, MD as Consulting Physician (Obstetrics and Gynecology)  DIAGNOSIS:  Encounter Diagnosis  Name Primary?  . Malignant neoplasm of upper-outer quadrant of left breast in female, estrogen receptor positive ([place])     SUMMARY OF ONCOLOGIC HISTORY:   Malignant neoplasm of upper-outer quadrant of left breast in female, estrogen receptor positive ([place])   06/20/2011 Surgery    Left breast lumpectomy with sentinel lymph node biopsy: 1 cm IDC, grade 1, ER positive, PR positive, HER-2 negative, Ki-67 6%      08/17/2011 - 09/28/2011 Radiation Therapy    Adjuvant radiation therapy      10/02/2011 - 11/23/2016 Anti-estrogen oral therapy    Arimidex 1 mg daily started 10/02/2011 stopped March 2015 for pain and restarted August 2015       CHIEF COMPLIANT: Surveillance of breast cancer on anastrozole therapy  INTERVAL HISTORY: Marilyn [name] is a 61 year old with above-mentioned history of left breast cancer who is currently on anastrozole therapy. She initially had lots of problems red but later was able to tolerate it fairly well. She did however have swelling of her fingers is reluctant this medicine. She also has some posterior chest wall tightness and discomfort and sharp pains intermittently.  REVIEW OF SYSTEMS:   Constitutional: Denies fevers, chills or abnormal weight loss Eyes: Denies blurriness of vision Ears, nose, mouth, throat, and face: Denies mucositis or sore throat Respiratory: Denies cough, dyspnea or wheezes Cardiovascular: Denies palpitation, chest discomfort Gastrointestinal:  Denies nausea, heartburn or change in bowel habits Skin: Denies abnormal skin rashes Lymphatics: Denies new lymphadenopathy or easy bruising Neurological:Denies numbness, tingling or new  weaknesses Behavioral/Psych: Mood is stable, no new changes  Extremities: No lower extremity edema Breast: intermittent left breast discomfort All other systems were reviewed with the patient and are negative.  I have reviewed the past medical history, past surgical history, social history and family history with the patient and they are unchanged from previous note.  ALLERGIES:  is allergic to penicillins.  MEDICATIONS:  Current Outpatient Prescriptions  Medication Sig Dispense Refill  . ALPRAZolam (XANAX) 0.5 MG tablet Take 1/2 to 1 tablet by mouth three times daily as needed for panic attacks only 20 tablet 0  . anastrozole (ARIMIDEX) 1 MG tablet Take 1 tablet (1 mg total) by mouth daily. 90 tablet 3  . budesonide-formoterol (SYMBICORT) 80-4.5 MCG/ACT inhaler Inhale 2 puffs into the lungs 2 (two) times daily. 1 Inhaler 11  . busPIRone (BUSPAR) 10 MG tablet Take 1 tablet (10 mg total) by mouth 2 (two) times daily. 180 tablet 2  . Cholecalciferol 5000 UNITS TABS Take 1 tablet by mouth 3 (three) times a week.    . clobetasol cream (TEMOVATE) 1.32 % Apply 1 application topically 2 (two) times daily. 60 g 3  . clonazePAM (KLONOPIN) 0.5 MG tablet Take 1 tablet (0.5 mg total) by mouth 2 (two) times daily as needed for anxiety. 60 tablet 5  . Multiple Vitamin (MULTIVITAMIN WITH MINERALS) TABS tablet Take 1 tablet by mouth daily.     No current facility-administered medications for this visit.     PHYSICAL EXAMINATION: ECOG PERFORMANCE STATUS: 1 - Symptomatic but completely ambulatory  Vitals:   11/23/16 1010  BP: (!) 149/87  Pulse: 90  Resp: 18  Temp: (!) 97.5 F (36.4 C)  SpO2: 95%  Filed Weights   11/23/16 1010  Weight: 217 lb 3.2 oz (98.5 kg)    GENERAL:alert, no distress and comfortable SKIN: skin color, texture, turgor are normal, no rashes or significant lesions EYES: normal, Conjunctiva are pink and non-injected, sclera clear OROPHARYNX:no exudate, no erythema and  lips, buccal mucosa, and tongue normal  NECK: supple, thyroid normal size, non-tender, without nodularity LYMPH:  no palpable lymphadenopathy in the cervical, axillary or inguinal LUNGS: clear to auscultation and percussion with normal breathing effort HEART: regular rate & rhythm and no murmurs and no lower extremity edema ABDOMEN:abdomen soft, non-tender and normal bowel sounds MUSCULOSKELETAL:no cyanosis of digits and no clubbing  NEURO: alert & oriented x 3 with fluent speech, no focal motor/sensory deficits EXTREMITIES: No lower extremity edema BREAST: No palpable masses or nodules in either right or left breasts. No palpable axillary supraclavicular or infraclavicular adenopathy no breast tenderness or nipple discharge. (exam performed in the presence of a chaperone)  LABORATORY DATA:  I have reviewed the data as listed   Chemistry      Component Value Date/Time   NA 140 08/14/2016 1200   NA 138 11/04/2013 0929   K 3.6 08/14/2016 1200   K 3.8 11/04/2013 0929   CL 107 08/14/2016 1200   CL 107 03/22/2012 0853   CO2 26 08/14/2016 1200   CO2 23 11/04/2013 0929   BUN 14 08/14/2016 1200   BUN 12.1 11/04/2013 0929   CREATININE 0.73 08/14/2016 1200   CREATININE 0.8 11/04/2013 0929      Component Value Date/Time   CALCIUM 9.9 08/14/2016 1200   CALCIUM 9.6 11/04/2013 0929   ALKPHOS 87 08/14/2016 1200   ALKPHOS 108 11/04/2013 0929   AST 13 08/14/2016 1200   AST 12 11/04/2013 0929   ALT 12 08/14/2016 1200   ALT 13 11/04/2013 0929   BILITOT 0.5 08/14/2016 1200   BILITOT 0.28 11/04/2013 0929       Lab Results  Component Value Date   WBC 10.8 (H) 07/14/2016   HGB 13.4 07/14/2016   HCT 39.9 07/14/2016   MCV 71.0 (L) 07/14/2016   PLT 236 07/14/2016   NEUTROABS 7.2 07/14/2016    ASSESSMENT & PLAN:  Malignant neoplasm of upper-outer quadrant of left breast in female, estrogen receptor positive ([place]) 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 Plan to treat her until December 2018.  Arimidex toxicities 1. Mild muscle aches and pains 2. Bone density 07/11/2016:  T score -1.5 osteopenia : Continue with calcium and vitamin D.  Breast cancer surveillance: 1. Breast exam 11/23/2016  is without any abnormalities 2. Mammograms 06/16/2016  Normal; Breast density C 3. CT abdomen and pelvis done for abdominal pain 07/15/2016: Minimal colonic diverticulosis   Vaginal dryness: patient using over-the-counter remedies  Return to clinic in 1 year for follow-up for long-term survivorship with Mendel Ryder   I spent 25 minutes talking to the patient of which more than half was spent in counseling and coordination of care.  No orders of the defined types were placed in this encounter.  The patient has a good understanding of the overall plan. she agrees with it. she will call with any problems that may develop before the  next visit here.   Rulon Eisenmenger, MD 11/23/16

## 2016-11-23 NOTE — Assessment & Plan Note (Signed)
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 Plan to treat Marilyn [name] until December 2018.  Arimidex toxicities 1. Mild muscle aches and pains 2. Bone density 07/11/2016:  T score -1.5 osteopenia : Continue with calcium and vitamin D.  Breast cancer surveillance: 1. Breast exam 11/23/2016  is without any abnormalities 2. Mammograms 06/16/2016  Normal; Breast density C 3. CT abdomen and pelvis done for abdominal pain 07/15/2016: Minimal colonic diverticulosis   Vaginal dryness:  Return to clinic in 1 year for follow-up

## 2016-11-23 NOTE — Telephone Encounter (Signed)
Gave patient avs and calendar with appts per 9/27 los

## 2016-12-12 ENCOUNTER — Encounter: Payer: Self-pay | Admitting: Genetics

## 2017-01-19 ENCOUNTER — Other Ambulatory Visit: Payer: Self-pay | Admitting: Hematology and Oncology

## 2017-01-29 ENCOUNTER — Ambulatory Visit: Payer: No Typology Code available for payment source | Admitting: Internal Medicine

## 2017-01-29 ENCOUNTER — Encounter: Payer: Self-pay | Admitting: Internal Medicine

## 2017-01-29 DIAGNOSIS — F410 Panic disorder [episodic paroxysmal anxiety] without agoraphobia: Secondary | ICD-10-CM | POA: Diagnosis not present

## 2017-01-29 DIAGNOSIS — F17200 Nicotine dependence, unspecified, uncomplicated: Secondary | ICD-10-CM

## 2017-01-29 MED ORDER — CLONAZEPAM 0.5 MG PO TABS: 0.5000 mg | ORAL_TABLET | Freq: Two times a day (BID) | ORAL | 5 refills | Status: DC | PRN

## 2017-01-29 NOTE — Assessment & Plan Note (Signed)
On Buspar, bupropion Xanax prn  Potential benefits of a long term benzodiazepines  use as well as potential risks  and complications were explained to the patient and were aknowledged.

## 2017-01-29 NOTE — Progress Notes (Signed)
Subjective:  Patient ID: Marilyn [name], female    DOB: 1955-12-10  Age: 61 y.o. MRN: [id number]  CC: No chief complaint on file.   HPI Marilyn [name] presents for anxiety, breast ca, asthma/COPD  Outpatient Medications Prior to Visit  Medication Sig Dispense Refill  . ALPRAZolam (XANAX) 0.5 MG tablet Take 1/2 to 1 tablet by mouth three times daily as needed for panic attacks only 20 tablet 0  . anastrozole (ARIMIDEX) 1 MG tablet TAKE 1 TABLET (1 MG TOTAL) BY MOUTH DAILY. 30 tablet 0  . budesonide-formoterol (SYMBICORT) 80-4.5 MCG/ACT inhaler Inhale 2 puffs into the lungs 2 (two) times daily. 1 Inhaler 11  . busPIRone (BUSPAR) 10 MG tablet Take 1 tablet (10 mg total) by mouth 2 (two) times daily. 180 tablet 2  . Cholecalciferol 5000 UNITS TABS Take 1 tablet by mouth 3 (three) times a week.    . clobetasol cream (TEMOVATE) 5.40 % Apply 1 application topically 2 (two) times daily. 60 g 3  . Multiple Vitamin (MULTIVITAMIN WITH MINERALS) TABS tablet Take 1 tablet by mouth daily.    . clonazePAM (KLONOPIN) 0.5 MG tablet Take 1 tablet (0.5 mg total) by mouth 2 (two) times daily as needed for anxiety. 60 tablet 5   No facility-administered medications prior to visit.     ROS Review of Systems  Constitutional: Negative for activity change, appetite change, chills, fatigue and unexpected weight change.  HENT: Negative for congestion, mouth sores and sinus pressure.   Eyes: Negative for visual disturbance.  Respiratory: Negative for cough and chest tightness.   Gastrointestinal: Negative for abdominal pain and nausea.  Genitourinary: Negative for difficulty urinating, frequency and vaginal pain.  Musculoskeletal: Negative for back pain and gait problem.  Skin: Negative for pallor and rash.  Neurological: Negative for dizziness, tremors, weakness, numbness and headaches.  Psychiatric/Behavioral: Negative for confusion and sleep disturbance. The patient is nervous/anxious.      Objective:  BP 124/72 (BP Location: Right Arm, Patient Position: Sitting, Cuff Size: Large)   Pulse 68   Temp 98.4 F (36.9 C) (Oral)   Ht 5' 2.25" (1.581 m)   Wt 213 lb (96.6 kg)   SpO2 99%   BMI 38.65 kg/m   BP Readings from Last 3 Encounters:  01/29/17 124/72  11/23/16 (!) 149/87  10/20/16 128/80    Wt Readings from Last 3 Encounters:  01/29/17 213 lb (96.6 kg)  11/23/16 217 lb 3.2 oz (98.5 kg)  10/20/16 215 lb (97.5 kg)    Physical Exam  Constitutional: She appears well-developed. No distress.  HENT:  Head: Normocephalic.  Right Ear: External ear normal.  Left Ear: External ear normal.  Nose: Nose normal.  Mouth/Throat: Oropharynx is clear and moist.  Eyes: Conjunctivae are normal. Pupils are equal, round, and reactive to light. Right eye exhibits no discharge. Left eye exhibits no discharge.  Neck: Normal range of motion. Neck supple. No JVD present. No tracheal deviation present. No thyromegaly present.  Cardiovascular: Normal rate, regular rhythm and normal heart sounds.  Pulmonary/Chest: No stridor. No respiratory distress. She has no wheezes.  Abdominal: Soft. Bowel sounds are normal. She exhibits no distension and no mass. There is no tenderness. There is no rebound and no guarding.  Musculoskeletal: She exhibits no edema or tenderness.  Lymphadenopathy:    She has no cervical adenopathy.  Neurological: She displays normal reflexes. No cranial nerve deficit. She exhibits normal muscle tone. Coordination normal.  Skin: No rash noted. No erythema.  Psychiatric:  She has a normal mood and affect. Her behavior is normal. Judgment and thought content normal.  obese  Lab Results  Component Value Date   WBC 10.8 (H) 07/14/2016   HGB 13.4 07/14/2016   HCT 39.9 07/14/2016   PLT 236 07/14/2016   GLUCOSE 95 08/14/2016   CHOL 210 (H) 08/14/2016   TRIG 85.0 08/14/2016   HDL 62.10 08/14/2016   LDLDIRECT 118.9 12/18/2011   LDLCALC 131 (H) 08/14/2016   ALT 12  08/14/2016   AST 13 08/14/2016   NA 140 08/14/2016   K 3.6 08/14/2016   CL 107 08/14/2016   CREATININE 0.73 08/14/2016   BUN 14 08/14/2016   CO2 26 08/14/2016   TSH 0.87 08/14/2016    Ct Abdomen Pelvis W Contrast  Result Date: 07/15/2016 CLINICAL DATA:  Abdominal pain and diarrhea.  Nausea. EXAM: CT ABDOMEN AND PELVIS WITH CONTRAST TECHNIQUE: Multidetector CT imaging of the abdomen and pelvis was performed using the standard protocol following bolus administration of intravenous contrast. CONTRAST:  133mL ISOVUE-300 IOPAMIDOL (ISOVUE-300) INJECTION 61% COMPARISON:  None. FINDINGS: Lower chest: Linear atelectasis in the lower lobes. No consolidation or pleural fluid. Hepatobiliary: Minimal focal fatty infiltration adjacent with falciform ligament. No focal lesion. Gallbladder is decompressed, no calcified gallstone. No biliary dilatation. Pancreas: No ductal dilatation or inflammation. Spleen: Normal in size without focal abnormality. Adrenals/Urinary Tract: Normal adrenal glands. No hydronephrosis or perinephric edema. Simple cysts in the upper left kidney measures 2.2 cm. Small cortical hypodensity in the lower left kidney is too small to characterize. Ureters are decompressed. Urinary bladder is physiologically distended. Stomach/Bowel: Stomach physiologically distended. No small bowel inflammation, obstruction or wall thickening. Normal appendix. Formed stool throughout the colon without colonic wall thickening. No pericolonic inflammation. The scattered colonic diverticulosis in the descending and sigmoid colon without acute diverticulitis. Vascular/Lymphatic: Mild aortic atherosclerosis without aneurysm. No adenopathy. Reproductive: Status post hysterectomy. No adnexal masses. Other: No free air, free fluid, or intra-abdominal fluid collection. Tiny fat containing umbilical hernia. Musculoskeletal: There are no acute or suspicious osseous abnormalities. Degenerative disc disease at L4-L5 with  vacuum phenomenon. Bone island within L5. IMPRESSION: 1. No explanation for abdominal pain and diarrhea. 2. Minimal colonic diverticulosis without acute diverticulitis. 3. Mild aortic atherosclerosis. Electronically Signed   By: Jeb Levering M.D.   On: 07/15/2016 00:47    Assessment & Plan:   There are no diagnoses linked to this encounter. I am having Midge Minium. Stipe maintain her multivitamin with minerals, Cholecalciferol, budesonide-formoterol, clobetasol cream, ALPRAZolam, busPIRone, anastrozole, and clonazePAM.  Meds ordered this encounter  Medications  . clonazePAM (KLONOPIN) 0.5 MG tablet    Sig: Take 1 tablet (0.5 mg total) by mouth 2 (two) times daily as needed for anxiety.    Dispense:  60 tablet    Refill:  5     Follow-up: No Follow-up on file.  Walker Kehr, MD

## 2017-01-29 NOTE — Assessment & Plan Note (Signed)
Labs

## 2017-01-29 NOTE — Assessment & Plan Note (Signed)
1/2 PPD

## 2017-02-13 ENCOUNTER — Ambulatory Visit: Payer: No Typology Code available for payment source | Admitting: Pulmonary Disease

## 2017-02-13 ENCOUNTER — Encounter: Payer: Self-pay | Admitting: Pulmonary Disease

## 2017-02-13 VITALS — BP 122/68 | HR 76 | Temp 97.8°F | Ht 65.0 in | Wt 217.0 lb

## 2017-02-13 DIAGNOSIS — C50412 Malignant neoplasm of upper-outer quadrant of left female breast: Secondary | ICD-10-CM

## 2017-02-13 DIAGNOSIS — D869 Sarcoidosis, unspecified: Secondary | ICD-10-CM

## 2017-02-13 DIAGNOSIS — F17200 Nicotine dependence, unspecified, uncomplicated: Secondary | ICD-10-CM

## 2017-02-13 DIAGNOSIS — Z170 Estrogen receptor positive status [ER+]: Secondary | ICD-10-CM

## 2017-02-13 DIAGNOSIS — R0600 Dyspnea, unspecified: Secondary | ICD-10-CM

## 2017-02-13 DIAGNOSIS — F419 Anxiety disorder, unspecified: Secondary | ICD-10-CM

## 2017-02-13 NOTE — Patient Instructions (Signed)
Today we updated your med list in our EPIC system...    Continue your current medications the same...  Continue to work on smoking cessation...    Try the nicotine replacement options - patches, gum, l;ozenges...    The Klonopin/ Buspar/ etc should help as well...    Let me know if you want to try Chantix...  Call for any questions...  Let's plan a follow up visit in 63mo, sooner if needed for problems.Marland KitchenMarland Kitchen

## 2017-03-28 ENCOUNTER — Other Ambulatory Visit: Payer: Self-pay | Admitting: Obstetrics & Gynecology

## 2017-03-28 DIAGNOSIS — N6320 Unspecified lump in the left breast, unspecified quadrant: Secondary | ICD-10-CM

## 2017-04-02 ENCOUNTER — Other Ambulatory Visit: Payer: No Typology Code available for payment source

## 2017-04-04 ENCOUNTER — Ambulatory Visit
Admission: RE | Admit: 2017-04-04 | Discharge: 2017-04-04 | Disposition: A | Discharge status: Home / Self Care | Payer: No Typology Code available for payment source | Source: Ambulatory Visit | Attending: Obstetrics & Gynecology | Admitting: Obstetrics & Gynecology

## 2017-04-04 DIAGNOSIS — N6320 Unspecified lump in the left breast, unspecified quadrant: Secondary | ICD-10-CM

## 2017-04-30 ENCOUNTER — Other Ambulatory Visit: Payer: Self-pay | Admitting: Obstetrics & Gynecology

## 2017-04-30 ENCOUNTER — Other Ambulatory Visit: Payer: Self-pay | Admitting: Internal Medicine

## 2017-04-30 DIAGNOSIS — Z1231 Encounter for screening mammogram for malignant neoplasm of breast: Secondary | ICD-10-CM

## 2017-06-18 ENCOUNTER — Ambulatory Visit: Payer: No Typology Code available for payment source

## 2017-06-20 ENCOUNTER — Telehealth: Payer: Self-pay | Admitting: Pulmonary Disease

## 2017-06-20 ENCOUNTER — Ambulatory Visit
Admission: RE | Admit: 2017-06-20 | Discharge: 2017-06-20 | Disposition: A | Discharge status: Home / Self Care | Payer: No Typology Code available for payment source | Source: Ambulatory Visit | Attending: Obstetrics & Gynecology | Admitting: Obstetrics & Gynecology

## 2017-06-20 DIAGNOSIS — Z1231 Encounter for screening mammogram for malignant neoplasm of breast: Secondary | ICD-10-CM

## 2017-06-20 MED ORDER — BUDESONIDE-FORMOTEROL FUMARATE 80-4.5 MCG/ACT IN AERO: 2.0000 | INHALATION_SPRAY | Freq: Two times a day (BID) | RESPIRATORY_TRACT | 11 refills | Status: DC

## 2017-06-20 NOTE — Telephone Encounter (Signed)
No sym 80 samples in office at this time. Provided pt with coupon card.  Pt requesting 30 day supply to be sent to [facility] on [address].  This has been sent.  Nothing further needed.

## 2017-07-30 ENCOUNTER — Ambulatory Visit: Payer: No Typology Code available for payment source

## 2017-07-30 ENCOUNTER — Ambulatory Visit: Payer: No Typology Code available for payment source | Admitting: Internal Medicine

## 2017-08-02 ENCOUNTER — Ambulatory Visit: Payer: No Typology Code available for payment source | Admitting: Internal Medicine

## 2017-08-02 ENCOUNTER — Encounter: Payer: Self-pay | Admitting: Internal Medicine

## 2017-08-02 VITALS — BP 120/78 | HR 69 | Temp 98.2°F | Ht 65.0 in | Wt 217.0 lb

## 2017-08-02 DIAGNOSIS — Z0000 Encounter for general adult medical examination without abnormal findings: Secondary | ICD-10-CM

## 2017-08-02 DIAGNOSIS — E559 Vitamin D deficiency, unspecified: Secondary | ICD-10-CM | POA: Diagnosis not present

## 2017-08-02 DIAGNOSIS — F17200 Nicotine dependence, unspecified, uncomplicated: Secondary | ICD-10-CM

## 2017-08-02 DIAGNOSIS — F410 Panic disorder [episodic paroxysmal anxiety] without agoraphobia: Secondary | ICD-10-CM | POA: Diagnosis not present

## 2017-08-02 DIAGNOSIS — J4520 Mild intermittent asthma, uncomplicated: Secondary | ICD-10-CM | POA: Diagnosis not present

## 2017-08-02 MED ORDER — BUPROPION HCL ER (XL) 150 MG PO TB24: 150.0000 mg | ORAL_TABLET | Freq: Every day | ORAL | 5 refills | Status: DC

## 2017-08-02 MED ORDER — ALPRAZOLAM 0.5 MG PO TABS: ORAL_TABLET | ORAL | 2 refills | Status: DC

## 2017-08-02 NOTE — Progress Notes (Signed)
Subjective:  Patient ID: Marilyn [name], female    DOB: 29-Nov-1955  Age: 62 y.o. MRN: [id number]  CC: No chief complaint on file.   HPI Marilyn [name] presents for anxiety. C/o moodiness, stress - caring for mom w/dementia - paranoid... F/u COPD  Outpatient Medications Prior to Visit  Medication Sig Dispense Refill  . ALPRAZolam (XANAX) 0.5 MG tablet Take 1/2 to 1 tablet by mouth three times daily as needed for panic attacks only 20 tablet 0  . budesonide-formoterol (SYMBICORT) 80-4.5 MCG/ACT inhaler Inhale 2 puffs into the lungs 2 (two) times daily. 1 Inhaler 11  . busPIRone (BUSPAR) 10 MG tablet Take 1 tablet (10 mg total) by mouth 2 (two) times daily. 180 tablet 2  . Cholecalciferol 5000 UNITS TABS Take 1 tablet by mouth 3 (three) times a week.    . clobetasol cream (TEMOVATE) 9.37 % Apply 1 application topically 2 (two) times daily. 60 g 3  . clonazePAM (KLONOPIN) 0.5 MG tablet Take 1 tablet (0.5 mg total) by mouth 2 (two) times daily as needed for anxiety. 60 tablet 5  . Multiple Vitamin (MULTIVITAMIN WITH MINERALS) TABS tablet Take 1 tablet by mouth daily.    Marland Kitchen anastrozole (ARIMIDEX) 1 MG tablet TAKE 1 TABLET (1 MG TOTAL) BY MOUTH DAILY. 30 tablet 0   No facility-administered medications prior to visit.     ROS: Review of Systems  Constitutional: Negative for activity change, appetite change, chills, fatigue and unexpected weight change.  HENT: Negative for congestion, mouth sores and sinus pressure.   Eyes: Negative for visual disturbance.  Respiratory: Negative for cough and chest tightness.   Gastrointestinal: Negative for abdominal pain and nausea.  Genitourinary: Negative for difficulty urinating, frequency and vaginal pain.  Musculoskeletal: Negative for back pain.  Skin: Negative for pallor and rash.  Neurological: Negative for dizziness, tremors, weakness, numbness and headaches.  Psychiatric/Behavioral: Negative for confusion, sleep disturbance and suicidal  ideas. The patient is nervous/anxious.     Objective:  BP 120/78 (BP Location: Right Arm, Patient Position: Sitting, Cuff Size: Large)   Pulse 69   Temp 98.2 F (36.8 C) (Oral)   Ht 5\' 5"  (1.651 m)   Wt 217 lb (98.4 kg)   SpO2 99%   BMI 36.11 kg/m   BP Readings from Last 3 Encounters:  08/02/17 120/78  02/13/17 122/68  01/29/17 124/72    Wt Readings from Last 3 Encounters:  08/02/17 217 lb (98.4 kg)  02/13/17 217 lb (98.4 kg)  01/29/17 213 lb (96.6 kg)    Physical Exam  Constitutional: She appears well-developed. No distress.  HENT:  Head: Normocephalic.  Right Ear: External ear normal.  Left Ear: External ear normal.  Nose: Nose normal.  Mouth/Throat: Oropharynx is clear and moist.  Eyes: Pupils are equal, round, and reactive to light. Conjunctivae are normal. Right eye exhibits no discharge. Left eye exhibits no discharge.  Neck: Normal range of motion. Neck supple. No JVD present. No tracheal deviation present. No thyromegaly present.  Cardiovascular: Normal rate, regular rhythm and normal heart sounds.  Pulmonary/Chest: No stridor. No respiratory distress. She has no wheezes.  Abdominal: Soft. Bowel sounds are normal. She exhibits no distension and no mass. There is no tenderness. There is no rebound and no guarding.  Musculoskeletal: She exhibits no edema or tenderness.  Lymphadenopathy:    She has no cervical adenopathy.  Neurological: She displays normal reflexes. No cranial nerve deficit. She exhibits normal muscle tone. Coordination normal.  Skin: No rash  noted. No erythema.  Psychiatric: She has a normal mood and affect. Her behavior is normal. Judgment and thought content normal.    Lab Results  Component Value Date   WBC 10.8 (H) 07/14/2016   HGB 13.4 07/14/2016   HCT 39.9 07/14/2016   PLT 236 07/14/2016   GLUCOSE 95 08/14/2016   CHOL 210 (H) 08/14/2016   TRIG 85.0 08/14/2016   HDL 62.10 08/14/2016   LDLDIRECT 118.9 12/18/2011   LDLCALC 131 (H)  08/14/2016   ALT 12 08/14/2016   AST 13 08/14/2016   NA 140 08/14/2016   K 3.6 08/14/2016   CL 107 08/14/2016   CREATININE 0.73 08/14/2016   BUN 14 08/14/2016   CO2 26 08/14/2016   TSH 0.87 08/14/2016    Mm Screening Breast Tomo Bilateral  Result Date: 06/20/2017 CLINICAL DATA:  Screening. EXAM: DIGITAL SCREENING BILATERAL MAMMOGRAM WITH TOMO AND CAD COMPARISON:  Previous exam(s). ACR Breast Density Category c: The breast tissue is heterogeneously dense, which may obscure small masses. FINDINGS: There are no findings suspicious for malignancy. Images were processed with CAD. IMPRESSION: No mammographic evidence of malignancy. A result letter of this screening mammogram will be mailed directly to the patient. RECOMMENDATION: Screening mammogram in one year. (Code:SM-B-01Y) BI-RADS CATEGORY  1: Negative. Electronically Signed   By: Lovey Newcomer M.D.   On: 06/20/2017 10:15    Assessment & Plan:   There are no diagnoses linked to this encounter.   No orders of the defined types were placed in this encounter.    Follow-up: No follow-ups on file.  Walker Kehr, MD

## 2017-08-02 NOTE — Assessment & Plan Note (Signed)
Try to stop smoking. °

## 2017-08-02 NOTE — Assessment & Plan Note (Signed)
Vit d 

## 2017-08-02 NOTE — Assessment & Plan Note (Signed)
Try to quit

## 2017-08-02 NOTE — Assessment & Plan Note (Signed)
Xanax prn Started Wellbutrin XL

## 2017-08-08 ENCOUNTER — Ambulatory Visit: Payer: No Typology Code available for payment source

## 2017-08-14 ENCOUNTER — Telehealth: Payer: Self-pay | Admitting: Pulmonary Disease

## 2017-08-14 ENCOUNTER — Ambulatory Visit: Payer: No Typology Code available for payment source | Admitting: Pulmonary Disease

## 2017-08-14 ENCOUNTER — Encounter: Payer: Self-pay | Admitting: Pulmonary Disease

## 2017-08-14 ENCOUNTER — Ambulatory Visit (INDEPENDENT_AMBULATORY_CARE_PROVIDER_SITE_OTHER)
Admission: RE | Admit: 2017-08-14 | Discharge: 2017-08-14 | Disposition: A | Discharge status: Home / Self Care | Payer: No Typology Code available for payment source | Source: Ambulatory Visit | Attending: Pulmonary Disease | Admitting: Pulmonary Disease

## 2017-08-14 ENCOUNTER — Other Ambulatory Visit: Payer: No Typology Code available for payment source

## 2017-08-14 VITALS — BP 124/72 | HR 76 | Temp 97.9°F | Ht 65.0 in | Wt 215.6 lb

## 2017-08-14 DIAGNOSIS — D869 Sarcoidosis, unspecified: Secondary | ICD-10-CM

## 2017-08-14 DIAGNOSIS — R0600 Dyspnea, unspecified: Secondary | ICD-10-CM

## 2017-08-14 DIAGNOSIS — F419 Anxiety disorder, unspecified: Secondary | ICD-10-CM

## 2017-08-14 DIAGNOSIS — F17200 Nicotine dependence, unspecified, uncomplicated: Secondary | ICD-10-CM

## 2017-08-14 MED ORDER — CLONAZEPAM 0.5 MG PO TABS: ORAL_TABLET | ORAL | 0 refills | Status: DC

## 2017-08-14 NOTE — Telephone Encounter (Signed)
Called [facility] and spoke with Cristie Hem stating to him pt had an OV today with Dr. Lenna Gilford and at the [facility], the stated below was discussed with pt:  We discussed taking the KLONOPIN 0.5mg  1/2 to 1 tab twice daily on a regular basis for the panic attacks...    Don't take the Xanax while you are on the Klonopin regularly...  Stated to Cristie Hem pt has been made aware not to take the Xanax while taking Klonopin.  Alex expressed understanding and just wanted to make sure we were aware.  Nothing further needed.

## 2017-08-14 NOTE — Patient Instructions (Signed)
Today we updated your med list in our EPIC system...    Continue your current medications the same...  Today we checked a follow up CXR & your ACE blood test for the [place]...    We will contact you w/ the results when available...   We discussed taking the KLONOPIN 0.5mg  1/2 to 1 tab twice daily on a regular basis for the panic attacks...    Don't take the Xanax while you are on the Klonopin regularly...  Call for any questions or if I can be of service in any way.Marland KitchenMarland Kitchen

## 2017-08-14 NOTE — Progress Notes (Signed)
Subjective:    Patient ID: Marilyn [name], female    DOB: August 08, 1955, 62 y.o.   MRN: [id number]  HPI 62 y/o BF ex-smoker who quit 07/2015 and followed w/ hx "alveolar" sarcoidosis (exquisitely sens to Pred rx), and recurrent bronchitic infections... Marilyn [name]) remarried & her last name changed to Marilyn [name]... ~  SEE PREV EPIC NOTES FOR OLDER DATA >>     CXR 10/13 showed normal heart size, atherosclerotic calcif, clear lungs, surg clips within the left breast...  LABS 10/13:  FLP- on diet alone w/ TChol=214 LDL 119;  Chems- wnl;  CBC- ok w/ Hg=12.2 MCV=75 & rec to start on Fe;  TSH=1.65;  VitD=26  LABS 4/14:  FLP- ok on diet alone x LDL=115;  Chems- wnl;  CBC- ok w/ Hg=12.6 MCV=75;  TSH=1.40...   ~  June 23, 2013:  Yearly [place] notes some cough, nasal congestion, drainage, clear mucus; she is still smoking ~5cig/d she says & asked to quit completely; we discussed Mucinex, Delsym, Tessalon... She's had a lot of side effects from Arimidex (stiffness, "I feel old", wt gain) and felt better off this med- but now she is back on it per Marilyn [name]...  We reviewed the following medical problems during today's office visit >>     Bronchitis, Sarcoid, Smoker> still smoking 1/4-1/2ppd, off inhalers, uses Mucinex prn; notes mild cough, sm amt clear phlegm, no CP, +Sob/Doe w/o change & asked to incr exercise, get wt down...    VI> trace edema; she knows to elim sodium, elev legs, wear support hose, not on diuretics as yet...    Chol> on diet alone; FLP 4/14 shows TChol 200, TG 100, HDL 65, LDL 115    Overweight> she increased from 160#=> 238# due to Zoloft & Arimidex she says; we reviewed diet, exercise, wt reduction strategies...    GI- GERD, Divertics, IBS, Polyp, +FamHx> she continues to do well & denies abd pain, n/v, d/c, blood seen, etc; last colon 2011 & f/u due 62yr Marilyn [name]...    Breast Cancer> stage 1 invasive ductal carcinoma, s/p lumpectomy & sentinel node bx (neg) 4/13, followed  by XRT 6-8/13, w/ Arimidex'1mg'$ /d starting 8/13 per Marilyn [name] & enduring the side effects...    DJD, VitD> off Tramadol, on OTC meds as needed; +Calcium/ VitD1000; she notes some left hand paresthesias- rec wrist splint trial...    Headaches> no recent problems, she uses OTC meds as needed...    Anxiety, Panic> on Alpraz0.'5mg'$  prn & [facility]; she is under a lot of stress...    Anemia> CBCs in epic shows Hg ~12 range... We reviewed prob list, meds, xrays and labs> see below for updates >>   CXR 4/15 showed norm heart size, clear lungs, no adenopathy, NAD...  PFT 4/15 showed FVC=3.49 (127%), FEV1=2.45 (112%), %1sec=70, mid-flows=61%predicted...  LABS 1/15-3/15: reviewed...   ~  September 27, 2015:  27 month ROV & pulmonary follow up visit>  [name] 04/2015 & her new last name is Marilyn [name]; her PCP is Marilyn [name]...    62 y/o BF w/ a remote hx of "alveolar sarcoid" & she has been off Pred since 1992- see below;  She has been a mild cigarette smoker- generally <1/2ppd x yrs w/ ~30 pack-yr smoking hx; she tells me that she quit ~1 month ago!  She notes that was recently diagnosed w/ COPD based on a CXR from 06/2015 showing hyperinflation (otherw clear) per the radiologist;  However she had Spirometry 05/2013 showing superphysiologic numbers and  just some small airways dis w/ mid-flows at 61% predicted;  She has not prev required inhalers, but she was given a trial of Stiolto- 2sp daily from Marilyn [name] & she says she feels some better on this...    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    MRenessaalso admit to quite a bit of stress over the yrs- Father is ill w/ hx colon cancer, prostate cancer, and bladder cancer; one sister passed at 641w/ hx breast cancer, lung cancer, and pancreatic cancer; one sister passed at 67from ?sarcoid, ?MS;  Marilyn [name] has a hx anxiety & panic disorder on prn Alprazolam, also followed by Marilyn [name] at [name] for hx breast cancer- on Arimidex...     See problem list above...  EXAM shows Afeb, VSS, O2sat=99% on RA;  Wt=221#, 5'5"Tall, BMI=36-7;  HEENT- neg, mallampati1;  Chest- clear w/o w/r/r;  Heart- RR w/o m/r/g;  Abd- soft, nontender, neg;  Ext- neg w/o c/c/e;  Neuro- intact...  CXR 07/27/15>  Norm heart size, sl hyperinflation, clear lungs, left breast clips and scoliosis, NAD...  FullPFT 09/29/15>  FVC=3.04 (111%), FEV1=2.18 (102%), %1sec=72, mid-flows sl reduced at 70% predicted;  TLC=5.51 (105%), RV=2.60 (125%), RV/TLC=47%;  DLCO=58% predicted;  This is c/w min airflow obstruction mostly in the small airways, some mild air trapping & decr in DLCO...   LABS 06/2015 in Epic> reviewed-- CBC is ok w/ Hg=12.6;  Chems- wnl;  Thyroid- wnl... IMP/PLAN>>  Marilyn [name] has been a modest smoker & quit 137mogo; her CXR report indicates sl hyperinflation (& clear) but her PFT again does not reveal much airflow obstruction, lung volumes show some mild air trapping & curiously her DLCO is mod reduced;  She has a remote hx of Sarcoidosis but at that time she had a very prominent CXR pattern of "alveolar sarcoid" which responded nicely to Pred & a slow taper;  We discussed proceeding w/ Hi-res CT Chest for further eval vs trial of KLONOPIN as a combination relaxer for her symptoms of SOB w/ short term follow up to assertain it's effect on  her breathing;  She would like to try the latter & we will plan recheck in ~6wks...  ~  November 09, 2015:  6wk ROPretty Bayoueports that the Klonopin helped her breathing but 0.28m828mid was too strong- sleepy; she has been under stress w/ family health issues; she denies cough, sput, hemoptysis, CP, palpit, edema; her SOB is much improved but she has had a relapse w/ smoking "I've had a few" she says...     EXAM shows Afeb, VSS, O2sat=98% on RA;  Wt=221#, 5'5"Tall, BMI=36-7;  HEENT- neg, mallampati1;  Chest- clear w/o w/r/r;  Heart- RR w/o m/r/g;  Abd- soft, nontender, neg;  Ext- neg w/o c/c/e;  Neuro- intact... IMP/PLAN>>  Dyspnea> likely related to stress and "chest wall muscle spasm", improved on Klonopin0.'5mg'$ - pt asked to decr to 1/2 tab bid; she is on Stiolto per Marilyn [name]- 2sp daily & we discussed weaning... Given 2017 FLU vaccine today.    Hx Sarcoidosis in remission    Recurrent bronchitic infections    Cig smoker    PFTs w/ decr DLCO    Medical issues>  HL, GERD, Divertics/ IBS/ polyps & +FamHx colon Ca, Hx breast cancer on Arimidex, Hx anemia & Fe defic, Dysthymia on Wellbutrin & Klonopin...  ~  March 13, 2016:  44moROV & Marilyn [name] reports that her SOB is improved w/ the Klonopin- '5mg'$  1 tab bid (tol well),  but she notes a dry cough in the cold weather; she remains on Stiolto 1 puff daily and is still smoking ~4 cig/d;  She is deconditioned and still not exercising!  Prob list as above...   EXAM shows Afeb, VSS, O2sat=98% on RA;  Wt=224#, 5'5"Tall, BMI=37;  HEENT- neg, mallampati1;  Chest- clear w/o w/r/r;  Heart- RR w/o m/r/g;  Abd- soft, nontender, neg;  Ext- neg w/o c/c/e;  Neuro- intact..  IMP/PLAN>>  Marilyn [name] is her own worst enemy- needs to quit all smoking & start an exercise program; we reviewed the specifics of these recommendations & outlined the benefits etc, she...     NOTE: >50% of this 135m rov was spent in counseling & coordination of care...   ~  May 08, 2016:  54m21moV  & pulmonary/ medical follow up visit>  Marilyn [name] indicates to me that she has quit smoking & her last cig was 3 wks ago! She is hoping to remain quit but husb smokes; she notes some wheezing on & off esp at night & some SOB esp w/ exercise & hills; it appears that her Stiolto is not holding her & we discussed change to Symbicort160-2spBid; she is still taking the Klonopin 0.'5mg'$  bid but notes too groggy & again reminded to decr to 1/2 tab Bid; she's been exercising by walking & has a home gym but needs to incr the intensity of exercise, & consider weight watchers; we reviewed the following medical problems during today's office visit >>     Dyspnea> likely related to stress and "chest wall muscle spasm", improved on Klonopin0.'5mg'$ - pt asked to decr to 1/2 tab bid.    Hx Sarcoidosis in remission    Recurrent bronchitic infections    Ex-Cig smoker    PFTs w/ decr DLCO    Medical issues>  HL, GERD, Divertics/ IBS/ polyps & +FamHx colon Ca, Hx breast cancer on Arimidex, Hx anemia & Fe defic, Dysthymia on Wellbutrin & Klonopin... EXAM shows Afeb, VSS, O2sat=97% on RA;  Wt=223#, 5'5"Tall, BMI=37;  HEENT- neg, mallampati1;  Chest- clear w/o w/r/r;  Heart- RR w/o m/r/g;  Abd- soft, nontender, neg;  Ext- VI, tr edema no c/c;  Neuro- intact..   CXR 05/08/16 (independently reviewed by me in the PACS system)> norm heart size, atherosclerotic ao, mild hyperinflation & peribronchial thickening, otherw clear- NAD; clips in left breast area & osteopenia...   LABS 05/08/16>  ACE level = 29 IMP/PLAN>>  Marilyn [name] quit smoking 3 wks ago & hopefully she can maintain (husb still smokes); she's noted some wheezing esp Qhs & the Stiolto is not holding her- we will switch to SYMBICORT160-2spBid;  She also c/o DOE w/ exercise & hills but she is overwt, hasn't really been on diet/ exercise program as we outlined for her & she understands  that further improvement requires a lot of hard work on her part!  She was concerned that her symptoms were  related to reactivation of her Sarcoid- but CXR & ACE remain normal & she is reassured;  Finally she notes that the Bokoshe works well for her SOB/ anxiety but 0.'5mg'$  Bid makes her too groggy & rec to decr to 1/2 tab Bid as discussed... we plan rov in 67mo  ~  August 14, 2016:  3267moOV & in March Pt had indicated that she quit smoking but was worried because husb still smokes in the house- she indicates that she has remained quit so far, breathing is improved using Symbicort80-2spBid & not using the Klonopin as often; she denies wheezing, DOE is improved, Klonopin still helps when needed;  For exercise she is doing yard work, walking, etc...  We reviewed the following interval Epic notes>      She saw Ophthalmology- DrHecker 04/12/16>  Glaucoma suspect- no progression, bilat early cats, dry eye syndrome, allergic conjunctivitis, myopia- wears contacts...     [facility]>  Note reviewed- c/o 1wk hx cramping/ nausea, diarrhea, hx hyst (no other surgeries), afeb w/ benign exam, Labs showed Hg=13.4, mcv=71, WBC=10.8, Chems- wnl, UA- clear, Lipase wnl;  CT Abd&Pelvis showed min divertics w/o inflamm, 2cm cyst left kidney, s/p hyst, mild Ao atherosclerosis, no explanation for her abd discomfort... Rec to take Omep20 & f/u w/ PCP...    She saw PCP- Marilyn [name] 07/28/16>  Annual exam- said she had gastroenteritis which resolved, labs & CT reviewed, no change in her meds, advised to lose wt & try tumeric for pain... We reviewed the following medical problems during today's office visit>      Dyspnea> likely related to stress and "chest wall muscle spasm", improved on Klonopin0.'5mg'$ - taking 1/2 tab bid prn; note: she doesn't need both Klonopin & Xanax...    Hx Sarcoidosis in remission- last CXR 04/2016    Recurrent bronchitic infections    Ex-Cig smoker    PFTs w/ decr DLCO    Medical issues>  HL, GERD, Divertics/ IBS/ polyps & +FamHx colon Ca, Hx breast cancer on Arimidex, Hx anemia & Fe  defic, Dysthymia on Wellbutrin & Klonopin... EXAM shows Afeb, VSS, O2sat=98% on RA;  Wt=214#, 5'5"Tall, BMI=36;  HEENT- neg, mallampati1;  Chest- clear w/o w/r/r;  Heart- RR w/o m/r/g;  Abd- soft, nontender, neg;  Ext- VI, tr edema no c/c;  Neuro- intact..  IMP/PLAN>>  Marilyn [name] is stable from the pulm standpoint- congrats for not smoking, keep up the good work w/ diet & exercise, call for any breathing problems...    ~  February 13, 2017:  67m67moV & unfortunately MarLevy Sjogrens restarted smoking & back up to 1/2ppd- notes sl incr SOB, taking Klonopin 0.'5mg'$ - 1/2 tab bid & this helps;  PCP placed her on Buspar for panic disorder & prn Xanax- I explained that she doesn't need both Klonopin & Xanax;  She notes sl cough, small amtclear sput w/o blood etc (uses Mucinex prn); she has some left CWP w/ lifting- advised rx w/ rib binder/ heat/ etc... Weight stable, she's been sl less active & advised on diet/ exercise/ wt reduction strategies...  We reviewed the following interval Epic notes>      She PCP team- DrJSchmitz on 09/20/16>  C/o panic attacks, wanted rescue inhaler due to incr dyspnea; noted Buspar helped her panic attacks but she was off this med- restarted by DrSchmitz;  She saw ONCOLOGY- Marilyn [name] on 11/23/16>  Surveillance on anastrozole=> planned to stop 01/2017 Malignant neoplasm of upper-outer quadrant of left breast in female, estrogen receptor positive ([place])   06/20/2011 Surgery    Left breast lumpectomy with sentinel lymph node biopsy: 1 cm IDC, grade 1, ER positive, PR positive, HER-2 negative, Ki-67 6%    08/17/2011 - 09/28/2011 Radiation Therapy-- Adjuvant radiation therapy   10/02/2011 - 11/23/2016 Anti-estrogen oral therapy-- Arimidex 1 mg daily started 10/02/2011 stopped March 2015 for pain and restarted August 2015     She saw PCP- Marilyn [name] 01/29/17>  Routine follow up visit, felt to be stable, no changes made in her meds...  We reviewed the following medical problems during today's office  visit>      Dyspnea> likely related to stress and "chest wall muscle spasm", improved on Klonopin0.'5mg'$ - taking 1/2 tab bid prn; note: she doesn't need both Klonopin & Xanax...    Hx Sarcoidosis in remission- last CXR 04/2016    Recurrent bronchitic infections    Recurrent smoker>  We discussed options w/ current anxiolytics, nicotine replacement, or Chantix (she will let me know if she wants to try this latter).    PFTs 09/2015 w/ decr DLCO (but normal airflow w/o obstructive dis)...    Medical issues>  HL, GERD, Divertics/ IBS/ polyps & +FamHx colon Ca, Hx breast cancer on Arimidex, Hx anemia & Fe defic, Dysthymia on Wellbutrin & Klonopin... EXAM shows Afeb, VSS, O2sat=98% on RA;  Wt=217#, 5'5"Tall, BMI=36;  HEENT- neg, mallampati1;  Chest- clear w/o w/r/r;  Heart- RR w/o m/r/g;  Abd- soft, nontender, neg;  Ext- VI, tr edema no c/c;  Neuro- intact..  IMP/PLAN>>  Marilyn [name] is rec to continue her current med regimen & follow up w/ me in 61mo sooner if needed prn...            Problem List:  PHYSICAL EXAMINATION (ICD-V70.0) - GYN= DrNeal who does her PAP smears, Mammograms (4/14- neg), and BMD... he also checked her VitD level and started VitD 50K weekly, then changed her to 2000 u daily... she is s/p hysterectomy in 2000... Note- she had an abd bruit investigated w/ an Abd MRA 6/08 and nothing found...  [facility]... Routine Mammogram 3/13 showed left breast lesion leading to surg by DrHoxworth, XRT by [name], Oncology f/u w/ DJanice [name]..   Hx of BRONCHITIS (ICD-490) - no recent episodes... Min cough & clear sputum... ~  Baseline CXR is clear and WNL (no residuals from her sarcoidosis) ~  Spirometry 7/06 showed FVC= 3.77 (130%), FEV1= 2.71 (113%), %1sec=72, mid-flows=66%... ~  CXR 11/12 showed normal heart size, min scarring, otherw clear, NAD..Marland Kitchen  ~  CXR 10/13 showed normal heart size, atherosclerotic calcif, clear lungs, surg clips within the left  breast... ~  1/14: she presented w/ refractory cough/ AB- given Depo, Pred taper, & the usual OTC meds... ~  CXR 2/14 showed norm heart size, clear lungs, mild peribronch thickening, surg clips overlying left breast...  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 totally resolved on Pred therapy w/ normalization of CXR and no recurrence off Pred now since 1992... ~  CXR 9/10 remains clear & WNL..Marland Kitchen ~  CXR 11/11 remains clear & WNL..Marland Kitchen ~  CXR 11/12 remains clear, mild basilar atx, otherw WNL... ~  EKG 11/12 showed NSR, rate62, wnl, NAD..Marland Kitchen ~  CXR 10/13 showed normal heart  size, atherosclerotic calcif, clear lungs, surg clips within the left breast... ~  CXR 2/14 showed norm heart size, clear lungs, mild peribronch thickening, surg clips overlying left breast... ~  CXR 4/15 showed norm heart size, clear lungs, no adenopathy, NAD... ~  PFT 4/15 showed FVC=3.49 (127%), FEV1=2.45 (112%), %1sec=70, mid-flows=61%predicted...  CIGARETTE SMOKER (ICD-305.1) - she was smoking 1/2 ppd> states she quit 10/12 & now back to 1/2-1ppd!!! ~  4/15: she admits to still smoking 1/4ppd & encouraged to quit completely...  VENOUS INSUFFICIENCY (ICD-459.81) - she follows a low salt diet, elevates when nec, and wears support hose Prn.  HYPERLIPIDEMIA (ICD-272.4) - hx of hyperlipidemia w/ high HDL's prev... ~  [place] 7/07 showed TChol 210, TG 56, HDL 81, LDL 101 ~  FLP 6/08 showed TChol 232, TG 88, HDL 83, LDL 112 ~  FLP 8/09 showed TChol 199, TG 85, HDL 68, LDL 115 ~  FLP 9/10 (wt=175#) showed TChol 231, TG 78, HDL 94, LDL 120 ~  FLP 11/11 (wt=197#) showed TChol 265, TG 93, HDL 89, LDL 143... needs better diet get wt down. ~  FLP 11/12 (wt=210#) showed TChol 238, TG 79, HDL 83, LDL 148... Offered meds but she wants to see how she does on her wt loss program. ~  FLP 3/13 (wt=229#) showed TChol 235, TG 89, HDL 85, LDL 130 ~  FLP 10/13  (wt=229#) showed TChol 214, TG 114, HDL 60, LDL 119  ~  FLP 4/14 (wt=224#) showed TChol 200, TG 100, HDL 65, LDL 115  GERD (ICD-530.81) - uses OTC Prilosec as needed.  DIVERTICULOSIS OF COLON (ICD-562.10) IRRITABLE BOWEL SYNDROME (ICD-564.1) COLONIC POLYPS (ICD-211.3) Family Hx of COLON CANCER (ICD-153.9) -  ~  colonoscopy 9/06 by Demetra Shiner was WNL- no divertics, no polyps... f/u planned 45yr. ~  Father was diagnosed w/ colon cancer in 2009 & being treated by oncology... pt will need colon f/u in 2011. ~  f/u colonoscopy 9/11 by Marilyn [name] showed divertics & 2 polyps= polypoid mucosa, f/u planned 565yr  Hx of UTI (ICD-599.0)  BREAST CANCER >> Routine mammogram 3/13 showed a lesion on the left breast >> See above:  Surg- [name],  XRT- [name],  Oncology- Marilyn [name]... 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 ~  1/14: she had f/u [facility]> stage 1 invasive ductal carcinoma, s/p lumpectomy & sentinel node bx (neg) 4/13, followed by XRT 6-8/13, w/ Arimidex'1mg'$ /d starting 8/13; doing satis & no changes made... ~  2/14: she had ROV [name]> doing satis, no problems... ~  1/15: she had f/u Marilyn [name]> note reviewed, she was c/o some CTS & hip pain she believed was due to the Arimidex, they decided to HOLD it for 6-8wks & consider other med (eg-Femara/Tamoxifen), she felt better off the med but ultimately went  back on it...  ~  Mammogram & Sonar 4/15 were neg- no evid of malignancy w/ post op changes noted...Marland KitchenMarland Kitchen  DEGENERATIVE JOINT DISEASE (ICD-715.90) - she has mild DJD esp in knees & prev had cortisone shots from ortho- DrMcKinley... not on regular meds, she uses Tylenol Prn... ~  Oncology reported a BMD done by her GYN 4/13 w/ TScore -1.1 in left FemNeck... ~  4/15: follow up BMD has been ordered by Oncology, but not yet performed...  VITAMIN D DEFICIENCY (ICD-268.9) - followed by DrNeal & currently taking Vit D 1000 u caps- 2 daily. ~  Labs here 11/12 showed Vit D level = 37 ~  Labs 10/13 showed Vit D level = 26 ~  Labs 1/14 showed Vit D level = 44  HEADACHE (ICD-784.0)  Hx Panic Disorder> Improved w/o attacks in several yrs; she has ALPRAZOLAM0.'5mg'$  for prn use but seldom needs it she says... Hx of DEPRESSION (ICD-311) - husb passed away in May 22, 2003... she sees DrMcKinney Gaffer) on ZOLOFT '100mg'$ /d...  Hx of ANEMIA (ICD-285.9) - Hg chronically in the 11-12 range... ~  labs 9/10 showed Hg= 11.9, MCV= 77, Fe= 76 (21%sat) ~  labs 11/11 showed Hg= 12.0, MCV= 76, Fe= 80 (20%sat) ~  Labs 11/12 showed Hg= 12.1, MCV= 75, Fe=68 (19%sat) ~  Labs 4/13 showed Hg= 11.2 - 12.6 ~  Labs 10/13 showed Hg= 12.2, MCV= 75, Rec to start Fe daily... ~  Labs 4/14 showed Hg= 12.4, MCV= 75, & rec to restart the FeSO4 daily...   Past Surgical History:  Procedure Laterality Date  . BIOPSY BREAST     left breast  as a teenager  benign  . BREAST BIOPSY Left 06/06/2011  . BREAST EXCISIONAL BIOPSY Bilateral   . BREAST LUMPECTOMY Left May 22, 2011  . BREAST SURGERY  07/05/11   left breast lumpectomy with needle loc & axillary sln bx  . BUNIONECTOMY  05-21-2009   bilateral by Dr Little Ishikawa  . COLONOSCOPY     polyp  . cyst removed from right hand  1995  . TONSILLECTOMY  1972  . TOTAL ABDOMINAL HYSTERECTOMY  May 22, 1998   Dr Nori Riis  . TRIGGER FINGER RELEASE  05/22/06   Dr Burney Gauze    Outpatient Encounter Medications as of 02/13/2017   Medication Sig  . busPIRone (BUSPAR) 10 MG tablet Take 1 tablet (10 mg total) by mouth 2 (two) times daily.  . Cholecalciferol 5000 UNITS TABS Take 1 tablet by mouth 3 (three) times a week.  . clobetasol cream (TEMOVATE) 5.10 % Apply 1 application topically 2 (two) times daily.  . Multiple Vitamin (MULTIVITAMIN WITH MINERALS) TABS tablet Take 1 tablet by mouth daily.  . [DISCONTINUED] ALPRAZolam (XANAX) 0.5 MG tablet Take 1/2 to 1 tablet by mouth three times daily as needed for panic attacks only  . [DISCONTINUED] anastrozole (ARIMIDEX) 1 MG tablet TAKE 1 TABLET (1 MG TOTAL) BY MOUTH DAILY.  . [DISCONTINUED] budesonide-formoterol (SYMBICORT) 80-4.5 MCG/ACT inhaler Inhale 2 puffs into the lungs 2 (two) times daily.  . [DISCONTINUED] clonazePAM (KLONOPIN) 0.5 MG tablet Take 1 tablet (0.5 mg total) by mouth 2 (two) times daily as needed for anxiety.   No facility-administered encounter medications on file as of 02/13/2017.     Allergies  Allergen Reactions  . Lexapro [Escitalopram Oxalate]     Felt like zombie  . Penicillins Rash and Other (See Comments)    At injection site.   Immunization History  Administered Date(s) Administered  . Influenza Split 11/28/2010, 11/28/2011, 11/25/2012  . Influenza Whole 11/26/2009  . Influenza,inj,Quad PF,6+ Mos 11/10/2013, 11/24/2014, 11/09/2015, 10/20/2016  . Td 10/12/2008    Current Medications,  Allergies, Past Medical History, Past Surgical History, Family History, and Social History were reviewed in [employer] record.    Review of Systems    The patient notes some DOE- improved from last yr.  She denies fever, chills, sweats, anorexia, fatigue, weakness, malaise, weight loss, sleep disorder, blurring, diplopia, eye irritation, eye discharge, vision loss, eye pain, photophobia, earache, ear discharge, tinnitus, decreased hearing, nasal congestion, nosebleeds, sore throat, hoarseness, chest pain, palpitations, syncope,  orthopnea, PND, peripheral edema, cough, dyspnea at rest, excessive sputum, hemoptysis, wheezing, pleurisy, nausea, vomiting, diarrhea, constipation, change in bowel habits, abdominal pain, melena, hematochezia, jaundice, gas/bloating, indigestion/heartburn, dysphagia, odynophagia, dysuria, hematuria, urinary frequency, urinary hesitancy, nocturia, incontinence, back pain, joint pain, joint swelling, muscle cramps, muscle weakness, stiffness, arthritis, sciatica, restless legs, leg pain at night, leg pain with exertion, rash, itching, dryness, suspicious lesions, paralysis, paresthesias, seizures, tremors, vertigo, transient blindness, frequent falls, frequent headaches, difficulty walking, depression, anxiety, memory loss, confusion, cold intolerance, heat intolerance, polydipsia, polyphagia, polyuria, unusual weight change, abnormal bruising, bleeding, enlarged lymph nodes, urticaria, allergic rash, hay fever, and recurrent infections.     Objective:   Physical Exam     WD, overweight, 62 y/o BF in NAD... GENERAL:  Alert & oriented; pleasant & cooperative... HEENT:  [place]/AT, EOM-wnl, PERRLA, EACs-clear, TMs-wnl, NOSE-clear, THROAT-clear & wnl. NECK:  Supple w/ full ROM; no JVD; normal carotid impulses w/o bruits; no thyromegaly or nodules palpated; no lymphadenopathy. CHEST:  Clear to P & A; w/o wheezing, rales, or rhonchi heard... HEART:  Regular Rhythm; without murmurs/ rubs/ or gallops. ABDOMEN:  Soft & nontender; normal bowel sounds; no organomegaly or masses detected... EXT: without deformities, mild arthritic changes; no varicose veins/ venous insuffic/ or edema. NEURO:  CN's intact; motor testing normal; sensory testing normal; gait normal & balance OK. DERM:  No lesions noted; no rash etc...  RADIOLOGY DATA:  Reviewed in the EPIC EMR & discussed w/ the patient...  LABORATORY DATA:  Reviewed in the EPIC EMR & discussed w/ the patient...   Assessment & Plan:    Smoker, Hx Bronchits, Hx  Sarcoid>  Hx of recurrent AB w/ refractory cough, congestion; she's had ZPak, Depo, Pred taper, Mucinex, Fluids & resolved; Sarcoid in remission, she quit smoking 07/2015... 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 11/09/15>   She is improved on the Klonopin but 0.'5mg'$  is too strong- advised to decr to 1/2 tab bid, NO SMOKING, & ROV 437mo.  03/14/15>   She is now tolerating the Klonopin 0.'5mg'$  bid, must quit all smoking 7 incr her exercise program- discussed in detail... 05/08/16>   Marilyn [name] quit smoking 3 wks ago & hopefully she can maintain (husb still smokes); she's noted some wheezing esp Qhs & the Stiolto is not holding her- we will switch to [id number];  She  also c/o DOE w/ exercise & hills but she is overwt, hasn't really been on diet/ exercise program as we outlined for her & she understands that further improvement requires a lot of hard work on her part!  She was concerned that her symptoms were related to reactivation of her Sarcoid- but CXR & ACE remain normal & she is reassured;  Finally she notes that the [place] works well for her SOB/ anxiety but 0.'5mg'$  Bid makes her too groggy & rec to decr to 1/2 tab Bid as discussed. 08/14/16>   Marilyn [name] is stable from the pulm standpoint- congrats for not smoking, keep up the good work w/ diet & exercise, call for any breathing  problems. 02/13/17>   Marilyn [name] is rec to continue her current med regimen & follow up w/ me in 55mo sooner if needed prn   Ven Insuffic>  Aware- avoid sodium, elevate, support hose...  Hyperlipid>  She doesn't want meds but weight is up substantially & needs to get wt down...  GI> GERD, Divertics, IBS, Polyps>  Stable on Rx, followed by Marilyn [name] & up to date...  BREAST CANCER> followed by DJanice Norrieon Arimidex...  DJD>  She uses OTC analgesics as needed; notes incr symptoms w/ wt gain & hoping for improvement on diet plan...  Hx Depression>  She was followed by psychiatrist prev on Zoloft & on prn Alpraz which she seldom takes + Wellbutrin started by [employer]  Hx Anemia>  Hg stable ~12 w/ sm cells & Fe levels wnl; poss Thalassemia minor, it still wouldn't hurt to take Fe daily supplement...   Patient's Medications  New Prescriptions   BUPROPION (WELLBUTRIN XL) 150 MG 24 HR TABLET    Take 1 tablet (150 mg total) by mouth daily.  Previous Medications   BUSPIRONE (BUSPAR) 10 MG TABLET    Take 1 tablet (10 mg total) by mouth 2 (two) times daily.   CHOLECALCIFEROL 5000 UNITS TABS    Take 1 tablet by mouth 3 (three) times a week.   CLOBETASOL CREAM (TEMOVATE) 0.05 %    Apply 1 application topically 2 (two) times daily.   MULTIPLE VITAMIN (MULTIVITAMIN WITH MINERALS) TABS TABLET    Take 1 tablet by mouth daily.  Modified Medications   Modified Medication Previous Medication   ALPRAZOLAM (XANAX) 0.5 MG TABLET ALPRAZolam (XANAX) 0.5 MG tablet      Take 1/2 to 1 tablet by mouth bid prn anxiety    Take 1/2 to 1 tablet by mouth three times daily as needed for panic attacks only   BUDESONIDE-FORMOTEROL (SYMBICORT) 80-4.5 MCG/ACT INHALER budesonide-formoterol (SYMBICORT) 80-4.5 MCG/ACT inhaler      Inhale 2 puffs into the lungs 2 (two) times daily.    Inhale 2 puffs into the lungs 2 (two) times daily.  Discontinued Medications   ANASTROZOLE (ARIMIDEX) 1 MG TABLET    TAKE 1 TABLET (1 MG TOTAL) BY MOUTH  DAILY.   CLONAZEPAM (KLONOPIN) 0.5 MG TABLET    Take 1 tablet (0.5 mg total) by mouth 2 (two) times daily as needed for anxiety.

## 2017-08-15 ENCOUNTER — Encounter: Payer: Self-pay | Admitting: Pulmonary Disease

## 2017-08-15 LAB — ANGIOTENSIN CONVERTING ENZYME: ANGIOTENSIN-CONVERTING ENZYME: 35 U/L (ref 9–67)

## 2017-08-15 NOTE — Progress Notes (Signed)
Subjective:    Patient ID: Marilyn [name], female    DOB: [birth year], 62 y.o.   MRN: [id number]  HPI 62 y/o BF ex-smoker who quit 07/2015 and followed w/ hx "alveolar" sarcoidosis (exquisitely sens to Pred rx), and recurrent bronchitic infections... Marilyn [name]) remarried & her last name changed to [name]... ~  SEE PREV EPIC NOTES FOR OLDER DATA >>     CXR 10/13 showed normal heart size, atherosclerotic calcif, clear lungs, surg clips within the left breast...  LABS 10/13:  FLP- on diet alone w/ TChol=214 LDL 119;  Chems- wnl;  CBC- ok w/ Hg=12.2 MCV=75 & rec to start on Fe;  TSH=1.65;  VitD=26  LABS 4/14:  FLP- ok on diet alone x LDL=115;  Chems- wnl;  CBC- ok w/ Hg=12.6 MCV=75;  TSH=1.40...   ~  June 23, 2013:  [place] notes some cough, nasal congestion, drainage, clear mucus; she is still smoking ~5cig/d she says & asked to quit completely; we discussed Mucinex, Delsym, Tessalon... She's had a lot of side effects from Arimidex (stiffness, "I feel old", wt gain) and felt better off this med- but now she is back on it per Marilyn [name] et al...  We reviewed the following medical problems during today's office visit >>     Bronchitis, Sarcoid, Smoker> still smoking 1/4-1/2ppd, off inhalers, uses Mucinex prn; notes mild cough, sm amt clear phlegm, no CP, +Sob/Doe w/o change & asked to incr exercise, get wt down...    VI> trace edema; she knows to elim sodium, elev legs, wear support hose, not on diuretics as yet...    Chol> on diet alone; FLP 4/14 shows TChol 200, TG 100, HDL 65, LDL 115    Overweight> she increased from 160#=> 238# due to Zoloft & Arimidex she says; we reviewed diet, exercise, wt reduction strategies...    GI- GERD, Divertics, IBS, Polyp, +FamHx> she continues to do well & denies abd pain, n/v, d/c, blood seen, etc; last colon 2011 & f/u due 27yr Marilyn [name]...    Breast Cancer> stage 1 invasive ductal carcinoma, s/p lumpectomy & sentinel node bx (neg) 4/13, followed  by XRT 6-8/13, w/ Arimidex'1mg'$ /d starting 8/13 per Marilyn [name] & enduring the side effects...    DJD, VitD> off Tramadol, on OTC meds as needed; +Calcium/ VitD1000; she notes some left hand paresthesias- rec wrist splint trial...    Headaches> no recent problems, she uses OTC meds as needed...    Anxiety, Panic> on Alpraz0.'5mg'$  prn & [name]; she is under a lot of stress...    Anemia> CBCs in epic shows Hg ~12 range... We reviewed prob list, meds, xrays and labs> see below for updates >>   CXR 4/15 showed norm heart size, clear lungs, no adenopathy, NAD...  PFT 4/15 showed FVC=3.49 (127%), FEV1=2.45 (112%), %1sec=70, mid-flows=61%predicted...  LABS 1/15-3/15: reviewed...   ~  September 27, 2015:  27 month ROV & pulmonary follow up visit>  Marilyn [name] 04/2015 & her new last name is Marilyn [name]; her PCP is Marilyn [name]...    62y/o BF w/ a remote hx of "alveolar sarcoid" & she has been off Pred since 1992- see below;  She has been a mild cigarette smoker- generally <1/2ppd x yrs w/ ~30 pack-yr smoking hx; she tells me that she quit ~1 month ago!  She notes that was recently diagnosed w/ COPD based on a CXR from 06/2015 showing hyperinflation (otherw clear) per the radiologist;  However she had Spirometry 05/2013 showing superphysiologic numbers and  just some small airways dis w/ mid-flows at 61% predicted;  She has not prev required inhalers, but she was given a trial of Stiolto- 2sp daily from Marilyn [name] & she says she feels some better on this...    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    MRenessaalso admit to quite a bit of stress over the yrs- Father is ill w/ hx colon cancer, prostate cancer, and bladder cancer; one sister passed at 641w/ hx breast cancer, lung cancer, and pancreatic cancer; one sister passed at 67from ?sarcoid, ?MS;  Marilyn [name] has a hx anxiety & panic disorder on prn Alprazolam, also followed by Marilyn [name] at the cancer center for hx breast cancer- on Arimidex...     See problem list above...  EXAM shows Afeb, VSS, O2sat=99% on RA;  Wt=221#, 5'5"Tall, BMI=36-7;  HEENT- neg, mallampati1;  Chest- clear w/o w/r/r;  Heart- RR w/o m/r/g;  Abd- soft, nontender, neg;  Ext- neg w/o c/c/e;  Neuro- intact...  CXR 07/27/15>  Norm heart size, sl hyperinflation, clear lungs, left breast clips and scoliosis, NAD...  FullPFT 09/29/15>  FVC=3.04 (111%), FEV1=2.18 (102%), %1sec=72, mid-flows sl reduced at 70% predicted;  TLC=5.51 (105%), RV=2.60 (125%), RV/TLC=47%;  DLCO=58% predicted;  This is c/w min airflow obstruction mostly in the small airways, some mild air trapping & decr in DLCO...   LABS 06/2015 in Epic> reviewed-- CBC is ok w/ Hg=12.6;  Chems- wnl;  Thyroid- wnl... IMP/PLAN>>  Marilyn [name] has been a modest smoker & quit 137mogo; her CXR report indicates sl hyperinflation (& clear) but her PFT again does not reveal much airflow obstruction, lung volumes show some mild air trapping & curiously her DLCO is mod reduced;  She has a remote hx of Sarcoidosis but at that time she had a very prominent CXR pattern of "alveolar sarcoid" which responded nicely to Pred & a slow taper;  We discussed proceeding w/ Hi-res CT Chest for further eval vs trial of KLONOPIN as a combination relaxer for her symptoms of SOB w/ short term follow up to assertain it's effect on  her breathing;  She would like to try the latter & we will plan recheck in ~6wks...  ~  November 09, 2015:  6wk ROPretty Bayoueports that the Klonopin helped her breathing but 0.28m828mid was too strong- sleepy; she has been under stress w/ family health issues; she denies cough, sput, hemoptysis, CP, palpit, edema; her SOB is much improved but she has had a relapse w/ smoking "I've had a few" she says...     EXAM shows Afeb, VSS, O2sat=98% on RA;  Wt=221#, 5'5"Tall, BMI=36-7;  HEENT- neg, mallampati1;  Chest- clear w/o w/r/r;  Heart- RR w/o m/r/g;  Abd- soft, nontender, neg;  Ext- neg w/o c/c/e;  Neuro- intact... IMP/PLAN>>  Dyspnea> likely related to stress and "chest wall muscle spasm", improved on Klonopin0.'5mg'$ - pt asked to decr to 1/2 tab bid; she is on Stiolto per Marilyn [name]- 2sp daily & we discussed weaning... Given 2017 FLU vaccine today.    Hx Sarcoidosis in remission    Recurrent bronchitic infections    Cig smoker    PFTs w/ decr DLCO    Medical issues>  HL, GERD, Divertics/ IBS/ polyps & +FamHx colon Ca, Hx breast cancer on Arimidex, Hx anemia & Fe defic, Dysthymia on Wellbutrin & Klonopin...  ~  March 13, 2016:  3moROV & Marilyn [name] reports that her SOB is improved w/ the Klonopin- '5mg'$  1 tab bid (tol well),  but she notes a dry cough in the cold weather; she remains on Stiolto 1 puff daily and is still smoking ~4 cig/d;  She is deconditioned and still not exercising!  Prob list as above...   EXAM shows Afeb, VSS, O2sat=98% on RA;  Wt=224#, 5'5"Tall, BMI=37;  HEENT- neg, mallampati1;  Chest- clear w/o w/r/r;  Heart- RR w/o m/r/g;  Abd- soft, nontender, neg;  Ext- neg w/o c/c/e;  Neuro- intact..  IMP/PLAN>>  Marilyn [name] is her own worst enemy- needs to quit all smoking & start an exercise program; we reviewed the specifics of these recommendations & outlined the benefits etc, she...     NOTE: >50% of this 1547m rov was spent in counseling & coordination of care...   ~  May 08, 2016:  47m31moV  & pulmonary/ medical follow up visit>  Marilyn [name] indicates to me that she has quit smoking & her last cig was 3 wks ago! She is hoping to remain quit but husb smokes; she notes some wheezing on & off esp at night & some SOB esp w/ exercise & hills; it appears that her Stiolto is not holding her & we discussed change to Symbicort160-2spBid; she is still taking the Klonopin 0.'5mg'$  bid but notes too groggy & again reminded to decr to 1/2 tab Bid; she's been exercising by walking & has a home gym but needs to incr the intensity of exercise, & consider weight watchers; we reviewed the following medical problems during today's office visit >>     Dyspnea> likely related to stress and "chest wall muscle spasm", improved on Klonopin0.'5mg'$ - pt asked to decr to 1/2 tab bid.    Hx Sarcoidosis in remission    Recurrent bronchitic infections    Ex-Cig smoker    PFTs w/ decr DLCO    Medical issues>  HL, GERD, Divertics/ IBS/ polyps & +FamHx colon Ca, Hx breast cancer on Arimidex, Hx anemia & Fe defic, Dysthymia on Wellbutrin & Klonopin... EXAM shows Afeb, VSS, O2sat=97% on RA;  Wt=223#, 5'5"Tall, BMI=37;  HEENT- neg, mallampati1;  Chest- clear w/o w/r/r;  Heart- RR w/o m/r/g;  Abd- soft, nontender, neg;  Ext- VI, tr edema no c/c;  Neuro- intact..   CXR 05/08/16 (independently reviewed by me in the PACS system)> norm heart size, atherosclerotic ao, mild hyperinflation & peribronchial thickening, otherw clear- NAD; clips in left breast area & osteopenia...   LABS 05/08/16>  ACE level = 29 IMP/PLAN>>  Marilyn [name] quit smoking 3 wks ago & hopefully she can maintain (husb still smokes); she's noted some wheezing esp Qhs & the Stiolto is not holding her- we will switch to SYMBICORT160-2spBid;  She also c/o DOE w/ exercise & hills but she is overwt, hasn't really been on diet/ exercise program as we outlined for her & she understands  that further improvement requires a lot of hard work on her part!  She was concerned that her symptoms were  related to reactivation of her Sarcoid- but CXR & ACE remain normal & she is reassured;  Finally she notes that the Parke works well for her SOB/ anxiety but 0.'5mg'$  Bid makes her too groggy & rec to decr to 1/2 tab Bid as discussed... we plan rov in 25mo  ~  August 14, 2016:  334moOV & in March Pt had indicated that she quit smoking but was worried because husb still smokes in the house- she indicates that she has remained quit so far, breathing is improved using Symbicort80-2spBid & not using the Klonopin as often; she denies wheezing, DOE is improved, Klonopin still helps when needed;  For exercise she is doing yard work, walking, etc...  We reviewed the following interval Epic notes>      She saw Ophthalmology- DrHecker 04/12/16>  Glaucoma suspect- no progression, bilat early cats, dry eye syndrome, allergic conjunctivitis, myopia- wears contacts...     [facility]>  Note reviewed- c/o 1wk hx cramping/ nausea, diarrhea, hx hyst (no other surgeries), afeb w/ benign exam, Labs showed Hg=13.4, mcv=71, WBC=10.8, Chems- wnl, UA- clear, Lipase wnl;  CT Abd&Pelvis showed min divertics w/o inflamm, 2cm cyst left kidney, s/p hyst, mild Ao atherosclerosis, no explanation for her abd discomfort... Rec to take Omep20 & f/u w/ PCP...    She saw PCP- Marilyn [name] 07/28/16>  Annual exam- said she had gastroenteritis which resolved, labs & CT reviewed, no change in her meds, advised to lose wt & try tumeric for pain... We reviewed the following medical problems during today's office visit>      Dyspnea> likely related to stress and "chest wall muscle spasm", improved on Klonopin0.'5mg'$ - taking 1/2 tab bid prn; note: she doesn't need both Klonopin & Xanax...    Hx Sarcoidosis in remission- last CXR 04/2016    Recurrent bronchitic infections    Ex-Cig smoker    PFTs w/ decr DLCO    Medical issues>  HL, GERD, Divertics/ IBS/ polyps & +FamHx colon Ca, Hx breast cancer on Arimidex, Hx anemia & Fe  defic, Dysthymia on Wellbutrin & Klonopin... EXAM shows Afeb, VSS, O2sat=98% on RA;  Wt=214#, 5'5"Tall, BMI=36;  HEENT- neg, mallampati1;  Chest- clear w/o w/r/r;  Heart- RR w/o m/r/g;  Abd- soft, nontender, neg;  Ext- VI, tr edema no c/c;  Neuro- intact..  IMP/PLAN>>  Marilyn [name] is stable from the pulm standpoint- congrats for not smoking, keep up the good work w/ diet & exercise, call for any breathing problems...   ~  February 13, 2017:  57m3moV & unfortunately MarLevy Sjogrens restarted smoking & back up to 1/2ppd- notes sl incr SOB, taking Klonopin 0.'5mg'$ - 1/2 tab bid & this helps;  PCP placed her on Buspar for panic disorder & prn Xanax- I explained that she doesn't need both Klonopin & Xanax;  She notes sl cough, small amtclear sput w/o blood etc (uses Mucinex prn); she has some left CWP w/ lifting- advised rx w/ rib binder/ heat/ etc... Weight stable, she's been sl less active & advised on diet/ exercise/ wt reduction strategies...  We reviewed the following interval Epic notes>      She PCP team- DrJSchmitz on 09/20/16>  C/o panic attacks, wanted rescue inhaler due to incr dyspnea; noted Buspar helped her panic attacks but she was off this med- restarted by DrSchmitz;  She saw ONCOLOGY- Marilyn [name] on 11/23/16>  Surveillance on anastrozole=> planned to stop 01/2017 Malignant neoplasm of upper-outer quadrant of left breast in female, estrogen receptor positive ([place])   06/20/2011 Surgery    Left breast lumpectomy with sentinel lymph node biopsy: 1 cm IDC, grade 1, ER positive, PR positive, HER-2 negative, Ki-67 6%    08/17/2011 - 09/28/2011 Radiation Therapy-- Adjuvant radiation therapy   10/02/2011 - 11/23/2016 Anti-estrogen oral therapy-- Arimidex 1 mg daily started 10/02/2011 stopped March 2015 for pain and restarted August 2015     She saw PCP- Marilyn [name] 01/29/17>  Routine follow up visit, felt to be stable, no changes made in her meds...  We reviewed the following medical problems during today's office  visit>      Dyspnea> likely related to stress and "chest wall muscle spasm", improved on Klonopin0.'5mg'$ - taking 1/2 tab bid prn; note: she doesn't need both Klonopin & Xanax...    Hx Sarcoidosis in remission- last CXR 04/2016    Recurrent bronchitic infections    Recurrent smoker>  We discussed options w/ current anxiolytics, nicotine replacement, or Chantix (she will let me know if she wants to try this latter).    PFTs 09/2015 w/ decr DLCO (but normal airflow w/o obstructive dis)...    Medical issues>  HL, GERD, Divertics/ IBS/ polyps & +FamHx colon Ca, Hx breast cancer on Arimidex, Hx anemia & Fe defic, Dysthymia on Wellbutrin & Klonopin... EXAM shows Afeb, VSS, O2sat=98% on RA;  Wt=217#, 5'5"Tall, BMI=36;  HEENT- neg, mallampati1;  Chest- clear w/o w/r/r;  Heart- RR w/o m/r/g;  Abd- soft, nontender, neg;  Ext- VI, tr edema no c/c;  Neuro- intact..  IMP/PLAN>>  Marilyn [name] is rec to continue her current med regimen & follow up w/ me in 70mo sooner if needed prn...   ~  August 14, 2017:  690moOV                Problem List:  PHYSICAL EXAMINATION (ICD-V70.0) - GYN= DrNeal who does her PAP smears, Mammograms (4/14- neg), and BMD... he also checked her VitD level and started VitD 50K weekly, then changed her to 2000 u daily... she is s/p hysterectomy in 2000... Note- she had an abd bruit investigated w/ an Abd MRA 6/08 and nothing found...  [facility] Routine Mammogram 3/13 showed left breast lesion leading to surg by DrHoxworth, XRT by [name], Oncology f/u w/ DrJanice [name].   Hx of BRONCHITIS (ICD-490) - no recent episodes... Min cough & clear sputum... ~  Baseline CXR is clear and WNL (no residuals from her sarcoidosis) ~  Spirometry 7/06 showed FVC= 3.77 (130%), FEV1= 2.71 (113%), %1sec=72, mid-flows=66%... ~  CXR 11/12 showed normal heart size, min scarring, otherw clear, NAD...Marland Kitchen ~  CXR 10/13 showed normal heart size, atherosclerotic calcif, clear lungs,  surg clips within the left breast... ~  1/14: she presented w/ refractory cough/ AB- given Depo, Pred taper, & the usual OTC meds... ~  CXR 2/14 showed norm heart size, clear lungs, mild peribronch thickening, surg clips overlying left breast...  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 totally resolved on Pred therapy w/ normalization of CXR and no recurrence off Pred now since 1992... ~  CXR 9/10 remains clear & WNL...Marland Kitchen~  CXR 11/11 remains clear & WNL...Marland Kitchen~  CXR 11/12 remains clear, mild basilar atx, otherw WNL...Marland KitchenMarland Kitchen  EKG 11/12 showed NSR, rate62, wnl, NAD.Marland Kitchen. ~  CXR 10/13 showed normal heart size, atherosclerotic calcif, clear lungs, surg clips within the left breast... ~  CXR 2/14 showed norm heart size, clear lungs, mild peribronch thickening, surg clips overlying left breast... ~  CXR 4/15 showed norm heart size, clear lungs, no adenopathy, NAD... ~  PFT 4/15 showed FVC=3.49 (127%), FEV1=2.45 (112%), %1sec=70, mid-flows=61%predicted...  CIGARETTE SMOKER (ICD-305.1) - she was smoking 1/2 ppd> states she quit 10/12 & now back to 1/2-1ppd!!! ~  4/15: she admits to still smoking 1/4ppd & encouraged to quit completely...  VENOUS INSUFFICIENCY (ICD-459.81) - she follows a low salt diet, elevates when nec, and wears support hose Prn.  HYPERLIPIDEMIA (ICD-272.4) - hx of hyperlipidemia w/ high HDL's prev... ~  [place] 7/07 showed TChol 210, TG 56, HDL 81, LDL 101 ~  FLP 6/08 showed TChol 232, TG 88, HDL 83, LDL 112 ~  FLP 8/09 showed TChol 199, TG 85, HDL 68, LDL 115 ~  FLP 9/10 (wt=175#) showed TChol 231, TG 78, HDL 94, LDL 120 ~  FLP 11/11 (wt=197#) showed TChol 265, TG 93, HDL 89, LDL 143... needs better diet get wt down. ~  FLP 11/12 (wt=210#) showed TChol 238, TG 79, HDL 83, LDL 148... Offered meds but she wants to see how she does on her wt loss program. ~  FLP 3/13 (wt=229#) showed TChol 235, TG 89, HDL 85, LDL  130 ~  FLP 10/13 (wt=229#) showed TChol 214, TG 114, HDL 60, LDL 119  ~  FLP 4/14 (wt=224#) showed TChol 200, TG 100, HDL 65, LDL 115  GERD (ICD-530.81) - uses OTC Prilosec as needed.  DIVERTICULOSIS OF COLON (ICD-562.10) IRRITABLE BOWEL SYNDROME (ICD-564.1) COLONIC POLYPS (ICD-211.3) Family Hx of COLON CANCER (ICD-153.9) -  ~  colonoscopy 9/06 by Demetra Shiner was WNL- no divertics, no polyps... f/u planned 69yr. ~  Father was diagnosed w/ colon cancer in 2009 & being treated by oncology... pt will need colon f/u in 2011. ~  f/u colonoscopy 9/11 by Marilyn [name] showed divertics & 2 polyps= polypoid mucosa, f/u planned 553yr  Hx of UTI (ICD-599.0)  BREAST CANCER >> Routine mammogram 3/13 showed a lesion on the left breast >> See above:  Surg- [name],  XRT- [name],  Oncology- Marilyn [name]... 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 ~  1/14: she had f/u [facility]> stage 1 invasive ductal carcinoma, s/p lumpectomy & sentinel node bx (neg) 4/13, followed by XRT 6-8/13, w/ Arimidex'1mg'$ /d starting 8/13; doing satis & no changes made... ~  2/14: she had ROV [name]> doing satis, no problems... ~  1/15: she had f/u Marilyn [name]> note reviewed, she was c/o some CTS & hip pain she believed was due to the Arimidex, they decided to HOLD it for 6-8wks & consider other med (eg-Femara/Tamoxifen), she felt better off the med  but ultimately went back on it...  ~  Mammogram &  [place] 4/15 were neg- no evid of malignancy w/ post op changes noted...  DEGENERATIVE JOINT DISEASE (ICD-715.90) - she has mild DJD esp in knees & prev had cortisone shots from ortho- DrMcKinley... not on regular meds, she uses Tylenol Prn... ~  Oncology reported a BMD done by her GYN 4/13 w/ TScore -1.1 in left FemNeck... ~  4/15: follow up BMD has been ordered by Oncology, but not yet performed...  VITAMIN D DEFICIENCY (ICD-268.9) - followed by DrNeal & currently taking Vit D 1000 u caps- 2 daily. ~  Labs here 11/12 showed Vit D level = 37 ~  Labs 10/13 showed Vit D level = 26 ~  Labs 1/14 showed Vit D level = 44  HEADACHE (ICD-784.0)  Hx Panic Disorder> Improved w/o attacks in several yrs; she has ALPRAZOLAM0.'5mg'$  for prn use but seldom needs it she says... Hx of DEPRESSION (ICD-311) - husb passed away in Jun 03, 2003... she sees DrMcKinney Gaffer) on ZOLOFT '100mg'$ /d...  Hx of ANEMIA (ICD-285.9) - Hg chronically in the 11-12 range... ~  labs 9/10 showed Hg= 11.9, MCV= 77, Fe= 76 (21%sat) ~  labs 11/11 showed Hg= 12.0, MCV= 76, Fe= 80 (20%sat) ~  Labs 11/12 showed Hg= 12.1, MCV= 75, Fe=68 (19%sat) ~  Labs 4/13 showed Hg= 11.2 - 12.6 ~  Labs 10/13 showed Hg= 12.2, MCV= 75, Rec to start Fe daily... ~  Labs 4/14 showed Hg= 12.4, MCV= 75, & rec to restart the FeSO4 daily...   Past Surgical History:  Procedure Laterality Date  . BIOPSY BREAST     left breast  as a teenager  benign  . BREAST BIOPSY Left 06/06/2011  . BREAST EXCISIONAL BIOPSY Bilateral   . BREAST LUMPECTOMY Left 03-Jun-2011  . BREAST SURGERY  07/05/11   left breast lumpectomy with needle loc & axillary sln bx  . BUNIONECTOMY  06/02/09   bilateral by Dr Little Ishikawa  . COLONOSCOPY     polyp  . cyst removed from right hand  1995  . TONSILLECTOMY  1972  . TOTAL ABDOMINAL HYSTERECTOMY  06/03/98   Dr Nori Riis  . TRIGGER FINGER RELEASE  2006/06/03   Dr Burney Gauze    Outpatient Encounter Medications  as of 08/14/2017  Medication Sig  . ALPRAZolam (XANAX) 0.5 MG tablet Take 1/2 to 1 tablet by mouth bid prn anxiety  . budesonide-formoterol (SYMBICORT) 80-4.5 MCG/ACT inhaler Inhale 2 puffs into the lungs 2 (two) times daily.  . busPIRone (BUSPAR) 10 MG tablet Take 1 tablet (10 mg total) by mouth 2 (two) times daily.  . Cholecalciferol 5000 UNITS TABS Take 1 tablet by mouth 3 (three) times a week.  . clobetasol cream (TEMOVATE) 0.99 % Apply 1 application topically 2 (two) times daily.  . Multiple Vitamin (MULTIVITAMIN WITH MINERALS) TABS tablet Take 1 tablet by mouth daily.  Marland Kitchen buPROPion (WELLBUTRIN XL) 150 MG 24 hr tablet Take 1 tablet (150 mg total) by mouth daily. (Patient not taking: Reported on 08/14/2017)  . clonazePAM (KLONOPIN) 0.5 MG tablet 1/2 to 1 tab twice a day as needed   No facility-administered encounter medications on file as of 08/14/2017.     Allergies  Allergen Reactions  . Lexapro [Escitalopram Oxalate]     Felt like zombie  . Penicillins Rash and Other (See Comments)    At injection site.   Immunization History  Administered Date(s) Administered  . Influenza Split 11/28/2010, 11/28/2011, 11/25/2012  . Influenza Whole 11/26/2009  . Influenza,inj,Quad PF,6+ Mos 11/10/2013, 11/24/2014, 11/09/2015, 10/20/2016  . Td  10/12/2008    Current Medications, Allergies, Past Medical History, Past Surgical History, Family History, and Social History were reviewed in [employer] record.    Review of Systems    The patient notes some DOE- improved from last yr.  She denies fever, chills, sweats, anorexia, fatigue, weakness, malaise, weight loss, sleep disorder, blurring, diplopia, eye irritation, eye discharge, vision loss, eye pain, photophobia, earache, ear discharge, tinnitus, decreased hearing, nasal congestion, nosebleeds, sore throat, hoarseness, chest pain, palpitations, syncope, orthopnea, PND, peripheral edema, cough, dyspnea at rest, excessive  sputum, hemoptysis, wheezing, pleurisy, nausea, vomiting, diarrhea, constipation, change in bowel habits, abdominal pain, melena, hematochezia, jaundice, gas/bloating, indigestion/heartburn, dysphagia, odynophagia, dysuria, hematuria, urinary frequency, urinary hesitancy, nocturia, incontinence, back pain, joint pain, joint swelling, muscle cramps, muscle weakness, stiffness, arthritis, sciatica, restless legs, leg pain at night, leg pain with exertion, rash, itching, dryness, suspicious lesions, paralysis, paresthesias, seizures, tremors, vertigo, transient blindness, frequent falls, frequent headaches, difficulty walking, depression, anxiety, memory loss, confusion, cold intolerance, heat intolerance, polydipsia, polyphagia, polyuria, unusual weight change, abnormal bruising, bleeding, enlarged lymph nodes, urticaria, allergic rash, hay fever, and recurrent infections.     Objective:   Physical Exam     WD, overweight, 62 y/o BF in NAD... GENERAL:  Alert & oriented; pleasant & cooperative... HEENT:  [place]/AT, EOM-wnl, PERRLA, EACs-clear, TMs-wnl, NOSE-clear, THROAT-clear & wnl. NECK:  Supple w/ full ROM; no JVD; normal carotid impulses w/o bruits; no thyromegaly or nodules palpated; no lymphadenopathy. CHEST:  Clear to P & A; w/o wheezing, rales, or rhonchi heard... HEART:  Regular Rhythm; without murmurs/ rubs/ or gallops. ABDOMEN:  Soft & nontender; normal bowel sounds; no organomegaly or masses detected... EXT: without deformities, mild arthritic changes; no varicose veins/ venous insuffic/ or edema. NEURO:  CN's intact; motor testing normal; sensory testing normal; gait normal & balance OK. DERM:  No lesions noted; no rash etc...  RADIOLOGY DATA:  Reviewed in the EPIC EMR & discussed w/ the patient...  LABORATORY DATA:  Reviewed in the EPIC EMR & discussed w/ the patient...   Assessment & Plan:    Smoker, Hx Bronchits, Hx Sarcoid>  Hx of recurrent AB w/ refractory cough, congestion; she's  had ZPak, Depo, Pred taper, Mucinex, Fluids & resolved; Sarcoid in remission, she quit smoking 07/2015... 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 11/09/15>   She is improved on the Klonopin but 0.'5mg'$  is too strong- advised to decr to 1/2 tab bid, NO SMOKING, & ROV 48mo.  03/14/15>   She is now tolerating the Klonopin 0.'5mg'$  bid, must quit all smoking 7 incr her exercise program- discussed in detail... 05/08/16>   Marilyn [name] quit smoking 3 wks ago & hopefully she can maintain (husb still smokes); she's noted some wheezing esp Qhs & the Stiolto is not holding her- we  will switch to [id number];  She also c/o DOE w/ exercise & hills but she is overwt, hasn't really been on diet/ exercise program as we outlined for her & she understands that further improvement requires a lot of hard work on her part!  She was concerned that her symptoms were related to reactivation of her Sarcoid- but CXR & ACE remain normal & she is reassured;  Finally she notes that the [place] works well for her SOB/ anxiety but 0.'5mg'$  Bid makes her too groggy & rec to decr to 1/2 tab Bid as discussed. 08/14/16>   Marilyn [name] is stable from the pulm standpoint- congrats for not smoking, keep up the good work w/ diet & exercise, call for any breathing problems. 02/13/17>   Marilyn [name] is rec to continue her current med regimen & follow up w/  me in 46mo sooner if needed prn   Ven Insuffic>  Aware- avoid sodium, elevate, support hose...  Hyperlipid>  She doesn't want meds but weight is up substantially & needs to get wt down...  GI> GERD, Divertics, IBS, Polyps>  Stable on Rx, followed by Marilyn [name] & up to date...  BREAST CANCER> followed by DJanice Norrieon Arimidex...  DJD>  She uses OTC analgesics as needed; notes incr symptoms w/ wt gain & hoping for improvement on diet plan...  Hx Depression>  She was followed by psychiatrist prev on Zoloft & on prn Alpraz which she seldom takes + Wellbutrin started by [employer]  Hx Anemia>  Hg stable ~12 w/ sm cells & Fe levels wnl; poss Thalassemia minor, it still wouldn't hurt to take Fe daily supplement...   Patient's Medications  New Prescriptions   CLONAZEPAM (KLONOPIN) 0.5 MG TABLET    1/2 to 1 tab twice a day as needed  Previous Medications   ALPRAZOLAM (XANAX) 0.5 MG TABLET    Take 1/2 to 1 tablet by mouth bid prn anxiety   BUDESONIDE-FORMOTEROL (SYMBICORT) 80-4.5 MCG/ACT INHALER    Inhale 2 puffs into the lungs 2 (two) times daily.   BUPROPION (WELLBUTRIN XL) 150 MG 24 HR TABLET    Take 1 tablet (150 mg total) by mouth daily.   BUSPIRONE (BUSPAR) 10 MG TABLET    Take 1 tablet (10 mg total) by mouth 2 (two) times daily.   CHOLECALCIFEROL 5000 UNITS TABS    Take 1 tablet by mouth 3 (three) times a week.   CLOBETASOL CREAM (TEMOVATE) 0.05 %    Apply 1 application topically 2 (two) times daily.   MULTIPLE VITAMIN (MULTIVITAMIN WITH MINERALS) TABS TABLET    Take 1 tablet by mouth daily.  Modified Medications   No medications on file  Discontinued Medications   No medications on file

## 2017-08-22 ENCOUNTER — Encounter (HOSPITAL_COMMUNITY): Payer: Self-pay

## 2017-08-22 ENCOUNTER — Ambulatory Visit (HOSPITAL_COMMUNITY)
Admission: EM | Admit: 2017-08-22 | Discharge: 2017-08-22 | Disposition: A | Discharge status: Home / Self Care | Payer: No Typology Code available for payment source | Attending: Family Medicine | Admitting: Family Medicine

## 2017-08-22 DIAGNOSIS — S30860A Insect bite (nonvenomous) of lower back and pelvis, initial encounter: Secondary | ICD-10-CM

## 2017-08-22 DIAGNOSIS — W57XXXA Bitten or stung by nonvenomous insect and other nonvenomous arthropods, initial encounter: Secondary | ICD-10-CM

## 2017-08-22 MED ORDER — PREDNISONE 10 MG (21) PO TBPK: ORAL_TABLET | Freq: Every day | ORAL | 0 refills | Status: DC

## 2017-08-22 NOTE — ED Provider Notes (Signed)
[facility]   [id number] 08/22/17 Arrival Time: 1509  ASSESSMENT & PLAN:  1. Insect bite of lower back, initial encounter    Meds ordered this encounter  Medications  . predniSONE (STERAPRED UNI-PAK 21 TAB) 10 MG (21) TBPK tablet    Sig: Take by mouth daily. Take as directed.    Dispense:  21 tablet    Refill:  0   Benadryl if needed. No sign of infection. Reassured.  Will follow up with PCP or here if worsening or failing to improve as anticipated. Reviewed expectations re: course of current medical issues. Questions answered. Outlined signs and symptoms indicating need for more acute intervention. Patient verbalized understanding. After Visit Summary given.   SUBJECTIVE:  Marilyn [name] is a 62 y.o. female who presents with a skin complaint.   Location: R lower back erythematous area Onset: abrupt Duration: 1 day Pruritic? Yes Painful? No Progression: increasing steadily  Drainage? No  Known trigger? Questions insect bite or sting. Other associated symptoms: none Therapies tried thus far: none Denies fever. No specific aggravating or alleviating factors reported.   ROS: As per HPI.  OBJECTIVE: Vitals:   08/22/17 1530  BP: 140/82  Pulse: 79  Resp: 17  Temp: 97.7 F (36.5 C)  TempSrc: Temporal  SpO2: 100%    General appearance: alert; no distress Extremities: no edema Skin: warm and dry; single wheal measuring approx 6x4 cm on lower R back; no stinger; no fluctuance; warm to touch; tender to touch Psychological: alert and cooperative; normal mood and affect  Allergies  Allergen Reactions  . Lexapro [Escitalopram Oxalate]     Felt like zombie  . Penicillins Rash and Other (See Comments)    At injection site.    Past Medical History:  Diagnosis Date  . Allergy   . Anemia    hx  . Anxiety    panic disorder  . Breast cancer ([place]) 06/06/2011   bc  left breast 3 o'clock dx=invasive ductal ca uoqER/PR=positive  . Cancer ([place])    BREAST - left  . Cigarette smoker   . Colon polyp   . Complication of anesthesia    DIFFICULTY AWAKENING  . Depression   . Diverticulosis of colon   . DJD (degenerative joint disease)   . DJD (degenerative joint disease)   . GERD (gastroesophageal reflux disease)    Pt. denies having GERD. Unable to remove it.  Marland Kitchen Headache(784.0)   . History of anemia   . History of bronchitis   . History of sarcoidosis   . Hyperlipidemia    no meds needed  . Incisional breast wound    AT AGE 77 BILATERAL INCISION TO BREAST MADE  . Leg swelling   . Panic disorder   . Personal history of radiation therapy   . S/P radiation therapy 08/17/11 - 09/28/11   LLQ - 50 Gy/25 Fractions with Boost of 10 Gy / 5 fractions  . Sialoadenitis of submandibular gland 2016  . Vitamin D deficiency    Social History   Socioeconomic History  . Marital status: Married    Spouse name: Not on file  . Number of children: 2  . Years of education: Not on file  . Highest education level: Not on file  Occupational History  . Occupation: Tour manager  Social Needs  . Financial resource strain: Not on file  . Food insecurity:    Worry: Not on file    Inability: Not on file  . Transportation needs:  Medical: Not on file    Non-medical: Not on file  Tobacco Use  . Smoking status: Current Every Day Smoker    Packs/day: 0.50    Types: Cigarettes    Last attempt to quit: 04/16/2012    Years since quitting: 5.3  . Smokeless tobacco: Never Used  Substance and Sexual Activity  . Alcohol use: Yes    Alcohol/week: 0.0 - 0.6 oz    Comment: social  . Drug use: No  . Sexual activity: Not Currently    Birth control/protection: Surgical    Comment: menses age 58,hrt started  06-22-98  Lifestyle  . Physical activity:    Days per week: Not on file    Minutes per session: Not on file  . Stress: Not on file  Relationships  . Social connections:    Talks on phone: Not on file    Gets together: Not on file    Attends  religious service: Not on file    Active member of club or organization: Not on file    Attends meetings of clubs or organizations: Not on file    Relationship status: Not on file  . Intimate partner violence:    Fear of current or ex partner: Not on file    Emotionally abused: Not on file    Physically abused: Not on file    Forced sexual activity: Not on file  Other Topics Concern  . Not on file  Social History Narrative   Widowed - husband died 2003-06-22   2 step-children   Exercises 3x per week   2 cups of caffeine daily         Family History  Problem Relation Age of Onset  . Anemia Mother   . Other Mother        + H Pylori  . Prostate cancer Father   . Colon cancer Father        diagnosed 06-22-2007  . Cancer Father        colon and prostate cancer  . Breast cancer Sister        #1  . Cancer Sister 30       breast cancer, lung, pancreatic  . Sarcoidosis Sister        #2  . Multiple sclerosis Sister        #2  . Hypertension Brother        #1  . Cancer Brother        prostate cancer  . Prostate cancer Brother        #1  . Hyperlipidemia Brother        #1  . Cancer Cousin        breast cancer  . Esophageal cancer Neg Hx   . Stomach cancer Neg Hx   . Rectal cancer Neg Hx    Past Surgical History:  Procedure Laterality Date  . BIOPSY BREAST     left breast  as a teenager  benign  . BREAST BIOPSY Left 06/06/2011  . BREAST EXCISIONAL BIOPSY Bilateral   . BREAST LUMPECTOMY Left June 22, 2011  . BREAST SURGERY  07/05/11   left breast lumpectomy with needle loc & axillary sln bx  . BUNIONECTOMY  21-Jun-2009   bilateral by Dr Little Ishikawa  . COLONOSCOPY     polyp  . cyst removed from right hand  1995  . TONSILLECTOMY  1972  . TOTAL ABDOMINAL HYSTERECTOMY  06/22/98   Dr Nori Riis  . TRIGGER FINGER RELEASE  Jun 22, 2006   Dr  Donnal Debar, MD 08/22/17 [place]

## 2017-08-22 NOTE — ED Triage Notes (Signed)
Pt states some insect bit her last as she was getting in her car in her right back area.

## 2017-09-03 ENCOUNTER — Other Ambulatory Visit (INDEPENDENT_AMBULATORY_CARE_PROVIDER_SITE_OTHER): Payer: No Typology Code available for payment source

## 2017-09-03 DIAGNOSIS — Z0000 Encounter for general adult medical examination without abnormal findings: Secondary | ICD-10-CM | POA: Diagnosis not present

## 2017-09-03 LAB — CBC WITH DIFFERENTIAL/PLATELET
BASOS PCT: 0.6 % (ref 0.0–3.0)
Basophils Absolute: 0 10*3/uL (ref 0.0–0.1)
EOS PCT: 2.4 % (ref 0.0–5.0)
Eosinophils Absolute: 0.2 10*3/uL (ref 0.0–0.7)
HCT: 40.1 % (ref 36.0–46.0)
Hemoglobin: 13 g/dL (ref 12.0–15.0)
LYMPHS ABS: 1.7 10*3/uL (ref 0.7–4.0)
Lymphocytes Relative: 23.4 % (ref 12.0–46.0)
MCHC: 32.4 g/dL (ref 30.0–36.0)
MCV: 74 fl — ABNORMAL LOW (ref 78.0–100.0)
MONO ABS: 0.6 10*3/uL (ref 0.1–1.0)
Monocytes Relative: 8.3 % (ref 3.0–12.0)
NEUTROS ABS: 4.8 10*3/uL (ref 1.4–7.7)
NEUTROS PCT: 65.3 % (ref 43.0–77.0)
PLATELETS: 218 10*3/uL (ref 150.0–400.0)
RBC: 5.42 Mil/uL — ABNORMAL HIGH (ref 3.87–5.11)
RDW: 14.7 % (ref 11.5–15.5)
WBC: 7.4 10*3/uL (ref 4.0–10.5)

## 2017-09-03 LAB — URINALYSIS
BILIRUBIN URINE: NEGATIVE
KETONES UR: NEGATIVE
Leukocytes, UA: NEGATIVE
Nitrite: NEGATIVE
Specific Gravity, Urine: 1.025 (ref 1.000–1.030)
TOTAL PROTEIN, URINE-UPE24: NEGATIVE
URINE GLUCOSE: NEGATIVE
Urobilinogen, UA: 0.2 (ref 0.0–1.0)
pH: 6 (ref 5.0–8.0)

## 2017-09-03 LAB — LIPID PANEL
CHOL/HDL RATIO: 3
Cholesterol: 210 mg/dL — ABNORMAL HIGH (ref 0–200)
HDL: 66.4 mg/dL (ref >39.00)
LDL CALC: 122 mg/dL — AB (ref 0–99)
NONHDL: 143.46
Triglycerides: 105 mg/dL (ref 0.0–149.0)
VLDL: 21 mg/dL (ref 0.0–40.0)

## 2017-09-03 LAB — HEPATIC FUNCTION PANEL
ALT: 13 U/L (ref 0–35)
AST: 11 U/L (ref 0–37)
Albumin: 3.9 g/dL (ref 3.5–5.2)
Alkaline Phosphatase: 74 U/L (ref 39–117)
Bilirubin, Direct: 0 mg/dL (ref 0.0–0.3)
Total Bilirubin: 0.4 mg/dL (ref 0.2–1.2)
Total Protein: 6.7 g/dL (ref 6.0–8.3)

## 2017-09-03 LAB — BASIC METABOLIC PANEL
BUN: 10 mg/dL (ref 6–23)
CHLORIDE: 106 meq/L (ref 96–112)
CO2: 24 meq/L (ref 19–32)
Calcium: 9.5 mg/dL (ref 8.4–10.5)
Creatinine, Ser: 0.71 mg/dL (ref 0.40–1.20)
GFR: 106.75 mL/min (ref >60.00)
GLUCOSE: 98 mg/dL (ref 70–99)
POTASSIUM: 4.1 meq/L (ref 3.5–5.1)
SODIUM: 138 meq/L (ref 135–145)

## 2017-09-03 LAB — TSH: TSH: 1.41 u[IU]/mL (ref 0.35–4.50)

## 2017-09-13 ENCOUNTER — Ambulatory Visit (INDEPENDENT_AMBULATORY_CARE_PROVIDER_SITE_OTHER): Payer: No Typology Code available for payment source | Admitting: Internal Medicine

## 2017-09-13 ENCOUNTER — Encounter: Payer: Self-pay | Admitting: Internal Medicine

## 2017-09-13 VITALS — BP 122/70 | HR 77 | Temp 98.5°F | Ht 65.0 in | Wt 217.0 lb

## 2017-09-13 DIAGNOSIS — F419 Anxiety disorder, unspecified: Secondary | ICD-10-CM

## 2017-09-13 DIAGNOSIS — Z23 Encounter for immunization: Secondary | ICD-10-CM | POA: Diagnosis not present

## 2017-09-13 DIAGNOSIS — F4323 Adjustment disorder with mixed anxiety and depressed mood: Secondary | ICD-10-CM | POA: Diagnosis not present

## 2017-09-13 DIAGNOSIS — Z0000 Encounter for general adult medical examination without abnormal findings: Secondary | ICD-10-CM

## 2017-09-13 DIAGNOSIS — E559 Vitamin D deficiency, unspecified: Secondary | ICD-10-CM

## 2017-09-13 DIAGNOSIS — F17200 Nicotine dependence, unspecified, uncomplicated: Secondary | ICD-10-CM

## 2017-09-13 DIAGNOSIS — Z6836 Body mass index (BMI) 36.0-36.9, adult: Secondary | ICD-10-CM

## 2017-09-13 MED ORDER — BUSPIRONE HCL 10 MG PO TABS: 10.0000 mg | ORAL_TABLET | Freq: Two times a day (BID) | ORAL | 3 refills | Status: DC

## 2017-09-13 MED ORDER — CLOBETASOL PROPIONATE 0.05 % EX CREA: 1.0000 "application " | TOPICAL_CREAM | Freq: Two times a day (BID) | CUTANEOUS | 3 refills | Status: DC

## 2017-09-13 NOTE — Addendum Note (Signed)
Addended by: Karren Cobble on: 09/13/2017 09:27 AM   Modules accepted: Orders

## 2017-09-13 NOTE — Assessment & Plan Note (Signed)
We discussed age appropriate health related issues, including available/recomended screening tests and vaccinations. We discussed a need for adhering to healthy diet and exercise. Labs were ordered to be later reviewed . All questions were answered. Trying to quit smoking

## 2017-09-13 NOTE — Assessment & Plan Note (Signed)
Buspar

## 2017-09-13 NOTE — Assessment & Plan Note (Signed)
Wt Readings from Last 3 Encounters:  09/13/17 217 lb (98.4 kg)  08/14/17 215 lb 9.6 oz (97.8 kg)  08/02/17 217 lb (98.4 kg)

## 2017-09-13 NOTE — Progress Notes (Signed)
Subjective:  Patient ID: Marilyn [name], female    DOB: [birth year]  Age: 62 y.o. MRN: [id number]  CC: No chief complaint on file.   HPI Marilyn [name] presents for a well exam  Outpatient Medications Prior to Visit  Medication Sig Dispense Refill  . budesonide-formoterol (SYMBICORT) 80-4.5 MCG/ACT inhaler Inhale 2 puffs into the lungs 2 (two) times daily. 1 Inhaler 11  . busPIRone (BUSPAR) 10 MG tablet Take 1 tablet (10 mg total) by mouth 2 (two) times daily. 180 tablet 2  . Cholecalciferol 5000 UNITS TABS Take 1 tablet by mouth 3 (three) times a week.    . clobetasol cream (TEMOVATE) 9.51 % Apply 1 application topically 2 (two) times daily. 60 g 3  . clonazePAM (KLONOPIN) 0.5 MG tablet 1/2 to 1 tab twice a day as needed 60 tablet 0  . Multiple Vitamin (MULTIVITAMIN WITH MINERALS) TABS tablet Take 1 tablet by mouth daily.    Marland Kitchen ALPRAZolam (XANAX) 0.5 MG tablet Take 1/2 to 1 tablet by mouth bid prn anxiety 60 tablet 2  . buPROPion (WELLBUTRIN XL) 150 MG 24 hr tablet Take 1 tablet (150 mg total) by mouth daily. 30 tablet 5  . predniSONE (STERAPRED UNI-PAK 21 TAB) 10 MG (21) TBPK tablet Take by mouth daily. Take as directed. 21 tablet 0   No facility-administered medications prior to visit.     ROS: Review of Systems  Constitutional: Negative for activity change, appetite change, chills, fatigue and unexpected weight change.  HENT: Negative for congestion, mouth sores and sinus pressure.   Eyes: Negative for visual disturbance.  Respiratory: Negative for cough and chest tightness.   Gastrointestinal: Negative for abdominal pain and nausea.  Genitourinary: Negative for difficulty urinating, frequency and vaginal pain.  Musculoskeletal: Negative for back pain and gait problem.  Skin: Negative for pallor and rash.  Neurological: Negative for dizziness, tremors, weakness, numbness and headaches.  Psychiatric/Behavioral: Negative for confusion, sleep disturbance and suicidal ideas.  The patient is nervous/anxious.     Objective:  BP 122/70 (BP Location: Right Arm, Patient Position: Sitting, Cuff Size: Large)   Pulse 77   Temp 98.5 F (36.9 C) (Oral)   Ht 5\' 5"  (1.651 m)   Wt 217 lb (98.4 kg)   SpO2 98%   BMI 36.11 kg/m   BP Readings from Last 3 Encounters:  09/13/17 122/70  08/22/17 140/82  08/14/17 124/72    Wt Readings from Last 3 Encounters:  09/13/17 217 lb (98.4 kg)  08/14/17 215 lb 9.6 oz (97.8 kg)  08/02/17 217 lb (98.4 kg)    Physical Exam  Constitutional: She appears well-developed. No distress.  HENT:  Head: Normocephalic.  Right Ear: External ear normal.  Left Ear: External ear normal.  Nose: Nose normal.  Mouth/Throat: Oropharynx is clear and moist.  Eyes: Pupils are equal, round, and reactive to light. Conjunctivae are normal. Right eye exhibits no discharge. Left eye exhibits no discharge.  Neck: Normal range of motion. Neck supple. No JVD present. No tracheal deviation present. No thyromegaly present.  Cardiovascular: Normal rate, regular rhythm and normal heart sounds.  Pulmonary/Chest: No stridor. No respiratory distress. She has no wheezes.  Abdominal: Soft. Bowel sounds are normal. She exhibits no distension and no mass. There is no tenderness. There is no rebound and no guarding.  Musculoskeletal: She exhibits no edema or tenderness.  Lymphadenopathy:    She has no cervical adenopathy.  Neurological: She displays normal reflexes. No cranial nerve deficit. She exhibits normal muscle  tone. Coordination normal.  Skin: No rash noted. No erythema.  Psychiatric: She has a normal mood and affect. Her behavior is normal. Judgment and thought content normal.    Lab Results  Component Value Date   WBC 7.4 09/03/2017   HGB 13.0 09/03/2017   HCT 40.1 09/03/2017   PLT 218.0 09/03/2017   GLUCOSE 98 09/03/2017   CHOL 210 (H) 09/03/2017   TRIG 105.0 09/03/2017   HDL 66.40 09/03/2017   LDLDIRECT 118.9 12/18/2011   LDLCALC 122 (H)  09/03/2017   ALT 13 09/03/2017   AST 11 09/03/2017   NA 138 09/03/2017   K 4.1 09/03/2017   CL 106 09/03/2017   CREATININE 0.71 09/03/2017   BUN 10 09/03/2017   CO2 24 09/03/2017   TSH 1.41 09/03/2017    No results found.  Assessment & Plan:   There are no diagnoses linked to this encounter.   No orders of the defined types were placed in this encounter.    Follow-up: No follow-ups on file.  Walker Kehr, MD

## 2017-09-13 NOTE — Assessment & Plan Note (Signed)
Vit D 

## 2017-09-13 NOTE — Assessment & Plan Note (Signed)
On Buspar, bupropion

## 2017-09-13 NOTE — Assessment & Plan Note (Signed)
Trying to quit smoking. 

## 2017-10-07 ENCOUNTER — Other Ambulatory Visit: Payer: Self-pay | Admitting: Pulmonary Disease

## 2017-11-02 ENCOUNTER — Ambulatory Visit (INDEPENDENT_AMBULATORY_CARE_PROVIDER_SITE_OTHER): Payer: No Typology Code available for payment source | Admitting: *Deleted

## 2017-11-02 DIAGNOSIS — Z23 Encounter for immunization: Secondary | ICD-10-CM

## 2017-11-25 NOTE — Progress Notes (Signed)
CLINIC:  Survivorship   REASON FOR VISIT:  Routine follow-up for history of breast cancer.   BRIEF ONCOLOGIC HISTORY:    Malignant neoplasm of upper-outer quadrant of left breast in female, estrogen receptor positive ([place])   06/20/2011 Surgery    Left breast lumpectomy with sentinel lymph node biopsy: 1 cm IDC, grade 1, ER positive, PR positive, HER-2 negative, Ki-67 6%    08/17/2011 - 09/28/2011 Radiation Therapy    Adjuvant radiation therapy    10/02/2011 - 11/23/2016 Anti-estrogen oral therapy    Arimidex 1 mg daily started 10/02/2011 stopped March 2015 for pain and restarted August 2015      INTERVAL HISTORY:  Marilyn [name] presents to the [facility] today for routine follow-up for her history of breast cancer.  Overall, she reports feeling quite well. She has underwent a couple of mammograms in the past year, one concerning for calcifications.  Her most recent mammogram in April, 2019.    She is a current every day smoker.  She has smoked 1/2 ppd for 40 years.  She is considering quitting.  She is up to date with colon cancer screening.  She has never had a skin cancer screen.  She still undergoes regular GYN visits.      REVIEW OF SYSTEMS:  Review of Systems  Constitutional: Negative for appetite change, chills, fatigue, fever and unexpected weight change.  HENT:   Negative for hearing loss, lump/mass, sore throat, tinnitus and trouble swallowing.   Eyes: Negative for eye problems and icterus.  Respiratory: Negative for chest tightness, cough, shortness of breath and wheezing.   Cardiovascular: Negative for chest pain, leg swelling and palpitations.  Gastrointestinal: Negative for abdominal distention, abdominal pain, constipation, diarrhea, nausea and vomiting.  Endocrine: Negative for hot flashes.  Skin: Negative for itching and rash.  Neurological: Negative for dizziness, extremity weakness, headaches and numbness.  Hematological: Negative for adenopathy. Does not  bruise/bleed easily.  Psychiatric/Behavioral: Negative for depression. The patient is not nervous/anxious.   Breast: Denies any new nodularity, masses, tenderness, nipple changes, or nipple discharge.       PAST MEDICAL/SURGICAL HISTORY:  Past Medical History:  Diagnosis Date  . Allergy   . Anemia    hx  . Anxiety    panic disorder  . Breast cancer ([place]) 06/06/2011   bc  left breast 3 o'clock dx=invasive ductal ca uoqER/PR=positive  . Cancer ([place])    BREAST - left  . Cigarette smoker   . Colon polyp   . Complication of anesthesia    DIFFICULTY AWAKENING  . Depression   . Diverticulosis of colon   . DJD (degenerative joint disease)   . DJD (degenerative joint disease)   . GERD (gastroesophageal reflux disease)    Pt. denies having GERD. Unable to remove it.  Marland Kitchen Headache(784.0)   . History of anemia   . History of bronchitis   . History of sarcoidosis   . Hyperlipidemia    no meds needed  . Incisional breast wound    AT AGE 54 BILATERAL INCISION TO BREAST MADE  . Leg swelling   . Panic disorder   . Personal history of radiation therapy   . S/P radiation therapy 08/17/11 - 09/28/11   LLQ - 50 Gy/25 Fractions with Boost of 10 Gy / 5 fractions  . Sialoadenitis of submandibular gland 2016  . Vitamin D deficiency    Past Surgical History:  Procedure Laterality Date  . BIOPSY BREAST     left breast  as a teenager  benign  . BREAST BIOPSY Left 06/06/2011  . BREAST EXCISIONAL BIOPSY Bilateral   . BREAST LUMPECTOMY Left 2013  . BREAST SURGERY  07/05/11   left breast lumpectomy with needle loc & axillary sln bx  . BUNIONECTOMY  2011   bilateral by Dr Little Ishikawa  . COLONOSCOPY     polyp  . cyst removed from right hand  1995  . TONSILLECTOMY  1972  . TOTAL ABDOMINAL HYSTERECTOMY  2000   Dr Nori Riis  . TRIGGER FINGER RELEASE  2008   Dr Burney Gauze     ALLERGIES:  Allergies  Allergen Reactions  . Lexapro [Escitalopram Oxalate]     Felt like zombie  . Penicillins Rash and Other  (See Comments)    At injection site.     CURRENT MEDICATIONS:  Outpatient Encounter Medications as of 11/26/2017  Medication Sig  . budesonide-formoterol (SYMBICORT) 80-4.5 MCG/ACT inhaler Inhale 2 puffs into the lungs 2 (two) times daily.  . busPIRone (BUSPAR) 10 MG tablet Take 1 tablet (10 mg total) by mouth 2 (two) times daily.  . Cholecalciferol 5000 UNITS TABS Take 1 tablet by mouth 3 (three) times a week.  . clobetasol cream (TEMOVATE) 2.23 % Apply 1 application topically 2 (two) times daily.  . clonazePAM (KLONOPIN) 0.5 MG tablet TAKE 1/2 TO 1 TABLET BY MOUTH TWICE A DAY AS NEEDED (CMD XANAX)  . Multiple Vitamin (MULTIVITAMIN WITH MINERALS) TABS tablet Take 1 tablet by mouth daily.   No facility-administered encounter medications on file as of 11/26/2017.      ONCOLOGIC FAMILY HISTORY:  Family History  Problem Relation Age of Onset  . Anemia Mother   . Other Mother        + H Pylori  . Prostate cancer Father   . Colon cancer Father        diagnosed 2009  . Cancer Father        colon and prostate cancer  . Breast cancer Sister        #1  . Cancer Sister 30       breast cancer, lung, pancreatic  . Sarcoidosis Sister        #2  . Multiple sclerosis Sister        #2  . Hypertension Brother        #1  . Cancer Brother        prostate cancer  . Prostate cancer Brother        #1  . Hyperlipidemia Brother        #1  . Cancer Cousin        breast cancer  . Esophageal cancer Neg Hx   . Stomach cancer Neg Hx   . Rectal cancer Neg Hx     GENETIC COUNSELING/TESTING: BRCA and BART tested in 2013 and negative  SOCIAL HISTORY:  Social History   Socioeconomic History  . Marital status: Married    Spouse name: Not on file  . Number of children: 2  . Years of education: Not on file  . Highest education level: Not on file  Occupational History  . Occupation: Tour manager  Social Needs  . Financial resource strain: Not on file  . Food insecurity:    Worry: Not  on file    Inability: Not on file  . Transportation needs:    Medical: Not on file    Non-medical: Not on file  Tobacco Use  . Smoking status: Current Every Day Smoker  Packs/day: 0.50    Types: Cigarettes    Last attempt to quit: 04/16/2012    Years since quitting: 5.6  . Smokeless tobacco: Never Used  Substance and Sexual Activity  . Alcohol use: Yes    Alcohol/week: 0.0 - 1.0 standard drinks    Comment: social  . Drug use: No  . Sexual activity: Not Currently    Birth control/protection: Surgical    Comment: menses age 70,hrt started  04/27/98  Lifestyle  . Physical activity:    Days per week: Not on file    Minutes per session: Not on file  . Stress: Not on file  Relationships  . Social connections:    Talks on phone: Not on file    Gets together: Not on file    Attends religious service: Not on file    Active member of club or organization: Not on file    Attends meetings of clubs or organizations: Not on file    Relationship status: Not on file  . Intimate partner violence:    Fear of current or ex partner: Not on file    Emotionally abused: Not on file    Physically abused: Not on file    Forced sexual activity: Not on file  Other Topics Concern  . Not on file  Social History Narrative   Widowed - husband died 04/28/2003   2 step-children   Exercises 3x per week   2 cups of caffeine daily            PHYSICAL EXAMINATION:  Vital Signs: Vitals:   11/26/17 1014  BP: 138/74  Pulse: 66  Resp: 18  Temp: 98 F (36.7 C)  SpO2: 100%   Filed Weights   11/26/17 1014  Weight: 210 lb 6.4 oz (95.4 kg)   General: Well-nourished, well-appearing female in no acute distress.  Unaccompanied today.   HEENT: Head is normocephalic.  Pupils equal and reactive to light. Conjunctivae clear without exudate.  Sclerae anicteric. Oral mucosa is pink, moist.  Oropharynx is pink without lesions or erythema.  Lymph: No cervical, supraclavicular, or infraclavicular lymphadenopathy  noted on palpation.  Cardiovascular: Regular rate and rhythm.Marland Kitchen Respiratory: Clear to auscultation bilaterally. Chest expansion symmetric; breathing non-labored.  Breast Exam:  -Left breast:. No skin redness, thickening, or peau d'orange appearance; no nipple retraction or nipple discharge; small about 0.5cm nodule noted at 3 oclock on left breast about 8-9cmfn -Right breast: No appreciable masses on palpation. No skin redness, thickening, or peau d'orange appearance; no nipple retraction or nipple discharge; -Axilla: No axillary adenopathy bilaterally.  GI: Abdomen soft and round; non-tender, non-distended. Bowel sounds normoactive. No hepatosplenomegaly.   GU: Deferred.  Neuro: No focal deficits. Steady gait.  Psych: Mood and affect normal and appropriate for situation.  MSK: No focal spinal tenderness to palpation, full range of motion in bilateral upper extremities Extremities: No edema. Skin: Warm and dry.  LABORATORY DATA:  None for this visit   DIAGNOSTIC IMAGING:  Most recent mammogram:     ASSESSMENT AND PLAN:  Ms.. [name] is a pleasant 62 y.o. female with history of Stage IA left breast invasive ductal carcinoma, ER+/PR+/HER2-, diagnosed in 04-28-2011, treated with lumpectomy, adjuvant radiation therapy, and anti-estrogen therapy with Anastrozole x 5 years completing in 10/2016.  [facility] for surveillance and routine follow-up.   1. History of breast cancer:  Marilyn [name] is currently clinically and radiographically without evidence of disease or recurrence of breast cancer. She will be due  for mammogram in 05/2018; orders placed today.  She will return in one year for LTS follow up.  I encouraged her to call me with any questions or concerns before her next visit at the cancer center, and I would be happy to see her sooner, if needed.    2. Left breast nodule: This is persistent, and the calcifications from the mammogram in 03/2017 were not noted in 05/2017  mammogram.  I called Marilyn [name] in radiology. He is not a computer where he can look at it today, but will look at it tomorrow and let me know.  He also suggested sending her back for repeat mammogram and ultrasound.  I will call Marilyn [name] and let he know.    3. Bone health:  Given Marilyn [name]'s age, history of breast cancer, and her previous anti-estrogen therapy with Anastrozole, she is at risk for bone demineralization. Her last DEXA scan was on 07/11/2016 and demonstrated a t score of -1.5 in the left femoral neck consistent with osteopenia.    She was given education on specific food and activities to promote bone health.  4. Cancer screening:  Due to Ms. Tyndall's history and her age, she should receive screening for skin cancers, colon cancer, and gynecologic cancers. She was encouraged to follow-up with her PCP for appropriate cancer screenings.   5. Health maintenance and wellness promotion: Marilyn [name] was encouraged to consume 5-7 servings of fruits and vegetables per day. She was also encouraged to engage in moderate to vigorous exercise for 30 minutes per day most days of the week. She was instructed to limit her alcohol consumption and was encouraged stop smoking.  We reviewed ideas for smoking cessation, and ways to quit smoking.    Dispo:  -Return to cancer center in one year for LTS follow up -Bone density due in 06/2018 -Routine mammogram due 05/2018   A total of (30) minutes of face-to-face time was spent with this patient with greater than 50% of that time in counseling and care-coordination.   Marilyn Phlegm, NP Survivorship Program [facility] [phone]   Note: PRIMARY CARE PROVIDER Cassandria Anger, [place] [phone]

## 2017-11-26 ENCOUNTER — Telehealth: Payer: Self-pay | Admitting: Adult Health

## 2017-11-26 ENCOUNTER — Encounter: Payer: Self-pay | Admitting: Adult Health

## 2017-11-26 ENCOUNTER — Inpatient Hospital Stay: Payer: No Typology Code available for payment source | Attending: Adult Health | Admitting: Adult Health

## 2017-11-26 VITALS — BP 138/74 | HR 66 | Temp 98.0°F | Resp 18 | Ht 65.0 in | Wt 210.4 lb

## 2017-11-26 DIAGNOSIS — C50412 Malignant neoplasm of upper-outer quadrant of left female breast: Secondary | ICD-10-CM | POA: Insufficient documentation

## 2017-11-26 DIAGNOSIS — F17210 Nicotine dependence, cigarettes, uncomplicated: Secondary | ICD-10-CM | POA: Diagnosis not present

## 2017-11-26 DIAGNOSIS — Z170 Estrogen receptor positive status [ER+]: Secondary | ICD-10-CM | POA: Insufficient documentation

## 2017-11-26 DIAGNOSIS — Z79811 Long term (current) use of aromatase inhibitors: Secondary | ICD-10-CM | POA: Insufficient documentation

## 2017-11-26 DIAGNOSIS — R921 Mammographic calcification found on diagnostic imaging of breast: Secondary | ICD-10-CM | POA: Insufficient documentation

## 2017-11-26 DIAGNOSIS — Z923 Personal history of irradiation: Secondary | ICD-10-CM | POA: Diagnosis not present

## 2017-11-26 NOTE — Telephone Encounter (Signed)
Gave avs and calendar ° °

## 2017-11-26 NOTE — Patient Instructions (Signed)
Bone Health Bones protect organs, store calcium, and anchor muscles. Good health habits, such as eating nutritious foods and exercising regularly, are important for maintaining healthy bones. They can also help to prevent a condition that causes bones to lose density and become weak and brittle (osteoporosis). Why is bone mass important? Bone mass refers to the amount of bone tissue that you have. The higher your bone mass, the stronger your bones. An important step toward having healthy bones throughout life is to have strong and dense bones during childhood. A young adult who has a high bone mass is more likely to have a high bone mass later in life. Bone mass at its greatest it is called peak bone mass. A large decline in bone mass occurs in older adults. In women, it occurs about the time of menopause. During this time, it is important to practice good health habits, because if more bone is lost than what is replaced, the bones will become less healthy and more likely to break (fracture). If you find that you have a low bone mass, you may be able to prevent osteoporosis or further bone loss by changing your diet and lifestyle. How can I find out if my bone mass is low? Bone mass can be measured with an X-ray test that is called a bone mineral density (BMD) test. This test is recommended for all women who are age 65 or older. It may also be recommended for men who are age 70 or older, or for people who are more likely to develop osteoporosis due to:  Having bones that break easily.  Having a long-term disease that weakens bones, such as kidney disease or rheumatoid arthritis.  Having menopause earlier than normal.  Taking medicine that weakens bones, such as steroids, thyroid hormones, or hormone treatment for breast cancer or prostate cancer.  Smoking.  Drinking three or more alcoholic drinks each day.  What are the nutritional recommendations for healthy bones? To have healthy bones, you  need to get enough of the right minerals and vitamins. Most nutrition experts recommend getting these nutrients from the foods that you eat. Nutritional recommendations vary from person to person. Ask your health care provider what is healthy for you. Here are some general guidelines. Calcium Recommendations Calcium is the most important (essential) mineral for bone health. Most people can get enough calcium from their diet, but supplements may be recommended for people who are at risk for osteoporosis. Good sources of calcium include:  Dairy products, such as low-fat or nonfat milk, cheese, and yogurt.  Dark green leafy vegetables, such as bok choy and broccoli.  Calcium-fortified foods, such as orange juice, cereal, bread, soy beverages, and tofu products.  Nuts, such as almonds.  Follow these recommended amounts for daily calcium intake:  Children, age 1?3: 700 mg.  Children, age 4?8: 1,000 mg.  Children, age 9?13: 1,300 mg.  Teens, age 14?18: 1,300 mg.  Adults, age 19?50: 1,000 mg.  Adults, age 51?70: ? Men: 1,000 mg. ? Women: 1,200 mg.  Adults, age 71 or older: 1,200 mg.  Pregnant and breastfeeding females: ? Teens: 1,300 mg. ? Adults: 1,000 mg.  Vitamin D Recommendations Vitamin D is the most essential vitamin for bone health. It helps the body to absorb calcium. Sunlight stimulates the skin to make vitamin D, so be sure to get enough sunlight. If you live in a cold climate or you do not get outside often, your health care provider may recommend that you take vitamin   D supplements. Good sources of vitamin D in your diet include:  Egg yolks.  Saltwater fish.  Milk and cereal fortified with vitamin D.  Follow these recommended amounts for daily vitamin D intake:  Children and teens, age 1?18: 600 international units.  Adults, age 50 or younger: 400-800 international units.  Adults, age 51 or older: 800-1,000 international units.  Other Nutrients Other nutrients  for bone health include:  Phosphorus. This mineral is found in meat, poultry, dairy foods, nuts, and legumes. The recommended daily intake for adult men and adult women is 700 mg.  Magnesium. This mineral is found in seeds, nuts, dark green vegetables, and legumes. The recommended daily intake for adult men is 400?420 mg. For adult women, it is 310?320 mg.  Vitamin K. This vitamin is found in green leafy vegetables. The recommended daily intake is 120 mg for adult men and 90 mg for adult women.  What type of physical activity is best for building and maintaining healthy bones? Weight-bearing and strength-building activities are important for building and maintaining peak bone mass. Weight-bearing activities cause muscles and bones to work against gravity. Strength-building activities increases muscle strength that supports bones. Weight-bearing and muscle-building activities include:  Walking and hiking.  Jogging and running.  Dancing.  Gym exercises.  Lifting weights.  Tennis and racquetball.  Climbing stairs.  Aerobics.  Adults should get at least 30 minutes of moderate physical activity on most days. Children should get at least 60 minutes of moderate physical activity on most days. Ask your health care provide what type of exercise is best for you. Where can I find more information? For more information, check out the following websites:  National Osteoporosis Foundation: http://nof.org/learn/basics  National Institutes of Health: http://www.niams.nih.gov/Health_Info/Bone/Bone_Health/bone_health_for_life.asp  This information is not intended to replace advice given to you by your health care provider. Make sure you discuss any questions you have with your health care provider. Document Released: 05/06/2003 Document Revised: 09/03/2015 Document Reviewed: 02/18/2014 Elsevier Interactive Patient Education  2018 [employer]  

## 2017-11-27 ENCOUNTER — Other Ambulatory Visit: Payer: Self-pay | Admitting: Adult Health

## 2017-11-27 DIAGNOSIS — C50412 Malignant neoplasm of upper-outer quadrant of left female breast: Secondary | ICD-10-CM

## 2017-11-27 DIAGNOSIS — Z170 Estrogen receptor positive status [ER+]: Principal | ICD-10-CM

## 2017-11-27 NOTE — Progress Notes (Signed)
Reviewed last mammogram with Dr. Rosana Hoes.  Patient recommended to have diagnostic mammogram and ultrasound.  I called and reviewed with patient and placed orders.

## 2017-12-03 ENCOUNTER — Ambulatory Visit
Admission: RE | Admit: 2017-12-03 | Discharge: 2017-12-03 | Disposition: A | Discharge status: Home / Self Care | Payer: No Typology Code available for payment source | Source: Ambulatory Visit | Attending: Adult Health | Admitting: Adult Health

## 2017-12-03 DIAGNOSIS — C50412 Malignant neoplasm of upper-outer quadrant of left female breast: Secondary | ICD-10-CM

## 2017-12-03 DIAGNOSIS — Z170 Estrogen receptor positive status [ER+]: Principal | ICD-10-CM

## 2017-12-12 ENCOUNTER — Inpatient Hospital Stay: Payer: No Typology Code available for payment source

## 2017-12-12 ENCOUNTER — Encounter: Payer: Self-pay | Admitting: Genetic Counselor

## 2017-12-12 ENCOUNTER — Inpatient Hospital Stay: Payer: No Typology Code available for payment source | Attending: Adult Health | Admitting: Genetic Counselor

## 2017-12-12 DIAGNOSIS — Z800 Family history of malignant neoplasm of digestive organs: Secondary | ICD-10-CM | POA: Diagnosis not present

## 2017-12-12 DIAGNOSIS — Z803 Family history of malignant neoplasm of breast: Secondary | ICD-10-CM

## 2017-12-12 DIAGNOSIS — C50412 Malignant neoplasm of upper-outer quadrant of left female breast: Secondary | ICD-10-CM

## 2017-12-12 DIAGNOSIS — Z170 Estrogen receptor positive status [ER+]: Secondary | ICD-10-CM

## 2017-12-12 DIAGNOSIS — Z8042 Family history of malignant neoplasm of prostate: Secondary | ICD-10-CM | POA: Diagnosis not present

## 2017-12-12 NOTE — Progress Notes (Signed)
REFERRING PROVIDER: Nicholas Lose, MD [place], [place] [id number]  PRIMARY PROVIDER:  Plotnikov, Evie Lacks, MD  PRIMARY REASON FOR VISIT:  1. Malignant neoplasm of upper-outer quadrant of left breast in female, estrogen receptor positive ([place])   2. Family history of breast cancer   3. Family history of pancreatic cancer   4. Family history of prostate cancer   5. Family history of colon cancer      HISTORY OF PRESENT ILLNESS:   Marilyn [name], a 62 y.o. female, was seen for a [place] cancer genetics consultation at the request of Dr. Lindi [name] due to a personal and family history of cancer.  Marilyn [name] presents to clinic today to discuss the possibility of a hereditary predisposition to cancer, genetic testing, and to further clarify her future cancer risks, as well as potential cancer risks for family members.   In 2013, at the age of 70, Marilyn [name] was diagnosed with invasive ductal carcinoma of the left breast. The tumor was ER+/PR+/Her2-.  This was treated with lumpectomy and radiation.  At that time she underwent genetic testing for BRCA1 and BRCA2 through [employer].  It was negative.Marland Kitchen      CANCER HISTORY:    Malignant neoplasm of upper-outer quadrant of left breast in female, estrogen receptor positive ([place])   06/20/2011 Surgery    Left breast lumpectomy with sentinel lymph node biopsy: 1 cm IDC, grade 1, ER positive, PR positive, HER-2 negative, Ki-67 6%    08/17/2011 - 09/28/2011 Radiation Therapy    Adjuvant radiation therapy    10/02/2011 - 11/23/2016 Anti-estrogen oral therapy    Arimidex 1 mg daily started 10/02/2011 stopped March 2015 for pain and restarted August 2015      HORMONAL RISK FACTORS:  Menarche was at age 10.  First live birth at age N/A.  OCP use for approximately 10 years.  Ovaries intact: one ovary.  Hysterectomy: yes.  Menopausal status: postmenopausal.  HRT use: 0 years. Colonoscopy: yes; Between 5-10  polyps. Mammogram within the last year: yes. Number of breast biopsies: 3. Up to date with pelvic exams:  yes. Any excessive radiation exposure in the past:  no  Past Medical History:  Diagnosis Date  . Allergy   . Anemia    hx  . Anxiety    panic disorder  . Breast cancer ([place]) 06/06/2011   bc  left breast 3 o'clock dx=invasive ductal ca uoqER/PR=positive  . Cancer ([place])    BREAST - left  . Cigarette smoker   . Colon polyp   . Complication of anesthesia    DIFFICULTY AWAKENING  . Depression   . Diverticulosis of colon   . DJD (degenerative joint disease)   . DJD (degenerative joint disease)   . Family history of breast cancer   . Family history of colon cancer   . Family history of pancreatic cancer   . Family history of prostate cancer   . GERD (gastroesophageal reflux disease)    Pt. denies having GERD. Unable to remove it.  Marland Kitchen Headache(784.0)   . History of anemia   . History of bronchitis   . History of sarcoidosis   . Hyperlipidemia    no meds needed  . Incisional breast wound    AT AGE 54 BILATERAL INCISION TO BREAST MADE  . Leg swelling   . Panic disorder   . Personal history of radiation therapy   . S/P radiation therapy 08/17/11 - 09/28/11   LLQ - 50 Gy/25  Fractions with Boost of 10 Gy / 5 fractions  . Sialoadenitis of submandibular gland May 15, 2014  . Vitamin D deficiency     Past Surgical History:  Procedure Laterality Date  . BIOPSY BREAST     left breast  as a teenager  benign  . BREAST BIOPSY Left 06/06/2011  . BREAST EXCISIONAL BIOPSY Bilateral   . BREAST LUMPECTOMY Left 05-16-11  . BREAST SURGERY  07/05/11   left breast lumpectomy with needle loc & axillary sln bx  . BUNIONECTOMY  May 15, 2009   bilateral by Dr Little Ishikawa  . COLONOSCOPY     polyp  . cyst removed from right hand  1995  . TONSILLECTOMY  1972  . TOTAL ABDOMINAL HYSTERECTOMY  05/15/98   Dr Nori Riis  . TRIGGER FINGER RELEASE  May 15, 2006   Dr Burney Gauze    Social History   Socioeconomic History  . Marital  status: Married    Spouse name: Not on file  . Number of children: 2  . Years of education: Not on file  . Highest education level: Not on file  Occupational History  . Occupation: Tour manager  Social Needs  . Financial resource strain: Not on file  . Food insecurity:    Worry: Not on file    Inability: Not on file  . Transportation needs:    Medical: Not on file    Non-medical: Not on file  Tobacco Use  . Smoking status: Current Every Day Smoker    Packs/day: 0.50    Types: Cigarettes    Last attempt to quit: 04/16/2012    Years since quitting: 5.6  . Smokeless tobacco: Never Used  Substance and Sexual Activity  . Alcohol use: Yes    Alcohol/week: 0.0 - 1.0 standard drinks    Comment: social  . Drug use: No  . Sexual activity: Not Currently    Birth control/protection: Surgical    Comment: menses age 29,hrt started  05/15/98  Lifestyle  . Physical activity:    Days per week: Not on file    Minutes per session: Not on file  . Stress: Not on file  Relationships  . Social connections:    Talks on phone: Not on file    Gets together: Not on file    Attends religious service: Not on file    Active member of club or organization: Not on file    Attends meetings of clubs or organizations: Not on file    Relationship status: Not on file  Other Topics Concern  . Not on file  Social History Narrative   Widowed - husband died 2003/05/16   2 step-children   Exercises 3x per week   2 cups of caffeine daily           FAMILY HISTORY:  We obtained a detailed, 4-generation family history.  Significant diagnoses are listed below: Family History  Problem Relation Age of Onset  . Anemia Mother   . Other Mother        + H Pylori  . Seizures Mother   . Prostate cancer Father        dx over 48  . Colon cancer Father 44       diagnosed 05-16-07  . Bladder Cancer Father        dx over 61  . Breast cancer Sister        dx in her 16s; mat half sister  . Pancreatic cancer Sister 49        d. 18  .  Lung cancer Sister   . Sarcoidosis Sister        mat 1/2 sister  . Multiple sclerosis Sister        #2  . Hypertension Brother        #1  . Hyperlipidemia Brother   . Prostate cancer Brother        dx under 59  . Breast cancer Cousin        pat first cousin d. 25  . Dementia Maternal Aunt   . Seizures Maternal Uncle   . Lung cancer Paternal Aunt   . Brain cancer Other        benign  . Dementia Maternal Aunt   . Lung cancer Maternal Uncle   . Sarcoidosis Cousin        mat first cousin  . Breast cancer Cousin        maternal 2nd cousins - distant  . Esophageal cancer Neg Hx   . Stomach cancer Neg Hx   . Rectal cancer Neg Hx     The patient had a full brother and two maternal half sisters.  Her brother had prostate cancer under age 58 and has a son who had a benign brain tumor.  One sister had breast cancer in her 67's and lung and pancreatic cancer at 35.  The other sister died form sarcoidosis.  Both parents are living.  The patient's mother is 69.  She had three sisters and three brothers.  One brother had lung cancer and a brother and two sisters had dementia.  Both maternal grandparents are deceased.  There are several more extended relatives with breast cancer.  The patient's father is living at 33.  He had prostate cancer, colon cancer at 4 and bladder cancer in his 58's. He had a full sister and a paternal half brother and sister.   His full sister had lung cancer and had a daughter with breast cancer.  The paternal grandparents are deceased.  Marilyn [name] is unaware of previous family history of genetic testing for hereditary cancer risks. Patient's maternal ancestors are of African American descent, and paternal ancestors are of African American descent. There is no reported Ashkenazi Jewish ancestry. There is no known consanguinity.  GENETIC COUNSELING ASSESSMENT: Marilyn [name] is a 62 y.o. female with a personal and family history of breast cancer which  is somewhat suggestive of a hereditary cancer syndrome and predisposition to cancer. We, therefore, discussed and recommended the following at today's visit.   DISCUSSION: We discussed that about 5-10% of breast cancer is hereditary with most cases due to BRCA mutations.  The patient was tested for BRCA mutations in 2013.  We discussed that updated testing is warranted due to the improved testing and more genes we test for now, versus at that time.  There are other genes that can play a part of hereditary breast cancer syndromes, including ATM, CHEK2 and PALB2.  We reviewed the characteristics, features and inheritance patterns of hereditary cancer syndromes. We also discussed genetic testing, including the appropriate family members to test, the process of testing, insurance coverage and turn-around-time for results. We discussed the implications of a negative, positive and/or variant of uncertain significant result. We recommended Marilyn [name] pursue genetic testing for the common hereditary cancer gene panel. The Hereditary Gene Panel offered by [facility] includes sequencing and/or deletion duplication testing of the following 47 genes: APC, ATM, AXIN2, BARD1, BMPR1A, BRCA1, BRCA2, BRIP1, CDH1, CDK4, CDKN2A (p14ARF), CDKN2A (p16INK4a), CHEK2, CTNNA1, DICER1, EPCAM (  Deletion/duplication testing only), GREM1 (promoter region deletion/duplication testing only), KIT, MEN1, MLH1, MSH2, MSH3, MSH6, MUTYH, NBN, NF1, NHTL1, PALB2, PDGFRA, PMS2, POLD1, POLE, PTEN, RAD50, RAD51C, RAD51D, SDHB, SDHC, SDHD, SMAD4, SMARCA4. STK11, TP53, TSC1, TSC2, and VHL.  The following genes were evaluated for sequence changes only: SDHA and HOXB13 c.251G>A variant only.   Based on Marilyn [name]'s personal and family history of cancer, she meets medical criteria for genetic testing. Despite that she meets criteria, she may still have an out of pocket cost. We discussed that if her out of pocket cost for testing is over $100, the laboratory  will call and confirm whether she wants to proceed with testing.  If the out of pocket cost of testing is less than $100 she will be billed by the genetic testing laboratory.   PLAN: After considering the risks, benefits, and limitations, Marilyn [name]  provided informed consent to pursue genetic testing and the blood sample was sent to [facility] for analysis of the common hereditary cancer panel. Results should be available within approximately 2-3 weeks' time, at which point they will be disclosed by telephone to Marilyn [name], as will any additional recommendations warranted by these results. Marilyn [name] will receive a summary of her genetic counseling visit and a copy of her results once available. This information will also be available in Epic. We encouraged Marilyn [name] to remain in contact with cancer genetics annually so that we can continuously update the family history and inform her of any changes in cancer genetics and testing that may be of benefit for her family. Marilyn [name] questions were answered to her satisfaction today. Our contact information was provided should additional questions or concerns arise.  Lastly, we encouraged Ms. Sagona to remain in contact with cancer genetics annually so that we can continuously update the family history and inform her of any changes in cancer genetics and testing that may be of benefit for this family.   Ms.  [name] questions were answered to her satisfaction today. Our contact information was provided should additional questions or concerns arise. Thank you for the referral and allowing [place] to share in the care of your patient.   Jeromie Gainor P. Florene Glen, [place], [facility] Certified Genetic Counselor [email or web address] phone: [phone]  The patient was seen for a total of 45 minutes in face-to-face genetic counseling.  This patient was discussed with Drs. Magrinat, Lindi [name] and/or Burr Medico who agrees with the above.     _______________________________________________________________________ For Office Staff:  Number of people involved in session: 1 Was an Intern/ student involved with case: no

## 2017-12-14 ENCOUNTER — Other Ambulatory Visit: Payer: Self-pay | Admitting: Pulmonary Disease

## 2017-12-17 ENCOUNTER — Other Ambulatory Visit: Payer: Self-pay | Admitting: Internal Medicine

## 2017-12-17 NOTE — Telephone Encounter (Signed)
Copied from [place] [phone] [phone]. Topic: Quick Communication - Rx Refill/Question >> Dec 17, 2017  4:01 PM Sheran Luz wrote: Medication:clonazePAM (KLONOPIN) 0.5 MG tablet  Medication was prescribed by another provider but pt states she was advised to contact PCP.  Pt has made an appointment for 11/5 to discuss anxiety. Pt would like to know if it would be possible to get a refill or partial refill until then. Please advise.     Preferred Pharmacy (with phone number or street name): [id number] [address], [place].  [phone] (Phone) [phone] (Fax)

## 2017-12-17 NOTE — Telephone Encounter (Signed)
Patient is requesting a refill of Klonopin / This medication was prescribed by Dr. Lenna Gilford / However, patient is scheduled to see Dr. Alain Marion on 01-11-2018 for anxiety / Is providing willing to prescribe medication until visit?

## 2017-12-20 MED ORDER — CLONAZEPAM 0.5 MG PO TABS: ORAL_TABLET | ORAL | 1 refills | Status: DC

## 2017-12-20 NOTE — Telephone Encounter (Signed)
MD approved and sent electronically to pof../lmb  

## 2017-12-21 ENCOUNTER — Telehealth: Payer: Self-pay | Admitting: Emergency Medicine

## 2017-12-21 NOTE — Telephone Encounter (Signed)
Pt notified, pharmacy has refills and one is ready to pick up

## 2017-12-21 NOTE — Telephone Encounter (Signed)
Pt requested a refill on her busPIRone (BUSPAR) 10 MG tablet. Pharmacy is [facility]. Thanks.

## 2017-12-26 ENCOUNTER — Telehealth: Payer: Self-pay | Admitting: Genetic Counselor

## 2017-12-26 ENCOUNTER — Encounter: Payer: Self-pay | Admitting: Genetic Counselor

## 2017-12-26 DIAGNOSIS — Z1379 Encounter for other screening for genetic and chromosomal anomalies: Secondary | ICD-10-CM | POA: Insufficient documentation

## 2017-12-26 NOTE — Telephone Encounter (Signed)
Revealed negative genetic testing.  Discussed that we do not know why she has breast cancer or why there is cancer in the family. It could be due to a different gene that we are not testing, or maybe our current technology may not be able to pick something up.  It will be important for her to keep in contact with genetics to keep up with whether additional testing may be needed. 

## 2017-12-28 ENCOUNTER — Ambulatory Visit: Payer: Self-pay | Admitting: Genetic Counselor

## 2017-12-28 DIAGNOSIS — Z1379 Encounter for other screening for genetic and chromosomal anomalies: Secondary | ICD-10-CM

## 2017-12-28 NOTE — Progress Notes (Signed)
HPI:  Ms. Jalomo was previously seen in the [facility] clinic due to a personal and family history of cancer and concerns regarding a hereditary predisposition to cancer. Please refer to our prior cancer genetics clinic note for more information regarding Ms. Cregan's medical, social and family histories, and our assessment and recommendations, at the time. Ms. Loyer recent genetic test results were disclosed to her, as were recommendations warranted by these results. These results and recommendations are discussed in more detail below.  CANCER HISTORY:  Oncology History   ATM and MUTYH VUS.  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  The Hereditary Gene Panel offered by [facility] includes sequencing and/or deletion duplication testing of the following 47 genes: APC, ATM, AXIN2, BARD1, BMPR1A, BRCA1, BRCA2, BRIP1, CDH1, CDK4, CDKN2A (p14ARF), CDKN2A (p16INK4a), CHEK2, CTNNA1, DICER1, EPCAM (Deletion/duplication testing only), GREM1 (promoter region deletion/duplication testing only), KIT, MEN1, MLH1, MSH2, MSH3, MSH6, MUTYH, NBN, NF1, NHTL1, PALB2, PDGFRA, PMS2, POLD1, POLE, PTEN, RAD50, RAD51C, RAD51D, SDHB, SDHC, SDHD, SMAD4, SMARCA4. STK11, TP53, TSC1, TSC2, and VHL.  The following genes were evaluated for sequence changes only: SDHA and HOXB13 c.251G>A variant only. The report date is 12/21/2017.     FAMILY HISTORY:  We obtained a detailed, 4-generation family history.  Significant diagnoses are listed below: Family History  Problem Relation Age of Onset  . Anemia Mother   . Other Mother        + H Pylori  . Seizures Mother   . Prostate cancer Father        dx over 65  . Colon cancer Father 32       diagnosed 2009  . Bladder Cancer Father        dx over 77  . Breast cancer Sister        dx in her 75s; mat half sister  . Pancreatic cancer Sister 60       d. 35  . Lung cancer Sister   . Sarcoidosis Sister        mat 1/2 sister  . Multiple sclerosis Sister        #2  . Hypertension Brother        #1  . Hyperlipidemia Brother   . Prostate cancer Brother        dx under 57  . Breast cancer Cousin        pat first cousin d. 73  . Dementia Maternal Aunt   . Seizures Maternal Uncle   . Lung cancer Paternal Aunt   . Brain cancer Other        benign  . Dementia Maternal Aunt   . Lung cancer Maternal Uncle   . Sarcoidosis Cousin        mat first cousin  . Breast cancer Cousin        maternal 2nd cousins - distant  . Esophageal cancer  Neg Hx   . Stomach cancer Neg Hx   . Rectal cancer Neg Hx      The patient had a full brother and two maternal half sisters.  Her brother had prostate cancer under age 56 and has a son who had a benign brain tumor.  One sister had breast cancer in her 65's and lung and pancreatic cancer at 37.  The other sister died form sarcoidosis.  Both parents are living.  The patient's mother is 40.  She had three sisters and three brothers.  One brother had lung cancer and a brother and two sisters had dementia.  Both maternal grandparents are deceased.  There are several more extended relatives with breast cancer.  The patient's father is living at 53.  He had prostate cancer, colon cancer at 55 and bladder cancer in his 15's. He had a full sister and a paternal half brother and sister.   His full sister had lung cancer and had a daughter with  breast cancer.  The paternal grandparents are deceased.  Ms. Borah is unaware of previous family history of genetic testing for hereditary cancer risks. Patient's maternal ancestors are of African American descent, and paternal ancestors are of African American descent. There is no reported Ashkenazi Jewish ancestry. There is no known consanguinity.  GENETIC TEST RESULTS: Genetic testing reported out on December 21, 2017 through the common hereditary cancer panel found no deleterious mutations.  The Hereditary Gene Panel offered by [facility] includes sequencing and/or deletion duplication testing of the following 47 genes: APC, ATM, AXIN2, BARD1, BMPR1A, BRCA1, BRCA2, BRIP1, CDH1, CDK4, CDKN2A (p14ARF), CDKN2A (p16INK4a), CHEK2, CTNNA1, DICER1, EPCAM (Deletion/duplication testing only), GREM1 (promoter region deletion/duplication testing only), KIT, MEN1, MLH1, MSH2, MSH3, MSH6, MUTYH, NBN, NF1, NHTL1, PALB2, PDGFRA, PMS2, POLD1, POLE, PTEN, RAD50, RAD51C, RAD51D, SDHB, SDHC, SDHD, SMAD4, SMARCA4. STK11, TP53, TSC1, TSC2, and VHL.  The following genes were evaluated for sequence changes only: SDHA and HOXB13 c.251G>A variant only. The test report has been scanned into EPIC and is located under the Molecular Pathology section of the Results Review tab.    We discussed with Ms. Mcglinchey that since the current genetic testing is not perfect, it is possible there may be a gene mutation in one of these genes that current testing cannot detect, but that chance is small.  We also discussed, that it is possible that another gene that has not yet been discovered, or that we have not yet tested, is responsible for the cancer diagnoses in the family, and it is, therefore, important to remain in touch with cancer genetics in the future so that we can continue to offer Ms. Fawver the most up to date genetic testing.   Genetic testing did detect two Variants of Unknown Significance -one in the ATM gene called c.3191T>C  and a second in the MUTYH gene called c.920G>A.  At this time, it is unknown if these variants are associated with increased cancer risk or if they are a normal finding, but most variants such as these get reclassified to being inconsequential. They should not be used to make medical management decisions. With time, we suspect the lab will determine the significance of these variants, if any. If we do learn more about it, we will try to contact Ms. Schnetzer to discuss it further. However, it is important to stay in touch with [place] periodically and keep the address and phone number up to date.    CANCER SCREENING RECOMMENDATIONS:This result is reassuring and  indicates that Ms. Weinmann likely does not have an increased risk for a future cancer due to a mutation in one of these genes. This normal test also suggests that Ms. Kusch's cancer was most likely not due to an inherited predisposition associated with one of these genes.  Most cancers happen by chance and this negative test suggests that her cancer falls into this category.  We, therefore, recommended she continue to follow the cancer management and screening guidelines provided by her oncology and primary healthcare provider.   An individual's cancer risk and medical management are not determined by genetic test results alone. Overall cancer risk assessment incorporates additional factors, including personal medical history, family history, and any available genetic information that may result in a personalized plan for cancer prevention and surveillance.  RECOMMENDATIONS FOR FAMILY MEMBERS:  Women in this family might be at some increased risk of developing cancer, over the general population risk, simply due to the family history of cancer.  We recommended women in this family have a yearly mammogram beginning at age 71, or 2 years younger than the earliest onset of cancer, an annual clinical breast exam, and perform monthly breast self-exams. Women in  this family should also have a gynecological exam as recommended by their primary provider. All family members should have a colonoscopy by age 3.  FOLLOW-UP: Lastly, we discussed with Ms. Friedhoff that cancer genetics is a rapidly advancing field and it is possible that new genetic tests will be appropriate for her and/or her family members in the future. We encouraged her to remain in contact with cancer genetics on an annual basis so we can update her personal and family histories and let her know of advances in cancer genetics that may benefit this family.   Our contact number was provided. Ms. Waltman questions were answered to her satisfaction, and she knows she is welcome to call us at anytime with additional questions or concerns.   Roma Kayser, MS, [facility] Certified Genetic Counselor [email or web address]

## 2018-01-01 ENCOUNTER — Ambulatory Visit: Payer: No Typology Code available for payment source | Admitting: Internal Medicine

## 2018-01-01 DIAGNOSIS — Z0289 Encounter for other administrative examinations: Secondary | ICD-10-CM

## 2018-01-02 ENCOUNTER — Encounter: Payer: Self-pay | Admitting: Internal Medicine

## 2018-01-02 ENCOUNTER — Ambulatory Visit: Payer: No Typology Code available for payment source | Admitting: Internal Medicine

## 2018-01-02 DIAGNOSIS — E559 Vitamin D deficiency, unspecified: Secondary | ICD-10-CM | POA: Diagnosis not present

## 2018-01-02 DIAGNOSIS — F410 Panic disorder [episodic paroxysmal anxiety] without agoraphobia: Secondary | ICD-10-CM | POA: Diagnosis not present

## 2018-01-02 DIAGNOSIS — F17200 Nicotine dependence, unspecified, uncomplicated: Secondary | ICD-10-CM | POA: Diagnosis not present

## 2018-01-02 DIAGNOSIS — F4323 Adjustment disorder with mixed anxiety and depressed mood: Secondary | ICD-10-CM | POA: Diagnosis not present

## 2018-01-02 MED ORDER — BUSPIRONE HCL 30 MG PO TABS: 15.0000 mg | ORAL_TABLET | Freq: Two times a day (BID) | ORAL | 5 refills | Status: DC

## 2018-01-02 NOTE — Assessment & Plan Note (Signed)
1 PPD Needs to quit

## 2018-01-02 NOTE — Assessment & Plan Note (Addendum)
Increase Buspar Reduce Clonazepam Taking care of mom 63 and dad w/dementia 54 and father in law 74 - CHF, CRF

## 2018-01-02 NOTE — Assessment & Plan Note (Signed)
Vit D 

## 2018-01-02 NOTE — Assessment & Plan Note (Signed)
Taking care of mom 34 and dad w/dementia 34 and father in law 30 - CHF, CRF - discussed

## 2018-01-02 NOTE — Patient Instructions (Addendum)
Sign up for [employer] ( via [employer] on your phone or your ipad). If you don't have a Art therapist card  - go to [employer] branch. They will set you up in 15 minutes. It is free. You can check out books to read and to listen, check out magazines and newspapers, movies etc.  The 36 hour Day  Rudene Anda "Too Soon Old, Too Late Smart" - not available in the [employer] a [employer]

## 2018-01-02 NOTE — Progress Notes (Signed)
Subjective:  Patient ID: Marilyn [name], female    DOB: February 09, 1956  Age: 62 y.o. MRN: [id number]  CC: No chief complaint on file.   HPI [name] presents for anxiety, panic attacks Taking care of mom 48 and dad w/dementia 45 and father in law 72 - CHF, CRF   Outpatient Medications Prior to Visit  Medication Sig Dispense Refill  . budesonide-formoterol (SYMBICORT) 80-4.5 MCG/ACT inhaler Inhale 2 puffs into the lungs 2 (two) times daily. 1 Inhaler 11  . busPIRone (BUSPAR) 10 MG tablet Take 1 tablet (10 mg total) by mouth 2 (two) times daily. 180 tablet 3  . Cholecalciferol 5000 UNITS TABS Take 1 tablet by mouth 3 (three) times a week.    . clobetasol cream (TEMOVATE) 3.26 % Apply 1 application topically 2 (two) times daily. 60 g 3  . clonazePAM (KLONOPIN) 0.5 MG tablet TAKE 1/2 TO 1 TABLET BY MOUTH TWICE A DAY AS NEEDED (CMD XANAX) 60 tablet 1  . Multiple Vitamin (MULTIVITAMIN WITH MINERALS) TABS tablet Take 1 tablet by mouth daily.     No facility-administered medications prior to visit.     ROS: Review of Systems  Constitutional: Negative for activity change, appetite change, chills, fatigue and unexpected weight change.  HENT: Negative for congestion, mouth sores and sinus pressure.   Eyes: Negative for visual disturbance.  Respiratory: Negative for cough and chest tightness.   Gastrointestinal: Negative for abdominal pain and nausea.  Genitourinary: Negative for difficulty urinating, frequency and vaginal pain.  Musculoskeletal: Negative for back pain and gait problem.  Skin: Negative for pallor and rash.  Neurological: Negative for dizziness, tremors, weakness, numbness and headaches.  Psychiatric/Behavioral: Negative for confusion, decreased concentration, sleep disturbance and suicidal ideas. The patient is nervous/anxious.     Objective:  BP 132/80 (BP Location: Left Arm, Patient Position: Sitting, Cuff Size: Large)   Pulse 69   Temp 98 F (36.7 C) (Oral)    Ht 5\' 5"  (1.651 m)   Wt 209 lb (94.8 kg)   SpO2 99%   BMI 34.78 kg/m   BP Readings from Last 3 Encounters:  01/02/18 132/80  11/26/17 138/74  09/13/17 122/70    Wt Readings from Last 3 Encounters:  01/02/18 209 lb (94.8 kg)  11/26/17 210 lb 6.4 oz (95.4 kg)  09/13/17 217 lb (98.4 kg)    Physical Exam  Constitutional: She appears well-developed. No distress.  HENT:  Head: Normocephalic.  Right Ear: External ear normal.  Left Ear: External ear normal.  Nose: Nose normal.  Mouth/Throat: Oropharynx is clear and moist.  Eyes: Pupils are equal, round, and reactive to light. Conjunctivae are normal. Right eye exhibits no discharge. Left eye exhibits no discharge.  Neck: Normal range of motion. Neck supple. No JVD present. No tracheal deviation present. No thyromegaly present.  Cardiovascular: Normal rate, regular rhythm and normal heart sounds.  Pulmonary/Chest: No stridor. No respiratory distress. She has no wheezes.  Abdominal: Soft. Bowel sounds are normal. She exhibits no distension and no mass. There is no tenderness. There is no rebound and no guarding.  Musculoskeletal: She exhibits no edema or tenderness.  Lymphadenopathy:    She has no cervical adenopathy.  Neurological: She displays normal reflexes. No cranial nerve deficit. She exhibits normal muscle tone. Coordination normal.  Skin: No rash noted. No erythema.  Psychiatric: She has a normal mood and affect. Her behavior is normal. Judgment and thought content normal.  obese   Lab Results  Component Value Date  WBC 7.4 09/03/2017   HGB 13.0 09/03/2017   HCT 40.1 09/03/2017   PLT 218.0 09/03/2017   GLUCOSE 98 09/03/2017   CHOL 210 (H) 09/03/2017   TRIG 105.0 09/03/2017   HDL 66.40 09/03/2017   LDLDIRECT 118.9 12/18/2011   LDLCALC 122 (H) 09/03/2017   ALT 13 09/03/2017   AST 11 09/03/2017   NA 138 09/03/2017   K 4.1 09/03/2017   CL 106 09/03/2017   CREATININE 0.71 09/03/2017   BUN 10 09/03/2017   CO2  24 09/03/2017   TSH 1.41 09/03/2017    US Breast [place]illa  Result Date: 12/03/2017 CLINICAL DATA:  History of LEFT breast cancer in 2013 status post lumpectomy. Patient presented on 04/04/2017 with complaints of a palpable mass at the lumpectomy site. Corresponding diagnostic mammogram and ultrasound report described no evidence of malignancy and the palpable lump within the outer LEFT breast corresponded to a 9 mm benign dystrophic calcification (fat necrosis). Patient returns today with a persistent LEFT breast nodule, possibly related to the previous dystrophic calcification in the lateral LEFT breast. EXAM: DIGITAL DIAGNOSTIC LEFT MAMMOGRAM WITH CAD AND TOMO ULTRASOUND LEFT BREAST COMPARISON:  Previous exams including diagnostic mammogram and ultrasound dated 04/04/2017 and screening mammogram dated 06/20/2017. ACR Breast Density Category b: There are scattered areas of fibroglandular density. FINDINGS: Bilateral CC and MLO views were obtained today, with additional 3D tomosynthesis, and with additional spot view of the outer LEFT breast corresponding to the area of clinical concern with overlying skin marker in place. There are stable postsurgical changes within the LEFT breast. There are no new dominant masses, suspicious calcifications or secondary signs of malignancy within the LEFT breast. Mammographic images were processed with CAD. Targeted ultrasound is performed, evaluating the area of patient's palpable lump in the outer LEFT breast, as directed by the patient, showing a benign dystrophic calcification at the 3 o'clock axis, 9 cm from the nipple, measuring 1 cm greatest dimension, corresponding to the benign fat necrosis calcification seen on the exaggerated CC view of the LEFT breast on 04/04/2017. No suspicious solid or cystic mass is identified by ultrasound. IMPRESSION: No evidence of malignancy within the LEFT breast. Stable postsurgical changes within the outer LEFT breast.  Benign dystrophic calcification (benign fat necrosis) within the outer LEFT breast at the 3 o'clock axis, corresponding to the site of patient's palpable lump. Patient may return to routine annual bilateral screening mammogram schedule. Next bilateral screening mammogram will be due in April of 2020 in conjunction with patient's routine RIGHT breast screening mammogram schedule. RECOMMENDATION: Bilateral screening mammogram in April 2020. I have discussed the findings and recommendations with the patient. Results were also provided in writing at the conclusion of the visit. If applicable, a reminder letter will be sent to the patient regarding the next appointment. BI-RADS CATEGORY  2: Benign. Electronically Signed   By: Franki Cabot M.D.   On: 12/03/2017 11:12   Mm Diag Breast Tomo Uni Left  Result Date: 12/03/2017 CLINICAL DATA:  History of LEFT breast cancer in 2013 status post lumpectomy. Patient presented on 04/04/2017 with complaints of a palpable mass at the lumpectomy site. Corresponding diagnostic mammogram and ultrasound report described no evidence of malignancy and the palpable lump within the outer LEFT breast corresponded to a 9 mm benign dystrophic calcification (fat necrosis). Patient returns today with a persistent LEFT breast nodule, possibly related to the previous dystrophic calcification in the lateral LEFT breast. EXAM: DIGITAL DIAGNOSTIC LEFT MAMMOGRAM WITH CAD AND  TOMO ULTRASOUND LEFT BREAST COMPARISON:  Previous exams including diagnostic mammogram and ultrasound dated 04/04/2017 and screening mammogram dated 06/20/2017. ACR Breast Density Category b: There are scattered areas of fibroglandular density. FINDINGS: Bilateral CC and MLO views were obtained today, with additional 3D tomosynthesis, and with additional spot view of the outer LEFT breast corresponding to the area of clinical concern with overlying skin marker in place. There are stable postsurgical changes within the LEFT  breast. There are no new dominant masses, suspicious calcifications or secondary signs of malignancy within the LEFT breast. Mammographic images were processed with CAD. Targeted ultrasound is performed, evaluating the area of patient's palpable lump in the outer LEFT breast, as directed by the patient, showing a benign dystrophic calcification at the 3 o'clock axis, 9 cm from the nipple, measuring 1 cm greatest dimension, corresponding to the benign fat necrosis calcification seen on the exaggerated CC view of the LEFT breast on 04/04/2017. No suspicious solid or cystic mass is identified by ultrasound. IMPRESSION: No evidence of malignancy within the LEFT breast. Stable postsurgical changes within the outer LEFT breast. Benign dystrophic calcification (benign fat necrosis) within the outer LEFT breast at the 3 o'clock axis, corresponding to the site of patient's palpable lump. Patient may return to routine annual bilateral screening mammogram schedule. Next bilateral screening mammogram will be due in April of 2020 in conjunction with patient's routine RIGHT breast screening mammogram schedule. RECOMMENDATION: Bilateral screening mammogram in April 2020. I have discussed the findings and recommendations with the patient. Results were also provided in writing at the conclusion of the visit. If applicable, a reminder letter will be sent to the patient regarding the next appointment. BI-RADS CATEGORY  2: Benign. Electronically Signed   By: Franki Cabot M.D.   On: 12/03/2017 11:12    Assessment & Plan:   There are no diagnoses linked to this encounter.   No orders of the defined types were placed in this encounter.    Follow-up: No follow-ups on file.  Walker Kehr, MD

## 2018-03-18 ENCOUNTER — Ambulatory Visit: Payer: No Typology Code available for payment source | Admitting: Internal Medicine

## 2018-03-18 ENCOUNTER — Encounter: Payer: Self-pay | Admitting: Internal Medicine

## 2018-03-18 VITALS — BP 126/84 | HR 75 | Temp 98.1°F | Ht 65.0 in | Wt 205.0 lb

## 2018-03-18 DIAGNOSIS — F4323 Adjustment disorder with mixed anxiety and depressed mood: Secondary | ICD-10-CM | POA: Diagnosis not present

## 2018-03-18 DIAGNOSIS — Z6836 Body mass index (BMI) 36.0-36.9, adult: Secondary | ICD-10-CM

## 2018-03-18 DIAGNOSIS — F410 Panic disorder [episodic paroxysmal anxiety] without agoraphobia: Secondary | ICD-10-CM

## 2018-03-18 DIAGNOSIS — E785 Hyperlipidemia, unspecified: Secondary | ICD-10-CM

## 2018-03-18 DIAGNOSIS — F419 Anxiety disorder, unspecified: Secondary | ICD-10-CM

## 2018-03-18 NOTE — Assessment & Plan Note (Signed)
Wt Readings from Last 3 Encounters:  03/18/18 205 lb (93 kg)  01/02/18 209 lb (94.8 kg)  11/26/17 210 lb 6.4 oz (95.4 kg)

## 2018-03-18 NOTE — Assessment & Plan Note (Signed)
Taking care of mom 23 and dad w/dementia 67 and father in law 60 - CHF, CRF  Offered cardiac CT scan for calcium scoring 1/20

## 2018-03-18 NOTE — Patient Instructions (Signed)

## 2018-03-18 NOTE — Progress Notes (Signed)
Subjective:  Patient ID: Marilyn [name], female    DOB: August 21, 1955  Age: 63 y.o. MRN: [id number]  CC: No chief complaint on file.   HPI Marilyn [name] presents for COPD, depression, anxiety f/u  Outpatient Medications Prior to Visit  Medication Sig Dispense Refill  . budesonide-formoterol (SYMBICORT) 80-4.5 MCG/ACT inhaler Inhale 2 puffs into the lungs 2 (two) times daily. 1 Inhaler 11  . busPIRone (BUSPAR) 30 MG tablet Take 0.5-1 tablets (15-30 mg total) by mouth 2 (two) times daily. 60 tablet 5  . Cholecalciferol 5000 UNITS TABS Take 1 tablet by mouth 3 (three) times a week.    . clobetasol cream (TEMOVATE) 0.93 % Apply 1 application topically 2 (two) times daily. 60 g 3  . clonazePAM (KLONOPIN) 0.5 MG tablet TAKE 1/2 TO 1 TABLET BY MOUTH TWICE A DAY AS NEEDED (CMD XANAX) 60 tablet 1  . Multiple Vitamin (MULTIVITAMIN WITH MINERALS) TABS tablet Take 1 tablet by mouth daily.     No facility-administered medications prior to visit.     ROS: Review of Systems  Constitutional: Positive for fatigue. Negative for activity change, appetite change, chills and unexpected weight change.  HENT: Negative for congestion, mouth sores and sinus pressure.   Eyes: Negative for visual disturbance.  Respiratory: Negative for cough and chest tightness.   Gastrointestinal: Negative for abdominal pain and nausea.  Genitourinary: Negative for difficulty urinating, frequency and vaginal pain.  Musculoskeletal: Negative for back pain and gait problem.  Skin: Negative for pallor and rash.  Neurological: Negative for dizziness, tremors, weakness, numbness and headaches.  Psychiatric/Behavioral: Positive for sleep disturbance. Negative for confusion and suicidal ideas. The patient is nervous/anxious.     Objective:  BP 126/84 (BP Location: Left Arm, Patient Position: Sitting, Cuff Size: Large)   Pulse 75   Temp 98.1 F (36.7 C) (Oral)   Ht 5\' 5"  (1.651 m)   Wt 205 lb (93 kg)   SpO2 97%   BMI  34.11 kg/m   BP Readings from Last 3 Encounters:  03/18/18 126/84  01/02/18 132/80  11/26/17 138/74    Wt Readings from Last 3 Encounters:  03/18/18 205 lb (93 kg)  01/02/18 209 lb (94.8 kg)  11/26/17 210 lb 6.4 oz (95.4 kg)    Physical Exam Constitutional:      General: She is not in acute distress.    Appearance: She is well-developed.  HENT:     Head: Normocephalic.     Right Ear: External ear normal.     Left Ear: External ear normal.     Nose: Nose normal.  Eyes:     General:        Right eye: No discharge.        Left eye: No discharge.     Conjunctiva/sclera: Conjunctivae normal.     Pupils: Pupils are equal, round, and reactive to light.  Neck:     Musculoskeletal: Normal range of motion and neck supple.     Thyroid: No thyromegaly.     Vascular: No JVD.     Trachea: No tracheal deviation.  Cardiovascular:     Rate and Rhythm: Normal rate and regular rhythm.     Heart sounds: Normal heart sounds.  Pulmonary:     Effort: No respiratory distress.     Breath sounds: No stridor. No wheezing.  Abdominal:     General: Bowel sounds are normal. There is no distension.     Palpations: Abdomen is soft. There is no mass.  Tenderness: There is no abdominal tenderness. There is no guarding or rebound.  Musculoskeletal:        General: No tenderness.  Lymphadenopathy:     Cervical: No cervical adenopathy.  Skin:    Findings: No erythema or rash.  Neurological:     Cranial Nerves: No cranial nerve deficit.     Motor: No abnormal muscle tone.     Coordination: Coordination normal.     Deep Tendon Reflexes: Reflexes normal.  Psychiatric:        Behavior: Behavior normal.        Thought Content: Thought content normal.        Judgment: Judgment normal.    Obese   Lab Results  Component Value Date   WBC 7.4 09/03/2017   HGB 13.0 09/03/2017   HCT 40.1 09/03/2017   PLT 218.0 09/03/2017   GLUCOSE 98 09/03/2017   CHOL 210 (H) 09/03/2017   TRIG 105.0  09/03/2017   HDL 66.40 09/03/2017   LDLDIRECT 118.9 12/18/2011   LDLCALC 122 (H) 09/03/2017   ALT 13 09/03/2017   AST 11 09/03/2017   NA 138 09/03/2017   K 4.1 09/03/2017   CL 106 09/03/2017   CREATININE 0.71 09/03/2017   BUN 10 09/03/2017   CO2 24 09/03/2017   TSH 1.41 09/03/2017    US Breast [place] Axilla  Result Date: 12/03/2017 CLINICAL DATA:  History of LEFT breast cancer in 2013 status post lumpectomy. Patient presented on 04/04/2017 with complaints of a palpable mass at the lumpectomy site. Corresponding diagnostic mammogram and ultrasound report described no evidence of malignancy and the palpable lump within the outer LEFT breast corresponded to a 9 mm benign dystrophic calcification (fat necrosis). Patient returns today with a persistent LEFT breast nodule, possibly related to the previous dystrophic calcification in the lateral LEFT breast. EXAM: DIGITAL DIAGNOSTIC LEFT MAMMOGRAM WITH CAD AND TOMO ULTRASOUND LEFT BREAST COMPARISON:  Previous exams including diagnostic mammogram and ultrasound dated 04/04/2017 and screening mammogram dated 06/20/2017. ACR Breast Density Category b: There are scattered areas of fibroglandular density. FINDINGS: Bilateral CC and MLO views were obtained today, with additional 3D tomosynthesis, and with additional spot view of the outer LEFT breast corresponding to the area of clinical concern with overlying skin marker in place. There are stable postsurgical changes within the LEFT breast. There are no new dominant masses, suspicious calcifications or secondary signs of malignancy within the LEFT breast. Mammographic images were processed with CAD. Targeted ultrasound is performed, evaluating the area of patient's palpable lump in the outer LEFT breast, as directed by the patient, showing a benign dystrophic calcification at the 3 o'clock axis, 9 cm from the nipple, measuring 1 cm greatest dimension, corresponding to the benign fat necrosis  calcification seen on the exaggerated CC view of the LEFT breast on 04/04/2017. No suspicious solid or cystic mass is identified by ultrasound. IMPRESSION: No evidence of malignancy within the LEFT breast. Stable postsurgical changes within the outer LEFT breast. Benign dystrophic calcification (benign fat necrosis) within the outer LEFT breast at the 3 o'clock axis, corresponding to the site of patient's palpable lump. Patient may return to routine annual bilateral screening mammogram schedule. Next bilateral screening mammogram will be due in April of 2020 in conjunction with patient's routine RIGHT breast screening mammogram schedule. RECOMMENDATION: Bilateral screening mammogram in April 2020. I have discussed the findings and recommendations with the patient. Results were also provided in writing at the conclusion of the visit. If applicable, a  reminder letter will be sent to the patient regarding the next appointment. BI-RADS CATEGORY  2: Benign. Electronically Signed   By: Franki Cabot M.D.   On: 12/03/2017 11:12   Mm Diag Breast Tomo Uni Left  Result Date: 12/03/2017 CLINICAL DATA:  History of LEFT breast cancer in 2013 status post lumpectomy. Patient presented on 04/04/2017 with complaints of a palpable mass at the lumpectomy site. Corresponding diagnostic mammogram and ultrasound report described no evidence of malignancy and the palpable lump within the outer LEFT breast corresponded to a 9 mm benign dystrophic calcification (fat necrosis). Patient returns today with a persistent LEFT breast nodule, possibly related to the previous dystrophic calcification in the lateral LEFT breast. EXAM: DIGITAL DIAGNOSTIC LEFT MAMMOGRAM WITH CAD AND TOMO ULTRASOUND LEFT BREAST COMPARISON:  Previous exams including diagnostic mammogram and ultrasound dated 04/04/2017 and screening mammogram dated 06/20/2017. ACR Breast Density Category b: There are scattered areas of fibroglandular density. FINDINGS: Bilateral CC  and MLO views were obtained today, with additional 3D tomosynthesis, and with additional spot view of the outer LEFT breast corresponding to the area of clinical concern with overlying skin marker in place. There are stable postsurgical changes within the LEFT breast. There are no new dominant masses, suspicious calcifications or secondary signs of malignancy within the LEFT breast. Mammographic images were processed with CAD. Targeted ultrasound is performed, evaluating the area of patient's palpable lump in the outer LEFT breast, as directed by the patient, showing a benign dystrophic calcification at the 3 o'clock axis, 9 cm from the nipple, measuring 1 cm greatest dimension, corresponding to the benign fat necrosis calcification seen on the exaggerated CC view of the LEFT breast on 04/04/2017. No suspicious solid or cystic mass is identified by ultrasound. IMPRESSION: No evidence of malignancy within the LEFT breast. Stable postsurgical changes within the outer LEFT breast. Benign dystrophic calcification (benign fat necrosis) within the outer LEFT breast at the 3 o'clock axis, corresponding to the site of patient's palpable lump. Patient may return to routine annual bilateral screening mammogram schedule. Next bilateral screening mammogram will be due in April of 2020 in conjunction with patient's routine RIGHT breast screening mammogram schedule. RECOMMENDATION: Bilateral screening mammogram in April 2020. I have discussed the findings and recommendations with the patient. Results were also provided in writing at the conclusion of the visit. If applicable, a reminder letter will be sent to the patient regarding the next appointment. BI-RADS CATEGORY  2: Benign. Electronically Signed   By: Franki Cabot M.D.   On: 12/03/2017 11:12    Assessment & Plan:   There are no diagnoses linked to this encounter.   No orders of the defined types were placed in this encounter.    Follow-up: No follow-ups on  file.  Walker Kehr, MD

## 2018-03-18 NOTE — Assessment & Plan Note (Signed)
  On Buspar, bupropion Clonazepam prn  Potential benefits of a long term benzodiazepines  use as well as potential risks  and complications were explained to the patient and were aknowledged.

## 2018-03-18 NOTE — Assessment & Plan Note (Signed)
Clonazepam - wean off Cont other Rx

## 2018-03-18 NOTE — Assessment & Plan Note (Signed)
Offered cardiac CT scan for calcium scoring 

## 2018-03-22 ENCOUNTER — Other Ambulatory Visit: Payer: Self-pay | Admitting: Internal Medicine

## 2018-03-22 MED ORDER — BUDESONIDE-FORMOTEROL FUMARATE 80-4.5 MCG/ACT IN AERO: 2.0000 | INHALATION_SPRAY | Freq: Two times a day (BID) | RESPIRATORY_TRACT | 11 refills | Status: DC

## 2018-05-13 ENCOUNTER — Other Ambulatory Visit: Payer: Self-pay | Admitting: Internal Medicine

## 2018-06-06 ENCOUNTER — Other Ambulatory Visit: Payer: Self-pay | Admitting: Obstetrics & Gynecology

## 2018-06-06 DIAGNOSIS — Z1231 Encounter for screening mammogram for malignant neoplasm of breast: Secondary | ICD-10-CM

## 2018-06-17 IMAGING — MG DIGITAL SCREENING BILATERAL MAMMOGRAM WITH TOMO AND CAD
8 series · 8 of 24 positions shown · non-contrast
Comparison: Previous exam(s).

CLINICAL DATA: Screening.

EXAM:
DIGITAL SCREENING BILATERAL MAMMOGRAM WITH TOMO AND CAD

[R CC synth-2D]
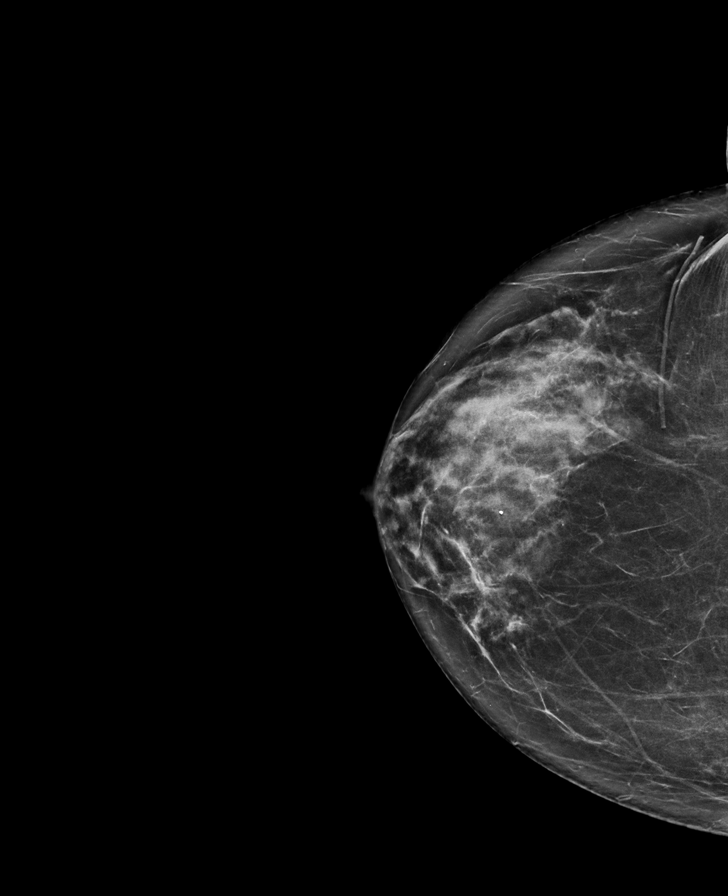

[R MLO synth-2D]
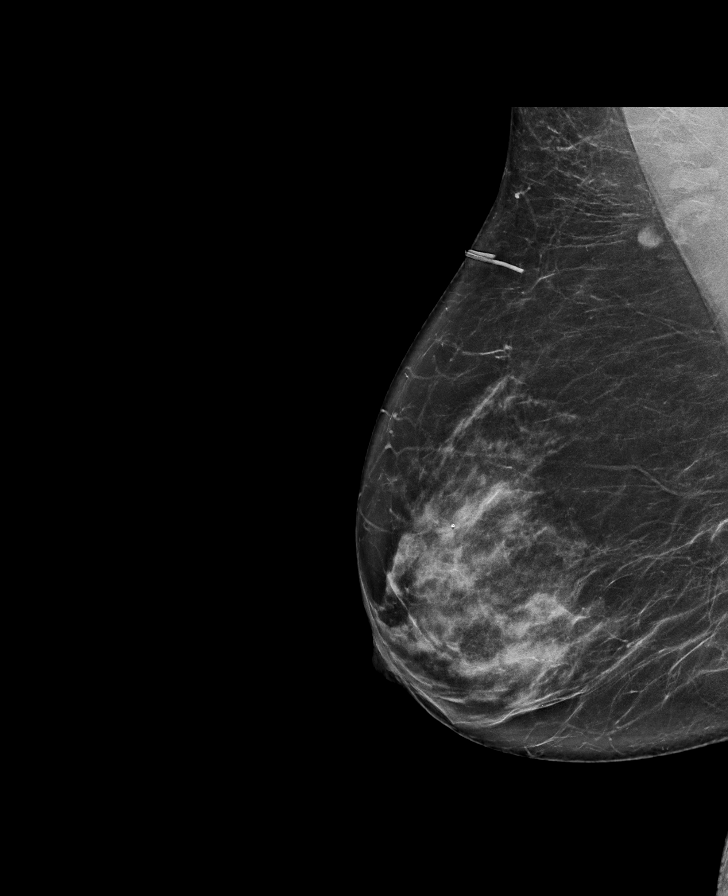

[L CC synth-2D]
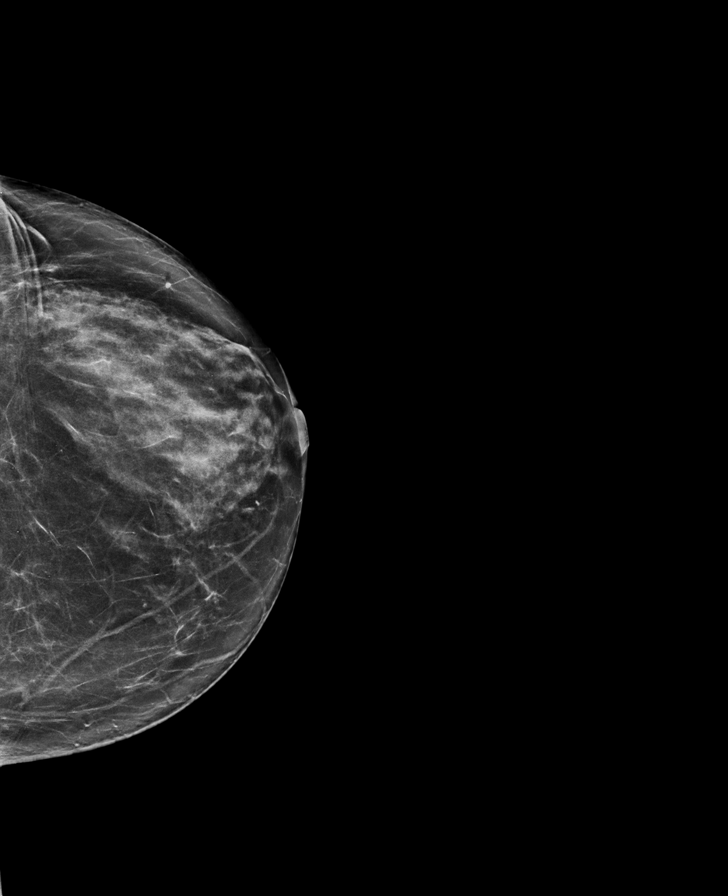

[L MLO synth-2D]
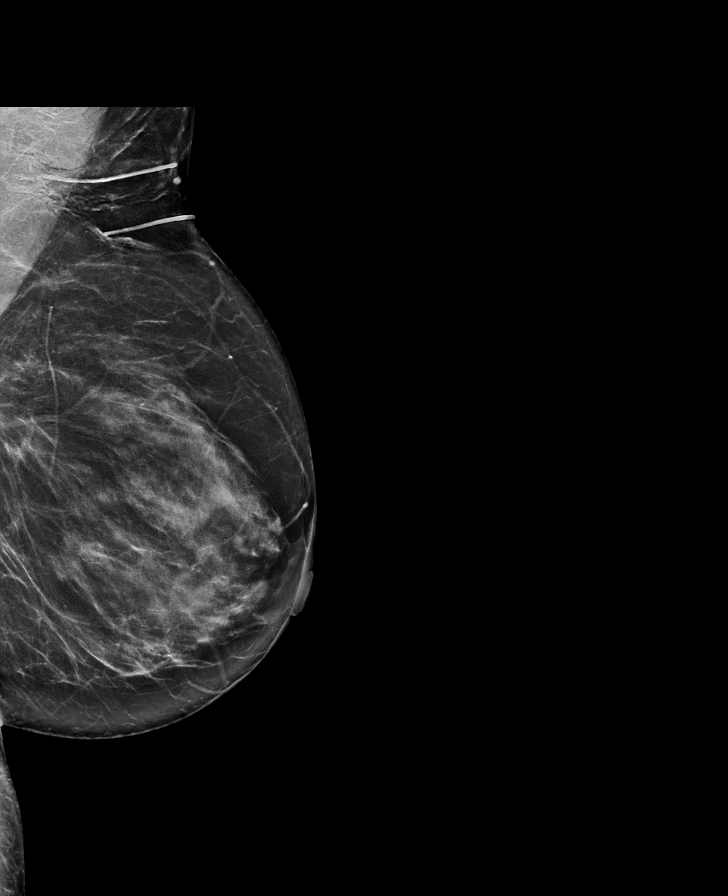

[L CC tomo · tomo slice 37/73.0]
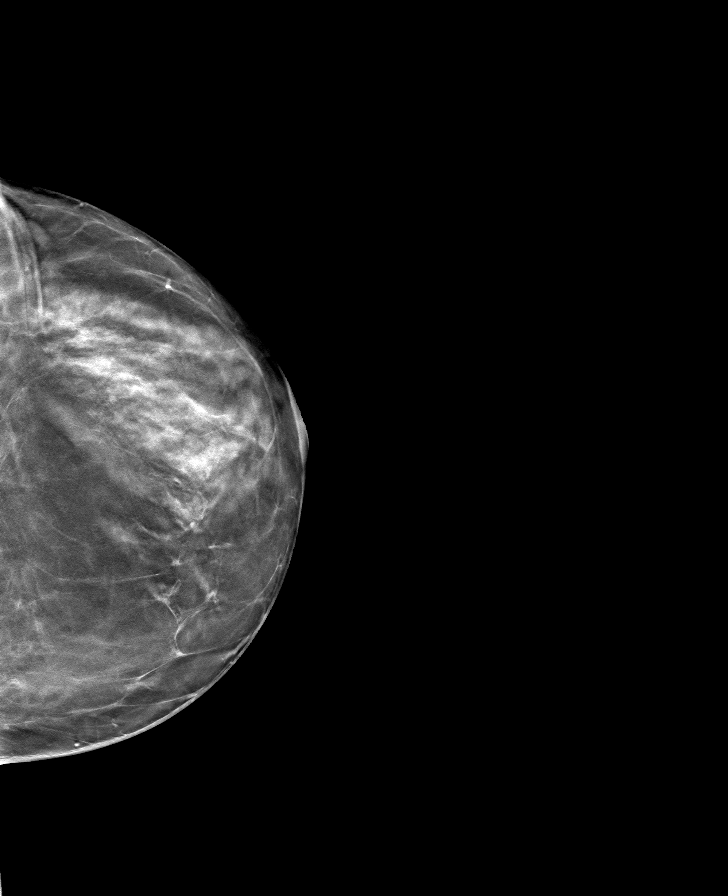

[R CC tomo · tomo slice 35/70.0]
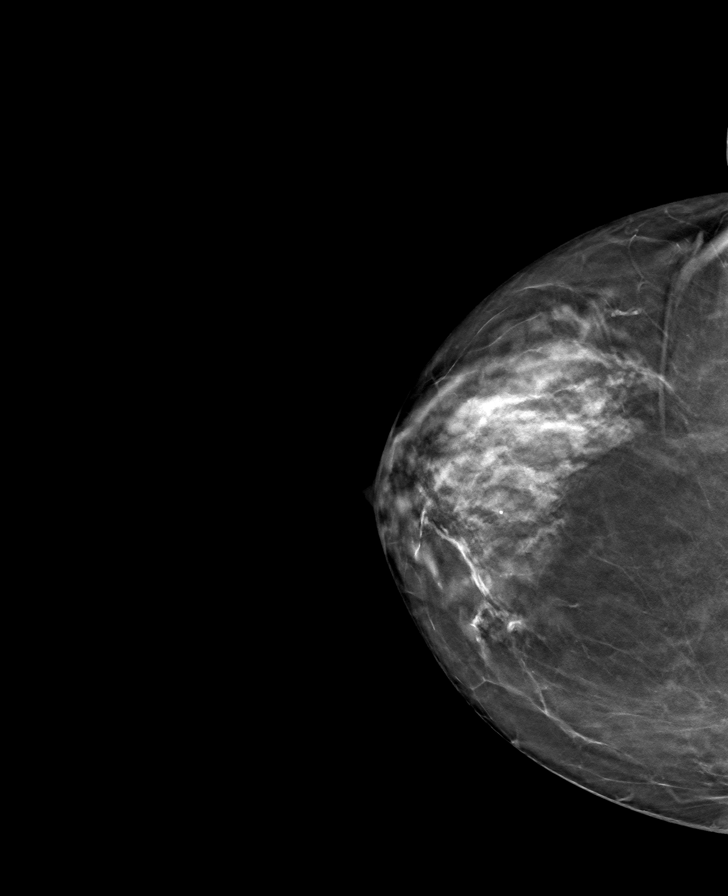

[L MLO tomo · tomo slice 39/76.0]
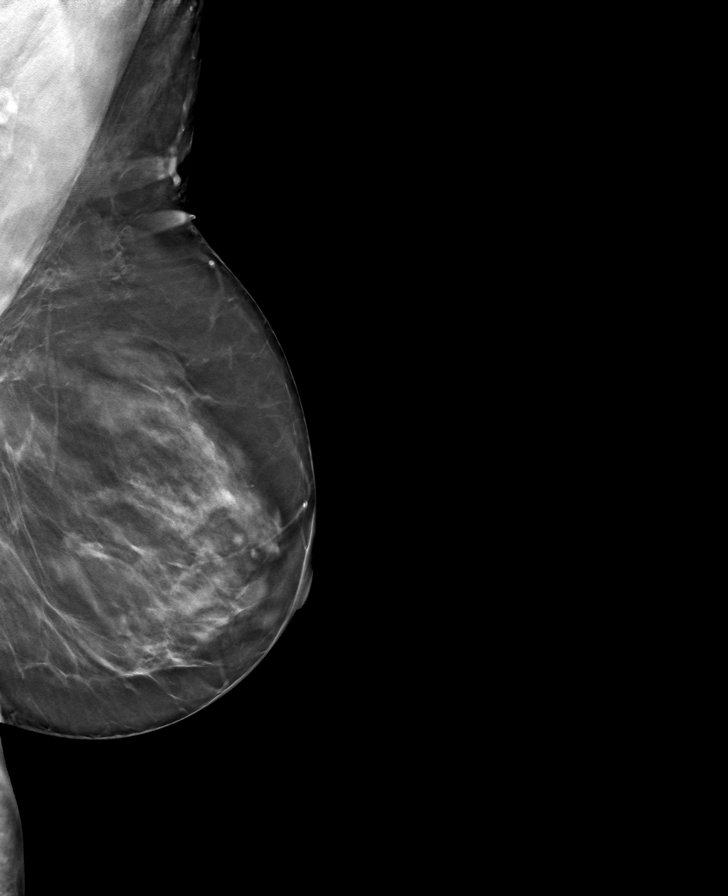

[R MLO tomo · tomo slice 39/77.0]
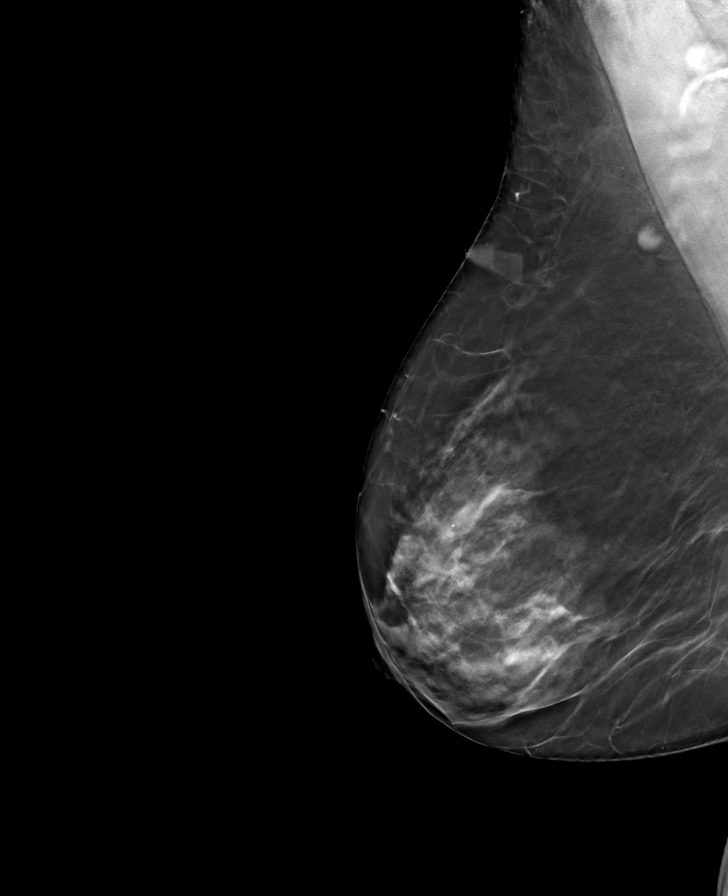

[8 of 24 positions shown; findings below may reference images not displayed]

ACR Breast Density Category c: The breast tissue is heterogeneously
dense, which may obscure small masses.
FINDINGS: There are no findings suspicious for malignancy. Images were
processed with CAD.
IMPRESSION: No mammographic evidence of malignancy. A result letter of this
screening mammogram will be mailed directly to the patient.

RECOMMENDATION:
Screening mammogram in one year. (Code:FT-U-LHB)

BI-RADS CATEGORY  1: Negative.

## 2018-08-05 ENCOUNTER — Ambulatory Visit
Admission: RE | Admit: 2018-08-05 | Discharge: 2018-08-05 | Disposition: A | Discharge status: Home / Self Care | Payer: No Typology Code available for payment source | Source: Ambulatory Visit | Attending: Obstetrics & Gynecology | Admitting: Obstetrics & Gynecology

## 2018-08-05 ENCOUNTER — Other Ambulatory Visit: Payer: Self-pay

## 2018-08-05 DIAGNOSIS — Z1231 Encounter for screening mammogram for malignant neoplasm of breast: Secondary | ICD-10-CM

## 2018-08-06 ENCOUNTER — Other Ambulatory Visit: Payer: Self-pay | Admitting: Obstetrics & Gynecology

## 2018-08-06 DIAGNOSIS — N63 Unspecified lump in unspecified breast: Secondary | ICD-10-CM

## 2018-08-13 ENCOUNTER — Ambulatory Visit
Admission: RE | Admit: 2018-08-13 | Discharge: 2018-08-13 | Disposition: A | Discharge status: Home / Self Care | Payer: No Typology Code available for payment source | Source: Ambulatory Visit | Attending: Obstetrics & Gynecology | Admitting: Obstetrics & Gynecology

## 2018-08-13 ENCOUNTER — Other Ambulatory Visit: Payer: Self-pay

## 2018-08-13 DIAGNOSIS — N63 Unspecified lump in unspecified breast: Secondary | ICD-10-CM

## 2018-09-18 ENCOUNTER — Ambulatory Visit (INDEPENDENT_AMBULATORY_CARE_PROVIDER_SITE_OTHER): Payer: No Typology Code available for payment source | Admitting: Internal Medicine

## 2018-09-18 ENCOUNTER — Other Ambulatory Visit (INDEPENDENT_AMBULATORY_CARE_PROVIDER_SITE_OTHER): Payer: No Typology Code available for payment source

## 2018-09-18 ENCOUNTER — Other Ambulatory Visit: Payer: Self-pay

## 2018-09-18 ENCOUNTER — Encounter: Payer: Self-pay | Admitting: Internal Medicine

## 2018-09-18 VITALS — BP 130/86 | HR 79 | Temp 98.4°F | Ht 65.0 in | Wt 207.0 lb

## 2018-09-18 DIAGNOSIS — Z0000 Encounter for general adult medical examination without abnormal findings: Secondary | ICD-10-CM

## 2018-09-18 DIAGNOSIS — F4323 Adjustment disorder with mixed anxiety and depressed mood: Secondary | ICD-10-CM | POA: Diagnosis not present

## 2018-09-18 DIAGNOSIS — Z23 Encounter for immunization: Secondary | ICD-10-CM | POA: Diagnosis not present

## 2018-09-18 DIAGNOSIS — F17200 Nicotine dependence, unspecified, uncomplicated: Secondary | ICD-10-CM

## 2018-09-18 DIAGNOSIS — E559 Vitamin D deficiency, unspecified: Secondary | ICD-10-CM | POA: Diagnosis not present

## 2018-09-18 DIAGNOSIS — F419 Anxiety disorder, unspecified: Secondary | ICD-10-CM

## 2018-09-18 LAB — CBC WITH DIFFERENTIAL/PLATELET
Basophils Absolute: 0.1 10*3/uL (ref 0.0–0.1)
Basophils Relative: 0.9 % (ref 0.0–3.0)
Eosinophils Absolute: 0.2 10*3/uL (ref 0.0–0.7)
Eosinophils Relative: 2 % (ref 0.0–5.0)
HCT: 40.8 % (ref 36.0–46.0)
Hemoglobin: 13 g/dL (ref 12.0–15.0)
Lymphocytes Relative: 21.4 % (ref 12.0–46.0)
Lymphs Abs: 1.9 10*3/uL (ref 0.7–4.0)
MCHC: 31.9 g/dL (ref 30.0–36.0)
MCV: 75.2 fl — ABNORMAL LOW (ref 78.0–100.0)
Monocytes Absolute: 0.5 10*3/uL (ref 0.1–1.0)
Monocytes Relative: 5.7 % (ref 3.0–12.0)
Neutro Abs: 6.1 10*3/uL (ref 1.4–7.7)
Neutrophils Relative %: 70 % (ref 43.0–77.0)
Platelets: 193 10*3/uL (ref 150.0–400.0)
RBC: 5.42 Mil/uL — ABNORMAL HIGH (ref 3.87–5.11)
RDW: 15 % (ref 11.5–15.5)
WBC: 8.7 10*3/uL (ref 4.0–10.5)

## 2018-09-18 LAB — BASIC METABOLIC PANEL
BUN: 10 mg/dL (ref 6–23)
CO2: 24 mEq/L (ref 19–32)
Calcium: 9.9 mg/dL (ref 8.4–10.5)
Chloride: 106 mEq/L (ref 96–112)
Creatinine, Ser: 0.71 mg/dL (ref 0.40–1.20)
GFR: 100.11 mL/min (ref >60.00)
Glucose, Bld: 93 mg/dL (ref 70–99)
Potassium: 3.9 mEq/L (ref 3.5–5.1)
Sodium: 138 mEq/L (ref 135–145)

## 2018-09-18 LAB — URINALYSIS
Bilirubin Urine: NEGATIVE
Ketones, ur: NEGATIVE
Leukocytes,Ua: NEGATIVE
Nitrite: NEGATIVE
Specific Gravity, Urine: 1.02 (ref 1.000–1.030)
Total Protein, Urine: NEGATIVE
Urine Glucose: NEGATIVE
Urobilinogen, UA: 0.2 (ref 0.0–1.0)
pH: 6.5 (ref 5.0–8.0)

## 2018-09-18 LAB — TSH: TSH: 1.33 u[IU]/mL (ref 0.35–4.50)

## 2018-09-18 LAB — HEPATIC FUNCTION PANEL
ALT: 13 U/L (ref 0–35)
AST: 12 U/L (ref 0–37)
Albumin: 4.4 g/dL (ref 3.5–5.2)
Alkaline Phosphatase: 78 U/L (ref 39–117)
Bilirubin, Direct: 0.1 mg/dL (ref 0.0–0.3)
Total Bilirubin: 0.5 mg/dL (ref 0.2–1.2)
Total Protein: 7.5 g/dL (ref 6.0–8.3)

## 2018-09-18 LAB — LIPID PANEL
Cholesterol: 226 mg/dL — ABNORMAL HIGH (ref 0–200)
HDL: 72.9 mg/dL (ref >39.00)
LDL Cholesterol: 132 mg/dL — ABNORMAL HIGH (ref 0–99)
NonHDL: 152.61
Total CHOL/HDL Ratio: 3
Triglycerides: 103 mg/dL (ref 0.0–149.0)
VLDL: 20.6 mg/dL (ref 0.0–40.0)

## 2018-09-18 MED ORDER — CLONAZEPAM 0.5 MG PO TABS: ORAL_TABLET | ORAL | 2 refills | Status: DC

## 2018-09-18 NOTE — Progress Notes (Signed)
Subjective:  Patient ID: Marilyn [name], female    DOB: 01-03-1956  Age: 63 y.o. MRN: [id number]  CC: No chief complaint on file.   HPI Daesia Zylka Sopp presents for anxiety, depressed, stress f/u Well exam  Outpatient Medications Prior to Visit  Medication Sig Dispense Refill   budesonide-formoterol (SYMBICORT) 80-4.5 MCG/ACT inhaler Inhale 2 puffs into the lungs 2 (two) times daily. 1 Inhaler 11   busPIRone (BUSPAR) 30 MG tablet Take 0.5-1 tablets (15-30 mg total) by mouth 2 (two) times daily. 60 tablet 5   Cholecalciferol 5000 UNITS TABS Take 1 tablet by mouth 3 (three) times a week.     clobetasol cream (TEMOVATE) 1.77 % Apply 1 application topically 2 (two) times daily. 60 g 3   clonazePAM (KLONOPIN) 0.5 MG tablet TAKE 1/2 TO 1 TABLET BY MOUTH TWICE A DAY AS NEEDED 60 tablet 1   Multiple Vitamin (MULTIVITAMIN WITH MINERALS) TABS tablet Take 1 tablet by mouth daily.     No facility-administered medications prior to visit.     ROS: Review of Systems  Constitutional: Negative for activity change, appetite change, chills, fatigue and unexpected weight change.  HENT: Negative for congestion, mouth sores and sinus pressure.   Eyes: Negative for visual disturbance.  Respiratory: Negative for cough and chest tightness.   Gastrointestinal: Negative for abdominal pain and nausea.  Genitourinary: Negative for difficulty urinating, frequency and vaginal pain.  Musculoskeletal: Negative for back pain and gait problem.  Skin: Negative for pallor and rash.  Neurological: Negative for dizziness, tremors, weakness, numbness and headaches.  Psychiatric/Behavioral: Negative for confusion, sleep disturbance and suicidal ideas.    Objective:  BP 130/86    Pulse 79    Temp 98.4 F (36.9 C) (Oral)    Ht 5\' 5"  (1.651 m)    Wt 207 lb (93.9 kg)    SpO2 97%    BMI 34.45 kg/m   BP Readings from Last 3 Encounters:  09/18/18 130/86  03/18/18 126/84  01/02/18 132/80    Wt  Readings from Last 3 Encounters:  09/18/18 207 lb (93.9 kg)  03/18/18 205 lb (93 kg)  01/02/18 209 lb (94.8 kg)    Physical Exam Constitutional:      General: She is not in acute distress.    Appearance: She is well-developed.  HENT:     Head: Normocephalic.     Right Ear: External ear normal.     Left Ear: External ear normal.     Nose: Nose normal.  Eyes:     General:        Right eye: No discharge.        Left eye: No discharge.     Conjunctiva/sclera: Conjunctivae normal.     Pupils: Pupils are equal, round, and reactive to light.  Neck:     Musculoskeletal: Normal range of motion and neck supple.     Thyroid: No thyromegaly.     Vascular: No JVD.     Trachea: No tracheal deviation.  Cardiovascular:     Rate and Rhythm: Normal rate and regular rhythm.     Heart sounds: Normal heart sounds.  Pulmonary:     Effort: No respiratory distress.     Breath sounds: No stridor. No wheezing.  Abdominal:     General: Bowel sounds are normal. There is no distension.     Palpations: Abdomen is soft. There is no mass.     Tenderness: There is no abdominal tenderness. There is no guarding or  rebound.  Musculoskeletal:        General: No tenderness.  Lymphadenopathy:     Cervical: No cervical adenopathy.  Skin:    Findings: No erythema or rash.  Neurological:     Cranial Nerves: No cranial nerve deficit.     Motor: No abnormal muscle tone.     Coordination: Coordination normal.     Deep Tendon Reflexes: Reflexes normal.  Psychiatric:        Behavior: Behavior normal.        Thought Content: Thought content normal.        Judgment: Judgment normal.     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 Patient presents with a palpable lump at the edge of the lumpectomy site which she states may have increased in size since her prior imaging in October, 2019. Annual evaluation, RIGHT breast. EXAM: DIGITAL DIAGNOSTIC BILATERAL MAMMOGRAM WITH CAD AND TOMO ULTRASOUND LEFT BREAST COMPARISON:  Previous exam(s). ACR Breast Density Category c: The breast tissue is heterogeneously dense, which may obscure small masses. FINDINGS: Tomosynthesis and synthesized full field CC and MLO views of both breasts were obtained. Tomosynthesis and synthesized spot compression tangential view of the area of concern in the LEFT breast was also obtained. Corresponding to the palpable concern is a densely calcified benign oil cyst at the lumpectomy site in the [place] of the LEFT breast. No new or suspicious findings elsewhere in the LEFT breast. No findings suspicious for malignancy in the RIGHT breast. Mammographic images were processed with CAD. On correlative physical exam, there is a firm palpable approximate 1 cm mass in the far OUTER LEFT breast. Targeted LEFT breast ultrasound is performed, showing the previously identified benign calcified oil cyst at the 3 o'clock position approximately cm from the nipple measuring maximally approximately 1.1 cm, unchanged from prior ultrasounds, corresponding to the palpable concern. No suspicious solid mass or abnormal acoustic shadowing is identified IMPRESSION: 1. No mammographic or sonographic evidence of malignancy involving the LEFT breast. 2. No mammographic evidence of malignancy involving the RIGHT breast. 3. Stable benign densely  calcified oil cyst at the edge of the lumpectomy site in the OUTER LEFT breast which accounts for the palpable concern. RECOMMENDATION: Screening mammogram in one year.(Code:SM-B-01Y) I have discussed the findings and recommendations with the patient. Results were also provided in writing at the conclusion of the visit. If applicable, a reminder letter will be sent to the patient regarding the next appointment. BI-RADS CATEGORY  2: Benign. Electronically Signed   By: Evangeline Dakin M.D.   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 Patient presents with a palpable lump at the edge of the lumpectomy site which she states may have increased in size since her prior imaging in October, 2019. Annual evaluation, RIGHT breast. EXAM: DIGITAL DIAGNOSTIC BILATERAL MAMMOGRAM WITH CAD AND TOMO ULTRASOUND LEFT BREAST COMPARISON:  Previous exam(s). ACR Breast Density Category c: The breast tissue is heterogeneously dense, which may obscure small masses. FINDINGS: Tomosynthesis and synthesized full field CC and MLO views of both breasts were obtained. Tomosynthesis and synthesized spot compression tangential view of the area of concern in the LEFT breast was also obtained. Corresponding to the palpable concern is a densely calcified benign oil cyst at the lumpectomy site in the [place] of the LEFT breast. No new or suspicious findings elsewhere in the LEFT breast. No findings suspicious for malignancy in the RIGHT breast. Mammographic images were processed with CAD. On correlative physical exam, there is a firm palpable approximate 1 cm mass in the far OUTER LEFT breast. Targeted LEFT breast ultrasound is performed, showing the previously identified benign calcified oil cyst at the 3 o'clock position approximately cm from the nipple measuring maximally  approximately 1.1 cm, unchanged from prior ultrasounds, corresponding to the palpable concern. No suspicious solid mass or abnormal acoustic shadowing is identified IMPRESSION: 1. No mammographic or sonographic evidence of malignancy involving the LEFT breast. 2. No mammographic evidence of malignancy involving the RIGHT breast. 3. Stable benign densely calcified oil cyst at the edge of the lumpectomy site in the OUTER LEFT breast which accounts for the palpable concern. RECOMMENDATION: Screening mammogram in one year.(Code:SM-B-01Y) I have discussed the findings and recommendations with the patient. Results were also provided in writing at the conclusion of the visit. If applicable, a reminder letter will be sent to the patient regarding the next appointment. BI-RADS CATEGORY  2: Benign. Electronically Signed   By: Evangeline Dakin M.D.   On: 08/13/2018 08:53    Assessment & Plan:   There are no diagnoses linked to this encounter.   No orders of the defined types were placed in this encounter.    Follow-up: No follow-ups on file.  Walker Kehr, MD

## 2018-09-18 NOTE — Assessment & Plan Note (Signed)
Replacement options discussed

## 2018-09-18 NOTE — Patient Instructions (Signed)
Sign up for [employer] ( via [employer] on your phone or your ipad). If you don't have a Art therapist card  - go to [employer] branch. They will set you up in 15 minutes. It is free. You can check out books to read and to listen, check out magazines and newspapers, movies etc.     These suggestions will probably help you to improve your metabolism if you are not overweight and to lose weight if you are overweight: 1.  Reduce your consumption of sugars and starches.  Eliminate high fructose corn syrup from your diet.  Reduce your consumption of processed foods.  For desserts try to have seasonal fruits, berries with with green, nuts, cheeses or dark chocolate with more than 70% cacao. 2.  Do not snack 3.  You do not have to eat breakfast.  If you choose to have breakfast-eat plain greek yogurt, eggs, oatmeal (without sugar) 4.  Drink water, freshly brewed unsweetened tea (green, black or herbal) or coffee.  Do not drink sodas including diet sodas , juices, beverages sweetened with artificial sweeteners. 5.  Reduce your consumption of refined grains. 6.  Avoid protein drinks such as Optifast, Slim fast etc. Eat chicken, fish, meat, dairy and beans for your sources of protein 7.  Natural unprocessed fats like cold pressed virgin olive oil, butter, coconut oil are good for you.  Eat avocados 8.  Increase your consumption of fiber.  Fruits, berries, vegetables, whole grains, flaxseeds, Chia seeds, beans, popcorn, nuts, oatmeal are good sources of fiber 9.  Use vinegar in your diet, i.e. apple cider vinegar, red wine or balsamic vinegar 10.  You can try fasting.  For example you can skip breakfast and lunch every other day (24-hour fast) 11.  Stress reduction, good night sleep, relaxation, meditation, yoga and other physical activity is likely to help you to maintain low weight too. 12.  If you drink alcohol, limit your alcohol intake to no more than 2 drinks a day.   Mediterranean diet is good for  you. ([employer] has a typical Mediterranean cuisine menu) The Mediterranean diet is a way of eating based on the traditional cuisine of countries bordering the [employer]. While there is no single definition of the Mediterranean diet, it is typically high in vegetables, fruits, whole grains, beans, nut and seeds, and olive oil. The main components of Mediterranean diet include: Marland Kitchen Daily consumption of vegetables, fruits, whole grains and healthy fats  . Weekly intake of fish, poultry, beans and eggs  . Moderate portions of dairy products  . Limited intake of red meat Other important elements of the Mediterranean diet are sharing meals with family and friends, enjoying a glass of red wine and being physically active. Health benefits of a Mediterranean diet: A traditional Mediterranean diet consisting of large quantities of fresh fruits and vegetables, nuts, fish and olive oil-coupled with physical activity-can reduce your risk of serious mental and physical health problems by: Preventing heart disease and strokes. Following a Mediterranean diet limits your intake of refined breads, processed foods, and red meat, and encourages drinking red wine instead of hard liquor-all factors that can help prevent heart disease and stroke. Keeping you agile. If you're an older adult, the nutrients gained with a Mediterranean diet may reduce your risk of developing muscle weakness and other signs of frailty by about 70 percent. Reducing the risk of Alzheimer's. Research suggests that the [place] diet may improve cholesterol, blood sugar levels, and overall blood  vessel health, which in turn may reduce your risk of Alzheimer's disease or dementia. Halving the risk of Parkinson's disease. The high levels of antioxidants in the Mediterranean diet can prevent cells from undergoing a damaging process called oxidative stress, thereby cutting the risk of Parkinson's disease in half. Increasing longevity. By  reducing your risk of developing heart disease or cancer with the Mediterranean diet, you're reducing your risk of death at any age by 20%. Protecting against type 2 diabetes. A Mediterranean diet is rich in fiber which digests slowly, prevents huge swings in blood sugar, and can help you maintain a healthy weight.    Cabbage soup recipe that will not make you gain weight: Take 1 small head of cabbage, 1 average pack of celery, 4 green peppers, 4 onions, 2 cans diced tomatoes (they are not available without salt), salt and spices to taste.  Chop cabbage, celery, peppers and onions.  And tomatoes and 2-2.5 liters (2.5 quarts) of water so that it would just cover the vegetables.  Bring to boil.  Add spices and salt.  Turn heat to low/medium and simmer for 20-25 minutes.  Naturally, you can make a smaller batch and change some of the ingredients.

## 2018-09-18 NOTE — Assessment & Plan Note (Addendum)
We discussed age appropriate health related issues, including available/recomended screening tests and vaccinations. We discussed a need for adhering to healthy diet and exercise. Labs were ordered to be later reviewed . All questions were answered. Trying to quit smoking Colon 2016 due in 2021

## 2018-09-18 NOTE — Assessment & Plan Note (Signed)
Buspar

## 2018-09-18 NOTE — Assessment & Plan Note (Signed)
Vit D 

## 2018-09-18 NOTE — Addendum Note (Signed)
Addended by: Cresenciano Lick on: 09/18/2018 10:55 AM   Modules accepted: Orders

## 2018-11-12 ENCOUNTER — Encounter

## 2018-11-12 ENCOUNTER — Other Ambulatory Visit (INDEPENDENT_AMBULATORY_CARE_PROVIDER_SITE_OTHER): Payer: No Typology Code available for payment source

## 2018-11-12 ENCOUNTER — Encounter: Payer: Self-pay | Admitting: Internal Medicine

## 2018-11-12 ENCOUNTER — Ambulatory Visit (INDEPENDENT_AMBULATORY_CARE_PROVIDER_SITE_OTHER): Payer: No Typology Code available for payment source | Admitting: Internal Medicine

## 2018-11-12 ENCOUNTER — Other Ambulatory Visit: Payer: Self-pay

## 2018-11-12 VITALS — BP 124/76 | HR 73 | Temp 98.4°F | Ht 65.0 in | Wt 205.0 lb

## 2018-11-12 DIAGNOSIS — Z23 Encounter for immunization: Secondary | ICD-10-CM

## 2018-11-12 DIAGNOSIS — E559 Vitamin D deficiency, unspecified: Secondary | ICD-10-CM

## 2018-11-12 DIAGNOSIS — F410 Panic disorder [episodic paroxysmal anxiety] without agoraphobia: Secondary | ICD-10-CM

## 2018-11-12 DIAGNOSIS — R1084 Generalized abdominal pain: Secondary | ICD-10-CM

## 2018-11-12 LAB — URINALYSIS
Bilirubin Urine: NEGATIVE
Ketones, ur: NEGATIVE
Leukocytes,Ua: NEGATIVE
Nitrite: NEGATIVE
Specific Gravity, Urine: >=1.03 — AB (ref 1.000–1.030)
Total Protein, Urine: NEGATIVE
Urine Glucose: NEGATIVE
Urobilinogen, UA: 0.2 (ref 0.0–1.0)
pH: 5.5 (ref 5.0–8.0)

## 2018-11-12 LAB — BASIC METABOLIC PANEL
BUN: 16 mg/dL (ref 6–23)
CO2: 27 mEq/L (ref 19–32)
Calcium: 9.9 mg/dL (ref 8.4–10.5)
Chloride: 106 mEq/L (ref 96–112)
Creatinine, Ser: 0.8 mg/dL (ref 0.40–1.20)
GFR: 87.19 mL/min (ref >60.00)
Glucose, Bld: 96 mg/dL (ref 70–99)
Potassium: 4 mEq/L (ref 3.5–5.1)
Sodium: 139 mEq/L (ref 135–145)

## 2018-11-12 LAB — CBC WITH DIFFERENTIAL/PLATELET
Basophils Absolute: 0 10*3/uL (ref 0.0–0.1)
Basophils Relative: 0.4 % (ref 0.0–3.0)
Eosinophils Absolute: 0.3 10*3/uL (ref 0.0–0.7)
Eosinophils Relative: 3.9 % (ref 0.0–5.0)
HCT: 39.8 % (ref 36.0–46.0)
Hemoglobin: 12.9 g/dL (ref 12.0–15.0)
Lymphocytes Relative: 23.9 % (ref 12.0–46.0)
Lymphs Abs: 1.9 10*3/uL (ref 0.7–4.0)
MCHC: 32.3 g/dL (ref 30.0–36.0)
MCV: 75.2 fl — ABNORMAL LOW (ref 78.0–100.0)
Monocytes Absolute: 0.6 10*3/uL (ref 0.1–1.0)
Monocytes Relative: 7 % (ref 3.0–12.0)
Neutro Abs: 5.1 10*3/uL (ref 1.4–7.7)
Neutrophils Relative %: 64.8 % (ref 43.0–77.0)
Platelets: 194 10*3/uL (ref 150.0–400.0)
RBC: 5.3 Mil/uL — ABNORMAL HIGH (ref 3.87–5.11)
RDW: 14.2 % (ref 11.5–15.5)
WBC: 7.9 10*3/uL (ref 4.0–10.5)

## 2018-11-12 LAB — HEPATIC FUNCTION PANEL
ALT: 10 U/L (ref 0–35)
AST: 11 U/L (ref 0–37)
Albumin: 4.2 g/dL (ref 3.5–5.2)
Alkaline Phosphatase: 66 U/L (ref 39–117)
Bilirubin, Direct: 0 mg/dL (ref 0.0–0.3)
Total Bilirubin: 0.3 mg/dL (ref 0.2–1.2)
Total Protein: 7.1 g/dL (ref 6.0–8.3)

## 2018-11-12 LAB — H. PYLORI ANTIBODY, IGG: H Pylori IgG: NEGATIVE

## 2018-11-12 MED ORDER — PANTOPRAZOLE SODIUM 40 MG PO TBEC: 40.0000 mg | DELAYED_RELEASE_TABLET | Freq: Every day | ORAL | 3 refills | Status: DC

## 2018-11-12 NOTE — Addendum Note (Signed)
Addended by: Karren Cobble on: 11/12/2018 09:38 AM   Modules accepted: Orders

## 2018-11-12 NOTE — Assessment & Plan Note (Signed)
?  GERD/PUD vs other. Protonix Labs Abd [place]  RTC 6 wks

## 2018-11-12 NOTE — Progress Notes (Signed)
Subjective:  Patient ID: Marilyn [name], female    DOB: 1955/10/13  Age: 63 y.o. MRN: [id number]  CC: No chief complaint on file.   HPI Marilyn [name] presents for epig/abd pain and bloating since Feb 2020 - Pepto Bismol helped, two attacks in August. Last time it happened after eating a chicken salad, before it - ate Special K w/Lactaid.   Outpatient Medications Prior to Visit  Medication Sig Dispense Refill   budesonide-formoterol (SYMBICORT) 80-4.5 MCG/ACT inhaler Inhale 2 puffs into the lungs 2 (two) times daily. 1 Inhaler 11   busPIRone (BUSPAR) 30 MG tablet Take 0.5-1 tablets (15-30 mg total) by mouth 2 (two) times daily. 60 tablet 5   Cholecalciferol 5000 UNITS TABS Take 1 tablet by mouth 3 (three) times a week.     clobetasol cream (TEMOVATE) [id number] % Apply 1 application topically 2 (two) times daily. 60 g 3   clonazePAM (KLONOPIN) 0.5 MG tablet TAKE 1/2 TO 1 TABLET BY MOUTH TWICE A DAY AS NEEDED 60 tablet 2   Multiple Vitamin (MULTIVITAMIN WITH MINERALS) TABS tablet Take 1 tablet by mouth daily.     No facility-administered medications prior to visit.     ROS: Review of Systems  Constitutional: Negative for activity change, appetite change, chills, fatigue and unexpected weight change.  HENT: Negative for congestion, mouth sores and sinus pressure.   Eyes: Negative for visual disturbance.  Respiratory: Negative for cough and chest tightness.   Gastrointestinal: Positive for abdominal distention, abdominal pain and nausea.  Genitourinary: Negative for difficulty urinating, frequency and vaginal pain.  Musculoskeletal: Negative for back pain and gait problem.  Skin: Negative for pallor and rash.  Neurological: Negative for dizziness, tremors, weakness, numbness and headaches.  Psychiatric/Behavioral: Negative for confusion and sleep disturbance.    Objective:  BP 124/76 (BP Location: Right Arm, Patient Position: Sitting, Cuff Size: Large)    Pulse 73     Temp 98.4 F (36.9 C) (Oral)    Ht 5\' 5"  (1.651 m)    Wt 205 lb (93 kg)    SpO2 97%    BMI 34.11 kg/m   BP Readings from Last 3 Encounters:  11/12/18 124/76  09/18/18 130/86  03/18/18 126/84    Wt Readings from Last 3 Encounters:  11/12/18 205 lb (93 kg)  09/18/18 207 lb (93.9 kg)  03/18/18 205 lb (93 kg)    Physical Exam Constitutional:      General: She is not in acute distress.    Appearance: She is well-developed. She is obese.  HENT:     Head: Normocephalic.     Right Ear: External ear normal.     Left Ear: External ear normal.     Nose: Nose normal.  Eyes:     General:        Right eye: No discharge.        Left eye: No discharge.     Conjunctiva/sclera: Conjunctivae normal.     Pupils: Pupils are equal, round, and reactive to light.  Neck:     Musculoskeletal: Normal range of motion and neck supple.     Thyroid: No thyromegaly.     Vascular: No JVD.     Trachea: No tracheal deviation.  Cardiovascular:     Rate and Rhythm: Normal rate and regular rhythm.     Heart sounds: Normal heart sounds.  Pulmonary:     Effort: No respiratory distress.     Breath sounds: No stridor. No wheezing.  Abdominal:  General: Bowel sounds are normal. There is no distension.     Palpations: Abdomen is soft. There is no mass.     Tenderness: There is no abdominal tenderness. There is no guarding or rebound.  Musculoskeletal:        General: No tenderness.  Lymphadenopathy:     Cervical: No cervical adenopathy.  Skin:    Findings: No erythema or rash.  Neurological:     Cranial Nerves: No cranial nerve deficit.     Motor: No abnormal muscle tone.     Coordination: Coordination normal.     Deep Tendon Reflexes: Reflexes normal.  Psychiatric:        Behavior: Behavior normal.        Thought Content: Thought content normal.        Judgment: Judgment normal.     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 Patient presents with a palpable lump at the edge of the lumpectomy site which she states may have increased in size since her prior imaging in October, 2019. Annual evaluation, RIGHT breast. EXAM: DIGITAL DIAGNOSTIC BILATERAL MAMMOGRAM WITH CAD AND TOMO ULTRASOUND LEFT BREAST COMPARISON:  Previous exam(s). ACR Breast Density Category c: The breast tissue is heterogeneously dense, which may obscure small masses. FINDINGS: Tomosynthesis and synthesized full field CC and MLO views of both breasts were obtained. Tomosynthesis and synthesized spot compression tangential view of the area of concern in the LEFT breast was also obtained. Corresponding to the palpable concern is a densely calcified benign oil cyst at the lumpectomy site in the [place] of the LEFT breast. No new or suspicious findings elsewhere in the LEFT breast. No findings suspicious for malignancy in the RIGHT breast. Mammographic images were processed with CAD. On correlative physical exam, there is a firm palpable approximate 1 cm mass in the far OUTER LEFT breast. Targeted LEFT breast ultrasound is performed, showing the previously identified benign calcified oil cyst at the 3 o'clock position approximately cm from the nipple measuring maximally approximately 1.1 cm, unchanged from prior ultrasounds, corresponding to the palpable concern. No suspicious solid mass or abnormal  acoustic shadowing is identified IMPRESSION: 1. No mammographic or sonographic evidence of malignancy involving the LEFT breast. 2. No mammographic evidence of malignancy involving the RIGHT breast. 3. Stable benign densely calcified oil cyst at the edge of the lumpectomy site in the OUTER LEFT breast which accounts for the palpable concern. RECOMMENDATION: Screening mammogram in one year.(Code:SM-B-01Y) I have discussed the findings and recommendations with the patient. Results were also provided in writing at the conclusion of the visit. If applicable, a reminder letter will be sent to the patient regarding the next appointment. BI-RADS CATEGORY  2: Benign. Electronically Signed   By: Marcello Moores  Lawrence M.D.   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 Patient presents with a palpable lump at the edge of the lumpectomy site which she states may have increased in size since her prior imaging in October, 2019. Annual evaluation, RIGHT breast. EXAM: DIGITAL DIAGNOSTIC BILATERAL MAMMOGRAM WITH CAD AND TOMO ULTRASOUND LEFT BREAST COMPARISON:  Previous exam(s). ACR Breast Density Category c: The breast tissue is heterogeneously dense, which may obscure small masses. FINDINGS: Tomosynthesis and synthesized full field CC and MLO views of both breasts were obtained. Tomosynthesis and synthesized spot compression tangential view of the area of concern in the LEFT breast was also obtained. Corresponding to the palpable concern is a densely calcified benign oil cyst at the lumpectomy site in the [place] of the LEFT breast. No new or suspicious findings elsewhere in the LEFT breast. No findings suspicious for malignancy in the RIGHT breast. Mammographic images were processed with CAD. On correlative physical exam, there is a firm palpable approximate 1 cm  mass in the far OUTER LEFT breast. Targeted LEFT breast ultrasound is performed, showing the previously identified benign calcified oil cyst at the 3 o'clock position approximately cm from the nipple measuring maximally approximately 1.1 cm, unchanged from prior ultrasounds, corresponding to the palpable concern. No suspicious solid mass or abnormal acoustic shadowing is identified IMPRESSION: 1. No mammographic or sonographic evidence of malignancy involving the LEFT breast. 2. No mammographic evidence of malignancy involving the RIGHT breast. 3. Stable benign densely calcified oil cyst at the edge of the lumpectomy site in the OUTER LEFT breast which accounts for the palpable concern. RECOMMENDATION: Screening mammogram in one year.(Code:SM-B-01Y) I have discussed the findings and recommendations with the patient. Results were also provided in writing at the conclusion of the visit. If applicable, a reminder letter will be sent to the patient regarding the next appointment. BI-RADS CATEGORY  2: Benign. Electronically Signed   By: Evangeline Dakin M.D.   On: 08/13/2018 08:53    Assessment & Plan:   There are no diagnoses linked to this encounter.   No orders of the defined types were placed in this encounter.    Follow-up: No follow-ups on file.  Walker Kehr, MD

## 2018-11-12 NOTE — Assessment & Plan Note (Signed)
Vit D 

## 2018-11-12 NOTE — Assessment & Plan Note (Signed)
No change 

## 2018-11-12 NOTE — Addendum Note (Signed)
Addended by: Karren Cobble on: 11/12/2018 01:30 PM   Modules accepted: Orders

## 2018-11-22 ENCOUNTER — Ambulatory Visit
Admission: RE | Admit: 2018-11-22 | Discharge: 2018-11-22 | Disposition: A | Discharge status: Home / Self Care | Payer: No Typology Code available for payment source | Source: Ambulatory Visit | Attending: Internal Medicine | Admitting: Internal Medicine

## 2018-11-22 DIAGNOSIS — R1084 Generalized abdominal pain: Secondary | ICD-10-CM

## 2018-11-28 ENCOUNTER — Inpatient Hospital Stay: Payer: No Typology Code available for payment source | Attending: Adult Health | Admitting: Adult Health

## 2018-11-28 ENCOUNTER — Encounter: Payer: Self-pay | Admitting: Adult Health

## 2018-11-28 ENCOUNTER — Other Ambulatory Visit: Payer: Self-pay

## 2018-11-28 ENCOUNTER — Telehealth: Payer: Self-pay | Admitting: Adult Health

## 2018-11-28 VITALS — BP 137/84 | HR 92 | Temp 97.8°F | Resp 18 | Ht 65.0 in | Wt 203.5 lb

## 2018-11-28 DIAGNOSIS — Z888 Allergy status to other drugs, medicaments and biological substances status: Secondary | ICD-10-CM | POA: Diagnosis not present

## 2018-11-28 DIAGNOSIS — Z818 Family history of other mental and behavioral disorders: Secondary | ICD-10-CM | POA: Diagnosis not present

## 2018-11-28 DIAGNOSIS — F419 Anxiety disorder, unspecified: Secondary | ICD-10-CM | POA: Diagnosis not present

## 2018-11-28 DIAGNOSIS — F17210 Nicotine dependence, cigarettes, uncomplicated: Secondary | ICD-10-CM | POA: Diagnosis not present

## 2018-11-28 DIAGNOSIS — Z79899 Other long term (current) drug therapy: Secondary | ICD-10-CM | POA: Insufficient documentation

## 2018-11-28 DIAGNOSIS — M1990 Unspecified osteoarthritis, unspecified site: Secondary | ICD-10-CM | POA: Insufficient documentation

## 2018-11-28 DIAGNOSIS — Z801 Family history of malignant neoplasm of trachea, bronchus and lung: Secondary | ICD-10-CM | POA: Insufficient documentation

## 2018-11-28 DIAGNOSIS — Z8719 Personal history of other diseases of the digestive system: Secondary | ICD-10-CM | POA: Insufficient documentation

## 2018-11-28 DIAGNOSIS — Z923 Personal history of irradiation: Secondary | ICD-10-CM | POA: Insufficient documentation

## 2018-11-28 DIAGNOSIS — Z880 Allergy status to penicillin: Secondary | ICD-10-CM | POA: Diagnosis not present

## 2018-11-28 DIAGNOSIS — Z800 Family history of malignant neoplasm of digestive organs: Secondary | ICD-10-CM | POA: Insufficient documentation

## 2018-11-28 DIAGNOSIS — Z8042 Family history of malignant neoplasm of prostate: Secondary | ICD-10-CM | POA: Diagnosis not present

## 2018-11-28 DIAGNOSIS — Z832 Family history of diseases of the blood and blood-forming organs and certain disorders involving the immune mechanism: Secondary | ICD-10-CM | POA: Insufficient documentation

## 2018-11-28 DIAGNOSIS — Z803 Family history of malignant neoplasm of breast: Secondary | ICD-10-CM | POA: Diagnosis not present

## 2018-11-28 DIAGNOSIS — Z8052 Family history of malignant neoplasm of bladder: Secondary | ICD-10-CM | POA: Insufficient documentation

## 2018-11-28 DIAGNOSIS — C50412 Malignant neoplasm of upper-outer quadrant of left female breast: Secondary | ICD-10-CM | POA: Insufficient documentation

## 2018-11-28 DIAGNOSIS — Z8269 Family history of other diseases of the musculoskeletal system and connective tissue: Secondary | ICD-10-CM | POA: Diagnosis not present

## 2018-11-28 DIAGNOSIS — Z79811 Long term (current) use of aromatase inhibitors: Secondary | ICD-10-CM | POA: Insufficient documentation

## 2018-11-28 DIAGNOSIS — Z83438 Family history of other disorder of lipoprotein metabolism and other lipidemia: Secondary | ICD-10-CM | POA: Diagnosis not present

## 2018-11-28 DIAGNOSIS — Z170 Estrogen receptor positive status [ER+]: Secondary | ICD-10-CM | POA: Insufficient documentation

## 2018-11-28 NOTE — Telephone Encounter (Signed)
I talk with patient regarding scheduling

## 2018-11-28 NOTE — Progress Notes (Signed)
CLINIC:  Survivorship   REASON FOR VISIT:  Routine follow-up for history of breast cancer.   BRIEF ONCOLOGIC HISTORY:  Oncology History Overview Note  ATM and MUTYH VUS.  Otherwise negative genetic testing   Malignant neoplasm of upper-outer quadrant of left breast in female, estrogen receptor positive ([place])  06/20/2011 Surgery   Left breast lumpectomy with sentinel lymph node biopsy: 1 cm IDC, grade 1, ER positive, PR positive, HER-2 negative, Ki-67 6%   08/17/2011 - 09/28/2011 Radiation Therapy   Adjuvant radiation therapy   10/02/2011 - 11/23/2016 Anti-estrogen oral therapy   Arimidex 1 mg daily started 10/02/2011 stopped March 2015 for pain and restarted August 2015   12/21/2017 Genetic Testing   ATM c.3191T>C and MUTYH c.920G>A VUS identified on the common hereditary cancer panel.  The Hereditary Gene Panel offered by [facility] includes sequencing and/or deletion duplication testing of the following 47 genes: APC, ATM, AXIN2, BARD1, BMPR1A, BRCA1, BRCA2, BRIP1, CDH1, CDK4, CDKN2A (p14ARF), CDKN2A (p16INK4a), CHEK2, CTNNA1, DICER1, EPCAM (Deletion/duplication testing only), GREM1 (promoter region deletion/duplication testing only), KIT, MEN1, MLH1, MSH2, MSH3, MSH6, MUTYH, NBN, NF1, NHTL1, PALB2, PDGFRA, PMS2, POLD1, POLE, PTEN, RAD50, RAD51C, RAD51D, SDHB, SDHC, SDHD, SMAD4, SMARCA4. STK11, TP53, TSC1, TSC2, and VHL.  The following genes were evaluated for sequence changes only: SDHA and HOXB13 c.251G>A variant only. The report date is 12/21/2017.      INTERVAL HISTORY:  Marilyn [name] presents to the Survivorship [facility] today for routine follow-up for her history of breast cancer.  Overall, she reports feeling quite well. She has underwent a couple of mammograms in the past year, one concerning for calcifications.  Her most recent mammogram in April, 2019.    She is a current every day smoker.  She has smoked 1/2 ppd for 40 years.  She is considering quitting.  She is up to date with colon  cancer screening.  She has never had a skin cancer screen.  She still undergoes regular GYN visits.    Her last bone density was in 06/2016 and consistent with osteopenia in the left femoral neck with a T score of -1.5.  REVIEW OF SYSTEMS:  Review of Systems  Constitutional: Negative for appetite change, chills, fatigue, fever and unexpected weight change.  HENT:   Negative for hearing loss, lump/mass, sore throat, tinnitus and trouble swallowing.   Eyes: Negative for eye problems and icterus.  Respiratory: Negative for chest tightness, cough, shortness of breath and wheezing.   Cardiovascular: Negative for chest pain, leg swelling and palpitations.  Gastrointestinal: Negative for abdominal distention, abdominal pain, constipation, diarrhea, nausea and vomiting.  Endocrine: Negative for hot flashes.  Skin: Negative for itching and rash.  Neurological: Negative for dizziness, extremity weakness, headaches and numbness.  Hematological: Negative for adenopathy. Does not bruise/bleed easily.  Psychiatric/Behavioral: Negative for depression. The patient is not nervous/anxious.   Breast: Denies any new nodularity, masses, tenderness, nipple changes, or nipple discharge.       PAST MEDICAL/SURGICAL HISTORY:  Past Medical History:  Diagnosis Date  . Allergy   . Anemia    hx  . Anxiety    panic disorder  . Breast cancer ([place]) 06/06/2011   bc  left breast 3 o'clock dx=invasive ductal ca uoqER/PR=positive  . Cancer ([place])    BREAST - left  . Cigarette smoker   . Colon polyp   . Complication of anesthesia    DIFFICULTY AWAKENING  . Depression   . Diverticulosis of colon   . DJD (degenerative joint disease)   .  DJD (degenerative joint disease)   . Family history of breast cancer   . Family history of colon cancer   . Family history of pancreatic cancer   . Family history of prostate cancer   . GERD (gastroesophageal reflux disease)    Pt. denies having GERD. Unable to remove it.  Marland Kitchen  Headache(784.0)   . History of anemia   . History of bronchitis   . History of sarcoidosis   . Hyperlipidemia    no meds needed  . Incisional breast wound    AT AGE 30 BILATERAL INCISION TO BREAST MADE  . Leg swelling   . Panic disorder   . Personal history of radiation therapy   . S/P radiation therapy 08/17/11 - 09/28/11   LLQ - 50 Gy/25 Fractions with Boost of 10 Gy / 5 fractions  . Sialoadenitis of submandibular gland 2016  . Vitamin D deficiency    Past Surgical History:  Procedure Laterality Date  . BIOPSY BREAST     left breast  as a teenager  benign  . BREAST BIOPSY Left 06/06/2011  . BREAST EXCISIONAL BIOPSY Bilateral   . BREAST LUMPECTOMY Left 2013  . BREAST SURGERY  07/05/11   left breast lumpectomy with needle loc & axillary sln bx  . BUNIONECTOMY  2011   bilateral by Dr Little Ishikawa  . COLONOSCOPY     polyp  . cyst removed from right hand  1995  . TONSILLECTOMY  1972  . TOTAL ABDOMINAL HYSTERECTOMY  2000   Dr Nori Riis  . TRIGGER FINGER RELEASE  2008   Dr Burney Gauze     ALLERGIES:  Allergies  Allergen Reactions  . Lexapro [Escitalopram Oxalate]     Felt like zombie  . Penicillins Rash and Other (See Comments)    At injection site.     CURRENT MEDICATIONS:  Outpatient Encounter Medications as of 11/28/2018  Medication Sig  . budesonide-formoterol (SYMBICORT) 80-4.5 MCG/ACT inhaler Inhale 2 puffs into the lungs 2 (two) times daily.  . busPIRone (BUSPAR) 30 MG tablet Take 0.5-1 tablets (15-30 mg total) by mouth 2 (two) times daily.  . Cholecalciferol 5000 UNITS TABS Take 1 tablet by mouth 3 (three) times a week.  . clobetasol cream (TEMOVATE) 6.22 % Apply 1 application topically 2 (two) times daily.  . clonazePAM (KLONOPIN) 0.5 MG tablet TAKE 1/2 TO 1 TABLET BY MOUTH TWICE A DAY AS NEEDED  . Multiple Vitamin (MULTIVITAMIN WITH MINERALS) TABS tablet Take 1 tablet by mouth daily.  . pantoprazole (PROTONIX) 40 MG tablet Take 1 tablet (40 mg total) by mouth daily.   No  facility-administered encounter medications on file as of 11/28/2018.      ONCOLOGIC FAMILY HISTORY:  Family History  Problem Relation Age of Onset  . Anemia Mother   . Other Mother        + H Pylori  . Seizures Mother   . Prostate cancer Father        dx over 46  . Colon cancer Father 24       diagnosed 2009  . Bladder Cancer Father        dx over 62  . Breast cancer Sister        dx in her 78s; mat half sister  . Pancreatic cancer Sister 39       d. 76  . Lung cancer Sister   . Sarcoidosis Sister        mat 1/2 sister  . Multiple sclerosis Sister        #  2  . Hypertension Brother        #1  . Hyperlipidemia Brother   . Prostate cancer Brother        dx under 81  . Breast cancer Cousin        pat first cousin d. 64  . Dementia Maternal Aunt   . Seizures Maternal Uncle   . Lung cancer Paternal Aunt   . Brain cancer Other        benign  . Dementia Maternal Aunt   . Lung cancer Maternal Uncle   . Sarcoidosis Cousin        mat first cousin  . Breast cancer Cousin        maternal 2nd cousins - distant  . Esophageal cancer Neg Hx   . Stomach cancer Neg Hx   . Rectal cancer Neg Hx     GENETIC COUNSELING/TESTING: BRCA and BART tested in 2011-05-09 and negative  SOCIAL HISTORY:  Social History   Socioeconomic History  . Marital status: Married    Spouse name: Not on file  . Number of children: 2  . Years of education: Not on file  . Highest education level: Not on file  Occupational History  . Occupation: Tour manager  Social Needs  . Financial resource strain: Not on file  . Food insecurity    Worry: Not on file    Inability: Not on file  . Transportation needs    Medical: Not on file    Non-medical: Not on file  Tobacco Use  . Smoking status: Current Every Day Smoker    Packs/day: 0.50    Types: Cigarettes    Last attempt to quit: 04/16/2012    Years since quitting: 6.6  . Smokeless tobacco: Never Used  Substance and Sexual Activity  . Alcohol use:  Yes    Alcohol/week: 0.0 - 1.0 standard drinks    Comment: social  . Drug use: No  . Sexual activity: Not Currently    Birth control/protection: Surgical    Comment: menses age 56,hrt started  1998/05/08  Lifestyle  . Physical activity    Days per week: Not on file    Minutes per session: Not on file  . Stress: Not on file  Relationships  . Social Herbalist on phone: Not on file    Gets together: Not on file    Attends religious service: Not on file    Active member of club or organization: Not on file    Attends meetings of clubs or organizations: Not on file    Relationship status: Not on file  . Intimate partner violence    Fear of current or ex partner: Not on file    Emotionally abused: Not on file    Physically abused: Not on file    Forced sexual activity: Not on file  Other Topics Concern  . Not on file  Social History Narrative   Widowed - husband died May 09, 2003   2 step-children   Exercises 3x per week   2 cups of caffeine daily            PHYSICAL EXAMINATION:  Vital Signs: Vitals:   11/28/18 1500  BP: 137/84  Pulse: 92  Resp: 18  Temp: 97.8 F (36.6 C)  SpO2: 99%   Filed Weights   11/28/18 1500  Weight: 203 lb 8 oz (92.3 kg)   General: Well-nourished, well-appearing female in no acute distress.  Unaccompanied today.   HEENT: Head is  normocephalic.  Pupils equal and reactive to light. Conjunctivae clear without exudate.  Sclerae anicteric. Oral mucosa is pink, moist.  Oropharynx is pink without lesions or erythema.  Lymph: No cervical, supraclavicular, or infraclavicular lymphadenopathy noted on palpation.  Cardiovascular: Regular rate and rhythm.Marland Kitchen Respiratory: Clear to auscultation bilaterally. Chest expansion symmetric; breathing non-labored.  Breast Exam:  -Left breast:. No skin redness, thickening, or peau d'orange appearance; no nipple retraction or nipple discharge; small about 0.5cm nodule noted at 3 oclock on left breast about 8-9cmfn  -Right breast: No appreciable masses on palpation. No skin redness, thickening, or peau d'orange appearance; no nipple retraction or nipple discharge; -Axilla: No axillary adenopathy bilaterally.  GI: Abdomen soft and round; non-tender, non-distended. Bowel sounds normoactive. No hepatosplenomegaly.   GU: Deferred.  Neuro: No focal deficits. Steady gait.  Psych: Mood and affect normal and appropriate for situation.  MSK: No focal spinal tenderness to palpation, full range of motion in bilateral upper extremities Extremities: No edema. Skin: Warm and dry.  LABORATORY DATA:  None for this visit   DIAGNOSTIC IMAGING:  Most recent mammogram:  CLINICAL DATA:  Malignant lumpectomy of the UPPER OUTER QUADRANT of the LEFT breast in 2013 with adjuvant radiation therapy. Patient presents with a palpable lump at the edge of the lumpectomy site which she states may have increased in size since her prior imaging in October, 2019. Annual evaluation, RIGHT breast.  EXAM: DIGITAL DIAGNOSTIC BILATERAL MAMMOGRAM WITH CAD AND TOMO  ULTRASOUND LEFT BREAST  COMPARISON:  Previous exam(s).  ACR Breast Density Category c: The breast tissue is heterogeneously dense, which may obscure small masses.  FINDINGS: Tomosynthesis and synthesized full field CC and MLO views of both breasts were obtained. Tomosynthesis and synthesized spot compression tangential view of the area of concern in the LEFT breast was also obtained.  Corresponding to the palpable concern is a densely calcified benign oil cyst at the lumpectomy site in the [place] of the LEFT breast. No new or suspicious findings elsewhere in the LEFT breast.  No findings suspicious for malignancy in the RIGHT breast.  Mammographic images were processed with CAD.  On correlative physical exam, there is a firm palpable approximate 1 cm mass in the far OUTER LEFT breast.  Targeted LEFT breast ultrasound is performed,  showing the previously identified benign calcified oil cyst at the 3 o'clock position approximately cm from the nipple measuring maximally approximately 1.1 cm, unchanged from prior ultrasounds, corresponding to the palpable concern. No suspicious solid mass or abnormal acoustic shadowing is identified  IMPRESSION: 1. No mammographic or sonographic evidence of malignancy involving the LEFT breast. 2. No mammographic evidence of malignancy involving the RIGHT breast. 3. Stable benign densely calcified oil cyst at the edge of the lumpectomy site in the OUTER LEFT breast which accounts for the palpable concern.  RECOMMENDATION: Screening mammogram in one year.(Code:SM-B-01Y)  I have discussed the findings and recommendations with the patient. Results were also provided in writing at the conclusion of the visit. If applicable, a reminder letter will be sent to the patient regarding the next appointment.  BI-RADS CATEGORY  2: Benign.   Electronically Signed   By: Evangeline Dakin M.D.   On: 08/13/2018 08:53   ASSESSMENT AND PLAN:  Marilyn [name] is a pleasant 63 y.o. female with history of Stage IA left breast invasive ductal carcinoma, ER+/PR+/HER2-, diagnosed in 2013, treated with lumpectomy, adjuvant radiation therapy, and anti-estrogen therapy with Anastrozole x 5 years completing in 10/2016.  She presents to  the [facility]orship [facility] for surveillance and routine follow-up.   1. History of breast cancer:  Marilyn [name] is currently clinically and radiographically without evidence of disease or recurrence of breast cancer. She will be due for mammogram in 07/2019.  She will return in one year for LTS follow up.  I encouraged her to call me with any questions or concerns before her next visit at the cancer center, and I would be happy to see her sooner, if needed.    2. Bone health:  Given Marilyn [name]'s age, history of breast cancer, and her previous anti-estrogen therapy with  Anastrozole, she is at risk for bone demineralization. Her last DEXA scan was on 07/11/2016 and demonstrated a t score of -1.5 in the left femoral neck consistent with osteopenia.    She was given education on specific food and activities to promote bone health.  3. Cancer screening:  Due to Marilyn [name]'s history and her age, she should receive screening for skin cancers, colon cancer, and gynecologic cancers. She was encouraged to follow-up with her PCP for appropriate cancer screenings.   4. Health maintenance and wellness promotion: Marilyn [name] was encouraged to consume 5-7 servings of fruits and vegetables per day. She was also encouraged to engage in moderate to vigorous exercise for 30 minutes per day most days of the week. She was instructed to limit her alcohol consumption and was encouraged stop smoking.  We reviewed ideas for smoking cessation, and ways to quit smoking.    Dispo:  -Return to cancer center in one year for LTS follow up -Bone density due -Mammogram in 05/2019   A total of (30) minutes of face-to-face time was spent with this patient with greater than 50% of that time in counseling and care-coordination.   Gardenia Phlegm, NP Survivorship Program [facility] [phone]   Note: PRIMARY CARE PROVIDER Cassandria Anger, [place] [phone]

## 2018-12-05 ENCOUNTER — Encounter: Payer: Self-pay | Admitting: Physician Assistant

## 2018-12-05 ENCOUNTER — Ambulatory Visit (INDEPENDENT_AMBULATORY_CARE_PROVIDER_SITE_OTHER): Payer: No Typology Code available for payment source | Admitting: Physician Assistant

## 2018-12-05 VITALS — BP 138/92 | HR 80 | Temp 98.3°F | Ht 62.25 in | Wt 202.2 lb

## 2018-12-05 DIAGNOSIS — R1013 Epigastric pain: Secondary | ICD-10-CM

## 2018-12-05 DIAGNOSIS — K8020 Calculus of gallbladder without cholecystitis without obstruction: Secondary | ICD-10-CM

## 2018-12-05 DIAGNOSIS — K5900 Constipation, unspecified: Secondary | ICD-10-CM | POA: Diagnosis not present

## 2018-12-05 NOTE — Progress Notes (Signed)
Chief Complaint: Abdominal pain  HPI:    Marilyn [name] is a 63 year old African-American female with a past medical history as listed below, known to Dr. Silverio Decamp for her family history of colon cancer, who was referred to me by Plotnikov, Evie Lacks, MD for a complaint of abdominal pain.      02/01/2015 colonoscopy with a 5-9 mm sessile polyp.  Pathology showed tubular adenoma.  Repeat recommended in 5 years.    11/12/2018 CBC, BMP and hepatic function panel normal.  H. pylori antibody IgG negative.    11/22/2018 patient had abdominal ultrasound for intermittent epigastric pain and bloating since February.  No acute abnormality.  Cholelithiasis.    Today, the patient presents clinic and explains that she has had 3 acute episodes of epigastric pain.  The first was in February.  Explains that she was eating and all of a sudden had a tightness up under her breastbone that made it hard for her to breathe and pain rated as a 9-10/10.  This happened pretty abruptly.  Her husband went and bought Pepto-Bismol and Tums which helped it to go away.  She had another episode at the beginning of August and another at the end of August.  She was then started on Pantoprazole 40 mg daily and has not had an episode since.  Patient is nervous to eat some things because she feels as though it may make her problems worse.  Specifically special K with red berries.  She has been picking out the red berries and has done better.    Also describes constipation today which is new for her over the past couple of months.  Tells me she has not been drinking enough water and if she does focus on drinking water and eating her fiber she does better.  Still with an incomplete evacuation feeling.  This is on an almost daily basis.    Denies fever, chills, weight loss, nausea, vomiting, blood in her stool or symptoms that awaken her from sleep.     Past Medical History:  Diagnosis Date  . Allergy   . Anemia    hx  . Anxiety    panic  disorder  . 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 Cancer ([facility])    BREAST - left  . Cigarette smoker   . Colon polyp   . Complication of anesthesia    DIFFICULTY AWAKENING  . Depression   . Diverticulosis of colon   . DJD (degenerative joint disease)   . DJD (degenerative joint disease)   . Family history of breast cancer   . Family history of colon cancer   . Family history of pancreatic cancer   . Family history of prostate cancer   . Gallstones   . GERD (gastroesophageal reflux disease)    Pt. denies having GERD. Unable to remove it.  Marland Kitchen Headache(784.0)   . History of anemia   . History of bronchitis   . History of sarcoidosis   . Hyperlipidemia    no meds needed  . Incisional breast wound    AT AGE 87 BILATERAL INCISION TO BREAST MADE  . Leg swelling   . Panic disorder   . Personal history of radiation therapy   . S/P radiation therapy 08/17/11 - 09/28/11   LLQ - 50 Gy/25 Fractions with Boost of 10 Gy / 5 fractions  . Sialoadenitis of submandibular gland 2016  . Vitamin D deficiency  Past Surgical History:  Procedure Laterality Date  . BIOPSY BREAST     left breast  as a teenager  benign  . BREAST BIOPSY Left 06/06/2011  . BREAST EXCISIONAL BIOPSY Bilateral   . BREAST LUMPECTOMY Left 2013  . BREAST SURGERY  07/05/11   left breast lumpectomy with needle loc & axillary sln bx  . BUNIONECTOMY  2011   bilateral by Dr Little Ishikawa  . COLONOSCOPY     polyp  . cyst removed from right hand  1995  . TONSILLECTOMY  1972  . TOTAL ABDOMINAL HYSTERECTOMY  2000   Dr Nori Riis  . TRIGGER FINGER RELEASE  2008   Dr Burney Gauze    Current Outpatient Medications  Medication Sig Dispense Refill  . budesonide-formoterol (SYMBICORT) 80-4.5 MCG/ACT inhaler Inhale 2 puffs into the lungs 2 (two) times daily. 1 Inhaler 11  . busPIRone (BUSPAR) 30 MG tablet Take 0.5-1 tablets (15-30 mg total) by mouth 2 (two) times daily. 60 tablet 5   . Cholecalciferol 5000 UNITS TABS Take 1 tablet by mouth 3 (three) times a week.    . clobetasol cream (TEMOVATE) [id number] % Apply 1 application topically 2 (two) times daily. (Patient taking differently: Apply 1 application topically 2 (two) times daily as needed. ) 60 g 3  . clonazePAM (KLONOPIN) 0.5 MG tablet TAKE 1/2 TO 1 TABLET BY MOUTH TWICE A DAY AS NEEDED 60 tablet 2  . Multiple Vitamin (MULTIVITAMIN WITH MINERALS) TABS tablet Take 1 tablet by mouth daily.    . pantoprazole (PROTONIX) 40 MG tablet Take 1 tablet (40 mg total) by mouth daily. 90 tablet 3   No current facility-administered medications for this visit.     Allergies as of 12/05/2018 - Review Complete 12/05/2018  Allergen Reaction Noted  . Lexapro [escitalopram oxalate]  08/02/2017  . Penicillins Rash and Other (See Comments)     Family History  Problem Relation Age of Onset  . Anemia Mother   . Other Mother        + H Pylori  . Seizures Mother   . Prostate cancer Father        dx over 87  . Colon cancer Father 28       diagnosed 2009  . Bladder Cancer Father        dx over 65  . Breast cancer Sister        dx in her 3s; mat half sister  . Pancreatic cancer Sister 62       d. 7  . Lung cancer Sister   . Sarcoidosis Sister        mat 1/2 sister  . Multiple sclerosis Sister        #2  . Hypertension Brother        #1  . Hyperlipidemia Brother   . Prostate cancer Brother        dx under 43  . Breast cancer Cousin        pat first cousin d. 62  . Dementia Maternal Aunt   . Seizures Maternal Uncle   . Lung cancer Paternal Aunt   . Brain cancer Other        benign  . Dementia Maternal Aunt   . Lung cancer Maternal Uncle   . Sarcoidosis Cousin        mat first cousin  . Breast cancer Cousin        maternal 2nd cousins - distant  . Esophageal cancer Neg Hx   . Stomach cancer Neg  Hx   . Rectal cancer Neg Hx     Social History   Socioeconomic History  . Marital status: Married    Spouse name: Not  on file  . Number of children: 2  . Years of education: Not on file  . Highest education level: Not on file  Occupational History  . Occupation: retired  Scientific laboratory technician  . Financial resource strain: Not on file  . Food insecurity    Worry: Not on file    Inability: Not on file  . Transportation needs    Medical: Not on file    Non-medical: Not on file  Tobacco Use  . Smoking status: Current Every Day Smoker    Packs/day: 0.50    Types: Cigarettes    Last attempt to quit: 04/16/2012    Years since quitting: 6.6  . Smokeless tobacco: Never Used  Substance and Sexual Activity  . Alcohol use: Yes    Alcohol/week: 0.0 - 1.0 standard drinks    Comment: social  . Drug use: No  . Sexual activity: Not Currently    Birth control/protection: Surgical    Comment: menses age 72,hrt started  06-13-98  Lifestyle  . Physical activity    Days per week: Not on file    Minutes per session: Not on file  . Stress: Not on file  Relationships  . Social Herbalist on phone: Not on file    Gets together: Not on file    Attends religious service: Not on file    Active member of club or organization: Not on file    Attends meetings of clubs or organizations: Not on file    Relationship status: Not on file  . Intimate partner violence    Fear of current or ex partner: Not on file    Emotionally abused: Not on file    Physically abused: Not on file    Forced sexual activity: Not on file  Other Topics Concern  . Not on file  Social History Narrative   Widowed - husband died 06/13/2003   2 step-children   Exercises 3x per week   2 cups of caffeine daily          Review of Systems:    Constitutional: No weight loss, fever or chills Skin: No rash  Cardiovascular: No chest pain Respiratory: No SOB  Gastrointestinal: See HPI and otherwise negative Genitourinary: No dysuria  Neurological: No headache, dizziness or syncope Musculoskeletal: No new muscle or joint pain Hematologic: No  bleeding  Psychiatric: No history of depression or anxiety   Physical Exam:  Vital signs: BP (!) 138/92   Pulse 80   Temp 98.3 F (36.8 C)   Ht 5' 2.25" (1.581 m)   Wt 202 lb 4 oz (91.7 kg)   BMI 36.70 kg/m   Constitutional:   Pleasant AA female appears to be in NAD, Well developed, Well nourished, alert and cooperative Head:  Normocephalic and atraumatic. Eyes:   PEERL, EOMI. No icterus. Conjunctiva pink. Ears:  Normal auditory acuity. Neck:  Supple Throat: Oral cavity and pharynx without inflammation, swelling or lesion.  Respiratory: Respirations even and unlabored. Lungs clear to auscultation bilaterally.   No wheezes, crackles, or rhonchi.  Cardiovascular: Normal S1, S2. No MRG. Regular rate and rhythm. No peripheral edema, cyanosis or pallor.  Gastrointestinal:  Soft, nondistended, mild epigastric ttp. No rebound or guarding. Normal bowel sounds. No appreciable masses or hepatomegaly. Rectal:  Not performed.  Msk:  Symmetrical  without gross deformities. Without edema, no deformity or joint abnormality.  Neurologic:  Alert and  oriented x4;  grossly normal neurologically.  Skin:   Dry and intact without significant lesions or rashes. Psychiatric: Demonstrates good judgement and reason without abnormal affect or behaviors.  MOST RECENT LABS AND IMAGING: CBC    Component Value Date/Time   WBC 7.9 11/12/2018 0912   RBC 5.30 (H) 11/12/2018 0912   HGB 12.9 11/12/2018 0912   HGB 12.2 11/04/2013 0928   HCT 39.8 11/12/2018 0912   HCT 38.9 11/04/2013 0928   PLT 194.0 11/12/2018 0912   PLT 247 11/04/2013 0928   MCV 75.2 (L) 11/12/2018 0912   MCV 74.7 (L) 11/04/2013 0928   MCH 23.8 (L) 07/14/2016 2154   MCHC 32.3 11/12/2018 0912   RDW 14.2 11/12/2018 0912   RDW 14.7 (H) 11/04/2013 0928   LYMPHSABS 1.9 11/12/2018 0912   LYMPHSABS 1.8 11/04/2013 0928   MONOABS 0.6 11/12/2018 0912   MONOABS 0.5 11/04/2013 0928   EOSABS 0.3 11/12/2018 0912   EOSABS 0.2 11/04/2013 0928    BASOSABS 0.0 11/12/2018 0912   BASOSABS 0.0 11/04/2013 0928    CMP     Component Value Date/Time   NA 139 11/12/2018 0912   NA 138 11/04/2013 0929   K 4.0 11/12/2018 0912   K 3.8 11/04/2013 0929   CL 106 11/12/2018 0912   CL 107 03/22/2012 0853   CO2 27 11/12/2018 0912   CO2 23 11/04/2013 0929   GLUCOSE 96 11/12/2018 0912   GLUCOSE 100 11/04/2013 0929   GLUCOSE 99 03/22/2012 0853   BUN 16 11/12/2018 0912   BUN 12.1 11/04/2013 0929   CREATININE 0.80 11/12/2018 0912   CREATININE 0.8 11/04/2013 0929   CALCIUM 9.9 11/12/2018 0912   CALCIUM 9.6 11/04/2013 0929   PROT 7.1 11/12/2018 0912   PROT 7.2 11/04/2013 0929   ALBUMIN 4.2 11/12/2018 0912   ALBUMIN 3.6 11/04/2013 0929   AST 11 11/12/2018 0912   AST 12 11/04/2013 0929   ALT 10 11/12/2018 0912   ALT 13 11/04/2013 0929   ALKPHOS 66 11/12/2018 0912   ALKPHOS 108 11/04/2013 0929   BILITOT 0.3 11/12/2018 0912   BILITOT 0.28 11/04/2013 0929   GFRNONAA >60 07/14/2016 2305   GFRAA >60 07/14/2016 2305    Assessment: 1.  Epigastric pain: 3 acute episodes while eating, no further since being on Pantoprazole 40 mg daily, ultrasound with cholelithiasis; discussed with patient his symptoms sound more related to gastritis to me, now better on Pantoprazole 2.  Constipation: Over the past 3 to 4 weeks, "incomplete evacuation" 3.  Cholelithiasis: Seen on recent abdominal ultrasound, could be related to recent symptoms, though these could just be from gastritis  Plan: 1.  Patient requests referral to [facility] for further discussion about possible cholecystectomy.  Explained to her that if she is not having any symptoms from her cholelithiasis it is not necessary for her to have her gallbladder out at this time.  She can discuss further with them. 2.  Continue Pantoprazole 40 mg daily, 30-60 minutes before breakfast.  This is provided by her PCP. 3.  Reviewed antireflux diet and lifestyle modifications. 4.  Would recommend patient increase  fiber in her diet to least 25-35 g/day with use of fiber supplement or through her diet. 5.  Increase water intake to the 6-8 eight ounce glasses of water per day. 6.  Recommend the patient start MiraLAX once daily 7.  Patient to follow in clinic with  Dr. Silverio Decamp or myself as needed in the future.  Ellouise Newer, PA-C [facility] 12/05/2018, 10:36 AM  Cc: Cassandria Anger, MD

## 2018-12-05 NOTE — Patient Instructions (Signed)
Continue Pantoprazole 40mg  daily.   A high fiber diet with plenty of fluids (up to 8 glasses of water daily) is suggested to relieve these symptoms.  Metamucil, 1 tablespoon once or twice daily can be used to keep bowels regular if needed.  Start Miralax - dissolve 17 grams in at least 8 ounces of water or juice daily.   Referral will be sent to [facility]- their office will contact you for appointment.    If you are age 64 or younger, your body mass index should be between 19-25. Your Body mass index is 36.7 kg/m. If this is out of the aformentioned range listed, please consider follow up with your Primary Care Provider.    Thank you for choosing me and [facility].  Dennison Bulla

## 2018-12-06 NOTE — Progress Notes (Signed)
Can consider EGD to exclude gastritis or peptic ulcer disease if has persistent intermittent symptoms.   Reviewed and agree with documentation and assessment and plan. Damaris Hippo , MD

## 2018-12-19 ENCOUNTER — Ambulatory Visit: Payer: No Typology Code available for payment source | Admitting: Internal Medicine

## 2018-12-26 ENCOUNTER — Ambulatory Visit (INDEPENDENT_AMBULATORY_CARE_PROVIDER_SITE_OTHER): Payer: No Typology Code available for payment source | Admitting: Internal Medicine

## 2018-12-26 ENCOUNTER — Other Ambulatory Visit: Payer: Self-pay

## 2018-12-26 ENCOUNTER — Encounter: Payer: Self-pay | Admitting: Internal Medicine

## 2018-12-26 DIAGNOSIS — F419 Anxiety disorder, unspecified: Secondary | ICD-10-CM | POA: Diagnosis not present

## 2018-12-26 DIAGNOSIS — R1084 Generalized abdominal pain: Secondary | ICD-10-CM | POA: Diagnosis not present

## 2018-12-26 DIAGNOSIS — K8020 Calculus of gallbladder without cholecystitis without obstruction: Secondary | ICD-10-CM

## 2018-12-26 NOTE — Assessment & Plan Note (Signed)
Cont w/meds 

## 2018-12-26 NOTE — Assessment & Plan Note (Signed)
Gen surg ref was made on 10.8.20

## 2018-12-26 NOTE — Progress Notes (Signed)
Subjective:  Patient ID: Marilyn [name], female    DOB: February 21, 1956  Age: 63 y.o. MRN: [id number]  CC: No chief complaint on file.   HPI Marilyn [name] presents for abd pain, GS's, GERD sx's   Outpatient Medications Prior to Visit  Medication Sig Dispense Refill  . budesonide-formoterol (SYMBICORT) 80-4.5 MCG/ACT inhaler Inhale 2 puffs into the lungs 2 (two) times daily. 1 Inhaler 11  . busPIRone (BUSPAR) 30 MG tablet Take 0.5-1 tablets (15-30 mg total) by mouth 2 (two) times daily. 60 tablet 5  . Cholecalciferol 5000 UNITS TABS Take 1 tablet by mouth 3 (three) times a week.    . clobetasol cream (TEMOVATE) [id number] % Apply 1 application topically 2 (two) times daily. (Patient taking differently: Apply 1 application topically 2 (two) times daily as needed. ) 60 g 3  . clonazePAM (KLONOPIN) 0.5 MG tablet TAKE 1/2 TO 1 TABLET BY MOUTH TWICE A DAY AS NEEDED 60 tablet 2  . Multiple Vitamin (MULTIVITAMIN WITH MINERALS) TABS tablet Take 1 tablet by mouth daily.    . pantoprazole (PROTONIX) 40 MG tablet Take 1 tablet (40 mg total) by mouth daily. 90 tablet 3   No facility-administered medications prior to visit.     ROS: Review of Systems  Constitutional: Negative for activity change, appetite change, chills, fatigue and unexpected weight change.  HENT: Negative for congestion, mouth sores and sinus pressure.   Eyes: Negative for visual disturbance.  Respiratory: Negative for cough and chest tightness.   Gastrointestinal: Positive for abdominal pain. Negative for nausea and vomiting.  Genitourinary: Negative for difficulty urinating, frequency and vaginal pain.  Musculoskeletal: Negative for back pain and gait problem.  Skin: Negative for pallor and rash.  Neurological: Negative for dizziness, tremors, weakness, numbness and headaches.  Psychiatric/Behavioral: Negative for confusion, sleep disturbance and suicidal ideas.    Objective:  BP 126/82 (BP Location: Right Arm,  Patient Position: Sitting, Cuff Size: Large)   Pulse 82   Temp 98.6 F (37 C) (Oral)   Ht 5' 2.25" (1.581 m)   Wt 201 lb (91.2 kg)   SpO2 96%   BMI 36.47 kg/m   BP Readings from Last 3 Encounters:  12/26/18 126/82  12/05/18 (!) 138/92  11/28/18 137/84    Wt Readings from Last 3 Encounters:  12/26/18 201 lb (91.2 kg)  12/05/18 202 lb 4 oz (91.7 kg)  11/28/18 203 lb 8 oz (92.3 kg)    Physical Exam Constitutional:      General: She is not in acute distress.    Appearance: She is well-developed.  HENT:     Head: Normocephalic.     Right Ear: External ear normal.     Left Ear: External ear normal.     Nose: Nose normal.  Eyes:     General:        Right eye: No discharge.        Left eye: No discharge.     Conjunctiva/sclera: Conjunctivae normal.     Pupils: Pupils are equal, round, and reactive to light.  Neck:     Musculoskeletal: Normal range of motion and neck supple.     Thyroid: No thyromegaly.     Vascular: No JVD.     Trachea: No tracheal deviation.  Cardiovascular:     Rate and Rhythm: Normal rate and regular rhythm.     Heart sounds: Normal heart sounds.  Pulmonary:     Effort: No respiratory distress.     Breath sounds: No  stridor. No wheezing.  Abdominal:     General: Bowel sounds are normal. There is no distension.     Palpations: Abdomen is soft. There is no mass.     Tenderness: There is no abdominal tenderness. There is no guarding or rebound.  Musculoskeletal:        General: No tenderness.  Lymphadenopathy:     Cervical: No cervical adenopathy.  Skin:    Findings: No erythema or rash.  Neurological:     Cranial Nerves: No cranial nerve deficit.     Motor: No abnormal muscle tone.     Coordination: Coordination normal.     Deep Tendon Reflexes: Reflexes normal.  Psychiatric:        Behavior: Behavior normal.        Thought Content: Thought content normal.        Judgment: Judgment normal.   RUQ sensitive  Lab Results  Component Value  Date   WBC 7.9 11/12/2018   HGB 12.9 11/12/2018   HCT 39.8 11/12/2018   PLT 194.0 11/12/2018   GLUCOSE 96 11/12/2018   CHOL 226 (H) 09/18/2018   TRIG 103.0 09/18/2018   HDL 72.90 09/18/2018   LDLDIRECT 118.9 12/18/2011   LDLCALC 132 (H) 09/18/2018   ALT 10 11/12/2018   AST 11 11/12/2018   NA 139 11/12/2018   K 4.0 11/12/2018   CL 106 11/12/2018   CREATININE 0.80 11/12/2018   BUN 16 11/12/2018   CO2 27 11/12/2018   TSH 1.33 09/18/2018    US Abdomen Complete  Result Date: 11/22/2018 CLINICAL DATA:  Intermittent epigastric pain and bloating since February. EXAM: ABDOMEN ULTRASOUND COMPLETE COMPARISON:  CT abdomen pelvis dated Jul 15, 2016. FINDINGS: Gallbladder: Multiple gallstones. No wall thickening visualized. No sonographic Murphy sign noted by sonographer. Common bile duct: Diameter: 2 mm, normal. Liver: No focal lesion identified. Within normal limits in parenchymal echogenicity. Portal vein is patent on color Doppler imaging with normal direction of blood flow towards the liver. IVC: No abnormality visualized. Pancreas: Visualized portion unremarkable. Spleen: Not well evaluated due to overlying bowel gas. Right Kidney: Length: 11.7 cm. Echogenicity within normal limits. No mass or hydronephrosis visualized. Left Kidney: Length: 12.0 cm. Echogenicity within normal limits. No mass or hydronephrosis visualized. There are two simple appearing cysts measuring up to 3.0 cm. These have mildly increased in size since prior CT from May 2018. Abdominal aorta: No aneurysm visualized. Other findings: None. IMPRESSION: 1. No acute abnormality. 2. Cholelithiasis. Electronically Signed   By: Titus Dubin M.D.   On: 11/22/2018 14:41    Assessment & Plan:   There are no diagnoses linked to this encounter.   No orders of the defined types were placed in this encounter.    Follow-up: No follow-ups on file.  Walker Kehr, MD

## 2018-12-26 NOTE — Assessment & Plan Note (Signed)
Better on PPI

## 2018-12-26 NOTE — Patient Instructions (Addendum)
[facility] Address: [address], [place], [place] [address] Appointments: [email or web address] Phone: [phone]    Cholelithiasis  Cholelithiasis is a form of gallbladder disease in which gallstones form in the gallbladder. The gallbladder is an organ that stores bile. Bile is made in the liver, and it helps to digest fats. Gallstones begin as small crystals and slowly grow into stones. They may cause no symptoms until the gallbladder tightens (contracts) and a gallstone is blocking the duct (gallbladder attack), which can cause pain. Cholelithiasis is also referred to as gallstones. There are two main types of gallstones:  Cholesterol stones. These are made of hardened cholesterol and are usually yellow-green in color. They are the most common type of gallstone. Cholesterol is a white, waxy, fat-like substance that is made in the liver.  Pigment stones. These are dark in color and are made of a red-yellow substance that forms when hemoglobin from red blood cells breaks down (bilirubin). What are the causes? This condition may be caused by an imbalance in the substances that bile is made of. This can happen if the bile:  Has too much bilirubin.  Has too much cholesterol.  Does not have enough bile salts. These salts help the body absorb and digest fats. In some cases, this condition can also be caused by the gallbladder not emptying completely or often enough. What increases the risk? The following factors may make you more likely to develop this condition:  Being female.  Having multiple pregnancies. Health care providers sometimes advise removing diseased gallbladders before future pregnancies.  Eating a diet that is heavy in fried foods, fat, and refined carbohydrates, like white bread and white rice.  Being obese.  Being older than age 57.  Prolonged use of medicines that contain female hormones (estrogen).  Having diabetes mellitus.  Rapidly losing  weight.  Having a family history of gallstones.  Being of [place] or [place] descent.  Having an intestinal disease such as Crohn disease.  Having metabolic syndrome.  Having cirrhosis.  Having severe types of anemia such as sickle cell anemia. What are the signs or symptoms? In most cases, there are no symptoms. These are known as silent gallstones. If a gallstone blocks the bile ducts, it can cause a gallbladder attack. The main symptom of a gallbladder attack is sudden pain in the upper right abdomen. The pain usually comes at night or after eating a large meal. The pain can last for one or several hours and can spread to the right shoulder or chest. If the bile duct is blocked for more than a few hours, it can cause infection or inflammation of the gallbladder, liver, or pancreas, which may cause:  Nausea.  Vomiting.  Abdominal pain that lasts for 5 hours or more.  Fever or chills.  Yellowing of the skin or the whites of the eyes (jaundice).  Dark urine.  Light-colored stools. How is this diagnosed? This condition may be diagnosed based on:  A physical exam.  Your medical history.  An ultrasound of your gallbladder.  CT scan.  MRI.  Blood tests to check for signs of infection or inflammation.  A scan of your gallbladder and bile ducts (biliary system) using nonharmful radioactive material and special cameras that can see the radioactive material (cholescintigram). This test checks to see how your gallbladder contracts and whether bile ducts are blocked.  Inserting a small tube with a camera on the end (endoscope) through your mouth to inspect bile ducts and check  for blockages (endoscopic retrograde cholangiopancreatogram). How is this treated? Treatment for gallstones depends on the severity of the condition. Silent gallstones do not need treatment. If the gallstones cause a gallbladder attack or other symptoms, treatment may be required. Options for  treatment include:  Surgery to remove the gallbladder (cholecystectomy). This is the most common treatment.  Medicines to dissolve gallstones. These are most effective at treating small gallstones. You may need to take medicines for up to 6-12 months.  Shock wave treatment (extracorporeal biliary lithotripsy). In this treatment, an ultrasound machine sends shock waves to the gallbladder to break gallstones into smaller pieces. These pieces can then be passed into the intestines or be dissolved by medicine. This is rarely used.  Removing gallstones through endoscopic retrograde cholangiopancreatogram. A small basket can be attached to the endoscope and used to capture and remove gallstones. Follow these instructions at home:  Take over-the-counter and prescription medicines only as told by your health care provider.  Maintain a healthy weight and follow a healthy diet. This includes: ? Reducing fatty foods, such as fried food. ? Reducing refined carbohydrates, like white bread and white rice. ? Increasing fiber. Aim for foods like almonds, fruit, and beans.  Keep all follow-up visits as told by your health care provider. This is important. Contact a health care provider if:  You think you have had a gallbladder attack.  You have been diagnosed with silent gallstones and you develop abdominal pain or indigestion. Get help right away if:  You have pain from a gallbladder attack that lasts for more than 2 hours.  You have abdominal pain that lasts for more than 5 hours.  You have a fever or chills.  You have persistent nausea and vomiting.  You develop jaundice.  You have dark urine or light-colored stools. Summary  Cholelithiasis (also called gallstones) is a form of gallbladder disease in which gallstones form in the gallbladder.  This condition is caused by an imbalance in the substances that make up bile. This can happen if the bile has too much cholesterol, too much  bilirubin, or not enough bile salts.  You are more likely to develop this condition if you are female, pregnant, using medicines with estrogen, obese, older than age 57, or have a family history of gallstones. You may also develop gallstones if you have diabetes, an intestinal disease, cirrhosis, or metabolic syndrome.  Treatment for gallstones depends on the severity of the condition. Silent gallstones do not need treatment.  If gallstones cause a gallbladder attack or other symptoms, treatment may be needed. The most common treatment is surgery to remove the gallbladder. This information is not intended to replace advice given to you by your health care provider. Make sure you discuss any questions you have with your health care provider. Document Released: 02/09/2005 Document Revised: 01/26/2017 Document Reviewed: 10/31/2015 Elsevier Patient Education  2020 [employer].

## 2019-01-28 ENCOUNTER — Other Ambulatory Visit: Payer: Self-pay | Admitting: Internal Medicine

## 2019-01-31 ENCOUNTER — Other Ambulatory Visit: Payer: Self-pay | Admitting: Surgery

## 2019-02-25 ENCOUNTER — Other Ambulatory Visit: Payer: Self-pay | Admitting: Surgery

## 2019-02-28 HISTORY — PX: CHOLECYSTECTOMY: SHX55

## 2019-03-11 ENCOUNTER — Encounter: Payer: Self-pay | Admitting: Internal Medicine

## 2019-03-11 ENCOUNTER — Ambulatory Visit (INDEPENDENT_AMBULATORY_CARE_PROVIDER_SITE_OTHER): Payer: No Typology Code available for payment source | Admitting: Internal Medicine

## 2019-03-11 DIAGNOSIS — F419 Anxiety disorder, unspecified: Secondary | ICD-10-CM

## 2019-03-11 DIAGNOSIS — J4520 Mild intermittent asthma, uncomplicated: Secondary | ICD-10-CM | POA: Diagnosis not present

## 2019-03-11 DIAGNOSIS — F17200 Nicotine dependence, unspecified, uncomplicated: Secondary | ICD-10-CM

## 2019-03-11 MED ORDER — LEVOFLOXACIN 500 MG PO TABS: 500.0000 mg | ORAL_TABLET | Freq: Every day | ORAL | 0 refills | Status: AC

## 2019-03-11 MED ORDER — HYDROCODONE-HOMATROPINE 5-1.5 MG/5ML PO SYRP: 5.0000 mL | ORAL_SOLUTION | Freq: Four times a day (QID) | ORAL | 0 refills | Status: AC | PRN

## 2019-03-11 MED ORDER — ALBUTEROL SULFATE HFA 108 (90 BASE) MCG/ACT IN AERS: 2.0000 | INHALATION_SPRAY | Freq: Four times a day (QID) | RESPIRATORY_TRACT | 11 refills | Status: DC | PRN

## 2019-03-11 MED ORDER — PREDNISONE 10 MG PO TABS: ORAL_TABLET | ORAL | 0 refills | Status: DC

## 2019-03-11 NOTE — Assessment & Plan Note (Signed)
Mild to mod, for antibx course, cough med prn, predpac asd, albuterol prn, and continue symbicort bid, to f/u any worsening symptoms or concerns

## 2019-03-11 NOTE — Progress Notes (Signed)
Patient ID: Marilyn [name], female   DOB: 1955/11/26, 64 y.o.   MRN: [id number]  Virtual Visit via Video Note  I connected with Marilyn [name] on 03/11/19 at  3:40 PM EST by a video enabled telemedicine application and verified that I am speaking with the correct person using two identifiers.  Location: Patient: at home Provider: at office   I discussed the limitations of evaluation and management by telemedicine and the availability of in person appointments. The patient expressed understanding and agreed to proceed.  History of Present Illness: Here with acute onset mild to mod 2-3 days ST, HA, general weakness and malaise, with prod cough greenish sputum, but Pt denies chest pain, increased sob or doe, wheezing, orthopnea, PND, increased LE swelling, palpitations, dizziness or syncope, except for wheezing and sob in the past day similar to previous copd exacerbations.  Pt denies new neurological symptoms such as new headache, or facial or extremity weakness or numbness   Pt denies polydipsia, polyuria Past Medical History:  Diagnosis Date  . Allergy   . Anemia    hx  . Anxiety    panic disorder  . 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 Cancer ([place])    BREAST - left  . Cigarette smoker   . Colon polyp   . Complication of anesthesia    DIFFICULTY AWAKENING  . Depression   . Diverticulosis of colon   . DJD (degenerative joint disease)   . DJD (degenerative joint disease)   . Family history of breast cancer   . Family history of colon cancer   . Family history of pancreatic cancer   . Family history of prostate cancer   . Gallstones   . GERD (gastroesophageal reflux disease)    Pt. denies having GERD. Unable to remove it.  Marland Kitchen Headache(784.0)   . History of anemia   . History of bronchitis   . History of sarcoidosis   . Hyperlipidemia    no meds needed  . Incisional breast wound    AT AGE 89  BILATERAL INCISION TO BREAST MADE  . Leg swelling   . Panic disorder   . Personal history of radiation therapy   . S/P radiation therapy 08/17/11 - 09/28/11   LLQ - 50 Gy/25 Fractions with Boost of 10 Gy / 5 fractions  . Sialoadenitis of submandibular gland 2016  . Vitamin D deficiency    Past Surgical History:  Procedure Laterality Date  . BIOPSY BREAST     left breast  as a teenager  benign  . BREAST BIOPSY Left 06/06/2011  . BREAST EXCISIONAL BIOPSY Bilateral   . BREAST LUMPECTOMY Left 2013  . BREAST SURGERY  07/05/11   left breast lumpectomy with needle loc & axillary sln bx  . BUNIONECTOMY  2011   bilateral by Dr Little Ishikawa  . COLONOSCOPY     polyp  . cyst removed from right hand  1995  . TONSILLECTOMY  1972  . TOTAL ABDOMINAL HYSTERECTOMY  2000   Dr Nori Riis  . TRIGGER FINGER RELEASE  2008   Dr Burney Gauze    reports that she has been smoking cigarettes. She has been smoking about 0.50 packs per day. She has never used smokeless tobacco. She reports current alcohol use. She reports that she does not use drugs. family history includes Anemia in her mother; Bladder Cancer in her father; Brain cancer in an other family member; Breast cancer in  her cousin, cousin, and sister; Colon cancer (age of onset: 99) in her father; Dementia in her maternal aunt and maternal aunt; Hyperlipidemia in her brother; Hypertension in her brother; Lung cancer in her maternal uncle, paternal aunt, and sister; Multiple sclerosis in her sister; Other in her mother; Pancreatic cancer (age of onset: 81) in her sister; Prostate cancer in her brother and father; Sarcoidosis in her cousin and sister; Seizures in her maternal uncle and mother. Allergies  Allergen Reactions  . Lexapro [Escitalopram Oxalate]     Felt like zombie  . Penicillins Rash and Other (See Comments)    At injection site.   Current Outpatient Medications on File Prior to Visit  Medication Sig Dispense Refill  . busPIRone (BUSPAR) 30 MG tablet  TAKE 1 /2 (ONE-HALF) TO 1 TABLET TWICE DAILY 60 tablet 5  . clobetasol cream (TEMOVATE) [id number] % Apply 1 application topically 2 (two) times daily. (Patient taking differently: Apply 1 application topically 2 (two) times daily as needed. ) 60 g 3  . clonazePAM (KLONOPIN) 0.5 MG tablet TAKE 1/2 TO 1 TABLET BY MOUTH TWICE A DAY AS NEEDED 60 tablet 2  . pantoprazole (PROTONIX) 40 MG tablet Take 1 tablet (40 mg total) by mouth daily. 90 tablet 3  . budesonide-formoterol (SYMBICORT) 80-4.5 MCG/ACT inhaler Inhale 2 puffs into the lungs 2 (two) times daily. (Patient not taking: Reported on 03/11/2019) 1 Inhaler 11  . Cholecalciferol 5000 UNITS TABS Take 1 tablet by mouth 3 (three) times a week.    . Multiple Vitamin (MULTIVITAMIN WITH MINERALS) TABS tablet Take 1 tablet by mouth daily.     No current facility-administered medications on file prior to visit.    Observations/Objective: Alert, NAD, appropriate mood and affect, resps normal, cn 2-12 intact, moves all 4s, no visible rash or swelling Lab Results  Component Value Date   WBC 7.9 11/12/2018   HGB 12.9 11/12/2018   HCT 39.8 11/12/2018   PLT 194.0 11/12/2018   GLUCOSE 96 11/12/2018   CHOL 226 (H) 09/18/2018   TRIG 103.0 09/18/2018   HDL 72.90 09/18/2018   LDLDIRECT 118.9 12/18/2011   LDLCALC 132 (H) 09/18/2018   ALT 10 11/12/2018   AST 11 11/12/2018   NA 139 11/12/2018   K 4.0 11/12/2018   CL 106 11/12/2018   CREATININE 0.80 11/12/2018   BUN 16 11/12/2018   CO2 27 11/12/2018   TSH 1.33 09/18/2018   Assessment and Plan: See notes  Follow Up Instructions: See notes   I discussed the assessment and treatment plan with the patient. The patient was provided an opportunity to ask questions and all were answered. The patient agreed with the plan and demonstrated an understanding of the instructions.   The patient was advised to call back or seek an in-person evaluation if the symptoms worsen or if the condition fails to improve as  anticipated.   Cathlean Cower, MD

## 2019-03-11 NOTE — Assessment & Plan Note (Signed)
Encouraged to quit, not ready

## 2019-03-11 NOTE — Assessment & Plan Note (Signed)
Mild situational, cont same tx,  to f/u any worsening symptoms or concerns 

## 2019-03-11 NOTE — Patient Instructions (Signed)
Please take all new medication as prescribed 

## 2019-03-12 ENCOUNTER — Other Ambulatory Visit: Payer: Self-pay

## 2019-03-12 ENCOUNTER — Ambulatory Visit (INDEPENDENT_AMBULATORY_CARE_PROVIDER_SITE_OTHER): Payer: No Typology Code available for payment source

## 2019-03-12 ENCOUNTER — Ambulatory Visit (HOSPITAL_COMMUNITY)
Admission: EM | Admit: 2019-03-12 | Discharge: 2019-03-12 | Disposition: A | Discharge status: Home / Self Care | Payer: No Typology Code available for payment source | Attending: Family Medicine | Admitting: Family Medicine

## 2019-03-12 DIAGNOSIS — D869 Sarcoidosis, unspecified: Secondary | ICD-10-CM | POA: Insufficient documentation

## 2019-03-12 DIAGNOSIS — R042 Hemoptysis: Secondary | ICD-10-CM | POA: Insufficient documentation

## 2019-03-12 DIAGNOSIS — F419 Anxiety disorder, unspecified: Secondary | ICD-10-CM | POA: Insufficient documentation

## 2019-03-12 DIAGNOSIS — Z801 Family history of malignant neoplasm of trachea, bronchus and lung: Secondary | ICD-10-CM | POA: Insufficient documentation

## 2019-03-12 DIAGNOSIS — R05 Cough: Secondary | ICD-10-CM | POA: Diagnosis present

## 2019-03-12 DIAGNOSIS — Z20822 Contact with and (suspected) exposure to covid-19: Secondary | ICD-10-CM | POA: Diagnosis not present

## 2019-03-12 DIAGNOSIS — Z888 Allergy status to other drugs, medicaments and biological substances status: Secondary | ICD-10-CM | POA: Diagnosis not present

## 2019-03-12 DIAGNOSIS — Z853 Personal history of malignant neoplasm of breast: Secondary | ICD-10-CM | POA: Diagnosis not present

## 2019-03-12 DIAGNOSIS — J449 Chronic obstructive pulmonary disease, unspecified: Secondary | ICD-10-CM | POA: Diagnosis not present

## 2019-03-12 DIAGNOSIS — Z79899 Other long term (current) drug therapy: Secondary | ICD-10-CM | POA: Insufficient documentation

## 2019-03-12 DIAGNOSIS — Z880 Allergy status to penicillin: Secondary | ICD-10-CM | POA: Insufficient documentation

## 2019-03-12 DIAGNOSIS — Z8249 Family history of ischemic heart disease and other diseases of the circulatory system: Secondary | ICD-10-CM | POA: Insufficient documentation

## 2019-03-12 DIAGNOSIS — Z8052 Family history of malignant neoplasm of bladder: Secondary | ICD-10-CM | POA: Insufficient documentation

## 2019-03-12 DIAGNOSIS — Z803 Family history of malignant neoplasm of breast: Secondary | ICD-10-CM | POA: Insufficient documentation

## 2019-03-12 DIAGNOSIS — F17210 Nicotine dependence, cigarettes, uncomplicated: Secondary | ICD-10-CM | POA: Diagnosis not present

## 2019-03-12 DIAGNOSIS — K219 Gastro-esophageal reflux disease without esophagitis: Secondary | ICD-10-CM | POA: Diagnosis not present

## 2019-03-12 DIAGNOSIS — R059 Cough, unspecified: Secondary | ICD-10-CM

## 2019-03-12 DIAGNOSIS — Z800 Family history of malignant neoplasm of digestive organs: Secondary | ICD-10-CM | POA: Diagnosis not present

## 2019-03-12 DIAGNOSIS — E559 Vitamin D deficiency, unspecified: Secondary | ICD-10-CM | POA: Insufficient documentation

## 2019-03-12 MED ORDER — BENZONATATE 200 MG PO CAPS: 200.0000 mg | ORAL_CAPSULE | Freq: Three times a day (TID) | ORAL | 0 refills | Status: AC | PRN

## 2019-03-12 NOTE — ED Provider Notes (Signed)
[place]    CSN: [id number] Arrival date & time: 03/12/19  1232      History   Chief Complaint Chief Complaint  Patient presents with  . Cough    HPI Marilyn [name] is a 64 y.o. female history of tobacco use, COPD, GERD, sarcoidosis, presenting today for evaluation of hemoptysis.  Patient states that over the past 4 to 5 days she has developed cough, congestion as well as sneezing.  Feels similar to her prior colds that she has had.  She is unsure of any fevers.  Denies any known exposure to Covid.  She had a virtual visit with her primary yesterday and was prescribed levofloxacin, course of prednisone, albuterol and hydrocodone/homatroprine cough syrup.  She notes that in the past 24 to 48 hours she has noticed that her phlegm has been streaked with blood which is concerning to her.  HPI  Past Medical History:  Diagnosis Date  . Allergy   . Anemia    hx  . Anxiety    panic disorder  . Breast cancer ([place]) 06/06/2011   bc  left breast 3 o'clock dx=invasive ductal ca uoqER/PR=positive  . Cancer ([place])    BREAST - left  . Cigarette smoker   . Colon polyp   . Complication of anesthesia    DIFFICULTY AWAKENING  . Depression   . Diverticulosis of colon   . DJD (degenerative joint disease)   . DJD (degenerative joint disease)   . Family history of breast cancer   . Family history of colon cancer   . Family history of pancreatic cancer   . Family history of prostate cancer   . Gallstones   . GERD (gastroesophageal reflux disease)    Pt. denies having GERD. Unable to remove it.  Marland Kitchen Headache(784.0)   . History of anemia   . History of bronchitis   . History of sarcoidosis   . Hyperlipidemia    no meds needed  . Incisional breast wound    AT AGE 57 BILATERAL INCISION TO BREAST MADE  . Leg swelling   . Panic disorder   . Personal history of radiation therapy   . S/P radiation therapy 08/17/11 - 09/28/11   LLQ - 50 Gy/25 Fractions with Boost of 10 Gy / 5  fractions  . Sialoadenitis of submandibular gland 2016  . Vitamin D deficiency     Patient Active Problem List   Diagnosis Date Noted  . Cholelithiasis 12/26/2018  . Genetic testing 12/26/2017  . Family history of breast cancer   . Family history of pancreatic cancer   . Family history of colon cancer   . Abdominal pain 07/28/2016  . Smoker 05/08/2016  . Obesity 03/13/2016  . Dyspnea 11/09/2015  . Well adult exam 07/23/2014  . Sialoadenitis 06/02/2014  . Edema 07/07/2013  . AB (asthmatic bronchitis) 06/23/2013  . Anxiety   . Malignant neoplasm of upper-outer quadrant of left breast in female, estrogen receptor positive ([place]) 06/09/2011  . COLONIC POLYPS 01/25/2010  . DIVERTICULOSIS OF COLON 01/25/2010  . DEGENERATIVE JOINT DISEASE 11/06/2008  . PANIC DISORDER 07/23/2008  . Sarcoidosis 10/13/2007  . Vitamin D deficiency 10/13/2007  . ANEMIA 10/13/2007  . CIGARETTE SMOKER 10/13/2007  . BRONCHITIS 10/13/2007  . Dyslipidemia 04/23/2007  . Adjustment disorder with mixed anxiety and depressed mood 04/23/2007  . Venous (peripheral) insufficiency 04/23/2007  . HEADACHE 04/23/2007    Past Surgical History:  Procedure Laterality Date  . BIOPSY BREAST     left  breast  as a teenager  benign  . BREAST BIOPSY Left 06/06/2011  . BREAST EXCISIONAL BIOPSY Bilateral   . BREAST LUMPECTOMY Left 2013  . BREAST SURGERY  07/05/11   left breast lumpectomy with needle loc & axillary sln bx  . BUNIONECTOMY  2011   bilateral by Dr Little Ishikawa  . COLONOSCOPY     polyp  . cyst removed from right hand  1995  . TONSILLECTOMY  1972  . TOTAL ABDOMINAL HYSTERECTOMY  2000   Dr Nori Riis  . TRIGGER FINGER RELEASE  2008   Dr Burney Gauze    OB History   No obstetric history on file.    Obstetric Comments  Menarche age 89, nulliparity, HRT x 1 year, Hysterectomy         Home Medications    Prior to Admission medications   Medication Sig Start Date End Date Taking? Authorizing Provider  albuterol  (VENTOLIN HFA) 108 (90 Base) MCG/ACT inhaler Inhale 2 puffs into the lungs every 6 (six) hours as needed for wheezing or shortness of breath. 03/11/19   Biagio Borg, MD  benzonatate (TESSALON) 200 MG capsule Take 1 capsule (200 mg total) by mouth 3 (three) times daily as needed for up to 7 days for cough. 03/12/19 03/19/19  Yaretzy Olazabal C, PA-C  budesonide-formoterol (SYMBICORT) 80-4.5 MCG/ACT inhaler Inhale 2 puffs into the lungs 2 (two) times daily. Patient not taking: Reported on 03/11/2019 03/22/18   Plotnikov, Evie Lacks, MD  busPIRone (BUSPAR) 30 MG tablet TAKE 1 /2 (ONE-HALF) TO 1 TABLET TWICE DAILY 01/29/19   Plotnikov, Evie Lacks, MD  Cholecalciferol 5000 UNITS TABS Take 1 tablet by mouth 3 (three) times a week.    [provider]  clobetasol cream (TEMOVATE) AB-123456789 % Apply 1 application topically 2 (two) times daily. Patient taking differently: Apply 1 application topically 2 (two) times daily as needed.  09/13/17   Plotnikov, Evie Lacks, MD  clonazePAM (KLONOPIN) 0.5 MG tablet TAKE 1/2 TO 1 TABLET BY MOUTH TWICE A DAY AS NEEDED 09/18/18   Plotnikov, Evie Lacks, MD  HYDROcodone-homatropine (HYCODAN) 5-1.5 MG/5ML syrup Take 5 mLs by mouth every 6 (six) hours as needed for up to 10 days for cough. 03/11/19 03/21/19  Biagio Borg, MD  levofloxacin (LEVAQUIN) 500 MG tablet Take 1 tablet (500 mg total) by mouth daily for 10 days. 03/11/19 03/21/19  Biagio Borg, MD  Multiple Vitamin (MULTIVITAMIN WITH MINERALS) TABS tablet Take 1 tablet by mouth daily.    [provider]  pantoprazole (PROTONIX) 40 MG tablet Take 1 tablet (40 mg total) by mouth daily. 11/12/18   Plotnikov, Evie Lacks, MD  predniSONE (DELTASONE) 10 MG tablet 3 tabs by mouth per day for 3 days,2tabs per day for 3 days,1tab per day for 3 days 03/11/19   Biagio Borg, MD    Family History Family History  Problem Relation Age of Onset  . Anemia Mother   . Other Mother        + H Pylori  . Seizures Mother   . Prostate  cancer Father        dx over 10  . Colon cancer Father 63       diagnosed 2009  . Bladder Cancer Father        dx over 48  . Breast cancer Sister        dx in her 92s; mat half sister  . Pancreatic cancer Sister 48       d. 46  .  Lung cancer Sister   . Sarcoidosis Sister        mat 1/2 sister  . Multiple sclerosis Sister        #2  . Hypertension Brother        #1  . Hyperlipidemia Brother   . Prostate cancer Brother        dx under 67  . Breast cancer Cousin        pat first cousin d. 75  . Dementia Maternal Aunt   . Seizures Maternal Uncle   . Lung cancer Paternal Aunt   . Brain cancer Other        benign  . Dementia Maternal Aunt   . Lung cancer Maternal Uncle   . Sarcoidosis Cousin        mat first cousin  . Breast cancer Cousin        maternal 2nd cousins - distant  . Esophageal cancer Neg Hx   . Stomach cancer Neg Hx   . Rectal cancer Neg Hx     Social History Social History   Tobacco Use  . Smoking status: Current Every Day Smoker    Packs/day: 0.50    Types: Cigarettes    Last attempt to quit: 04/16/2012    Years since quitting: 6.9  . Smokeless tobacco: Never Used  Substance Use Topics  . Alcohol use: Yes    Alcohol/week: 0.0 - 1.0 standard drinks    Comment: social  . Drug use: No     Allergies   Lexapro [escitalopram oxalate] and Penicillins   Review of Systems Review of Systems  Constitutional: Negative for activity change, appetite change, chills, fatigue and fever.  HENT: Positive for congestion and sneezing. Negative for ear pain, rhinorrhea, sinus pressure, sore throat and trouble swallowing.   Eyes: Negative for discharge and redness.  Respiratory: Positive for cough. Negative for chest tightness and shortness of breath.   Cardiovascular: Negative for chest pain.  Gastrointestinal: Negative for abdominal pain, diarrhea, nausea and vomiting.  Musculoskeletal: Negative for myalgias.  Skin: Negative for rash.  Neurological: Negative  for dizziness, light-headedness and headaches.     Physical Exam Triage Vital Signs ED Triage Vitals  Enc Vitals Group     BP 03/12/19 1324 (!) 152/89     Pulse Rate 03/12/19 1324 86     Resp 03/12/19 1324 18     Temp 03/12/19 1324 98.8 F (37.1 C)     Temp Source 03/12/19 1324 Oral     SpO2 03/12/19 1324 97 %     Weight 03/12/19 1323 196 lb 6.4 oz (89.1 kg)     Height --      Head Circumference --      Peak Flow --      Pain Score 03/12/19 1323 3     Pain Loc --      Pain Edu? --      Excl. in [place]? --    No data found.  Updated Vital Signs BP (!) 152/89 (BP Location: Right Arm)   Pulse 86   Temp 98.8 F (37.1 C) (Oral)   Resp 18   Wt 196 lb 6.4 oz (89.1 kg)   SpO2 97%   BMI 32.68 kg/m   Visual Acuity Right Eye Distance:   Left Eye Distance:   Bilateral Distance:    Right Eye Near:   Left Eye Near:    Bilateral Near:     Physical Exam Vitals and nursing note reviewed.  Constitutional:  General: She is not in acute distress.    Appearance: She is well-developed.  HENT:     Head: Normocephalic and atraumatic.     Ears:     Comments: Bilateral ears without tenderness to palpation of external auricle, tragus and mastoid, EAC's without erythema or swelling, TM's with good bony landmarks and cone of light. Non erythematous.    Mouth/Throat:     Comments: Oral mucosa pink and moist, no tonsillar enlargement or exudate. Posterior pharynx patent and nonerythematous, no uvula deviation or swelling. Normal phonation. Eyes:     Conjunctiva/sclera: Conjunctivae normal.  Cardiovascular:     Rate and Rhythm: Normal rate and regular rhythm.     Heart sounds: No murmur.  Pulmonary:     Effort: Pulmonary effort is normal. No respiratory distress.     Breath sounds: Normal breath sounds.     Comments: Breathing comfortably at rest, CTABL, no wheezing, rales or other adventitious sounds auscultated Abdominal:     Palpations: Abdomen is soft.     Tenderness: There is  no abdominal tenderness.  Musculoskeletal:     Cervical back: Neck supple.  Skin:    General: Skin is warm and dry.  Neurological:     Mental Status: She is alert.      UC Treatments / Results  Labs (all labs ordered are listed, but only abnormal results are displayed) Labs Reviewed  NOVEL CORONAVIRUS, NAA (HOSP ORDER, SEND-OUT TO REF LAB; TAT 18-24 HRS)    EKG   Radiology DG Chest 2 View  Result Date: 03/12/2019 CLINICAL DATA:  Hemoptysis.  History of COPD and sarcoidosis. EXAM: CHEST - 2 VIEW COMPARISON:  Chest x-ray dated August 14, 2017. FINDINGS: The heart size and mediastinal contours are within normal limits. Atherosclerotic calcification of the aortic arch. The lungs remain hyperinflated with slightly coarsened interstitial markings. No focal consolidation, pleural effusion, or pneumothorax. No acute osseous abnormality. Unchanged surgical clips in the left breast. IMPRESSION: No active cardiopulmonary disease. Electronically Signed   By: Titus Dubin M.D.   On: 03/12/2019 14:05    Procedures Procedures (including critical care time)  Medications Ordered in UC Medications - No data to display  Initial Impression / Assessment and Plan / UC Course  I have reviewed the triage vital signs and the nursing notes.  Pertinent labs & imaging results that were available during my care of the patient were reviewed by me and considered in my medical decision making (see chart for details).     X-ray negative for pneumonia, vital signs stable today.  Covid PCR pending.  Continue with medicines provided by PCP yesterday, will provide Tessalon as alternative cough medicine for during the day.  Rest and push fluids.  Discussed strict return precautions. Patient verbalized understanding and is agreeable with plan.  Final Clinical Impressions(s) / UC Diagnoses   Final diagnoses:  Cough  Hemoptysis     Discharge Instructions     Continue with medicines prescribed to you  yesterday Chest x-ray was normal May use Tessalon as alternative to cough syrup as needed every 8 hours  Covid swab pending, returns in approximately 2 days, we will call you if positive If positive you will need to quarantine for 10 days as well as until symptoms improving and fever free for 24 hours   ED Prescriptions    Medication Sig Dispense Auth. Provider   benzonatate (TESSALON) 200 MG capsule Take 1 capsule (200 mg total) by mouth 3 (three) times daily as needed for  up to 7 days for cough. 28 capsule Oz Gammel, [place] C, PA-C     PDMP not reviewed this encounter.   Janith Lima, PA-C 03/12/19 1527

## 2019-03-12 NOTE — Discharge Instructions (Signed)
Continue with medicines prescribed to you yesterday Chest x-ray was normal May use Tessalon as alternative to cough syrup as needed every 8 hours  Covid swab pending, returns in approximately 2 days, we will call you if positive If positive you will need to quarantine for 10 days as well as until symptoms improving and fever free for 24 hours

## 2019-03-12 NOTE — ED Triage Notes (Signed)
Pt states she has cough , congestion and sneezing. This started on last Saturday. Pt has done a virtual visit with her PCP. Pt was given a some antibiotics.Marland Kitchen

## 2019-03-13 ENCOUNTER — Ambulatory Visit: Payer: Self-pay

## 2019-03-13 ENCOUNTER — Telehealth: Payer: Self-pay | Admitting: Internal Medicine

## 2019-03-13 NOTE — Telephone Encounter (Signed)
Incoming call from Patient who complains of taking anti biotic an prednisone is having side effect of insomnia.   Went to Urgent.  Care  Last night night.   Had a chest.   xray occasionally coughing up blood.   Patient want a return call in am from  Dr. Norval Morton.         Answer Assessment - Initial Assessment Questions 1. ONSET: "When did you start coughing up blood?"   yesterday 2. SEVERITY: "How many times?" "How much blood?" (e.g., flecks, streaks, tablespoons, etc)     3 times or more.  3. COUGHING SPASMS: "Did the blood appear after a coughing spell?"       yes 4. RESPIRATORY DISTRESS: "Describe your breathing."      no 5. FEVER: "Do you have a fever?" If so, ask: "What is your temperature, how was it measured, and when did it start?"    Denies fever.  6. SPUTUM: "Describe the color of your sputum" (clear, white, yellow, green), "Has there been any change recently?"      7. CARDIAC HISTORY: "Do you have any history of heart disease?" (e.g., heart attack, congestive heart failure)      denies 8. LUNG HISTORY: "Do you have any history of lung disease?"  (e.g., pulmonary embolus, asthma, emphysema)     Yes  sarcodosis 9. PE RISK FACTORS: "Do you have a history of blood clots?" (or: recent major surgery, recent prolonged travel, bedridden)    gallbladder removed 10. OTHER SYMPTOMS: "Do you have any other symptoms?" (e.g., nosebleed, chest pain, abdominal pain, vomiting)       *No Answer* 11. PREGNANCY: "Is there any chance you are pregnant?" "When was your last menstrual period?"       na 12. TRAVEL: "Have you traveled out of the country in the last month?" (e.g., travel history, exposures)       na  Protocols used: COUGHING UP BLOOD-A-AH

## 2019-03-13 NOTE — Telephone Encounter (Signed)
Copied from [place] [id number]. Topic: General - Inquiry >> Mar 13, 2019  4:29 PM Mathis Bud wrote: Reason for CRM: Patient states she would like nurse to call back regarding her bad congestion.  Patient went to urgent care yesterday and got chest xray.  Patient would like discuss this with PCP nurse.  Patient states she has been staying up for the past two nights Call back [phone]

## 2019-03-14 LAB — NOVEL CORONAVIRUS, NAA (HOSP ORDER, SEND-OUT TO REF LAB; TAT 18-24 HRS): SARS-CoV-2, NAA: NOT DETECTED

## 2019-03-14 NOTE — Telephone Encounter (Signed)
Concerns address with pt. Pt stated antibiotic was causing side effects and informed pt the her decision to stop it was correct.

## 2019-04-20 ENCOUNTER — Ambulatory Visit: Payer: No Typology Code available for payment source | Attending: Internal Medicine

## 2019-04-20 DIAGNOSIS — Z23 Encounter for immunization: Secondary | ICD-10-CM | POA: Insufficient documentation

## 2019-04-20 NOTE — Progress Notes (Signed)
   Covid-19 Vaccination Clinic  Name:  Zeana Deyoung    MRN: [id number] DOB: [birth year]  04/20/2019  Ms. Chirino was observed post Covid-19 immunization for 15 minutes without incidence. She was provided with Vaccine Information Sheet and instruction to access the V-Safe system.   Ms. Mishkin was instructed to call 911 with any severe reactions post vaccine: Marland Kitchen Difficulty breathing  . Swelling of your face and throat  . A fast heartbeat  . A bad rash all over your body  . Dizziness and weakness    Immunizations Administered    Name Date Dose VIS Date Route   Pfizer COVID-19 Vaccine 04/20/2019  4:00 PM 0.3 mL 02/07/2019 Intramuscular   Manufacturer: [place]   Lot: [id number]   [place]: [id number]

## 2019-04-28 ENCOUNTER — Other Ambulatory Visit: Payer: Self-pay

## 2019-04-28 ENCOUNTER — Ambulatory Visit: Payer: No Typology Code available for payment source | Admitting: Internal Medicine

## 2019-04-28 ENCOUNTER — Encounter: Payer: Self-pay | Admitting: Internal Medicine

## 2019-04-28 DIAGNOSIS — J4520 Mild intermittent asthma, uncomplicated: Secondary | ICD-10-CM | POA: Diagnosis not present

## 2019-04-28 DIAGNOSIS — E559 Vitamin D deficiency, unspecified: Secondary | ICD-10-CM | POA: Diagnosis not present

## 2019-04-28 DIAGNOSIS — F17200 Nicotine dependence, unspecified, uncomplicated: Secondary | ICD-10-CM

## 2019-04-28 DIAGNOSIS — K8020 Calculus of gallbladder without cholecystitis without obstruction: Secondary | ICD-10-CM | POA: Diagnosis not present

## 2019-04-28 MED ORDER — BUDESONIDE-FORMOTEROL FUMARATE 80-4.5 MCG/ACT IN AERO: 2.0000 | INHALATION_SPRAY | Freq: Two times a day (BID) | RESPIRATORY_TRACT | 11 refills | Status: DC

## 2019-04-28 MED ORDER — TRIAMCINOLONE ACETONIDE 0.5 % EX OINT: 1.0000 "application " | TOPICAL_OINTMENT | Freq: Four times a day (QID) | CUTANEOUS | 1 refills | Status: AC

## 2019-04-28 MED ORDER — PANTOPRAZOLE SODIUM 40 MG PO TBEC: 40.0000 mg | DELAYED_RELEASE_TABLET | Freq: Every day | ORAL | 3 refills | Status: DC

## 2019-04-28 NOTE — Progress Notes (Signed)
Subjective:  Patient ID: Marilyn [name], female    DOB: 10-10-1955  Age: 64 y.o. MRN: [id number]  CC: No chief complaint on file.   HPI Marilyn [name] presents for anxiety, COPD/asthmatic bronchitis, anxiety f/u C/o stress - ill parents    Outpatient Medications Prior to Visit  Medication Sig Dispense Refill  . albuterol (VENTOLIN HFA) 108 (90 Base) MCG/ACT inhaler Inhale 2 puffs into the lungs every 6 (six) hours as needed for wheezing or shortness of breath. 18 g 11  . budesonide-formoterol (SYMBICORT) 80-4.5 MCG/ACT inhaler Inhale 2 puffs into the lungs 2 (two) times daily. 1 Inhaler 11  . busPIRone (BUSPAR) 30 MG tablet TAKE 1 /2 (ONE-HALF) TO 1 TABLET TWICE DAILY 60 tablet 5  . Cholecalciferol 5000 UNITS TABS Take 1 tablet by mouth 3 (three) times a week.    . clobetasol cream (TEMOVATE) [id number] % Apply 1 application topically 2 (two) times daily. (Patient taking differently: Apply 1 application topically 2 (two) times daily as needed. ) 60 g 3  . clonazePAM (KLONOPIN) 0.5 MG tablet TAKE 1/2 TO 1 TABLET BY MOUTH TWICE A DAY AS NEEDED 60 tablet 2  . Multiple Vitamin (MULTIVITAMIN WITH MINERALS) TABS tablet Take 1 tablet by mouth daily.    . pantoprazole (PROTONIX) 40 MG tablet Take 1 tablet (40 mg total) by mouth daily. 90 tablet 3  . predniSONE (DELTASONE) 10 MG tablet 3 tabs by mouth per day for 3 days,2tabs per day for 3 days,1tab per day for 3 days (Patient not taking: Reported on 04/28/2019) 18 tablet 0   No facility-administered medications prior to visit.    ROS: Review of Systems  Constitutional: Negative for activity change, appetite change, chills, fatigue and unexpected weight change.  HENT: Negative for congestion, mouth sores and sinus pressure.   Eyes: Negative for visual disturbance.  Respiratory: Negative for cough and chest tightness.   Gastrointestinal: Negative for abdominal pain and nausea.  Genitourinary: Negative for difficulty urinating,  frequency and vaginal pain.  Musculoskeletal: Positive for arthralgias. Negative for back pain and gait problem.  Skin: Negative for pallor and rash.  Neurological: Negative for dizziness, tremors, weakness, numbness and headaches.  Psychiatric/Behavioral: Positive for sleep disturbance. Negative for confusion and suicidal ideas. The patient is nervous/anxious.     Objective:  BP 132/80 (BP Location: Right Arm, Patient Position: Sitting, Cuff Size: Normal)   Pulse 80   Temp 98.3 F (36.8 C) (Oral)   Ht 5\' 5"  (1.651 m)   Wt 189 lb (85.7 kg)   SpO2 96%   BMI 31.45 kg/m   BP Readings from Last 3 Encounters:  04/28/19 132/80  03/12/19 (!) 152/89  03/11/19 108/73    Wt Readings from Last 3 Encounters:  04/28/19 189 lb (85.7 kg)  03/12/19 196 lb 6.4 oz (89.1 kg)  03/11/19 185 lb (83.9 kg)    Physical Exam Constitutional:      General: She is not in acute distress.    Appearance: She is well-developed.  HENT:     Head: Normocephalic.     Right Ear: External ear normal.     Left Ear: External ear normal.     Nose: Nose normal.  Eyes:     General:        Right eye: No discharge.        Left eye: No discharge.     Conjunctiva/sclera: Conjunctivae normal.     Pupils: Pupils are equal, round, and reactive to light.  Neck:  Thyroid: No thyromegaly.     Vascular: No JVD.     Trachea: No tracheal deviation.  Cardiovascular:     Rate and Rhythm: Normal rate and regular rhythm.     Heart sounds: Normal heart sounds.  Pulmonary:     Effort: No respiratory distress.     Breath sounds: No stridor. No wheezing.  Abdominal:     General: Bowel sounds are normal. There is no distension.     Palpations: Abdomen is soft. There is no mass.     Tenderness: There is no abdominal tenderness. There is no guarding or rebound.  Musculoskeletal:        General: No tenderness.     Cervical back: Normal range of motion and neck supple.  Lymphadenopathy:     Cervical: No cervical  adenopathy.  Skin:    Findings: No erythema or rash.  Neurological:     Mental Status: She is oriented to person, place, and time.     Cranial Nerves: No cranial nerve deficit.     Motor: No abnormal muscle tone.     Coordination: Coordination normal.     Deep Tendon Reflexes: Reflexes normal.  Psychiatric:        Behavior: Behavior normal.        Thought Content: Thought content normal.        Judgment: Judgment normal.   RLQ scar w/a nodule 4 mm  Lab Results  Component Value Date   WBC 7.9 11/12/2018   HGB 12.9 11/12/2018   HCT 39.8 11/12/2018   PLT 194.0 11/12/2018   GLUCOSE 96 11/12/2018   CHOL 226 (H) 09/18/2018   TRIG 103.0 09/18/2018   HDL 72.90 09/18/2018   LDLDIRECT 118.9 12/18/2011   LDLCALC 132 (H) 09/18/2018   ALT 10 11/12/2018   AST 11 11/12/2018   NA 139 11/12/2018   K 4.0 11/12/2018   CL 106 11/12/2018   CREATININE 0.80 11/12/2018   BUN 16 11/12/2018   CO2 27 11/12/2018   TSH 1.33 09/18/2018    DG Chest 2 View  Result Date: 03/12/2019 CLINICAL DATA:  Hemoptysis.  History of COPD and sarcoidosis. EXAM: CHEST - 2 VIEW COMPARISON:  Chest x-ray dated August 14, 2017. FINDINGS: The heart size and mediastinal contours are within normal limits. Atherosclerotic calcification of the aortic arch. The lungs remain hyperinflated with slightly coarsened interstitial markings. No focal consolidation, pleural effusion, or pneumothorax. No acute osseous abnormality. Unchanged surgical clips in the left breast. IMPRESSION: No active cardiopulmonary disease. Electronically Signed   By: Titus Dubin M.D.   On: 03/12/2019 14:05    Assessment & Plan:   There are no diagnoses linked to this encounter.   No orders of the defined types were placed in this encounter.    Follow-up: No follow-ups on file.  Walker Kehr, MD

## 2019-04-28 NOTE — Assessment & Plan Note (Signed)
1 ppd - discussed °

## 2019-04-28 NOTE — Assessment & Plan Note (Signed)
S/p cholecystectomy 12/20

## 2019-04-28 NOTE — Assessment & Plan Note (Signed)
1 ppd 

## 2019-04-28 NOTE — Assessment & Plan Note (Signed)
Vit D 

## 2019-05-14 ENCOUNTER — Ambulatory Visit: Payer: No Typology Code available for payment source | Attending: Internal Medicine

## 2019-05-14 DIAGNOSIS — Z23 Encounter for immunization: Secondary | ICD-10-CM

## 2019-05-14 NOTE — Progress Notes (Signed)
   Covid-19 Vaccination Clinic  Name:  Krisy Balkema    MRN: [id number] DOB: 10/17/1955  05/14/2019  Ms. Vanamburg was observed post Covid-19 immunization for 15 minutes without incident. She was provided with Vaccine Information Sheet and instruction to access the V-Safe system.   Ms. Chlebowski was instructed to call 911 with any severe reactions post vaccine: Marland Kitchen Difficulty breathing  . Swelling of face and throat  . A fast heartbeat  . A bad rash all over body  . Dizziness and weakness   Immunizations Administered    Name Date Dose VIS Date Route   Pfizer COVID-19 Vaccine 05/14/2019 12:05 PM 0.3 mL 02/07/2019 Intramuscular   Manufacturer: [place]   Lot: [id number]   [place]: [id number]

## 2019-07-22 ENCOUNTER — Other Ambulatory Visit: Payer: Self-pay | Admitting: Obstetrics & Gynecology

## 2019-07-22 DIAGNOSIS — N6320 Unspecified lump in the left breast, unspecified quadrant: Secondary | ICD-10-CM

## 2019-08-05 ENCOUNTER — Ambulatory Visit
Admission: RE | Admit: 2019-08-05 | Discharge: 2019-08-05 | Disposition: A | Discharge status: Home / Self Care | Payer: No Typology Code available for payment source | Source: Ambulatory Visit | Attending: Obstetrics & Gynecology | Admitting: Obstetrics & Gynecology

## 2019-08-05 ENCOUNTER — Other Ambulatory Visit: Payer: Self-pay

## 2019-08-05 DIAGNOSIS — N6320 Unspecified lump in the left breast, unspecified quadrant: Secondary | ICD-10-CM

## 2019-08-11 ENCOUNTER — Encounter (HOSPITAL_COMMUNITY): Payer: Self-pay

## 2019-08-11 ENCOUNTER — Ambulatory Visit (HOSPITAL_COMMUNITY)
Admission: EM | Admit: 2019-08-11 | Discharge: 2019-08-11 | Disposition: A | Discharge status: Home / Self Care | Payer: No Typology Code available for payment source | Attending: Internal Medicine | Admitting: Internal Medicine

## 2019-08-11 ENCOUNTER — Other Ambulatory Visit: Payer: Self-pay

## 2019-08-11 ENCOUNTER — Ambulatory Visit (INDEPENDENT_AMBULATORY_CARE_PROVIDER_SITE_OTHER): Payer: No Typology Code available for payment source

## 2019-08-11 DIAGNOSIS — S8982XA Other specified injuries of left lower leg, initial encounter: Secondary | ICD-10-CM

## 2019-08-11 DIAGNOSIS — M1712 Unilateral primary osteoarthritis, left knee: Secondary | ICD-10-CM | POA: Diagnosis not present

## 2019-08-11 MED ORDER — IBUPROFEN 600 MG PO TABS: 600.0000 mg | ORAL_TABLET | Freq: Four times a day (QID) | ORAL | 0 refills | Status: DC | PRN

## 2019-08-11 MED ORDER — DICLOFENAC SODIUM 1 % EX GEL: 4.0000 g | Freq: Four times a day (QID) | CUTANEOUS | Status: DC

## 2019-08-11 NOTE — ED Triage Notes (Signed)
Pt states she twisted her left leg last week and a couple days later her left knee started hurting. Pt c/o 6/10 sharp pain in left knee. Pt states knee pain is worse when she bears weight on it. PT had a slight limp to triage.

## 2019-08-11 NOTE — ED Provider Notes (Signed)
[facility]    CSN: [id number] Arrival date & time: 08/11/19  1708      History   Chief Complaint Chief Complaint  Patient presents with  . Knee Injury    HPI Marilyn [name] is a 64 y.o. female comes to the urgent care with complaints of left knee pain which started after she twisted her leg about a week ago.  Pain is sharp and worse with bearing weight.  No known relieving factors.  No swelling or erythema.Marland Kitchen   HPI  Past Medical History:  Diagnosis Date  . Allergy   . Anemia    hx  . Anxiety    panic disorder  . Breast cancer ([place]) 06/06/2011   bc  left breast 3 o'clock dx=invasive ductal ca uoqER/PR=positive  . Cancer ([place])    BREAST - left  . Cigarette smoker   . Colon polyp   . Complication of anesthesia    DIFFICULTY AWAKENING  . Depression   . Diverticulosis of colon   . DJD (degenerative joint disease)   . DJD (degenerative joint disease)   . Family history of breast cancer   . Family history of colon cancer   . Family history of pancreatic cancer   . Family history of prostate cancer   . Gallstones   . GERD (gastroesophageal reflux disease)    Pt. denies having GERD. Unable to remove it.  Marland Kitchen Headache(784.0)   . History of anemia   . History of bronchitis   . History of sarcoidosis   . Hyperlipidemia    no meds needed  . Incisional breast wound    AT AGE 27 BILATERAL INCISION TO BREAST MADE  . Leg swelling   . Panic disorder   . Personal history of radiation therapy   . S/P radiation therapy 08/17/11 - 09/28/11   LLQ - 50 Gy/25 Fractions with Boost of 10 Gy / 5 fractions  . Sialoadenitis of submandibular gland 2016  . Vitamin D deficiency     Patient Active Problem List   Diagnosis Date Noted  . Cholelithiasis 12/26/2018  . Genetic testing 12/26/2017  . Family history of breast cancer   . Family history of pancreatic cancer   . Family history of colon cancer   . Abdominal pain 07/28/2016  . Smoker 05/08/2016  . Obesity  03/13/2016  . Dyspnea 11/09/2015  . Well adult exam 07/23/2014  . Sialoadenitis 06/02/2014  . Edema 07/07/2013  . AB (asthmatic bronchitis) 06/23/2013  . Anxiety   . Malignant neoplasm of upper-outer quadrant of left breast in female, estrogen receptor positive ([place]) 06/09/2011  . COLONIC POLYPS 01/25/2010  . DIVERTICULOSIS OF COLON 01/25/2010  . DEGENERATIVE JOINT DISEASE 11/06/2008  . PANIC DISORDER 07/23/2008  . Sarcoidosis 10/13/2007  . Vitamin D deficiency 10/13/2007  . ANEMIA 10/13/2007  . CIGARETTE SMOKER 10/13/2007  . BRONCHITIS 10/13/2007  . Dyslipidemia 04/23/2007  . Adjustment disorder with mixed anxiety and depressed mood 04/23/2007  . Venous (peripheral) insufficiency 04/23/2007  . HEADACHE 04/23/2007    Past Surgical History:  Procedure Laterality Date  . BIOPSY BREAST     left breast  as a teenager  benign  . BREAST BIOPSY Left 06/06/2011  . BREAST EXCISIONAL BIOPSY Bilateral   . BREAST LUMPECTOMY Left 2013  . BREAST SURGERY  07/05/11   left breast lumpectomy with needle loc & axillary sln bx  . BUNIONECTOMY  2011   bilateral by Dr Little Ishikawa  . COLONOSCOPY     polyp  .  cyst removed from right hand  1995  . TONSILLECTOMY  1972  . TOTAL ABDOMINAL HYSTERECTOMY  2000   Dr Nori Riis  . TRIGGER FINGER RELEASE  2008   Dr Burney Gauze    OB History   No obstetric history on file.    Obstetric Comments  Menarche age 39, nulliparity, HRT x 1 year, Hysterectomy         Home Medications    Prior to Admission medications   Medication Sig Start Date End Date Taking? Authorizing Provider  albuterol (VENTOLIN HFA) 108 (90 Base) MCG/ACT inhaler Inhale 2 puffs into the lungs every 6 (six) hours as needed for wheezing or shortness of breath. 03/11/19   Biagio Borg, MD  budesonide-formoterol [facility]) 80-4.5 MCG/ACT inhaler Inhale 2 puffs into the lungs 2 (two) times daily. 04/28/19   Plotnikov, Evie Lacks, MD  busPIRone (BUSPAR) 30 MG tablet TAKE 1 /2 (ONE-HALF) TO 1 TABLET  TWICE DAILY 01/29/19   Plotnikov, Evie Lacks, MD  Cholecalciferol 5000 UNITS TABS Take 1 tablet by mouth 3 (three) times a week.    [provider]  clobetasol cream (TEMOVATE) 6.27 % Apply 1 application topically 2 (two) times daily. Patient taking differently: Apply 1 application topically 2 (two) times daily as needed.  09/13/17   Plotnikov, Evie Lacks, MD  clonazePAM (KLONOPIN) 0.5 MG tablet TAKE 1/2 TO 1 TABLET BY MOUTH TWICE A DAY AS NEEDED 09/18/18   Plotnikov, Evie Lacks, MD  diclofenac Sodium (VOLTAREN) 1 % GEL Apply 4 g topically 4 (four) times daily. 08/11/19   Guillermo Nehring, Myrene Galas, MD  ibuprofen (ADVIL) 600 MG tablet Take 1 tablet (600 mg total) by mouth every 6 (six) hours as needed. 08/11/19   LampteyMyrene Galas, MD  Multiple Vitamin (MULTIVITAMIN WITH MINERALS) TABS tablet Take 1 tablet by mouth daily.    [provider]  triamcinolone ointment (KENALOG) 0.5 % Apply 1 application topically 4 (four) times daily. 04/28/19 04/27/20  Plotnikov, Evie Lacks, MD  pantoprazole (PROTONIX) 40 MG tablet Take 1 tablet (40 mg total) by mouth daily. 04/28/19 08/11/19  Plotnikov, Evie Lacks, MD    Family History Family History  Problem Relation Age of Onset  . Anemia Mother   . Other Mother        + H Pylori  . Seizures Mother   . Prostate cancer Father        dx over 79  . Colon cancer Father 49       diagnosed 2009  . Bladder Cancer Father        dx over 64  . Breast cancer Sister        dx in her 60s; mat half sister  . Pancreatic cancer Sister 28       d. 68  . Lung cancer Sister   . Sarcoidosis Sister        mat 1/2 sister  . Multiple sclerosis Sister        #2  . Hypertension Brother        #1  . Hyperlipidemia Brother   . Prostate cancer Brother        dx under 22  . Breast cancer Cousin        pat first cousin d. 26  . Dementia Maternal Aunt   . Seizures Maternal Uncle   . Lung cancer Paternal Aunt   . Brain cancer Other        benign  . Dementia Maternal Aunt   .  Lung  cancer Maternal Uncle   . Sarcoidosis Cousin        mat first cousin  . Breast cancer Cousin        maternal 2nd cousins - distant  . Esophageal cancer Neg Hx   . Stomach cancer Neg Hx   . Rectal cancer Neg Hx     Social History Social History   Tobacco Use  . Smoking status: Current Every Day Smoker    Packs/day: 0.50    Types: Cigarettes    Last attempt to quit: 04/16/2012    Years since quitting: 7.3  . Smokeless tobacco: Never Used  Substance Use Topics  . Alcohol use: Yes    Alcohol/week: 0.0 - 1.0 standard drinks    Comment: social  . Drug use: No     Allergies   Lexapro [escitalopram oxalate] and Penicillins   Review of Systems Review of Systems  Constitutional: Negative for activity change, fatigue and fever.  Respiratory: Negative.   Gastrointestinal: Negative.   Genitourinary: Negative.   Musculoskeletal: Positive for arthralgias. Negative for joint swelling and myalgias.  Skin: Negative.   Neurological: Negative.      Physical Exam Triage Vital Signs ED Triage Vitals  Enc Vitals Group     BP 08/11/19 1726 (!) 146/72     Pulse Rate 08/11/19 1726 81     Resp 08/11/19 1726 16     Temp 08/11/19 1726 98.8 F (37.1 C)     Temp Source 08/11/19 1726 Oral     SpO2 08/11/19 1726 99 %     Weight 08/11/19 1730 190 lb (86.2 kg)     Height 08/11/19 1730 5\' 5"  (1.651 m)     Head Circumference --      Peak Flow --      Pain Score 08/11/19 1729 6     Pain Loc --      Pain Edu? --      Excl. in [place]? --    No data found.  Updated Vital Signs BP (!) 146/72   Pulse 81   Temp 98.8 F (37.1 C) (Oral)   Resp 16   Ht 5\' 5"  (1.651 m)   Wt 86.2 kg   SpO2 99%   BMI 31.62 kg/m   Visual Acuity Right Eye Distance:   Left Eye Distance:   Bilateral Distance:    Right Eye Near:   Left Eye Near:    Bilateral Near:     Physical Exam Cardiovascular:     Pulses: Normal pulses.     Heart sounds: Normal heart sounds.  Pulmonary:     Effort: Pulmonary  effort is normal.     Breath sounds: Normal breath sounds.  Abdominal:     General: Bowel sounds are normal.     Palpations: Abdomen is soft.  Musculoskeletal:     Comments: Anterior and posterior drawer test are negative.  Skin:    General: Skin is warm.     Capillary Refill: Capillary refill takes less than 2 seconds.  Neurological:     General: No focal deficit present.      UC Treatments / Results  Labs (all labs ordered are listed, but only abnormal results are displayed) Labs Reviewed - No data to display  EKG   Radiology No results found.  Procedures Procedures (including critical care time)  Medications Ordered in UC Medications - No data to display  Initial Impression / Assessment and Plan / UC Course  I have reviewed the triage vital  signs and the nursing notes.  Pertinent labs & imaging results that were available during my care of the patient were reviewed by me and considered in my medical decision making (see chart for details).     1.  Left knee pain secondary to osteoarthritis: Ibuprofen 600 mg every 6 hours as needed for pain Voltaren gel to be applied 4 times daily as needed Gentle range of motion exercises If symptoms worsen patient may need a knee brace Range of motion exercises encouraged Return precautions given. Final Clinical Impressions(s) / UC Diagnoses   Final diagnoses:  Primary osteoarthritis of left knee   Discharge Instructions   None    ED Prescriptions    Medication Sig Dispense Auth. Provider   ibuprofen (ADVIL) 600 MG tablet Take 1 tablet (600 mg total) by mouth every 6 (six) hours as needed. 30 tablet Milla Wahlberg, Myrene Galas, MD   diclofenac Sodium (VOLTAREN) 1 % GEL Apply 4 g topically 4 (four) times daily.  Kalimah Capurro, Myrene Galas, MD     PDMP not reviewed this encounter.   Chase Picket, MD 08/20/19 2019

## 2019-08-28 ENCOUNTER — Encounter: Payer: Self-pay | Admitting: Internal Medicine

## 2019-08-28 ENCOUNTER — Other Ambulatory Visit: Payer: Self-pay

## 2019-08-28 ENCOUNTER — Ambulatory Visit: Payer: No Typology Code available for payment source | Admitting: Internal Medicine

## 2019-08-28 DIAGNOSIS — M25562 Pain in left knee: Secondary | ICD-10-CM

## 2019-08-28 DIAGNOSIS — F419 Anxiety disorder, unspecified: Secondary | ICD-10-CM | POA: Diagnosis not present

## 2019-08-28 DIAGNOSIS — E785 Hyperlipidemia, unspecified: Secondary | ICD-10-CM

## 2019-08-28 DIAGNOSIS — G8929 Other chronic pain: Secondary | ICD-10-CM

## 2019-08-28 DIAGNOSIS — L84 Corns and callosities: Secondary | ICD-10-CM

## 2019-08-28 DIAGNOSIS — R030 Elevated blood-pressure reading, without diagnosis of hypertension: Secondary | ICD-10-CM | POA: Diagnosis not present

## 2019-08-28 LAB — BASIC METABOLIC PANEL
BUN: 14 mg/dL (ref 6–23)
CO2: 28 mEq/L (ref 19–32)
Calcium: 10 mg/dL (ref 8.4–10.5)
Chloride: 105 mEq/L (ref 96–112)
Creatinine, Ser: 0.77 mg/dL (ref 0.40–1.20)
GFR: 90.89 mL/min (ref >60.00)
Glucose, Bld: 93 mg/dL (ref 70–99)
Potassium: 4.2 mEq/L (ref 3.5–5.1)
Sodium: 138 mEq/L (ref 135–145)

## 2019-08-28 MED ORDER — CELECOXIB 200 MG PO CAPS: 200.0000 mg | ORAL_CAPSULE | Freq: Two times a day (BID) | ORAL | 3 refills | Status: DC

## 2019-08-28 MED ORDER — CELECOXIB 200 MG PO CAPS: 200.0000 mg | ORAL_CAPSULE | Freq: Every day | ORAL | 3 refills | Status: DC | PRN

## 2019-08-28 NOTE — Assessment & Plan Note (Signed)
Not on statins 

## 2019-08-28 NOTE — Assessment & Plan Note (Signed)
Pain See procedure

## 2019-08-28 NOTE — Addendum Note (Signed)
Addended by: Trenda Moots on: 0/02/6427 10:38 AM   Modules accepted: Orders

## 2019-08-28 NOTE — Assessment & Plan Note (Signed)
OA Celebrex D/c Ibuprofen

## 2019-08-28 NOTE — Assessment & Plan Note (Signed)
D/c Ibuprofen NAS diet Monitor BP

## 2019-08-28 NOTE — Progress Notes (Signed)
Subjective:  Patient ID: Marilyn [name], female    DOB: 12-12-1955  Age: 64 y.o. MRN: [id number]  CC: No chief complaint on file.   HPI Marilyn [name] presents for COPD, anxiety, vit D def f/u C/o stress at home - taking care of her parents C/o L foot pain  Outpatient Medications Prior to Visit  Medication Sig Dispense Refill   albuterol (VENTOLIN HFA) 108 (90 Base) MCG/ACT inhaler Inhale 2 puffs into the lungs every 6 (six) hours as needed for wheezing or shortness of breath. 18 g 11   budesonide-formoterol (SYMBICORT) 80-4.5 MCG/ACT inhaler Inhale 2 puffs into the lungs 2 (two) times daily. 1 Inhaler 11   busPIRone (BUSPAR) 30 MG tablet TAKE 1 /2 (ONE-HALF) TO 1 TABLET TWICE DAILY 60 tablet 5   Cholecalciferol 5000 UNITS TABS Take 1 tablet by mouth 3 (three) times a week.     clobetasol cream (TEMOVATE) 6.96 % Apply 1 application topically 2 (two) times daily. (Patient taking differently: Apply 1 application topically 2 (two) times daily as needed. ) 60 g 3   clonazePAM (KLONOPIN) 0.5 MG tablet TAKE 1/2 TO 1 TABLET BY MOUTH TWICE A DAY AS NEEDED 60 tablet 2   diclofenac Sodium (VOLTAREN) 1 % GEL Apply 4 g topically 4 (four) times daily.     ibuprofen (ADVIL) 600 MG tablet Take 1 tablet (600 mg total) by mouth every 6 (six) hours as needed. 30 tablet 0   Multiple Vitamin (MULTIVITAMIN WITH MINERALS) TABS tablet Take 1 tablet by mouth daily.     triamcinolone ointment (KENALOG) 0.5 % Apply 1 application topically 4 (four) times daily. 30 g 1   No facility-administered medications prior to visit.    ROS: Review of Systems  Constitutional: Negative for activity change, appetite change, chills, fatigue and unexpected weight change.  HENT: Positive for congestion. Negative for mouth sores and sinus pressure.   Eyes: Negative for visual disturbance.  Respiratory: Negative for cough and chest tightness.   Gastrointestinal: Negative for abdominal pain and  nausea.  Genitourinary: Negative for difficulty urinating, frequency and vaginal pain.  Musculoskeletal: Negative for back pain and gait problem.  Skin: Negative for pallor and rash.  Neurological: Negative for dizziness, tremors, weakness, numbness and headaches.  Psychiatric/Behavioral: Negative for confusion and sleep disturbance. The patient is nervous/anxious.     Objective:  BP (!) 150/84 (BP Location: Right Arm, Patient Position: Sitting, Cuff Size: Normal)    Pulse 79    Temp 98.6 F (37 C) (Oral)    Ht 5\' 5"  (1.651 m)    Wt 191 lb (86.6 kg)    SpO2 97%    BMI 31.78 kg/m   BP Readings from Last 3 Encounters:  08/28/19 (!) 150/84  08/11/19 (!) 146/72  04/28/19 132/80    Wt Readings from Last 3 Encounters:  08/28/19 191 lb (86.6 kg)  08/11/19 190 lb (86.2 kg)  04/28/19 189 lb (85.7 kg)    Physical Exam Constitutional:      General: She is not in acute distress.    Appearance: She is well-developed. She is obese.  HENT:     Head: Normocephalic.     Right Ear: External ear normal.     Left Ear: External ear normal.     Nose: Nose normal.  Eyes:     General:        Right eye: No discharge.        Left eye: No discharge.     Conjunctiva/sclera:  Conjunctivae normal.     Pupils: Pupils are equal, round, and reactive to light.  Neck:     Thyroid: No thyromegaly.     Vascular: No JVD.     Trachea: No tracheal deviation.  Cardiovascular:     Rate and Rhythm: Normal rate and regular rhythm.     Heart sounds: Normal heart sounds.  Pulmonary:     Effort: No respiratory distress.     Breath sounds: No stridor. No wheezing.  Abdominal:     General: Bowel sounds are normal. There is no distension.     Palpations: Abdomen is soft. There is no mass.     Tenderness: There is no abdominal tenderness. There is no guarding or rebound.  Musculoskeletal:        General: No tenderness.     Cervical back: Normal range of motion and neck supple.  Lymphadenopathy:     Cervical:  No cervical adenopathy.  Skin:    Findings: No erythema or rash.  Neurological:     Cranial Nerves: No cranial nerve deficit.     Motor: No abnormal muscle tone.     Coordination: Coordination normal.     Deep Tendon Reflexes: Reflexes normal.  Psychiatric:        Behavior: Behavior normal.        Thought Content: Thought content normal.        Judgment: Judgment normal.   L foot callus  Procedure:   L foot callus paring/cutting  Indication:  L  foot callus, painful  Consent: verbal  Risks and benefits were explained to the patient. Skin was cleaned with alcohol. I removed a large callus carefully with a round blade. Skin remained intact. Pain is better. Tolerated well. Complications: none. Bandaid applied    Lab Results  Component Value Date   WBC 7.9 11/12/2018   HGB 12.9 11/12/2018   HCT 39.8 11/12/2018   PLT 194.0 11/12/2018   GLUCOSE 96 11/12/2018   CHOL 226 (H) 09/18/2018   TRIG 103.0 09/18/2018   HDL 72.90 09/18/2018   LDLDIRECT 118.9 12/18/2011   LDLCALC 132 (H) 09/18/2018   ALT 10 11/12/2018   AST 11 11/12/2018   NA 139 11/12/2018   K 4.0 11/12/2018   CL 106 11/12/2018   CREATININE 0.80 11/12/2018   BUN 16 11/12/2018   CO2 27 11/12/2018   TSH 1.33 09/18/2018    DG Knee Complete 4 Views Left  Result Date: 08/11/2019 CLINICAL DATA:  Left knee twisting injury EXAM: LEFT KNEE - COMPLETE 4+ VIEW COMPARISON:  None. FINDINGS: Degenerative changes with joint space narrowing and spurring in the medial and patellofemoral compartments. No joint effusion. No acute bony abnormality. Specifically, no fracture, subluxation, or dislocation. IMPRESSION: Mild degenerative changes as above.  No acute bony abnormality. Electronically Signed   By: Rolm Baptise M.D.   On: 08/11/2019 19:07    Assessment & Plan:   There are no diagnoses linked to this encounter.   No orders of the defined types were placed in this encounter.    Follow-up: No follow-ups on file.  Walker Kehr, MD

## 2019-08-28 NOTE — Assessment & Plan Note (Signed)
On Buspar Clonazepam prn

## 2019-09-17 ENCOUNTER — Telehealth: Payer: Self-pay | Admitting: Adult Health

## 2019-09-17 NOTE — Telephone Encounter (Signed)
Rescheduled appointment per 7/21 message. Patient is aware of updated appointment time and date.

## 2019-09-18 ENCOUNTER — Ambulatory Visit (HOSPITAL_COMMUNITY)
Admission: EM | Admit: 2019-09-18 | Discharge: 2019-09-18 | Disposition: A | Discharge status: Home / Self Care | Payer: No Typology Code available for payment source | Attending: Family Medicine | Admitting: Family Medicine

## 2019-09-18 ENCOUNTER — Other Ambulatory Visit: Payer: Self-pay

## 2019-09-18 ENCOUNTER — Ambulatory Visit (INDEPENDENT_AMBULATORY_CARE_PROVIDER_SITE_OTHER): Payer: No Typology Code available for payment source

## 2019-09-18 ENCOUNTER — Encounter (HOSPITAL_COMMUNITY): Payer: Self-pay

## 2019-09-18 DIAGNOSIS — M25562 Pain in left knee: Secondary | ICD-10-CM | POA: Insufficient documentation

## 2019-09-18 DIAGNOSIS — I700 Atherosclerosis of aorta: Secondary | ICD-10-CM | POA: Insufficient documentation

## 2019-09-18 DIAGNOSIS — R509 Fever, unspecified: Secondary | ICD-10-CM | POA: Diagnosis not present

## 2019-09-18 DIAGNOSIS — Z20822 Contact with and (suspected) exposure to covid-19: Secondary | ICD-10-CM | POA: Insufficient documentation

## 2019-09-18 DIAGNOSIS — F419 Anxiety disorder, unspecified: Secondary | ICD-10-CM | POA: Diagnosis not present

## 2019-09-18 DIAGNOSIS — Z8709 Personal history of other diseases of the respiratory system: Secondary | ICD-10-CM | POA: Diagnosis not present

## 2019-09-18 DIAGNOSIS — Z79899 Other long term (current) drug therapy: Secondary | ICD-10-CM | POA: Insufficient documentation

## 2019-09-18 DIAGNOSIS — D869 Sarcoidosis, unspecified: Secondary | ICD-10-CM | POA: Diagnosis not present

## 2019-09-18 DIAGNOSIS — K219 Gastro-esophageal reflux disease without esophagitis: Secondary | ICD-10-CM | POA: Insufficient documentation

## 2019-09-18 DIAGNOSIS — R197 Diarrhea, unspecified: Secondary | ICD-10-CM | POA: Diagnosis not present

## 2019-09-18 DIAGNOSIS — J069 Acute upper respiratory infection, unspecified: Secondary | ICD-10-CM | POA: Diagnosis not present

## 2019-09-18 DIAGNOSIS — Z791 Long term (current) use of non-steroidal anti-inflammatories (NSAID): Secondary | ICD-10-CM | POA: Insufficient documentation

## 2019-09-18 DIAGNOSIS — R05 Cough: Secondary | ICD-10-CM | POA: Diagnosis not present

## 2019-09-18 DIAGNOSIS — J449 Chronic obstructive pulmonary disease, unspecified: Secondary | ICD-10-CM | POA: Insufficient documentation

## 2019-09-18 DIAGNOSIS — J029 Acute pharyngitis, unspecified: Secondary | ICD-10-CM | POA: Diagnosis not present

## 2019-09-18 DIAGNOSIS — F17210 Nicotine dependence, cigarettes, uncomplicated: Secondary | ICD-10-CM | POA: Insufficient documentation

## 2019-09-18 DIAGNOSIS — R0602 Shortness of breath: Secondary | ICD-10-CM

## 2019-09-18 LAB — SARS CORONAVIRUS 2 (TAT 6-24 HRS): SARS Coronavirus 2: NEGATIVE

## 2019-09-18 NOTE — Discharge Instructions (Addendum)
Your x-ray did not show any pneumonia This is most likely some sort of viral infection.  You can take over-the-counter medicines, recommend Mucinex for cough, congestion. Make sure you are drinking plenty of water. Tylenol for pain, fevers as needed.  Albuterol inhaler as needed. Follow up as needed for continued or worsening symptoms

## 2019-09-18 NOTE — ED Provider Notes (Signed)
[place]    CSN: [id number] Arrival date & time: 09/18/19  4098      History   Chief Complaint Chief Complaint  Patient presents with  . URI    HPI Marilyn [name] is a 64 y.o. female.   Patient is a 64 year old female with past medical history of allergy, anemia, anxiety, breast cancer, cigarette smoker, gallstones, GERD, headache, chronic bronchitis.  She presents today with approximately 2 to 3 days of productive cough with yellow mucus, chills, sore throat, headache, body aches, nasal congestion with rhinorrhea.  She is also had some intermittent diarrhea.  Symptoms have been constant.  Fever initially at 102 on first day but that has subsided.  Husband was also sick prior to this with viral type symptoms but not as severe.  Has been fully Vaccinated.  ROS per HPI      Past Medical History:  Diagnosis Date  . Allergy   . Anemia    hx  . Anxiety    panic disorder  . Breast cancer ([place]) 06/06/2011   bc  left breast 3 o'clock dx=invasive ductal ca uoqER/PR=positive  . Cancer ([place])    BREAST - left  . Cigarette smoker   . Colon polyp   . Complication of anesthesia    DIFFICULTY AWAKENING  . Depression   . Diverticulosis of colon   . DJD (degenerative joint disease)   . DJD (degenerative joint disease)   . Family history of breast cancer   . Family history of colon cancer   . Family history of pancreatic cancer   . Family history of prostate cancer   . Gallstones   . GERD (gastroesophageal reflux disease)    Pt. denies having GERD. Unable to remove it.  Marland Kitchen Headache(784.0)   . History of anemia   . History of bronchitis   . History of sarcoidosis   . Hyperlipidemia    no meds needed  . Incisional breast wound    AT AGE 40 BILATERAL INCISION TO BREAST MADE  . Leg swelling   . Panic disorder   . Personal history of radiation therapy   . S/P radiation therapy 08/17/11 - 09/28/11   LLQ - 50 Gy/25 Fractions with Boost of 10 Gy / 5 fractions    . Sialoadenitis of submandibular gland 2016  . Vitamin D deficiency     Patient Active Problem List   Diagnosis Date Noted  . Knee pain, left 08/28/2019  . Elevated BP without diagnosis of hypertension 08/28/2019  . Callus of foot 08/28/2019  . Cholelithiasis 12/26/2018  . Genetic testing 12/26/2017  . Family history of breast cancer   . Family history of pancreatic cancer   . Family history of colon cancer   . Abdominal pain 07/28/2016  . Smoker 05/08/2016  . Obesity 03/13/2016  . Dyspnea 11/09/2015  . Well adult exam 07/23/2014  . Sialoadenitis 06/02/2014  . Edema 07/07/2013  . AB (asthmatic bronchitis) 06/23/2013  . Anxiety   . Malignant neoplasm of upper-outer quadrant of left breast in female, estrogen receptor positive ([place]) 06/09/2011  . COLONIC POLYPS 01/25/2010  . DIVERTICULOSIS OF COLON 01/25/2010  . DEGENERATIVE JOINT DISEASE 11/06/2008  . PANIC DISORDER 07/23/2008  . Sarcoidosis 10/13/2007  . Vitamin D deficiency 10/13/2007  . ANEMIA 10/13/2007  . CIGARETTE SMOKER 10/13/2007  . BRONCHITIS 10/13/2007  . Dyslipidemia 04/23/2007  . Adjustment disorder with mixed anxiety and depressed mood 04/23/2007  . Venous (peripheral) insufficiency 04/23/2007  . HEADACHE 04/23/2007  Past Surgical History:  Procedure Laterality Date  . BIOPSY BREAST     left breast  as a teenager  benign  . BREAST BIOPSY Left 06/06/2011  . BREAST EXCISIONAL BIOPSY Bilateral   . BREAST LUMPECTOMY Left 2013  . BREAST SURGERY  07/05/11   left breast lumpectomy with needle loc & axillary sln bx  . BUNIONECTOMY  2011   bilateral by Dr Little Ishikawa  . COLONOSCOPY     polyp  . cyst removed from right hand  1995  . TONSILLECTOMY  1972  . TOTAL ABDOMINAL HYSTERECTOMY  2000   Dr Nori Riis  . TRIGGER FINGER RELEASE  2008   Dr Burney Gauze    OB History   No obstetric history on file.    Obstetric Comments  Menarche age 10, nulliparity, HRT x 1 year, Hysterectomy         Home Medications     Prior to Admission medications   Medication Sig Start Date End Date Taking? Authorizing Provider  albuterol (VENTOLIN HFA) 108 (90 Base) MCG/ACT inhaler Inhale 2 puffs into the lungs every 6 (six) hours as needed for wheezing or shortness of breath. 03/11/19   Biagio Borg, MD  budesonide-formoterol [facility]) 80-4.5 MCG/ACT inhaler Inhale 2 puffs into the lungs 2 (two) times daily. 04/28/19   Plotnikov, Evie Lacks, MD  busPIRone (BUSPAR) 30 MG tablet TAKE 1 /2 (ONE-HALF) TO 1 TABLET TWICE DAILY 01/29/19   Plotnikov, Evie Lacks, MD  celecoxib (CELEBREX) 200 MG capsule Take 1 capsule (200 mg total) by mouth daily as needed for moderate pain. 08/28/19   Plotnikov, Evie Lacks, MD  Cholecalciferol 5000 UNITS TABS Take 1 tablet by mouth 3 (three) times a week.    [provider]  clobetasol cream (TEMOVATE) 7.56 % Apply 1 application topically 2 (two) times daily. Patient taking differently: Apply 1 application topically 2 (two) times daily as needed.  09/13/17   Plotnikov, Evie Lacks, MD  clonazePAM (KLONOPIN) 0.5 MG tablet TAKE 1/2 TO 1 TABLET BY MOUTH TWICE A DAY AS NEEDED 09/18/18   Plotnikov, Evie Lacks, MD  diclofenac Sodium (VOLTAREN) 1 % GEL Apply 4 g topically 4 (four) times daily. 08/11/19   LampteyMyrene Galas, MD  Multiple Vitamin (MULTIVITAMIN WITH MINERALS) TABS tablet Take 1 tablet by mouth daily.    [provider]  triamcinolone ointment (KENALOG) 0.5 % Apply 1 application topically 4 (four) times daily. 04/28/19 04/27/20  Plotnikov, Evie Lacks, MD  pantoprazole (PROTONIX) 40 MG tablet Take 1 tablet (40 mg total) by mouth daily. 04/28/19 08/11/19  Plotnikov, Evie Lacks, MD    Family History Family History  Problem Relation Age of Onset  . Anemia Mother   . Other Mother        + H Pylori  . Seizures Mother   . Prostate cancer Father        dx over 87  . Colon cancer Father 80       diagnosed 2009  . Bladder Cancer Father        dx over 45  . Breast cancer Sister        dx in  her 23s; mat half sister  . Pancreatic cancer Sister 71       d. 31  . Lung cancer Sister   . Sarcoidosis Sister        mat 1/2 sister  . Multiple sclerosis Sister        #2  . Hypertension Brother        #  1  . Hyperlipidemia Brother   . Prostate cancer Brother        dx under 75  . Breast cancer Cousin        pat first cousin d. 65  . Dementia Maternal Aunt   . Seizures Maternal Uncle   . Lung cancer Paternal Aunt   . Brain cancer Other        benign  . Dementia Maternal Aunt   . Lung cancer Maternal Uncle   . Sarcoidosis Cousin        mat first cousin  . Breast cancer Cousin        maternal 2nd cousins - distant  . Esophageal cancer Neg Hx   . Stomach cancer Neg Hx   . Rectal cancer Neg Hx     Social History Social History   Tobacco Use  . Smoking status: Current Every Day Smoker    Packs/day: 0.50    Types: Cigarettes    Last attempt to quit: 04/16/2012    Years since quitting: 7.4  . Smokeless tobacco: Never Used  Substance Use Topics  . Alcohol use: Yes    Alcohol/week: 0.0 - 1.0 standard drinks    Comment: social  . Drug use: No     Allergies   Lexapro [escitalopram oxalate] and Penicillins   Review of Systems Review of Systems   Physical Exam Triage Vital Signs ED Triage Vitals  Enc Vitals Group     BP 09/18/19 1052 (!) 138/88     Pulse Rate 09/18/19 1052 73     Resp 09/18/19 1052 17     Temp 09/18/19 1052 98.8 F (37.1 C)     Temp Source 09/18/19 1052 Oral     SpO2 09/18/19 1052 100 %     Weight --      Height --      Head Circumference --      Peak Flow --      Pain Score 09/18/19 1054 5     Pain Loc --      Pain Edu? --      Excl. in [place]? --    No data found.  Updated Vital Signs BP (!) 138/88 (BP Location: Right Arm)   Pulse 73   Temp 98.8 F (37.1 C) (Oral)   Resp 17   SpO2 100%   Visual Acuity Right Eye Distance:   Left Eye Distance:   Bilateral Distance:    Right Eye Near:   Left Eye Near:    Bilateral Near:      Physical Exam Vitals and nursing note reviewed.  Constitutional:      General: She is not in acute distress.    Appearance: Normal appearance. She is not ill-appearing, toxic-appearing or diaphoretic.  HENT:     Head: Normocephalic.     Right Ear: Tympanic membrane and ear canal normal.     Left Ear: Tympanic membrane and ear canal normal.     Nose: Nose normal.     Mouth/Throat:     Pharynx: Posterior oropharyngeal erythema present.  Eyes:     Conjunctiva/sclera: Conjunctivae normal.  Cardiovascular:     Rate and Rhythm: Normal rate and regular rhythm.     Pulses: Normal pulses.     Heart sounds: Normal heart sounds.  Pulmonary:     Effort: Pulmonary effort is normal.     Comments: Decreased breath sounds throughout lungs with expiratory wheezing Musculoskeletal:        General: Normal range  of motion.     Cervical back: Normal range of motion.  Skin:    General: Skin is warm and dry.     Findings: No rash.  Neurological:     Mental Status: She is alert.  Psychiatric:        Mood and Affect: Mood normal.      UC Treatments / Results  Labs (all labs ordered are listed, but only abnormal results are displayed) Labs Reviewed  SARS CORONAVIRUS 2 (TAT 6-24 HRS)    EKG   Radiology DG Chest 2 View  Result Date: 09/18/2019 CLINICAL DATA:  Cough, shortness of breath, and fever. History of COPD and sarcoidosis. EXAM: CHEST - 2 VIEW COMPARISON:  03/12/2019 FINDINGS: The cardiomediastinal silhouette is unchanged. Aortic atherosclerosis is noted. The lungs remain hyperinflated with similar appearance of mild chronic interstitial coarsening. No airspace consolidation, edema, pleural effusion, pneumothorax is identified. Surgical clips project over the left breast. No acute osseous abnormality is seen. IMPRESSION: No active cardiopulmonary disease. Electronically Signed   By: Logan Bores M.D.   On: 09/18/2019 11:43    Procedures Procedures (including critical care  time)  Medications Ordered in UC Medications - No data to display  Initial Impression / Assessment and Plan / UC Course  I have reviewed the triage vital signs and the nursing notes.  Pertinent labs & imaging results that were available during my care of the patient were reviewed by me and considered in my medical decision making (see chart for details).     Viral URI with cough X-ray without any concerns for pneumonia today. Consistent with COPD Recommended Mucinex for cough and congestion. Tylenol for pain and fevers as needed.  Albuterol inhaler as needed. covid swab pending.  Follow up as needed for continued or worsening symptoms  Final Clinical Impressions(s) / UC Diagnoses   Final diagnoses:  Viral URI with cough     Discharge Instructions     Your x-ray did not show any pneumonia This is most likely some sort of viral infection.  You can take over-the-counter medicines, recommend Mucinex for cough, congestion. Make sure you are drinking plenty of water. Tylenol for pain, fevers as needed.  Albuterol inhaler as needed. Follow up as needed for continued or worsening symptoms     ED Prescriptions    None     PDMP not reviewed this encounter.   Loura Halt A, NP 09/18/19 [phone]

## 2019-09-18 NOTE — ED Triage Notes (Signed)
Pt presents with productive cough with yellow mucus, chills, sore throat, headache, nasal drainage, and intermittent diarrhea X 2 days.

## 2019-09-24 ENCOUNTER — Telehealth: Payer: Self-pay

## 2019-09-24 NOTE — Telephone Encounter (Signed)
New message   Martin Majestic to urgent care last Wednesday was diagnosis with Upper Resp infection asking for a call back from the [facility] to discuss.

## 2019-09-25 NOTE — Telephone Encounter (Signed)
Spoke with pt that states she went to urgent care and was told to take over the counter mucinex and that her mucus is changing for green to yellow. Advised pt that if it gets really bad to go to ED. Scheduled virtual visit with Dr. Alain Marion on 8/3 at 8:50 a

## 2019-09-30 ENCOUNTER — Telehealth (INDEPENDENT_AMBULATORY_CARE_PROVIDER_SITE_OTHER): Payer: No Typology Code available for payment source | Admitting: Internal Medicine

## 2019-09-30 ENCOUNTER — Encounter: Payer: Self-pay | Admitting: Internal Medicine

## 2019-09-30 DIAGNOSIS — J40 Bronchitis, not specified as acute or chronic: Secondary | ICD-10-CM | POA: Diagnosis not present

## 2019-09-30 DIAGNOSIS — F17200 Nicotine dependence, unspecified, uncomplicated: Secondary | ICD-10-CM | POA: Diagnosis not present

## 2019-09-30 DIAGNOSIS — J449 Chronic obstructive pulmonary disease, unspecified: Secondary | ICD-10-CM | POA: Insufficient documentation

## 2019-09-30 DIAGNOSIS — J441 Chronic obstructive pulmonary disease with (acute) exacerbation: Secondary | ICD-10-CM

## 2019-09-30 MED ORDER — METHYLPREDNISOLONE 4 MG PO TBPK: ORAL_TABLET | ORAL | 0 refills | Status: DC

## 2019-09-30 MED ORDER — PROMETHAZINE-CODEINE 6.25-10 MG/5ML PO SYRP: 5.0000 mL | ORAL_SOLUTION | ORAL | 0 refills | Status: DC | PRN

## 2019-09-30 MED ORDER — DOXYCYCLINE HYCLATE 100 MG PO TABS: 100.0000 mg | ORAL_TABLET | Freq: Two times a day (BID) | ORAL | 0 refills | Status: DC

## 2019-09-30 NOTE — Assessment & Plan Note (Signed)
Doxy x 10 d 

## 2019-09-30 NOTE — Progress Notes (Signed)
Virtual Visit via Video Note  I connected with Marilyn [name] on 09/30/19 at  8:50 AM EDT by a video enabled telemedicine application and verified that I am speaking with the correct person using two identifiers.   I discussed the limitations of evaluation and management by telemedicine and the availability of in person appointments. The patient expressed understanding and agreed to proceed.  I was located at our [facility] office. The patient was at home. There was no one else present in the visit.   History of Present Illness: We need to follow-up on URI x 2 wks - not better  C/o ST,  runny nose, cough, chest pain, shortness of breath. No abdominal pain, diarrhea, constipation, arthralgias, skin rashes.   Observations/Objective: The patient appears to be in no acute distress, looks tired  Assessment and Plan:  See my Assessment and Plan. Follow Up Instructions:    I discussed the assessment and treatment plan with the patient. The patient was provided an opportunity to ask questions and all were answered. The patient agreed with the plan and demonstrated an understanding of the instructions.   The patient was advised to call back or seek an in-person evaluation if the symptoms worsen or if the condition fails to improve as anticipated.  I provided face-to-face time during this encounter. We were at different locations.   Walker Kehr, MD

## 2019-09-30 NOTE — Assessment & Plan Note (Signed)
Acknowledged.

## 2019-09-30 NOTE — Assessment & Plan Note (Signed)
Medrol pack Prom-cod syr Rx Cont inhalers

## 2019-10-20 LAB — HM DIABETES EYE EXAM

## 2019-10-24 ENCOUNTER — Encounter: Payer: Self-pay | Admitting: Internal Medicine

## 2019-12-01 ENCOUNTER — Encounter: Payer: No Typology Code available for payment source | Admitting: Internal Medicine

## 2019-12-12 ENCOUNTER — Encounter: Payer: No Typology Code available for payment source | Admitting: Adult Health

## 2019-12-22 ENCOUNTER — Encounter: Payer: No Typology Code available for payment source | Admitting: Adult Health

## 2019-12-25 ENCOUNTER — Encounter: Payer: Self-pay | Admitting: Internal Medicine

## 2019-12-25 ENCOUNTER — Ambulatory Visit (INDEPENDENT_AMBULATORY_CARE_PROVIDER_SITE_OTHER): Payer: No Typology Code available for payment source | Admitting: Internal Medicine

## 2019-12-25 ENCOUNTER — Other Ambulatory Visit: Payer: Self-pay

## 2019-12-25 VITALS — BP 142/82 | HR 73 | Temp 98.4°F | Wt 192.8 lb

## 2019-12-25 DIAGNOSIS — D649 Anemia, unspecified: Secondary | ICD-10-CM

## 2019-12-25 DIAGNOSIS — E559 Vitamin D deficiency, unspecified: Secondary | ICD-10-CM

## 2019-12-25 DIAGNOSIS — Z0000 Encounter for general adult medical examination without abnormal findings: Secondary | ICD-10-CM

## 2019-12-25 DIAGNOSIS — J4520 Mild intermittent asthma, uncomplicated: Secondary | ICD-10-CM

## 2019-12-25 DIAGNOSIS — Z23 Encounter for immunization: Secondary | ICD-10-CM | POA: Diagnosis not present

## 2019-12-25 DIAGNOSIS — D126 Benign neoplasm of colon, unspecified: Secondary | ICD-10-CM | POA: Diagnosis not present

## 2019-12-25 DIAGNOSIS — F419 Anxiety disorder, unspecified: Secondary | ICD-10-CM

## 2019-12-25 DIAGNOSIS — E785 Hyperlipidemia, unspecified: Secondary | ICD-10-CM

## 2019-12-25 LAB — URINALYSIS
Bilirubin Urine: NEGATIVE
Ketones, ur: NEGATIVE
Leukocytes,Ua: NEGATIVE
Nitrite: NEGATIVE
Specific Gravity, Urine: 1.015 (ref 1.000–1.030)
Total Protein, Urine: NEGATIVE
Urine Glucose: NEGATIVE
Urobilinogen, UA: 0.2 (ref 0.0–1.0)
pH: 7 (ref 5.0–8.0)

## 2019-12-25 LAB — CBC WITH DIFFERENTIAL/PLATELET
Basophils Absolute: 0 10*3/uL (ref 0.0–0.1)
Basophils Relative: 0.5 % (ref 0.0–3.0)
Eosinophils Absolute: 0.2 10*3/uL (ref 0.0–0.7)
Eosinophils Relative: 2.3 % (ref 0.0–5.0)
HCT: 37.5 % (ref 36.0–46.0)
Hemoglobin: 11.9 g/dL — ABNORMAL LOW (ref 12.0–15.0)
Lymphocytes Relative: 30.4 % (ref 12.0–46.0)
Lymphs Abs: 2.5 10*3/uL (ref 0.7–4.0)
MCHC: 31.6 g/dL (ref 30.0–36.0)
MCV: 75.6 fl — ABNORMAL LOW (ref 78.0–100.0)
Monocytes Absolute: 0.6 10*3/uL (ref 0.1–1.0)
Monocytes Relative: 6.9 % (ref 3.0–12.0)
Neutro Abs: 4.9 10*3/uL (ref 1.4–7.7)
Neutrophils Relative %: 59.9 % (ref 43.0–77.0)
Platelets: 198 10*3/uL (ref 150.0–400.0)
RBC: 4.96 Mil/uL (ref 3.87–5.11)
RDW: 14.8 % (ref 11.5–15.5)
WBC: 8.2 10*3/uL (ref 4.0–10.5)

## 2019-12-25 LAB — LIPID PANEL
Cholesterol: 214 mg/dL — ABNORMAL HIGH (ref 0–200)
HDL: 80.3 mg/dL (ref >39.00)
LDL Cholesterol: 119 mg/dL — ABNORMAL HIGH (ref 0–99)
NonHDL: 133.52
Total CHOL/HDL Ratio: 3
Triglycerides: 72 mg/dL (ref 0.0–149.0)
VLDL: 14.4 mg/dL (ref 0.0–40.0)

## 2019-12-25 LAB — COMPREHENSIVE METABOLIC PANEL
ALT: 10 U/L (ref 0–35)
AST: 12 U/L (ref 0–37)
Albumin: 4.1 g/dL (ref 3.5–5.2)
Alkaline Phosphatase: 79 U/L (ref 39–117)
BUN: 11 mg/dL (ref 6–23)
CO2: 30 mEq/L (ref 19–32)
Calcium: 9.8 mg/dL (ref 8.4–10.5)
Chloride: 105 mEq/L (ref 96–112)
Creatinine, Ser: 0.74 mg/dL (ref 0.40–1.20)
GFR: 84.67 mL/min (ref >60.00)
Glucose, Bld: 88 mg/dL (ref 70–99)
Potassium: 4.2 mEq/L (ref 3.5–5.1)
Sodium: 140 mEq/L (ref 135–145)
Total Bilirubin: 0.4 mg/dL (ref 0.2–1.2)
Total Protein: 6.9 g/dL (ref 6.0–8.3)

## 2019-12-25 LAB — TSH: TSH: 1.43 u[IU]/mL (ref 0.35–4.50)

## 2019-12-25 MED ORDER — BUDESONIDE-FORMOTEROL FUMARATE 80-4.5 MCG/ACT IN AERO: 2.0000 | INHALATION_SPRAY | Freq: Two times a day (BID) | RESPIRATORY_TRACT | 11 refills | Status: DC

## 2019-12-25 MED ORDER — ALBUTEROL SULFATE HFA 108 (90 BASE) MCG/ACT IN AERS: 2.0000 | INHALATION_SPRAY | Freq: Four times a day (QID) | RESPIRATORY_TRACT | 11 refills | Status: DC | PRN

## 2019-12-25 NOTE — Assessment & Plan Note (Signed)
Pt declined statins 

## 2019-12-25 NOTE — Assessment & Plan Note (Signed)
Vit D 

## 2019-12-25 NOTE — Assessment & Plan Note (Signed)
Clonazepam prn 

## 2019-12-25 NOTE — Assessment & Plan Note (Signed)
CBC, iron

## 2019-12-25 NOTE — Progress Notes (Signed)
Subjective:    Patient ID: Marilyn [name], female    DOB: September 28, 1955, 64 y.o.   MRN: [id number]  Chief Complaint  Patient presents with  . Annual Exam    HPI Patient is in today for asthmatic bronchitis, HTN, anxiety f/u Well exam  Past Medical History:  Diagnosis Date  . Allergy   . Anemia    hx  . Anxiety    panic disorder  . 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 Cancer ([place])    BREAST - left  . Cigarette smoker   . Colon polyp   . Complication of anesthesia    DIFFICULTY AWAKENING  . Depression   . Diverticulosis of colon   . DJD (degenerative joint disease)   . DJD (degenerative joint disease)   . Family history of breast cancer   . Family history of colon cancer   . Family history of pancreatic cancer   . Family history of prostate cancer   . Gallstones   . GERD (gastroesophageal reflux disease)    Pt. denies having GERD. Unable to remove it.  Marland Kitchen Headache(784.0)   . History of anemia   . History of bronchitis   . History of sarcoidosis   . Hyperlipidemia    no meds needed  . Incisional breast wound    AT AGE 72 BILATERAL INCISION TO BREAST MADE  . Leg swelling   . Panic disorder   . Personal history of radiation therapy   . S/P radiation therapy 08/17/11 - 09/28/11   LLQ - 50 Gy/25 Fractions with Boost of 10 Gy / 5 fractions  . Sialoadenitis of submandibular gland 2016  . Vitamin D deficiency     Past Surgical History:  Procedure Laterality Date  . BIOPSY BREAST     left breast  as a teenager  benign  . BREAST BIOPSY Left 06/06/2011  . BREAST EXCISIONAL BIOPSY Bilateral   . BREAST LUMPECTOMY Left 2013  . BREAST SURGERY  07/05/11   left breast lumpectomy with needle loc & axillary sln bx  . BUNIONECTOMY  2011   bilateral by Dr Little Ishikawa  . COLONOSCOPY     polyp  . cyst removed from right hand  1995  . TONSILLECTOMY  1972  . TOTAL ABDOMINAL HYSTERECTOMY  2000   Dr Nori Riis  .  TRIGGER FINGER RELEASE  2008   Dr Burney Gauze    Family History  Problem Relation Age of Onset  . Anemia Mother   . Other Mother        + H Pylori  . Seizures Mother   . Prostate cancer Father        dx over 16  . Colon cancer Father 10       diagnosed 2009  . Bladder Cancer Father        dx over 43  . Breast cancer Sister        dx in her 62s; mat half sister  . Pancreatic cancer Sister 88       d. 81  . Lung cancer Sister   . Sarcoidosis Sister        mat 1/2 sister  . Multiple sclerosis Sister        #2  . Hypertension Brother        #1  . Hyperlipidemia Brother   . Prostate cancer Brother        dx under 27  .  Breast cancer Cousin        pat first cousin d. 32  . Dementia Maternal Aunt   . Seizures Maternal Uncle   . Lung cancer Paternal Aunt   . Brain cancer Other        benign  . Dementia Maternal Aunt   . Lung cancer Maternal Uncle   . Sarcoidosis Cousin        mat first cousin  . Breast cancer Cousin        maternal 2nd cousins - distant  . Esophageal cancer Neg Hx   . Stomach cancer Neg Hx   . Rectal cancer Neg Hx     Social History   Socioeconomic History  . Marital status: Married    Spouse name: Not on file  . Number of children: 2  . Years of education: Not on file  . Highest education level: Not on file  Occupational History  . Occupation: retired  Tobacco Use  . Smoking status: Current Every Day Smoker    Packs/day: 0.50    Types: Cigarettes    Last attempt to quit: 04/16/2012    Years since quitting: 7.6  . Smokeless tobacco: Never Used  Substance and Sexual Activity  . Alcohol use: Yes    Alcohol/week: 0.0 - 1.0 standard drinks    Comment: social  . Drug use: No  . Sexual activity: Not Currently    Birth control/protection: Surgical    Comment: menses age 73,hrt started  05/22/1998  Other Topics Concern  . Not on file  Social History Narrative   Widowed - husband died 2003/05/22   2 step-children   Exercises 3x per week   2 cups of  caffeine daily         Social Determinants of Health   Financial Resource Strain:   . Difficulty of Paying Living Expenses: Not on file  Food Insecurity:   . Worried About Charity fundraiser in the Last Year: Not on file  . Ran Out of Food in the Last Year: Not on file  Transportation Needs:   . Lack of Transportation (Medical): Not on file  . Lack of Transportation (Non-Medical): Not on file  Physical Activity:   . Days of Exercise per Week: Not on file  . Minutes of Exercise per Session: Not on file  Stress:   . Feeling of Stress : Not on file  Social Connections:   . Frequency of Communication with Friends and Family: Not on file  . Frequency of Social Gatherings with Friends and Family: Not on file  . Attends Religious Services: Not on file  . Active Member of Clubs or Organizations: Not on file  . Attends Archivist Meetings: Not on file  . Marital Status: Not on file  Intimate Partner Violence:   . Fear of Current or Ex-Partner: Not on file  . Emotionally Abused: Not on file  . Physically Abused: Not on file  . Sexually Abused: Not on file    Outpatient Medications Prior to Visit  Medication Sig Dispense Refill  . busPIRone (BUSPAR) 30 MG tablet TAKE 1 /2 (ONE-HALF) TO 1 TABLET TWICE DAILY 60 tablet 5  . celecoxib (CELEBREX) 200 MG capsule Take 1 capsule (200 mg total) by mouth daily as needed for moderate pain. 30 capsule 3  . Cholecalciferol 5000 UNITS TABS Take 1 tablet by mouth 3 (three) times a week.    . clobetasol cream (TEMOVATE) 2.87 % Apply 1 application topically 2 (two)  times daily. (Patient taking differently: Apply 1 application topically 2 (two) times daily as needed. ) 60 g 3  . clonazePAM (KLONOPIN) 0.5 MG tablet TAKE 1/2 TO 1 TABLET BY MOUTH TWICE A DAY AS NEEDED 60 tablet 2  . diclofenac Sodium (VOLTAREN) 1 % GEL Apply 4 g topically 4 (four) times daily.    . Multiple Vitamin (MULTIVITAMIN WITH MINERALS) TABS tablet Take 1 tablet by mouth  daily.    Marland Kitchen triamcinolone ointment (KENALOG) 0.5 % Apply 1 application topically 4 (four) times daily. 30 g 1  . albuterol (VENTOLIN HFA) 108 (90 Base) MCG/ACT inhaler Inhale 2 puffs into the lungs every 6 (six) hours as needed for wheezing or shortness of breath. 18 g 11  . budesonide-formoterol (SYMBICORT) 80-4.5 MCG/ACT inhaler Inhale 2 puffs into the lungs 2 (two) times daily. 1 Inhaler 11  . doxycycline (VIBRA-TABS) 100 MG tablet Take 1 tablet (100 mg total) by mouth 2 (two) times daily. (Patient not taking: Reported on 12/25/2019) 20 tablet 0  . methylPREDNISolone (MEDROL DOSEPAK) 4 MG TBPK tablet As directed (Patient not taking: Reported on 12/25/2019) 21 tablet 0  . promethazine-codeine (PHENERGAN WITH CODEINE) 6.25-10 MG/5ML syrup Take 5 mLs by mouth every 4 (four) hours as needed. (Patient not taking: Reported on 12/25/2019) 300 mL 0   No facility-administered medications prior to visit.    Allergies  Allergen Reactions  . Lexapro [Escitalopram Oxalate]     Felt like zombie  . Penicillins Rash and Other (See Comments)    At injection site.    Review of Systems  Constitutional: Positive for fatigue. Negative for activity change, appetite change, chills and unexpected weight change.  HENT: Negative for congestion, mouth sores and sinus pressure.   Eyes: Negative for visual disturbance.  Respiratory: Negative for cough and chest tightness.   Gastrointestinal: Negative for abdominal pain and nausea.  Genitourinary: Negative for difficulty urinating, frequency and vaginal pain.  Musculoskeletal: Positive for arthralgias and back pain. Negative for gait problem.  Skin: Negative for pallor and rash.  Neurological: Negative for dizziness, tremors, weakness, numbness and headaches.  Psychiatric/Behavioral: Negative for confusion and sleep disturbance. The patient is nervous/anxious.        Objective:    Physical Exam Constitutional:      General: She is not in acute distress.     Appearance: She is well-developed. She is obese.  HENT:     Head: Normocephalic.     Right Ear: External ear normal.     Left Ear: External ear normal.     Nose: Nose normal.  Eyes:     General:        Right eye: No discharge.        Left eye: No discharge.     Conjunctiva/sclera: Conjunctivae normal.     Pupils: Pupils are equal, round, and reactive to light.  Neck:     Thyroid: No thyromegaly.     Vascular: No JVD.     Trachea: No tracheal deviation.  Cardiovascular:     Rate and Rhythm: Normal rate and regular rhythm.     Heart sounds: Normal heart sounds.  Pulmonary:     Effort: No respiratory distress.     Breath sounds: No stridor. No wheezing.  Abdominal:     General: Bowel sounds are normal. There is no distension.     Palpations: Abdomen is soft. There is no mass.     Tenderness: There is no abdominal tenderness. There is no guarding or rebound.  Musculoskeletal:        General: No tenderness.     Cervical back: Normal range of motion and neck supple.  Lymphadenopathy:     Cervical: No cervical adenopathy.  Skin:    Findings: No erythema or rash.  Neurological:     Mental Status: She is oriented to person, place, and time.     Cranial Nerves: No cranial nerve deficit.     Motor: No abnormal muscle tone.     Coordination: Coordination normal.     Deep Tendon Reflexes: Reflexes normal.  Psychiatric:        Behavior: Behavior normal.        Thought Content: Thought content normal.        Judgment: Judgment normal.    I spent 23 minutes in addition to time for CPX wellness examination in preparing to see the patient by review of recent labs, imaging and procedures, obtaining and reviewing separately obtained history, communicating with the patient, ordering medications, tests or procedures, and documenting clinical information in the EHR including the differential diagnosis, treatment, and any further evaluation and other management of asthma, dyslipidemia.          BP (!) 142/82 (BP Location: Left Arm)   Pulse 73   Temp 98.4 F (36.9 C) (Oral)   Wt 192 lb 12.8 oz (87.5 kg)   SpO2 98%   BMI 32.08 kg/m  Wt Readings from Last 3 Encounters:  12/25/19 192 lb 12.8 oz (87.5 kg)  08/28/19 191 lb (86.6 kg)  08/11/19 190 lb (86.2 kg)    Health Maintenance Due  Topic Date Due  . HIV Screening  Never done  . PAP SMEAR-Modifier  05/05/2018  . INFLUENZA VACCINE  09/28/2019    There are no preventive care reminders to display for this patient.   Lab Results  Component Value Date   TSH 1.33 09/18/2018   Lab Results  Component Value Date   WBC 7.9 11/12/2018   HGB 12.9 11/12/2018   HCT 39.8 11/12/2018   MCV 75.2 (L) 11/12/2018   PLT 194.0 11/12/2018   Lab Results  Component Value Date   NA 138 08/28/2019   K 4.2 08/28/2019   CHLORIDE 106 11/04/2013   CO2 28 08/28/2019   GLUCOSE 93 08/28/2019   BUN 14 08/28/2019   CREATININE 0.77 08/28/2019   BILITOT 0.3 11/12/2018   ALKPHOS 66 11/12/2018   AST 11 11/12/2018   ALT 10 11/12/2018   PROT 7.1 11/12/2018   ALBUMIN 4.2 11/12/2018   CALCIUM 10.0 08/28/2019   ANIONGAP 7 07/14/2016   GFR 90.89 08/28/2019   Lab Results  Component Value Date   CHOL 226 (H) 09/18/2018   Lab Results  Component Value Date   HDL 72.90 09/18/2018   Lab Results  Component Value Date   LDLCALC 132 (H) 09/18/2018   Lab Results  Component Value Date   TRIG 103.0 09/18/2018   Lab Results  Component Value Date   CHOLHDL 3 09/18/2018   No results found for: HGBA1C     Assessment & Plan:   Problem List Items Addressed This Visit    AB (asthmatic bronchitis)    Albuterol      Relevant Medications   albuterol (VENTOLIN HFA) 108 (90 Base) MCG/ACT inhaler   budesonide-formoterol (SYMBICORT) 80-4.5 MCG/ACT inhaler   Anxiety    Clonazepam prn      Dyslipidemia    Pt declined statins       Other Visit Diagnoses  Needs flu shot    -  Primary   Relevant Orders   Flu Vaccine QUAD High  Dose(Fluad)       Meds ordered this encounter  Medications  . albuterol (VENTOLIN HFA) 108 (90 Base) MCG/ACT inhaler    Sig: Inhale 2 puffs into the lungs every 6 (six) hours as needed for wheezing or shortness of breath.    Dispense:  18 g    Refill:  11  . budesonide-formoterol (SYMBICORT) 80-4.5 MCG/ACT inhaler    Sig: Inhale 2 puffs into the lungs 2 (two) times daily.    Dispense:  10 g    Refill:  11     Walker Kehr, MD

## 2019-12-25 NOTE — Patient Instructions (Signed)
Cardiac CT calcium scoring test $150 Tel # is [phone]   Computed tomography, more commonly known as a CT or CAT scan, is a diagnostic medical imaging test. Like traditional x-rays, it produces multiple images or pictures of the inside of the body. The cross-sectional images generated during a CT scan can be reformatted in multiple planes. They can even generate three-dimensional images. These images can be viewed on a computer monitor, printed on film or by a 3D printer, or transferred to a CD or DVD. CT images of internal organs, bones, soft tissue and blood vessels provide greater detail than traditional x-rays, particularly of soft tissues and blood vessels. A cardiac CT scan for coronary calcium is a non-invasive way of obtaining information about the presence, location and extent of calcified plaque in the coronary arteries--the vessels that supply oxygen-containing blood to the heart muscle. Calcified plaque results when there is a build-up of fat and other substances under the inner layer of the artery. This material can calcify which signals the presence of atherosclerosis, a disease of the vessel wall, also called coronary artery disease (CAD). People with this disease have an increased risk for heart attacks. In addition, over time, progression of plaque build up (CAD) can narrow the arteries or even close off blood flow to the heart. The result may be chest pain, sometimes called "angina," or a heart attack. Because calcium is a marker of CAD, the amount of calcium detected on a cardiac CT scan is a helpful prognostic tool. The findings on cardiac CT are expressed as a calcium score. Another name for this test is coronary artery calcium scoring.  What are some common uses of the procedure? The goal of cardiac CT scan for calcium scoring is to determine if CAD is present and to what extent, even if there are no symptoms. It is a screening study that may be recommended by a physician for  patients with risk factors for CAD but no clinical symptoms. The major risk factors for CAD are: . high blood cholesterol levels  . family history of heart attacks  . diabetes  . high blood pressure  . cigarette smoking  . overweight or obese  . physical inactivity   A negative cardiac CT scan for calcium scoring shows no calcification within the coronary arteries. This suggests that CAD is absent or so minimal it cannot be seen by this technique. The chance of having a heart attack over the next two to five years is very low under these circumstances. A positive test means that CAD is present, regardless of whether or not the patient is experiencing any symptoms. The amount of calcification--expressed as the calcium score--may help to predict the likelihood of a myocardial infarction (heart attack) in the coming years and helps your medical doctor or cardiologist decide whether the patient may need to take preventive medicine or undertake other measures such as diet and exercise to lower the risk for heart attack. The extent of CAD is graded according to your calcium score:  Calcium Score  Presence of CAD (coronary artery disease)  0 No evidence of CAD   1-10 Minimal evidence of CAD  11-100 Mild evidence of CAD  101-400 Moderate evidence of CAD  Over 400 Extensive evidence of CAD    

## 2019-12-25 NOTE — Assessment & Plan Note (Addendum)
We discussed age appropriate health related issues, including available/recomended screening tests and vaccinations. We discussed a need for adhering to healthy diet and exercise. Labs were ordered to be later reviewed . All questions were answered. Trying to quit smoking Colon 2016 due in 2021 Ordered  calc score CT

## 2019-12-25 NOTE — Assessment & Plan Note (Signed)
Albuterol

## 2019-12-25 NOTE — Assessment & Plan Note (Signed)
Due colonoscopy.  

## 2019-12-26 LAB — IRON,TIBC AND FERRITIN PANEL
%SAT: 23 % (calc) (ref 16–45)
Ferritin: 110 ng/mL (ref 16–288)
Iron: 74 ug/dL (ref 45–160)
TIBC: 319 mcg/dL (calc) (ref 250–450)

## 2020-01-07 ENCOUNTER — Encounter: Payer: Self-pay | Admitting: Adult Health

## 2020-01-07 ENCOUNTER — Encounter: Payer: Self-pay | Admitting: Gastroenterology

## 2020-01-07 ENCOUNTER — Inpatient Hospital Stay: Payer: No Typology Code available for payment source | Attending: Adult Health | Admitting: Adult Health

## 2020-01-07 ENCOUNTER — Other Ambulatory Visit: Payer: Self-pay

## 2020-01-07 VITALS — BP 156/84 | HR 75 | Temp 97.3°F | Resp 17 | Ht 65.0 in | Wt 194.2 lb

## 2020-01-07 DIAGNOSIS — E785 Hyperlipidemia, unspecified: Secondary | ICD-10-CM | POA: Insufficient documentation

## 2020-01-07 DIAGNOSIS — Z8349 Family history of other endocrine, nutritional and metabolic diseases: Secondary | ICD-10-CM | POA: Insufficient documentation

## 2020-01-07 DIAGNOSIS — Z880 Allergy status to penicillin: Secondary | ICD-10-CM | POA: Diagnosis not present

## 2020-01-07 DIAGNOSIS — Z800 Family history of malignant neoplasm of digestive organs: Secondary | ICD-10-CM | POA: Diagnosis not present

## 2020-01-07 DIAGNOSIS — Z8269 Family history of other diseases of the musculoskeletal system and connective tissue: Secondary | ICD-10-CM | POA: Insufficient documentation

## 2020-01-07 DIAGNOSIS — Z79811 Long term (current) use of aromatase inhibitors: Secondary | ICD-10-CM | POA: Diagnosis not present

## 2020-01-07 DIAGNOSIS — C50412 Malignant neoplasm of upper-outer quadrant of left female breast: Secondary | ICD-10-CM | POA: Insufficient documentation

## 2020-01-07 DIAGNOSIS — Z888 Allergy status to other drugs, medicaments and biological substances status: Secondary | ICD-10-CM | POA: Insufficient documentation

## 2020-01-07 DIAGNOSIS — Z79899 Other long term (current) drug therapy: Secondary | ICD-10-CM | POA: Insufficient documentation

## 2020-01-07 DIAGNOSIS — Z820 Family history of epilepsy and other diseases of the nervous system: Secondary | ICD-10-CM | POA: Diagnosis not present

## 2020-01-07 DIAGNOSIS — F17210 Nicotine dependence, cigarettes, uncomplicated: Secondary | ICD-10-CM | POA: Insufficient documentation

## 2020-01-07 DIAGNOSIS — F419 Anxiety disorder, unspecified: Secondary | ICD-10-CM | POA: Insufficient documentation

## 2020-01-07 DIAGNOSIS — Z818 Family history of other mental and behavioral disorders: Secondary | ICD-10-CM | POA: Diagnosis not present

## 2020-01-07 DIAGNOSIS — Z801 Family history of malignant neoplasm of trachea, bronchus and lung: Secondary | ICD-10-CM | POA: Diagnosis not present

## 2020-01-07 DIAGNOSIS — Z8042 Family history of malignant neoplasm of prostate: Secondary | ICD-10-CM | POA: Insufficient documentation

## 2020-01-07 DIAGNOSIS — Z7182 Exercise counseling: Secondary | ICD-10-CM | POA: Diagnosis not present

## 2020-01-07 DIAGNOSIS — J449 Chronic obstructive pulmonary disease, unspecified: Secondary | ICD-10-CM | POA: Diagnosis not present

## 2020-01-07 DIAGNOSIS — Z170 Estrogen receptor positive status [ER+]: Secondary | ICD-10-CM | POA: Insufficient documentation

## 2020-01-07 DIAGNOSIS — Z8719 Personal history of other diseases of the digestive system: Secondary | ICD-10-CM | POA: Insufficient documentation

## 2020-01-07 DIAGNOSIS — N644 Mastodynia: Secondary | ICD-10-CM | POA: Insufficient documentation

## 2020-01-07 DIAGNOSIS — Z923 Personal history of irradiation: Secondary | ICD-10-CM | POA: Diagnosis not present

## 2020-01-07 DIAGNOSIS — Z8249 Family history of ischemic heart disease and other diseases of the circulatory system: Secondary | ICD-10-CM | POA: Diagnosis not present

## 2020-01-07 DIAGNOSIS — Z803 Family history of malignant neoplasm of breast: Secondary | ICD-10-CM | POA: Diagnosis not present

## 2020-01-07 DIAGNOSIS — Z832 Family history of diseases of the blood and blood-forming organs and certain disorders involving the immune mechanism: Secondary | ICD-10-CM | POA: Insufficient documentation

## 2020-01-07 NOTE — Progress Notes (Signed)
CLINIC:  Survivorship   REASON FOR VISIT:  Routine follow-up for history of breast cancer.   BRIEF ONCOLOGIC HISTORY:  Oncology History Overview Note  ATM and MUTYH VUS.  Otherwise negative genetic testing   Malignant neoplasm of upper-outer quadrant of left breast in female, estrogen receptor positive ([place])  06/20/2011 Surgery   Left breast lumpectomy with sentinel lymph node biopsy: 1 cm IDC, grade 1, ER positive, PR positive, HER-2 negative, Ki-67 6%   08/17/2011 - 09/28/2011 Radiation Therapy   Adjuvant radiation therapy   10/02/2011 - 11/23/2016 Anti-estrogen oral therapy   Arimidex 1 mg daily started 10/02/2011 stopped March 2015 for pain and restarted August 2015   12/21/2017 Genetic Testing   ATM c.3191T>C and MUTYH c.920G>A VUS identified on the common hereditary cancer panel.  The Hereditary Gene Panel offered by [facility] includes sequencing and/or deletion duplication testing of the following 47 genes: APC, ATM, AXIN2, BARD1, BMPR1A, BRCA1, BRCA2, BRIP1, CDH1, CDK4, CDKN2A (p14ARF), CDKN2A (p16INK4a), CHEK2, CTNNA1, DICER1, EPCAM (Deletion/duplication testing only), GREM1 (promoter region deletion/duplication testing only), KIT, MEN1, MLH1, MSH2, MSH3, MSH6, MUTYH, NBN, NF1, NHTL1, PALB2, PDGFRA, PMS2, POLD1, POLE, PTEN, RAD50, RAD51C, RAD51D, SDHB, SDHC, SDHD, SMAD4, SMARCA4. STK11, TP53, TSC1, TSC2, and VHL.  The following genes were evaluated for sequence changes only: SDHA and HOXB13 c.251G>A variant only. The report date is 12/21/2017.      INTERVAL HISTORY:  Marilyn [name] presents to the [facility] today for routine follow-up for her history of breast cancer.  She notes that she is doing well today.  Her most recent mammogram was completed on her left breast in 07/2019.  This was a diagnostic mammogram and ultrasound because she was concerned that the cyst in her breast was getting bigger.  She was 2 weeks ago from a screening mammogram for the right breast, and was  recommended to return in 2 weeks for her screening mammogram, however she did not complete that.    She remains concerned about the left breast.  She notes that sometimes she has pain at the breast.  She is not exercising because she is caring for her parents and her father in law.  She notes that she sees her PCP regularly and is up to date on her cancer screenings.      REVIEW OF SYSTEMS:  Review of Systems  Constitutional: Negative for appetite change, chills, fatigue, fever and unexpected weight change.  HENT:   Negative for hearing loss, lump/mass, sore throat, tinnitus and trouble swallowing.   Eyes: Negative for eye problems and icterus.  Respiratory: Negative for chest tightness, cough, shortness of breath and wheezing.   Cardiovascular: Negative for chest pain, leg swelling and palpitations.  Gastrointestinal: Negative for abdominal distention, abdominal pain, constipation, diarrhea, nausea and vomiting.  Endocrine: Negative for hot flashes.  Genitourinary: Negative for difficulty urinating.   Musculoskeletal: Negative for arthralgias.  Skin: Negative for itching and rash.  Neurological: Negative for dizziness, extremity weakness, headaches and numbness.  Hematological: Negative for adenopathy. Does not bruise/bleed easily.  Psychiatric/Behavioral: Negative for depression. The patient is not nervous/anxious.   Breast: Denies any new nodularity, masses, tenderness, nipple changes, or nipple discharge.       PAST MEDICAL/SURGICAL HISTORY:  Past Medical History:  Diagnosis Date  . Allergy   . Anemia    hx  . Anxiety    panic disorder  . Breast cancer ([place]) 06/06/2011   bc  left breast 3 o'clock dx=invasive ductal ca uoqER/PR=positive  . Cancer ([place])  BREAST - left  . Cigarette smoker   . Colon polyp   . Complication of anesthesia    DIFFICULTY AWAKENING  . Depression   . Diverticulosis of colon   . DJD (degenerative joint disease)   . DJD (degenerative joint  disease)   . Family history of breast cancer   . Family history of colon cancer   . Family history of pancreatic cancer   . Family history of prostate cancer   . Gallstones   . GERD (gastroesophageal reflux disease)    Pt. denies having GERD. Unable to remove it.  Marland Kitchen Headache(784.0)   . History of anemia   . History of bronchitis   . History of sarcoidosis   . Hyperlipidemia    no meds needed  . Incisional breast wound    AT AGE 57 BILATERAL INCISION TO BREAST MADE  . Leg swelling   . Panic disorder   . Personal history of radiation therapy   . S/P radiation therapy 08/17/11 - 09/28/11   LLQ - 50 Gy/25 Fractions with Boost of 10 Gy / 5 fractions  . Sialoadenitis of submandibular gland 2016  . Vitamin D deficiency    Past Surgical History:  Procedure Laterality Date  . BIOPSY BREAST     left breast  as a teenager  benign  . BREAST BIOPSY Left 06/06/2011  . BREAST EXCISIONAL BIOPSY Bilateral   . BREAST LUMPECTOMY Left 2013  . BREAST SURGERY  07/05/11   left breast lumpectomy with needle loc & axillary sln bx  . BUNIONECTOMY  2011   bilateral by Dr Little Ishikawa  . COLONOSCOPY     polyp  . cyst removed from right hand  1995  . TONSILLECTOMY  1972  . TOTAL ABDOMINAL HYSTERECTOMY  2000   Dr Nori Riis  . TRIGGER FINGER RELEASE  2008   Dr Burney Gauze     ALLERGIES:  Allergies  Allergen Reactions  . Lexapro [Escitalopram Oxalate]     Felt like zombie  . Penicillins Rash and Other (See Comments)    At injection site.     CURRENT MEDICATIONS:  Outpatient Encounter Medications as of 01/07/2020  Medication Sig  . albuterol (VENTOLIN HFA) 108 (90 Base) MCG/ACT inhaler Inhale 2 puffs into the lungs every 6 (six) hours as needed for wheezing or shortness of breath.  . budesonide-formoterol (SYMBICORT) 80-4.5 MCG/ACT inhaler Inhale 2 puffs into the lungs 2 (two) times daily.  . busPIRone (BUSPAR) 30 MG tablet TAKE 1 /2 (ONE-HALF) TO 1 TABLET TWICE DAILY  . celecoxib (CELEBREX) 200 MG  capsule Take 1 capsule (200 mg total) by mouth daily as needed for moderate pain.  . Cholecalciferol 5000 UNITS TABS Take 1 tablet by mouth 3 (three) times a week.  . clobetasol cream (TEMOVATE) 5.88 % Apply 1 application topically 2 (two) times daily. (Patient taking differently: Apply 1 application topically 2 (two) times daily as needed. )  . clonazePAM (KLONOPIN) 0.5 MG tablet TAKE 1/2 TO 1 TABLET BY MOUTH TWICE A DAY AS NEEDED  . diclofenac Sodium (VOLTAREN) 1 % GEL Apply 4 g topically 4 (four) times daily.  . Multiple Vitamin (MULTIVITAMIN WITH MINERALS) TABS tablet Take 1 tablet by mouth daily.  Marland Kitchen triamcinolone ointment (KENALOG) 0.5 % Apply 1 application topically 4 (four) times daily.  . [DISCONTINUED] pantoprazole (PROTONIX) 40 MG tablet Take 1 tablet (40 mg total) by mouth daily.   No facility-administered encounter medications on file as of 01/07/2020.     ONCOLOGIC FAMILY HISTORY:  Family History  Problem Relation Age of Onset  . Anemia Mother   . Other Mother        + H Pylori  . Seizures Mother   . Prostate cancer Father        dx over 1  . Colon cancer Father 8       diagnosed 04/15/07  . Bladder Cancer Father        dx over 53  . Breast cancer Sister        dx in her 44s; mat half sister  . Pancreatic cancer Sister 13       d. 13  . Lung cancer Sister   . Sarcoidosis Sister        mat 1/2 sister  . Multiple sclerosis Sister        #2  . Hypertension Brother        #1  . Hyperlipidemia Brother   . Prostate cancer Brother        dx under 29  . Breast cancer Cousin        pat first cousin d. 66  . Dementia Maternal Aunt   . Seizures Maternal Uncle   . Lung cancer Paternal Aunt   . Brain cancer Other        benign  . Dementia Maternal Aunt   . Lung cancer Maternal Uncle   . Sarcoidosis Cousin        mat first cousin  . Breast cancer Cousin        maternal 2nd cousins - distant  . Esophageal cancer Neg Hx   . Stomach cancer Neg Hx   . Rectal cancer Neg  Hx     GENETIC COUNSELING/TESTING: BRCA and BART tested in 04-15-11 and negative  SOCIAL HISTORY:  Social History   Socioeconomic History  . Marital status: Married    Spouse name: Not on file  . Number of children: 2  . Years of education: Not on file  . Highest education level: Not on file  Occupational History  . Occupation: retired  Tobacco Use  . Smoking status: Current Every Day Smoker    Packs/day: 0.50    Types: Cigarettes    Last attempt to quit: 04/16/2012    Years since quitting: 7.7  . Smokeless tobacco: Never Used  Substance and Sexual Activity  . Alcohol use: Yes    Alcohol/week: 0.0 - 1.0 standard drinks    Comment: social  . Drug use: No  . Sexual activity: Not Currently    Birth control/protection: Surgical    Comment: menses age 37,hrt started  1998/04/14  Other Topics Concern  . Not on file  Social History Narrative   Widowed - husband died 2003/04/15   2 step-children   Exercises 3x per week   2 cups of caffeine daily         Social Determinants of Health   Financial Resource Strain:   . Difficulty of Paying Living Expenses: Not on file  Food Insecurity:   . Worried About Charity fundraiser in the Last Year: Not on file  . Ran Out of Food in the Last Year: Not on file  Transportation Needs:   . Lack of Transportation (Medical): Not on file  . Lack of Transportation (Non-Medical): Not on file  Physical Activity:   . Days of Exercise per Week: Not on file  . Minutes of Exercise per Session: Not on file  Stress:   . Feeling of Stress : Not  on file  Social Connections:   . Frequency of Communication with Friends and Family: Not on file  . Frequency of Social Gatherings with Friends and Family: Not on file  . Attends Religious Services: Not on file  . Active Member of Clubs or Organizations: Not on file  . Attends Banker Meetings: Not on file  . Marital Status: Not on file  Intimate Partner Violence:   . Fear of Current or Ex-Partner: Not  on file  . Emotionally Abused: Not on file  . Physically Abused: Not on file  . Sexually Abused: Not on file      PHYSICAL EXAMINATION:  Vital Signs: Vitals:   01/07/20 1014  BP: (!) 156/84  Pulse: 75  Resp: 17  Temp: (!) 97.3 F (36.3 C)  SpO2: 99%   Filed Weights   01/07/20 1014  Weight: 194 lb 3.2 oz (88.1 kg)   General: Well-nourished, well-appearing female in no acute distress.  Unaccompanied today.   HEENT: Head is normocephalic.  Pupils equal and reactive to light. Conjunctivae clear without exudate.  Sclerae anicteric. Oral mucosa is pink, moist.  Oropharynx is pink without lesions or erythema.  Lymph: No cervical, supraclavicular, or infraclavicular lymphadenopathy noted on palpation.  Cardiovascular: Regular rate and rhythm.Marland Kitchen Respiratory: Clear to auscultation bilaterally. Chest expansion symmetric; breathing non-labored.  Breast Exam:  -Left breast:. No skin redness, thickening, or peau d'orange appearance; no nipple retraction or nipple discharge; small about 0.5cm nodule noted at 3 oclock on left breast about 8-9cmfn (same as last year) -Right breast: No appreciable masses on palpation. No skin redness, thickening, or peau d'orange appearance; no nipple retraction or nipple discharge; -Axilla: No axillary adenopathy bilaterally.  GI: Abdomen soft and round; non-tender, non-distended. Bowel sounds normoactive. No hepatosplenomegaly.   GU: Deferred.  Neuro: No focal deficits. Steady gait.  Psych: Mood and affect normal and appropriate for situation.  MSK: No focal spinal tenderness to palpation, full range of motion in bilateral upper extremities Extremities: No edema. Skin: Warm and dry.  LABORATORY DATA:  None for this visit   DIAGNOSTIC IMAGING:  Most recent mammogram:   ASSESSMENT AND PLAN:  Ms.. [name] is a pleasant 64 y.o. female with history of Stage IA left breast invasive ductal carcinoma, ER+/PR+/HER2-, diagnosed in 2013, treated with lumpectomy,  adjuvant radiation therapy, and anti-estrogen therapy with Anastrozole x 5 years completing in 10/2016.  She presents to the [facility] for surveillance and routine follow-up.   1. History of breast cancer:  Marilyn [name] is currently clinically and radiographically without evidence of disease or recurrence of breast cancer. She will be due for mammogram in 07/2020.  She is going to call the breast center and see what she can do about getting caught up with her mammograms and  She will return in one year for LTS follow up.  I encouraged her to call me with any questions or concerns before her next visit at the cancer center, and I would be happy to see her sooner, if needed.    2. Oil Cyst Left breast: She has continued concern about this area.  She has undergone serial ultrasounds, however she still notes she has some symptoms with it.  I recommended that she see [facility], and talk to a surgeon about potential interventions.    2. Bone health:  She was given education on specific food and activities to promote bone health.  3. Cancer screening:  Due to Marilyn [name]'s history and her age, she should  receive screening for skin cancers, colon cancer, and gynecologic cancers. She was encouraged to follow-up with her PCP for appropriate cancer screenings.   4. Health maintenance and wellness promotion: Marilyn [name] was encouraged to consume 5-7 servings of fruits and vegetables per day. She was also encouraged to engage in moderate to vigorous exercise for 30 minutes per day most days of the week. She was instructed to limit her alcohol consumption and was encouraged stop smoking.  We reviewed ideas for smoking cessation, and ways to quit smoking.    Dispo:  -Return to cancer center in one year for LTS follow up -Mammogram, patient to coordinate right breast overdue, left breast due in June, 2022.   Total encounter time: 20 minutes*  Wilber Bihari, NP 01/07/20 10:42 AM Medical Oncology and  Hematology [facility] [place], [place] [address] Tel. [phone]    Fax. [phone]  *Total Encounter Time as defined by the Centers for Medicare and Medicaid Services includes, in addition to the face-to-face time of a patient visit (documented in the note above) non-face-to-face time: obtaining and reviewing outside history, ordering and reviewing medications, tests or procedures, care coordination (communications with other health care professionals or caregivers) and documentation in the medical record.    Note: PRIMARY CARE PROVIDER Cassandria Anger, [place] [phone]

## 2020-01-09 ENCOUNTER — Encounter: Payer: Self-pay | Admitting: Internal Medicine

## 2020-01-09 ENCOUNTER — Encounter: Payer: Self-pay | Admitting: Gastroenterology

## 2020-02-24 ENCOUNTER — Ambulatory Visit (AMBULATORY_SURGERY_CENTER): Payer: Self-pay

## 2020-02-24 ENCOUNTER — Other Ambulatory Visit: Payer: Self-pay

## 2020-02-24 VITALS — Ht 65.0 in | Wt 196.0 lb

## 2020-02-24 DIAGNOSIS — Z800 Family history of malignant neoplasm of digestive organs: Secondary | ICD-10-CM

## 2020-02-24 DIAGNOSIS — Z86010 Personal history of colonic polyps: Secondary | ICD-10-CM

## 2020-02-24 MED ORDER — SUTAB 1479-225-188 MG PO TABS: 12.0000 | ORAL_TABLET | ORAL | 0 refills | Status: DC

## 2020-02-24 NOTE — Progress Notes (Signed)
No egg or soy allergy known to patient  No issues with past sedation with any surgeries or procedures No intubation problems in the past  No FH of Malignant Hyperthermia No diet pills per patient No home 02 use per patient  No blood thinners per patient  Pt denies issues with constipation  No A fib or A flutter  EMMI video to pt or via MyChart  COVID 19 guidelines implemented in [facility] today with Pt and RN  Pt is fully vaccinated  for Covid   Sutab  Coupon given to pt in PV today , Code to Pharmacy   Due to the COVID-19 pandemic we are asking patients to follow certain guidelines.  Pt aware of COVID protocols and [facility] guidelines   

## 2020-02-25 ENCOUNTER — Encounter: Payer: Self-pay | Admitting: Gastroenterology

## 2020-03-03 ENCOUNTER — Encounter: Payer: Self-pay | Admitting: Gastroenterology

## 2020-03-03 ENCOUNTER — Other Ambulatory Visit: Payer: Self-pay

## 2020-03-03 ENCOUNTER — Ambulatory Visit (AMBULATORY_SURGERY_CENTER): Payer: No Typology Code available for payment source | Admitting: Gastroenterology

## 2020-03-03 VITALS — BP 146/75 | HR 63 | Temp 97.3°F | Resp 14 | Ht 65.0 in | Wt 196.0 lb

## 2020-03-03 DIAGNOSIS — D122 Benign neoplasm of ascending colon: Secondary | ICD-10-CM | POA: Diagnosis not present

## 2020-03-03 DIAGNOSIS — D128 Benign neoplasm of rectum: Secondary | ICD-10-CM

## 2020-03-03 DIAGNOSIS — D123 Benign neoplasm of transverse colon: Secondary | ICD-10-CM | POA: Diagnosis not present

## 2020-03-03 DIAGNOSIS — Z800 Family history of malignant neoplasm of digestive organs: Secondary | ICD-10-CM

## 2020-03-03 DIAGNOSIS — Z86010 Personal history of colonic polyps: Secondary | ICD-10-CM

## 2020-03-03 MED ORDER — SODIUM CHLORIDE 0.9 % IV SOLN: 500.0000 mL | Freq: Once | INTRAVENOUS | Status: DC

## 2020-03-03 NOTE — Progress Notes (Signed)
Report given to PACU, vss 

## 2020-03-03 NOTE — Patient Instructions (Signed)
YOU HAD AN ENDOSCOPIC PROCEDURE TODAY AT THE [facility]:   Refer to the procedure report that was given to you for any specific questions about what was found during the examination.  If the procedure report does not answer your questions, please call your gastroenterologist to clarify.  If you requested that your care partner not be given the details of your procedure findings, then the procedure report has been included in a sealed envelope for you to review at your convenience later.  YOU SHOULD EXPECT: Some feelings of bloating in the abdomen. Passage of more gas than usual.  Walking can help get rid of the air that was put into your GI tract during the procedure and reduce the bloating. If you had a lower endoscopy (such as a colonoscopy or flexible sigmoidoscopy) you may notice spotting of blood in your stool or on the toilet paper. If you underwent a bowel prep for your procedure, you may not have a normal bowel movement for a few days.  Please Note:  You might notice some irritation and congestion in your nose or some drainage.  This is from the oxygen used during your procedure.  There is no need for concern and it should clear up in a day or so.  SYMPTOMS TO REPORT IMMEDIATELY:   Following lower endoscopy (colonoscopy or flexible sigmoidoscopy):  Excessive amounts of blood in the stool  Significant tenderness or worsening of abdominal pains  Swelling of the abdomen that is new, acute  Fever of 100F or higher  For urgent or emergent issues, a gastroenterologist can be reached at any hour by calling [phone]. Do not use MyChart messaging for urgent concerns.    DIET:  We do recommend a small meal at first, but then you may proceed to your regular diet.  Drink plenty of fluids but you should avoid alcoholic beverages for 24 hours.  ACTIVITY:  You should plan to take it easy for the rest of today and you should NOT DRIVE or use heavy machinery until tomorrow (because  of the sedation medicines used during the test).    FOLLOW UP: Our staff will call the number listed on your records 48-72 hours following your procedure to check on you and address any questions or concerns that you may have regarding the information given to you following your procedure. If we do not reach you, we will leave a message.  We will attempt to reach you two times.  During this call, we will ask if you have developed any symptoms of COVID 19. If you develop any symptoms (ie: fever, flu-like symptoms, shortness of breath, cough etc.) before then, please call [phone].  If you test positive for Covid 19 in the 2 weeks post procedure, please call and report this information to us.    If any biopsies were taken you will be contacted by phone or by letter within the next 1-3 weeks.  Please call us at [phone] if you have not heard about the biopsies in 3 weeks.    SIGNATURES/CONFIDENTIALITY: You and/or your care partner have signed paperwork which will be entered into your electronic medical record.  These signatures attest to the fact that that the information above on your After Visit Summary has been reviewed and is understood.  Full responsibility of the confidentiality of this discharge information lies with you and/or your care-partner. 

## 2020-03-03 NOTE — Progress Notes (Signed)
Called to room to assist during endoscopic procedure.  Patient ID and intended procedure confirmed with present staff. Received instructions for my participation in the procedure from the performing physician.  

## 2020-03-03 NOTE — Op Note (Signed)
[facility] Patient Name: Marilyn [name] Procedure Date: 03/03/2020 8:40 AM MRN: [id number] Endoscopist: Mauri Pole , MD Age: 65 Referring MD:  Date of Birth: [birth year] Gender: Female Account #: [id number] Procedure:                Colonoscopy Indications:              Screening in patient at increased risk: Family                            history of 1st-degree relative with colorectal                            cancer, High risk colon cancer surveillance:                            Personal history of colonic polyps, High risk colon                            cancer surveillance: Personal history of adenoma                            less than 10 mm in size Medicines:                Monitored Anesthesia Care Procedure:                Pre-Anesthesia Assessment:                           - Prior to the procedure, a History and Physical                            was performed, and patient medications and                            allergies were reviewed. The patient's tolerance of                            previous anesthesia was also reviewed. The risks                            and benefits of the procedure and the sedation                            options and risks were discussed with the patient.                            All questions were answered, and informed consent                            was obtained. Prior Anticoagulants: The patient has                            taken no previous anticoagulant or antiplatelet  agents. ASA Grade Assessment: III - A patient with                            severe systemic disease. After reviewing the risks                            and benefits, the patient was deemed in                            satisfactory condition to undergo the procedure.                           After obtaining informed consent, the colonoscope                            was passed under direct vision.  Throughout the                            procedure, the patient's blood pressure, pulse, and                            oxygen saturations were monitored continuously. The                            Olympus [id number]-[id number]) Colonoscope was                            introduced through the anus and advanced to the the                            cecum, identified by appendiceal orifice and                            ileocecal valve. The colonoscopy was performed                            without difficulty. The patient tolerated the                            procedure well. The quality of the bowel                            preparation was good. The ileocecal valve,                            appendiceal orifice, and rectum were photographed. Scope In: 9:07:46 AM Scope Out: 9:27:32 AM Scope Withdrawal Time: 0 hours 14 minutes 49 seconds  Total Procedure Duration: 0 hours 19 minutes 46 seconds  Findings:                 The perianal and digital rectal examinations were                            normal.  Six sessile polyps were found in the rectum X4,                            transverse colon X1 and ascending colon X1. The                            polyps were 4 to 10 mm in size. These polyps were                            removed with a cold snare. Resection and retrieval                            were complete.                           Scattered small and large-mouthed diverticula were                            found in the sigmoid colon.                           Non-bleeding external and internal hemorrhoids were                            found during retroflexion. The hemorrhoids were                            small.                           The exam was otherwise without abnormality. Complications:            No immediate complications. Estimated Blood Loss:     Estimated blood loss was minimal. Impression:               - Six 4 to 10 mm  polyps in the rectum, in the                            transverse colon and in the ascending colon,                            removed with a cold snare. Resected and retrieved.                           - Diverticulosis in the sigmoid colon.                           - Non-bleeding external and internal hemorrhoids.                           - The examination was otherwise normal. Recommendation:           - Patient has a contact number available for  emergencies. The signs and symptoms of potential                            delayed complications were discussed with the                            patient. Return to normal activities tomorrow.                            Written discharge instructions were provided to the                            patient.                           - Resume previous diet.                           - Continue present medications.                           - Await pathology results.                           - Repeat colonoscopy in 3 - 5 years for                            surveillance based on pathology results. Napoleon Form, MD 03/03/2020 9:38:59 AM This report has been signed electronically.

## 2020-03-03 NOTE — Progress Notes (Signed)
VS-MO  Pt's states no medical or surgical changes since previsit or office visit.  

## 2020-03-05 ENCOUNTER — Telehealth: Payer: Self-pay | Admitting: *Deleted

## 2020-03-05 NOTE — Telephone Encounter (Signed)
  Follow up Call-  Call back number 03/03/2020  Post procedure Call Back phone  # [phone]  Permission to leave phone message Yes  Some recent data might be hidden     Patient questions:  Do you have a fever, pain , or abdominal swelling? No. Pain Score  0 *  Have you tolerated food without any problems? Yes.    Have you been able to return to your normal activities? Yes.    Do you have any questions about your discharge instructions: Diet   No. Medications  No. Follow up visit  No.  Do you have questions or concerns about your Care? No.  Actions: * If pain score is 4 or above: No action needed, pain <4.  1. Have you developed a fever since your procedure? no  2.   Have you had an respiratory symptoms (SOB or cough) since your procedure? no  3.   Have you tested positive for COVID 19 since your procedure no  4.   Have you had any family members/close contacts diagnosed with the COVID 19 since your procedure? no   If yes to any of these questions please route to Joylene John, RN and Joella Prince, RN

## 2020-03-15 ENCOUNTER — Other Ambulatory Visit: Payer: Self-pay | Admitting: Internal Medicine

## 2020-03-16 ENCOUNTER — Encounter: Payer: Self-pay | Admitting: Gastroenterology

## 2020-06-05 ENCOUNTER — Other Ambulatory Visit: Payer: Self-pay | Admitting: Internal Medicine

## 2020-06-24 ENCOUNTER — Other Ambulatory Visit: Payer: Self-pay

## 2020-06-24 ENCOUNTER — Encounter: Payer: Self-pay | Admitting: Podiatry

## 2020-06-24 ENCOUNTER — Ambulatory Visit (INDEPENDENT_AMBULATORY_CARE_PROVIDER_SITE_OTHER): Payer: No Typology Code available for payment source

## 2020-06-24 ENCOUNTER — Ambulatory Visit (INDEPENDENT_AMBULATORY_CARE_PROVIDER_SITE_OTHER): Payer: No Typology Code available for payment source | Admitting: Podiatry

## 2020-06-24 DIAGNOSIS — S90111A Contusion of right great toe without damage to nail, initial encounter: Secondary | ICD-10-CM

## 2020-06-24 NOTE — Progress Notes (Signed)
  Subjective:  Patient ID: Marilyn [name], female    DOB: Mar 02, 1955,  MRN: [id number]  Chief Complaint  Patient presents with  . Toe Pain    Right great toe pain     65 y.o. female presents with the above complaint. History confirmed with patient. She stubbed it on a large plastic item playing with her dog on April 9th and kicked it. Previous bunion surgery this foot in 2011  Objective:  Physical Exam: warm, good capillary refill, no trophic changes or ulcerative lesions, normal DP and PT pulses and normal sensory exam.   Right Foot: mild pain with ROM of IPJ, minimal over distal and proximal phlaanx  Radiographs: X-ray of the right foot: arthritic changes of MTPJ and IPJ, k wires in place Assessment:   1. Contusion of right great toe without damage to nail, initial encounter      Plan:  Patient was evaluated and treated and all questions answered.  Suspect she has a contusion exacerbating developing osteoarthritis. Reviewed RICE, should resolve in 4-5 weeks, normal supportive shoe gear. Recommended voltaren gel for this. Return if still painful for steroid injection   Return if symptoms worsen or fail to improve.

## 2020-06-24 NOTE — Patient Instructions (Signed)
Look for Voltaren gel at the pharmacy over the counter or online (also known as diclofenac 1% gel). Apply to the painful areas 3-4x daily with the supplied dosing card. Allow to dry for 10 minutes before going into socks/shoes  

## 2020-07-27 ENCOUNTER — Encounter: Payer: Self-pay | Admitting: Emergency Medicine

## 2020-07-27 ENCOUNTER — Other Ambulatory Visit: Payer: Self-pay

## 2020-07-27 ENCOUNTER — Ambulatory Visit
Admission: EM | Admit: 2020-07-27 | Discharge: 2020-07-27 | Disposition: A | Discharge status: Home / Self Care | Payer: No Typology Code available for payment source | Attending: Family Medicine | Admitting: Family Medicine

## 2020-07-27 DIAGNOSIS — J069 Acute upper respiratory infection, unspecified: Secondary | ICD-10-CM | POA: Diagnosis not present

## 2020-07-27 DIAGNOSIS — Z20822 Contact with and (suspected) exposure to covid-19: Secondary | ICD-10-CM

## 2020-07-27 MED ORDER — IPRATROPIUM BROMIDE 0.03 % NA SOLN
2.0000 | Freq: Two times a day (BID) | NASAL | 0 refills | Status: DC | PRN
Start: 2020-07-27 — End: 2023-03-28

## 2020-07-27 MED ORDER — PROMETHAZINE-DM 6.25-15 MG/5ML PO SYRP
5.0000 mL | ORAL_SOLUTION | Freq: Four times a day (QID) | ORAL | 0 refills | Status: DC | PRN
Start: 2020-07-27 — End: 2020-08-26

## 2020-07-27 MED ORDER — PREDNISONE 10 MG PO TABS
10.0000 mg | ORAL_TABLET | Freq: Every day | ORAL | 0 refills | Status: AC
Start: 2020-07-27 — End: 2020-08-01

## 2020-07-27 NOTE — ED Triage Notes (Signed)
Pt is present today with a sore throat, cough, slight fever, and chills that started saturday. Pt has tried OTC medication and nothing has relieved her sx

## 2020-07-27 NOTE — ED Provider Notes (Signed)
[facility]    CSN: [id number] Arrival date & time: 07/27/20  0913      History   Chief Complaint Chief Complaint  Patient presents with  . Cough  . Sore Throat    HPI Marilyn [name] is a 65 y.o. female.   HPI  Patient presents today with 5 days of cough, fever, sore throat, nasal congestion and nasal drainage and facial pressure.  Patient is unaware of any known exposure to COVID or flu however has been around her grandchildren who attend daycare.  She has been taking over-the-counter medication without any relief.  She denies any severe shortness of breath however endorses with activity take an extra effort to breathe more effectively.  She has a history of COPD and a history of sarcoidosis.  Past Medical History:  Diagnosis Date  . Allergy   . Anemia    hx  . Anxiety    panic disorder  . Breast cancer ([place]) 06/06/2011   bc  left breast 3 o'clock dx=invasive ductal ca uoqER/PR=positive  . Cancer ([place])    BREAST - left  . Cigarette smoker   . Colon polyp   . Complication of anesthesia    DIFFICULTY AWAKENING, BP INCREASE AFTER CHOLECYSTECTEMY   . COPD (chronic obstructive pulmonary disease) ([place])   . Depression   . Diverticulosis of colon   . DJD (degenerative joint disease)   . DJD (degenerative joint disease)   . Family history of breast cancer   . Family history of colon cancer   . Family history of pancreatic cancer   . Family history of prostate cancer   . Gallstones   . GERD (gastroesophageal reflux disease)    Pt. denies having GERD. Unable to remove it.  Marland Kitchen Headache(784.0)   . History of anemia   . History of bronchitis   . History of sarcoidosis   . Hyperlipidemia    no meds needed  . Incisional breast wound    AT AGE 11 BILATERAL INCISION TO BREAST MADE  . Leg swelling   . Panic disorder   . Personal history of radiation therapy   . S/P radiation therapy 08/17/11 - 09/28/11   LLQ - 50 Gy/25 Fractions with Boost of 10 Gy / 5  fractions  . Sialoadenitis of submandibular gland 2016  . Vitamin D deficiency     Patient Active Problem List   Diagnosis Date Noted  . COPD exacerbation ([place]) 09/30/2019  . Knee pain, left 08/28/2019  . Elevated BP without diagnosis of hypertension 08/28/2019  . Callus of foot 08/28/2019  . Cholelithiasis 12/26/2018  . Genetic testing 12/26/2017  . Family history of breast cancer   . Family history of pancreatic cancer   . Family history of colon cancer   . Abdominal pain 07/28/2016  . Smoker 05/08/2016  . Obesity 03/13/2016  . Dyspnea 11/09/2015  . Well adult exam 07/23/2014  . Sialoadenitis 06/02/2014  . Edema 07/07/2013  . AB (asthmatic bronchitis) 06/23/2013  . Anxiety   . Malignant neoplasm of upper-outer quadrant of left breast in female, estrogen receptor positive ([place]) 06/09/2011  . COLONIC POLYPS 01/25/2010  . DIVERTICULOSIS OF COLON 01/25/2010  . DEGENERATIVE JOINT DISEASE 11/06/2008  . PANIC DISORDER 07/23/2008  . Sarcoidosis 10/13/2007  . Vitamin D deficiency 10/13/2007  . ANEMIA 10/13/2007  . CIGARETTE SMOKER 10/13/2007  . BRONCHITIS 10/13/2007  . Dyslipidemia 04/23/2007  . Adjustment disorder with mixed anxiety and depressed mood 04/23/2007  . Venous (peripheral) insufficiency 04/23/2007  .  HEADACHE 04/23/2007    Past Surgical History:  Procedure Laterality Date  . BIOPSY BREAST     left breast  as a teenager  benign  . BREAST BIOPSY Left 06/06/2011  . BREAST EXCISIONAL BIOPSY Bilateral   . BREAST LUMPECTOMY Left 2013  . BREAST SURGERY  07/05/11   left breast lumpectomy with needle loc & axillary sln bx  . BUNIONECTOMY  2011   bilateral by Dr Little Ishikawa  . CHOLECYSTECTOMY  02/2019  . COLONOSCOPY     polyp  . cyst removed from right hand  1995  . POLYPECTOMY    . TONSILLECTOMY  1972  . TOTAL ABDOMINAL HYSTERECTOMY  2000   Dr Nori Riis  . TRIGGER FINGER RELEASE  2008   Dr Burney Gauze    OB History   No obstetric history on file.    Obstetric  Comments  Menarche age 9, nulliparity, HRT x 1 year, Hysterectomy         Home Medications    Prior to Admission medications   Medication Sig Start Date End Date Taking? Authorizing Provider  albuterol (VENTOLIN HFA) 108 (90 Base) MCG/ACT inhaler Inhale 2 puffs into the lungs every 6 (six) hours as needed for wheezing or shortness of breath. 12/25/19   Plotnikov, Evie Lacks, MD  budesonide-formoterol (SYMBICORT) 80-4.5 MCG/ACT inhaler Inhale 2 puffs into the lungs 2 (two) times daily. 12/25/19   Plotnikov, Evie Lacks, MD  busPIRone (BUSPAR) 30 MG tablet TAKE 1/2 TO 1 (ONE-HALF TO ONE) TABLET BY MOUTH TWICE DAILY 06/07/20   Plotnikov, Evie Lacks, MD  celecoxib (CELEBREX) 200 MG capsule Take 1 capsule (200 mg total) by mouth daily as needed for moderate pain. 08/28/19   Plotnikov, Evie Lacks, MD  Cholecalciferol 5000 UNITS TABS Take 1 tablet by mouth 3 (three) times a week.    [provider]  clobetasol cream (TEMOVATE) 8.18 % Apply 1 application topically 2 (two) times daily. Patient taking differently: Apply 1 application topically 2 (two) times daily as needed. 09/13/17   Plotnikov, Evie Lacks, MD  clonazePAM (KLONOPIN) 0.5 MG tablet TAKE 1/2 TO 1 TABLET BY MOUTH TWICE A DAY AS NEEDED 09/18/18   Plotnikov, Evie Lacks, MD  diclofenac Sodium (VOLTAREN) 1 % GEL Apply 4 g topically 4 (four) times daily. 08/11/19   LampteyMyrene Galas, MD  Multiple Vitamin (MULTIVITAMIN WITH MINERALS) TABS tablet Take 1 tablet by mouth daily.    [provider]  pantoprazole (PROTONIX) 40 MG tablet Take 1 tablet (40 mg total) by mouth daily. 04/28/19 08/11/19  Plotnikov, Evie Lacks, MD    Family History Family History  Problem Relation Age of Onset  . Anemia Mother   . Other Mother        + H Pylori  . Seizures Mother   . Prostate cancer Father        dx over 53  . Colon cancer Father 2       diagnosed 2009  . Bladder Cancer Father        dx over 77  . Colon polyps Father   . Breast cancer Sister         dx in her 25s; mat half sister  . Pancreatic cancer Sister 65       d. 18  . Lung cancer Sister   . Sarcoidosis Sister        mat 1/2 sister  . Multiple sclerosis Brother        #2  . Hypertension Brother        #  1  . Hyperlipidemia Brother   . Prostate cancer Brother        dx under 62  . Breast cancer Cousin        pat first cousin d. 25  . Dementia Maternal Aunt   . Seizures Maternal Uncle   . Lung cancer Paternal Aunt   . Brain cancer Other        benign  . Dementia Maternal Aunt   . Lung cancer Maternal Uncle   . Sarcoidosis Cousin        mat first cousin  . Breast cancer Cousin        maternal 2nd cousins - distant  . Esophageal cancer Neg Hx   . Stomach cancer Neg Hx   . Rectal cancer Neg Hx     Social History Social History   Tobacco Use  . Smoking status: Current Every Day Smoker    Packs/day: 0.50    Types: Cigarettes    Last attempt to quit: 04/16/2012    Years since quitting: 8.2  . Smokeless tobacco: Never Used  . Tobacco comment: STOPPED AND RESTARTED  Vaping Use  . Vaping Use: Never used  Substance Use Topics  . Alcohol use: Yes    Alcohol/week: 0.0 - 1.0 standard drinks    Comment: social  . Drug use: No     Allergies   Lexapro [escitalopram oxalate] and Penicillins   Review of Systems Review of Systems Pertinent negatives listed in HPI   Physical Exam Triage Vital Signs ED Triage Vitals  Enc Vitals Group     BP 07/27/20 1218 131/72     Pulse Rate 07/27/20 1218 96     Resp 07/27/20 1218 18     Temp 07/27/20 1218 99.6 F (37.6 C)     Temp Source 07/27/20 1218 Oral     SpO2 07/27/20 1218 94 %     Weight --      Height --      Head Circumference --      Peak Flow --      Pain Score 07/27/20 1215 0     Pain Loc --      Pain Edu? --      Excl. in [place]? --    No data found.  Updated Vital Signs BP 131/72   Pulse 96   Temp 99.6 F (37.6 C) (Oral)   Resp 18   SpO2 94%   Visual Acuity Right Eye Distance:   Left  Eye Distance:   Bilateral Distance:    Right Eye Near:   Left Eye Near:    Bilateral Near:     Physical Exam  General Appearance:    Alert, acutely ill-appearing, cooperative, no distress  HENT:   Normocephalic, ears normal, nares mucosal edema with congestion, rhinorrhea, oropharynx    Eyes:    PERRL, conjunctiva/corneas clear, EOM's intact       Lungs:     Clear to auscultation bilaterally, respirations unlabored  Heart:    Regular rate and rhythm  Neurologic:   Awake, alert, oriented x 3. No apparent focal neurological           defect.     UC Treatments / Results  Labs (all labs ordered are listed, but only abnormal results are displayed) Labs Reviewed - No data to display  EKG   Radiology No results found.  Procedures Procedures (including critical care time)  Medications Ordered in UC Medications - No data to display  Initial  Impression / Assessment and Plan / UC Course  I have reviewed the triage vital signs and the nursing notes.  Pertinent labs & imaging results that were available during my care of the patient were reviewed by me and considered in my medical decision making (see chart for details).    COVID/Flu test pending. Symptom management warranted only.  Manage fever with Tylenol and ibuprofen.  Nasal symptoms with over-the-counter antihistamines recommended.  Treatment per discharge medications/discharge instructions.  Red flags/ER precautions given. The most current CDC isolation/quarantine recommendation advised.  Final Clinical Impressions(s) / UC Diagnoses   Final diagnoses:  Viral upper respiratory illness  Encounter for laboratory testing for COVID-19 virus   Discharge Instructions   None    ED Prescriptions    Medication Sig Dispense Auth. Provider   promethazine-dextromethorphan (PROMETHAZINE-DM) 6.25-15 MG/5ML syrup Take 5 mLs by mouth 4 (four) times daily as needed for cough. 180 mL Scot Jun, FNP   ipratropium (ATROVENT) 0.03  % nasal spray Place 2 sprays into both nostrils 2 (two) times daily as needed for rhinitis. 30 mL Scot Jun, FNP   predniSONE (DELTASONE) 10 MG tablet Take 1 tablet (10 mg total) by mouth daily with breakfast for 5 days. 5 tablet Scot Jun, FNP     PDMP not reviewed this encounter.   Scot Jun, [place] 07/27/20 1243

## 2020-07-28 LAB — COVID-19, FLU A+B NAA
Influenza A, NAA: NOT DETECTED
Influenza B, NAA: NOT DETECTED
SARS-CoV-2, NAA: NOT DETECTED

## 2020-08-19 ENCOUNTER — Other Ambulatory Visit: Payer: Self-pay | Admitting: Internal Medicine

## 2020-08-19 DIAGNOSIS — Z1231 Encounter for screening mammogram for malignant neoplasm of breast: Secondary | ICD-10-CM

## 2020-08-26 ENCOUNTER — Emergency Department (HOSPITAL_BASED_OUTPATIENT_CLINIC_OR_DEPARTMENT_OTHER): Payer: No Typology Code available for payment source

## 2020-08-26 ENCOUNTER — Emergency Department (HOSPITAL_BASED_OUTPATIENT_CLINIC_OR_DEPARTMENT_OTHER)
Admission: EM | Admit: 2020-08-26 | Discharge: 2020-08-26 | Disposition: A | Discharge status: Home / Self Care | Payer: No Typology Code available for payment source | Attending: Emergency Medicine | Admitting: Emergency Medicine

## 2020-08-26 ENCOUNTER — Encounter (HOSPITAL_BASED_OUTPATIENT_CLINIC_OR_DEPARTMENT_OTHER): Payer: Self-pay | Admitting: *Deleted

## 2020-08-26 ENCOUNTER — Other Ambulatory Visit: Payer: Self-pay

## 2020-08-26 DIAGNOSIS — F17210 Nicotine dependence, cigarettes, uncomplicated: Secondary | ICD-10-CM | POA: Diagnosis not present

## 2020-08-26 DIAGNOSIS — J449 Chronic obstructive pulmonary disease, unspecified: Secondary | ICD-10-CM | POA: Insufficient documentation

## 2020-08-26 DIAGNOSIS — J069 Acute upper respiratory infection, unspecified: Secondary | ICD-10-CM | POA: Insufficient documentation

## 2020-08-26 DIAGNOSIS — R059 Cough, unspecified: Secondary | ICD-10-CM

## 2020-08-26 DIAGNOSIS — Z7951 Long term (current) use of inhaled steroids: Secondary | ICD-10-CM | POA: Diagnosis not present

## 2020-08-26 DIAGNOSIS — Z853 Personal history of malignant neoplasm of breast: Secondary | ICD-10-CM | POA: Diagnosis not present

## 2020-08-26 MED ORDER — PROMETHAZINE-DM 6.25-15 MG/5ML PO SYRP: 5.0000 mL | ORAL_SOLUTION | Freq: Four times a day (QID) | ORAL | 0 refills | Status: DC | PRN

## 2020-08-26 NOTE — ED Triage Notes (Signed)
C/o cough , congestion x 4 days

## 2020-08-26 NOTE — ED Provider Notes (Signed)
[facility] Provider Note  CSN: [id number] Arrival date & time: 08/26/20 1257    History Chief Complaint  Patient presents with   Cough    Marilyn [name] is a 65 y.o. female with history of allergies, COPD (quit smoking last week) and sarcoidosis, no longer on daily steroids reports she has had cough and congestion for the last 4 days, caught a cold from her husband. Has had some sputum, no fever. Feels SOB at times. She was seen at [facility] for similar about a month ago, got cough meds and steroids which helped her symptoms and she was feeling well until she got sick again earlier this week. She has not been to see Pulm in some time and has not had any issues with her sarcoidosis in years.    Past Medical History:  Diagnosis Date   Allergy    Anemia    hx   Anxiety    panic disorder   Breast cancer ([place]) 06/06/2011   bc  left breast 3 o'clock dx=invasive ductal ca uoqER/PR=positive   Cancer ([facility])    BREAST - left   Cigarette smoker    Colon polyp    Complication of anesthesia    DIFFICULTY AWAKENING, BP INCREASE AFTER CHOLECYSTECTEMY    COPD (chronic obstructive pulmonary disease) ([facility])    Depression    Diverticulosis of colon    DJD (degenerative joint disease)    DJD (degenerative joint disease)    Family history of breast cancer    Family history of colon cancer    Family history of pancreatic cancer    Family history of prostate cancer    Gallstones    GERD (gastroesophageal reflux disease)    Pt. denies having GERD. Unable to remove it.   Headache(784.0)    History of anemia    History of bronchitis    History of sarcoidosis    Hyperlipidemia    no meds needed   Incisional breast wound    AT AGE 70 BILATERAL INCISION TO BREAST MADE   Leg swelling    Panic disorder    Personal history of radiation therapy    S/P radiation therapy 08/17/11 - 09/28/11   LLQ - 50 Gy/25 Fractions with Boost of 10 Gy / 5 fractions   Sialoadenitis  of submandibular gland 2016   Vitamin D deficiency     Past Surgical History:  Procedure Laterality Date   BIOPSY BREAST     left breast  as a teenager  benign   BREAST BIOPSY Left 06/06/2011   BREAST EXCISIONAL BIOPSY Bilateral    BREAST LUMPECTOMY Left 2013   BREAST SURGERY  07/05/11   left breast lumpectomy with needle loc & axillary sln bx   BUNIONECTOMY  2011   bilateral by Dr Little Ishikawa   CHOLECYSTECTOMY  02/2019   COLONOSCOPY     polyp   cyst removed from right hand  [place]  2000   Dr Gae Gallop FINGER RELEASE  2008   Dr Burney Gauze    Family History  Problem Relation Age of Onset   Anemia Mother    Other Mother        + H Pylori   Seizures Mother    Prostate cancer Father        dx over 46   Colon cancer Father 15       diagnosed 2009  Bladder Cancer Father        dx over 26   Colon polyps Father    Breast cancer Sister        dx in her 80s; mat half sister   Pancreatic cancer Sister 46       d. 17   Lung cancer Sister    Sarcoidosis Sister        mat 1/2 sister   Multiple sclerosis Brother        #2   Hypertension Brother        #1   Hyperlipidemia Brother    Prostate cancer Brother        dx under 60   Breast cancer Cousin        pat first cousin d. 4   Dementia Maternal Aunt    Seizures Maternal Uncle    Lung cancer Paternal Aunt    Brain cancer Other        benign   Dementia Maternal Aunt    Lung cancer Maternal Uncle    Sarcoidosis Cousin        mat first cousin   Breast cancer Cousin        maternal 2nd cousins - distant   Esophageal cancer Neg Hx    Stomach cancer Neg Hx    Rectal cancer Neg Hx     Social History   Tobacco Use   Smoking status: Every Day    Packs/day: 0.50    Pack years: 0.00    Types: Cigarettes    Last attempt to quit: 04/16/2012    Years since quitting: 8.3   Smokeless tobacco: Never   Tobacco comments:    STOPPED AND RESTARTED  Vaping  Use   Vaping Use: Never used  Substance Use Topics   Alcohol use: Yes    Alcohol/week: 0.0 - 1.0 standard drinks    Comment: social   Drug use: No     Home Medications Prior to Admission medications   Medication Sig Start Date End Date Taking? Authorizing Provider  albuterol (VENTOLIN HFA) 108 (90 Base) MCG/ACT inhaler Inhale 2 puffs into the lungs every 6 (six) hours as needed for wheezing or shortness of breath. 12/25/19   Plotnikov, Evie Lacks, MD  budesonide-formoterol (SYMBICORT) 80-4.5 MCG/ACT inhaler Inhale 2 puffs into the lungs 2 (two) times daily. 12/25/19   Plotnikov, Evie Lacks, MD  busPIRone (BUSPAR) 30 MG tablet TAKE 1/2 TO 1 (ONE-HALF TO ONE) TABLET BY MOUTH TWICE DAILY 06/07/20   Plotnikov, Evie Lacks, MD  celecoxib (CELEBREX) 200 MG capsule Take 1 capsule (200 mg total) by mouth daily as needed for moderate pain. 08/28/19   Plotnikov, Evie Lacks, MD  Cholecalciferol 5000 UNITS TABS Take 1 tablet by mouth 3 (three) times a week.    [provider]  clobetasol cream (TEMOVATE) 6.29 % Apply 1 application topically 2 (two) times daily. Patient taking differently: Apply 1 application topically 2 (two) times daily as needed. 09/13/17   Plotnikov, Evie Lacks, MD  clonazePAM (KLONOPIN) 0.5 MG tablet TAKE 1/2 TO 1 TABLET BY MOUTH TWICE A DAY AS NEEDED 09/18/18   Plotnikov, Evie Lacks, MD  diclofenac Sodium (VOLTAREN) 1 % GEL Apply 4 g topically 4 (four) times daily. 08/11/19   Lamptey, Myrene Galas, MD  ipratropium (ATROVENT) 0.03 % nasal spray Place 2 sprays into both nostrils 2 (two) times daily as needed for rhinitis. 07/27/20   Scot Jun, FNP  Multiple Vitamin (MULTIVITAMIN WITH MINERALS) TABS tablet Take 1  tablet by mouth daily.    [provider]  promethazine-dextromethorphan (PROMETHAZINE-DM) 6.25-15 MG/5ML syrup Take 5 mLs by mouth 4 (four) times daily as needed for cough. 08/26/20   Truddie Hidden, MD  pantoprazole (PROTONIX) 40 MG tablet Take 1 tablet (40 mg  total) by mouth daily. 04/28/19 08/11/19  Plotnikov, Evie Lacks, MD     Allergies    Lexapro [escitalopram oxalate] and Penicillins   Review of Systems   Review of Systems A comprehensive review of systems was completed and negative except as noted in HPI.    Physical Exam BP 125/65   Pulse 99   Temp 98.4 F (36.9 C) (Oral)   Resp 16   Ht 5\' 5"  (1.651 m)   Wt 88.5 kg   SpO2 100%   BMI 32.45 kg/m   Physical Exam Vitals and nursing note reviewed.  Constitutional:      Appearance: Normal appearance.  HENT:     Head: Normocephalic and atraumatic.     Nose: Nose normal.     Mouth/Throat:     Mouth: Mucous membranes are moist.  Eyes:     Extraocular Movements: Extraocular movements intact.     Conjunctiva/sclera: Conjunctivae normal.  Cardiovascular:     Rate and Rhythm: Normal rate.  Pulmonary:     Effort: Pulmonary effort is normal. No respiratory distress.     Breath sounds: Normal breath sounds. No wheezing or rhonchi.  Abdominal:     General: Abdomen is flat.     Palpations: Abdomen is soft.     Tenderness: There is no abdominal tenderness.  Musculoskeletal:        General: No swelling. Normal range of motion.     Cervical back: Neck supple.  Skin:    General: Skin is warm and dry.  Neurological:     General: No focal deficit present.     Mental Status: She is alert.  Psychiatric:        Mood and Affect: Mood normal.     ED Results / Procedures / Treatments   Labs (all labs ordered are listed, but only abnormal results are displayed) Labs Reviewed - No data to display  EKG None   Radiology DG Chest [facility] 1 View  Result Date: 08/26/2020 CLINICAL DATA:  Cough, congestion and shortness of breath for 4 days of with a history of CPPD. EXAM: PORTABLE CHEST 1 VIEW COMPARISON:  September 18, 2019. FINDINGS: Trachea is midline. Cardiomediastinal contours and hilar structures are normal. Lungs are clear. No sign of lobar consolidation or evidence of pleural effusion.  No pneumothorax. On limited assessment no acute skeletal process. IMPRESSION: No acute cardiopulmonary disease. Electronically Signed   By: Zetta Bills M.D.   On: 08/26/2020 14:04    Procedures Procedures  Medications Ordered in the ED Medications - No data to display   MDM Rules/Calculators/A&P MDM  Patient with URI, exam is benign, vitals are normal. Will check CXR for signs of PNA or return of sarcoid.  ED Course  I have reviewed the triage vital signs and the nursing notes.  Pertinent labs & imaging results that were available during my care of the patient were reviewed by me and considered in my medical decision making (see chart for details).  Clinical Course as of 08/26/20 1501  Thu Aug 26, 2020  1454 Patient remains nontoxic appearing in no distress. CXR is clear. Plan discharge with refill of cough medication. Advised to use inhaler as needed and follow up with PCP.  [  CS]    Clinical Course User Index [CS] Truddie Hidden, MD    Final Clinical Impression(s) / ED Diagnoses Final diagnoses:  Viral URI with cough    Rx / DC Orders ED Discharge Orders          Ordered    promethazine-dextromethorphan (PROMETHAZINE-DM) 6.25-15 MG/5ML syrup  4 times daily PRN        08/26/20 1456             Truddie Hidden, MD 08/26/20 [id number]

## 2020-08-31 ENCOUNTER — Other Ambulatory Visit: Payer: Self-pay

## 2020-08-31 ENCOUNTER — Encounter: Payer: Self-pay | Admitting: Internal Medicine

## 2020-08-31 ENCOUNTER — Ambulatory Visit: Payer: No Typology Code available for payment source | Admitting: Internal Medicine

## 2020-08-31 DIAGNOSIS — Z720 Tobacco use: Secondary | ICD-10-CM

## 2020-08-31 DIAGNOSIS — Z6836 Body mass index (BMI) 36.0-36.9, adult: Secondary | ICD-10-CM

## 2020-08-31 DIAGNOSIS — J441 Chronic obstructive pulmonary disease with (acute) exacerbation: Secondary | ICD-10-CM | POA: Diagnosis not present

## 2020-08-31 DIAGNOSIS — D869 Sarcoidosis, unspecified: Secondary | ICD-10-CM | POA: Diagnosis not present

## 2020-08-31 MED ORDER — METHYLPREDNISOLONE 4 MG PO TBPK: ORAL_TABLET | ORAL | 0 refills | Status: DC

## 2020-08-31 MED ORDER — PROMETHAZINE-CODEINE 6.25-10 MG/5ML PO SYRP: 5.0000 mL | ORAL_SOLUTION | ORAL | 0 refills | Status: DC | PRN

## 2020-08-31 NOTE — Progress Notes (Signed)
Subjective:  Patient ID: Marilyn [name], female    DOB: 18-Apr-1955  Age: 65 y.o. MRN: [id number]  CC: Follow-up (ER Follow-up)   HPI Marilyn [name] presents for URI x 2 (beginning of June and the end of June). Pt went to ER on 6/30 - CXR was ok. Pt started Symbicort on 6/30, using a cough syrup. Some better...  Outpatient Medications Prior to Visit  Medication Sig Dispense Refill  . albuterol (VENTOLIN HFA) 108 (90 Base) MCG/ACT inhaler Inhale 2 puffs into the lungs every 6 (six) hours as needed for wheezing or shortness of breath. 18 g 11  . budesonide-formoterol (SYMBICORT) 80-4.5 MCG/ACT inhaler Inhale 2 puffs into the lungs 2 (two) times daily. 10 g 11  . busPIRone (BUSPAR) 30 MG tablet TAKE 1/2 TO 1 (ONE-HALF TO ONE) TABLET BY MOUTH TWICE DAILY 60 tablet 5  . celecoxib (CELEBREX) 200 MG capsule Take 1 capsule (200 mg total) by mouth daily as needed for moderate pain. 30 capsule 3  . Cholecalciferol 5000 UNITS TABS Take 1 tablet by mouth 3 (three) times a week.    . clobetasol cream (TEMOVATE) 1.06 % Apply 1 application topically 2 (two) times daily. (Patient taking differently: Apply 1 application topically 2 (two) times daily as needed.) 60 g 3  . clonazePAM (KLONOPIN) 0.5 MG tablet TAKE 1/2 TO 1 TABLET BY MOUTH TWICE A DAY AS NEEDED 60 tablet 2  . diclofenac Sodium (VOLTAREN) 1 % GEL Apply 4 g topically 4 (four) times daily.    Marland Kitchen ipratropium (ATROVENT) 0.03 % nasal spray Place 2 sprays into both nostrils 2 (two) times daily as needed for rhinitis. 30 mL 0  . Multiple Vitamin (MULTIVITAMIN WITH MINERALS) TABS tablet Take 1 tablet by mouth daily.    . promethazine-dextromethorphan (PROMETHAZINE-DM) 6.25-15 MG/5ML syrup Take 5 mLs by mouth 4 (four) times daily as needed for cough. 180 mL 0   No facility-administered medications prior to visit.    ROS: Review of Systems  Constitutional:  Negative for activity change, appetite change, chills, fatigue and unexpected  weight change.  HENT:  Negative for congestion, mouth sores and sinus pressure.   Eyes:  Negative for visual disturbance.  Respiratory:  Positive for cough. Negative for chest tightness, shortness of breath and wheezing.   Gastrointestinal:  Negative for abdominal pain and nausea.  Genitourinary:  Negative for difficulty urinating, frequency and vaginal pain.  Musculoskeletal:  Negative for back pain and gait problem.  Skin:  Negative for pallor and rash.  Neurological:  Negative for dizziness, tremors, weakness, numbness and headaches.  Psychiatric/Behavioral:  Negative for confusion, sleep disturbance and suicidal ideas.    Objective:  BP (!) 152/80 (BP Location: Left Arm)   Pulse 70   Temp 98.5 F (36.9 C) (Oral)   Ht 5\' 5"  (1.651 m)   Wt 197 lb 12.8 oz (89.7 kg)   SpO2 98%   BMI 32.92 kg/m   BP Readings from Last 3 Encounters:  08/31/20 (!) 152/80  08/26/20 125/65  07/27/20 131/72    Wt Readings from Last 3 Encounters:  08/31/20 197 lb 12.8 oz (89.7 kg)  08/26/20 195 lb (88.5 kg)  03/03/20 196 lb (88.9 kg)    Physical Exam Constitutional:      General: She is not in acute distress.    Appearance: She is well-developed. She is obese.  HENT:     Head: Normocephalic.     Right Ear: External ear normal.     Left Ear:  External ear normal.     Nose: Nose normal.  Eyes:     General:        Right eye: No discharge.        Left eye: No discharge.     Conjunctiva/sclera: Conjunctivae normal.     Pupils: Pupils are equal, round, and reactive to light.  Neck:     Thyroid: No thyromegaly.     Vascular: No JVD.     Trachea: No tracheal deviation.  Cardiovascular:     Rate and Rhythm: Normal rate and regular rhythm.     Heart sounds: Normal heart sounds.  Pulmonary:     Effort: No respiratory distress.     Breath sounds: No stridor. No wheezing.  Abdominal:     General: Bowel sounds are normal. There is no distension.     Palpations: Abdomen is soft. There is no  mass.     Tenderness: There is no abdominal tenderness. There is no guarding or rebound.  Musculoskeletal:        General: No tenderness.     Cervical back: Normal range of motion and neck supple. No rigidity.  Lymphadenopathy:     Cervical: No cervical adenopathy.  Skin:    Findings: No erythema or rash.  Neurological:     Cranial Nerves: No cranial nerve deficit.     Motor: No abnormal muscle tone.     Coordination: Coordination normal.     Deep Tendon Reflexes: Reflexes normal.  Psychiatric:        Behavior: Behavior normal.        Thought Content: Thought content normal.        Judgment: Judgment normal.    Lab Results  Component Value Date   WBC 8.2 12/25/2019   HGB 11.9 (L) 12/25/2019   HCT 37.5 12/25/2019   PLT 198.0 12/25/2019   GLUCOSE 88 12/25/2019   CHOL 214 (H) 12/25/2019   TRIG 72.0 12/25/2019   HDL 80.30 12/25/2019   LDLDIRECT 118.9 12/18/2011   LDLCALC 119 (H) 12/25/2019   ALT 10 12/25/2019   AST 12 12/25/2019   NA 140 12/25/2019   K 4.2 12/25/2019   CL 105 12/25/2019   CREATININE 0.74 12/25/2019   BUN 11 12/25/2019   CO2 30 12/25/2019   TSH 1.43 12/25/2019    DG Chest Port 1 View  Result Date: 08/26/2020 CLINICAL DATA:  Cough, congestion and shortness of breath for 4 days of with a history of CPPD. EXAM: PORTABLE CHEST 1 VIEW COMPARISON:  September 18, 2019. FINDINGS: Trachea is midline. Cardiomediastinal contours and hilar structures are normal. Lungs are clear. No sign of lobar consolidation or evidence of pleural effusion. No pneumothorax. On limited assessment no acute skeletal process. IMPRESSION: No acute cardiopulmonary disease. Electronically Signed   By: Zetta Bills M.D.   On: 08/26/2020 14:04    Assessment & Plan:     Walker Kehr, MD

## 2020-08-31 NOTE — Assessment & Plan Note (Addendum)
Prom - cod syr Rx Start Medrol pack Continue with Symbicort daily

## 2020-09-01 NOTE — Assessment & Plan Note (Signed)
  Wt Readings from Last 3 Encounters:  08/31/20 197 lb 12.8 oz (89.7 kg)  08/26/20 195 lb (88.5 kg)  03/03/20 196 lb (88.9 kg)  No weight loss recently

## 2020-09-01 NOTE — Assessment & Plan Note (Signed)
The patient tells me that her chest x-ray a month ago was okay

## 2020-09-01 NOTE — Assessment & Plan Note (Signed)
We discussed the need to discontinue smoking

## 2020-09-14 IMAGING — DX DG CHEST 2V
2 series · 2 of 2 positions shown · non-contrast
Comparison: 03/12/2019

CLINICAL DATA: Cough, shortness of breath, and fever. History of
COPD and sarcoidosis.

EXAM:
CHEST - 2 VIEW

[chest pa]
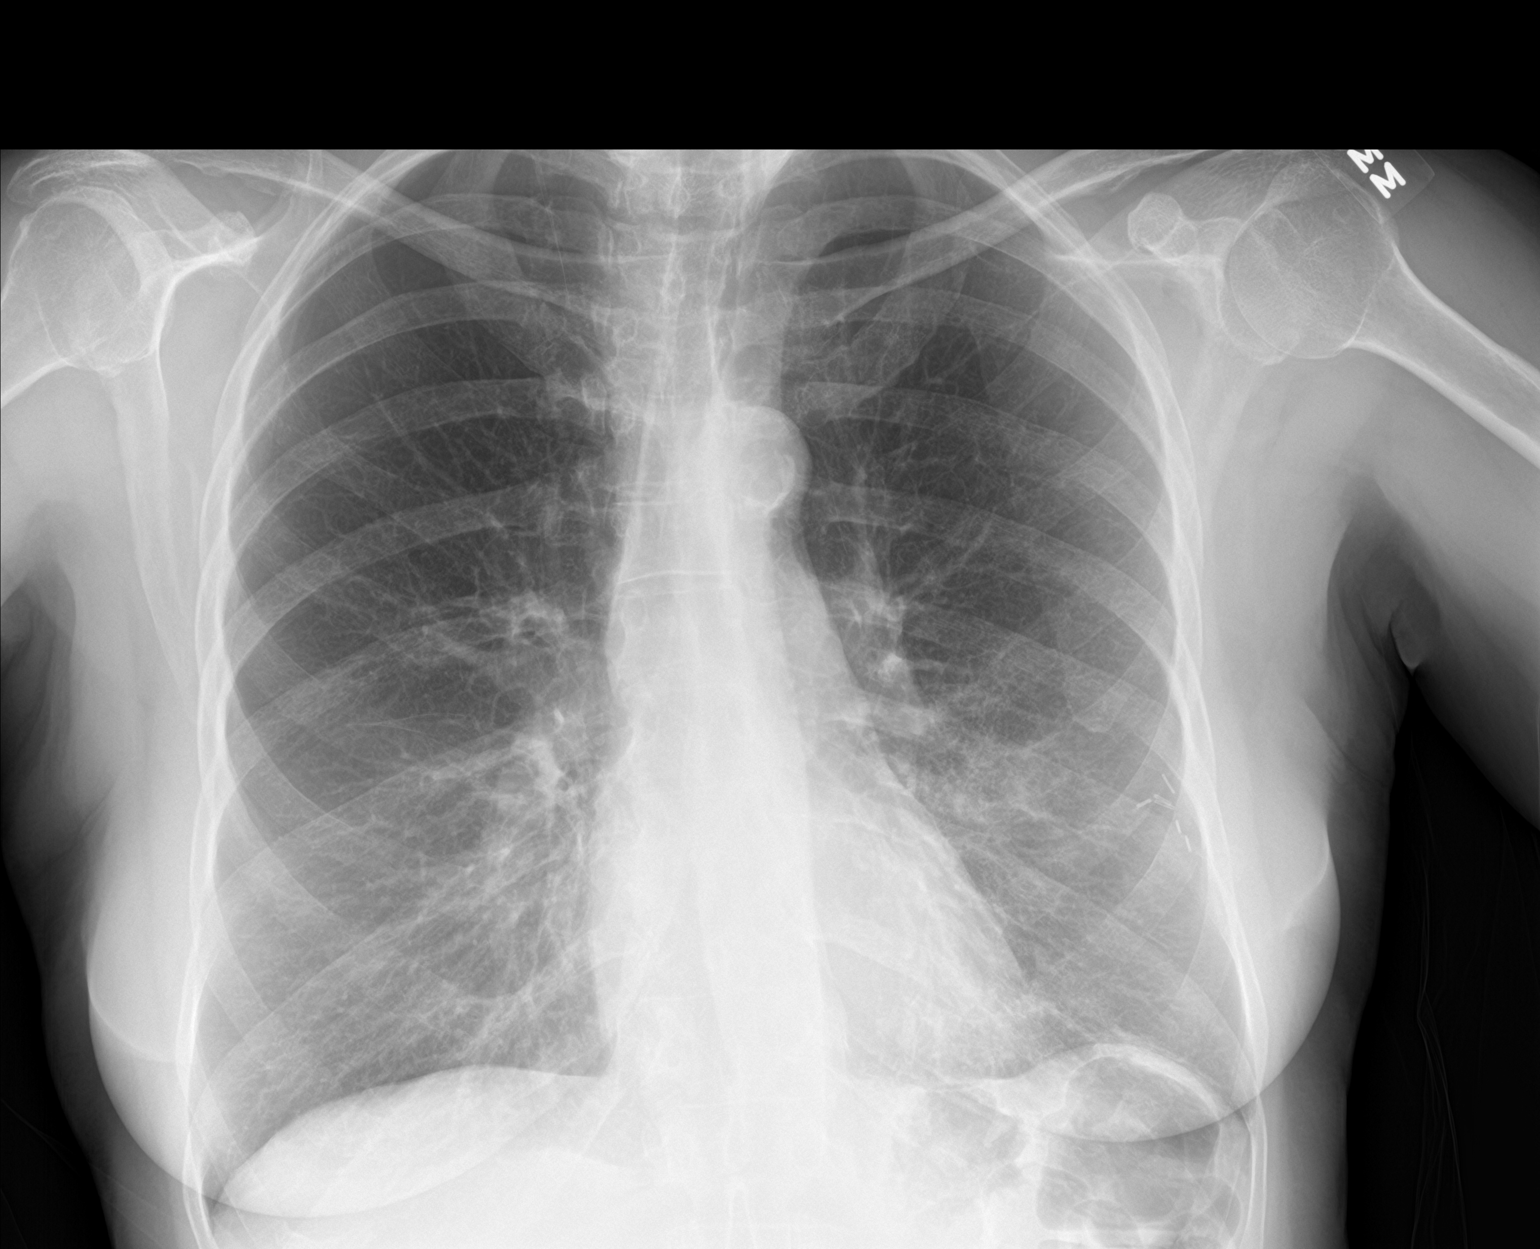

[chest lat]
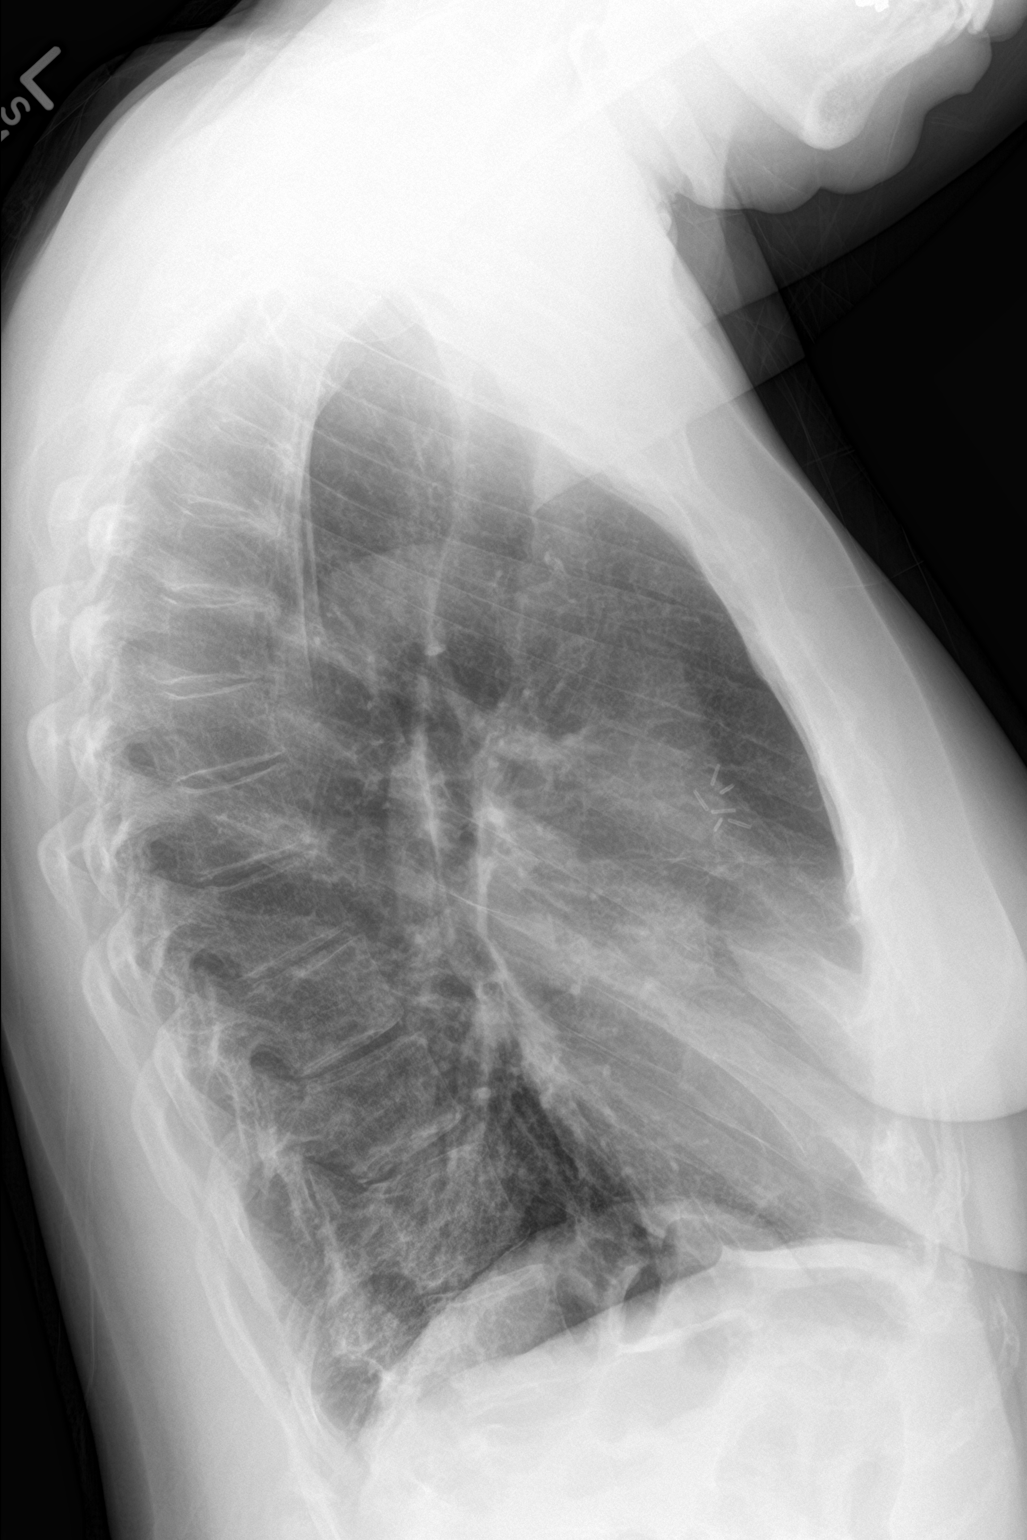

[2 of 2 positions shown; findings below may reference images not displayed]

FINDINGS: The cardiomediastinal silhouette is unchanged. Aortic
atherosclerosis is noted. The lungs remain hyperinflated with
similar appearance of mild chronic interstitial coarsening. No
airspace consolidation, edema, pleural effusion, pneumothorax is
identified. Surgical clips project over the left breast. No acute
osseous abnormality is seen.
IMPRESSION: No active cardiopulmonary disease.

## 2020-10-13 ENCOUNTER — Other Ambulatory Visit: Payer: Self-pay

## 2020-10-13 ENCOUNTER — Ambulatory Visit
Admission: RE | Admit: 2020-10-13 | Discharge: 2020-10-13 | Disposition: A | Discharge status: Home / Self Care | Payer: No Typology Code available for payment source | Source: Ambulatory Visit | Attending: Internal Medicine | Admitting: Internal Medicine

## 2020-10-13 DIAGNOSIS — Z1231 Encounter for screening mammogram for malignant neoplasm of breast: Secondary | ICD-10-CM

## 2020-12-08 ENCOUNTER — Ambulatory Visit: Payer: No Typology Code available for payment source

## 2020-12-27 ENCOUNTER — Other Ambulatory Visit: Payer: Self-pay | Admitting: Internal Medicine

## 2020-12-28 ENCOUNTER — Other Ambulatory Visit: Payer: Self-pay

## 2020-12-28 ENCOUNTER — Encounter: Payer: Self-pay | Admitting: Internal Medicine

## 2020-12-28 ENCOUNTER — Ambulatory Visit (INDEPENDENT_AMBULATORY_CARE_PROVIDER_SITE_OTHER): Payer: No Typology Code available for payment source | Admitting: Internal Medicine

## 2020-12-28 VITALS — BP 140/84 | HR 71 | Temp 98.1°F | Ht 65.0 in | Wt 204.0 lb

## 2020-12-28 DIAGNOSIS — F17200 Nicotine dependence, unspecified, uncomplicated: Secondary | ICD-10-CM | POA: Diagnosis not present

## 2020-12-28 DIAGNOSIS — J449 Chronic obstructive pulmonary disease, unspecified: Secondary | ICD-10-CM | POA: Diagnosis not present

## 2020-12-28 DIAGNOSIS — Z0000 Encounter for general adult medical examination without abnormal findings: Secondary | ICD-10-CM | POA: Diagnosis not present

## 2020-12-28 DIAGNOSIS — E785 Hyperlipidemia, unspecified: Secondary | ICD-10-CM | POA: Diagnosis not present

## 2020-12-28 DIAGNOSIS — F4323 Adjustment disorder with mixed anxiety and depressed mood: Secondary | ICD-10-CM | POA: Diagnosis not present

## 2020-12-28 LAB — CBC WITH DIFFERENTIAL/PLATELET
Basophils Absolute: 0.1 10*3/uL (ref 0.0–0.1)
Basophils Relative: 0.7 % (ref 0.0–3.0)
Eosinophils Absolute: 0.3 10*3/uL (ref 0.0–0.7)
Eosinophils Relative: 3.5 % (ref 0.0–5.0)
HCT: 39.1 % (ref 36.0–46.0)
Hemoglobin: 12.4 g/dL (ref 12.0–15.0)
Lymphocytes Relative: 22 % (ref 12.0–46.0)
Lymphs Abs: 1.6 10*3/uL (ref 0.7–4.0)
MCHC: 31.6 g/dL (ref 30.0–36.0)
MCV: 75.5 fl — ABNORMAL LOW (ref 78.0–100.0)
Monocytes Absolute: 0.5 10*3/uL (ref 0.1–1.0)
Monocytes Relative: 7.2 % (ref 3.0–12.0)
Neutro Abs: 4.9 10*3/uL (ref 1.4–7.7)
Neutrophils Relative %: 66.6 % (ref 43.0–77.0)
Platelets: 184 10*3/uL (ref 150.0–400.0)
RBC: 5.18 Mil/uL — ABNORMAL HIGH (ref 3.87–5.11)
RDW: 14.1 % (ref 11.5–15.5)
WBC: 7.3 10*3/uL (ref 4.0–10.5)

## 2020-12-28 LAB — COMPREHENSIVE METABOLIC PANEL
ALT: 11 U/L (ref 0–35)
AST: 14 U/L (ref 0–37)
Albumin: 4.1 g/dL (ref 3.5–5.2)
Alkaline Phosphatase: 74 U/L (ref 39–117)
BUN: 11 mg/dL (ref 6–23)
CO2: 28 mEq/L (ref 19–32)
Calcium: 9.6 mg/dL (ref 8.4–10.5)
Chloride: 105 mEq/L (ref 96–112)
Creatinine, Ser: 0.74 mg/dL (ref 0.40–1.20)
GFR: 84.07 mL/min (ref >60.00)
Glucose, Bld: 93 mg/dL (ref 70–99)
Potassium: 4.5 mEq/L (ref 3.5–5.1)
Sodium: 139 mEq/L (ref 135–145)
Total Bilirubin: 0.4 mg/dL (ref 0.2–1.2)
Total Protein: 7 g/dL (ref 6.0–8.3)

## 2020-12-28 LAB — LIPID PANEL
Cholesterol: 209 mg/dL — ABNORMAL HIGH (ref 0–200)
HDL: 69.2 mg/dL (ref >39.00)
LDL Cholesterol: 126 mg/dL — ABNORMAL HIGH (ref 0–99)
NonHDL: 139.94
Total CHOL/HDL Ratio: 3
Triglycerides: 70 mg/dL (ref 0.0–149.0)
VLDL: 14 mg/dL (ref 0.0–40.0)

## 2020-12-28 LAB — URINALYSIS
Bilirubin Urine: NEGATIVE
Ketones, ur: NEGATIVE
Leukocytes,Ua: NEGATIVE
Nitrite: NEGATIVE
Specific Gravity, Urine: 1.025 (ref 1.000–1.030)
Total Protein, Urine: NEGATIVE
Urine Glucose: NEGATIVE
Urobilinogen, UA: 0.2 (ref 0.0–1.0)
pH: 6 (ref 5.0–8.0)

## 2020-12-28 LAB — TSH: TSH: 1.25 u[IU]/mL (ref 0.35–5.50)

## 2020-12-28 NOTE — Addendum Note (Signed)
Addended by: Jacobo Forest on: 12/28/2020 11:38 AM   Modules accepted: Orders

## 2020-12-28 NOTE — Assessment & Plan Note (Signed)
On Buspar, Clonazepam  Potential benefits of a long term benzodiazepines  use as well as potential risks  and complications were explained to the patient and were aknowledged.

## 2020-12-28 NOTE — Assessment & Plan Note (Signed)
We discussed age appropriate health related issues, including available/recomended screening tests and vaccinations. We discussed a need for adhering to healthy diet and exercise. Labs were ordered to be later reviewed . All questions were answered. Trying to quit smoking [place] 2016,  2021 due in 02/2025 - Dr Silverio Decamp Pneumovax Ordered  calc score CT 10/21 did not have it

## 2020-12-28 NOTE — Assessment & Plan Note (Signed)
Smoking discussed 

## 2020-12-28 NOTE — Progress Notes (Signed)
Subjective:  Patient ID: Marilyn [name], female    DOB: 1955-04-10  Age: 65 y.o. MRN: [id number]  CC: Annual Exam and Medication Refill (Clonazepam & Buspar)   HPI Marilyn [name] presents for a well exam    Outpatient Medications Prior to Visit  Medication Sig Dispense Refill   albuterol (VENTOLIN HFA) 108 (90 Base) MCG/ACT inhaler Inhale 2 puffs into the lungs every 6 (six) hours as needed for wheezing or shortness of breath. 18 g 11   budesonide-formoterol (SYMBICORT) 80-4.5 MCG/ACT inhaler TAKE 2 PUFFS BY MOUTH TWICE A DAY 10.2 each 11   busPIRone (BUSPAR) 30 MG tablet TAKE 1/2 TO 1 (ONE-HALF TO ONE) TABLET BY MOUTH TWICE DAILY 60 tablet 5   celecoxib (CELEBREX) 200 MG capsule Take 1 capsule (200 mg total) by mouth daily as needed for moderate pain. 30 capsule 3   Cholecalciferol 5000 UNITS TABS Take 1 tablet by mouth 3 (three) times a week.     clobetasol cream (TEMOVATE) 5.42 % Apply 1 application topically 2 (two) times daily. (Patient taking differently: Apply 1 application topically 2 (two) times daily as needed.) 60 g 3   clonazePAM (KLONOPIN) 0.5 MG tablet TAKE 1/2 TO 1 TABLET BY MOUTH TWICE A DAY AS NEEDED 60 tablet 2   diclofenac Sodium (VOLTAREN) 1 % GEL Apply 4 g topically 4 (four) times daily.     ipratropium (ATROVENT) 0.03 % nasal spray Place 2 sprays into both nostrils 2 (two) times daily as needed for rhinitis. 30 mL 0   Multiple Vitamin (MULTIVITAMIN WITH MINERALS) TABS tablet Take 1 tablet by mouth daily.     methylPREDNISolone (MEDROL DOSEPAK) 4 MG TBPK tablet As directed (Patient not taking: Reported on 12/28/2020) 21 tablet 0   promethazine-codeine (PHENERGAN WITH CODEINE) 6.25-10 MG/5ML syrup Take 5 mLs by mouth every 4 (four) hours as needed. 300 mL 0   No facility-administered medications prior to visit.    ROS: Review of Systems  Constitutional:  Positive for unexpected weight change. Negative for activity change, appetite change, chills  and fatigue.  HENT:  Negative for congestion, mouth sores and sinus pressure.   Eyes:  Negative for visual disturbance.  Respiratory:  Negative for cough and chest tightness.   Gastrointestinal:  Negative for abdominal pain and nausea.  Genitourinary:  Negative for difficulty urinating, frequency and vaginal pain.  Musculoskeletal:  Negative for back pain and gait problem.  Skin:  Negative for pallor and rash.  Neurological:  Negative for dizziness, tremors, weakness, numbness and headaches.  Psychiatric/Behavioral:  Positive for decreased concentration. Negative for confusion, sleep disturbance and suicidal ideas. The patient is nervous/anxious.    Objective:  BP 140/84 (BP Location: Left Arm)   Pulse 71   Temp 98.1 F (36.7 C) (Oral)   Ht 5\' 5"  (1.651 m)   Wt 204 lb (92.5 kg)   SpO2 97%   BMI 33.95 kg/m   BP Readings from Last 3 Encounters:  12/28/20 140/84  08/31/20 (!) 152/80  08/26/20 125/65    Wt Readings from Last 3 Encounters:  12/28/20 204 lb (92.5 kg)  08/31/20 197 lb 12.8 oz (89.7 kg)  08/26/20 195 lb (88.5 kg)    Physical Exam Constitutional:      General: She is not in acute distress.    Appearance: She is well-developed. She is obese.  HENT:     Head: Normocephalic.     Right Ear: External ear normal.     Left Ear: External ear normal.  Nose: Nose normal.  Eyes:     General:        Right eye: No discharge.        Left eye: No discharge.     Conjunctiva/sclera: Conjunctivae normal.     Pupils: Pupils are equal, round, and reactive to light.  Neck:     Thyroid: No thyromegaly.     Vascular: No JVD.     Trachea: No tracheal deviation.  Cardiovascular:     Rate and Rhythm: Normal rate and regular rhythm.     Heart sounds: Normal heart sounds.  Pulmonary:     Effort: No respiratory distress.     Breath sounds: No stridor. No wheezing.  Abdominal:     General: Bowel sounds are normal. There is no distension.     Palpations: Abdomen is soft.  There is no mass.     Tenderness: There is no abdominal tenderness. There is no guarding or rebound.  Musculoskeletal:        General: No tenderness.     Cervical back: Normal range of motion and neck supple. No rigidity.  Lymphadenopathy:     Cervical: No cervical adenopathy.  Skin:    Findings: No erythema or rash.  Neurological:     Mental Status: She is oriented to person, place, and time.     Cranial Nerves: No cranial nerve deficit.     Motor: No abnormal muscle tone.     Coordination: Coordination normal.     Deep Tendon Reflexes: Reflexes normal.  Psychiatric:        Behavior: Behavior normal.        Thought Content: Thought content normal.        Judgment: Judgment normal.    Lab Results  Component Value Date   WBC 8.2 12/25/2019   HGB 11.9 (L) 12/25/2019   HCT 37.5 12/25/2019   PLT 198.0 12/25/2019   GLUCOSE 88 12/25/2019   CHOL 214 (H) 12/25/2019   TRIG 72.0 12/25/2019   HDL 80.30 12/25/2019   LDLDIRECT 118.9 12/18/2011   LDLCALC 119 (H) 12/25/2019   ALT 10 12/25/2019   AST 12 12/25/2019   NA 140 12/25/2019   K 4.2 12/25/2019   CL 105 12/25/2019   CREATININE 0.74 12/25/2019   BUN 11 12/25/2019   CO2 30 12/25/2019   TSH 1.43 12/25/2019    MM 3D SCREEN BREAST BILATERAL  Result Date: 10/14/2020 CLINICAL DATA:  Screening. EXAM: DIGITAL SCREENING BILATERAL MAMMOGRAM WITH TOMOSYNTHESIS AND CAD TECHNIQUE: Bilateral screening digital craniocaudal and mediolateral oblique mammograms were obtained. Bilateral screening digital breast tomosynthesis was performed. The images were evaluated with computer-aided detection. COMPARISON:  Previous exam(s). ACR Breast Density Category c: The breast tissue is heterogeneously dense, which may obscure small masses. FINDINGS: There are no findings suspicious for malignancy. IMPRESSION: No mammographic evidence of malignancy. A result letter of this screening mammogram will be mailed directly to the patient. RECOMMENDATION: Screening  mammogram in one year. (Code:SM-B-01Y) BI-RADS CATEGORY  1: Negative. Electronically Signed   By: Audie Pinto M.D.   On: 10/14/2020 11:23    Assessment & Plan:   Problem List Items Addressed This Visit     Adjustment disorder with mixed anxiety and depressed mood    On Buspar, Clonazepam  Potential benefits of a long term benzodiazepines  use as well as potential risks  and complications were explained to the patient and were aknowledged.      COPD mixed type ([place])    Smoking discussed  Dyslipidemia - Primary   Relevant Orders   CT CARDIAC SCORING (SELF PAY ONLY)   Tobacco use disorder    1/2 ppd smoker. Discussed      Well adult exam    We discussed age appropriate health related issues, including available/recomended screening tests and vaccinations. We discussed a need for adhering to healthy diet and exercise. Labs were ordered to be later reviewed . All questions were answered. Trying to quit smoking Colon 2016,  2021 due in 02/2025 - Dr Silverio Decamp Pneumovax Ordered  calc score CT 10/21 did not have it         No orders of the defined types were placed in this encounter.     Follow-up: Return in about 1 year (around 12/28/2021) for a follow-up visit.  Walker Kehr, MD

## 2020-12-28 NOTE — Assessment & Plan Note (Signed)
1/2 ppd smoker. Discussed

## 2020-12-28 NOTE — Patient Instructions (Signed)

## 2021-01-05 ENCOUNTER — Inpatient Hospital Stay: Payer: No Typology Code available for payment source | Attending: Adult Health | Admitting: Adult Health

## 2021-01-05 ENCOUNTER — Other Ambulatory Visit: Payer: Self-pay

## 2021-01-05 ENCOUNTER — Encounter: Payer: Self-pay | Admitting: Adult Health

## 2021-01-05 VITALS — BP 174/82 | HR 70 | Temp 98.1°F | Resp 18 | Ht 65.0 in | Wt 207.8 lb

## 2021-01-05 DIAGNOSIS — Z7951 Long term (current) use of inhaled steroids: Secondary | ICD-10-CM | POA: Insufficient documentation

## 2021-01-05 DIAGNOSIS — Z923 Personal history of irradiation: Secondary | ICD-10-CM | POA: Diagnosis not present

## 2021-01-05 DIAGNOSIS — Z170 Estrogen receptor positive status [ER+]: Secondary | ICD-10-CM | POA: Insufficient documentation

## 2021-01-05 DIAGNOSIS — Z8052 Family history of malignant neoplasm of bladder: Secondary | ICD-10-CM | POA: Insufficient documentation

## 2021-01-05 DIAGNOSIS — Z803 Family history of malignant neoplasm of breast: Secondary | ICD-10-CM | POA: Insufficient documentation

## 2021-01-05 DIAGNOSIS — Z800 Family history of malignant neoplasm of digestive organs: Secondary | ICD-10-CM | POA: Diagnosis not present

## 2021-01-05 DIAGNOSIS — Z888 Allergy status to other drugs, medicaments and biological substances status: Secondary | ICD-10-CM | POA: Diagnosis not present

## 2021-01-05 DIAGNOSIS — Z818 Family history of other mental and behavioral disorders: Secondary | ICD-10-CM | POA: Diagnosis not present

## 2021-01-05 DIAGNOSIS — Z820 Family history of epilepsy and other diseases of the nervous system: Secondary | ICD-10-CM | POA: Diagnosis not present

## 2021-01-05 DIAGNOSIS — Z801 Family history of malignant neoplasm of trachea, bronchus and lung: Secondary | ICD-10-CM | POA: Insufficient documentation

## 2021-01-05 DIAGNOSIS — Z8042 Family history of malignant neoplasm of prostate: Secondary | ICD-10-CM | POA: Diagnosis not present

## 2021-01-05 DIAGNOSIS — Z8349 Family history of other endocrine, nutritional and metabolic diseases: Secondary | ICD-10-CM | POA: Insufficient documentation

## 2021-01-05 DIAGNOSIS — F17210 Nicotine dependence, cigarettes, uncomplicated: Secondary | ICD-10-CM | POA: Diagnosis not present

## 2021-01-05 DIAGNOSIS — C50412 Malignant neoplasm of upper-outer quadrant of left female breast: Secondary | ICD-10-CM | POA: Insufficient documentation

## 2021-01-05 DIAGNOSIS — Z8719 Personal history of other diseases of the digestive system: Secondary | ICD-10-CM | POA: Insufficient documentation

## 2021-01-05 DIAGNOSIS — Z8249 Family history of ischemic heart disease and other diseases of the circulatory system: Secondary | ICD-10-CM | POA: Insufficient documentation

## 2021-01-05 DIAGNOSIS — E785 Hyperlipidemia, unspecified: Secondary | ICD-10-CM | POA: Insufficient documentation

## 2021-01-05 DIAGNOSIS — J449 Chronic obstructive pulmonary disease, unspecified: Secondary | ICD-10-CM | POA: Diagnosis not present

## 2021-01-05 DIAGNOSIS — Z87891 Personal history of nicotine dependence: Secondary | ICD-10-CM | POA: Diagnosis not present

## 2021-01-05 DIAGNOSIS — Z808 Family history of malignant neoplasm of other organs or systems: Secondary | ICD-10-CM | POA: Insufficient documentation

## 2021-01-05 DIAGNOSIS — Z79811 Long term (current) use of aromatase inhibitors: Secondary | ICD-10-CM | POA: Insufficient documentation

## 2021-01-05 DIAGNOSIS — Z832 Family history of diseases of the blood and blood-forming organs and certain disorders involving the immune mechanism: Secondary | ICD-10-CM | POA: Diagnosis not present

## 2021-01-05 DIAGNOSIS — Z880 Allergy status to penicillin: Secondary | ICD-10-CM | POA: Diagnosis not present

## 2021-01-05 DIAGNOSIS — D869 Sarcoidosis, unspecified: Secondary | ICD-10-CM | POA: Diagnosis not present

## 2021-01-05 DIAGNOSIS — Z9049 Acquired absence of other specified parts of digestive tract: Secondary | ICD-10-CM | POA: Insufficient documentation

## 2021-01-05 NOTE — Progress Notes (Signed)
CLINIC:  Survivorship   REASON FOR VISIT:  Routine follow-up for history of breast cancer.   BRIEF ONCOLOGIC HISTORY:  Oncology History Overview Note  ATM and MUTYH VUS.  Otherwise negative genetic testing   Malignant neoplasm of upper-outer quadrant of left breast in female, estrogen receptor positive ([place])  06/20/2011 Surgery   Left breast lumpectomy with sentinel lymph node biopsy: 1 cm IDC, grade 1, ER positive, PR positive, HER-2 negative, Ki-67 6%   08/17/2011 - 09/28/2011 Radiation Therapy   Adjuvant radiation therapy   10/02/2011 - 11/23/2016 Anti-estrogen oral therapy   Arimidex 1 mg daily started 10/02/2011 stopped March 2015 for pain and restarted August 2015   12/21/2017 Genetic Testing   ATM c.3191T>C and MUTYH c.920G>A VUS identified on the common hereditary cancer panel.  The Hereditary Gene Panel offered by [facility] includes sequencing and/or deletion duplication testing of the following 47 genes: APC, ATM, AXIN2, BARD1, BMPR1A, BRCA1, BRCA2, BRIP1, CDH1, CDK4, CDKN2A (p14ARF), CDKN2A (p16INK4a), CHEK2, CTNNA1, DICER1, EPCAM (Deletion/duplication testing only), GREM1 (promoter region deletion/duplication testing only), KIT, MEN1, MLH1, MSH2, MSH3, MSH6, MUTYH, NBN, NF1, NHTL1, PALB2, PDGFRA, PMS2, POLD1, POLE, PTEN, RAD50, RAD51C, RAD51D, SDHB, SDHC, SDHD, SMAD4, SMARCA4. STK11, TP53, TSC1, TSC2, and VHL.  The following genes were evaluated for sequence changes only: SDHA and HOXB13 c.251G>A variant only. The report date is 12/21/2017.      INTERVAL HISTORY:  Ms. Runions [facility] today for routine follow-up for her history of breast cancer.  She notes that she is doing well today.  Her most recent mammogram was completed on October 14, 2020.  There is no evidence of malignancy and she had a breast density category of C.  Marialena notes that she has been doing well lately.  She says that during the day she spends her time taking care of of her parents  and her father-in-law who are older.  She says that this is stressful.  She notes her blood pressure is up today.  She also notes that sometimes she will have an occasional fast food meal which she did yesterday.  She is not exercising regularly but notes that she plans to get back into this.   REVIEW OF SYSTEMS:  Review of Systems  Constitutional:  Negative for appetite change, chills, fatigue, fever and unexpected weight change.  HENT:   Negative for hearing loss, lump/mass, sore throat, tinnitus and trouble swallowing.   Eyes:  Negative for eye problems and icterus.  Respiratory:  Negative for chest tightness, cough, shortness of breath and wheezing.   Cardiovascular:  Negative for chest pain, leg swelling and palpitations.  Gastrointestinal:  Negative for abdominal distention, abdominal pain, constipation, diarrhea, nausea and vomiting.  Endocrine: Negative for hot flashes.  Genitourinary:  Negative for difficulty urinating.   Musculoskeletal:  Negative for arthralgias.  Skin:  Negative for itching and rash.  Neurological:  Negative for dizziness, extremity weakness, headaches and numbness.  Hematological:  Negative for adenopathy. Does not bruise/bleed easily.  Psychiatric/Behavioral:  Negative for depression. The patient is not nervous/anxious.  Breast: Denies any new nodularity, masses, tenderness, nipple changes, or nipple discharge.       PAST MEDICAL/SURGICAL HISTORY:  Past Medical History:  Diagnosis Date   Allergy    Anemia    hx   Anxiety    panic disorder   Breast cancer ([place]) 06/06/2011   bc  left breast 3 o'clock dx=invasive ductal ca uoqER/PR=positive   Cancer (HCC)    BREAST -  left   Cigarette smoker    Colon polyp    Complication of anesthesia    DIFFICULTY AWAKENING, BP INCREASE AFTER CHOLECYSTECTEMY    COPD (chronic obstructive pulmonary disease) (HCC)    Depression    Diverticulosis of colon    DJD (degenerative joint disease)    DJD (degenerative joint  disease)    Family history of breast cancer    Family history of colon cancer    Family history of pancreatic cancer    Family history of prostate cancer    Gallstones    GERD (gastroesophageal reflux disease)    Pt. denies having GERD. Unable to remove it.   Headache(784.0)    History of anemia    History of bronchitis    History of sarcoidosis    Hyperlipidemia    no meds needed   Incisional breast wound    AT AGE 44 BILATERAL INCISION TO BREAST MADE   Leg swelling    Panic disorder    Personal history of radiation therapy    S/P radiation therapy 08/17/11 - 09/28/11   LLQ - 50 Gy/25 Fractions with Boost of 10 Gy / 5 fractions   Sialoadenitis of submandibular gland 2016   Vitamin D deficiency    Past Surgical History:  Procedure Laterality Date   BIOPSY BREAST     left breast  as a teenager  benign   BREAST BIOPSY Left 06/06/2011   BREAST EXCISIONAL BIOPSY Bilateral    BREAST LUMPECTOMY Left 2013   BREAST SURGERY  07/05/11   left breast lumpectomy with needle loc & axillary sln bx   BUNIONECTOMY  2011   bilateral by Dr Little Ishikawa   CHOLECYSTECTOMY  02/2019   COLONOSCOPY     polyp   cyst removed from right hand  [place]  2000   Dr Gae Gallop FINGER RELEASE  2008   Dr Burney Gauze     ALLERGIES:  Allergies  Allergen Reactions   Lexapro [Escitalopram Oxalate]     Felt like zombie   Penicillins Rash and Other (See Comments)    At injection site.     CURRENT MEDICATIONS:  Outpatient Encounter Medications as of 01/05/2021  Medication Sig   albuterol (VENTOLIN HFA) 108 (90 Base) MCG/ACT inhaler Inhale 2 puffs into the lungs every 6 (six) hours as needed for wheezing or shortness of breath.   budesonide-formoterol (SYMBICORT) 80-4.5 MCG/ACT inhaler TAKE 2 PUFFS BY MOUTH TWICE A DAY   busPIRone (BUSPAR) 30 MG tablet TAKE 1/2 TO 1 (ONE-HALF TO ONE) TABLET BY MOUTH TWICE DAILY   celecoxib (CELEBREX) 200 MG  capsule Take 1 capsule (200 mg total) by mouth daily as needed for moderate pain.   Cholecalciferol 5000 UNITS TABS Take 1 tablet by mouth 3 (three) times a week.   clobetasol cream (TEMOVATE) 6.20 % Apply 1 application topically 2 (two) times daily. (Patient taking differently: Apply 1 application topically 2 (two) times daily as needed.)   clonazePAM (KLONOPIN) 0.5 MG tablet TAKE 1/2 TO 1 TABLET BY MOUTH TWICE A DAY AS NEEDED   diclofenac Sodium (VOLTAREN) 1 % GEL Apply 4 g topically 4 (four) times daily.   ipratropium (ATROVENT) 0.03 % nasal spray Place 2 sprays into both nostrils 2 (two) times daily as needed for rhinitis.   Multiple Vitamin (MULTIVITAMIN WITH MINERALS) TABS tablet Take 1 tablet by mouth daily.   [DISCONTINUED] pantoprazole (PROTONIX) 40 MG  tablet Take 1 tablet (40 mg total) by mouth daily.   No facility-administered encounter medications on file as of 01/05/2021.     ONCOLOGIC FAMILY HISTORY:  Family History  Problem Relation Age of Onset   Anemia Mother    Other Mother        + H Pylori   Seizures Mother    Prostate cancer Father        dx over 15   Colon cancer Father 62       diagnosed 2007/04/18   Bladder Cancer Father        dx over 30   Colon polyps Father    Breast cancer Sister        dx in her 33s; mat half sister   Pancreatic cancer Sister 89       d. 58   Lung cancer Sister    Sarcoidosis Sister        mat 1/2 sister   Multiple sclerosis Brother        #2   Hypertension Brother        #1   Hyperlipidemia Brother    Prostate cancer Brother        dx under 45   Breast cancer Cousin        pat first cousin d. 24   Dementia Maternal Aunt    Seizures Maternal Uncle    Lung cancer Paternal Aunt    Brain cancer Other        benign   Dementia Maternal Aunt    Lung cancer Maternal Uncle    Sarcoidosis Cousin        mat first cousin   Breast cancer Cousin        maternal 2nd cousins - distant   Esophageal cancer Neg Hx    Stomach cancer Neg Hx     Rectal cancer Neg Hx     GENETIC COUNSELING/TESTING: BRCA and BART tested in Apr 18, 2011 and negative  SOCIAL HISTORY:  Social History   Socioeconomic History   Marital status: Married    Spouse name: Not on file   Number of children: 2   Years of education: Not on file   Highest education level: Not on file  Occupational History   Occupation: retired  Tobacco Use   Smoking status: Every Day    Packs/day: 0.50    Types: Cigarettes    Last attempt to quit: 04/16/2012    Years since quitting: 8.7   Smokeless tobacco: Never   Tobacco comments:    STOPPED AND RESTARTED  Vaping Use   Vaping Use: Never used  Substance and Sexual Activity   Alcohol use: Yes    Alcohol/week: 0.0 - 1.0 standard drinks    Comment: social   Drug use: No   Sexual activity: Not Currently    Birth control/protection: Surgical    Comment: menses age 1,hrt started  1998/04/17  Other Topics Concern   Not on file  Social History Narrative   Widowed - husband died 04-18-2003 step-children   Exercises 3x per week   2 cups of caffeine daily         Social Determinants of Health   Financial Resource Strain: Not on file  Food Insecurity: Not on file  Transportation Needs: Not on file  Physical Activity: Not on file  Stress: Not on file  Social Connections: Not on file  Intimate Partner Violence: Not on file      PHYSICAL EXAMINATION:  Vital  Signs: Vitals:   01/05/21 1026  BP: (!) 174/82  Pulse: 70  Resp: 18  Temp: 98.1 F (36.7 C)  SpO2: 100%   Filed Weights   01/05/21 1026  Weight: 207 lb 12.8 oz (94.3 kg)   General: Well-nourished, well-appearing female in no acute distress.  Unaccompanied today.   HEENT: Head is normocephalic.  Pupils equal and reactive to light. Conjunctivae clear without exudate.  Sclerae anicteric. Oral mucosa is pink, moist.  Oropharynx is pink without lesions or erythema.  Lymph: No cervical, supraclavicular, or infraclavicular lymphadenopathy noted on palpation.   Cardiovascular: Regular rate and rhythm.Marland Kitchen Respiratory: Clear to auscultation bilaterally. Chest expansion symmetric; breathing non-labored.  Breast Exam:  -Left breast:. No skin redness, thickening, or peau d'orange appearance; no nipple retraction or nipple discharge; small about 0.5cm nodule noted at 3 oclock on left breast about 8-9cmfn (same as years prior) -Right breast: No appreciable masses on palpation. No skin redness, thickening, or peau d'orange appearance; no nipple retraction or nipple discharge; -Axilla: No axillary adenopathy bilaterally.  GI: Abdomen soft and round; non-tender, non-distended. Bowel sounds normoactive. No hepatosplenomegaly.   GU: Deferred.  Neuro: No focal deficits. Steady gait.  Psych: Mood and affect normal and appropriate for situation.  MSK: No focal spinal tenderness to palpation, full range of motion in bilateral upper extremities Extremities: No edema. Skin: Warm and dry.  LABORATORY DATA:  None for this visit   DIAGNOSTIC IMAGING:  Most recent mammogram:   ASSESSMENT AND PLAN:  Ms.. Seim is a pleasant 65 y.o. female with history of Stage IA left breast invasive ductal carcinoma, ER+/PR+/HER2-, diagnosed in 2013, treated with lumpectomy, adjuvant radiation therapy, and anti-estrogen therapy with Anastrozole x 5 years completing in 10/2016.  She presents to the [facility] for surveillance and routine follow-up.   1. History of breast cancer:  Ms. Rouse is currently clinically and radiographically without evidence of disease or recurrence of breast cancer. She will be due for mammogram in 09/2021.  She and I discussed her left breast cyst.  We are monitoring this.  She knows if it gets bigger to let me know and we will get a mammogram and ultrasound.  She will return in one year for LTS follow up.  I encouraged her to call me with any questions or concerns before her next visit at the cancer center, and I would be happy to see her sooner, if  needed.    2.  Bone health:  She was given education on specific food and activities to promote bone health.  3. Cancer screening:  Due to Ms. Wire's history and her age, she should receive screening for skin cancers, colon cancer, and gynecologic cancers. She was encouraged to follow-up with her PCP for appropriate cancer screenings.   4. Health maintenance and wellness promotion: Ms. Mcfadden was encouraged to consume 5-7 servings of fruits and vegetables per day. She was also encouraged to engage in moderate to vigorous exercise for 30 minutes per day most days of the week. She was instructed to limit her alcohol consumption and was encouraged stop smoking.  We reviewed ideas for smoking cessation, and ways to quit smoking.  We discussed her increased stress level related to caregiving for multiple parents and her family.  I encouraged her to carve out 15 to 30 minutes a day to go on a walk.  We also discussed limiting sodium in her food.  She is interested in participating in our caregiver support group which would also provide  an additional layer of support.    Dispo:  -Return to cancer center in one year for LTS follow up -Mammogram in August 2023.   Total encounter time: 20 minutes*in face-to-face visit time, chart review, lab review, care coordination, and documentation of the encounter.  Wilber Bihari, NP 01/05/21 10:39 AM Medical Oncology and Hematology [facility] [place], [place] [address] Tel. [phone]    Fax. [phone]  *Total Encounter Time as defined by the Centers for Medicare and Medicaid Services includes, in addition to the face-to-face time of a patient visit (documented in the note above) non-face-to-face time: obtaining and reviewing outside history, ordering and reviewing medications, tests or procedures, care coordination (communications with other health care professionals or caregivers) and documentation in the medical  record.    Note: PRIMARY CARE PROVIDER Cassandria Anger, [place] [phone]

## 2021-01-07 ENCOUNTER — Encounter: Payer: No Typology Code available for payment source | Admitting: Adult Health

## 2021-01-09 ENCOUNTER — Emergency Department (HOSPITAL_BASED_OUTPATIENT_CLINIC_OR_DEPARTMENT_OTHER)
Admission: EM | Admit: 2021-01-09 | Discharge: 2021-01-09 | Disposition: A | Discharge status: Home / Self Care | Payer: No Typology Code available for payment source | Attending: Emergency Medicine | Admitting: Emergency Medicine

## 2021-01-09 ENCOUNTER — Other Ambulatory Visit: Payer: Self-pay

## 2021-01-09 ENCOUNTER — Encounter (HOSPITAL_BASED_OUTPATIENT_CLINIC_OR_DEPARTMENT_OTHER): Payer: Self-pay | Admitting: Emergency Medicine

## 2021-01-09 ENCOUNTER — Emergency Department (HOSPITAL_BASED_OUTPATIENT_CLINIC_OR_DEPARTMENT_OTHER): Payer: No Typology Code available for payment source

## 2021-01-09 DIAGNOSIS — F17210 Nicotine dependence, cigarettes, uncomplicated: Secondary | ICD-10-CM | POA: Insufficient documentation

## 2021-01-09 DIAGNOSIS — J449 Chronic obstructive pulmonary disease, unspecified: Secondary | ICD-10-CM | POA: Insufficient documentation

## 2021-01-09 DIAGNOSIS — R059 Cough, unspecified: Secondary | ICD-10-CM | POA: Diagnosis not present

## 2021-01-09 DIAGNOSIS — R0602 Shortness of breath: Secondary | ICD-10-CM | POA: Insufficient documentation

## 2021-01-09 DIAGNOSIS — J029 Acute pharyngitis, unspecified: Secondary | ICD-10-CM | POA: Diagnosis not present

## 2021-01-09 DIAGNOSIS — J3489 Other specified disorders of nose and nasal sinuses: Secondary | ICD-10-CM | POA: Insufficient documentation

## 2021-01-09 DIAGNOSIS — Z20822 Contact with and (suspected) exposure to covid-19: Secondary | ICD-10-CM | POA: Diagnosis not present

## 2021-01-09 DIAGNOSIS — Z7951 Long term (current) use of inhaled steroids: Secondary | ICD-10-CM | POA: Diagnosis not present

## 2021-01-09 DIAGNOSIS — J441 Chronic obstructive pulmonary disease with (acute) exacerbation: Secondary | ICD-10-CM

## 2021-01-09 DIAGNOSIS — Z853 Personal history of malignant neoplasm of breast: Secondary | ICD-10-CM | POA: Diagnosis not present

## 2021-01-09 LAB — BASIC METABOLIC PANEL
Anion gap: 10 (ref 5–15)
BUN: 14 mg/dL (ref 8–23)
CO2: 23 mmol/L (ref 22–32)
Calcium: 9.6 mg/dL (ref 8.9–10.3)
Chloride: 105 mmol/L (ref 98–111)
Creatinine, Ser: 0.8 mg/dL (ref 0.44–1.00)
GFR, Estimated: >60 mL/min (ref >60)
Glucose, Bld: 119 mg/dL — ABNORMAL HIGH (ref 70–99)
Potassium: 4.1 mmol/L (ref 3.5–5.1)
Sodium: 138 mmol/L (ref 135–145)

## 2021-01-09 LAB — CBC
HCT: 45.6 % (ref 36.0–46.0)
Hemoglobin: 14 g/dL (ref 12.0–15.0)
MCH: 23.8 pg — ABNORMAL LOW (ref 26.0–34.0)
MCHC: 30.7 g/dL (ref 30.0–36.0)
MCV: 77.4 fL — ABNORMAL LOW (ref 80.0–100.0)
Platelets: 142 10*3/uL — ABNORMAL LOW (ref 150–400)
RBC: 5.89 MIL/uL — ABNORMAL HIGH (ref 3.87–5.11)
RDW: 14.6 % (ref 11.5–15.5)
WBC: 5.9 10*3/uL (ref 4.0–10.5)
nRBC: 0 % (ref 0.0–0.2)

## 2021-01-09 LAB — BRAIN NATRIURETIC PEPTIDE: B Natriuretic Peptide: 19.7 pg/mL (ref 0.0–100.0)

## 2021-01-09 LAB — RESP PANEL BY RT-PCR (FLU A&B, COVID) ARPGX2
Influenza A by PCR: NEGATIVE
Influenza B by PCR: NEGATIVE
SARS Coronavirus 2 by RT PCR: NEGATIVE

## 2021-01-09 LAB — TROPONIN I (HIGH SENSITIVITY): Troponin I (High Sensitivity): 6 ng/L (ref <18)

## 2021-01-09 MED ORDER — PREDNISONE 20 MG PO TABS
40.0000 mg | ORAL_TABLET | Freq: Every day | ORAL | 0 refills | Status: AC
Start: 2021-01-09 — End: 2021-01-13

## 2021-01-09 MED ORDER — METHYLPREDNISOLONE SODIUM SUCC 125 MG IJ SOLR
125.0000 mg | Freq: Once | INTRAMUSCULAR | Status: AC
  Administered 2021-01-09: 125 mg via INTRAVENOUS
  Filled 2021-01-09: qty 2

## 2021-01-09 MED ORDER — IPRATROPIUM-ALBUTEROL 0.5-2.5 (3) MG/3ML IN SOLN
3.0000 mL | Freq: Once | RESPIRATORY_TRACT | Status: AC
  Administered 2021-01-09: 3 mL via RESPIRATORY_TRACT
  Filled 2021-01-09: qty 3

## 2021-01-09 MED ORDER — AMOXICILLIN-POT CLAVULANATE 875-125 MG PO TABS: 1.0000 | ORAL_TABLET | Freq: Two times a day (BID) | ORAL | 0 refills | Status: DC

## 2021-01-09 NOTE — Progress Notes (Signed)
Patient ambulated with pulse OX. SAT consistently 92%. Able to speak in complete sentences during walk. MD aware

## 2021-01-09 NOTE — ED Provider Notes (Signed)
[facility] EMERGENCY DEPARTMENT Provider Note   CSN: [id number] Arrival date & time: 01/09/21  5400     History Chief Complaint  Patient presents with   Cough    Marilyn [name] is a 65 y.o. female.  65 year old female with a past medical history of COPD presents today for evaluation of cough, sore throat, and shortness of breath of 4-day duration.  Patient reports her granddaughter had RSV recently and she has been around her earlier in the week.  Patient developed her symptoms on Thursday and has progressively worsened since then with developing shortness of breath couple days ago primarily with exertion.  She denies fever, chills, headache, chest pain, or vomiting.  She has not tried anything over-the-counter for her symptoms.  The history is provided by the patient. No language interpreter was used.      Past Medical History:  Diagnosis Date   Allergy    Anemia    hx   Anxiety    panic disorder   Breast cancer ([place]) 06/06/2011   bc  left breast 3 o'clock dx=invasive ductal ca uoqER/PR=positive   Cancer (HCC)    BREAST - left   Cigarette smoker    Colon polyp    Complication of anesthesia    DIFFICULTY AWAKENING, BP INCREASE AFTER CHOLECYSTECTEMY    COPD (chronic obstructive pulmonary disease) (HCC)    Depression    Diverticulosis of colon    DJD (degenerative joint disease)    DJD (degenerative joint disease)    Family history of breast cancer    Family history of colon cancer    Family history of pancreatic cancer    Family history of prostate cancer    Gallstones    GERD (gastroesophageal reflux disease)    Pt. denies having GERD. Unable to remove it.   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   Knee pain, left 08/28/2019   Elevated BP without diagnosis of hypertension 08/28/2019   Callus of foot 08/28/2019   Cholelithiasis 12/26/2018   Genetic testing 12/26/2017   Family history of breast cancer    Family history of pancreatic cancer    Family history of colon cancer    Abdominal pain 07/28/2016   Tobacco use disorder 05/08/2016   Obesity 03/13/2016   Dyspnea 11/09/2015   Well adult exam 07/23/2014   Sialoadenitis 06/02/2014   Edema 07/07/2013   AB (asthmatic bronchitis) 06/23/2013   Anxiety    Malignant neoplasm of upper-outer quadrant of left breast in female, estrogen receptor positive ([place]) 06/09/2011   COLONIC POLYPS 01/25/2010   DIVERTICULOSIS OF COLON 01/25/2010   DEGENERATIVE JOINT DISEASE 11/06/2008   PANIC DISORDER 07/23/2008   Sarcoidosis 10/13/2007   Vitamin D deficiency 10/13/2007   ANEMIA 10/13/2007   BRONCHITIS 10/13/2007   Dyslipidemia 04/23/2007   Adjustment disorder with mixed anxiety and depressed  mood 04/23/2007   Venous (peripheral) insufficiency 04/23/2007   HEADACHE 04/23/2007    Past Surgical History:  Procedure Laterality Date   BIOPSY BREAST     left breast  as a teenager  benign   BREAST BIOPSY Left 06/06/2011   BREAST EXCISIONAL BIOPSY Bilateral    BREAST LUMPECTOMY Left 2013   BREAST SURGERY  07/05/11   left breast lumpectomy with needle loc & axillary sln bx   BUNIONECTOMY  2011   bilateral by Dr Little Ishikawa   CHOLECYSTECTOMY  02/2019   COLONOSCOPY     polyp   cyst removed from right hand  [place]  2000   Dr Gae Gallop FINGER RELEASE  2008   Dr Burney Gauze     OB History   No obstetric history on  file.    Obstetric Comments  Menarche age 41, nulliparity, HRT x 1 year, Hysterectomy         Family History  Problem Relation Age of Onset   Anemia Mother    Other Mother        + H Pylori   Seizures Mother    Prostate cancer Father        dx over 23   Colon cancer Father 42       diagnosed 2009   Bladder Cancer Father        dx over 18   Colon polyps Father    Breast cancer Sister        dx in her 65s; mat half sister   Pancreatic cancer Sister 47       d. 73   Lung cancer Sister    Sarcoidosis Sister        mat 1/2 sister   Multiple sclerosis Brother        #2   Hypertension Brother        #1   Hyperlipidemia Brother    Prostate cancer Brother        dx under 78   Breast cancer Cousin        pat first cousin d. 46   Dementia Maternal Aunt    Seizures Maternal Uncle    Lung cancer Paternal Aunt    Brain cancer Other        benign   Dementia Maternal Aunt    Lung cancer Maternal Uncle    Sarcoidosis Cousin        mat first cousin   Breast cancer Cousin        maternal 2nd cousins - distant   Esophageal cancer Neg Hx    Stomach cancer Neg Hx    Rectal cancer Neg Hx     Social History   Tobacco Use   Smoking status: Every Day    Packs/day: 0.50    Types: Cigarettes    Last attempt to quit: 04/16/2012    Years since quitting: 8.7   Smokeless tobacco: Never   Tobacco comments:    STOPPED AND RESTARTED  Vaping Use   Vaping Use: Never used  Substance Use Topics   Alcohol use: Yes    Alcohol/week: 0.0 - 1.0 standard drinks    Comment: social   Drug use: No    Home Medications Prior to Admission medications   Medication Sig Start Date End Date Taking? Authorizing Provider  albuterol (VENTOLIN HFA) 108 (90 Base) MCG/ACT inhaler Inhale 2 puffs into the lungs every 6 (  six) hours as needed for wheezing or shortness of breath. 12/25/19   Plotnikov, Evie Lacks, MD  budesonide-formoterol (SYMBICORT) 80-4.5 MCG/ACT inhaler TAKE 2 PUFFS BY MOUTH TWICE A DAY  12/27/20   Plotnikov, Evie Lacks, MD  busPIRone (BUSPAR) 30 MG tablet TAKE 1/2 TO 1 (ONE-HALF TO ONE) TABLET BY MOUTH TWICE DAILY 06/07/20   Plotnikov, Evie Lacks, MD  celecoxib (CELEBREX) 200 MG capsule Take 1 capsule (200 mg total) by mouth daily as needed for moderate pain. 08/28/19   Plotnikov, Evie Lacks, MD  Cholecalciferol 5000 UNITS TABS Take 1 tablet by mouth 3 (three) times a week.    [provider]  clobetasol cream (TEMOVATE) 7.00 % Apply 1 application topically 2 (two) times daily. Patient taking differently: Apply 1 application topically 2 (two) times daily as needed. 09/13/17   Plotnikov, Evie Lacks, MD  clonazePAM (KLONOPIN) 0.5 MG tablet TAKE 1/2 TO 1 TABLET BY MOUTH TWICE A DAY AS NEEDED 09/18/18   Plotnikov, Evie Lacks, MD  diclofenac Sodium (VOLTAREN) 1 % GEL Apply 4 g topically 4 (four) times daily. 08/11/19   Lamptey, Myrene Galas, MD  ipratropium (ATROVENT) 0.03 % nasal spray Place 2 sprays into both nostrils 2 (two) times daily as needed for rhinitis. 07/27/20   Scot Jun, FNP  Multiple Vitamin (MULTIVITAMIN WITH MINERALS) TABS tablet Take 1 tablet by mouth daily.    [provider]  pantoprazole (PROTONIX) 40 MG tablet Take 1 tablet (40 mg total) by mouth daily. 04/28/19 08/11/19  Plotnikov, Evie Lacks, MD    Allergies    Lexapro [escitalopram oxalate] and Penicillins  Review of Systems   Review of Systems  Constitutional:  Negative for activity change, chills and fever.  HENT:  Positive for congestion. Negative for sinus pressure.   Eyes:  Negative for visual disturbance.  Respiratory:  Positive for cough and shortness of breath.   Cardiovascular:  Negative for chest pain, palpitations and leg swelling.  Gastrointestinal:  Negative for abdominal pain, nausea and vomiting.  Genitourinary:  Negative for difficulty urinating.  Neurological:  Negative for weakness and headaches.  All other systems reviewed and are negative.  Physical Exam Updated Vital  Signs BP (!) 149/70   Pulse 86   Temp 98.6 F (37 C) (Oral)   Resp 20   Ht 5\' 5"  (1.651 m)   Wt 90.7 kg   SpO2 94%   BMI 33.28 kg/m   Physical Exam Vitals and nursing note reviewed.  Constitutional:      General: She is not in acute distress.    Appearance: Normal appearance. She is not ill-appearing.  HENT:     Head: Normocephalic and atraumatic.     Nose: Nose normal.  Eyes:     General: No scleral icterus.    Extraocular Movements: Extraocular movements intact.     Conjunctiva/sclera: Conjunctivae normal.  Cardiovascular:     Rate and Rhythm: Normal rate and regular rhythm.     Pulses: Normal pulses.     Heart sounds: Normal heart sounds.  Pulmonary:     Effort: Pulmonary effort is normal. No respiratory distress.     Breath sounds: Normal breath sounds. No wheezing.     Comments: Coarse lung sounds throughout. Abdominal:     General: There is no distension.     Tenderness: There is no abdominal tenderness.  Musculoskeletal:        General: Normal range of motion.     Cervical back: Normal range of motion.  Right lower leg: No edema.     Left lower leg: No edema.  Skin:    General: Skin is warm and dry.  Neurological:     General: No focal deficit present.     Mental Status: She is alert. Mental status is at baseline.    ED Results / Procedures / Treatments   Labs (all labs ordered are listed, but only abnormal results are displayed) Labs Reviewed  CBC - Abnormal; Notable for the following components:      Result Value   RBC 5.89 (*)    MCV 77.4 (*)    MCH 23.8 (*)    Platelets 142 (*)    All other components within normal limits  RESP PANEL BY RT-PCR (FLU A&B, COVID) ARPGX2  BRAIN NATRIURETIC PEPTIDE  BASIC METABOLIC PANEL  TROPONIN I (HIGH SENSITIVITY)    EKG None  Radiology DG Chest 2 View  Result Date: 01/09/2021 CLINICAL DATA:  Productive cough and dyspnea EXAM: CHEST - 2 VIEW COMPARISON:  August 26, 2020 FINDINGS: The heart size and  mediastinal contours are within normal limits. Both lungs are clear. The visualized skeletal structures are unremarkable. Surgical clips are projected over the left breast/chest. IMPRESSION: No active cardiopulmonary disease. Electronically Signed   By: Abelardo Diesel M.D.   On: 01/09/2021 13:15    Procedures Procedures   Medications Ordered in ED Medications  ipratropium-albuterol (DUONEB) 0.5-2.5 (3) MG/3ML nebulizer solution 3 mL (3 mLs Nebulization Given 01/09/21 1311)  methylPREDNISolone sodium succinate (SOLU-MEDROL) 125 mg/2 mL injection 125 mg (125 mg Intravenous Given 01/09/21 1335)    ED Course  I have reviewed the triage vital signs and the nursing notes.  Pertinent labs & imaging results that were available during my care of the patient were reviewed by me and considered in my medical decision making (see chart for details).    MDM Rules/Calculators/A&P                           65 year old female presents today for evaluation of 4-day duration of sinus congestion, cough and exertional dyspnea.  Patient has a history of COPD.  Patient has recently been exposed to her grandchild who had RSV.  Patient is without wheezing but does have coarse lung sounds.  Patient ambulated within the room on pulse ox with desaturation down to 88% with recovery in her saturations upon rest.  Patient given 125 mg of Solu-Medrol, DuoNeb.  CBC, BMP, BNP, EKG, troponin ordered.   On reevaluation patient is resting without acute distress.  Patient ambulated with respiratory therapist without significant desaturation.  Patient's respiratory panel was negative for COVID, and flu.  BMP unremarkable with the exception of glucose of 119.  BNP of within normal limits, troponin 6, CBC without leukocytosis.  Chest x-ray without acute pneumonia.  Given patient's history of COPD will treat patient for COPD exacerbation with Augmentin and prednisone.  Patient voices understanding and is in agreement with plan.   Extensive discussion had with patient regarding return precautions.  Discussed importance of follow-up with her PCP.  I discussed this case with my attending physician who cosigned this note including patient's presenting symptoms, physical exam, and planned diagnostics and interventions. Attending physician stated agreement with plan or made changes to plan which were implemented.   Final Clinical Impression(s) / ED Diagnoses Final diagnoses:  None    Rx / DC Orders ED Discharge Orders     None  Evlyn Courier, PA-C 01/09/21 4862    Charlesetta Shanks, MD 01/13/21 [phone]

## 2021-01-09 NOTE — ED Notes (Signed)
ED Provider at bedside. 

## 2021-01-09 NOTE — ED Notes (Signed)
Patient transported to X-ray 

## 2021-01-09 NOTE — ED Triage Notes (Signed)
Pt arrives pov, ambulatory to triage, endorses cough x 5 days, shob that started last night. Pt reports exposure to RSV.

## 2021-01-09 NOTE — Discharge Instructions (Addendum)
Your blood work and chest x-ray are reassuring against acute infection.  He did ambulate around the emergency department with maintaining normal sats.  He received a breathing treatment as well as IV steroids with improvement in your symptoms.  Your respiratory panel was negative for COVID, flu, and RSV.  I have sent in Augmentin as well as prednisone to the pharmacy for you to treat you for COPD exacerbation.  Since you received IV steroids in the emergency room, please start prednisone tomorrow morning.  If your breathing worsens or on your home pulse oximeter you noticed O2 sats of less than 88% please return to the emergency room.  Otherwise follow-up with your PCP.

## 2021-01-13 ENCOUNTER — Encounter: Payer: Self-pay | Admitting: Internal Medicine

## 2021-01-13 NOTE — Progress Notes (Signed)
Subjective:    Patient ID: Marilyn [name], female    DOB: 1955-05-30, 65 y.o.   MRN: [id number]  This visit occurred during the SARS-CoV-2 public health emergency.  Safety protocols were in place, including screening questions prior to the visit, additional usage of staff PPE, and extensive cleaning of exam room while observing appropriate contact time as indicated for disinfecting solutions.     HPI The patient is here for follow up from the ED  ED 11/13 for cough.   She has a h/o COPD and went to the ED for cough, ST, SOB  4 days.  Her granddaughter had RSV.  Her symptoms were worsening.  No F/C, HA, CP.  VSS, but O 2 dropped to 88% with improvement with rest.  Course BS on exam.  FLu/covid neg.  BMP, BNP, troponin, CBC unremarkable.  CXR w/o acute PNA.    She received a Duoneb, solu-medrol 125 mg IM for COPD exacerbation. She was d/c'd with Augmentin and prednisone.    She completed the prednisone and is still on the Augmentin which is causing diarrhea.  She has had the mucus in her chest break up a little in the past few days and is bringing up her phlegm.  She has a pulse ox at home and he oxygen has been above 90%.    She still has some SOB - it is a little better, but it is still affecting her ADL's.  She is using her symbicort bid.  She can hear the mucus in her chest - rattling in her chest.  The cough is better - she is bringing up a little sputum.      Medications and allergies reviewed with patient and updated if appropriate.  Patient Active Problem List   Diagnosis Date Noted   COPD mixed type ([place]) 09/30/2019   Knee pain, left 08/28/2019   Elevated BP without diagnosis of hypertension 08/28/2019   Callus of foot 08/28/2019   Cholelithiasis 12/26/2018   Genetic testing 12/26/2017   Family history of breast cancer    Family history of pancreatic cancer    Family history of colon cancer    Abdominal pain 07/28/2016   Tobacco use disorder 05/08/2016    Obesity 03/13/2016   Dyspnea 11/09/2015   Well adult exam 07/23/2014   Sialoadenitis 06/02/2014   Edema 07/07/2013   AB (asthmatic bronchitis) 06/23/2013   Anxiety    Malignant neoplasm of upper-outer quadrant of left breast in female, estrogen receptor positive ([place]) 06/09/2011   COLONIC POLYPS 01/25/2010   DIVERTICULOSIS OF COLON 01/25/2010   DEGENERATIVE JOINT DISEASE 11/06/2008   PANIC DISORDER 07/23/2008   Sarcoidosis 10/13/2007   Vitamin D deficiency 10/13/2007   ANEMIA 10/13/2007   BRONCHITIS 10/13/2007   Dyslipidemia 04/23/2007   Adjustment disorder with mixed anxiety and depressed mood 04/23/2007   Venous (peripheral) insufficiency 04/23/2007   HEADACHE 04/23/2007    Current Outpatient Medications on File Prior to Visit  Medication Sig Dispense Refill   albuterol (VENTOLIN HFA) 108 (90 Base) MCG/ACT inhaler Inhale 2 puffs into the lungs every 6 (six) hours as needed for wheezing or shortness of breath. 18 g 11   amoxicillin-clavulanate (AUGMENTIN) 875-125 MG tablet Take 1 tablet by mouth every 12 (twelve) hours. 14 tablet 0   budesonide-formoterol (SYMBICORT) 80-4.5 MCG/ACT inhaler TAKE 2 PUFFS BY MOUTH TWICE A DAY 10.2 each 11   busPIRone (BUSPAR) 30 MG tablet TAKE 1/2 TO 1 (ONE-HALF TO ONE) TABLET BY MOUTH TWICE  DAILY 60 tablet 5   celecoxib (CELEBREX) 200 MG capsule Take 1 capsule (200 mg total) by mouth daily as needed for moderate pain. 30 capsule 3   Cholecalciferol 5000 UNITS TABS Take 1 tablet by mouth 3 (three) times a week.     clobetasol cream (TEMOVATE) 3.66 % Apply 1 application topically 2 (two) times daily. (Patient taking differently: Apply 1 application topically 2 (two) times daily as needed.) 60 g 3   clonazePAM (KLONOPIN) 0.5 MG tablet TAKE 1/2 TO 1 TABLET BY MOUTH TWICE A DAY AS NEEDED 60 tablet 2   diclofenac Sodium (VOLTAREN) 1 % GEL Apply 4 g topically 4 (four) times daily.     ipratropium (ATROVENT) 0.03 % nasal spray Place 2 sprays into both  nostrils 2 (two) times daily as needed for rhinitis. 30 mL 0   Multiple Vitamin (MULTIVITAMIN WITH MINERALS) TABS tablet Take 1 tablet by mouth daily.     [DISCONTINUED] pantoprazole (PROTONIX) 40 MG tablet Take 1 tablet (40 mg total) by mouth daily. 90 tablet 3   No current facility-administered medications on file prior to visit.    Past Medical History:  Diagnosis Date   Allergy    Anemia    hx   Anxiety    panic disorder   Breast cancer ([place]) 06/06/2011   bc  left breast 3 o'clock dx=invasive ductal ca uoqER/PR=positive   Cancer (HCC)    BREAST - left   Cigarette smoker    Colon polyp    Complication of anesthesia    DIFFICULTY AWAKENING, BP INCREASE AFTER CHOLECYSTECTEMY    COPD (chronic obstructive pulmonary disease) (HCC)    Depression    Diverticulosis of colon    DJD (degenerative joint disease)    DJD (degenerative joint disease)    Family history of breast cancer    Family history of colon cancer    Family history of pancreatic cancer    Family history of prostate cancer    Gallstones    GERD (gastroesophageal reflux disease)    Pt. denies having GERD. Unable to remove it.   Headache(784.0)    History of anemia    History of bronchitis    History of sarcoidosis    Hyperlipidemia    no meds needed   Incisional breast wound    AT AGE 24 BILATERAL INCISION TO BREAST MADE   Leg swelling    Panic disorder    Personal history of radiation therapy    S/P radiation therapy 08/17/11 - 09/28/11   LLQ - 50 Gy/25 Fractions with Boost of 10 Gy / 5 fractions   Sialoadenitis of submandibular gland 2016   Vitamin D deficiency     Past Surgical History:  Procedure Laterality Date   BIOPSY BREAST     left breast  as a teenager  benign   BREAST BIOPSY Left 06/06/2011   BREAST EXCISIONAL BIOPSY Bilateral    BREAST LUMPECTOMY Left 2013   BREAST SURGERY  07/05/11   left breast lumpectomy with needle loc & axillary sln bx   BUNIONECTOMY  2011   bilateral by Dr Little Ishikawa    CHOLECYSTECTOMY  02/2019   COLONOSCOPY     polyp   cyst removed from right hand  [place]  2000   Dr Gae Gallop FINGER RELEASE  2008   Dr Burney Gauze    Social History   Socioeconomic History   Marital  status: Married    Spouse name: Not on file   Number of children: 2   Years of education: Not on file   Highest education level: Not on file  Occupational History   Occupation: retired  Tobacco Use   Smoking status: Every Day    Packs/day: 0.50    Types: Cigarettes    Last attempt to quit: 04/16/2012    Years since quitting: 8.7   Smokeless tobacco: Never   Tobacco comments:    STOPPED AND RESTARTED  Vaping Use   Vaping Use: Never used  Substance and Sexual Activity   Alcohol use: Yes    Alcohol/week: 0.0 - 1.0 standard drinks    Comment: social   Drug use: No   Sexual activity: Not Currently    Birth control/protection: Surgical    Comment: menses age 35,hrt started  June 01, 1998  Other Topics Concern   Not on file  Social History Narrative   Widowed - husband died 06-01-03   2 step-children   Exercises 3x per week   2 cups of caffeine daily         Social Determinants of Health   Financial Resource Strain: Not on file  Food Insecurity: Not on file  Transportation Needs: Not on file  Physical Activity: Not on file  Stress: Not on file  Social Connections: Not on file    Family History  Problem Relation Age of Onset   Anemia Mother    Other Mother        + H Pylori   Seizures Mother    Prostate cancer Father        dx over 26   Colon cancer Father 8       diagnosed 2007/06/01   Bladder Cancer Father        dx over 45   Colon polyps Father    Breast cancer Sister        dx in her 64s; mat half sister   Pancreatic cancer Sister 67       d. 19   Lung cancer Sister    Sarcoidosis Sister        mat 1/2 sister   Multiple sclerosis Brother        #2   Hypertension Brother        #1    Hyperlipidemia Brother    Prostate cancer Brother        dx under 6   Breast cancer Cousin        pat first cousin d. 70   Dementia Maternal Aunt    Seizures Maternal Uncle    Lung cancer Paternal Aunt    Brain cancer Other        benign   Dementia Maternal Aunt    Lung cancer Maternal Uncle    Sarcoidosis Cousin        mat first cousin   Breast cancer Cousin        maternal 2nd cousins - distant   Esophageal cancer Neg Hx    Stomach cancer Neg Hx    Rectal cancer Neg Hx     Review of Systems  Constitutional:  Negative for fever.  HENT:  Positive for congestion (improved). Negative for sore throat.   Respiratory:  Positive for cough (some production), chest tightness (a little) and shortness of breath. Negative for wheezing.   Cardiovascular:  Positive for leg swelling. Negative for chest pain.  Neurological:  Positive for light-headedness and headaches (occ). Negative for dizziness.  Objective:   Vitals:   01/14/21 0808  BP: (!) 158/92  Pulse: 89  Temp: 98.1 F (36.7 C)  SpO2: 97%   BP Readings from Last 3 Encounters:  01/14/21 (!) 158/92  01/09/21 (!) 143/83  01/05/21 (!) 174/82   Wt Readings from Last 3 Encounters:  01/14/21 203 lb (92.1 kg)  01/09/21 200 lb (90.7 kg)  01/05/21 207 lb 12.8 oz (94.3 kg)   Body mass index is 33.78 kg/m.   Physical Exam    Constitutional: Appears well-developed and well-nourished. No distress.  HENT:  Head: Normocephalic and atraumatic.  Neck: Neck supple. No tracheal deviation present. No thyromegaly present.  No cervical lymphadenopathy Cardiovascular: Normal rate, regular rhythm and normal heart sounds.  No murmur heard. No carotid bruit .  Mild b/l LE edema Pulmonary/Chest: Effort normal and breath sounds normal. No respiratory distress. Minimal exp wheezes left side. No rales.  Skin: Skin is warm and dry. Not diaphoretic.  Psychiatric: Normal mood and affect. Behavior is normal.   Lab Results  Component Value  Date   WBC 5.9 01/09/2021   HGB 14.0 01/09/2021   HCT 45.6 01/09/2021   PLT 142 (L) 01/09/2021   GLUCOSE 119 (H) 01/09/2021   CHOL 209 (H) 12/28/2020   TRIG 70.0 12/28/2020   HDL 69.20 12/28/2020   LDLDIRECT 118.9 12/18/2011   LDLCALC 126 (H) 12/28/2020   ALT 11 12/28/2020   AST 14 12/28/2020   NA 138 01/09/2021   K 4.1 01/09/2021   CL 105 01/09/2021   CREATININE 0.80 01/09/2021   BUN 14 01/09/2021   CO2 23 01/09/2021   TSH 1.25 12/28/2020    DG Chest 2 View CLINICAL DATA:  Productive cough and dyspnea  EXAM: CHEST - 2 VIEW  COMPARISON:  August 26, 2020  FINDINGS: The heart size and mediastinal contours are within normal limits. Both lungs are clear. The visualized skeletal structures are unremarkable. Surgical clips are projected over the left breast/chest.  IMPRESSION: No active cardiopulmonary disease.  Electronically Signed   By: Abelardo Diesel M.D.   On: 01/09/2021 13:15    Assessment & Plan:    COPD exacerbation: Acute Seen in ED 11/13 - some improvement but not tolerating the augmentin and still with SOB that is limiting her ADL's after completing the prednisone Minimal wheeze on exam Stop augmentin - having diarrhea Start azithromycin 500 mg daily x 3 days Prednisone taper 40 mg x 2 days, 30 mg x 2 days, 20 mg x 2 days, 10 mg x 2 days Continue home inhalers  Hypertension: New - BP has been elevated intermittently Start hctz 12.5 mg daily Monitor BP at home Stressed low sodium diet, regular exercise and weight loss F/u with PCP in 4 weeks

## 2021-01-14 ENCOUNTER — Other Ambulatory Visit: Payer: Self-pay

## 2021-01-14 ENCOUNTER — Ambulatory Visit: Payer: No Typology Code available for payment source | Admitting: Internal Medicine

## 2021-01-14 VITALS — BP 158/92 | HR 89 | Temp 98.1°F | Ht 65.0 in | Wt 203.0 lb

## 2021-01-14 DIAGNOSIS — J441 Chronic obstructive pulmonary disease with (acute) exacerbation: Secondary | ICD-10-CM | POA: Diagnosis not present

## 2021-01-14 DIAGNOSIS — I10 Essential (primary) hypertension: Secondary | ICD-10-CM | POA: Diagnosis not present

## 2021-01-14 MED ORDER — AZITHROMYCIN 500 MG PO TABS: 500.0000 mg | ORAL_TABLET | Freq: Every day | ORAL | 0 refills | Status: DC

## 2021-01-14 MED ORDER — PREDNISONE 10 MG PO TABS: ORAL_TABLET | ORAL | 0 refills | Status: DC

## 2021-01-14 MED ORDER — HYDROCHLOROTHIAZIDE 12.5 MG PO TABS: 12.5000 mg | ORAL_TABLET | Freq: Every day | ORAL | 3 refills | Status: DC

## 2021-01-14 NOTE — Patient Instructions (Addendum)
Your BP goal is consistently less than 140/90   Medications changes include :   stop Augmentin.  Take azithromycin 500 mg daily x 3 days. Prednisone taper .  Start hydrochlorothiazide 12.5 mg daily  Your prescription(s) have been submitted to your pharmacy. Please take as directed and contact our office if you believe you are having problem(s) with the medication(s).   Hypertension, Adult High blood pressure (hypertension) is when the force of blood pumping through the arteries is too strong. The arteries are the blood vessels that carry blood from the heart throughout the body. Hypertension forces the heart to work harder to pump blood and may cause arteries to become narrow or stiff. Untreated or uncontrolled hypertension can cause a heart attack, heart failure, a stroke, kidney disease, and other problems. A blood pressure reading consists of a higher number over a lower number. Ideally, your blood pressure should be below 120/80. The first ("top") number is called the systolic pressure. It is a measure of the pressure in your arteries as your heart beats. The second ("bottom") number is called the diastolic pressure. It is a measure of the pressure in your arteries as the heart relaxes. What are the causes? The exact cause of this condition is not known. There are some conditions that result in or are related to high blood pressure. What increases the risk? Some risk factors for high blood pressure are under your control. The following factors may make you more likely to develop this condition: Smoking. Having type 2 diabetes mellitus, high cholesterol, or both. Not getting enough exercise or physical activity. Being overweight. Having too much fat, sugar, calories, or salt (sodium) in your diet. Drinking too much alcohol. Some risk factors for high blood pressure may be difficult or impossible to change. Some of these factors include: Having chronic kidney disease. Having a family  history of high blood pressure. Age. Risk increases with age. Race. You may be at higher risk if you are African American. Gender. Men are at higher risk than women before age 78. After age 68, women are at higher risk than men. Having obstructive sleep apnea. Stress. What are the signs or symptoms? High blood pressure may not cause symptoms. Very high blood pressure (hypertensive crisis) may cause: Headache. Anxiety. Shortness of breath. Nosebleed. Nausea and vomiting. Vision changes. Severe chest pain. Seizures. How is this diagnosed? This condition is diagnosed by measuring your blood pressure while you are seated, with your arm resting on a flat surface, your legs uncrossed, and your feet flat on the floor. The cuff of the blood pressure monitor will be placed directly against the skin of your upper arm at the level of your heart. It should be measured at least twice using the same arm. Certain conditions can cause a difference in blood pressure between your right and left arms. Certain factors can cause blood pressure readings to be lower or higher than normal for a short period of time: When your blood pressure is higher when you are in a health care provider's office than when you are at home, this is called white coat hypertension. Most people with this condition do not need medicines. When your blood pressure is higher at home than when you are in a health care provider's office, this is called masked hypertension. Most people with this condition may need medicines to control blood pressure. If you have a high blood pressure reading during one visit or you have normal blood pressure with other  risk factors, you may be asked to: Return on a different day to have your blood pressure checked again. Monitor your blood pressure at home for 1 week or longer. If you are diagnosed with hypertension, you may have other blood or imaging tests to help your health care provider understand your  overall risk for other conditions. How is this treated? This condition is treated by making healthy lifestyle changes, such as eating healthy foods, exercising more, and reducing your alcohol intake. Your health care provider may prescribe medicine if lifestyle changes are not enough to get your blood pressure under control, and if: Your systolic blood pressure is above 130. Your diastolic blood pressure is above 80. Your personal target blood pressure may vary depending on your medical conditions, your age, and other factors. Follow these instructions at home: Eating and drinking  Eat a diet that is high in fiber and potassium, and low in sodium, added sugar, and fat. An example eating plan is called the DASH (Dietary Approaches to Stop Hypertension) diet. To eat this way: Eat plenty of fresh fruits and vegetables. Try to fill one half of your plate at each meal with fruits and vegetables. Eat whole grains, such as whole-wheat pasta, brown rice, or whole-grain bread. Fill about one fourth of your plate with whole grains. Eat or drink low-fat dairy products, such as skim milk or low-fat yogurt. Avoid fatty cuts of meat, processed or cured meats, and poultry with skin. Fill about one fourth of your plate with lean proteins, such as fish, chicken without skin, beans, eggs, or tofu. Avoid pre-made and processed foods. These tend to be higher in sodium, added sugar, and fat. Reduce your daily sodium intake. Most people with hypertension should eat less than 1,500 mg of sodium a day. Do not drink alcohol if: Your health care provider tells you not to drink. You are pregnant, may be pregnant, or are planning to become pregnant. If you drink alcohol: Limit how much you use to: 0-1 drink a day for women. 0-2 drinks a day for men. Be aware of how much alcohol is in your drink. In the [place] one drink equals one 12 oz bottle of beer (355 mL), one 5 oz glass of wine (148 mL), or one 1 oz glass of hard  liquor (44 mL). Lifestyle  Work with your health care provider to maintain a healthy body weight or to lose weight. Ask what an ideal weight is for you. Get at least 30 minutes of exercise most days of the week. Activities may include walking, swimming, or biking. Include exercise to strengthen your muscles (resistance exercise), such as Pilates or lifting weights, as part of your weekly exercise routine. Try to do these types of exercises for 30 minutes at least 3 days a week. Do not use any products that contain nicotine or tobacco, such as cigarettes, e-cigarettes, and chewing tobacco. If you need help quitting, ask your health care provider. Monitor your blood pressure at home as told by your health care provider. Keep all follow-up visits as told by your health care provider. This is important. Medicines Take over-the-counter and prescription medicines only as told by your health care provider. Follow directions carefully. Blood pressure medicines must be taken as prescribed. Do not skip doses of blood pressure medicine. Doing this puts you at risk for problems and can make the medicine less effective. Ask your health care provider about side effects or reactions to medicines that you should watch for. Contact a  health care provider if you: Think you are having a reaction to a medicine you are taking. Have headaches that keep coming back (recurring). Feel dizzy. Have swelling in your ankles. Have trouble with your vision. Get help right away if you: Develop a severe headache or confusion. Have unusual weakness or numbness. Feel faint. Have severe pain in your chest or abdomen. Vomit repeatedly. Have trouble breathing. Summary Hypertension is when the force of blood pumping through your arteries is too strong. If this condition is not controlled, it may put you at risk for serious complications. Your personal target blood pressure may vary depending on your medical conditions, your  age, and other factors. For most people, a normal blood pressure is less than 120/80. Hypertension is treated with lifestyle changes, medicines, or a combination of both. Lifestyle changes include losing weight, eating a healthy, low-sodium diet, exercising more, and limiting alcohol. This information is not intended to replace advice given to you by your health care provider. Make sure you discuss any questions you have with your health care provider. Document Revised: 10/24/2017 Document Reviewed: 10/24/2017 Elsevier Patient Education  [place].

## 2021-01-28 ENCOUNTER — Ambulatory Visit (INDEPENDENT_AMBULATORY_CARE_PROVIDER_SITE_OTHER)
Admission: RE | Admit: 2021-01-28 | Discharge: 2021-01-28 | Disposition: A | Discharge status: Home / Self Care | Payer: Self-pay | Source: Ambulatory Visit | Attending: Internal Medicine | Admitting: Internal Medicine

## 2021-01-28 ENCOUNTER — Other Ambulatory Visit: Payer: Self-pay

## 2021-01-28 DIAGNOSIS — E785 Hyperlipidemia, unspecified: Secondary | ICD-10-CM

## 2021-01-30 ENCOUNTER — Encounter: Payer: Self-pay | Admitting: Internal Medicine

## 2021-01-30 DIAGNOSIS — I700 Atherosclerosis of aorta: Secondary | ICD-10-CM | POA: Insufficient documentation

## 2021-01-30 DIAGNOSIS — I2510 Atherosclerotic heart disease of native coronary artery without angina pectoris: Secondary | ICD-10-CM | POA: Insufficient documentation

## 2021-01-31 ENCOUNTER — Other Ambulatory Visit: Payer: Self-pay | Admitting: Internal Medicine

## 2021-01-31 ENCOUNTER — Encounter: Payer: Self-pay | Admitting: Internal Medicine

## 2021-01-31 ENCOUNTER — Telehealth: Payer: Self-pay | Admitting: Internal Medicine

## 2021-01-31 NOTE — Telephone Encounter (Signed)
Patient states she was prescribed a bp medication at her last visit w/

## 2021-01-31 NOTE — Telephone Encounter (Signed)
Continue) Dr. Quay Burow'  Patient states since being on the medication her heart rate has increased  Patient is requesting a call back to discuss medication

## 2021-01-31 NOTE — Telephone Encounter (Signed)
Pt states Dr. Quay Burow rx Hydrochlorothiazide 12.5 mg once a day. She states since taking her HR has been pounding. She states it's not everyday but everyone other day. She can be sitting and her heart beat real fast. She states she had not smoke in 3 weeks, and its not like when she having a panic attack.Marland KitchenJohny Chess

## 2021-01-31 NOTE — Telephone Encounter (Signed)
Please discontinue HCTZ and see me or Dr. Quay Burow this week. Thanks

## 2021-02-01 NOTE — Telephone Encounter (Signed)
Spoke with patient today and appointment made with Dr. Quay Burow for tomorrow at 3:40 pm.

## 2021-02-01 NOTE — Progress Notes (Signed)
Subjective:    Patient ID: Marilyn [name], female    DOB: 07/14/1955, 65 y.o.   MRN: [id number]  This visit occurred during the SARS-CoV-2 public health emergency.  Safety protocols were in place, including screening questions prior to the visit, additional usage of staff PPE, and extensive cleaning of exam room while observing appropriate contact time as indicated for disinfecting solutions.    HPI The patient is here for an acute visit.   Htn - I started her on hctz 12. 5 mg daily on 11/18 for elevated BP.  She was also here for follow-up from emergency room and was still experiencing symptoms of her COPD exacerbation.  I did also place her on a prednisone taper, few additional days of azithromycin.  She is still taking the hctz.  She was not aware that she was post to discontinue it.  Since going to the ED she has felt jittery.  She was on prednisone and wonders if that was it.  She would have to take the clonazepam to calm herself down.  She would get some heart racing intermittently- forceful and regular -this was very transient - lasts a couple of seconds.      Last night she had a panic attack.  She did take the clonazepam and it helped.  Stress level has been a little higher.  She stopped smoking cold [place], which could also be contributing.    She denies coughing, wheezing, shortness of breath above and beyond her baseline, chest pain.  She denies feeling any irregular beats or fast beats.  Her heart rate was just more forceful.  She denies any lightheadedness or dizziness.   Medications and allergies reviewed with patient and updated if appropriate.  Patient Active Problem List   Diagnosis Date Noted   Coronary atherosclerosis 01/30/2021   Atherosclerosis of aorta ([place]) 01/30/2021   Primary hypertension 01/14/2021   COPD mixed type ([place]) 09/30/2019   Knee pain, left 08/28/2019   Elevated BP without diagnosis of hypertension 08/28/2019   Callus of foot  08/28/2019   Cholelithiasis 12/26/2018   Genetic testing 12/26/2017   Family history of breast cancer    Family history of pancreatic cancer    Family history of colon cancer    Abdominal pain 07/28/2016   Tobacco use disorder 05/08/2016   Obesity 03/13/2016   Dyspnea 11/09/2015   Well adult exam 07/23/2014   Sialoadenitis 06/02/2014   Edema 07/07/2013   AB (asthmatic bronchitis) 06/23/2013   Anxiety    Malignant neoplasm of upper-outer quadrant of left breast in female, estrogen receptor positive ([place]) 06/09/2011   COLONIC POLYPS 01/25/2010   DIVERTICULOSIS OF COLON 01/25/2010   DEGENERATIVE JOINT DISEASE 11/06/2008   PANIC DISORDER 07/23/2008   Sarcoidosis 10/13/2007   Vitamin D deficiency 10/13/2007   ANEMIA 10/13/2007   BRONCHITIS 10/13/2007   Dyslipidemia 04/23/2007   Adjustment disorder with mixed anxiety and depressed mood 04/23/2007   Venous (peripheral) insufficiency 04/23/2007   HEADACHE 04/23/2007    Current Outpatient Medications on File Prior to Visit  Medication Sig Dispense Refill   albuterol (VENTOLIN HFA) 108 (90 Base) MCG/ACT inhaler Inhale 2 puffs into the lungs every 6 (six) hours as needed for wheezing or shortness of breath. 18 g 11   budesonide-formoterol (SYMBICORT) 80-4.5 MCG/ACT inhaler TAKE 2 PUFFS BY MOUTH TWICE A DAY 10.2 each 11   busPIRone (BUSPAR) 30 MG tablet TAKE 1/2 TO 1 (ONE-HALF TO ONE) TABLET BY MOUTH TWICE DAILY 60 tablet 5  celecoxib (CELEBREX) 200 MG capsule Take 1 capsule (200 mg total) by mouth daily as needed for moderate pain. 30 capsule 3   Cholecalciferol 5000 UNITS TABS Take 1 tablet by mouth 3 (three) times a week.     clobetasol cream (TEMOVATE) 9.50 % Apply 1 application topically 2 (two) times daily. (Patient taking differently: Apply 1 application topically 2 (two) times daily as needed.) 60 g 3   clonazePAM (KLONOPIN) 0.5 MG tablet TAKE 1/2 TO 1 TABLET BY MOUTH TWICE A DAY AS NEEDED 60 tablet 2   diclofenac Sodium  (VOLTAREN) 1 % GEL Apply 4 g topically 4 (four) times daily.     ipratropium (ATROVENT) 0.03 % nasal spray Place 2 sprays into both nostrils 2 (two) times daily as needed for rhinitis. 30 mL 0   Multiple Vitamin (MULTIVITAMIN WITH MINERALS) TABS tablet Take 1 tablet by mouth daily.     [DISCONTINUED] pantoprazole (PROTONIX) 40 MG tablet Take 1 tablet (40 mg total) by mouth daily. 90 tablet 3   No current facility-administered medications on file prior to visit.    Past Medical History:  Diagnosis Date   Allergy    Anemia    hx   Anxiety    panic disorder   Breast cancer ([place]) 06/06/2011   bc  left breast 3 o'clock dx=invasive ductal ca uoqER/PR=positive   Cancer ([facility])    BREAST - left   Cigarette smoker    Colon polyp    Complication of anesthesia    DIFFICULTY AWAKENING, BP INCREASE AFTER CHOLECYSTECTEMY    COPD (chronic obstructive pulmonary disease) ([facility])    Depression    Diverticulosis of colon    DJD (degenerative joint disease)    DJD (degenerative joint disease)    Family history of breast cancer    Family history of colon cancer    Family history of pancreatic cancer    Family history of prostate cancer    Gallstones    GERD (gastroesophageal reflux disease)    Pt. denies having GERD. Unable to remove it.   Headache(784.0)    History of anemia    History of bronchitis    History of sarcoidosis    Hyperlipidemia    no meds needed   Incisional breast wound    AT AGE 74 BILATERAL INCISION TO BREAST MADE   Leg swelling    Panic disorder    Personal history of radiation therapy    S/P radiation therapy 08/17/11 - 09/28/11   LLQ - 50 Gy/25 Fractions with Boost of 10 Gy / 5 fractions   Sialoadenitis of submandibular gland 2016   Vitamin D deficiency     Past Surgical History:  Procedure Laterality Date   BIOPSY BREAST     left breast  as a teenager  benign   BREAST BIOPSY Left 06/06/2011   BREAST EXCISIONAL BIOPSY Bilateral    BREAST LUMPECTOMY Left 2013    BREAST SURGERY  07/05/11   left breast lumpectomy with needle loc & axillary sln bx   BUNIONECTOMY  2011   bilateral by Dr Little Ishikawa   CHOLECYSTECTOMY  02/2019   COLONOSCOPY     polyp   cyst removed from right hand  [place]  2000   Dr Gae Gallop FINGER RELEASE  2008   Dr Burney Gauze    Social History   Socioeconomic History   Marital status: Married    Spouse  name: Not on file   Number of children: 2   Years of education: Not on file   Highest education level: Not on file  Occupational History   Occupation: retired  Tobacco Use   Smoking status: Every Day    Packs/day: 0.50    Types: Cigarettes    Last attempt to quit: 04/16/2012    Years since quitting: 8.8   Smokeless tobacco: Never   Tobacco comments:    STOPPED AND RESTARTED  Vaping Use   Vaping Use: Never used  Substance and Sexual Activity   Alcohol use: Yes    Alcohol/week: 0.0 - 1.0 standard drinks    Comment: social   Drug use: No   Sexual activity: Not Currently    Birth control/protection: Surgical    Comment: menses age 48,hrt started  06/01/1998  Other Topics Concern   Not on file  Social History Narrative   Widowed - husband died 06-01-03   2 step-children   Exercises 3x per week   2 cups of caffeine daily         Social Determinants of Health   Financial Resource Strain: Not on file  Food Insecurity: Not on file  Transportation Needs: Not on file  Physical Activity: Not on file  Stress: Not on file  Social Connections: Not on file    Family History  Problem Relation Age of Onset   Anemia Mother    Other Mother        + H Pylori   Seizures Mother    Prostate cancer Father        dx over 52   Colon cancer Father 61       diagnosed 06-01-07   Bladder Cancer Father        dx over 55   Colon polyps Father    Breast cancer Sister        dx in her 82s; mat half sister   Pancreatic cancer Sister 7       d. 69   Lung cancer Sister     Sarcoidosis Sister        mat 1/2 sister   Multiple sclerosis Brother        #2   Hypertension Brother        #1   Hyperlipidemia Brother    Prostate cancer Brother        dx under 51   Breast cancer Cousin        pat first cousin d. 2   Dementia Maternal Aunt    Seizures Maternal Uncle    Lung cancer Paternal Aunt    Brain cancer Other        benign   Dementia Maternal Aunt    Lung cancer Maternal Uncle    Sarcoidosis Cousin        mat first cousin   Breast cancer Cousin        maternal 2nd cousins - distant   Esophageal cancer Neg Hx    Stomach cancer Neg Hx    Rectal cancer Neg Hx     Review of Systems  Constitutional:  Negative for chills and fever.  Respiratory:  Positive for shortness of breath (only with overexertion). Negative for cough and wheezing.   Cardiovascular:  Positive for leg swelling (minimal). Negative for chest pain and palpitations (heart racing earlier today).  Neurological:  Positive for headaches (on left side -not new). Negative for dizziness and light-headedness.      Objective:   Vitals:  02/02/21 1550  BP: (!) 144/80  Pulse: 69  Temp: 98.3 F (36.8 C)  SpO2: 97%   BP Readings from Last 3 Encounters:  02/02/21 (!) 144/80  01/14/21 (!) 158/92  01/09/21 (!) 143/83   Wt Readings from Last 3 Encounters:  02/02/21 206 lb (93.4 kg)  01/14/21 203 lb (92.1 kg)  01/09/21 200 lb (90.7 kg)   Body mass index is 34.28 kg/m.   Physical Exam    Constitutional: Appears well-developed and well-nourished. No distress.  Head: Normocephalic and atraumatic.  Neck: Neck supple. No tracheal deviation present. No thyromegaly present.  No cervical lymphadenopathy Cardiovascular: Normal rate, regular rhythm and normal heart sounds.  No murmur heard.   No edema Pulmonary/Chest: Effort normal and breath sounds normal. No respiratory distress. No has no wheezes. No rales.  Skin: Skin is warm and dry. Not diaphoretic.  Psychiatric: Normal mood and  affect. Behavior is normal.       Assessment & Plan:    Heart racing/forceful heart beat: Acute Has been experiencing some heart racing-forceful, but regular heartbeats intermittently but have been transient-only lasting a couple of seconds.  Denies any irregular heartbeat or fast heartbeat ?  Related to increased stress/anxiety ?  Related to recent prednisone-if this is the cause symptoms should go away since she is no longer on the medication Less likely related to hydrochlorothiazide Exam is normal, will hold off on EKG since ear exam is normal and it does not sound like true palpitations   Anxiety: Chronic anxiety with panic attacks Stress level has been higher recently with family members being sick that she is caring for Taking buspirone once a day-she will increase this to twice a day Continue clonazepam as needed Discussed daily SSRI-she declined for now Discussed that she needs to recognize she is under increased stress and do things that will help reduce her stress level such as exercise, deep breathing, etc.  Hypertension: Started her on hydrochlorothiazide at her last visit-possibly causing forceful heart beat, but this seems less likely and the 2 other causes We will have her discontinue it just to make sure if that is not the cause Start Bystolic 5 mg daily She will monitor her blood pressure at home  Has follow-up with Dr. Alain Marion on 12/22.  We will call sooner with any concerns or questions

## 2021-02-02 ENCOUNTER — Encounter: Payer: Self-pay | Admitting: Internal Medicine

## 2021-02-02 ENCOUNTER — Other Ambulatory Visit: Payer: Self-pay

## 2021-02-02 ENCOUNTER — Ambulatory Visit: Payer: No Typology Code available for payment source | Admitting: Internal Medicine

## 2021-02-02 VITALS — BP 144/80 | HR 69 | Temp 98.3°F | Ht 65.0 in | Wt 206.0 lb

## 2021-02-02 DIAGNOSIS — F419 Anxiety disorder, unspecified: Secondary | ICD-10-CM | POA: Diagnosis not present

## 2021-02-02 DIAGNOSIS — I10 Essential (primary) hypertension: Secondary | ICD-10-CM | POA: Diagnosis not present

## 2021-02-02 DIAGNOSIS — R000 Tachycardia, unspecified: Secondary | ICD-10-CM | POA: Diagnosis not present

## 2021-02-02 MED ORDER — NEBIVOLOL HCL 5 MG PO TABS: 5.0000 mg | ORAL_TABLET | Freq: Every day | ORAL | 5 refills | Status: DC

## 2021-02-02 NOTE — Patient Instructions (Signed)
   Monitor your BP   Medications changes include :   stop the hctz. Start bystolic 5 mg daily   Your prescription(s) have been submitted to your pharmacy. Please take as directed and contact our office if you believe you are having problem(s) with the medication(s).    Work on Writer.

## 2021-02-17 ENCOUNTER — Encounter: Payer: Self-pay | Admitting: Internal Medicine

## 2021-02-17 ENCOUNTER — Other Ambulatory Visit: Payer: Self-pay

## 2021-02-17 ENCOUNTER — Ambulatory Visit: Payer: No Typology Code available for payment source | Admitting: Internal Medicine

## 2021-02-17 DIAGNOSIS — E785 Hyperlipidemia, unspecified: Secondary | ICD-10-CM

## 2021-02-17 DIAGNOSIS — I2510 Atherosclerotic heart disease of native coronary artery without angina pectoris: Secondary | ICD-10-CM

## 2021-02-17 DIAGNOSIS — Z6836 Body mass index (BMI) 36.0-36.9, adult: Secondary | ICD-10-CM

## 2021-02-17 DIAGNOSIS — I2583 Coronary atherosclerosis due to lipid rich plaque: Secondary | ICD-10-CM

## 2021-02-17 DIAGNOSIS — I700 Atherosclerosis of aorta: Secondary | ICD-10-CM

## 2021-02-17 DIAGNOSIS — K1120 Sialoadenitis, unspecified: Secondary | ICD-10-CM

## 2021-02-17 DIAGNOSIS — J449 Chronic obstructive pulmonary disease, unspecified: Secondary | ICD-10-CM

## 2021-02-17 DIAGNOSIS — F410 Panic disorder [episodic paroxysmal anxiety] without agoraphobia: Secondary | ICD-10-CM

## 2021-02-17 MED ORDER — ALPRAZOLAM 1 MG PO TABS: 0.5000 mg | ORAL_TABLET | Freq: Three times a day (TID) | ORAL | 2 refills | Status: DC | PRN

## 2021-02-17 NOTE — Assessment & Plan Note (Signed)
Relapsed Lemon in water F/u w/Dr Constance Holster if not resolved

## 2021-02-17 NOTE — Assessment & Plan Note (Addendum)
Declined statins On diet. Fish oil Pt stopped smoking 12/2020

## 2021-02-17 NOTE — Assessment & Plan Note (Addendum)
Pt declined statins. Start fish oil Pt stopped smoking 12/2020

## 2021-02-17 NOTE — Assessment & Plan Note (Signed)
Wt Readings from Last 3 Encounters:  02/17/21 210 lb 6.4 oz (95.4 kg)  02/02/21 206 lb (93.4 kg)  01/14/21 203 lb (92.1 kg)

## 2021-02-17 NOTE — Assessment & Plan Note (Signed)
Start Xanax prn - it worked better  Potential benefits of a long term benzodiazepines  use as well as potential risks  and complications were explained to the patient and were aknowledged.

## 2021-02-17 NOTE — Assessment & Plan Note (Signed)
Declined statins 12/22 coronary calcium CT score is 79 Start fish oil

## 2021-02-17 NOTE — Assessment & Plan Note (Signed)
°  Pt stopped smoking 12/2020

## 2021-02-28 NOTE — Progress Notes (Signed)
Subjective:  Patient ID: Marilyn [name], female    DOB: November 01, 1955  Age: 66 y.o. MRN: [id number]  CC: Follow-up (4 WEEK F/U)   HPI Marilyn [name] presents for elevated coronary calcium, anxiety, tobacco smoking.  The patient stopped smoking in November of this year because of the CT scan findings.  She is anxious.  Outpatient Medications Prior to Visit  Medication Sig Dispense Refill   albuterol (VENTOLIN HFA) 108 (90 Base) MCG/ACT inhaler Inhale 2 puffs into the lungs every 6 (six) hours as needed for wheezing or shortness of breath. 18 g 11   budesonide-formoterol (SYMBICORT) 80-4.5 MCG/ACT inhaler TAKE 2 PUFFS BY MOUTH TWICE A DAY 10.2 each 11   busPIRone (BUSPAR) 30 MG tablet TAKE 1/2 TO 1 (ONE-HALF TO ONE) TABLET BY MOUTH TWICE DAILY 60 tablet 5   celecoxib (CELEBREX) 200 MG capsule Take 1 capsule (200 mg total) by mouth daily as needed for moderate pain. 30 capsule 3   Cholecalciferol 5000 UNITS TABS Take 1 tablet by mouth 3 (three) times a week.     clobetasol cream (TEMOVATE) 2.95 % Apply 1 application topically 2 (two) times daily. (Patient taking differently: Apply 1 application topically 2 (two) times daily as needed.) 60 g 3   diclofenac Sodium (VOLTAREN) 1 % GEL Apply 4 g topically 4 (four) times daily.     ipratropium (ATROVENT) 0.03 % nasal spray Place 2 sprays into both nostrils 2 (two) times daily as needed for rhinitis. 30 mL 0   Multiple Vitamin (MULTIVITAMIN WITH MINERALS) TABS tablet Take 1 tablet by mouth daily.     nebivolol (BYSTOLIC) 5 MG tablet Take 1 tablet (5 mg total) by mouth daily. 30 tablet 5   clonazePAM (KLONOPIN) 0.5 MG tablet TAKE 1/2 TO 1 TABLET BY MOUTH TWICE A DAY AS NEEDED 60 tablet 2   No facility-administered medications prior to visit.    ROS: Review of Systems  Constitutional:  Positive for unexpected weight change. Negative for activity change, appetite change, chills and fatigue.  HENT:  Negative for congestion, mouth sores  and sinus pressure.   Eyes:  Negative for visual disturbance.  Respiratory:  Negative for cough and chest tightness.   Gastrointestinal:  Negative for abdominal pain and nausea.  Genitourinary:  Negative for difficulty urinating, frequency and vaginal pain.  Musculoskeletal:  Negative for back pain and gait problem.  Skin:  Negative for pallor and rash.  Neurological:  Negative for dizziness, tremors, weakness, numbness and headaches.  Psychiatric/Behavioral:  Negative for confusion and sleep disturbance. The patient is nervous/anxious.    Objective:  BP (!) 148/72 (BP Location: Right Arm)    Pulse 65    Temp 98.5 F (36.9 C) (Oral)    Ht 5\' 5"  (1.651 m)    Wt 210 lb 6.4 oz (95.4 kg)    SpO2 97%    BMI 35.01 kg/m   BP Readings from Last 3 Encounters:  02/17/21 (!) 148/72  02/02/21 (!) 144/80  01/14/21 (!) 158/92    Wt Readings from Last 3 Encounters:  02/17/21 210 lb 6.4 oz (95.4 kg)  02/02/21 206 lb (93.4 kg)  01/14/21 203 lb (92.1 kg)    Physical Exam Constitutional:      General: She is not in acute distress.    Appearance: She is well-developed. She is obese.  HENT:     Head: Normocephalic.     Right Ear: External ear normal.     Left Ear: External ear normal.  Nose: Nose normal.  Eyes:     General:        Right eye: No discharge.        Left eye: No discharge.     Conjunctiva/sclera: Conjunctivae normal.     Pupils: Pupils are equal, round, and reactive to light.  Neck:     Thyroid: No thyromegaly.     Vascular: No JVD.     Trachea: No tracheal deviation.  Cardiovascular:     Rate and Rhythm: Normal rate and regular rhythm.     Heart sounds: Normal heart sounds.  Pulmonary:     Effort: No respiratory distress.     Breath sounds: No stridor. No wheezing.  Abdominal:     General: Bowel sounds are normal. There is no distension.     Palpations: Abdomen is soft. There is no mass.     Tenderness: There is no abdominal tenderness. There is no guarding or  rebound.  Musculoskeletal:        General: No tenderness.     Cervical back: Normal range of motion and neck supple. No rigidity.  Lymphadenopathy:     Cervical: No cervical adenopathy.  Skin:    Findings: No erythema or rash.  Neurological:     Cranial Nerves: No cranial nerve deficit.     Motor: No abnormal muscle tone.     Coordination: Coordination normal.     Deep Tendon Reflexes: Reflexes normal.  Psychiatric:        Behavior: Behavior normal.        Thought Content: Thought content normal.        Judgment: Judgment normal.    Lab Results  Component Value Date   WBC 5.9 01/09/2021   HGB 14.0 01/09/2021   HCT 45.6 01/09/2021   PLT 142 (L) 01/09/2021   GLUCOSE 119 (H) 01/09/2021   CHOL 209 (H) 12/28/2020   TRIG 70.0 12/28/2020   HDL 69.20 12/28/2020   LDLDIRECT 118.9 12/18/2011   LDLCALC 126 (H) 12/28/2020   ALT 11 12/28/2020   AST 14 12/28/2020   NA 138 01/09/2021   K 4.1 01/09/2021   CL 105 01/09/2021   CREATININE 0.80 01/09/2021   BUN 14 01/09/2021   CO2 23 01/09/2021   TSH 1.25 12/28/2020    CT CARDIAC SCORING (SELF PAY ONLY)  Addendum Date: 01/28/2021   ADDENDUM REPORT: 01/28/2021 15:11 CLINICAL DATA:  65F for cardiovascular disease risk stratification EXAM: Coronary Calcium Score TECHNIQUE: A gated, non-contrast computed tomography scan of the heart was performed using 73mm slice thickness. Axial images were analyzed on a dedicated workstation. Calcium scoring of the coronary arteries was performed using the Agatston method. FINDINGS: Coronary arteries: Normal origins. Coronary Calcium Score: Left main: 26 Left anterior descending artery: 3.26 Left circumflex artery: 0 Right coronary artery: 50.4 Total: 79.6 Percentile: 82nd Pericardium: Normal. Ascending Aorta: Normal caliber. Mild calcification of the aortic root. Non-cardiac: See separate report from [facility]. IMPRESSION: Coronary calcium score of 79.6. This was 82nd percentile for age-, race-, and  sex-matched controls. Mild calcification of the aortic root. RECOMMENDATIONS: Coronary artery calcium (CAC) score is a strong predictor of incident coronary heart disease (CHD) and provides predictive information beyond traditional risk factors. CAC scoring is reasonable to use in the decision to withhold, postpone, or initiate statin therapy in intermediate-risk or selected borderline-risk asymptomatic adults (age 64-75 years and LDL-C >=70 to <190 mg/dL) who do not have diabetes or established atherosclerotic cardiovascular disease (ASCVD).* In intermediate-risk (10-year ASCVD risk >=7.5%  to <20%) adults or selected borderline-risk (10-year ASCVD risk >=5% to <7.5%) adults in whom a CAC score is measured for the purpose of making a treatment decision the following recommendations have been made: If CAC=0, it is reasonable to withhold statin therapy and reassess in 5 to 10 years, as long as higher risk conditions are absent (diabetes mellitus, family history of premature CHD in first degree relatives (males <55 years; females <65 years), cigarette smoking, or LDL >=190 mg/dL). If CAC is 1 to 99, it is reasonable to initiate statin therapy for patients >=33 years of age. If CAC is >=100 or >=75th percentile, it is reasonable to initiate statin therapy at any age. Cardiology referral should be considered for patients with CAC scores >=400 or >=75th percentile. *2018 AHA/ACC/AACVPR/AAPA/ABC/ACPM/ADA/AGS/APhA/ASPC/NLA/PCNA Guideline on the Management of Blood Cholesterol: A Report of the American College of Cardiology/American Heart Association Task Force on Clinical Practice Guidelines. J Am Coll Cardiol. 2019;73(24):3168-3209. Skeet Latch, MD Electronically Signed   By: Skeet Latch M.D.   On: 01/28/2021 15:11   Result Date: 01/28/2021 EXAM: OVER-READ INTERPRETATION  CT CHEST The following report is an over-read performed by radiologist Dr. Vinnie Langton of [facility], [place] on 01/28/2021. This  over-read does not include interpretation of cardiac or coronary anatomy or pathology. The coronary calcium score interpretation by the cardiologist is attached. COMPARISON:  None. FINDINGS: Atherosclerotic calcifications in the thoracic aorta. Scattered areas of mild linear scarring are noted in the lungs bilaterally. Within the visualized portions of the thorax there are no suspicious appearing pulmonary nodules or masses, there is no acute consolidative airspace disease, no pleural effusions, no pneumothorax and no lymphadenopathy. Visualized portions of the upper abdomen are unremarkable. There are no aggressive appearing lytic or blastic lesions noted in the visualized portions of the skeleton. IMPRESSION: 1. Aortic atherosclerosis. Electronically Signed: By: Vinnie Langton M.D. On: 01/28/2021 09:56    Assessment & Plan:   Problem List Items Addressed This Visit     Atherosclerosis of aorta ([place])    Declined statins On diet. Fish oil Pt stopped smoking 12/2020      COPD mixed type ([place])     Pt stopped smoking 12/2020      Coronary atherosclerosis    Pt declined statins. Start fish oil Pt stopped smoking 12/2020      Dyslipidemia    Declined statins 12/22 coronary calcium CT score is 79 Start fish oil       Obesity    Wt Readings from Last 3 Encounters:  02/17/21 210 lb 6.4 oz (95.4 kg)  02/02/21 206 lb (93.4 kg)  01/14/21 203 lb (92.1 kg)        PANIC DISORDER    Start Xanax prn - it worked better  Potential benefits of a long term benzodiazepines  use as well as potential risks  and complications were explained to the patient and were aknowledged.        Relevant Medications   ALPRAZolam (XANAX) 1 MG tablet   Sialoadenitis    Relapsed Lemon in water F/u w/Dr Constance Holster if not resolved         Meds ordered this encounter  Medications   ALPRAZolam (XANAX) 1 MG tablet    Sig: Take 0.5-1 tablets (0.5-1 mg total) by mouth 3 (three) times daily as needed for  anxiety.    Dispense:  90 tablet    Refill:  2      Follow-up: Return in about 2 months (around 04/20/2021) for a follow-up  visit.  Walker Kehr, MD

## 2021-03-10 ENCOUNTER — Encounter (HOSPITAL_COMMUNITY): Payer: Self-pay

## 2021-03-10 ENCOUNTER — Emergency Department (HOSPITAL_COMMUNITY)
Admission: EM | Admit: 2021-03-10 | Discharge: 2021-03-10 | Disposition: A | Discharge status: Home / Self Care | Payer: No Typology Code available for payment source | Attending: Emergency Medicine | Admitting: Emergency Medicine

## 2021-03-10 ENCOUNTER — Telehealth: Payer: Self-pay | Admitting: Internal Medicine

## 2021-03-10 ENCOUNTER — Emergency Department (HOSPITAL_COMMUNITY): Payer: No Typology Code available for payment source

## 2021-03-10 DIAGNOSIS — I10 Essential (primary) hypertension: Secondary | ICD-10-CM | POA: Diagnosis present

## 2021-03-10 LAB — BASIC METABOLIC PANEL
Anion gap: 7 (ref 5–15)
BUN: 10 mg/dL (ref 8–23)
CO2: 22 mmol/L (ref 22–32)
Calcium: 9.2 mg/dL (ref 8.9–10.3)
Chloride: 109 mmol/L (ref 98–111)
Creatinine, Ser: 0.7 mg/dL (ref 0.44–1.00)
GFR, Estimated: >60 mL/min (ref >60)
Glucose, Bld: 102 mg/dL — ABNORMAL HIGH (ref 70–99)
Potassium: 3.7 mmol/L (ref 3.5–5.1)
Sodium: 138 mmol/L (ref 135–145)

## 2021-03-10 LAB — URINALYSIS, ROUTINE W REFLEX MICROSCOPIC
Bilirubin Urine: NEGATIVE
Glucose, UA: NEGATIVE mg/dL
Hgb urine dipstick: NEGATIVE
Ketones, ur: NEGATIVE mg/dL
Leukocytes,Ua: NEGATIVE
Nitrite: NEGATIVE
Protein, ur: NEGATIVE mg/dL
Specific Gravity, Urine: 1.018 (ref 1.005–1.030)
pH: 6 (ref 5.0–8.0)

## 2021-03-10 LAB — CBC WITH DIFFERENTIAL/PLATELET
Abs Immature Granulocytes: 0.02 10*3/uL (ref 0.00–0.07)
Basophils Absolute: 0 10*3/uL (ref 0.0–0.1)
Basophils Relative: 1 %
Eosinophils Absolute: 0.3 10*3/uL (ref 0.0–0.5)
Eosinophils Relative: 5 %
HCT: 37.7 % (ref 36.0–46.0)
Hemoglobin: 11.7 g/dL — ABNORMAL LOW (ref 12.0–15.0)
Immature Granulocytes: 0 %
Lymphocytes Relative: 20 %
Lymphs Abs: 1.5 10*3/uL (ref 0.7–4.0)
MCH: 23.7 pg — ABNORMAL LOW (ref 26.0–34.0)
MCHC: 31 g/dL (ref 30.0–36.0)
MCV: 76.5 fL — ABNORMAL LOW (ref 80.0–100.0)
Monocytes Absolute: 0.6 10*3/uL (ref 0.1–1.0)
Monocytes Relative: 8 %
Neutro Abs: 5.1 10*3/uL (ref 1.7–7.7)
Neutrophils Relative %: 66 %
Platelets: 244 10*3/uL (ref 150–400)
RBC: 4.93 MIL/uL (ref 3.87–5.11)
RDW: 15.9 % — ABNORMAL HIGH (ref 11.5–15.5)
WBC: 7.6 10*3/uL (ref 4.0–10.5)
nRBC: 0 % (ref 0.0–0.2)

## 2021-03-10 LAB — TROPONIN I (HIGH SENSITIVITY)
Troponin I (High Sensitivity): 3 ng/L (ref <18)
Troponin I (High Sensitivity): 4 ng/L (ref <18)

## 2021-03-10 LAB — BRAIN NATRIURETIC PEPTIDE: B Natriuretic Peptide: 157.7 pg/mL — ABNORMAL HIGH (ref 0.0–100.0)

## 2021-03-10 NOTE — Discharge Instructions (Signed)
Follow-up with a cardiologist as discussed.  Call your doctor tomorrow to discuss any alterations in your blood pressure medication

## 2021-03-10 NOTE — ED Triage Notes (Signed)
Pt presents with c/o hypertension. Pt reports that she has noticed her blood pressure elevating since last night. Pt reports that she did notice her hands and ankles were swelling last week. Pt denies any consistent headache, intermittent at times. Pt has been diagnosed within the last month with hypertension.

## 2021-03-10 NOTE — ED Notes (Signed)
Pt in bed, pt states that she has some htn, states that they changed her blood pressure medication and since then she has had a low heart rate, states that she is also having some pain and tightness in her L arm.  Resps even and unlabored, pt sinus rhythm on monitor.

## 2021-03-10 NOTE — ED Provider Notes (Signed)
[facility] DEPT Provider Note   CSN: [id number] Arrival date & time: 03/10/21  1011     History  Chief Complaint  Patient presents with   Hypertension    Marilyn [name] is a 66 y.o. female.  66 year old female presents with high blood pressure at home.  States that she was recently started on a new antihypertensive which is a beta-blocker.  She has had some low heart rate although she is unsure of an exact value.  Denies any syncope.  Had some transient paresthesias in her left arm but no focal weakness.  Denies any chest pain or shortness of breath.  Called her doctor and was told to come here      Home Medications Prior to Admission medications   Medication Sig Start Date End Date Taking? Authorizing Provider  albuterol (VENTOLIN HFA) 108 (90 Base) MCG/ACT inhaler Inhale 2 puffs into the lungs every 6 (six) hours as needed for wheezing or shortness of breath. 12/25/19   Plotnikov, Evie Lacks, MD  ALPRAZolam Duanne Moron) 1 MG tablet Take 0.5-1 tablets (0.5-1 mg total) by mouth 3 (three) times daily as needed for anxiety. 02/17/21   Plotnikov, Evie Lacks, MD  budesonide-formoterol (SYMBICORT) 80-4.5 MCG/ACT inhaler TAKE 2 PUFFS BY MOUTH TWICE A DAY 12/27/20   Plotnikov, Evie Lacks, MD  busPIRone (BUSPAR) 30 MG tablet TAKE 1/2 TO 1 (ONE-HALF TO ONE) TABLET BY MOUTH TWICE DAILY 06/07/20   Plotnikov, Evie Lacks, MD  celecoxib (CELEBREX) 200 MG capsule Take 1 capsule (200 mg total) by mouth daily as needed for moderate pain. 08/28/19   Plotnikov, Evie Lacks, MD  Cholecalciferol 5000 UNITS TABS Take 1 tablet by mouth 3 (three) times a week.    [provider]  clobetasol cream (TEMOVATE) 8.31 % Apply 1 application topically 2 (two) times daily. Patient taking differently: Apply 1 application topically 2 (two) times daily as needed. 09/13/17   Plotnikov, Evie Lacks, MD  diclofenac Sodium (VOLTAREN) 1 % GEL Apply 4 g topically 4 (four) times daily. 08/11/19    Lamptey, Myrene Galas, MD  ipratropium (ATROVENT) 0.03 % nasal spray Place 2 sprays into both nostrils 2 (two) times daily as needed for rhinitis. 07/27/20   Scot Jun, FNP  Multiple Vitamin (MULTIVITAMIN WITH MINERALS) TABS tablet Take 1 tablet by mouth daily.    [provider]  nebivolol (BYSTOLIC) 5 MG tablet Take 1 tablet (5 mg total) by mouth daily. 02/02/21   Binnie Rail, MD  pantoprazole (PROTONIX) 40 MG tablet Take 1 tablet (40 mg total) by mouth daily. 04/28/19 08/11/19  Plotnikov, Evie Lacks, MD      Allergies    Lexapro [escitalopram oxalate], Microzide [hydrochlorothiazide], and Penicillins    Review of Systems   Review of Systems  All other systems reviewed and are negative.  Physical Exam Updated Vital Signs BP (!) 159/84 (BP Location: Right Arm)    Pulse 62    Temp 98.4 F (36.9 C) (Oral)    Resp 18    SpO2 100%  Physical Exam Vitals and nursing note reviewed.  Constitutional:      General: She is not in acute distress.    Appearance: Normal appearance. She is well-developed. She is not toxic-appearing.  HENT:     Head: Normocephalic and atraumatic.  Eyes:     General: Lids are normal.     Conjunctiva/sclera: Conjunctivae normal.     Pupils: Pupils are equal, round, and reactive to light.  Neck:  Thyroid: No thyroid mass.     Trachea: No tracheal deviation.  Cardiovascular:     Rate and Rhythm: Normal rate and regular rhythm.     Heart sounds: Normal heart sounds. No murmur heard.   No gallop.  Pulmonary:     Effort: Pulmonary effort is normal. No respiratory distress.     Breath sounds: Normal breath sounds. No stridor. No decreased breath sounds, wheezing, rhonchi or rales.  Abdominal:     General: There is no distension.     Palpations: Abdomen is soft.     Tenderness: There is no abdominal tenderness. There is no rebound.  Musculoskeletal:        General: No tenderness. Normal range of motion.     Cervical back: Normal range of motion  and neck supple.  Skin:    General: Skin is warm and dry.     Findings: No abrasion or rash.  Neurological:     Mental Status: She is alert and oriented to person, place, and time. Mental status is at baseline.     GCS: GCS eye subscore is 4. GCS verbal subscore is 5. GCS motor subscore is 6.     Cranial Nerves: No cranial nerve deficit.     Sensory: No sensory deficit.     Motor: Motor function is intact.  Psychiatric:        Attention and Perception: Attention normal.        Speech: Speech normal.        Behavior: Behavior normal.    ED Results / Procedures / Treatments   Labs (all labs ordered are listed, but only abnormal results are displayed) Labs Reviewed  BASIC METABOLIC PANEL - Abnormal; Notable for the following components:      Result Value   Glucose, Bld 102 (*)    All other components within normal limits  BRAIN NATRIURETIC PEPTIDE - Abnormal; Notable for the following components:   B Natriuretic Peptide 157.7 (*)    All other components within normal limits  CBC WITH DIFFERENTIAL/PLATELET - Abnormal; Notable for the following components:   Hemoglobin 11.7 (*)    MCV 76.5 (*)    MCH 23.7 (*)    RDW 15.9 (*)    All other components within normal limits  URINALYSIS, ROUTINE W REFLEX MICROSCOPIC  TROPONIN I (HIGH SENSITIVITY)  TROPONIN I (HIGH SENSITIVITY)    EKG EKG Interpretation  Date/Time:  Thursday March 10 2021 10:49:57 EST Ventricular Rate:  66 PR Interval:  147 QRS Duration: 69 QT Interval:  400 QTC Calculation: 420 R Axis:   41 Text Interpretation: Sinus rhythm Borderline low voltage, extremity leads Confirmed by Lacretia Leigh ([id number]) on 03/10/2021 6:53:01 PM  Radiology DG Chest 2 View  Result Date: 03/10/2021 CLINICAL DATA:  Shortness of breath EXAM: CHEST - 2 VIEW COMPARISON:  Chest x-ray dated January 09, 2021 FINDINGS: Cardiac and mediastinal contours are within normal limits. Mild bibasilar opacities, likely due to atelectasis. Lungs  otherwise clear. No pleural effusion or pneumothorax. Surgical clips noted over the left breast. IMPRESSION: No active cardiopulmonary disease. Electronically Signed   By: Yetta Glassman M.D.   On: 03/10/2021 11:00    Procedures Procedures    Medications Ordered in ED Medications - No data to display  ED Course/ Medical Decision Making/ A&P                           Medical Decision Making  Patient is EKG  per my interpretation no evidence of ischemic changes.  Old records were reviewed as well 2.  Patient's cardiac enzymes were negative here indicating no evidence of ACS.  Chest x-ray without acute findings.  Pressure monitored here and has been stable.  No evidence of acute kidney injury based on electrolytes.  Plan will be for patient to continue her medications and I will give her referral to a cardiologist specializing in hypertension.        Final Clinical Impression(s) / ED Diagnoses Final diagnoses:  None    Rx / DC Orders ED Discharge Orders     None         Lacretia Leigh, MD 03/10/21 [phone]

## 2021-03-10 NOTE — ED Provider Triage Note (Signed)
Emergency Medicine Provider Triage Evaluation Note  Marilyn [name] , a 66 y.o. female  was evaluated in triage.  Pt complains of high blood pressure. States that she was diagnosed with hypertension and was placed on HCTZ in December. However, medication was causing her to have headaches so she was switched to Nebivolol. She states that she feels like her heart rate has been dropping really low, however her blood pressure is continuing to be elevated. She states that last night her BP was SBP 170, and this morning >200. She is complaining of numbness to the left arm as well as swelling to her left ankle. Complains of SOB as well as brief chest pain. Has not taking HTN medication today.  Review of Systems  Positive: See above  Negative:   Physical Exam  BP (!) 212/91 (BP Location: Right Arm)    Pulse 79    Temp 98.9 F (37.2 C) (Oral)    Resp 18    SpO2 99%  Gen:   Awake, no distress   Resp:  Normal effort, lungs clear   MSK:   Moves extremities without difficulty  Other:  S1/S2 without murmur   Medical Decision Making  Medically screening exam initiated at 10:39 AM.  Appropriate orders placed.  Celesta Gentile Ahles was informed that the remainder of the evaluation will be completed by another provider, this initial triage assessment does not replace that evaluation, and the importance of remaining in the ED until their evaluation is complete.     Mickie Hillier, PA-C 03/10/21 1044

## 2021-03-10 NOTE — Telephone Encounter (Signed)
Patient states she is having a medication reaction to nebivolol (BYSTOLIC) 5 MG tablet  Patient states since taking the medication her resting heart rate is under 60, she has numbness and swelling in her left hand and ankle, elevated bp highest being 174/104, and fatigue x1w  Patient states she has had weight gain in the past month and has not had an increase of calories  Patient's call transferred to team health

## 2021-03-10 NOTE — ED Notes (Signed)
Patient has a urine culture in the main lab 

## 2021-03-14 ENCOUNTER — Other Ambulatory Visit: Payer: Self-pay

## 2021-03-14 ENCOUNTER — Encounter: Payer: Self-pay | Admitting: Cardiology

## 2021-03-14 ENCOUNTER — Ambulatory Visit: Payer: No Typology Code available for payment source | Admitting: Cardiology

## 2021-03-14 VITALS — BP 190/100 | HR 64 | Ht 65.0 in | Wt 214.0 lb

## 2021-03-14 DIAGNOSIS — I2510 Atherosclerotic heart disease of native coronary artery without angina pectoris: Secondary | ICD-10-CM | POA: Diagnosis not present

## 2021-03-14 DIAGNOSIS — I2583 Coronary atherosclerosis due to lipid rich plaque: Secondary | ICD-10-CM

## 2021-03-14 DIAGNOSIS — I10 Essential (primary) hypertension: Secondary | ICD-10-CM | POA: Diagnosis not present

## 2021-03-14 DIAGNOSIS — J449 Chronic obstructive pulmonary disease, unspecified: Secondary | ICD-10-CM | POA: Diagnosis not present

## 2021-03-14 DIAGNOSIS — E785 Hyperlipidemia, unspecified: Secondary | ICD-10-CM | POA: Diagnosis not present

## 2021-03-14 MED ORDER — AMLODIPINE BESYLATE 5 MG PO TABS: 5.0000 mg | ORAL_TABLET | Freq: Every day | ORAL | 3 refills | Status: DC

## 2021-03-14 NOTE — Patient Instructions (Signed)
Medication Instructions:  Your physician has recommended you make the following change in your medication:   START: Amlodipine 5 mg daily  *If you need a refill on your cardiac medications before your next appointment, please call your pharmacy*   Lab Work: None If you have labs (blood work) drawn today and your tests are completely normal, you will receive your results only by: Marilyn [name] (if you have MyChart) OR A paper copy in the mail If you have any lab test that is abnormal or we need to change your treatment, we will call you to review the results.   Testing/Procedures: Your physician has requested that you have an echocardiogram. Echocardiography is a painless test that uses sound waves to create images of your heart. It provides your doctor with information about the size and shape of your heart and how well your hearts chambers and valves are working. This procedure takes approximately one hour. There are no restrictions for this procedure.    Follow-Up: At [facility], you and your health needs are our priority.  As part of our continuing mission to provide you with exceptional heart care, we have created designated Provider Care Teams.  These Care Teams include your primary Cardiologist (physician) and Advanced Practice Providers (APPs -  Physician Assistants and Nurse Practitioners) who all work together to provide you with the care you need, when you need it.  We recommend signing up for the patient portal called "MyChart".  Sign up information is provided on this After Visit Summary.  MyChart is used to connect with patients for Virtual Visits (Telemedicine).  Patients are able to view lab/test results, encounter notes, upcoming appointments, etc.  Non-urgent messages can be sent to your provider as well.   To learn more about what you can do with MyChart, go to [email or web address].    Your next appointment:   6 week(s)  The format for your next appointment:    In Person  Provider:   Jenne Campus, MD    Other Instructions Echocardiogram An echocardiogram is a test that uses sound waves (ultrasound) to produce images of the heart. Images from an echocardiogram can provide important information about: Heart size and shape. The size and thickness and movement of your heart's walls. Heart muscle function and strength. Heart valve function or if you have stenosis. Stenosis is when the heart valves are too narrow. If blood is flowing backward through the heart valves (regurgitation). A tumor or infectious growth around the heart valves. Areas of heart muscle that are not working well because of poor blood flow or injury from a heart attack. Aneurysm detection. An aneurysm is a weak or damaged part of an artery wall. The wall bulges out from the normal force of blood pumping through the body. Tell a health care provider about: Any allergies you have. All medicines you are taking, including vitamins, herbs, eye drops, creams, and over-the-counter medicines. Any blood disorders you have. Any surgeries you have had. Any medical conditions you have. Whether you are pregnant or may be pregnant. What are the risks? Generally, this is a safe test. However, problems may occur, including an allergic reaction to dye (contrast) that may be used during the test. What happens before the test? No specific preparation is needed. You may eat and drink normally. What happens during the test?  You will take off your clothes from the waist up and put on a hospital gown. Electrodes or electrocardiogram (ECG)patches may be placed on your chest. The  electrodes or patches are then connected to a device that monitors your heart rate and rhythm. You will lie down on a table for an ultrasound exam. A gel will be applied to your chest to help sound waves pass through your skin. A handheld device, called a transducer, will be pressed against your chest and moved over  your heart. The transducer produces sound waves that travel to your heart and bounce back (or "echo" back) to the transducer. These sound waves will be captured in real-time and changed into images of your heart that can be viewed on a video monitor. The images will be recorded on a computer and reviewed by your health care provider. You may be asked to change positions or hold your breath for a short time. This makes it easier to get different views or better views of your heart. In some cases, you may receive contrast through an IV in one of your veins. This can improve the quality of the pictures from your heart. The procedure may vary among health care providers and hospitals. What can I expect after the test? You may return to your normal, everyday life, including diet, activities, and medicines, unless your health care provider tells you not to do that. Follow these instructions at home: It is up to you to get the results of your test. Ask your health care provider, or the department that is doing the test, when your results will be ready. Keep all follow-up visits. This is important. Summary An echocardiogram is a test that uses sound waves (ultrasound) to produce images of the heart. Images from an echocardiogram can provide important information about the size and shape of your heart, heart muscle function, heart valve function, and other possible heart problems. You do not need to do anything to prepare before this test. You may eat and drink normally. After the echocardiogram is completed, you may return to your normal, everyday life, unless your health care provider tells you not to do that. This information is not intended to replace advice given to you by your health care provider. Make sure you discuss any questions you have with your health care provider. Document Revised: 10/27/2020 Document Reviewed: 10/07/2019 Elsevier Patient Education  2022 [employer].

## 2021-03-14 NOTE — Addendum Note (Signed)
Addended by: Edwyna Shell I on: 03/14/2021 04:01 PM   Modules accepted: Orders

## 2021-03-14 NOTE — Progress Notes (Signed)
Cardiology Consultation:    Date:  03/14/2021   ID:  Marilyn [name], DOB Sep 05, 1955, MRN [id number]  PCP:  Cassandria Anger, MD  Cardiologist:  Jenne Campus, MD   Referring MD: Cassandria Anger, MD   Chief Complaint  Patient presents with   Hypertension    History of Present Illness:    Marilyn [name] is a 66 y.o. female who is being seen today for the evaluation of essential hypertension at the request of Plotnikov, Evie Lacks, MD. past medical history significant for anxiety, she did have calcium score performed on her coronary arteries which is 50 which is elevated, essential hypertension which is difficult to manage, COPD.  Recently she ended going to the emergency room with very high blood pressure.  After that she was referred to [place].  She denies have any chest pain tightness squeezing pressure burning chest but complain of having some shortness of breath.  She also snores a lot and she is joking that nobody wants to sleep next to her interestingly her husband does have sleep apnea she knows exactly what is involved and she thinks she has it.  She does not exercise on the regular basis and she feels guilty about it.  She gained few pounds within the last few months and again she is understanding is a problem and thinking about doing something to lose weight.  She never had any heart trouble.  She does have family history of premature coronary artery disease, she is not on any special diet.  Past Medical History:  Diagnosis Date   Allergy    Anemia    hx   Anxiety    panic disorder   Breast cancer ([employer]) 06/06/2011   bc  left breast 3 o'clock dx=invasive ductal ca uoqER/PR=positive   Cancer (HCC)    BREAST - left   Cigarette smoker    Colon polyp    Complication of anesthesia    DIFFICULTY AWAKENING, BP INCREASE AFTER CHOLECYSTECTEMY    COPD (chronic obstructive pulmonary disease) (HCC)    Depression    Diverticulosis of colon    DJD (degenerative  joint disease)    DJD (degenerative joint disease)    Family history of breast cancer    Family history of colon cancer    Family history of pancreatic cancer    Family history of prostate cancer    Gallstones    GERD (gastroesophageal reflux disease)    Pt. denies having GERD. Unable to remove it.   Headache(784.0)    History of anemia    History of bronchitis    History of sarcoidosis    Hyperlipidemia    no meds needed   Incisional breast wound    AT AGE 62 BILATERAL INCISION TO BREAST MADE   Leg swelling    Panic disorder    Personal history of radiation therapy    S/P radiation therapy 08/17/11 - 09/28/11   LLQ - 50 Gy/25 Fractions with Boost of 10 Gy / 5 fractions   Sialoadenitis of submandibular gland 2016   Vitamin D deficiency     Past Surgical History:  Procedure Laterality Date   BIOPSY BREAST     left breast  as a teenager  benign   BREAST BIOPSY Left 06/06/2011   BREAST EXCISIONAL BIOPSY Bilateral    BREAST LUMPECTOMY Left 2013   BREAST SURGERY  07/05/11   left breast lumpectomy with needle loc & axillary sln bx   BUNIONECTOMY  2011  bilateral by Dr Little Ishikawa   CHOLECYSTECTOMY  02/2019   COLONOSCOPY     polyp   cyst removed from right hand  [place]  2000   Dr Gae Gallop FINGER RELEASE  2008   Dr Burney Gauze    Current Medications: Current Meds  Medication Sig   albuterol (VENTOLIN HFA) 108 (90 Base) MCG/ACT inhaler Inhale 2 puffs into the lungs every 6 (six) hours as needed for wheezing or shortness of breath.   ALPRAZolam (XANAX) 1 MG tablet Take 0.5-1 tablets (0.5-1 mg total) by mouth 3 (three) times daily as needed for anxiety.   budesonide-formoterol (SYMBICORT) 80-4.5 MCG/ACT inhaler Inhale 2 puffs into the lungs 2 (two) times daily.   busPIRone (BUSPAR) 30 MG tablet Take 15-30 mg by mouth in the morning and at bedtime.   celecoxib (CELEBREX) 200 MG capsule Take 1 capsule (200 mg  total) by mouth daily as needed for moderate pain.   Cholecalciferol 5000 UNITS TABS Take 1 tablet by mouth 3 (three) times a week.   clobetasol cream (TEMOVATE) 6.81 % Apply 1 application topically 2 (two) times daily. (Patient taking differently: Apply 1 application topically 2 (two) times daily as needed (rash).)   diclofenac Sodium (VOLTAREN) 1 % GEL Apply 4 g topically 4 (four) times daily. (Patient taking differently: Apply 4 g topically as needed (Joint pain).)   ipratropium (ATROVENT) 0.03 % nasal spray Place 2 sprays into both nostrils 2 (two) times daily as needed for rhinitis.   Multiple Vitamin (MULTIVITAMIN WITH MINERALS) TABS tablet Take 1 tablet by mouth daily. Unknown strength   nebivolol (BYSTOLIC) 5 MG tablet Take 1 tablet (5 mg total) by mouth daily.     Allergies:   Lexapro [escitalopram oxalate], Microzide [hydrochlorothiazide], and Penicillins   Social History   Socioeconomic History   Marital status: Married    Spouse name: Not on file   Number of children: 2   Years of education: Not on file   Highest education level: Not on file  Occupational History   Occupation: retired  Tobacco Use   Smoking status: Every Day    Packs/day: 0.50    Types: Cigarettes    Last attempt to quit: 04/16/2012    Years since quitting: 8.9   Smokeless tobacco: Never   Tobacco comments:    STOPPED AND RESTARTED  Vaping Use   Vaping Use: Never used  Substance and Sexual Activity   Alcohol use: Yes    Alcohol/week: 0.0 - 1.0 standard drinks    Comment: social   Drug use: No   Sexual activity: Not Currently    Birth control/protection: Surgical    Comment: menses age 72,hrt started  2000  Other Topics Concern   Not on file  Social History Narrative   Widowed - husband died 05-14-03 step-children   Exercises 3x per week   2 cups of caffeine daily         Social Determinants of Health   Financial Resource Strain: Not on file  Food Insecurity: Not on file  Transportation  Needs: Not on file  Physical Activity: Not on file  Stress: Not on file  Social Connections: Not on file     Family History: The patient's family history includes Anemia in her mother; Bladder Cancer in her father; Brain cancer in an other family member; Breast cancer in her cousin, cousin, and sister; Colon cancer (  age of onset: 54) in her father; Colon polyps in her father; Dementia in her maternal aunt and maternal aunt; Hyperlipidemia in her brother; Hypertension in her brother; Lung cancer in her maternal uncle, paternal aunt, and sister; Multiple sclerosis in her brother; Other in her mother; Pancreatic cancer (age of onset: 35) in her sister; Prostate cancer in her brother and father; Sarcoidosis in her cousin and sister; Seizures in her maternal uncle and mother. There is no history of Esophageal cancer, Stomach cancer, or Rectal cancer. ROS:   Please see the history of present illness.    All 14 point review of systems negative except as described per history of present illness.  EKGs/Labs/Other Studies Reviewed:    The following studies were reviewed today:   EKG:  EKG is  ordered today.  The ekg ordered today demonstrates EKG today showed normal sinus rhythm normal P interval possible left atrium enlargement, no evidence of LVH, no ST segment changes  Recent Labs: 12/28/2020: ALT 11; TSH 1.25 03/10/2021: B Natriuretic Peptide 157.7; BUN 10; Creatinine, Ser 0.70; Hemoglobin 11.7; Platelets 244; Potassium 3.7; Sodium 138  Recent Lipid Panel    Component Value Date/Time   CHOL 209 (H) 12/28/2020 1138   TRIG 70.0 12/28/2020 1138   HDL 69.20 12/28/2020 1138   CHOLHDL 3 12/28/2020 1138   VLDL 14.0 12/28/2020 1138   LDLCALC 126 (H) 12/28/2020 1138   LDLDIRECT 118.9 12/18/2011 1138    Physical Exam:    VS:  BP (!) 190/100 (BP Location: Left Arm, Patient Position: Sitting)    Pulse 64    Ht 5\' 5"  (1.651 m)    Wt 214 lb (97.1 kg)    SpO2 98%    BMI 35.61 kg/m     Wt Readings  from Last 3 Encounters:  03/14/21 214 lb (97.1 kg)  02/17/21 210 lb 6.4 oz (95.4 kg)  02/02/21 206 lb (93.4 kg)     GEN:  Well nourished, well developed in no acute distress HEENT: Normal NECK: No JVD; No carotid bruits LYMPHATICS: No lymphadenopathy CARDIAC: RRR, no murmurs, no rubs, no gallops RESPIRATORY:  Clear to auscultation without rales, wheezing or rhonchi  ABDOMEN: Soft, non-tender, non-distended MUSCULOSKELETAL:  No edema; No deformity  SKIN: Warm and dry NEUROLOGIC:  Alert and oriented x 3 PSYCHIATRIC:  Normal affect   ASSESSMENT:    1. Coronary atherosclerosis due to lipid rich plaque   2. Primary hypertension   3. Dyslipidemia   4. COPD mixed type ([place])    PLAN:    In order of problems listed above:  Essential hypertension which seems to be difficult to control.  She did try some diuretic which gave her some palpitations now she is on Biostolic, I suspect in the future we will probably add angiotensin receptor blocker.  She will be scheduled to have an echocardiogram to assess left ventricle ejection fraction look for left ventricle hypertrophy, her EKG did not show evidence of LVH.  Another suspect for the cause of her high blood pressure is obstructive sleep apnea.  She described the fact that she snores a lot and I think it would be good idea to perform sleep study on her. Dyslipidemia, she clearly have dyslipidemia with her LDL of 126 HDL 69 she clearly can benefit from cholesterol medication however I do not want to start it today until I will get her blood pressure better under control simply I do not want to start to medication at the same time because if she  still having problem will be difficult to assess as which medication caused the trouble.  Target LDL should be easily below 100 may be less than that. COPD stable she does not smoke anymore. We did spend a great of time talking about nonpharmacological way to reduce blood pressure which is avoidance of salty  food need to exercise on the regular basis as well as drop few pounds.  She understood that and she is willing to do that.  Also ask her to check blood pressure on the regular basis.   Medication Adjustments/Labs and Tests Ordered: Current medicines are reviewed at length with the patient today.  Concerns regarding medicines are outlined above.  No orders of the defined types were placed in this encounter.  No orders of the defined types were placed in this encounter.   Signed, Park Liter, MD, [facility]. 03/14/2021 3:45 PM    [facility]

## 2021-03-14 NOTE — Progress Notes (Signed)
f °

## 2021-03-15 ENCOUNTER — Telehealth: Payer: Self-pay | Admitting: Internal Medicine

## 2021-03-15 NOTE — Telephone Encounter (Signed)
Connected to [facility] 1.12.2023.  Caller states that she has a blood pressure of 174/104, decreased heartrate in the 60's, fatigue, and swelling and numbness in left hand and ankle.Today it is running 217/114 and heart rate is 78, states it has been decreased heart rate.  Advised to go to ED now.

## 2021-03-16 NOTE — Telephone Encounter (Signed)
Noted. Agree. Thanks.

## 2021-03-30 ENCOUNTER — Ambulatory Visit (HOSPITAL_BASED_OUTPATIENT_CLINIC_OR_DEPARTMENT_OTHER)
Admission: RE | Admit: 2021-03-30 | Discharge: 2021-03-30 | Disposition: A | Discharge status: Home / Self Care | Payer: No Typology Code available for payment source | Source: Ambulatory Visit | Attending: Cardiology | Admitting: Cardiology

## 2021-03-30 ENCOUNTER — Other Ambulatory Visit: Payer: Self-pay

## 2021-03-30 DIAGNOSIS — E785 Hyperlipidemia, unspecified: Secondary | ICD-10-CM | POA: Insufficient documentation

## 2021-03-30 DIAGNOSIS — J449 Chronic obstructive pulmonary disease, unspecified: Secondary | ICD-10-CM | POA: Insufficient documentation

## 2021-03-30 DIAGNOSIS — I10 Essential (primary) hypertension: Secondary | ICD-10-CM | POA: Insufficient documentation

## 2021-03-30 DIAGNOSIS — R0609 Other forms of dyspnea: Secondary | ICD-10-CM

## 2021-03-30 DIAGNOSIS — I2583 Coronary atherosclerosis due to lipid rich plaque: Secondary | ICD-10-CM | POA: Diagnosis present

## 2021-03-30 DIAGNOSIS — I2510 Atherosclerotic heart disease of native coronary artery without angina pectoris: Secondary | ICD-10-CM | POA: Diagnosis present

## 2021-03-30 LAB — ECHOCARDIOGRAM COMPLETE
AR max vel: 2.72 cm2
AV Area VTI: 2.67 cm2
AV Area mean vel: 2.44 cm2
AV Mean grad: 5 mmHg
AV Peak grad: 8.8 mmHg
Ao pk vel: 1.48 m/s
Area-P 1/2: 4.01 cm2
S' Lateral: 2.9 cm

## 2021-03-30 NOTE — Progress Notes (Signed)
°  Echocardiogram 2D Echocardiogram has been performed.  Marilyn [name] F 03/30/2021, 10:11 AM

## 2021-04-01 ENCOUNTER — Telehealth: Payer: Self-pay

## 2021-04-01 NOTE — Telephone Encounter (Signed)
Patient notified of results.

## 2021-04-01 NOTE — Telephone Encounter (Signed)
-----   Message from Park Liter, MD sent at 04/01/2021 12:14 PM EST ----- Echocardiogram showed normal left ventricle ejection fraction, overall looks good

## 2021-04-18 DIAGNOSIS — T8859XA Other complications of anesthesia, initial encounter: Secondary | ICD-10-CM | POA: Insufficient documentation

## 2021-04-18 DIAGNOSIS — Z8042 Family history of malignant neoplasm of prostate: Secondary | ICD-10-CM | POA: Insufficient documentation

## 2021-04-18 DIAGNOSIS — K635 Polyp of colon: Secondary | ICD-10-CM | POA: Insufficient documentation

## 2021-04-18 DIAGNOSIS — Z8709 Personal history of other diseases of the respiratory system: Secondary | ICD-10-CM | POA: Insufficient documentation

## 2021-04-18 DIAGNOSIS — F32A Depression, unspecified: Secondary | ICD-10-CM | POA: Insufficient documentation

## 2021-04-18 DIAGNOSIS — S21009A Unspecified open wound of unspecified breast, initial encounter: Secondary | ICD-10-CM | POA: Insufficient documentation

## 2021-04-18 DIAGNOSIS — M7989 Other specified soft tissue disorders: Secondary | ICD-10-CM | POA: Insufficient documentation

## 2021-04-18 DIAGNOSIS — K219 Gastro-esophageal reflux disease without esophagitis: Secondary | ICD-10-CM | POA: Insufficient documentation

## 2021-04-18 DIAGNOSIS — C801 Malignant (primary) neoplasm, unspecified: Secondary | ICD-10-CM | POA: Insufficient documentation

## 2021-04-18 DIAGNOSIS — Z862 Personal history of diseases of the blood and blood-forming organs and certain disorders involving the immune mechanism: Secondary | ICD-10-CM | POA: Insufficient documentation

## 2021-04-18 DIAGNOSIS — F17210 Nicotine dependence, cigarettes, uncomplicated: Secondary | ICD-10-CM | POA: Insufficient documentation

## 2021-04-21 ENCOUNTER — Other Ambulatory Visit: Payer: Self-pay

## 2021-04-21 ENCOUNTER — Encounter: Payer: Self-pay | Admitting: Cardiology

## 2021-04-21 ENCOUNTER — Ambulatory Visit (INDEPENDENT_AMBULATORY_CARE_PROVIDER_SITE_OTHER): Payer: No Typology Code available for payment source | Admitting: Cardiology

## 2021-04-21 VITALS — BP 136/84 | HR 70 | Ht 65.0 in | Wt 213.0 lb

## 2021-04-21 DIAGNOSIS — E785 Hyperlipidemia, unspecified: Secondary | ICD-10-CM

## 2021-04-21 DIAGNOSIS — I2583 Coronary atherosclerosis due to lipid rich plaque: Secondary | ICD-10-CM | POA: Diagnosis not present

## 2021-04-21 DIAGNOSIS — I10 Essential (primary) hypertension: Secondary | ICD-10-CM | POA: Diagnosis not present

## 2021-04-21 DIAGNOSIS — R0683 Snoring: Secondary | ICD-10-CM

## 2021-04-21 DIAGNOSIS — I2510 Atherosclerotic heart disease of native coronary artery without angina pectoris: Secondary | ICD-10-CM | POA: Diagnosis not present

## 2021-04-21 MED ORDER — AMLODIPINE BESYLATE 10 MG PO TABS: 10.0000 mg | ORAL_TABLET | Freq: Every day | ORAL | 3 refills | Status: DC

## 2021-04-21 NOTE — Progress Notes (Signed)
Cardiology Office Note:    Date:  04/21/2021   ID:  Marilyn [name], DOB [birth year], MRN [id number]  PCP:  Cassandria Anger, MD  Cardiologist:  Jenne Campus, MD    Referring MD: Cassandria Anger, MD   No chief complaint on file. I am doing fine  History of Present Illness:    Marilyn [name] is a 66 y.o. female with past medical history significant for essential hypertension, dyslipidemia, history of sarcoidosis she was referred to [place] because CT of her chest demonstrated the positive calcium in her coronary arteries only mild.  She denies have any symptomatology.  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 denies having GERD. Unable to remove it.   Headache(784.0)    History of anemia    History of bronchitis    History of sarcoidosis    Hyperlipidemia    no meds needed   Incisional breast wound    AT AGE 30 BILATERAL INCISION TO BREAST MADE   Leg swelling    Panic disorder    Personal history of radiation therapy    S/P radiation therapy 08/17/11 - 09/28/11   LLQ - 50 Gy/25 Fractions with Boost of 10 Gy / 5 fractions   Sialoadenitis of submandibular gland 2016   Vitamin D deficiency     Past Surgical  History:  Procedure Laterality Date   BIOPSY BREAST     left breast  as a teenager  benign   BREAST BIOPSY Left 06/06/2011   BREAST EXCISIONAL BIOPSY Bilateral    BREAST LUMPECTOMY Left 2013   BREAST SURGERY  07/05/11   left breast lumpectomy with needle loc & axillary sln bx   BUNIONECTOMY  2011   bilateral by Dr Little Ishikawa   CHOLECYSTECTOMY  02/2019   COLONOSCOPY     polyp   cyst removed from right hand  Marilyn [name]  2000   Dr Gae Gallop FINGER RELEASE  2008   Dr Burney Gauze    Current Medications: Current Meds  Medication Sig   albuterol (VENTOLIN HFA) 108 (90 Base) MCG/ACT inhaler Inhale 2 puffs into the lungs every 6 (six) hours as needed for wheezing or shortness of breath.   ALPRAZolam (XANAX) 1 MG tablet Take 0.5-1 tablets (0.5-1 mg total) by mouth 3 (three) times daily as needed for anxiety.   amLODipine (NORVASC) 10 MG tablet Take 1 tablet (10 mg total) by mouth daily.   budesonide-formoterol (SYMBICORT) 80-4.5 MCG/ACT inhaler Inhale 2 puffs  into the lungs 2 (two) times daily.   busPIRone (BUSPAR) 30 MG tablet Take 15-30 mg by mouth in the morning and at bedtime.   celecoxib (CELEBREX) 200 MG capsule Take 1 capsule (200 mg total) by mouth daily as needed for moderate pain.   Cholecalciferol 5000 UNITS TABS Take 1 tablet by mouth 3 (three) times a week.   clobetasol cream (TEMOVATE) 3.61 % Apply 1 application topically 2 (two) times daily. (Patient taking differently: Apply 1 application topically 2 (two) times daily as needed (rash).)   diclofenac Sodium (VOLTAREN) 1 % GEL Apply 4 g topically 4 (four) times daily. (Patient taking differently: Apply 4 g topically as needed (Joint pain).)   ipratropium (ATROVENT) 0.03 % nasal spray Place 2 sprays into both nostrils 2 (two) times daily as needed for rhinitis.   Multiple Vitamin (MULTIVITAMIN WITH MINERALS) TABS tablet Take 1 tablet by mouth daily. Unknown strength    [DISCONTINUED] amLODipine (NORVASC) 5 MG tablet Take 1 tablet (5 mg total) by mouth daily.     Allergies:   Lexapro [escitalopram oxalate], Microzide [hydrochlorothiazide], and Penicillins   Social History   Socioeconomic History   Marital status: Married    Spouse name: Not on file   Number of children: 2   Years of education: Not on file   Highest education level: Not on file  Occupational History   Occupation: retired  Tobacco Use   Smoking status: Every Day    Packs/day: 0.50    Types: Cigarettes    Last attempt to quit: 04/16/2012    Years since quitting: 9.0   Smokeless tobacco: Never   Tobacco comments:    STOPPED AND RESTARTED  Vaping Use   Vaping Use: Never used  Substance and Sexual Activity   Alcohol use: Yes    Alcohol/week: 0.0 - 1.0 standard drinks    Comment: social   Drug use: No   Sexual activity: Not Currently    Birth control/protection: Surgical    Comment: menses age 57,hrt started  2000  Other Topics Concern   Not on file  Social History Narrative   Widowed - husband died 2003/05/12 step-children   Exercises 3x per week   2 cups of caffeine daily         Social Determinants of Health   Financial Resource Strain: Not on file  Food Insecurity: Not on file  Transportation Needs: Not on file  Physical Activity: Not on file  Stress: Not on file  Social Connections: Not on file     Family History: The patient's family history includes Anemia in her mother; Bladder Cancer in her father; Brain cancer in an other family member; Breast cancer in her cousin, cousin, and sister; Colon cancer (age of onset: 66) in her father; Colon polyps in her father; Dementia in her maternal aunt and maternal aunt; Hyperlipidemia in her brother; Hypertension in her brother; Lung cancer in her maternal uncle, paternal aunt, and sister; Multiple sclerosis in her brother; Other in her mother; Pancreatic cancer (age of onset: 40) in her sister; Prostate cancer in her  brother and father; Sarcoidosis in her cousin and sister; Seizures in her maternal uncle and mother. There is no history of Esophageal cancer, Stomach cancer, or Rectal cancer. ROS:   Please see the history of present illness.    All 14 point review of systems negative except as described per history of present illness  EKGs/Labs/Other Studies Reviewed:      Recent Labs: 12/28/2020:  ALT 11; TSH 1.25 03/10/2021: B Natriuretic Peptide 157.7; BUN 10; Creatinine, Ser 0.70; Hemoglobin 11.7; Platelets 244; Potassium 3.7; Sodium 138  Recent Lipid Panel    Component Value Date/Time   CHOL 209 (H) 12/28/2020 1138   TRIG 70.0 12/28/2020 1138   HDL 69.20 12/28/2020 1138   CHOLHDL 3 12/28/2020 1138   VLDL 14.0 12/28/2020 1138   LDLCALC 126 (H) 12/28/2020 1138   LDLDIRECT 118.9 12/18/2011 1138    Physical Exam:    VS:  BP 136/84 (BP Location: Right Arm)    Pulse 70    Ht 5\' 5"  (1.651 m)    Wt 213 lb (96.6 kg)    SpO2 98%    BMI 35.45 kg/m     Wt Readings from Last 3 Encounters:  04/21/21 213 lb (96.6 kg)  03/14/21 214 lb (97.1 kg)  02/17/21 210 lb 6.4 oz (95.4 kg)     GEN:  Well nourished, well developed in no acute distress HEENT: Normal NECK: No JVD; No carotid bruits LYMPHATICS: No lymphadenopathy CARDIAC: RRR, no murmurs, no rubs, no gallops RESPIRATORY:  Clear to auscultation without rales, wheezing or rhonchi  ABDOMEN: Soft, non-tender, non-distended MUSCULOSKELETAL:  No edema; No deformity  SKIN: Warm and dry LOWER EXTREMITIES: no swelling NEUROLOGIC:  Alert and oriented x 3 PSYCHIATRIC:  Normal affect   ASSESSMENT:    1. Primary hypertension   2. Coronary atherosclerosis due to lipid rich plaque   3. Dyslipidemia    PLAN:    In order of problems listed above:  Primary hypertension.  She is doing better control.  But still should be better.  I will ask her to increase dose of amlodipine to 10 mg daily. Suspicion for obstructive sleep apnea.  We will schedule her to  have sleep study done. Dyslipidemia: With Dr. Helane Rima potentially starting statin.  She does have a medic because of coronary artery calcifications.  Clinically beneficial.   Medication Adjustments/Labs and Tests Ordered: Current medicines are reviewed at length with the patient today.  Concerns regarding medicines are outlined above.  Orders Placed This Encounter  Procedures   Ambulatory referral to Sleep Studies   Medication changes:  Meds ordered this encounter  Medications   amLODipine (NORVASC) 10 MG tablet    Sig: Take 1 tablet (10 mg total) by mouth daily.    Dispense:  180 tablet    Refill:  3    Signed, Park Liter, MD, [facility] 04/21/2021 12:40 PM     [facility]

## 2021-04-21 NOTE — Patient Instructions (Addendum)
Medication Instructions:  Your physician has recommended you make the following change in your medication:  Increase Amlodipine to 10 mg once daily  *If you need a refill on your cardiac medications before your next appointment, please call your pharmacy*   Lab Work: None  If you have labs (blood work) drawn today and your tests are completely normal, you will receive your results only by: [place] (if you have MyChart) OR A paper copy in the mail If you have any lab test that is abnormal or we need to change your treatment, we will call you to review the results.   Testing/Procedures: None   Follow-Up: At [facility], you and your health needs are our priority.  As part of our continuing mission to provide you with exceptional heart care, we have created designated Provider Care Teams.  These Care Teams include your primary Cardiologist (physician) and Advanced Practice Providers (APPs -  Physician Assistants and Nurse Practitioners) who all work together to provide you with the care you need, when you need it.  We recommend signing up for the patient portal called "MyChart".  Sign up information is provided on this After Visit Summary.  MyChart is used to connect with patients for Virtual Visits (Telemedicine).  Patients are able to view lab/test results, encounter notes, upcoming appointments, etc.  Non-urgent messages can be sent to your provider as well.   To learn more about what you can do with MyChart, go to [email or web address].    Your next appointment:   5 month(s)  The format for your next appointment:   In Person  Provider:   Jenne Campus, MD    Other Instructions Referral for sleep study

## 2021-04-25 ENCOUNTER — Ambulatory Visit: Payer: No Typology Code available for payment source | Admitting: Internal Medicine

## 2021-05-18 ENCOUNTER — Ambulatory Visit (INDEPENDENT_AMBULATORY_CARE_PROVIDER_SITE_OTHER): Payer: No Typology Code available for payment source | Admitting: Cardiovascular Disease

## 2021-05-18 ENCOUNTER — Encounter (HOSPITAL_BASED_OUTPATIENT_CLINIC_OR_DEPARTMENT_OTHER): Payer: Self-pay | Admitting: Cardiovascular Disease

## 2021-05-18 ENCOUNTER — Other Ambulatory Visit: Payer: Self-pay

## 2021-05-18 DIAGNOSIS — I2510 Atherosclerotic heart disease of native coronary artery without angina pectoris: Secondary | ICD-10-CM | POA: Diagnosis not present

## 2021-05-18 DIAGNOSIS — I10 Essential (primary) hypertension: Secondary | ICD-10-CM | POA: Diagnosis not present

## 2021-05-18 DIAGNOSIS — I2583 Coronary atherosclerosis due to lipid rich plaque: Secondary | ICD-10-CM | POA: Diagnosis not present

## 2021-05-18 NOTE — Assessment & Plan Note (Signed)
Working on diet and exercise as above.  

## 2021-05-18 NOTE — Patient Instructions (Signed)
Medication Instructions:  ?Your physician recommends that you continue on your current medications as directed. Please refer to the Current Medication list given to you today.  ? ?Labwork: ?NONE  ? ?Testing/Procedures: ?NONE ? ?Follow-Up: ?AS NEEDED  ? ?Any Other Special Instructions Will Be Listed Below (If Applicable). ? ?Exercise recommendations: ?The American Heart Association recommends 150 minutes of moderate intensity exercise weekly. ?Try 30 minutes of moderate intensity exercise 4-5 times per week. ?This could include walking, jogging, or swimming. ? ?WORK HARDER ON DIET  ?

## 2021-05-18 NOTE — Assessment & Plan Note (Signed)
Calcium score 79.  This was 82nd percentile for age and gender.  We reviewed her cardiac CT images in clinic today and she understands what this means.  I also recommend that she start a statin.  She wants to start working on diet and exercise.  She sees Dr. Alain Marion in about 3 months and will have repeat labs done at that time.  If her LDL is greater than 70 at that time she will consider a statin. ?

## 2021-05-18 NOTE — Assessment & Plan Note (Signed)
Blood pressure is much better controlled since she was started on amlodipine.  Her blood pressures are pretty consistently in the low 130s over low 80s.  She is under a lot of stress and is not getting any exercise.  She also is eating out a lot and therefore consuming a high sodium diet.  We discussed the importance of limiting her Celebrex and trying to use Tylenol first.  She is also going to start working on meal prep and exercising more.  For now, continue amlodipine.  She does have some mild swelling in her ankles but it is not bothering her.  She plans to use compression socks and does not want to try an alternative agent. ?

## 2021-05-18 NOTE — Progress Notes (Signed)
? ?Advanced Hypertension Clinic Initial Assessment:   ? ?Date:  06/14/2021  ? ?ID:  Marilyn [name], DOB 30-Dec-1955, MRN [id number] ? ?PCP:  Plotnikov, Evie Lacks, MD  ?Cardiologist:  None  ?Nephrologist: ? ?Referring MD: Lacretia Leigh, MD  ? ?CC: Hypertension ? ?History of Present Illness:   ? ?Marilyn [name] is a 66 y.o. female with a hx of coronary calcification, hypertension, hyperlipidemia, left breast cancer s/p radiation therapy, COPD, sarcoidosis, and anemia, here to establish care in the Advanced Hypertension Clinic. She was seen in the ED 03/10/2021 with concern for hypertension. She had recently been started on a new antihypertensive. Cardiac enzymes were negative. She was referred to the Advanced Hypertension Clinic. She saw Dr. Agustin Cree on 03/2021 for coronary calcification. He recommended that she start a statin and amlodipine was increased due to poor blood pressure control. He referred her for a sleep study. She first saw him 02/2021 and her blood pressure was 190/100. She had previously been on a diuretic but developed palpitations and was switched to bisoprolol. She had an Echo 03/2021 with LVEF 60-65% and grade 1 diastolic dysfunction. There was mild LVH.  ? ?Today, she reports noticing her blood pressure has been "up and down" for a while but only recently formally diagnosed. On 01/14/2021 she was started on 12.5 mg HCTZ, which was the first time she was prescribed an antihypertensive. This was switched to bystolic, which was later switched to amlodipine. Now she is taking 10 mg amlodipine. She presents a BP log, with average readings in the 120s-130s/80s. 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 They have home health aides and other family members to help, so she is not concerned about 24 hour care. When she does exercise, she is also limited by knee  pain. After her third trip up and down the stairs at home she will be short of breath. She also notes not using her inhaler as often as she should. About once a week she needs to take Celebrex for management of her left hip pain. She takes this as needed when the pain is severe or persistent. Additionally, she complains of pain and swelling in her bilateral ankles. Usually the swelling is mild, and worse when she stays on her feet for long periods. Mostly they order out for their meals. Lately she does not drink much coffee, maybe once a month. She drinks soda daily, one 16 oz. Alcohol consumption is rare at social gatherings, once a month at most. They are planning to try a meal prep plan or a diet like [employer] as her husband also needs to change his diet for diabetes. In 3 months she will follow up with her PCP and discuss recommendations regarding her cholesterol. She denies any palpitations, or chest pain. No lightheadedness, headaches, syncope, orthopnea, or PND. ? ?Previous antihypertensives: ?HCTZ ?Nebivolol ? ? ?Past Medical History:  ?Diagnosis Date  ? Allergy   ? Anemia   ? hx  ? Anxiety   ? panic disorder  ? Breast cancer ([place]) 06/06/2011  ? bc  left breast 3 o'clock dx=invasive ductal ca uoqER/PR=positive  ? Cancer [facility])   ? BREAST - left  ? Cigarette smoker   ? Colon polyp   ? Complication of anesthesia   ? DIFFICULTY AWAKENING, BP INCREASE AFTER CHOLECYSTECTEMY   ? COPD (chronic obstructive pulmonary disease) (  [place])   ? Depression   ? Diverticulosis of colon   ? DJD (degenerative joint disease)   ? DJD (degenerative joint disease)   ? Family history of breast cancer   ? Family history of colon cancer   ? Family history of pancreatic cancer   ? Family history of prostate cancer   ? Gallstones   ? GERD (gastroesophageal reflux disease)   ? Pt. denies having GERD. Unable to remove it.  ? Headache(784.0)   ? History of anemia   ? History of bronchitis   ? History of sarcoidosis   ? Hyperlipidemia   ? no  meds needed  ? Incisional breast wound   ? AT AGE 56 BILATERAL INCISION TO BREAST MADE  ? Leg swelling   ? Panic disorder   ? Personal history of radiation therapy   ? S/P radiation therapy 08/17/11 - 09/28/11  ? LLQ - 50 Gy/25 Fractions with Boost of 10 Gy / 5 fractions  ? Sialoadenitis of submandibular gland 2016  ? Vitamin D deficiency   ? ? ?Past Surgical History:  ?Procedure Laterality Date  ? BIOPSY BREAST    ? left breast  as a teenager  benign  ? BREAST BIOPSY Left 06/06/2011  ? BREAST EXCISIONAL BIOPSY Bilateral   ? BREAST LUMPECTOMY Left 2013  ? BREAST SURGERY  07/05/11  ? left breast lumpectomy with needle loc & axillary sln bx  ? BUNIONECTOMY  2011  ? bilateral by Dr Little Ishikawa  ? CHOLECYSTECTOMY  02/2019  ? COLONOSCOPY    ? polyp  ? cyst removed from right hand  1995  ? POLYPECTOMY    ? TONSILLECTOMY  1972  ? TOTAL ABDOMINAL HYSTERECTOMY  2000  ? Dr Nori Riis  ? [place]  2008  ? Dr Burney Gauze  ? ? ?Current Medications: ?Current Meds  ?Medication Sig  ? albuterol (VENTOLIN HFA) 108 (90 Base) MCG/ACT inhaler Inhale 2 puffs into the lungs every 6 (six) hours as needed for wheezing or shortness of breath.  ? ALPRAZolam (XANAX) 1 MG tablet Take 0.5-1 tablets (0.5-1 mg total) by mouth 3 (three) times daily as needed for anxiety.  ? amLODipine (NORVASC) 10 MG tablet Take 1 tablet (10 mg total) by mouth daily.  ? budesonide-formoterol (SYMBICORT) 80-4.5 MCG/ACT inhaler Inhale 2 puffs into the lungs 2 (two) times daily.  ? busPIRone (BUSPAR) 30 MG tablet Take 15-30 mg by mouth in the morning and at bedtime.  ? celecoxib (CELEBREX) 200 MG capsule Take 1 capsule (200 mg total) by mouth daily as needed for moderate pain.  ? Cholecalciferol 5000 UNITS TABS Take 1 tablet by mouth 3 (three) times a week.  ? clobetasol cream (TEMOVATE) 1.91 % Apply 1 application topically 2 (two) times daily. (Patient taking differently: Apply 1 application. topically 2 (two) times daily as needed (rash).)  ? diclofenac Sodium  (VOLTAREN) 1 % GEL Apply 4 g topically 4 (four) times daily. (Patient taking differently: Apply 4 g topically as needed (Joint pain).)  ? ipratropium (ATROVENT) 0.03 % nasal spray Place 2 sprays into both nostrils 2 (two) times daily as needed for rhinitis.  ? Multiple Vitamin (MULTIVITAMIN WITH MINERALS) TABS tablet Take 1 tablet by mouth daily. Unknown strength  ?  ? ?Allergies:   Lexapro [escitalopram oxalate], Microzide [hydrochlorothiazide], and Penicillins  ? ?Social History  ? ?Socioeconomic History  ? Marital status: Married  ?  Spouse name: Not on file  ? Number of children: 2  ? Years of education:  Not on file  ? Highest education level: Not on file  ?Occupational History  ? Occupation: retired  ?Tobacco Use  ? Smoking status: Former  ?  Packs/day: 0.50  ?  Types: Cigarettes  ?  Quit date: 12/28/2020  ?  Years since quitting: 0.4  ? Smokeless tobacco: Never  ? Tobacco comments:  ?  STOPPED AND RESTARTED  ?Vaping Use  ? Vaping Use: Never used  ?Substance and Sexual Activity  ? Alcohol use: Yes  ?  Alcohol/week: 0.0 - 1.0 standard drinks  ?  Comment: social  ? Drug use: No  ? Sexual activity: Not Currently  ?  Birth control/protection: Surgical  ?  Comment: menses age 30,hrt started  2000  ?Other Topics Concern  ? Not on file  ?Social History Narrative  ? Widowed - husband died 2005  ? 2 step-children  ? Exercises 3x per week  ? 2 cups of caffeine daily  ?   ?   ? ?Social Determinants of Health  ? ?Financial Resource Strain: Medium Risk  ? Difficulty of Paying Living Expenses: Somewhat hard  ?Food Insecurity: No Food Insecurity  ? Worried About Charity fundraiser in the Last Year: Never true  ? Ran Out of Food in the Last Year: Never true  ?Transportation Needs: No Transportation Needs  ? Lack of Transportation (Medical): No  ? Lack of Transportation (Non-Medical): No  ?Physical Activity: Inactive  ? Days of Exercise per Week: 0 days  ? Minutes of Exercise per Session: 0 min  ?Stress: Not on file  ?Social  Connections: Not on file  ?  ? ?Family History: ?The patient's family history includes Anemia in her mother; Bladder Cancer in her father; Brain cancer in an other family member; Breast cancer in her cousin, cousin

## 2021-06-14 ENCOUNTER — Encounter (HOSPITAL_BASED_OUTPATIENT_CLINIC_OR_DEPARTMENT_OTHER): Payer: Self-pay | Admitting: Cardiovascular Disease

## 2021-06-18 ENCOUNTER — Other Ambulatory Visit: Payer: Self-pay | Admitting: Internal Medicine

## 2021-06-21 ENCOUNTER — Telehealth: Payer: Self-pay

## 2021-06-21 NOTE — Telephone Encounter (Signed)
Advised pt that she does not need a PA for the sleep study. Pt will let me know if she will be able to come by the office today to pickup the device prior to activating the device. ? ?Sleep Apnea Evaluation ? ?[place] ? ?Today's Date: 06/21/2021  ? ?Patient Name: Marilyn [name] [facility]        ?DOB: 1955/04/08  ?     ?Height:  5 ft 5      ?Weight:   211lb 3.2 oz ?BMI:  ?  ?Referring Provider:  Agustin Cree ?  ?STOP-BANG RISK ASSESSMENT    ?   ? ? ?If STOP-BANG Score ?3 OR two clinical symptoms - patient qualifies for WatchPAT (CPT 95800)     ? ?Sleep study ordered due to two (2) of the following clinical symptoms/diagnoses:  ?Excessive daytime sleepiness G47.10  Gastroesophageal reflux K21.9  Nocturia R35.1  Morning Headaches G44.221  Difficulty concentrating R41.840  Memory problems or poor judgment G31.84  Personality changes or irritability R45.4  Loud snoring R06.83  Depression F32.9  Unrefreshed by sleep G47.8  Impotence N52.9  History of high blood pressure R03.0  Insomnia G47.00  Sleep Disordered Breathing or Sleep Apnea ICD G47.33   ? ? ?

## 2021-06-22 ENCOUNTER — Encounter (INDEPENDENT_AMBULATORY_CARE_PROVIDER_SITE_OTHER): Payer: No Typology Code available for payment source | Admitting: Cardiology

## 2021-06-22 DIAGNOSIS — G4733 Obstructive sleep apnea (adult) (pediatric): Secondary | ICD-10-CM

## 2021-06-23 NOTE — Procedures (Signed)
? ?  SLEEP STUDY REPORT ?Patient Information ?Study Date: 06/22/21 ?Patient Name: Marilyn [name] ?Patient ID: [id number] ?Birth Date: 12-Feb-2056 ?Age: 66 ?Gender: Female ?BMI: 35.3 (W=211 lb, H=5' 5'') ?Referring Physician: Jenne Campus, MD ? ?TEST DESCRIPTION: Home sleep apnea testing was completed using the WatchPat, a Type 1 device, utilizing ?peripheral arterial tonometry (PAT), chest movement, actigraphy, pulse oximetry, pulse rate, body position and snore. ?AHI was calculated with apnea and hypopnea using valid sleep time as the denominator. RDI includes apneas, ?hypopneas, and RERAs. The data acquired and the scoring of sleep and all associated events were performed in ?accordance with the recommended standards and specifications as outlined in the AASM Manual for the Scoring of ?Sleep and Associated Events 2.2.0 (2015). ? ?FINDINGS: ?1. Moderate Obstructive Sleep Apnea with AHI 17.4/hr. ?2. No Central Sleep Apnea with pAHIc 0.5/hr. ?3. Oxygen desaturations as low as 83%. ?4. Moderate snoring was present. O2 sats were < 88% for 1 min. ?5. Total sleep time was 4 hrs and 12 min. ?6. 19% of total sleep time was spent in REM sleep. ?7. Normal sleep onset latency at 17 min ?8. Normal REM sleep onset latency at 83 min. ?9. Total awakenings were 12. ? ?DIAGNOSIS: ?Moderate Obstructive Sleep Apnea (G47.33) ? ?RECOMMENDATIONS: ?1. Clinical correlation of these findings is necessary. The decision to treat obstructive sleep apnea (OSA) is usually ?based on the presence of apnea symptoms or the presence of associated medical conditions such as Hypertension, ?Congestive Heart Failure, Atrial Fibrillation or Obesity. The most common symptoms of OSA are snoring, gasping for ?breath while sleeping, daytime sleepiness and fatigue. ? ?2. Initiating apnea therapy is recommended given the presence of symptoms and/or associated conditions. ?Recommend proceeding with one of the following: ? ? a. Auto-CPAP therapy with a  pressure range of 5-20cm H2O. ? ? b. An oral appliance (OA) that can be obtained from certain dentists with expertise in sleep medicine. These are ?primarily of use in non-obese patients with mild and moderate disease. ? ? c. An ENT consultation which may be useful to look for specific causes of obstruction and possible treatment ?options. ? ? d. If patient is intolerant to PAP therapy, consider referral to ENT for evaluation for hypoglossal nerve stimulator. ? ?3. Close follow-up is necessary to ensure success with CPAP or oral appliance therapy for maximum benefit . ? ?4. A follow-up oximetry study on CPAP is recommended to assess the adequacy of therapy and determine the need ?for supplemental oxygen or the potential need for Bi-level therapy. An arterial blood gas to determine the adequacy of ?baseline ventilation and oxygenation should also be considered. ? ?5. Healthy sleep recommendations include: adequate nightly sleep (normal 7-9 hrs/night), avoidance of caffeine after ?noon and alcohol near bedtime, and maintaining a sleep environment that is cool, dark and quiet. ? ?6. Weight loss for overweight patients is recommended. Even modest amounts of weight loss can significantly ?improve the severity of sleep apnea. ? ?7. Snoring recommendations include: weight loss where appropriate, side sleeping, and avoidance of alcohol before ?bed. ? ?8. Operation of motor vehicle should not be performed when sleepy. ? ?Signature: ?Electronically Signed: 06/23/21 ?Fransico Him, MD; [facility]; [place], [employer] Board of Sleep Medicine ? ?

## 2021-06-24 ENCOUNTER — Telehealth: Payer: Self-pay | Admitting: *Deleted

## 2021-06-24 NOTE — Telephone Encounter (Signed)
-----   Message from Sueanne Margarita, MD sent at 06/23/2021  5:08 PM EDT ----- ?Please let patient know that they have sleep apnea and recommend treating with CPAP.  Please order an auto CPAP from 4-15cm H2O with heated humidity and mask of choice.  Order overnight pulse ox on CPAP.  Followup with me in 6 weeks.  ?  ?

## 2021-06-24 NOTE — Telephone Encounter (Signed)
Patient notified of HST sleep study results and recommendations. She agrees to proceed with CPAP therapy. Orders will be sent to [facility]. ?

## 2021-06-27 ENCOUNTER — Ambulatory Visit: Payer: No Typology Code available for payment source

## 2021-06-27 DIAGNOSIS — R0683 Snoring: Secondary | ICD-10-CM

## 2021-06-28 ENCOUNTER — Ambulatory Visit (INDEPENDENT_AMBULATORY_CARE_PROVIDER_SITE_OTHER): Payer: No Typology Code available for payment source | Admitting: Internal Medicine

## 2021-06-28 ENCOUNTER — Encounter: Payer: Self-pay | Admitting: Internal Medicine

## 2021-06-28 DIAGNOSIS — F419 Anxiety disorder, unspecified: Secondary | ICD-10-CM | POA: Diagnosis not present

## 2021-06-28 DIAGNOSIS — D869 Sarcoidosis, unspecified: Secondary | ICD-10-CM | POA: Diagnosis not present

## 2021-06-28 DIAGNOSIS — F4323 Adjustment disorder with mixed anxiety and depressed mood: Secondary | ICD-10-CM

## 2021-06-28 DIAGNOSIS — J4520 Mild intermittent asthma, uncomplicated: Secondary | ICD-10-CM | POA: Diagnosis not present

## 2021-06-28 DIAGNOSIS — F410 Panic disorder [episodic paroxysmal anxiety] without agoraphobia: Secondary | ICD-10-CM

## 2021-06-28 DIAGNOSIS — F17210 Nicotine dependence, cigarettes, uncomplicated: Secondary | ICD-10-CM

## 2021-06-28 MED ORDER — CLOBETASOL PROPIONATE 0.05 % EX CREA: 1.0000 "application " | TOPICAL_CREAM | Freq: Two times a day (BID) | CUTANEOUS | 3 refills | Status: DC

## 2021-06-28 MED ORDER — ALPRAZOLAM 1 MG PO TABS: 0.5000 mg | ORAL_TABLET | Freq: Three times a day (TID) | ORAL | 2 refills | Status: DC | PRN

## 2021-06-28 NOTE — Assessment & Plan Note (Signed)
Quit smoking in Nov 2022 ?

## 2021-06-28 NOTE — Assessment & Plan Note (Signed)
On Buspar po On Xanax prn  Potential benefits of a long term benzodiazepines  use as well as potential risks  and complications were explained to the patient and were aknowledged. 

## 2021-06-28 NOTE — Assessment & Plan Note (Signed)
Caring for mom w/dementia, mom 59 lives w/67 yo dad, taking care of her father-in-law too.  ?

## 2021-06-28 NOTE — Progress Notes (Signed)
? ?Subjective:  ?Patient ID: Marilyn [name], female    DOB: 08/27/1955  Age: 66 y.o. MRN: [id number] ? ?CC: No chief complaint on file. ? ? ?HPI ?Marilyn [name] presents for stress, CAD, HTN, palpitations. ?Quit smoking in Nov 2022 ?Caring for mom w/dementia, mom 47 lives w/93 yo dad, taking care of her father-in-law too.  ? ?Outpatient Medications Prior to Visit  ?Medication Sig Dispense Refill  ? albuterol (VENTOLIN HFA) 108 (90 Base) MCG/ACT inhaler Inhale 2 puffs into the lungs every 6 (six) hours as needed for wheezing or shortness of breath. 18 g 11  ? amLODipine (NORVASC) 10 MG tablet Take 1 tablet (10 mg total) by mouth daily. 180 tablet 3  ? budesonide-formoterol (SYMBICORT) 80-4.5 MCG/ACT inhaler Inhale 2 puffs into the lungs 2 (two) times daily.    ? busPIRone (BUSPAR) 30 MG tablet TAKE 1/2 TO 1 (ONE-HALF TO ONE) TABLET BY MOUTH TWICE DAILY 60 tablet 5  ? celecoxib (CELEBREX) 200 MG capsule Take 1 capsule (200 mg total) by mouth daily as needed for moderate pain. 30 capsule 3  ? Cholecalciferol 5000 UNITS TABS Take 1 tablet by mouth 3 (three) times a week.    ? diclofenac Sodium (VOLTAREN) 1 % GEL Apply 4 g topically 4 (four) times daily. (Patient taking differently: Apply 4 g topically as needed (Joint pain).)    ? ipratropium (ATROVENT) 0.03 % nasal spray Place 2 sprays into both nostrils 2 (two) times daily as needed for rhinitis. 30 mL 0  ? Multiple Vitamin (MULTIVITAMIN WITH MINERALS) TABS tablet Take 1 tablet by mouth daily. Unknown strength    ? ALPRAZolam (XANAX) 1 MG tablet Take 0.5-1 tablets (0.5-1 mg total) by mouth 3 (three) times daily as needed for anxiety. 90 tablet 2  ? clobetasol cream (TEMOVATE) 1.74 % Apply 1 application topically 2 (two) times daily. (Patient taking differently: Apply 1 application. topically 2 (two) times daily as needed (rash).) 60 g 3  ? ?No facility-administered medications prior to visit.  ? ? ?ROS: ?Review of Systems  ?Constitutional:  Positive  for fatigue. Negative for activity change, appetite change, chills and unexpected weight change.  ?HENT:  Negative for congestion, mouth sores and sinus pressure.   ?Eyes:  Negative for visual disturbance.  ?Respiratory:  Negative for cough and chest tightness.   ?Gastrointestinal:  Negative for abdominal pain and nausea.  ?Genitourinary:  Negative for difficulty urinating, frequency and vaginal pain.  ?Musculoskeletal:  Negative for back pain and gait problem.  ?Skin:  Negative for pallor and rash.  ?Neurological:  Negative for dizziness, tremors, weakness, numbness and headaches.  ?Psychiatric/Behavioral:  Negative for confusion and sleep disturbance.   ? ?Objective:  ?BP 118/70 (BP Location: Left Arm, Patient Position: Sitting, Cuff Size: Large)   Pulse 68   Temp 97.8 ?F (36.6 ?C) (Oral)   Ht '5\' 5"'$  (1.651 m)   Wt 209 lb (94.8 kg)   SpO2 98%   BMI 34.78 kg/m?  ? ?BP Readings from Last 3 Encounters:  ?06/28/21 118/70  ?05/18/21 133/85  ?04/21/21 136/84  ? ? ?Wt Readings from Last 3 Encounters:  ?06/28/21 209 lb (94.8 kg)  ?05/18/21 211 lb 3.2 oz (95.8 kg)  ?04/21/21 213 lb (96.6 kg)  ? ? ?Physical Exam ?Constitutional:   ?   General: She is not in acute distress. ?   Appearance: She is well-developed. She is obese.  ?HENT:  ?   Head: Normocephalic.  ?   Right Ear: External ear normal.  ?  Left Ear: External ear normal.  ?   Nose: Nose normal.  ?Eyes:  ?   General:     ?   Right eye: No discharge.     ?   Left eye: No discharge.  ?   Conjunctiva/sclera: Conjunctivae normal.  ?   Pupils: Pupils are equal, round, and reactive to light.  ?Neck:  ?   Thyroid: No thyromegaly.  ?   Vascular: No JVD.  ?   Trachea: No tracheal deviation.  ?Cardiovascular:  ?   Rate and Rhythm: Normal rate and regular rhythm.  ?   Heart sounds: Normal heart sounds.  ?Pulmonary:  ?   Effort: No respiratory distress.  ?   Breath sounds: No stridor. No wheezing.  ?Abdominal:  ?   General: Bowel sounds are normal. There is no distension.   ?   Palpations: Abdomen is soft. There is no mass.  ?   Tenderness: There is no abdominal tenderness. There is no guarding or rebound.  ?Musculoskeletal:     ?   General: No tenderness.  ?   Cervical back: Normal range of motion and neck supple. No rigidity.  ?Lymphadenopathy:  ?   Cervical: No cervical adenopathy.  ?Skin: ?   Findings: No erythema or rash.  ?Neurological:  ?   Cranial Nerves: No cranial nerve deficit.  ?   Motor: No abnormal muscle tone.  ?   Coordination: Coordination normal.  ?   Deep Tendon Reflexes: Reflexes normal.  ?Psychiatric:     ?   Behavior: Behavior normal.     ?   Thought Content: Thought content normal.     ?   Judgment: Judgment normal.  ? ? ?Lab Results  ?Component Value Date  ? WBC 7.6 03/10/2021  ? HGB 11.7 (L) 03/10/2021  ? HCT 37.7 03/10/2021  ? PLT 244 03/10/2021  ? GLUCOSE 102 (H) 03/10/2021  ? CHOL 209 (H) 12/28/2020  ? TRIG 70.0 12/28/2020  ? HDL 69.20 12/28/2020  ? LDLDIRECT 118.9 12/18/2011  ? LDLCALC 126 (H) 12/28/2020  ? ALT 11 12/28/2020  ? AST 14 12/28/2020  ? NA 138 03/10/2021  ? K 3.7 03/10/2021  ? CL 109 03/10/2021  ? CREATININE 0.70 03/10/2021  ? BUN 10 03/10/2021  ? CO2 22 03/10/2021  ? TSH 1.25 12/28/2020  ? ? ?ECHOCARDIOGRAM COMPLETE ? ?Result Date: 03/30/2021 ?   ECHOCARDIOGRAM REPORT   Patient Name:   Marilyn [name] [facility] Date of Exam: 03/30/2021 Medical Rec #:  [id number]               Height:       65.0 in Accession #:    [id number]              Weight:       214.0 lb Date of Birth:  12/15/1955                BSA:          2.036 m? Patient Age:    44 years                BP:           147/83 mmHg Patient Gender: F                       HR:           66 bpm. Exam Location:  [place] Procedure: 2D Echo, Cardiac Doppler and Color Doppler Indications:  Dyspnea  History:        Patient has no prior history of Echocardiogram examinations.                 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  Sonographer:    Merrie Roof RDCS Referring Phys: [place]  1. Left ventricular ejection fraction, by estimation, is 60 to 65%. The left ventricle has normal function. The left ventricle has no regional wall motion abnormalities. There is mild left ventricular hypertrophy. Left ventricular diastolic parameters are consistent with Grade I diastolic dysfunction (impaired relaxation).  2. Right ventricular systolic function is normal. The right ventricular size is normal.  3. The mitral valve is normal in structure. No evidence of mitral valve regurgitation. No evidence of mitral stenosis.  4. The aortic valve is normal in structure. Aortic valve regurgitation is not visualized. No aortic stenosis is present.  5. The inferior vena cava is normal in size with greater than 50% respiratory variability, suggesting right atrial pressure of 3 mmHg. FINDINGS  Left Ventricle: Left ventricular ejection fraction, by estimation, is 60 to 65%. The left ventricle has normal function. The left ventricle has no regional wall motion abnormalities. The left ventricular internal cavity size was normal in size. There is  mild left ventricular hypertrophy. Left ventricular diastolic parameters are consistent with Grade I diastolic dysfunction (impaired relaxation). Right Ventricle: The right ventricular size is normal. No increase in right ventricular wall thickness. Right ventricular systolic function is normal. Left Atrium: Left atrial size was normal in size. Right Atrium: Right atrial size was normal in size. Pericardium: There is no evidence of pericardial effusion. Mitral Valve: The mitral valve is normal in structure. No evidence of mitral valve regurgitation. No evidence of mitral valve stenosis. Tricuspid Valve: The tricuspid valve is normal in structure. Tricuspid valve regurgitation is trivial. No  evidence of tricuspid stenosis. Aortic Valve: The aortic valve is normal in structure. Aortic valve regurgitation is not visualized. No aortic stenosis is present. Aortic valve mean gradient measures 5.0 mmHg. A

## 2021-06-28 NOTE — Assessment & Plan Note (Signed)
On Xanax prn ? Potential benefits of a long term opioids use as well as potential risks (i.e. addiction risk, apnea etc) and complications (i.e. Somnolence, constipation and others) were explained to the patient and were aknowledged. ?Caring for mom w/dementia, mom 56 lives w/67 yo dad, taking care of her father-in-law too.  ?

## 2021-08-18 ENCOUNTER — Ambulatory Visit
Admission: RE | Admit: 2021-08-18 | Discharge: 2021-08-18 | Disposition: A | Discharge status: Home / Self Care | Payer: No Typology Code available for payment source | Source: Ambulatory Visit | Attending: Internal Medicine | Admitting: Internal Medicine

## 2021-08-18 VITALS — BP 132/74 | HR 89 | Temp 98.4°F | Resp 18

## 2021-08-18 DIAGNOSIS — Z20822 Contact with and (suspected) exposure to covid-19: Secondary | ICD-10-CM | POA: Diagnosis not present

## 2021-08-18 NOTE — ED Triage Notes (Signed)
Pt present exposure to covid from a family member. Pt denies any symptoms at this time.

## 2021-08-18 NOTE — Discharge Instructions (Signed)
COVID test is pending.  We will call if results are positive.

## 2021-08-18 NOTE — ED Provider Notes (Signed)
[facility]    CSN: [id number] Arrival date & time: 08/18/21  1544      History   Chief Complaint Chief Complaint  Patient presents with   Covid Exposure    HPI Marilyn [name] is a 66 y.o. female.   Patient presents for COVID testing after being exposed to a family member who has had [place].  Patient denies any associated symptoms.     Past Medical History:  Diagnosis Date   Allergy    Anemia    hx   Anxiety    panic disorder   Breast cancer ([place]) 06/06/2011   bc  left breast 3 o'clock dx=invasive ductal ca uoqER/PR=positive   Cancer (HCC)    BREAST - left   Cigarette smoker    Colon polyp    Complication of anesthesia    DIFFICULTY AWAKENING, BP INCREASE AFTER CHOLECYSTECTEMY    COPD (chronic obstructive pulmonary disease) (HCC)    Depression    Diverticulosis of colon    DJD (degenerative joint disease)    DJD (degenerative joint disease)    Family history of breast cancer    Family history of colon cancer    Family history of pancreatic cancer    Family history of prostate cancer    Gallstones    GERD (gastroesophageal reflux disease)    Pt. denies having GERD. Unable to remove it.   Headache(784.0)    History of anemia    History of bronchitis    History of sarcoidosis    Hyperlipidemia    no meds needed   Incisional breast wound    AT AGE 74 BILATERAL INCISION TO BREAST MADE   Leg swelling    Panic disorder    Personal history of radiation therapy    S/P radiation therapy 08/17/11 - 09/28/11   LLQ - 50 Gy/25 Fractions with Boost of 10 Gy / 5 fractions   Sialoadenitis of submandibular gland 2016   Vitamin D deficiency     Patient Active Problem List   Diagnosis Date Noted   Leg swelling 04/18/2021   Incisional breast wound 04/18/2021   History of sarcoidosis 04/18/2021   History of bronchitis 04/18/2021   GERD (gastroesophageal reflux disease) 04/18/2021   Family history of prostate cancer 04/18/2021   Depression  12/24/2534   Complication of anesthesia 04/18/2021   Colon polyp 04/18/2021   Cigarette smoker 04/18/2021   Cancer ([place]) 04/18/2021   Coronary atherosclerosis 01/30/2021   Atherosclerosis of aorta ([place]) 01/30/2021   Primary hypertension 01/14/2021   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   Knee pain, left 08/28/2019   Elevated BP without diagnosis of hypertension 08/28/2019   Callus of foot 08/28/2019   Cholelithiasis 12/26/2018   Genetic testing 12/26/2017   Family history of breast cancer    Family history of pancreatic cancer    Family history of colon cancer    Abdominal pain 07/28/2016   Tobacco use disorder 05/08/2016   Morbid obesity ([place]) 03/13/2016   Dyspnea 11/09/2015   Well adult exam 07/23/2014   Sialoadenitis 06/02/2014   S/P radiation therapy 2016   Edema 07/07/2013   AB (asthmatic bronchitis) 06/23/2013   Anxiety    Malignant neoplasm of upper-outer quadrant of left breast in female, estrogen receptor positive ([place]) 06/09/2011   Allergy 06/06/2011   COLONIC POLYPS 01/25/2010   DIVERTICULOSIS OF COLON 01/25/2010   DEGENERATIVE JOINT DISEASE 11/06/2008   PANIC DISORDER 07/23/2008   Sarcoidosis 10/13/2007   Vitamin D deficiency 10/13/2007  ANEMIA 10/13/2007   BRONCHITIS 10/13/2007   Dyslipidemia 04/23/2007   Adjustment disorder with mixed anxiety and depressed mood 04/23/2007   Venous (peripheral) insufficiency 04/23/2007   HEADACHE 04/23/2007    Past Surgical History:  Procedure Laterality Date   BIOPSY BREAST     left breast  as a teenager  benign   BREAST BIOPSY Left 06/06/2011   BREAST EXCISIONAL BIOPSY Bilateral    BREAST LUMPECTOMY Left 2013   BREAST SURGERY  07/05/11   left breast lumpectomy with needle loc & axillary sln bx   BUNIONECTOMY  2011   bilateral by Dr Little Ishikawa   CHOLECYSTECTOMY  02/2019   COLONOSCOPY     polyp   cyst removed from right hand  [place]  2000   Dr Gae Gallop FINGER RELEASE  2008   Dr Burney Gauze    OB History   No obstetric history on file.    Obstetric Comments  Menarche age 60, nulliparity, HRT x 1 year, Hysterectomy          Home Medications    Prior to Admission medications   Medication Sig Start Date End Date Taking? Authorizing Provider  albuterol (VENTOLIN HFA) 108 (90 Base) MCG/ACT inhaler Inhale 2 puffs into the lungs every 6 (six) hours as needed for wheezing or shortness of breath. 12/25/19   Plotnikov, Evie Lacks, MD  ALPRAZolam Duanne Moron) 1 MG tablet Take 0.5-1 tablets (0.5-1 mg total) by mouth 3 (three) times daily as needed for anxiety. 06/28/21   Plotnikov, Evie Lacks, MD  amLODipine (NORVASC) 10 MG tablet Take 1 tablet (10 mg total) by mouth daily. 04/21/21 07/20/21  Park Liter, MD  budesonide-formoterol (SYMBICORT) 80-4.5 MCG/ACT inhaler Inhale 2 puffs into the lungs 2 (two) times daily.    [provider]  busPIRone (BUSPAR) 30 MG tablet TAKE 1/2 TO 1 (ONE-HALF TO ONE) TABLET BY MOUTH TWICE DAILY 06/20/21   Plotnikov, Evie Lacks, MD  celecoxib (CELEBREX) 200 MG capsule Take 1 capsule (200 mg total) by mouth daily as needed for moderate pain. 08/28/19   Plotnikov, Evie Lacks, MD  Cholecalciferol 5000 UNITS TABS Take 1 tablet by mouth 3 (three) times a week.    [provider]  clobetasol cream (TEMOVATE) 2.84 % Apply 1 application. topically 2 (two) times daily. 06/28/21   Plotnikov, Evie Lacks, MD  diclofenac Sodium (VOLTAREN) 1 % GEL Apply 4 g topically 4 (four) times daily. Patient taking differently: Apply 4 g topically as needed (Joint pain). 08/11/19   Lamptey, Myrene Galas, MD  ipratropium (ATROVENT) 0.03 % nasal spray Place 2 sprays into both nostrils 2 (two) times daily as needed for rhinitis. 07/27/20   Scot Jun, FNP  Multiple Vitamin (MULTIVITAMIN WITH MINERALS) TABS tablet Take 1 tablet by mouth daily. Unknown strength    [provider]  pantoprazole (PROTONIX) 40 MG tablet Take 1  tablet (40 mg total) by mouth daily. 04/28/19 08/11/19  Plotnikov, Evie Lacks, MD    Family History Family History  Problem Relation Age of Onset   Dementia Mother    Hypertension Mother    Anemia Mother    Other Mother        + H Pylori   Seizures Mother    Hypertension Father    Prostate cancer Father        dx over 26   Colon cancer Father 46  diagnosed 2009   Bladder Cancer Father        dx over 23   Colon polyps Father    Hypertension Sister    Breast cancer Sister        dx in her 76s; mat half sister   Pancreatic cancer Sister 107       d. 47   Lung cancer Sister    Hypertension Sister    Sarcoidosis Sister        mat 1/2 sister   Multiple sclerosis Brother        #2   Hypertension Brother        #1   Hyperlipidemia Brother    Prostate cancer Brother        dx under 75   Dementia Maternal Aunt    Dementia Maternal Aunt    Seizures Maternal Uncle    Lung cancer Maternal Uncle    Lung cancer Paternal Aunt    Breast cancer Cousin        pat first cousin d. 39   Sarcoidosis Cousin        mat first cousin   Breast cancer Cousin        maternal 2nd cousins - distant   Brain cancer Other        benign   Esophageal cancer Neg Hx    Stomach cancer Neg Hx    Rectal cancer Neg Hx     Social History Social History   Tobacco Use   Smoking status: Former    Packs/day: 0.50    Types: Cigarettes    Quit date: 12/28/2020    Years since quitting: 0.6   Smokeless tobacco: Never   Tobacco comments:    STOPPED AND RESTARTED  Vaping Use   Vaping Use: Never used  Substance Use Topics   Alcohol use: Yes    Alcohol/week: 0.0 - 1.0 standard drinks of alcohol    Comment: social   Drug use: No     Allergies   Lexapro [escitalopram oxalate], Microzide [hydrochlorothiazide], and Penicillins   Review of Systems Review of Systems Per HPI  Physical Exam Triage Vital Signs ED Triage Vitals  Enc Vitals Group     BP 08/18/21 1558 132/74     Pulse Rate  08/18/21 1558 89     Resp 08/18/21 1558 18     Temp 08/18/21 1558 98.4 F (36.9 C)     Temp Source 08/18/21 1558 Oral     SpO2 08/18/21 1558 95 %     Weight --      Height --      Head Circumference --      Peak Flow --      Pain Score 08/18/21 1557 0     Pain Loc --      Pain Edu? --      Excl. in [place]? --    No data found.  Updated Vital Signs BP 132/74 (BP Location: Left Arm)   Pulse 89   Temp 98.4 F (36.9 C) (Oral)   Resp 18   SpO2 95%   Visual Acuity Right Eye Distance:   Left Eye Distance:   Bilateral Distance:    Right Eye Near:   Left Eye Near:    Bilateral Near:     Physical Exam Constitutional:      General: She is not in acute distress.    Appearance: Normal appearance. She is not toxic-appearing or diaphoretic.  HENT:     Head: Normocephalic  and atraumatic.     Right Ear: Tympanic membrane and ear canal normal.     Left Ear: Tympanic membrane and ear canal normal.     Nose: Nose normal.     Mouth/Throat:     Mouth: Mucous membranes are moist.     Pharynx: No posterior oropharyngeal erythema.  Eyes:     Extraocular Movements: Extraocular movements intact.     Conjunctiva/sclera: Conjunctivae normal.  Cardiovascular:     Rate and Rhythm: Normal rate and regular rhythm.     Pulses: Normal pulses.     Heart sounds: Normal heart sounds.  Pulmonary:     Effort: Pulmonary effort is normal. No respiratory distress.     Breath sounds: Normal breath sounds.  Skin:    General: Skin is warm and dry.  Neurological:     General: No focal deficit present.     Mental Status: She is alert and oriented to person, place, and time. Mental status is at baseline.  Psychiatric:        Mood and Affect: Mood normal.        Behavior: Behavior normal.        Thought Content: Thought content normal.        Judgment: Judgment normal.      UC Treatments / Results  Labs (all labs ordered are listed, but only abnormal results are displayed) Labs Reviewed  NOVEL  CORONAVIRUS, NAA    EKG   Radiology No results found.  Procedures Procedures (including critical care time)  Medications Ordered in UC Medications - No data to display  Initial Impression / Assessment and Plan / UC Course  I have reviewed the triage vital signs and the nursing notes.  Pertinent labs & imaging results that were available during my care of the patient were reviewed by me and considered in my medical decision making (see chart for details).     Physical exam is benign.  COVID test is pending.  Awaiting results.  Discussed supportive care for COVID treatment if symptoms appear.  Patient verbalized understanding and was agreeable with plan. Final Clinical Impressions(s) / UC Diagnoses   Final diagnoses:  Close exposure to COVID-19 virus  Encounter for laboratory testing for COVID-19 virus     Discharge Instructions      COVID test is pending.  We will call if results are positive.    ED Prescriptions   None    PDMP not reviewed this encounter.   Teodora Medici, [place] 08/18/21 1623

## 2021-08-20 LAB — NOVEL CORONAVIRUS, NAA: SARS-CoV-2, NAA: NOT DETECTED

## 2021-09-20 ENCOUNTER — Other Ambulatory Visit: Payer: Self-pay | Admitting: Obstetrics and Gynecology

## 2021-09-20 ENCOUNTER — Other Ambulatory Visit: Payer: Self-pay | Admitting: Internal Medicine

## 2021-09-20 DIAGNOSIS — Z1231 Encounter for screening mammogram for malignant neoplasm of breast: Secondary | ICD-10-CM

## 2021-09-28 ENCOUNTER — Telehealth: Payer: Self-pay | Admitting: *Deleted

## 2021-09-28 ENCOUNTER — Encounter: Payer: Self-pay | Admitting: Internal Medicine

## 2021-09-28 ENCOUNTER — Ambulatory Visit: Payer: No Typology Code available for payment source | Admitting: Internal Medicine

## 2021-09-28 DIAGNOSIS — F419 Anxiety disorder, unspecified: Secondary | ICD-10-CM

## 2021-09-28 DIAGNOSIS — G8929 Other chronic pain: Secondary | ICD-10-CM

## 2021-09-28 DIAGNOSIS — M25562 Pain in left knee: Secondary | ICD-10-CM

## 2021-09-28 DIAGNOSIS — F4321 Adjustment disorder with depressed mood: Secondary | ICD-10-CM

## 2021-09-28 MED ORDER — CELECOXIB 200 MG PO CAPS: 200.0000 mg | ORAL_CAPSULE | Freq: Every day | ORAL | 3 refills | Status: DC | PRN

## 2021-09-28 MED ORDER — HYDROCODONE-ACETAMINOPHEN 5-325 MG PO TABS: 1.0000 | ORAL_TABLET | Freq: Four times a day (QID) | ORAL | 0 refills | Status: DC | PRN

## 2021-09-28 MED ORDER — BUSPIRONE HCL 30 MG PO TABS
ORAL_TABLET | ORAL | 5 refills | Status: DC
Start: 2021-09-28 — End: 2022-12-22

## 2021-09-28 MED ORDER — ALBUTEROL SULFATE HFA 108 (90 BASE) MCG/ACT IN AERS: 2.0000 | INHALATION_SPRAY | Freq: Four times a day (QID) | RESPIRATORY_TRACT | 11 refills | Status: DC | PRN

## 2021-09-28 NOTE — Assessment & Plan Note (Signed)
Worse Celebrex, Norco prn Brace Ortho ref if needed Blue-Emu cream was recommended to use 2-3 times a day

## 2021-09-28 NOTE — Assessment & Plan Note (Signed)
Mom died in 6/23 -- 67 yo

## 2021-09-28 NOTE — Progress Notes (Signed)
Subjective:  Patient ID: Marilyn [name], female    DOB: 01-Jun-1955  Age: 66 y.o. MRN: [id number]  CC: No chief complaint on file.   HPI Marilyn [name] presents for OA, HTN C/o L knee pain - new, worse  Outpatient Medications Prior to Visit  Medication Sig Dispense Refill   ALPRAZolam (XANAX) 1 MG tablet Take 0.5-1 tablets (0.5-1 mg total) by mouth 3 (three) times daily as needed for anxiety. 90 tablet 2   budesonide-formoterol (SYMBICORT) 80-4.5 MCG/ACT inhaler Inhale 2 puffs into the lungs 2 (two) times daily.     Cholecalciferol 5000 UNITS TABS Take 1 tablet by mouth 3 (three) times a week.     clobetasol cream (TEMOVATE) 1.44 % Apply 1 application. topically 2 (two) times daily. 60 g 3   diclofenac Sodium (VOLTAREN) 1 % GEL Apply 4 g topically 4 (four) times daily. (Patient taking differently: Apply 4 g topically as needed (Joint pain).)     ipratropium (ATROVENT) 0.03 % nasal spray Place 2 sprays into both nostrils 2 (two) times daily as needed for rhinitis. 30 mL 0   Multiple Vitamin (MULTIVITAMIN WITH MINERALS) TABS tablet Take 1 tablet by mouth daily. Unknown strength     albuterol (VENTOLIN HFA) 108 (90 Base) MCG/ACT inhaler Inhale 2 puffs into the lungs every 6 (six) hours as needed for wheezing or shortness of breath. 18 g 11   busPIRone (BUSPAR) 30 MG tablet TAKE 1/2 TO 1 (ONE-HALF TO ONE) TABLET BY MOUTH TWICE DAILY 60 tablet 5   celecoxib (CELEBREX) 200 MG capsule Take 1 capsule (200 mg total) by mouth daily as needed for moderate pain. 30 capsule 3   amLODipine (NORVASC) 10 MG tablet Take 1 tablet (10 mg total) by mouth daily. 180 tablet 3   No facility-administered medications prior to visit.    ROS: Review of Systems  Constitutional:  Negative for activity change, appetite change, chills, fatigue and unexpected weight change.  HENT:  Negative for congestion, mouth sores and sinus pressure.   Eyes:  Negative for visual disturbance.  Respiratory:   Negative for cough and chest tightness.   Gastrointestinal:  Negative for abdominal pain and nausea.  Genitourinary:  Negative for difficulty urinating, frequency and vaginal pain.  Musculoskeletal:  Positive for arthralgias and gait problem. Negative for back pain.  Skin:  Negative for pallor and rash.  Neurological:  Negative for dizziness, tremors, weakness, numbness and headaches.  Psychiatric/Behavioral:  Negative for confusion and sleep disturbance.     Objective:  BP 130/72 (BP Location: Left Arm, Patient Position: Sitting, Cuff Size: Normal)   Pulse 79   Temp 98.5 F (36.9 C) (Oral)   Ht '5\' 5"'$  (1.651 m)   Wt 210 lb (95.3 kg)   SpO2 96%   BMI 34.95 kg/m   BP Readings from Last 3 Encounters:  09/28/21 130/72  08/18/21 132/74  06/28/21 118/70    Wt Readings from Last 3 Encounters:  09/28/21 210 lb (95.3 kg)  06/28/21 209 lb (94.8 kg)  05/18/21 211 lb 3.2 oz (95.8 kg)    Physical Exam Constitutional:      General: She is not in acute distress.    Appearance: She is well-developed.  HENT:     Head: Normocephalic.     Right Ear: External ear normal.     Left Ear: External ear normal.     Nose: Nose normal.  Eyes:     General:        Right eye: No  discharge.        Left eye: No discharge.     Conjunctiva/sclera: Conjunctivae normal.     Pupils: Pupils are equal, round, and reactive to light.  Neck:     Thyroid: No thyromegaly.     Vascular: No JVD.     Trachea: No tracheal deviation.  Cardiovascular:     Rate and Rhythm: Normal rate and regular rhythm.     Heart sounds: Normal heart sounds.  Pulmonary:     Effort: No respiratory distress.     Breath sounds: No stridor. No wheezing.  Abdominal:     General: Bowel sounds are normal. There is no distension.     Palpations: Abdomen is soft. There is no mass.     Tenderness: There is no abdominal tenderness. There is no guarding or rebound.  Musculoskeletal:        General: No tenderness.     Cervical back:  Normal range of motion and neck supple. No rigidity.  Lymphadenopathy:     Cervical: No cervical adenopathy.  Skin:    Findings: No erythema or rash.  Neurological:     Cranial Nerves: No cranial nerve deficit.     Motor: No abnormal muscle tone.     Coordination: Coordination normal.     Deep Tendon Reflexes: Reflexes normal.  Psychiatric:        Behavior: Behavior normal.        Thought Content: Thought content normal.        Judgment: Judgment normal.     Lab Results  Component Value Date   WBC 7.6 03/10/2021   HGB 11.7 (L) 03/10/2021   HCT 37.7 03/10/2021   PLT 244 03/10/2021   GLUCOSE 102 (H) 03/10/2021   CHOL 209 (H) 12/28/2020   TRIG 70.0 12/28/2020   HDL 69.20 12/28/2020   LDLDIRECT 118.9 12/18/2011   LDLCALC 126 (H) 12/28/2020   ALT 11 12/28/2020   AST 14 12/28/2020   NA 138 03/10/2021   K 3.7 03/10/2021   CL 109 03/10/2021   CREATININE 0.70 03/10/2021   BUN 10 03/10/2021   CO2 22 03/10/2021   TSH 1.25 12/28/2020    No results found.  Assessment & Plan:   Problem List Items Addressed This Visit     Anxiety    On Xanax prn  Potential benefits of a long term benzodiazepines  use as well as potential risks  and complications were explained to the patient and were aknowledged.      Relevant Medications   busPIRone (BUSPAR) 30 MG tablet   Grief    Mom died in 08/30/2022 -- 65 yo      Knee pain, left    Worse Celebrex, Norco prn Brace Ortho ref if needed Blue-Emu cream was recommended to use 2-3 times a day       Relevant Orders   Ambulatory referral to Orthopedic Surgery      Meds ordered this encounter  Medications   DISCONTD: albuterol (VENTOLIN HFA) 108 (90 Base) MCG/ACT inhaler    Sig: Inhale 2 puffs into the lungs every 6 (six) hours as needed for wheezing or shortness of breath.    Dispense:  18 g    Refill:  11   busPIRone (BUSPAR) 30 MG tablet    Sig: TAKE 1/2 TO 1 (ONE-HALF TO ONE) TABLET BY MOUTH TWICE DAILY    Dispense:  60  tablet    Refill:  5   celecoxib (CELEBREX) 200 MG capsule  Sig: Take 1 capsule (200 mg total) by mouth daily as needed for moderate pain.    Dispense:  30 capsule    Refill:  3   HYDROcodone-acetaminophen (NORCO/VICODIN) 5-325 MG tablet    Sig: Take 1 tablet by mouth every 6 (six) hours as needed for severe pain.    Dispense:  20 tablet    Refill:  0      Follow-up: Return in about 3 months (around 12/29/2021) for a follow-up visit.  Walker Kehr, MD

## 2021-09-28 NOTE — Patient Instructions (Signed)
Blue-Emu cream was -- use 2-3 times a day ? ?

## 2021-09-28 NOTE — Telephone Encounter (Signed)
Rec'd fax pt needing PA on Ventolin.Marland Kitchen submitted on cover-my-meds w/ (Key: [id number]) Your information has been submitted to [facility].Marland KitchenJohny Chess

## 2021-09-29 NOTE — Telephone Encounter (Signed)
Rec'd determination med has been DENIED. Formulary alternative is Albuterol Sulfate, Levalbuterol Tartrate. Pls change.Marland KitchenChryl Heck

## 2021-10-02 MED ORDER — ALBUTEROL SULFATE HFA 108 (90 BASE) MCG/ACT IN AERS: 2.0000 | INHALATION_SPRAY | Freq: Four times a day (QID) | RESPIRATORY_TRACT | 5 refills | Status: DC | PRN

## 2021-10-02 NOTE — Telephone Encounter (Signed)
Okay albuterol.  Thanks

## 2021-10-02 NOTE — Addendum Note (Signed)
Addended by: Cassandria Anger on: 10/02/2021 10:32 AM   Modules accepted: Orders

## 2021-10-03 NOTE — Assessment & Plan Note (Signed)
On Xanax prn  Potential benefits of a long term benzodiazepines  use as well as potential risks  and complications were explained to the patient and were aknowledged.

## 2021-10-11 ENCOUNTER — Ambulatory Visit (INDEPENDENT_AMBULATORY_CARE_PROVIDER_SITE_OTHER): Payer: No Typology Code available for payment source

## 2021-10-11 ENCOUNTER — Ambulatory Visit: Payer: No Typology Code available for payment source | Admitting: Orthopaedic Surgery

## 2021-10-11 ENCOUNTER — Encounter: Payer: Self-pay | Admitting: Orthopaedic Surgery

## 2021-10-11 VITALS — Ht 65.0 in | Wt 210.0 lb

## 2021-10-11 DIAGNOSIS — G8929 Other chronic pain: Secondary | ICD-10-CM

## 2021-10-11 DIAGNOSIS — M25562 Pain in left knee: Secondary | ICD-10-CM

## 2021-10-11 DIAGNOSIS — M25561 Pain in right knee: Secondary | ICD-10-CM | POA: Diagnosis not present

## 2021-10-11 NOTE — Progress Notes (Signed)
Office Visit Note   Patient: Marilyn [name]           Date of Birth: 1955-08-13           MRN: [id number] Visit Date: 10/11/2021              Requested by: Marilyn Anger, MD [place],  [place] [id number] PCP: Plotnikov, Evie Lacks, MD   Assessment & Plan: Visit Diagnoses:  1. Chronic pain of both knees     Plan: Impression is bilateral knee arthritis.  This is advanced in the left knee and moderate in the right knee.  Treatment options were reviewed.  She would like to trial a reaction brace and would like to think about Visco injection.  Will also work on weight loss.  Follow-up as needed.  Follow-Up Instructions: No follow-ups on file.   Orders:  Orders Placed This Encounter  Procedures   XR KNEE 3 VIEW LEFT   XR KNEE 3 VIEW RIGHT   No orders of the defined types were placed in this encounter.     Procedures: No procedures performed   Clinical Data: No additional findings.   Subjective: Chief Complaint  Patient presents with   Left Knee - Pain    HPI Marilyn [name] is a 66 year old female here for bilateral knee pain worse on the left for the last 2 months.  Denies any injuries.  Worse with laying down and leg extension.  Feels popping and wakes her up at night.  Has had several cortisone injections in the past that worked initially but now have lost their effectiveness.  Currently taking care of her elderly dad. Review of Systems  Constitutional: Negative.   HENT: Negative.    Eyes: Negative.   Respiratory: Negative.    Cardiovascular: Negative.   Endocrine: Negative.   Musculoskeletal: Negative.   Neurological: Negative.   Hematological: Negative.   Psychiatric/Behavioral: Negative.    All other systems reviewed and are negative.    Objective: Vital Signs: Ht '5\' 5"'$  (1.651 m)   Wt 210 lb (95.3 kg)   BMI 34.95 kg/m   Physical Exam Vitals and nursing note reviewed.  Constitutional:      Appearance: She is well-developed.   HENT:     Head: Atraumatic.     Nose: Nose normal.  Eyes:     Extraocular Movements: Extraocular movements intact.  Cardiovascular:     Pulses: Normal pulses.  Pulmonary:     Effort: Pulmonary effort is normal.  Abdominal:     Palpations: Abdomen is soft.  Musculoskeletal:     Cervical back: Neck supple.  Skin:    General: Skin is warm.     Capillary Refill: Capillary refill takes less than 2 seconds.  Neurological:     Mental Status: She is alert. Mental status is at baseline.  Psychiatric:        Behavior: Behavior normal.        Thought Content: Thought content normal.        Judgment: Judgment normal.     Ortho Exam Examination of bilateral knees shows no joint effusion.  Medial and lateral joint line tenderness.  Collaterals and cruciates are stable. Specialty Comments:  No specialty comments available.  Imaging: No results found.   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   Knee pain, left 08/28/2019   Elevated BP without diagnosis of hypertension 08/28/2019   Callus of foot 08/28/2019   Cholelithiasis 12/26/2018   Genetic testing 12/26/2017   Family history of breast cancer    Family history of pancreatic cancer    Family history of colon cancer    Abdominal pain 07/28/2016   Tobacco use disorder 05/08/2016   Morbid obesity ([facility]) 03/13/2016   Dyspnea 11/09/2015   Well adult exam  07/23/2014   Sialoadenitis 06/02/2014   S/P radiation therapy 2016   Edema 07/07/2013   AB (asthmatic bronchitis) 06/23/2013   Anxiety    Malignant neoplasm of upper-outer quadrant of left breast in female, estrogen receptor positive ([facility]) 06/09/2011   Allergy 06/06/2011   COLONIC POLYPS 01/25/2010   DIVERTICULOSIS OF COLON 01/25/2010   DEGENERATIVE JOINT DISEASE 11/06/2008   PANIC DISORDER 07/23/2008   Sarcoidosis 10/13/2007   Vitamin D deficiency 10/13/2007   ANEMIA 10/13/2007   BRONCHITIS 10/13/2007   Dyslipidemia 04/23/2007   Adjustment disorder with mixed anxiety and depressed mood 04/23/2007   Venous (peripheral) insufficiency 04/23/2007   HEADACHE 04/23/2007   Past Medical History:  Diagnosis Date   Allergy    Anemia    hx   Anxiety    panic disorder   Breast cancer ([facility]) 06/06/2011   bc  left breast 3 o'clock dx=invasive ductal ca uoqER/PR=positive   Cancer (HCC)    BREAST - left   Cigarette smoker    Colon polyp    Complication of anesthesia    DIFFICULTY AWAKENING, BP INCREASE AFTER CHOLECYSTECTEMY    COPD (chronic obstructive pulmonary disease) (HCC)    Depression    Diverticulosis of colon    DJD (degenerative joint disease)    DJD (degenerative joint disease)    Family history of breast cancer    Family history of colon cancer    Family history of pancreatic cancer    Family history of prostate cancer    Gallstones    GERD (gastroesophageal reflux disease)    Pt. denies having GERD. Unable to remove it.   Headache(784.0)    History of anemia    History of bronchitis    History of sarcoidosis    Hyperlipidemia    no meds needed   Incisional breast wound    AT AGE 72 BILATERAL INCISION TO BREAST MADE   Leg swelling    Panic disorder    Personal history of radiation therapy    S/P radiation therapy 08/17/11 - 09/28/11   LLQ - 50 Gy/25 Fractions with Boost of 10 Gy / 5 fractions   Sialoadenitis of submandibular gland 2016   Vitamin D deficiency      Family History  Problem Relation Age of Onset   Dementia Mother    Hypertension Mother    Anemia Mother    Other Mother        + H Pylori   Seizures Mother    Hypertension Father    Prostate cancer Father        dx over 12   Colon cancer Father 4  diagnosed 2009   Bladder Cancer Father        dx over 52   Colon polyps Father    Hypertension Sister    Breast cancer Sister        dx in her 1s; mat half sister   Pancreatic cancer Sister 25       d. 17   Lung cancer Sister    Hypertension Sister    Sarcoidosis Sister        mat 1/2 sister   Multiple sclerosis Brother        #2   Hypertension Brother        #1   Hyperlipidemia Brother    Prostate cancer Brother        dx under 39   Dementia Maternal Aunt    Dementia Maternal Aunt    Seizures Maternal Uncle    Lung cancer Maternal Uncle    Lung cancer Paternal Aunt    Breast cancer Cousin        pat first cousin d. 46   Sarcoidosis Cousin        mat first cousin   Breast cancer Cousin        maternal 2nd cousins - distant   Brain cancer Other        benign   Esophageal cancer Neg Hx    Stomach cancer Neg Hx    Rectal cancer Neg Hx     Past Surgical History:  Procedure Laterality Date   BIOPSY BREAST     left breast  as a teenager  benign   BREAST BIOPSY Left 06/06/2011   BREAST EXCISIONAL BIOPSY Bilateral    BREAST LUMPECTOMY Left 2013   BREAST SURGERY  07/05/11   left breast lumpectomy with needle loc & axillary sln bx   BUNIONECTOMY  2011   bilateral by Dr Little Ishikawa   CHOLECYSTECTOMY  02/2019   COLONOSCOPY     polyp   cyst removed from right hand  [place]  2000   Dr Gae Gallop FINGER RELEASE  2008   Dr Burney Gauze   Social History   Occupational History   Occupation: retired  Tobacco Use   Smoking status: Former    Packs/day: 0.50    Types: Cigarettes    Quit date: 12/28/2020    Years since quitting: 0.7   Smokeless  tobacco: Never   Tobacco comments:    STOPPED AND RESTARTED  Vaping Use   Vaping Use: Never used  Substance and Sexual Activity   Alcohol use: Yes    Alcohol/week: 0.0 - 1.0 standard drinks of alcohol    Comment: social   Drug use: No   Sexual activity: Not Currently    Birth control/protection: Surgical    Comment: menses age 67,hrt started  2000

## 2021-10-13 ENCOUNTER — Encounter: Payer: Self-pay | Admitting: Cardiology

## 2021-10-13 ENCOUNTER — Ambulatory Visit: Payer: No Typology Code available for payment source | Admitting: Cardiology

## 2021-10-13 VITALS — BP 130/68 | HR 72 | Ht 65.0 in | Wt 212.1 lb

## 2021-10-13 DIAGNOSIS — E785 Hyperlipidemia, unspecified: Secondary | ICD-10-CM

## 2021-10-13 DIAGNOSIS — I2510 Atherosclerotic heart disease of native coronary artery without angina pectoris: Secondary | ICD-10-CM

## 2021-10-13 DIAGNOSIS — D869 Sarcoidosis, unspecified: Secondary | ICD-10-CM | POA: Diagnosis not present

## 2021-10-13 DIAGNOSIS — R0609 Other forms of dyspnea: Secondary | ICD-10-CM

## 2021-10-13 DIAGNOSIS — I2583 Coronary atherosclerosis due to lipid rich plaque: Secondary | ICD-10-CM

## 2021-10-13 DIAGNOSIS — I10 Essential (primary) hypertension: Secondary | ICD-10-CM | POA: Diagnosis not present

## 2021-10-13 NOTE — Progress Notes (Signed)
Cardiology Office Note:    Date:  10/13/2021   ID:  Marilyn [name], DOB 15-Jun-1955, MRN [id number]  PCP:  Cassandria Anger, MD  Cardiologist:  Jenne Campus, MD    Referring MD: Cassandria Anger, MD   No chief complaint on file. Doing fine  History of Present Illness:    Marilyn [name] is a 66 y.o. female with past medical history significant for essential hypertension which was somewhat difficult to control now seems to be under control, dyslipidemia, history of sarcoidosis, she was referred to [place] because she did have calcium on her coronary arteries.  She did have calcium score performed which was relatively low 79.6% but the key is risk factors modifications now.  She is coming to my office for regular follow-up.  She lost her mother who had advanced dementia she is still taking care of her father-in-law and her father.  Overall feeling well denies have any chest pain tightness squeezing pressure burning chest.  Biggest complaints appears to be pain in her knees.  Past Medical History:  Diagnosis Date   Allergy    Anemia    hx   Anxiety    panic disorder   Breast cancer ([facility]) 06/06/2011   bc  left breast 3 o'clock dx=invasive ductal ca uoqER/PR=positive   Cancer (HCC)    BREAST - left   Cigarette smoker    Colon polyp    Complication of anesthesia    DIFFICULTY AWAKENING, BP INCREASE AFTER CHOLECYSTECTEMY    COPD (chronic obstructive pulmonary disease) (HCC)    Depression    Diverticulosis of colon    DJD (degenerative joint disease)    DJD (degenerative joint disease)    Family history of breast cancer    Family history of colon cancer    Family history of pancreatic cancer    Family history of prostate cancer    Gallstones    GERD (gastroesophageal reflux disease)    Pt. denies having GERD. Unable to remove it.   Headache(784.0)    History of anemia    History of bronchitis    History of sarcoidosis    Hyperlipidemia    no meds needed    Incisional breast wound    AT AGE 79 BILATERAL INCISION TO BREAST MADE   Leg swelling    Panic disorder    Personal history of radiation therapy    S/P radiation therapy 08/17/11 - 09/28/11   LLQ - 50 Gy/25 Fractions with Boost of 10 Gy / 5 fractions   Sialoadenitis of submandibular gland 2016   Vitamin D deficiency     Past Surgical History:  Procedure Laterality Date   BIOPSY BREAST     left breast  as a teenager  benign   BREAST BIOPSY Left 06/06/2011   BREAST EXCISIONAL BIOPSY Bilateral    BREAST LUMPECTOMY Left 2013   BREAST SURGERY  07/05/11   left breast lumpectomy with needle loc & axillary sln bx   BUNIONECTOMY  2011   bilateral by Dr Little Ishikawa   CHOLECYSTECTOMY  02/2019   COLONOSCOPY     polyp   cyst removed from right hand  [place]  2000   Dr Gae Gallop FINGER RELEASE  2008   Dr Burney Gauze    Current Medications: Current Meds  Medication Sig   albuterol (VENTOLIN HFA) 108 (90 Base) MCG/ACT inhaler Inhale 2 puffs into the lungs  every 6 (six) hours as needed for wheezing or shortness of breath.   ALPRAZolam (XANAX) 1 MG tablet Take 0.5-1 tablets (0.5-1 mg total) by mouth 3 (three) times daily as needed for anxiety.   amLODipine (NORVASC) 10 MG tablet Take 1 tablet (10 mg total) by mouth daily.   budesonide-formoterol (SYMBICORT) 80-4.5 MCG/ACT inhaler Inhale 2 puffs into the lungs 2 (two) times daily.   busPIRone (BUSPAR) 30 MG tablet TAKE 1/2 TO 1 (ONE-HALF TO ONE) TABLET BY MOUTH TWICE DAILY   celecoxib (CELEBREX) 200 MG capsule Take 1 capsule (200 mg total) by mouth daily as needed for moderate pain.   Cholecalciferol 5000 UNITS TABS Take 1 tablet by mouth 3 (three) times a week.   clobetasol cream (TEMOVATE) 2.83 % Apply 1 application. topically 2 (two) times daily.   diclofenac Sodium (VOLTAREN) 1 % GEL Apply 4 g topically 4 (four) times daily. (Patient taking differently: Apply 4 g topically as  needed (Joint pain).)   HYDROcodone-acetaminophen (NORCO/VICODIN) 5-325 MG tablet Take 1 tablet by mouth every 6 (six) hours as needed for severe pain.   ipratropium (ATROVENT) 0.03 % nasal spray Place 2 sprays into both nostrils 2 (two) times daily as needed for rhinitis.   Multiple Vitamin (MULTIVITAMIN WITH MINERALS) TABS tablet Take 1 tablet by mouth daily. Unknown strength     Allergies:   Lexapro [escitalopram oxalate], Microzide [hydrochlorothiazide], and Penicillins   Social History   Socioeconomic History   Marital status: Married    Spouse name: Not on file   Number of children: 2   Years of education: Not on file   Highest education level: Not on file  Occupational History   Occupation: retired  Tobacco Use   Smoking status: Former    Packs/day: 0.50    Types: Cigarettes    Quit date: 12/28/2020    Years since quitting: 0.7   Smokeless tobacco: Never   Tobacco comments:    STOPPED AND RESTARTED  Vaping Use   Vaping Use: Never used  Substance and Sexual Activity   Alcohol use: Yes    Alcohol/week: 0.0 - 1.0 standard drinks of alcohol    Comment: social   Drug use: No   Sexual activity: Not Currently    Birth control/protection: Surgical    Comment: menses age 68,hrt started  05-16-98  Other Topics Concern   Not on file  Social History Narrative   Widowed - husband died 05-16-2003   2 step-children   Exercises 3x per week   2 cups of caffeine daily         Social Determinants of Health   Financial Resource Strain: Medium Risk (05/18/2021)   Overall Financial Resource Strain (CARDIA)    Difficulty of Paying Living Expenses: Somewhat hard  Food Insecurity: No Food Insecurity (05/18/2021)   Hunger Vital Sign    Worried About Running Out of Food in the Last Year: Never true    Ran Out of Food in the Last Year: Never true  Transportation Needs: No Transportation Needs (05/18/2021)   PRAPARE - Transportation    Lack of Transportation (Medical): No    Lack of  Transportation (Non-Medical): No  Physical Activity: Inactive (05/18/2021)   Exercise Vital Sign    Days of Exercise per Week: 0 days    Minutes of Exercise per Session: 0 min  Stress: Not on file  Social Connections: Not on file     Family History: The patient's family history includes Anemia in her mother; Bladder Cancer  in her father; Brain cancer in an other family member; Breast cancer in her cousin, cousin, and sister; Colon cancer (age of onset: 72) in her father; Colon polyps in her father; Dementia in her maternal aunt, maternal aunt, and mother; Hyperlipidemia in her brother; Hypertension in her brother, father, mother, sister, and sister; Lung cancer in her maternal uncle, paternal aunt, and sister; Multiple sclerosis in her brother; Other in her mother; Pancreatic cancer (age of onset: 19) in her sister; Prostate cancer in her brother and father; Sarcoidosis in her cousin and sister; Seizures in her maternal uncle and mother. There is no history of Esophageal cancer, Stomach cancer, or Rectal cancer. ROS:   Please see the history of present illness.    All 14 point review of systems negative except as described per history of present illness  EKGs/Labs/Other Studies Reviewed:      Recent Labs: 12/28/2020: ALT 11; TSH 1.25 03/10/2021: B Natriuretic Peptide 157.7; BUN 10; Creatinine, Ser 0.70; Hemoglobin 11.7; Platelets 244; Potassium 3.7; Sodium 138  Recent Lipid Panel    Component Value Date/Time   CHOL 209 (H) 12/28/2020 1138   TRIG 70.0 12/28/2020 1138   HDL 69.20 12/28/2020 1138   CHOLHDL 3 12/28/2020 1138   VLDL 14.0 12/28/2020 1138   LDLCALC 126 (H) 12/28/2020 1138   LDLDIRECT 118.9 12/18/2011 1138    Physical Exam:    VS:  BP 130/68 (BP Location: Right Arm, Patient Position: Sitting)   Pulse 72   Ht '5\' 5"'$  (1.651 m)   Wt 212 lb 1.3 oz (96.2 kg)   SpO2 98%   BMI 35.29 kg/m     Wt Readings from Last 3 Encounters:  10/13/21 212 lb 1.3 oz (96.2 kg)  10/11/21  210 lb (95.3 kg)  09/28/21 210 lb (95.3 kg)     GEN:  Well nourished, well developed in no acute distress HEENT: Normal NECK: No JVD; No carotid bruits LYMPHATICS: No lymphadenopathy CARDIAC: RRR, no murmurs, no rubs, no gallops RESPIRATORY:  Clear to auscultation without rales, wheezing or rhonchi  ABDOMEN: Soft, non-tender, non-distended MUSCULOSKELETAL:  No edema; No deformity  SKIN: Warm and dry LOWER EXTREMITIES: no swelling NEUROLOGIC:  Alert and oriented x 3 PSYCHIATRIC:  Normal affect   ASSESSMENT:    1. Dyslipidemia   2. Primary hypertension   3. Coronary atherosclerosis due to lipid rich plaque   4. Sarcoidosis   5. Dyspnea on exertion    PLAN:    In order of problems listed above:  Dyslipidemia I will ask her to have direct LDL today.  Based on that to decide if we need to start statin which I suspect we will have to.  I would like to see her LDL less than 70. Essential hypertension finally seems to well controlled continue present management. Sarcoidosis stable. Dyspnea on exertion stable   Medication Adjustments/Labs and Tests Ordered: Current medicines are reviewed at length with the patient today.  Concerns regarding medicines are outlined above.  Orders Placed This Encounter  Procedures   LDL cholesterol, direct   Medication changes: No orders of the defined types were placed in this encounter.   Signed, Park Liter, MD, [facility] 10/13/2021 11:45 AM    [place]

## 2021-10-13 NOTE — Patient Instructions (Signed)
Medication Instructions:  Your physician recommends that you continue on your current medications as directed. Please refer to the Current Medication list given to you today.  *If you need a refill on your cardiac medications before your next appointment, please call your pharmacy*   Lab Work: 2nd Floor- [address] Direct LDL- today If you have labs (blood work) drawn today and your tests are completely normal, you will receive your results only by: MyChart Message (if you have MyChart) OR A paper copy in the mail If you have any lab test that is abnormal or we need to change your treatment, we will call you to review the results.   Testing/Procedures: None Ordered   Follow-Up: At [facility], you and your health needs are our priority.  As part of our continuing mission to provide you with exceptional heart care, we have created designated Provider Care Teams.  These Care Teams include your primary Cardiologist (physician) and Advanced Practice Providers (APPs -  Physician Assistants and Nurse Practitioners) who all work together to provide you with the care you need, when you need it.  We recommend signing up for the patient portal called "MyChart".  Sign up information is provided on this After Visit Summary.  MyChart is used to connect with patients for Virtual Visits (Telemedicine).  Patients are able to view lab/test results, encounter notes, upcoming appointments, etc.  Non-urgent messages can be sent to your provider as well.   To learn more about what you can do with MyChart, go to [email or web address].com.    Your next appointment:   12 month(s)  The format for your next appointment:   In Person  Provider:   Robert Krasowski, MD    Other Instructions NA  

## 2021-10-14 ENCOUNTER — Ambulatory Visit: Payer: No Typology Code available for payment source

## 2021-10-15 LAB — LDL CHOLESTEROL, DIRECT: LDL Direct: 102 mg/dL — ABNORMAL HIGH (ref 0–99)

## 2021-10-19 ENCOUNTER — Telehealth: Payer: Self-pay

## 2021-10-19 NOTE — Telephone Encounter (Signed)
Spoke with pt about lab results. She wants to work on diet and exercise and have her cholesterol checked in 3 months with her PCP.

## 2021-10-26 ENCOUNTER — Ambulatory Visit
Admission: RE | Admit: 2021-10-26 | Discharge: 2021-10-26 | Disposition: A | Discharge status: Home / Self Care | Payer: No Typology Code available for payment source | Source: Ambulatory Visit | Attending: Obstetrics and Gynecology | Admitting: Obstetrics and Gynecology

## 2021-10-26 DIAGNOSIS — Z1231 Encounter for screening mammogram for malignant neoplasm of breast: Secondary | ICD-10-CM

## 2021-12-28 ENCOUNTER — Telehealth: Payer: Self-pay | Admitting: Adult Health

## 2021-12-28 NOTE — Telephone Encounter (Signed)
Rescheduled appointment per provider PAL. Left voicemail. 

## 2021-12-29 ENCOUNTER — Encounter: Payer: Self-pay | Admitting: Internal Medicine

## 2021-12-29 ENCOUNTER — Ambulatory Visit: Payer: No Typology Code available for payment source | Admitting: Internal Medicine

## 2021-12-29 VITALS — BP 144/72 | HR 63 | Temp 97.8°F | Ht 65.0 in | Wt 214.8 lb

## 2021-12-29 DIAGNOSIS — J449 Chronic obstructive pulmonary disease, unspecified: Secondary | ICD-10-CM | POA: Diagnosis not present

## 2021-12-29 DIAGNOSIS — B079 Viral wart, unspecified: Secondary | ICD-10-CM

## 2021-12-29 DIAGNOSIS — F4321 Adjustment disorder with depressed mood: Secondary | ICD-10-CM

## 2021-12-29 DIAGNOSIS — F419 Anxiety disorder, unspecified: Secondary | ICD-10-CM | POA: Diagnosis not present

## 2021-12-29 MED ORDER — BUDESONIDE-FORMOTEROL FUMARATE 80-4.5 MCG/ACT IN AERO: 2.0000 | INHALATION_SPRAY | Freq: Two times a day (BID) | RESPIRATORY_TRACT | 11 refills | Status: DC

## 2021-12-29 MED ORDER — TRIAMCINOLONE ACETONIDE 0.1 % EX OINT: 1.0000 | TOPICAL_OINTMENT | Freq: Three times a day (TID) | CUTANEOUS | 2 refills | Status: DC

## 2021-12-29 NOTE — Progress Notes (Signed)
Subjective:  Patient ID: Marilyn [name], female    DOB: October 27, 1955  Age: 66 y.o. MRN: [id number]  CC: Follow-up (3 month f/u) and Skin Tag (Under (L) eye )   HPI Marilyn [name] presents for grief - father in law just passed on. F/u COPD, anxiety, wt gain  C/o a wart  Outpatient Medications Prior to Visit  Medication Sig Dispense Refill   albuterol (VENTOLIN HFA) 108 (90 Base) MCG/ACT inhaler Inhale 2 puffs into the lungs every 6 (six) hours as needed for wheezing or shortness of breath. 18 g 5   ALPRAZolam (XANAX) 1 MG tablet Take 0.5-1 tablets (0.5-1 mg total) by mouth 3 (three) times daily as needed for anxiety. 90 tablet 2   busPIRone (BUSPAR) 30 MG tablet TAKE 1/2 TO 1 (ONE-HALF TO ONE) TABLET BY MOUTH TWICE DAILY 60 tablet 5   celecoxib (CELEBREX) 200 MG capsule Take 1 capsule (200 mg total) by mouth daily as needed for moderate pain. 30 capsule 3   Cholecalciferol 5000 UNITS TABS Take 1 tablet by mouth 3 (three) times a week.     clobetasol cream (TEMOVATE) 8.24 % Apply 1 application. topically 2 (two) times daily. 60 g 3   diclofenac Sodium (VOLTAREN) 1 % GEL Apply 4 g topically 4 (four) times daily. (Patient taking differently: Apply 4 g topically as needed (Joint pain).)     HYDROcodone-acetaminophen (NORCO/VICODIN) 5-325 MG tablet Take 1 tablet by mouth every 6 (six) hours as needed for severe pain. 20 tablet 0   ipratropium (ATROVENT) 0.03 % nasal spray Place 2 sprays into both nostrils 2 (two) times daily as needed for rhinitis. 30 mL 0   Multiple Vitamin (MULTIVITAMIN WITH MINERALS) TABS tablet Take 1 tablet by mouth daily. Unknown strength     budesonide-formoterol (SYMBICORT) 80-4.5 MCG/ACT inhaler Inhale 2 puffs into the lungs 2 (two) times daily.     amLODipine (NORVASC) 10 MG tablet Take 1 tablet (10 mg total) by mouth daily. 180 tablet 3   No facility-administered medications prior to visit.    ROS: Review of Systems  Constitutional:  Positive for  unexpected weight change. Negative for activity change, appetite change, chills and fatigue.  HENT:  Negative for congestion, mouth sores and sinus pressure.   Eyes:  Negative for visual disturbance.  Respiratory:  Positive for wheezing. Negative for cough and chest tightness.   Gastrointestinal:  Negative for abdominal pain and nausea.  Genitourinary:  Negative for difficulty urinating, frequency and vaginal pain.  Musculoskeletal:  Negative for back pain and gait problem.  Skin:  Negative for pallor and rash.  Neurological:  Negative for dizziness, tremors, weakness, numbness and headaches.  Psychiatric/Behavioral:  Negative for confusion, sleep disturbance and suicidal ideas. The patient is nervous/anxious.     Objective:  BP (!) 144/72 (BP Location: Left Arm)   Pulse 63   Temp 97.8 F (36.6 C) (Oral)   Ht '5\' 5"'$  (1.651 m)   Wt 214 lb 12.8 oz (97.4 kg)   SpO2 99%   BMI 35.74 kg/m   BP Readings from Last 3 Encounters:  12/29/21 (!) 144/72  10/13/21 130/68  09/28/21 130/72    Wt Readings from Last 3 Encounters:  12/29/21 214 lb 12.8 oz (97.4 kg)  10/13/21 212 lb 1.3 oz (96.2 kg)  10/11/21 210 lb (95.3 kg)    Physical Exam Constitutional:      General: She is not in acute distress.    Appearance: She is well-developed. She is obese.  HENT:     Head: Normocephalic.     Right Ear: External ear normal.     Left Ear: External ear normal.     Nose: Nose normal.  Eyes:     General:        Right eye: No discharge.        Left eye: No discharge.     Conjunctiva/sclera: Conjunctivae normal.     Pupils: Pupils are equal, round, and reactive to light.  Neck:     Thyroid: No thyromegaly.     Vascular: No JVD.     Trachea: No tracheal deviation.  Cardiovascular:     Rate and Rhythm: Normal rate and regular rhythm.     Heart sounds: Normal heart sounds.  Pulmonary:     Effort: No respiratory distress.     Breath sounds: No stridor. No wheezing.  Abdominal:     General:  Bowel sounds are normal. There is no distension.     Palpations: Abdomen is soft. There is no mass.     Tenderness: There is no abdominal tenderness. There is no guarding or rebound.  Musculoskeletal:        General: No tenderness.     Cervical back: Normal range of motion and neck supple. No rigidity.  Lymphadenopathy:     Cervical: No cervical adenopathy.  Skin:    Findings: No erythema or rash.  Neurological:     Cranial Nerves: No cranial nerve deficit.     Motor: No abnormal muscle tone.     Coordination: Coordination normal.     Deep Tendon Reflexes: Reflexes normal.  Psychiatric:        Behavior: Behavior normal.        Thought Content: Thought content normal.        Judgment: Judgment normal.   Wart x1 on the L cheek    Procedure Note :     Procedure : Cryosurgery   Indication:  Wart x1 on the L cheek   Risks including unsuccessful procedure , bleeding, infection, bruising, scar, a need for a repeat  procedure and others were explained to the patient in detail as well as the benefits. Informed consent was obtained verbally.   1  lesion(s)  on  L cheek  was/were treated with liquid nitrogen on a Q-tip in a usual fasion . Band-Aid was applied and antibiotic ointment was given for a later use.   Tolerated well. Complications none.   Postprocedure instructions :     Keep the wounds clean. You can wash them with liquid soap and water. Pat dry with gauze or a Kleenex tissue  Before applying antibiotic ointment .   You need to report immediately  if  any signs of infection develop.   Lab Results  Component Value Date   WBC 7.6 03/10/2021   HGB 11.7 (L) 03/10/2021   HCT 37.7 03/10/2021   PLT 244 03/10/2021   GLUCOSE 102 (H) 03/10/2021   CHOL 209 (H) 12/28/2020   TRIG 70.0 12/28/2020   HDL 69.20 12/28/2020   LDLDIRECT 102 (H) 10/14/2021   LDLCALC 126 (H) 12/28/2020   ALT 11 12/28/2020   AST 14 12/28/2020   NA 138 03/10/2021   K 3.7 03/10/2021   CL 109  03/10/2021   CREATININE 0.70 03/10/2021   BUN 10 03/10/2021   CO2 22 03/10/2021   TSH 1.25 12/28/2020    MM 3D SCREEN BREAST BILATERAL  Result Date: 10/28/2021 CLINICAL DATA:  Screening. EXAM: DIGITAL SCREENING BILATERAL  MAMMOGRAM WITH TOMOSYNTHESIS AND CAD TECHNIQUE: Bilateral screening digital craniocaudal and mediolateral oblique mammograms were obtained. Bilateral screening digital breast tomosynthesis was performed. The images were evaluated with computer-aided detection. COMPARISON:  Previous exam(s). ACR Breast Density Category c: The breast tissue is heterogeneously dense, which may obscure small masses. FINDINGS: There are no findings suspicious for malignancy. IMPRESSION: No mammographic evidence of malignancy. A result letter of this screening mammogram will be mailed directly to the patient. RECOMMENDATION: Screening mammogram in one year. (Code:SM-B-01Y) BI-RADS CATEGORY  1: Negative. Electronically Signed   By: Franki Cabot M.D.   On: 10/28/2021 09:11    Assessment & Plan:   Problem List Items Addressed This Visit     Anxiety - Primary    On Buspar po On Xanax prn  Potential benefits of a long term benzodiazepines  use as well as potential risks  and complications were explained to the patient and were aknowledged.      COPD mixed type ([place])    Cont on Symbicort Not smoking      Relevant Medications   budesonide-formoterol (SYMBICORT) 80-4.5 MCG/ACT inhaler   Grief    F-in-law died in 01-13-22 Discussed      Morbid obesity ([place])    Wt Readings from Last 3 Encounters:  12/29/21 214 lb 12.8 oz (97.4 kg)  10/13/21 212 lb 1.3 oz (96.2 kg)  10/11/21 210 lb (95.3 kg)           Meds ordered this encounter  Medications   budesonide-formoterol (SYMBICORT) 80-4.5 MCG/ACT inhaler    Sig: Inhale 2 puffs into the lungs 2 (two) times daily.    Dispense:  1 each    Refill:  11   triamcinolone ointment (KENALOG) 0.1 %    Sig: Apply 1 Application topically 3 (three)  times daily.    Dispense:  80 g    Refill:  2      Follow-up: Return in about 4 months (around 04/29/2022) for a follow-up visit.  Walker Kehr, MD

## 2021-12-29 NOTE — Assessment & Plan Note (Signed)
Wt Readings from Last 3 Encounters:  12/29/21 214 lb 12.8 oz (97.4 kg)  10/13/21 212 lb 1.3 oz (96.2 kg)  10/11/21 210 lb (95.3 kg)

## 2021-12-29 NOTE — Assessment & Plan Note (Signed)
On Buspar po On Xanax prn  Potential benefits of a long term benzodiazepines  use as well as potential risks  and complications were explained to the patient and were aknowledged.

## 2021-12-29 NOTE — Assessment & Plan Note (Addendum)
Cont on Symbicort Not smoking

## 2021-12-29 NOTE — Assessment & Plan Note (Signed)
F-in-law died in 12/2021 Discussed

## 2022-01-04 ENCOUNTER — Encounter: Payer: No Typology Code available for payment source | Admitting: Adult Health

## 2022-01-05 ENCOUNTER — Ambulatory Visit (INDEPENDENT_AMBULATORY_CARE_PROVIDER_SITE_OTHER): Payer: No Typology Code available for payment source

## 2022-01-05 ENCOUNTER — Encounter (HOSPITAL_COMMUNITY): Payer: Self-pay

## 2022-01-05 ENCOUNTER — Ambulatory Visit (HOSPITAL_COMMUNITY)
Admission: EM | Admit: 2022-01-05 | Discharge: 2022-01-05 | Disposition: A | Discharge status: Home / Self Care | Payer: No Typology Code available for payment source | Attending: Family Medicine | Admitting: Family Medicine

## 2022-01-05 DIAGNOSIS — R0600 Dyspnea, unspecified: Secondary | ICD-10-CM | POA: Diagnosis not present

## 2022-01-05 DIAGNOSIS — R509 Fever, unspecified: Secondary | ICD-10-CM

## 2022-01-05 DIAGNOSIS — J441 Chronic obstructive pulmonary disease with (acute) exacerbation: Secondary | ICD-10-CM | POA: Diagnosis not present

## 2022-01-05 LAB — POC INFLUENZA A AND B ANTIGEN (URGENT CARE ONLY)
INFLUENZA A ANTIGEN, POC: NEGATIVE
INFLUENZA B ANTIGEN, POC: NEGATIVE

## 2022-01-05 MED ORDER — AZITHROMYCIN 250 MG PO TABS: 250.0000 mg | ORAL_TABLET | Freq: Every day | ORAL | 0 refills | Status: DC

## 2022-01-05 MED ORDER — HYDROCODONE BIT-HOMATROP MBR 5-1.5 MG/5ML PO SOLN: 5.0000 mL | Freq: Four times a day (QID) | ORAL | 0 refills | Status: DC | PRN

## 2022-01-05 MED ORDER — PREDNISONE 20 MG PO TABS: 40.0000 mg | ORAL_TABLET | Freq: Every day | ORAL | 0 refills | Status: DC

## 2022-01-05 NOTE — ED Provider Notes (Signed)
[place]   [id number] 01/05/22 Arrival Time: 7209  ASSESSMENT & PLAN:  1. COPD exacerbation ([place])    I have personally viewed the imaging studies ordered this visit. No acute changes on CXR; no PNA.  POC influenza negative.  No resp distress. OTC symptom care as needed.  New Prescriptions   AZITHROMYCIN (ZITHROMAX) 250 MG TABLET    Take 1 tablet (250 mg total) by mouth daily. Take first 2 tablets together, then 1 every day until finished.   HYDROCODONE BIT-HOMATROPINE (HYCODAN) 5-1.5 MG/5ML SYRUP    Take 5 mLs by mouth every 6 (six) hours as needed for cough.   PREDNISONE (DELTASONE) 20 MG TABLET    Take 2 tablets (40 mg total) by mouth daily.     Follow-up Information     Plotnikov, Evie Lacks, MD.   Specialty: Internal Medicine Why: If worsening or failing to improve as anticipated. Contact information: [place] [address] [phone]                 Reviewed expectations re: course of current medical issues. Questions answered. Outlined signs and symptoms indicating need for more acute intervention. Understanding verbalized. After Visit Summary given.   SUBJECTIVE: History from: Patient. Marilyn [name] is a 66 y.o. female. Reports: nasal congestion, sore throat, fatigue, body aches, chill, headache, back pain. Abrupt onset approx 6 d ago. Cough is now productive. Questions wheezing.  Denies: fevers. Normal PO intake without n/v/d.  Social History   Tobacco Use  Smoking Status Former   Packs/day: 0.50   Types: Cigarettes   Quit date: 12/28/2020   Years since quitting: 1.0  Smokeless Tobacco Never  Tobacco Comments   STOPPED AND RESTARTED     OBJECTIVE:  Vitals:   01/05/22 1650  BP: 134/81  Pulse: 100  Resp: 16  Temp: 100 F (37.8 C)  TempSrc: Oral  SpO2: 93%    General appearance: alert; no distress but appears fatigued Eyes: PERRLA; EOMI; conjunctiva normal HENT: [place]; AT; with nasal  congestion Neck: supple  Lungs: speaks full sentences without difficulty; unlabored; bilat exp wheezing; active coughing Extremities: no edema Skin: warm and dry Neurologic: normal gait Psychological: alert and cooperative; normal mood and affect  Labs: Results for orders placed or performed during the hospital encounter of 01/05/22  POC Influenza A & B Ag (Urgent Care)  Result Value Ref Range   INFLUENZA A ANTIGEN, POC NEGATIVE NEGATIVE   INFLUENZA B ANTIGEN, POC NEGATIVE NEGATIVE   Labs Reviewed  POC INFLUENZA A AND B ANTIGEN (URGENT CARE ONLY)    Imaging: DG Chest 2 View  Result Date: 01/05/2022 CLINICAL DATA:  Dyspnea EXAM: CHEST - 2 VIEW COMPARISON:  03/10/2021 FINDINGS: Lungs are well expanded, symmetric, and clear. No pneumothorax or pleural effusion. Cardiac size within normal limits. Pulmonary vascularity is normal. Osseous structures are age-appropriate. No acute bone abnormality. Surgical clips seen within the a left breast, unchanged. IMPRESSION: 1. No active cardiopulmonary disease. Electronically Signed   By: Fidela Salisbury M.D.   On: 01/05/2022 17:26    Allergies  Allergen Reactions   Lexapro [Escitalopram Oxalate]     Felt like zombie   Microzide [Hydrochlorothiazide]     Palpitations   Penicillins Rash and Other (See Comments)    At injection site.    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 denies having GERD. Unable to remove it.   Headache(784.0)    History of anemia    History of bronchitis    History of sarcoidosis    Hyperlipidemia    no meds needed   Incisional breast wound    AT AGE 77 BILATERAL INCISION TO BREAST MADE   Leg swelling    Panic disorder    Personal history of radiation therapy    S/P radiation therapy 08/17/11 - 09/28/11   LLQ - 50 Gy/25 Fractions with Boost of 10 Gy / 5 fractions   Sialoadenitis of submandibular gland June 06, 2014   Vitamin D deficiency    Social History   Socioeconomic History   Marital status: Married    Spouse name: Not on file   Number of children: 2   Years of education: Not on file   Highest education level: Not on file  Occupational History   Occupation: retired  Tobacco Use   Smoking status: Former    Packs/day: 0.50    Types: Cigarettes    Quit date: 12/28/2020    Years since quitting: 1.0   Smokeless tobacco: Never   Tobacco comments:    STOPPED AND RESTARTED  Vaping Use   Vaping Use: Never used  Substance and Sexual Activity   Alcohol use: Yes    Alcohol/week: 0.0 - 1.0 standard drinks of alcohol    Comment: social   Drug use: No   Sexual activity: Not Currently    Birth control/protection: Surgical    Comment: menses age 6,hrt started  06-06-98  Other Topics Concern   Not on file  Social History Narrative   Widowed - husband died Jun 06, 2003   2 step-children   Exercises 3x per week   2 cups of caffeine daily         Social Determinants of Health   Financial Resource Strain: Medium Risk (05/18/2021)   Overall Financial Resource Strain (CARDIA)    Difficulty of Paying Living Expenses: Somewhat hard  Food Insecurity: No Food Insecurity (05/18/2021)   Hunger Vital Sign    Worried About Running Out of Food in the Last Year: Never true    Ran Out of Food in the Last Year: Never true   Transportation Needs: No Transportation Needs (05/18/2021)   PRAPARE - Transportation    Lack of Transportation (Medical): No    Lack of Transportation (Non-Medical): No  Physical Activity: Inactive (05/18/2021)   Exercise Vital Sign    Days of Exercise per Week: 0 days    Minutes of Exercise per Session: 0 min  Stress: Not on file  Social Connections: Not on file  Intimate Partner Violence: Not on file   Family History  Problem Relation Age of Onset   Dementia Mother  Hypertension Mother    Anemia Mother    Other Mother        + H Pylori   Seizures Mother    Hypertension Father    Prostate cancer Father        dx over 91   Colon cancer Father 66       diagnosed 2009   Bladder Cancer Father        dx over 79   Colon polyps Father    Hypertension Sister    Breast cancer Sister        dx in her 53s; mat half sister   Pancreatic cancer Sister 46       d. 54   Lung cancer Sister    Hypertension Sister    Sarcoidosis Sister        mat 1/2 sister   Multiple sclerosis Brother        #2   Hypertension Brother        #1   Hyperlipidemia Brother    Prostate cancer Brother        dx under 24   Dementia Maternal Aunt    Dementia Maternal Aunt    Seizures Maternal Uncle    Lung cancer Maternal Uncle    Lung cancer Paternal Aunt    Breast cancer Cousin        pat first cousin d. 85   Sarcoidosis Cousin        mat first cousin   Breast cancer Cousin        maternal 2nd cousins - distant   Brain cancer Other        benign   Esophageal cancer Neg Hx    Stomach cancer Neg Hx    Rectal cancer Neg Hx    Past Surgical History:  Procedure Laterality Date   BIOPSY BREAST     left breast  as a teenager  benign   BREAST BIOPSY Left 06/06/2011   BREAST EXCISIONAL BIOPSY Bilateral    BREAST LUMPECTOMY Left 2013   BREAST SURGERY  07/05/11   left breast lumpectomy with needle loc & axillary sln bx   BUNIONECTOMY  2011   bilateral by Dr Little Ishikawa   CHOLECYSTECTOMY  02/2019    COLONOSCOPY     polyp   cyst removed from right hand  1995   POLYPECTOMY     TONSILLECTOMY  1972   TOTAL ABDOMINAL HYSTERECTOMY  2000   Dr Gae Gallop FINGER RELEASE  2008   Dr Donnal Debar, MD 01/05/22 [place]

## 2022-01-05 NOTE — Discharge Instructions (Signed)
Be aware, your cough medication may cause drowsiness. Please do not drive, operate heavy machinery, or make important decisions while on this medication as your judgement may be clouded.

## 2022-01-05 NOTE — ED Triage Notes (Signed)
Pt is here for nasal congestion, sore throat, fatigue,body aches, chill, headache , back pain

## 2022-01-11 ENCOUNTER — Ambulatory Visit: Payer: No Typology Code available for payment source | Admitting: Podiatry

## 2022-01-11 DIAGNOSIS — M216X1 Other acquired deformities of right foot: Secondary | ICD-10-CM | POA: Diagnosis not present

## 2022-01-11 NOTE — Progress Notes (Signed)
This patient presents to the office saying she is experiencing pain on the outside of her right foot.  She says the callus continually returns.  She presents to the office for evaluation and treatment.  Vascular  Dorsalis pedis and posterior tibial pulses are palpable  B/L.  Capillary return  WNL.  Temperature gradient is  WNL.  Skin turgor  WNL  Sensorium  Senn Weinstein monofilament wire  WNL. Normal tactile sensation.  Nail Exam  Patient has normal nails with no evidence of bacterial or fungal infection.  Orthopedic  Exam  Muscle tone and muscle strength  WNL.  No limitations of motion feet  B/L.  No crepitus or joint effusion noted.  Foot type is unremarkable and digits show no abnormalities. Prominent metatarsal fifth met right foot.  Skin  No open lesions.  Normal skin texture and turgor.   Plantar flexed fifth metatarsal right foot.  return office visit   Discussed condition with patient.  To refer to Dr.  Paulla Dolly for surgical consult   RTC prn  Gardiner Barefoot DPM

## 2022-01-13 ENCOUNTER — Inpatient Hospital Stay: Payer: No Typology Code available for payment source | Attending: Adult Health | Admitting: Adult Health

## 2022-01-13 ENCOUNTER — Encounter: Payer: Self-pay | Admitting: Adult Health

## 2022-01-13 VITALS — BP 118/65 | HR 74 | Temp 97.2°F | Resp 16 | Wt 212.7 lb

## 2022-01-13 DIAGNOSIS — Z9049 Acquired absence of other specified parts of digestive tract: Secondary | ICD-10-CM | POA: Insufficient documentation

## 2022-01-13 DIAGNOSIS — Z8052 Family history of malignant neoplasm of bladder: Secondary | ICD-10-CM | POA: Insufficient documentation

## 2022-01-13 DIAGNOSIS — Z923 Personal history of irradiation: Secondary | ICD-10-CM | POA: Diagnosis not present

## 2022-01-13 DIAGNOSIS — Z801 Family history of malignant neoplasm of trachea, bronchus and lung: Secondary | ICD-10-CM | POA: Diagnosis not present

## 2022-01-13 DIAGNOSIS — Z83711 Family history of hyperplastic colon polyps: Secondary | ICD-10-CM | POA: Insufficient documentation

## 2022-01-13 DIAGNOSIS — D869 Sarcoidosis, unspecified: Secondary | ICD-10-CM | POA: Insufficient documentation

## 2022-01-13 DIAGNOSIS — I2510 Atherosclerotic heart disease of native coronary artery without angina pectoris: Secondary | ICD-10-CM | POA: Diagnosis not present

## 2022-01-13 DIAGNOSIS — Z800 Family history of malignant neoplasm of digestive organs: Secondary | ICD-10-CM | POA: Insufficient documentation

## 2022-01-13 DIAGNOSIS — Z820 Family history of epilepsy and other diseases of the nervous system: Secondary | ICD-10-CM | POA: Insufficient documentation

## 2022-01-13 DIAGNOSIS — Z8042 Family history of malignant neoplasm of prostate: Secondary | ICD-10-CM | POA: Diagnosis not present

## 2022-01-13 DIAGNOSIS — Z803 Family history of malignant neoplasm of breast: Secondary | ICD-10-CM | POA: Insufficient documentation

## 2022-01-13 DIAGNOSIS — Z79899 Other long term (current) drug therapy: Secondary | ICD-10-CM | POA: Insufficient documentation

## 2022-01-13 DIAGNOSIS — Z5986 Financial insecurity: Secondary | ICD-10-CM | POA: Diagnosis not present

## 2022-01-13 DIAGNOSIS — Z8269 Family history of other diseases of the musculoskeletal system and connective tissue: Secondary | ICD-10-CM | POA: Insufficient documentation

## 2022-01-13 DIAGNOSIS — Z8249 Family history of ischemic heart disease and other diseases of the circulatory system: Secondary | ICD-10-CM | POA: Insufficient documentation

## 2022-01-13 DIAGNOSIS — M7989 Other specified soft tissue disorders: Secondary | ICD-10-CM | POA: Insufficient documentation

## 2022-01-13 DIAGNOSIS — Z818 Family history of other mental and behavioral disorders: Secondary | ICD-10-CM | POA: Diagnosis not present

## 2022-01-13 DIAGNOSIS — Z170 Estrogen receptor positive status [ER+]: Secondary | ICD-10-CM | POA: Diagnosis not present

## 2022-01-13 DIAGNOSIS — Z79811 Long term (current) use of aromatase inhibitors: Secondary | ICD-10-CM | POA: Insufficient documentation

## 2022-01-13 DIAGNOSIS — Z8719 Personal history of other diseases of the digestive system: Secondary | ICD-10-CM | POA: Insufficient documentation

## 2022-01-13 DIAGNOSIS — Z8349 Family history of other endocrine, nutritional and metabolic diseases: Secondary | ICD-10-CM | POA: Insufficient documentation

## 2022-01-13 DIAGNOSIS — Z808 Family history of malignant neoplasm of other organs or systems: Secondary | ICD-10-CM | POA: Insufficient documentation

## 2022-01-13 DIAGNOSIS — I700 Atherosclerosis of aorta: Secondary | ICD-10-CM | POA: Diagnosis not present

## 2022-01-13 DIAGNOSIS — C50412 Malignant neoplasm of upper-outer quadrant of left female breast: Secondary | ICD-10-CM | POA: Diagnosis not present

## 2022-01-13 DIAGNOSIS — E785 Hyperlipidemia, unspecified: Secondary | ICD-10-CM | POA: Diagnosis not present

## 2022-01-13 DIAGNOSIS — Z87891 Personal history of nicotine dependence: Secondary | ICD-10-CM | POA: Diagnosis not present

## 2022-01-13 DIAGNOSIS — Z832 Family history of diseases of the blood and blood-forming organs and certain disorders involving the immune mechanism: Secondary | ICD-10-CM | POA: Insufficient documentation

## 2022-01-13 DIAGNOSIS — Z880 Allergy status to penicillin: Secondary | ICD-10-CM | POA: Diagnosis not present

## 2022-01-13 NOTE — Progress Notes (Unsigned)
[facility] Follow up:    Marilyn [name], Marilyn Lacks, MD [place] [address]   DIAGNOSIS: Cancer Staging  Malignant neoplasm of upper-outer quadrant of left breast in female, estrogen receptor positive ([place]) Staging form: Breast, AJCC 7th Edition - Clinical: Stage IA (T1c, N0, cM0) - Unsigned Laterality: Left Staging comments: Staged In Breast Conference  4.17.13  - Pathologic: No stage assigned - Unsigned Laterality: Left   SUMMARY OF ONCOLOGIC HISTORY: Oncology History Overview Note  ATM and MUTYH VUS.  Otherwise negative genetic testing   Malignant neoplasm of upper-outer quadrant of left breast in female, estrogen receptor positive ([place])  06/20/2011 Surgery   Left breast lumpectomy with sentinel lymph node biopsy: 1 cm IDC, grade 1, ER positive, PR positive, HER-2 negative, Ki-67 6%   08/17/2011 - 09/28/2011 Radiation Therapy   Adjuvant radiation therapy   10/02/2011 - 11/23/2016 Anti-estrogen oral therapy   Arimidex 1 mg daily started 10/02/2011 stopped March 2015 for pain and restarted August 2015   12/21/2017 Genetic Testing   ATM c.3191T>C and MUTYH c.920G>A VUS identified on the common hereditary cancer panel.  The Hereditary Gene Panel offered by [facility] includes sequencing and/or deletion duplication testing of the following 47 genes: APC, ATM, AXIN2, BARD1, BMPR1A, BRCA1, BRCA2, BRIP1, CDH1, CDK4, CDKN2A (p14ARF), CDKN2A (p16INK4a), CHEK2, CTNNA1, DICER1, EPCAM (Deletion/duplication testing only), GREM1 (promoter region deletion/duplication testing only), KIT, MEN1, MLH1, MSH2, MSH3, MSH6, MUTYH, NBN, NF1, NHTL1, PALB2, PDGFRA, PMS2, POLD1, POLE, PTEN, RAD50, RAD51C, RAD51D, SDHB, SDHC, SDHD, SMAD4, SMARCA4. STK11, TP53, TSC1, TSC2, and VHL.  The following genes were evaluated for sequence changes only: SDHA and HOXB13 c.251G>A variant only. The report date is 12/21/2017.     CURRENT THERAPY:  INTERVAL HISTORY: Marilyn [name]  66 y.o. female returns for follow-up of her history of breast cancer.  Her most recent mammogram was completed on October 28, 2021 and showed no mammographic evidence of malignancy and breast density category C.   Patient Active Problem List   Diagnosis Date Noted   Plantar flexed metatarsal bone of right foot 01/11/2022   Grief 09/28/2021   Leg swelling 04/18/2021   Incisional breast wound 04/18/2021   History of sarcoidosis 04/18/2021   History of bronchitis 04/18/2021   GERD (gastroesophageal reflux disease) 04/18/2021   Family history of prostate cancer 04/18/2021   Depression 63/87/5643   Complication of anesthesia 04/18/2021   Colon polyp 04/18/2021   Cigarette smoker 04/18/2021   Cancer ([place]) 04/18/2021   Coronary atherosclerosis 01/30/2021   Atherosclerosis of aorta ([place]) 01/30/2021   Primary hypertension 01/14/2021   COPD mixed type ([place]) 09/30/2019   Knee pain, left 08/28/2019   Elevated BP without diagnosis of hypertension 08/28/2019   Callus of foot 08/28/2019   Cholelithiasis 12/26/2018   Genetic testing 12/26/2017   Family history of breast cancer    Family history of pancreatic cancer    Family history of colon cancer    Abdominal pain 07/28/2016   Tobacco use disorder 05/08/2016   Morbid obesity ([place]) 03/13/2016   Dyspnea 11/09/2015   Well adult exam 07/23/2014   Sialoadenitis 06/02/2014   S/P radiation therapy 2016   Edema 07/07/2013   AB (asthmatic bronchitis) 06/23/2013   Anxiety    Malignant neoplasm of upper-outer quadrant of left breast in female, estrogen receptor positive ([place]) 06/09/2011   Allergy 06/06/2011   COLONIC POLYPS 01/25/2010   DIVERTICULOSIS OF COLON 01/25/2010   DEGENERATIVE JOINT DISEASE 11/06/2008   PANIC DISORDER 07/23/2008  Sarcoidosis 10/13/2007   Vitamin D deficiency 10/13/2007   ANEMIA 10/13/2007   BRONCHITIS 10/13/2007   Dyslipidemia 04/23/2007   Adjustment disorder with mixed anxiety and depressed mood 04/23/2007    Venous (peripheral) insufficiency 04/23/2007   HEADACHE 04/23/2007    is allergic to lexapro [escitalopram oxalate], microzide [hydrochlorothiazide], and penicillins.  MEDICAL HISTORY: Past Medical History:  Diagnosis Date   Allergy    Anemia    hx   Anxiety    panic disorder   Breast cancer ([place] [facility]) 06/06/2011   bc  left breast 3 o'clock dx=invasive ductal ca uoqER/PR=positive   Cancer (HCC)    BREAST - left   Cigarette smoker    Colon polyp    Complication of anesthesia    DIFFICULTY AWAKENING, BP INCREASE AFTER CHOLECYSTECTEMY    COPD (chronic obstructive pulmonary disease) (HCC)    Depression    Diverticulosis of colon    DJD (degenerative joint disease)    DJD (degenerative joint disease)    Family history of breast cancer    Family history of colon cancer    Family history of pancreatic cancer    Family history of prostate cancer    Gallstones    GERD (gastroesophageal reflux disease)    Pt. denies having GERD. Unable to remove it.   Headache(784.0)    History of anemia    History of bronchitis    History of sarcoidosis    Hyperlipidemia    no meds needed   Incisional breast wound    AT AGE 56 BILATERAL INCISION TO BREAST MADE   Leg swelling    Panic disorder    Personal history of radiation therapy    S/P radiation therapy 08/17/11 - 09/28/11   LLQ - 50 Gy/25 Fractions with Boost of 10 Gy / 5 fractions   Sialoadenitis of submandibular gland 2016   Vitamin D deficiency     SURGICAL HISTORY: Past Surgical History:  Procedure Laterality Date   BIOPSY BREAST     left breast  as a teenager  benign   BREAST BIOPSY Left 06/06/2011   BREAST EXCISIONAL BIOPSY Bilateral    BREAST LUMPECTOMY Left 2013   BREAST SURGERY  07/05/11   left breast lumpectomy with needle loc & axillary sln bx   BUNIONECTOMY  2011   bilateral by Dr Little Ishikawa   CHOLECYSTECTOMY  02/2019   COLONOSCOPY     polyp   cyst removed from right hand  [place]  2000   Dr Gae Gallop FINGER RELEASE  2008   Dr Burney Gauze    SOCIAL HISTORY: Social History   Socioeconomic History   Marital status: Married    Spouse name: Not on file   Number of children: 2   Years of education: Not on file   Highest education level: Not on file  Occupational History   Occupation: retired  Tobacco Use   Smoking status: Former    Packs/day: 0.50    Types: Cigarettes    Quit date: 12/28/2020    Years since quitting: 1.0   Smokeless tobacco: Never   Tobacco comments:    STOPPED AND RESTARTED  Vaping Use   Vaping Use: Never used  Substance and Sexual Activity   Alcohol use: Yes    Alcohol/week: 0.0 - 1.0 standard drinks of alcohol    Comment: social   Drug use: No   Sexual activity: Not Currently  Birth control/protection: Surgical    Comment: menses age 15,hrt started  04-22-1998  Other Topics Concern   Not on file  Social History Narrative   Widowed - husband died 2003/04/23   2 step-children   Exercises 3x per week   2 cups of caffeine daily         Social Determinants of Health   Financial Resource Strain: Medium Risk (05/18/2021)   Overall Financial Resource Strain (CARDIA)    Difficulty of Paying Living Expenses: Somewhat hard  Food Insecurity: No Food Insecurity (05/18/2021)   Hunger Vital Sign    Worried About Running Out of Food in the Last Year: Never true    Ran Out of Food in the Last Year: Never true  Transportation Needs: No Transportation Needs (05/18/2021)   PRAPARE - Hydrologist (Medical): No    Lack of Transportation (Non-Medical): No  Physical Activity: Inactive (05/18/2021)   Exercise Vital Sign    Days of Exercise per Week: 0 days    Minutes of Exercise per Session: 0 min  Stress: Not on file  Social Connections: Not on file  Intimate Partner Violence: Not on file    FAMILY HISTORY: Family History  Problem Relation Age of Onset   Dementia Mother    Hypertension  Mother    Anemia Mother    Other Mother        + H Pylori   Seizures Mother    Hypertension Father    Prostate cancer Father        dx over 42   Colon cancer Father 40       diagnosed 04/23/2007   Bladder Cancer Father        dx over 20   Colon polyps Father    Hypertension Sister    Breast cancer Sister        dx in her 48s; mat half sister   Pancreatic cancer Sister 60       d. 75   Lung cancer Sister    Hypertension Sister    Sarcoidosis Sister        mat 1/2 sister   Multiple sclerosis Brother        #2   Hypertension Brother        #1   Hyperlipidemia Brother    Prostate cancer Brother        dx under 82   Dementia Maternal Aunt    Dementia Maternal Aunt    Seizures Maternal Uncle    Lung cancer Maternal Uncle    Lung cancer Paternal Aunt    Breast cancer Cousin        pat first cousin d. 84   Sarcoidosis Cousin        mat first cousin   Breast cancer Cousin        maternal 2nd cousins - distant   Brain cancer Other        benign   Esophageal cancer Neg Hx    Stomach cancer Neg Hx    Rectal cancer Neg Hx     Review of Systems - Oncology    PHYSICAL EXAMINATION  ECOG PERFORMANCE STATUS: {CHL ONC ECOG RF:[id number]}  Vitals:   01/13/22 1142  BP: 118/65  Pulse: 74  Resp: 16  Temp: (!) 97.2 F (36.2 C)  SpO2: 99%    Physical Exam  LABORATORY DATA:  CBC    Component Value Date/Time   WBC 7.6 03/10/2021 1101   RBC 4.93  03/10/2021 1101   HGB 11.7 (L) 03/10/2021 1101   HGB 12.2 11/04/2013 0928   HCT 37.7 03/10/2021 1101   HCT 38.9 11/04/2013 0928   PLT 244 03/10/2021 1101   PLT 247 11/04/2013 0928   MCV 76.5 (L) 03/10/2021 1101   MCV 74.7 (L) 11/04/2013 0928   MCH 23.7 (L) 03/10/2021 1101   MCHC 31.0 03/10/2021 1101   RDW 15.9 (H) 03/10/2021 1101   RDW 14.7 (H) 11/04/2013 0928   LYMPHSABS 1.5 03/10/2021 1101   LYMPHSABS 1.8 11/04/2013 0928   MONOABS 0.6 03/10/2021 1101   MONOABS 0.5 11/04/2013 0928   EOSABS 0.3 03/10/2021 1101   EOSABS  0.2 11/04/2013 0928   BASOSABS 0.0 03/10/2021 1101   BASOSABS 0.0 11/04/2013 0928    CMP     Component Value Date/Time   NA 138 03/10/2021 1101   NA 138 11/04/2013 0929   K 3.7 03/10/2021 1101   K 3.8 11/04/2013 0929   CL 109 03/10/2021 1101   CL 107 03/22/2012 0853   CO2 22 03/10/2021 1101   CO2 23 11/04/2013 0929   GLUCOSE 102 (H) 03/10/2021 1101   GLUCOSE 100 11/04/2013 0929   GLUCOSE 99 03/22/2012 0853   BUN 10 03/10/2021 1101   BUN 12.1 11/04/2013 0929   CREATININE 0.70 03/10/2021 1101   CREATININE 0.8 11/04/2013 0929   CALCIUM 9.2 03/10/2021 1101   CALCIUM 9.6 11/04/2013 0929   PROT 7.0 12/28/2020 1138   PROT 7.2 11/04/2013 0929   ALBUMIN 4.1 12/28/2020 1138   ALBUMIN 3.6 11/04/2013 0929   AST 14 12/28/2020 1138   AST 12 11/04/2013 0929   ALT 11 12/28/2020 1138   ALT 13 11/04/2013 0929   ALKPHOS 74 12/28/2020 1138   ALKPHOS 108 11/04/2013 0929   BILITOT 0.4 12/28/2020 1138   BILITOT 0.28 11/04/2013 0929   GFRNONAA >60 03/10/2021 1101   GFRAA >60 07/14/2016 2305       PENDING LABS:   RADIOGRAPHIC STUDIES:  No results found.   PATHOLOGY:     ASSESSMENT and THERAPY PLAN:   No problem-specific Assessment & Plan notes found for this encounter.   No orders of the defined types were placed in this encounter.   All questions were answered. The patient knows to call the clinic with any problems, questions or concerns. We can certainly see the patient much sooner if necessary. This note was electronically signed. Scot Dock, NP 01/13/2022

## 2022-01-15 NOTE — Assessment & Plan Note (Addendum)
Marilyn [name] is a 66 year old with h/o stage IA left breast cancer diagnosed in 05/2011 s/p lumpectomy, adjuvant radiation therapy, and antiestrogen therapy x 5 years completed in 10/2016.    Marilyn [name] has no clinical or radiographic sign of breast cancer recurrence.  I recommended she continue annual mammograms next due in 10/2022.  I recommended that she continue to f/u with her PCP and stay up to date with her cancer screenings.  We discussed healthy diet and exercise.    We will see Marilyn [name] in one year for continued long term surveillance.

## 2022-01-16 ENCOUNTER — Telehealth: Payer: Self-pay | Admitting: Adult Health

## 2022-01-16 NOTE — Telephone Encounter (Signed)
Scheduled appointment per 11/17 los. Patient is aware.

## 2022-01-25 ENCOUNTER — Encounter: Payer: Self-pay | Admitting: Podiatry

## 2022-01-25 ENCOUNTER — Ambulatory Visit (INDEPENDENT_AMBULATORY_CARE_PROVIDER_SITE_OTHER): Payer: No Typology Code available for payment source

## 2022-01-25 ENCOUNTER — Ambulatory Visit (INDEPENDENT_AMBULATORY_CARE_PROVIDER_SITE_OTHER): Payer: No Typology Code available for payment source | Admitting: Podiatry

## 2022-01-25 ENCOUNTER — Other Ambulatory Visit: Payer: No Typology Code available for payment source

## 2022-01-25 DIAGNOSIS — M216X1 Other acquired deformities of right foot: Secondary | ICD-10-CM

## 2022-01-25 DIAGNOSIS — M21621 Bunionette of right foot: Secondary | ICD-10-CM

## 2022-01-25 NOTE — Progress Notes (Signed)
Subjective:   Patient ID: Marilyn [name], female   DOB: 66 y.o.   MRN: [id number]   HPI Patient presents with chronic painful lesion fifth metatarsal right that is been worked on in the past and recently and its come back already.  States that she only gets temporary relief and she is interested in a long-term solution with history of having bunions fixed by our practice.  Patient does not smoke likes to be active   Review of Systems  All other systems reviewed and are negative.       Objective:  Physical Exam Vitals and nursing note reviewed.  Constitutional:      Appearance: She is well-developed.  Pulmonary:     Effort: Pulmonary effort is normal.  Musculoskeletal:        General: Normal range of motion.  Skin:    General: Skin is warm.  Neurological:     Mental Status: She is alert.     Neurovascular status intact muscle strength adequate range of motion within normal limits with patient found to have inflammation pain with keratotic lesion painful when pressed fifth metatarsal head right with fluid buildup around the area and prominent fifth metatarsal head     Assessment:  Chronic lesion fifth metatarsal head right with pain with failure of conservative care consisting of debridement padding offloading weight over an extensive period of time     Plan:  H&P reviewed and I do recommend fifth metatarsal head resection given the long-term nature of prominent pain she is in.  She wants surgery and I reviewed x-rays with her and at this point I allowed her to read and signed consent form going over all possible complications and recovery.  She wants surgery signed consent form and at this point I did dispense air fracture walker properly fitted to her lower leg that I want her to wear during the postoperative.  And discussed with her surgery.  Patient is scheduled for outpatient surgery all questions answered today and will be seen back to have this done at the surgical  center  X-rays indicate that there is moderate enlargement with discomfort around the fifth metatarsal head right foot no arthritis no stress fracture

## 2022-01-26 ENCOUNTER — Telehealth: Payer: Self-pay | Admitting: Podiatry

## 2022-01-26 NOTE — Telephone Encounter (Addendum)
DOS: 02/14/2022  [employer] GEHA Effective 02/27/2021  Metatarsal Head Resection 5th Rt (71278)  Deductible: $350 with $0 remaining Out-of-Pocket: $6,500 with $1,545.99 remaining CoInsurance: 15%  Prior authorization is not required per Joy R.  Call Reference #: [id number]

## 2022-01-27 ENCOUNTER — Ambulatory Visit: Payer: No Typology Code available for payment source | Admitting: Orthopaedic Surgery

## 2022-01-27 ENCOUNTER — Ambulatory Visit (INDEPENDENT_AMBULATORY_CARE_PROVIDER_SITE_OTHER): Payer: No Typology Code available for payment source

## 2022-01-27 DIAGNOSIS — M25551 Pain in right hip: Secondary | ICD-10-CM | POA: Diagnosis not present

## 2022-01-27 NOTE — Progress Notes (Unsigned)
Office Visit Note   Patient: Marilyn [name]           Date of Birth: 03-15-1955           MRN: [id number] Visit Date: 01/27/2022              Requested by: Cassandria Anger, MD [place],  [place] [address] PCP: Plotnikov, Evie Lacks, MD   Assessment & Plan: Visit Diagnoses:  1. Right hip pain     Plan: Impression is lateral right hip pain.  We discussed the possible conditions that can cause her symptoms.  We also talked about treatment options including weight loss, physical therapy, cortisone injections.  Based on her options she would like to try a course of outpatient PT and she will make efforts at weight loss.  She will follow-up if symptoms do not improve.  Follow-Up Instructions: No follow-ups on file.   Orders:  Orders Placed This Encounter  Procedures   XR Lumbar Spine 2-3 Views   Ambulatory referral to Physical Therapy   No orders of the defined types were placed in this encounter.     Procedures: No procedures performed   Clinical Data: No additional findings.   Subjective: Chief Complaint  Patient presents with   Right Hip - Pain    HPI  Marilyn [name] comes in today for evaluation of right lateral hip pain that radiates down the leg to the knee.  Has a history of cancer.  The pain is worse when sleeping on the right side and when she stands a lot.  Denies any radicular symptoms.  Review of Systems  Constitutional: Negative.   HENT: Negative.    Eyes: Negative.   Respiratory: Negative.    Cardiovascular: Negative.   Endocrine: Negative.   Musculoskeletal: Negative.   Neurological: Negative.   Hematological: Negative.   Psychiatric/Behavioral: Negative.    All other systems reviewed and are negative.    Objective: Vital Signs: There were no vitals taken for this visit.  Physical Exam Vitals and nursing note reviewed.  Constitutional:      Appearance: She is well-developed.  HENT:     Head: Atraumatic.     Nose:  Nose normal.  Eyes:     Extraocular Movements: Extraocular movements intact.  Cardiovascular:     Pulses: Normal pulses.  Pulmonary:     Effort: Pulmonary effort is normal.  Abdominal:     Palpations: Abdomen is soft.  Musculoskeletal:     Cervical back: Neck supple.  Skin:    General: Skin is warm.     Capillary Refill: Capillary refill takes less than 2 seconds.  Neurological:     Mental Status: She is alert. Mental status is at baseline.  Psychiatric:        Behavior: Behavior normal.        Thought Content: Thought content normal.        Judgment: Judgment normal.     Ortho Exam Examination of the right hip shows tenderness to the trochanter.  She has decent range of motion without any significant pain that localizes to the deep hip or the groin region.  No sciatic tension signs. Specialty Comments:  No specialty comments available.  Imaging: No results found.   PMFS History: Patient Active Problem List   Diagnosis Date Noted   Plantar flexed metatarsal bone of right foot 01/11/2022   Grief 09/28/2021   Leg swelling 04/18/2021   Incisional breast wound 04/18/2021   History of  sarcoidosis 04/18/2021   History of bronchitis 04/18/2021   GERD (gastroesophageal reflux disease) 04/18/2021   Family history of prostate cancer 04/18/2021   Depression 93/23/5573   Complication of anesthesia 04/18/2021   Colon polyp 04/18/2021   Cigarette smoker 04/18/2021   Cancer ([facility]) 04/18/2021   Coronary atherosclerosis 01/30/2021   Atherosclerosis of aorta ([place]) 01/30/2021   Primary hypertension 01/14/2021   COPD mixed type ([place]) 09/30/2019   Knee pain, left 08/28/2019   Elevated BP without diagnosis of hypertension 08/28/2019   Callus of foot 08/28/2019   Cholelithiasis 12/26/2018   Genetic testing 12/26/2017   Family history of breast cancer    Family history of pancreatic cancer    Family history of colon cancer    Abdominal pain 07/28/2016   Tobacco use disorder  05/08/2016   Morbid obesity ([place]) 03/13/2016   Dyspnea 11/09/2015   Well adult exam 07/23/2014   Sialoadenitis 06/02/2014   S/P radiation therapy 2016   Edema 07/07/2013   AB (asthmatic bronchitis) 06/23/2013   Anxiety    Malignant neoplasm of upper-outer quadrant of left breast in female, estrogen receptor positive ([place]) 06/09/2011   Allergy 06/06/2011   COLONIC POLYPS 01/25/2010   DIVERTICULOSIS OF COLON 01/25/2010   DEGENERATIVE JOINT DISEASE 11/06/2008   PANIC DISORDER 07/23/2008   Sarcoidosis 10/13/2007   Vitamin D deficiency 10/13/2007   ANEMIA 10/13/2007   BRONCHITIS 10/13/2007   Dyslipidemia 04/23/2007   Adjustment disorder with mixed anxiety and depressed mood 04/23/2007   Venous (peripheral) insufficiency 04/23/2007   HEADACHE 04/23/2007   Past Medical History:  Diagnosis Date   Allergy    Anemia    hx   Anxiety    panic disorder   Breast cancer ([place]) 06/06/2011   bc  left breast 3 o'clock dx=invasive ductal ca uoqER/PR=positive   Cancer ([facility])    BREAST - left   Cigarette smoker    Colon polyp    Complication of anesthesia    DIFFICULTY AWAKENING, BP INCREASE AFTER CHOLECYSTECTEMY    COPD (chronic obstructive pulmonary disease) ([facility])    Depression    Diverticulosis of colon    DJD (degenerative joint disease)    DJD (degenerative joint disease)    Family history of breast cancer    Family history of colon cancer    Family history of pancreatic cancer    Family history of prostate cancer    Gallstones    GERD (gastroesophageal reflux disease)    Pt. denies having GERD. Unable to remove it.   Headache(784.0)    History of anemia    History of bronchitis    History of sarcoidosis    Hyperlipidemia    no meds needed   Incisional breast wound    AT AGE 15 BILATERAL INCISION TO BREAST MADE   Leg swelling    Panic disorder    Personal history of radiation therapy    S/P radiation therapy 08/17/11 - 09/28/11   LLQ - 50 Gy/25 Fractions with Boost of 10 Gy /  5 fractions   Sialoadenitis of submandibular gland 2016   Vitamin D deficiency     Family History  Problem Relation Age of Onset   Dementia Mother    Hypertension Mother    Anemia Mother    Other Mother        + H Pylori   Seizures Mother    Hypertension Father    Prostate cancer Father        dx over 30   Colon  cancer Father 83       diagnosed 2009   Bladder Cancer Father        dx over 82   Colon polyps Father    Hypertension Sister    Breast cancer Sister        dx in her 17s; mat half sister   Pancreatic cancer Sister 3       d. 29   Lung cancer Sister    Hypertension Sister    Sarcoidosis Sister        mat 1/2 sister   Multiple sclerosis Brother        #2   Hypertension Brother        #1   Hyperlipidemia Brother    Prostate cancer Brother        dx under 42   Dementia Maternal Aunt    Dementia Maternal Aunt    Seizures Maternal Uncle    Lung cancer Maternal Uncle    Lung cancer Paternal Aunt    Breast cancer Cousin        pat first cousin d. 32   Sarcoidosis Cousin        mat first cousin   Breast cancer Cousin        maternal 2nd cousins - distant   Brain cancer Other        benign   Esophageal cancer Neg Hx    Stomach cancer Neg Hx    Rectal cancer Neg Hx     Past Surgical History:  Procedure Laterality Date   BIOPSY BREAST     left breast  as a teenager  benign   BREAST BIOPSY Left 06/06/2011   BREAST EXCISIONAL BIOPSY Bilateral    BREAST LUMPECTOMY Left 2013   BREAST SURGERY  07/05/11   left breast lumpectomy with needle loc & axillary sln bx   BUNIONECTOMY  2011   bilateral by Dr Little Ishikawa   CHOLECYSTECTOMY  02/2019   COLONOSCOPY     polyp   cyst removed from right hand  [place]  2000   Dr Gae Gallop FINGER RELEASE  2008   Dr Burney Gauze   Social History   Occupational History   Occupation: retired  Tobacco Use   Smoking status: Former    Packs/day: 0.50     Types: Cigarettes    Quit date: 12/28/2020    Years since quitting: 1.0   Smokeless tobacco: Never   Tobacco comments:    STOPPED AND RESTARTED  Vaping Use   Vaping Use: Never used  Substance and Sexual Activity   Alcohol use: Yes    Alcohol/week: 0.0 - 1.0 standard drinks of alcohol    Comment: social   Drug use: No   Sexual activity: Not Currently    Birth control/protection: Surgical    Comment: menses age 22,hrt started  2000

## 2022-02-07 ENCOUNTER — Other Ambulatory Visit: Payer: Self-pay

## 2022-02-07 ENCOUNTER — Ambulatory Visit: Payer: No Typology Code available for payment source | Admitting: Physical Therapy

## 2022-02-07 DIAGNOSIS — R262 Difficulty in walking, not elsewhere classified: Secondary | ICD-10-CM | POA: Diagnosis not present

## 2022-02-07 DIAGNOSIS — M25551 Pain in right hip: Secondary | ICD-10-CM

## 2022-02-07 DIAGNOSIS — M25562 Pain in left knee: Secondary | ICD-10-CM

## 2022-02-07 DIAGNOSIS — G8929 Other chronic pain: Secondary | ICD-10-CM

## 2022-02-07 DIAGNOSIS — R600 Localized edema: Secondary | ICD-10-CM | POA: Diagnosis not present

## 2022-02-07 DIAGNOSIS — M6281 Muscle weakness (generalized): Secondary | ICD-10-CM

## 2022-02-07 NOTE — Therapy (Signed)
OUTPATIENT PHYSICAL THERAPY LOWER EXTREMITY EVALUATION   Patient Name: Marilyn [name] MRN: [id number] DOB:1955/06/08, 66 y.o., female Today's Date: 02/07/2022  END OF SESSION:   Past Medical History:  Diagnosis Date   Allergy    Anemia    hx   Anxiety    panic disorder   Breast cancer ([place]) 06/06/2011   bc  left breast 3 o'clock dx=invasive ductal ca uoqER/PR=positive   Cancer (HCC)    BREAST - left   Cigarette smoker    Colon polyp    Complication of anesthesia    DIFFICULTY AWAKENING, BP INCREASE AFTER CHOLECYSTECTEMY    COPD (chronic obstructive pulmonary disease) (HCC)    Depression    Diverticulosis of colon    DJD (degenerative joint disease)    DJD (degenerative joint disease)    Family history of breast cancer    Family history of colon cancer    Family history of pancreatic cancer    Family history of prostate cancer    Gallstones    GERD (gastroesophageal reflux disease)    Pt. denies having GERD. Unable to remove it.   Headache(784.0)    History of anemia    History of bronchitis    History of sarcoidosis    Hyperlipidemia    no meds needed   Incisional breast wound    AT AGE 20 BILATERAL INCISION TO BREAST MADE   Leg swelling    Panic disorder    Personal history of radiation therapy    S/P radiation therapy 08/17/11 - 09/28/11   LLQ - 50 Gy/25 Fractions with Boost of 10 Gy / 5 fractions   Sialoadenitis of submandibular gland 2016   Vitamin D deficiency    Past Surgical History:  Procedure Laterality Date   BIOPSY BREAST     left breast  as a teenager  benign   BREAST BIOPSY Left 06/06/2011   BREAST EXCISIONAL BIOPSY Bilateral    BREAST LUMPECTOMY Left 2013   BREAST SURGERY  07/05/11   left breast lumpectomy with needle loc & axillary sln bx   BUNIONECTOMY  2011   bilateral by Dr Little Ishikawa   CHOLECYSTECTOMY  02/2019   COLONOSCOPY     polyp   cyst removed from right hand  [place]   Dr Gae Gallop FINGER RELEASE  2008   Dr Burney Gauze   Patient Active Problem List   Diagnosis Date Noted   Plantar flexed metatarsal bone of right foot 01/11/2022   Grief 09/28/2021   Leg swelling 04/18/2021   Incisional breast wound 04/18/2021   History of sarcoidosis 04/18/2021   History of bronchitis 04/18/2021   GERD (gastroesophageal reflux disease) 04/18/2021   Family history of prostate cancer 04/18/2021   Depression 60/11/9321   Complication of anesthesia 04/18/2021   Colon polyp 04/18/2021   Cigarette smoker 04/18/2021   Cancer ([place]) 04/18/2021   Coronary atherosclerosis 01/30/2021   Atherosclerosis of aorta ([place]) 01/30/2021   Primary hypertension 01/14/2021   COPD mixed type ([place]) 09/30/2019   Knee pain, left 08/28/2019   Elevated BP without diagnosis of hypertension 08/28/2019   Callus of foot 08/28/2019   Cholelithiasis 12/26/2018   Genetic testing 12/26/2017   Family history of breast cancer    Family history of pancreatic cancer    Family history of colon cancer    Abdominal pain 07/28/2016   Tobacco use disorder 05/08/2016  Morbid obesity ([place]) 03/13/2016   Dyspnea 11/09/2015   Well adult exam 07/23/2014   Sialoadenitis 06/02/2014   S/P radiation therapy 2016   Edema 07/07/2013   AB (asthmatic bronchitis) 06/23/2013   Anxiety    Malignant neoplasm of upper-outer quadrant of left breast in female, estrogen receptor positive ([place]) 06/09/2011   Allergy 06/06/2011   COLONIC POLYPS 01/25/2010   DIVERTICULOSIS OF COLON 01/25/2010   DEGENERATIVE JOINT DISEASE 11/06/2008   PANIC DISORDER 07/23/2008   Sarcoidosis 10/13/2007   Vitamin D deficiency 10/13/2007   ANEMIA 10/13/2007   BRONCHITIS 10/13/2007   Dyslipidemia 04/23/2007   Adjustment disorder with mixed anxiety and depressed mood 04/23/2007   Venous (peripheral) insufficiency 04/23/2007   HEADACHE 04/23/2007    PCP: Plotnikov, Evie Lacks, MD  REFERRING PROVIDER: Leandrew Koyanagi, MD  REFERRING DIAG: Right hip pain [M25.551]   THERAPY DIAG:  Difficulty in walking, not elsewhere classified - Plan: PT plan of care cert/re-cert  Muscle weakness (generalized) - Plan: PT plan of care cert/re-cert  Pain in right hip - Plan: PT plan of care cert/re-cert  Localized edema - Plan: PT plan of care cert/re-cert  Chronic pain of left knee - Plan: PT plan of care cert/re-cert  Rationale for Evaluation and Treatment: Rehabilitation  ONSET DATE: 2013   SUBJECTIVE:   SUBJECTIVE STATEMENT: Katana comes in today for evaluation of right lateral hip pain that radiates down the leg to the knee.  She has a hx of breast cancer and states that this pain started during her 5 year course of taking medications for her CA. She reports intermittent relief prior to the pain returning. She reports difficulty laying on or weight bearing through her R LE. Pt also with secondary R foot pain with a sx scheduled next week to remove a bone. 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 Denies any radicular symptoms.   PERTINENT HISTORY: COPD, Anxiety, Depression, DJD, vitamin D deficiency  PAIN:  Are you having pain? Yes: NPRS scale: 7-8/10 Pain location: R hip  Pain description: Sharp pain with intermittent shooting pain.  Aggravating factors: Weight bearing, stairs,  Relieving factors: Rest, biofreeze, ice.   PRECAUTIONS: None  WEIGHT BEARING RESTRICTIONS: No  FALLS:  Has patient fallen in last 6 months? No  LIVING ENVIRONMENT: Lives with: lives with their spouse Lives in: House/apartment Stairs: Yes: Internal: 6-8 steps; on right going up and External: 4 steps; bilateral but cannot reach both Has following equipment at home:  Chair to go up/down stairs  OCCUPATION: Retired   PLOF: Independent  PATIENT GOALS: Pt would like to eliminate her pain   OBJECTIVE:   DIAGNOSTIC FINDINGS:  2 view x-rays of the lumbar spine shows no acute  abnormalities.  There is mild intervertebral disc space narrowing between L5-S1 and L4-L5.  Lumbar  spondylosis.   PATIENT SURVEYS:  FOTO 48.66%, 63% in 12 visits.   COGNITION: Overall cognitive status: Within functional limits for tasks assessed     SENSATION: WFL  POSTURE: No Significant postural limitations  PALPATION: Tenderness over hip (trochanter)   LOWER EXTREMITY ROM:  Active ROM Right eval Left eval  Hip flexion [facility]  Hip internal rotation Severe limitation P! Severe limitation  Hip external rotation Severe limitation P! Severe limitation  Knee flexion [facility] WFL P!  Knee extension WFL WFL   (Blank rows = not tested)  LOWER EXTREMITY MMT:  MMT Right eval Left eval  Hip flexion 5 5  Knee flexion 5 4-  Knee extension 5 4-   (Blank rows = not tested)  LOWER EXTREMITY SPECIAL TESTS:  Hip special tests: Ober's test: negative and Hip scouring test: negative  FUNCTIONAL TESTS:  Assess next session  GAIT: Distance walked: 59f Assistive device utilized: None Level of assistance: Complete Independence Comments: Pt walks with antalgic gait due to pain.    TODAY'S TREATMENT:                                                                                                                              DATE: Creating, reviewing, and completing below HEP    PATIENT EDUCATION:  Education details: Educated pt on anatomy and physiology of current symptoms, FOTO, diagnosis, prognosis, HEP,  and POC. Person educated: Patient Education method: ECustomer service managerEducation comprehension: verbalized understanding and returned demonstration  HOME EXERCISE PROGRAM: Access Code: [email or web address] URL: [email or web address] Date: 02/07/2022 Prepared by: SRudi Heap Exercises - Supine Active Straight Leg Raise  - 2 x daily - 7 x weekly - 2 sets - 10 reps - Supine Hip Adduction Isometric with Ball  - 2 x daily - 7 x weekly - 2 sets - 10  reps   ASSESSMENT:  CLINICAL IMPRESSION: Patient referred to PT for R hip pain. She demonstrates weakness in L LE secondary knee pain and OA. Pt also with a surgery scheduled next week due to inflammation and bone growth in her R foot. Pt walks with an antalgic gait pattern due to variety of deficits. She is tender to palpation surrounding R trochanter which is in agreement with diagnosis of trochanteric bursitis. Patient will benefit from skilled PT to address below impairments, limitations and improve overall function.  OBJECTIVE IMPAIRMENTS: decreased activity tolerance, difficulty walking, decreased balance, decreased endurance, decreased mobility, decreased ROM, decreased strength, impaired flexibility, impaired LE use, postural dysfunction, and pain.  ACTIVITY LIMITATIONS: bending, lifting, carry, locomotion, cleaning, community activity, driving, and or occupation  PERSONAL FACTORS: COPD, Anxiety, Depression, DJD, vitamin D deficiency  are also affecting patient's functional outcome.  REHAB POTENTIAL: Good  CLINICAL DECISION MAKING: Evolving/moderate complexity  EVALUATION COMPLEXITY: Moderate    GOALS: Short term PT Goals Target date: 03/07/2022 Pt will be I and compliant with HEP. Baseline:  Goal status: New Pt will decrease pain by 25% overall Baseline: Goal status: New  Long term PT goals Target date: 04/18/2022 Pt will improve [facility] functional mobility Baseline: Goal status: New Pt will improve knee strength to at least 5-/5 MMT to improve functional strength Baseline: Goal status: New Pt will improve FOTO to at least 63% functional to show improved function Baseline: Goal status: New Pt will reduce pain by overall 50% overall with usual activity Baseline: Goal status: New Pt will reduce pain to overall less than 2-3/10 with usual activity and work activity. Baseline: Goal status: New Pt will be able to ambulate community distances at least 1000  ft WNL gait  pattern without complaints Baseline: Goal status: New  PLAN: PT FREQUENCY: 1-3 times per week   PT DURATION: 6-8 weeks  PLANNED INTERVENTIONS (unless contraindicated): aquatic PT, Canalith repositioning, cryotherapy, Electrical stimulation, Iontophoresis with 4 mg/ml dexamethasome, Moist heat, traction, Ultrasound, gait training, Therapeutic exercise, balance training, neuromuscular re-education, patient/family education, prosthetic training, manual techniques, passive ROM, dry needling, taping, vasopnuematic device, vestibular, spinal manipulations, joint manipulations  PLAN FOR NEXT SESSION: Assess HEP/update PRN, continue to progress functional mobility, strengthen proximal hip muscles. Decrease patients pain and help minimize functional deficits.    Lynden Ang, PT 02/07/2022, 3:20 PM

## 2022-02-08 ENCOUNTER — Other Ambulatory Visit: Payer: No Typology Code available for payment source

## 2022-02-09 ENCOUNTER — Ambulatory Visit (INDEPENDENT_AMBULATORY_CARE_PROVIDER_SITE_OTHER): Payer: No Typology Code available for payment source

## 2022-02-09 ENCOUNTER — Ambulatory Visit: Payer: No Typology Code available for payment source | Admitting: Internal Medicine

## 2022-02-09 ENCOUNTER — Encounter: Payer: Self-pay | Admitting: Internal Medicine

## 2022-02-09 VITALS — BP 130/78 | HR 76 | Temp 98.2°F | Ht 65.0 in | Wt 219.2 lb

## 2022-02-09 DIAGNOSIS — Z01818 Encounter for other preprocedural examination: Secondary | ICD-10-CM

## 2022-02-09 DIAGNOSIS — R7309 Other abnormal glucose: Secondary | ICD-10-CM | POA: Diagnosis not present

## 2022-02-09 DIAGNOSIS — J449 Chronic obstructive pulmonary disease, unspecified: Secondary | ICD-10-CM

## 2022-02-09 DIAGNOSIS — I2510 Atherosclerotic heart disease of native coronary artery without angina pectoris: Secondary | ICD-10-CM | POA: Diagnosis not present

## 2022-02-09 DIAGNOSIS — I2583 Coronary atherosclerosis due to lipid rich plaque: Secondary | ICD-10-CM

## 2022-02-09 LAB — CBC WITH DIFFERENTIAL/PLATELET
Basophils Absolute: 0 10*3/uL (ref 0.0–0.1)
Basophils Relative: 0.6 % (ref 0.0–3.0)
Eosinophils Absolute: 0.3 10*3/uL (ref 0.0–0.7)
Eosinophils Relative: 3.6 % (ref 0.0–5.0)
HCT: 37 % (ref 36.0–46.0)
Hemoglobin: 12 g/dL (ref 12.0–15.0)
Lymphocytes Relative: 23.3 % (ref 12.0–46.0)
Lymphs Abs: 2 10*3/uL (ref 0.7–4.0)
MCHC: 32.4 g/dL (ref 30.0–36.0)
MCV: 74.3 fl — ABNORMAL LOW (ref 78.0–100.0)
Monocytes Absolute: 0.7 10*3/uL (ref 0.1–1.0)
Monocytes Relative: 7.8 % (ref 3.0–12.0)
Neutro Abs: 5.5 10*3/uL (ref 1.4–7.7)
Neutrophils Relative %: 64.7 % (ref 43.0–77.0)
Platelets: 267 10*3/uL (ref 150.0–400.0)
RBC: 4.97 Mil/uL (ref 3.87–5.11)
RDW: 14.7 % (ref 11.5–15.5)
WBC: 8.5 10*3/uL (ref 4.0–10.5)

## 2022-02-09 LAB — URINALYSIS
Bilirubin Urine: NEGATIVE
Ketones, ur: NEGATIVE
Leukocytes,Ua: NEGATIVE
Nitrite: NEGATIVE
Specific Gravity, Urine: 1.02 (ref 1.000–1.030)
Total Protein, Urine: NEGATIVE
Urine Glucose: NEGATIVE
Urobilinogen, UA: 0.2 (ref 0.0–1.0)
pH: 6 (ref 5.0–8.0)

## 2022-02-09 LAB — COMPREHENSIVE METABOLIC PANEL
ALT: 11 U/L (ref 0–35)
AST: 14 U/L (ref 0–37)
Albumin: 4.1 g/dL (ref 3.5–5.2)
Alkaline Phosphatase: 81 U/L (ref 39–117)
BUN: 9 mg/dL (ref 6–23)
CO2: 28 mEq/L (ref 19–32)
Calcium: 9.7 mg/dL (ref 8.4–10.5)
Chloride: 106 mEq/L (ref 96–112)
Creatinine, Ser: 0.69 mg/dL (ref 0.40–1.20)
GFR: 89.47 mL/min (ref >60.00)
Glucose, Bld: 92 mg/dL (ref 70–99)
Potassium: 4 mEq/L (ref 3.5–5.1)
Sodium: 139 mEq/L (ref 135–145)
Total Bilirubin: 0.4 mg/dL (ref 0.2–1.2)
Total Protein: 7.1 g/dL (ref 6.0–8.3)

## 2022-02-09 LAB — HEMOGLOBIN A1C: Hgb A1c MFr Bld: 6.2 % (ref 4.6–6.5)

## 2022-02-09 NOTE — Assessment & Plan Note (Signed)
Cont on Symbicort Get CXR, labs Surgical clearance - planning to have R foot surgery on 02/14/2022: she is clear for surgery Thanks

## 2022-02-09 NOTE — Progress Notes (Signed)
Subjective:  Patient ID: Marilyn [name], female    DOB: 05-12-1955  Age: 66 y.o. MRN: [id number]  CC: Follow-up (F/U from ER)   HPI Marilyn [name] presents for surgical clearance - planning to have R foot surgery on 02/14/2022. H/o URI on 11/9 - treated w/MDI, Prednisone x 5 d, cough syrup. CXR was ok on 01/05/22  Outpatient Medications Prior to Visit  Medication Sig Dispense Refill   albuterol (VENTOLIN HFA) 108 (90 Base) MCG/ACT inhaler Inhale 2 puffs into the lungs every 6 (six) hours as needed for wheezing or shortness of breath. 18 g 5   ALPRAZolam (XANAX) 1 MG tablet Take 0.5-1 tablets (0.5-1 mg total) by mouth 3 (three) times daily as needed for anxiety. 90 tablet 2   budesonide-formoterol (SYMBICORT) 80-4.5 MCG/ACT inhaler Inhale 2 puffs into the lungs 2 (two) times daily. 1 each 11   busPIRone (BUSPAR) 30 MG tablet TAKE 1/2 TO 1 (ONE-HALF TO ONE) TABLET BY MOUTH TWICE DAILY 60 tablet 5   celecoxib (CELEBREX) 200 MG capsule Take 1 capsule (200 mg total) by mouth daily as needed for moderate pain. 30 capsule 3   Cholecalciferol 5000 UNITS TABS Take 1 tablet by mouth 3 (three) times a week.     clobetasol cream (TEMOVATE) 3.53 % Apply 1 application. topically 2 (two) times daily. 60 g 3   diclofenac Sodium (VOLTAREN) 1 % GEL Apply 4 g topically 4 (four) times daily. (Patient taking differently: Apply 4 g topically as needed (Joint pain).)     ipratropium (ATROVENT) 0.03 % nasal spray Place 2 sprays into both nostrils 2 (two) times daily as needed for rhinitis. 30 mL 0   Multiple Vitamin (MULTIVITAMIN WITH MINERALS) TABS tablet Take 1 tablet by mouth daily. Unknown strength     triamcinolone ointment (KENALOG) 0.1 % Apply 1 Application topically 3 (three) times daily. 80 g 2   amLODipine (NORVASC) 10 MG tablet Take 1 tablet (10 mg total) by mouth daily. 180 tablet 3   HYDROcodone bit-homatropine (HYCODAN) 5-1.5 MG/5ML syrup Take 5 mLs by mouth every 6 (six) hours as  needed for cough. (Patient not taking: Reported on 02/09/2022) 90 mL 0   No facility-administered medications prior to visit.    ROS: Review of Systems  Constitutional:  Negative for activity change, appetite change, chills, fatigue and unexpected weight change.  HENT:  Negative for congestion, mouth sores and sinus pressure.   Eyes:  Negative for visual disturbance.  Respiratory:  Positive for cough. Negative for chest tightness and shortness of breath.   Gastrointestinal:  Negative for abdominal pain and nausea.  Genitourinary:  Negative for difficulty urinating, frequency and vaginal pain.  Musculoskeletal:  Positive for arthralgias and gait problem. Negative for back pain.  Skin:  Negative for pallor and rash.  Neurological:  Negative for dizziness, tremors, weakness, numbness and headaches.  Psychiatric/Behavioral:  Negative for confusion and sleep disturbance.     Objective:  BP 130/78 (BP Location: Left Arm)   Pulse 76   Temp 98.2 F (36.8 C) (Oral)   Ht '5\' 5"'$  (1.651 m)   Wt 219 lb 3.2 oz (99.4 kg)   SpO2 98%   BMI 36.48 kg/m   BP Readings from Last 3 Encounters:  02/09/22 130/78  01/13/22 118/65  01/05/22 134/81    Wt Readings from Last 3 Encounters:  02/09/22 219 lb 3.2 oz (99.4 kg)  01/13/22 212 lb 11.2 oz (96.5 kg)  12/29/21 214 lb 12.8 oz (97.4 kg)  Physical Exam Constitutional:      General: She is not in acute distress.    Appearance: She is well-developed. She is obese.  HENT:     Head: Normocephalic.     Right Ear: External ear normal.     Left Ear: External ear normal.     Nose: Nose normal.  Eyes:     General:        Right eye: No discharge.        Left eye: No discharge.     Conjunctiva/sclera: Conjunctivae normal.     Pupils: Pupils are equal, round, and reactive to light.  Neck:     Thyroid: No thyromegaly.     Vascular: No JVD.     Trachea: No tracheal deviation.  Cardiovascular:     Rate and Rhythm: Normal rate and regular rhythm.      Heart sounds: Normal heart sounds.  Pulmonary:     Effort: No respiratory distress.     Breath sounds: No stridor. No wheezing.  Abdominal:     General: Bowel sounds are normal. There is no distension.     Palpations: Abdomen is soft. There is no mass.     Tenderness: There is no abdominal tenderness. There is no guarding or rebound.  Musculoskeletal:        General: No tenderness.     Cervical back: Normal range of motion and neck supple. No rigidity.  Lymphadenopathy:     Cervical: No cervical adenopathy.  Skin:    Findings: No erythema or rash.  Neurological:     Cranial Nerves: No cranial nerve deficit.     Motor: No abnormal muscle tone.     Coordination: Coordination normal.     Deep Tendon Reflexes: Reflexes normal.  Psychiatric:        Behavior: Behavior normal.        Thought Content: Thought content normal.        Judgment: Judgment normal.     Lab Results  Component Value Date   WBC 7.6 03/10/2021   HGB 11.7 (L) 03/10/2021   HCT 37.7 03/10/2021   PLT 244 03/10/2021   GLUCOSE 102 (H) 03/10/2021   CHOL 209 (H) 12/28/2020   TRIG 70.0 12/28/2020   HDL 69.20 12/28/2020   LDLDIRECT 102 (H) 10/14/2021   LDLCALC 126 (H) 12/28/2020   ALT 11 12/28/2020   AST 14 12/28/2020   NA 138 03/10/2021   K 3.7 03/10/2021   CL 109 03/10/2021   CREATININE 0.70 03/10/2021   BUN 10 03/10/2021   CO2 22 03/10/2021   TSH 1.25 12/28/2020    DG Chest 2 View  Result Date: 01/05/2022 CLINICAL DATA:  Dyspnea EXAM: CHEST - 2 VIEW COMPARISON:  03/10/2021 FINDINGS: Lungs are well expanded, symmetric, and clear. No pneumothorax or pleural effusion. Cardiac size within normal limits. Pulmonary vascularity is normal. Osseous structures are age-appropriate. No acute bone abnormality. Surgical clips seen within the a left breast, unchanged. IMPRESSION: 1. No active cardiopulmonary disease. Electronically Signed   By: Fidela Salisbury M.D.   On: 01/05/2022 17:26    Assessment & Plan:    Problem List Items Addressed This Visit     Preop exam for internal medicine - Primary    Cont on Symbicort Get CXR, labs Surgical clearance - planning to have R foot surgery on 02/14/2022: she is clear for surgery Thanks      Relevant Orders   Comprehensive metabolic panel   Hemoglobin A1c   CBC with  Differential/Platelet   DG Chest 2 View   Urinalysis   Coronary atherosclerosis    Coronary calcium CT score of 79.6.  Pt stopped smoking 12/2020      COPD mixed type [facility])    Coronary calcium CT score of 79.6.  Cont on Symbicort Get CXR, labs Surgical clearance - planning to have R foot surgery on 02/14/2022: she is clear for surgery      Relevant Orders   DG Chest 2 View   Other Visit Diagnoses     Elevated glucose       Relevant Orders   Comprehensive metabolic panel   Hemoglobin A1c         No orders of the defined types were placed in this encounter.     Follow-up: Return in about 4 months (around 06/11/2022) for a follow-up visit.  Walker Kehr, MD

## 2022-02-09 NOTE — Assessment & Plan Note (Addendum)
Coronary calcium CT score of 79.6.  Cont on Symbicort Get CXR, labs Surgical clearance - planning to have R foot surgery on 02/14/2022: she is clear for surgery

## 2022-02-09 NOTE — Assessment & Plan Note (Signed)
Coronary calcium CT score of 79.6.  Pt stopped smoking 12/2020

## 2022-02-10 ENCOUNTER — Encounter: Payer: Self-pay | Admitting: Rehabilitative and Restorative Service Providers"

## 2022-02-10 ENCOUNTER — Ambulatory Visit: Payer: No Typology Code available for payment source | Admitting: Rehabilitative and Restorative Service Providers"

## 2022-02-10 DIAGNOSIS — R600 Localized edema: Secondary | ICD-10-CM

## 2022-02-10 DIAGNOSIS — R262 Difficulty in walking, not elsewhere classified: Secondary | ICD-10-CM | POA: Diagnosis not present

## 2022-02-10 DIAGNOSIS — M25551 Pain in right hip: Secondary | ICD-10-CM | POA: Diagnosis not present

## 2022-02-10 DIAGNOSIS — M6281 Muscle weakness (generalized): Secondary | ICD-10-CM

## 2022-02-10 DIAGNOSIS — M25562 Pain in left knee: Secondary | ICD-10-CM

## 2022-02-10 DIAGNOSIS — G8929 Other chronic pain: Secondary | ICD-10-CM

## 2022-02-10 NOTE — Therapy (Signed)
OUTPATIENT PHYSICAL THERAPY TREATMENT NOTE   Patient Name: Marilyn [name] MRN: [id number] DOB:[birth year], 66 y.o., female Today's Date: 02/10/2022  END OF SESSION:   PT End of Session - 02/10/22 1056     Visit Number 2    Number of Visits 16    Date for PT Re-Evaluation 04/18/22    PT Start Time 1053    PT Stop Time 1140    PT Time Calculation (min) 47 min    Activity Tolerance Patient tolerated treatment well    Behavior During Therapy [facility] for tasks assessed/performed             Past Medical History:  Diagnosis Date   Allergy    Anemia    hx   Anxiety    panic disorder   Breast cancer ([place]) 06/06/2011   bc  left breast 3 o'clock dx=invasive ductal ca uoqER/PR=positive   Cancer (HCC)    BREAST - left   Cigarette smoker    Colon polyp    Complication of anesthesia    DIFFICULTY AWAKENING, BP INCREASE AFTER CHOLECYSTECTEMY    COPD (chronic obstructive pulmonary disease) (HCC)    Depression    Diverticulosis of colon    DJD (degenerative joint disease)    DJD (degenerative joint disease)    Family history of breast cancer    Family history of colon cancer    Family history of pancreatic cancer    Family history of prostate cancer    Gallstones    GERD (gastroesophageal reflux disease)    Pt. denies having GERD. Unable to remove it.   Headache(784.0)    History of anemia    History of bronchitis    History of sarcoidosis    Hyperlipidemia    no meds needed   Incisional breast wound    AT AGE 77 BILATERAL INCISION TO BREAST MADE   Leg swelling    Panic disorder    Personal history of radiation therapy    S/P radiation therapy 08/17/11 - 09/28/11   LLQ - 50 Gy/25 Fractions with Boost of 10 Gy / 5 fractions   Sialoadenitis of submandibular gland 2016   Vitamin D deficiency    Past Surgical History:  Procedure Laterality Date   BIOPSY BREAST     left breast  as a teenager  benign   BREAST BIOPSY Left 06/06/2011   BREAST EXCISIONAL BIOPSY Bilateral     BREAST LUMPECTOMY Left 2013   BREAST SURGERY  07/05/11   left breast lumpectomy with needle loc & axillary sln bx   BUNIONECTOMY  2011   bilateral by Dr Little Ishikawa   CHOLECYSTECTOMY  02/2019   COLONOSCOPY     polyp   cyst removed from right hand  [place]  2000   Dr Gae Gallop FINGER RELEASE  2008   Dr Burney Gauze   Patient Active Problem List   Diagnosis Date Noted   Preop exam for internal medicine 02/09/2022   Plantar flexed metatarsal bone of right foot 01/11/2022   Grief 09/28/2021   Leg swelling 04/18/2021   Incisional breast wound 04/18/2021   History of sarcoidosis 04/18/2021   History of bronchitis 04/18/2021   GERD (gastroesophageal reflux disease) 04/18/2021   Family history of prostate cancer 04/18/2021   Depression 13/24/4010   Complication of anesthesia 04/18/2021   Colon polyp 04/18/2021   Cigarette smoker 04/18/2021   Cancer ([place]) 04/18/2021  Coronary atherosclerosis 01/30/2021   Atherosclerosis of aorta ([place]) 01/30/2021   Primary hypertension 01/14/2021   COPD mixed type ([place]) 09/30/2019   Knee pain, left 08/28/2019   Elevated BP without diagnosis of hypertension 08/28/2019   Callus of foot 08/28/2019   Cholelithiasis 12/26/2018   Genetic testing 12/26/2017   Family history of breast cancer    Family history of pancreatic cancer    Family history of colon cancer    Abdominal pain 07/28/2016   Tobacco use disorder 05/08/2016   Morbid obesity ([place]) 03/13/2016   Dyspnea 11/09/2015   Well adult exam 07/23/2014   Sialoadenitis 06/02/2014   S/P radiation therapy 2016   Edema 07/07/2013   AB (asthmatic bronchitis) 06/23/2013   Anxiety    Malignant neoplasm of upper-outer quadrant of left breast in female, estrogen receptor positive ([place]) 06/09/2011   Allergy 06/06/2011   COLONIC POLYPS 01/25/2010   DIVERTICULOSIS OF COLON 01/25/2010   DEGENERATIVE JOINT DISEASE 11/06/2008   PANIC  DISORDER 07/23/2008   Sarcoidosis 10/13/2007   Vitamin D deficiency 10/13/2007   ANEMIA 10/13/2007   BRONCHITIS 10/13/2007   Dyslipidemia 04/23/2007   Adjustment disorder with mixed anxiety and depressed mood 04/23/2007   Venous (peripheral) insufficiency 04/23/2007   HEADACHE 04/23/2007     THERAPY DIAG:  Difficulty in walking, not elsewhere classified  Muscle weakness (generalized)  Pain in right hip  Localized edema  Chronic pain of left knee  PCP: Marilyn [name], Marilyn Lacks, MD   REFERRING PROVIDER: Leandrew Koyanagi, MD   REFERRING DIAG: Right hip pain [M25.551]    THERAPY DIAG:  Difficulty in walking, not elsewhere classified - Plan: PT plan of care cert/re-cert   Muscle weakness (generalized) - Plan: PT plan of care cert/re-cert   Pain in right hip - Plan: PT plan of care cert/re-cert   Localized edema - Plan: PT plan of care cert/re-cert   Chronic pain of left knee - Plan: PT plan of care cert/re-cert   Rationale for Evaluation and Treatment: Rehabilitation   ONSET DATE: 2013     SUBJECTIVE:    SUBJECTIVE STATEMENT: Marilyn [name] reports compliance with early icing.  Compliance with her home exercises has been poor thus far.    PERTINENT HISTORY: COPD, Anxiety, Depression, DJD, vitamin D deficiency  PAIN:  Are you having pain? Yes: NPRS scale: 7-8/10 Pain location: R hip  Pain description: Sharp pain with intermittent shooting pain.  Aggravating factors: Weight bearing, stairs,  Relieving factors: Rest, biofreeze, ice.    PRECAUTIONS: None   WEIGHT BEARING RESTRICTIONS: No   FALLS:  Has patient fallen in last 6 months? No   LIVING ENVIRONMENT: Lives with: lives with their spouse Lives in: House/apartment Stairs: Yes: Internal: 6-8 steps; on right going up and External: 4 steps; bilateral but cannot reach both Has following equipment at home:  Chair to go up/down stairs   OCCUPATION: Retired    PLOF: Independent   PATIENT GOALS: Pt would like to  eliminate her pain     OBJECTIVE:    DIAGNOSTIC FINDINGS:  2 view x-rays of the lumbar spine shows no acute abnormalities.  There is mild intervertebral disc space narrowing between L5-S1 and L4-L5.  Lumbar  spondylosis.    PATIENT SURVEYS:  FOTO 48.66%, 63% in 12 visits.    COGNITION: Overall cognitive status: Within functional limits for tasks assessed  SENSATION: WFL   POSTURE: No Significant postural limitations   PALPATION: Tenderness over hip (trochanter)    LOWER EXTREMITY ROM:   Active ROM Right eval Left eval Left/Right in degrees  Hip flexion [facility] 90/75  Hip internal rotation Severe limitation P! Severe limitation 8/8  Hip external rotation Severe limitation P! Severe limitation 21/18  Knee flexion [facility] WFL P!   Knee extension [facility] WFL    (Blank rows = not tested) 02/10/2022  Hamstrings: 45/45 degrees  LOWER EXTREMITY MMT:   MMT Right eval Left eval  Hip flexion 5 5  Knee flexion 5 4-  Knee extension 5 4-   (Blank rows = not tested)   LOWER EXTREMITY SPECIAL TESTS:  Hip special tests: Ober's test: negative and Hip scouring test: negative   FUNCTIONAL TESTS:  Assess next session   GAIT: Distance walked: 79f Assistive device utilized: None Level of assistance: Complete Independence Comments: Pt walks with antalgic gait due to pain.      TODAY'S TREATMENT:   02/10/2022: Reviewed home exercise program from day 1          Supine hip flexors stretch with 1 leg off table and the other leg bent 4 x 20 seconds           Single knee-to-chest stretch with other leg straight 4 x 20 seconds                                     Figure 4 stretch 4 x 20 seconds right side only Gluteal stretch 4 x 20 seconds Standing lumbar/hip extension 10 x 3 seconds Standing hip hike in doorway 2 sets of 5 for 3 seconds Standing hip hike at treatment table 2 sets of 10 for 3 seconds                                                                      DATE: Creating, reviewing, and completing below HEP      PATIENT EDUCATION:  Education details: Educated pt on anatomy and physiology of current symptoms, FOTO, diagnosis, prognosis, HEP,  and POC. Person educated: Patient Education method: ECustomer service managerEducation comprehension: verbalized understanding and returned demonstration   HOME EXERCISE PROGRAM: Access Code: [email or web address] URL: [email or web address] Date: 02/10/2022 Prepared by: RVista Mink Exercises - Supine Active Straight Leg Raise  - 2 x daily - 7 x weekly - 2 sets - 10 reps - Supine Hip Adduction Isometric with Ball  - 2 x daily - 7 x weekly - 2 sets - 10 reps - Standing Lumbar [place] - 5 x daily - 7 x weekly - 1 sets - 5 reps - 3 seconds hold - Single Knee to Chest Stretch  - 2-3 x daily - 7 x weekly - 1 sets - 5 reps - 20 seconds hold - Supine Figure 4 Piriformis Stretch  - 2-3 x daily - 7 x weekly - 1 sets - 5 reps - 20 seconds hold - Standing Hip Hiking  - 10 x daily - 7 x weekly - 1 sets - 10 reps - 3 seconds hold   ASSESSMENT:   CLINICAL  IMPRESSION: Iisha did a good job with her progressions to her home exercise program today.  She has significant hip tightness, particularly hip flexors and hip external rotation.  She also has significant hip abductors weakness which was addressed today.  I recommended she do her hip hike at a countertop as she had significant weakness that did not allow her to perform this exercise correctly without compensation.  With continued hip hip flexibility, AROM and strength work she should meet long-term goals established at evaluation.   OBJECTIVE IMPAIRMENTS: decreased activity tolerance, difficulty walking, decreased balance, decreased endurance, decreased mobility, decreased ROM, decreased strength, impaired flexibility, impaired LE use, postural dysfunction, and pain.   ACTIVITY LIMITATIONS: bending, lifting, carry,  locomotion, cleaning, community activity, driving, and or occupation   PERSONAL FACTORS: COPD, Anxiety, Depression, DJD, vitamin D deficiency  are also affecting patient's functional outcome.   REHAB POTENTIAL: Good   CLINICAL DECISION MAKING: Evolving/moderate complexity   EVALUATION COMPLEXITY: Moderate       GOALS: Short term PT Goals Target date: 03/07/2022 Pt will be I and compliant with HEP. Baseline:  Goal status: On Going 02/10/2022 Pt will decrease pain by 25% overall Baseline: Goal status: On Going 02/10/2022   Long term PT goals Target date: 04/18/2022 Pt will improve ROM to [facility] to improve functional mobility Baseline: Goal status: On Going 02/10/2022 Pt will improve knee strength to at least 5-/5 MMT to improve functional strength Baseline: Goal status: New Pt will improve FOTO to at least 63% functional to show improved function Baseline: Goal status: New Pt will reduce pain by overall 50% overall with usual activity Baseline: Goal status: On Going 02/10/2022 Pt will reduce pain to overall less than 2-3/10 with usual activity and work activity. Baseline: Goal status:  On Going 02/10/2022 Pt will be able to ambulate community distances at least 1000 ft WNL gait pattern without complaints Baseline: Goal status: On Going 02/10/2022   PLAN: PT FREQUENCY: 1-3 times per week    PT DURATION: 6-8 weeks   PLANNED INTERVENTIONS (unless contraindicated): aquatic PT, Canalith repositioning, cryotherapy, Electrical stimulation, Iontophoresis with 4 mg/ml dexamethasome, Moist heat, traction, Ultrasound, gait training, Therapeutic exercise, balance training, neuromuscular re-education, patient/family education, prosthetic training, manual techniques, passive ROM, dry needling, taping, vasopnuematic device, vestibular, spinal manipulations, joint manipulations   PLAN FOR NEXT SESSION: Assess HEP/update PRN, continue to progress functional mobility, strengthen proximal hip  muscles with emphasis on hip abductors/quadriceps.  Decrease patient's pain and help minimize functional deficits.      Farley Ly, PT, MPT 02/10/2022, 12:04 PM

## 2022-02-13 ENCOUNTER — Encounter: Payer: No Typology Code available for payment source | Admitting: Physical Therapy

## 2022-02-13 ENCOUNTER — Telehealth: Payer: Self-pay | Admitting: Podiatry

## 2022-02-13 MED ORDER — HYDROCODONE-ACETAMINOPHEN 10-325 MG PO TABS: 1.0000 | ORAL_TABLET | Freq: Three times a day (TID) | ORAL | 0 refills | Status: DC | PRN

## 2022-02-13 NOTE — Addendum Note (Signed)
Addended by: Wallene Huh on: 02/13/2022 05:10 PM   Modules accepted: Orders

## 2022-02-13 NOTE — Telephone Encounter (Signed)
Should be able to take with a sip of water in am

## 2022-02-13 NOTE — Telephone Encounter (Signed)
I'm set up for surgery on my foot tomorrow and I've got a question about wether or not to take my amlodipine and buspirone the morning of my surgery.

## 2022-02-14 ENCOUNTER — Encounter: Payer: Self-pay | Admitting: Podiatry

## 2022-02-14 ENCOUNTER — Telehealth: Payer: Self-pay | Admitting: *Deleted

## 2022-02-14 DIAGNOSIS — M2011 Hallux valgus (acquired), right foot: Secondary | ICD-10-CM

## 2022-02-14 NOTE — Telephone Encounter (Signed)
Patient is calling to ask how long to wear the boot , explained until her upcoming post op appointment.

## 2022-02-16 ENCOUNTER — Other Ambulatory Visit: Payer: Self-pay | Admitting: *Deleted

## 2022-02-16 MED ORDER — HYDROCODONE-ACETAMINOPHEN 10-325 MG PO TABS: 1.0000 | ORAL_TABLET | Freq: Three times a day (TID) | ORAL | 0 refills | Status: AC | PRN

## 2022-02-16 NOTE — Telephone Encounter (Signed)
Patient is needing her prescription resent to another pharmacy, out of stock at the one on [address], called them and they have in stock at [facility]

## 2022-02-22 ENCOUNTER — Ambulatory Visit (INDEPENDENT_AMBULATORY_CARE_PROVIDER_SITE_OTHER): Payer: No Typology Code available for payment source | Admitting: Podiatry

## 2022-02-22 ENCOUNTER — Other Ambulatory Visit: Payer: No Typology Code available for payment source

## 2022-02-22 ENCOUNTER — Ambulatory Visit (INDEPENDENT_AMBULATORY_CARE_PROVIDER_SITE_OTHER): Payer: No Typology Code available for payment source

## 2022-02-22 DIAGNOSIS — Z09 Encounter for follow-up examination after completed treatment for conditions other than malignant neoplasm: Secondary | ICD-10-CM

## 2022-02-22 NOTE — Progress Notes (Signed)
Patient seen at the office for POV #1 DOS 02/14/2022 5th Metatarsal Head Resection Rt. Patient denies any fever, nausea, vomiting, chills, chest pain. Pain is minimal at this time. No sign of infection noted. Patient is to use a surgical shoe and in 2 weeks she will be able to use a regular shoe. Patient is able to apply weight to foot. Foot redressed with a coban wrap and surgical shoe was dispensed today.   Patient was advised to contact office with any concerns or questions.

## 2022-03-02 ENCOUNTER — Encounter: Payer: No Typology Code available for payment source | Admitting: Physical Therapy

## 2022-03-07 ENCOUNTER — Ambulatory Visit: Payer: No Typology Code available for payment source | Admitting: Physical Therapy

## 2022-03-07 ENCOUNTER — Encounter: Payer: Self-pay | Admitting: Physical Therapy

## 2022-03-07 DIAGNOSIS — M25551 Pain in right hip: Secondary | ICD-10-CM | POA: Diagnosis not present

## 2022-03-07 DIAGNOSIS — M25562 Pain in left knee: Secondary | ICD-10-CM

## 2022-03-07 DIAGNOSIS — R262 Difficulty in walking, not elsewhere classified: Secondary | ICD-10-CM | POA: Diagnosis not present

## 2022-03-07 DIAGNOSIS — R600 Localized edema: Secondary | ICD-10-CM

## 2022-03-07 DIAGNOSIS — M6281 Muscle weakness (generalized): Secondary | ICD-10-CM | POA: Diagnosis not present

## 2022-03-07 DIAGNOSIS — G8929 Other chronic pain: Secondary | ICD-10-CM

## 2022-03-07 NOTE — Therapy (Signed)
OUTPATIENT PHYSICAL THERAPY TREATMENT NOTE   Patient Name: Marilyn [name] MRN: [id number] DOB:[birth year], 67 y.o., female Today's Date: 03/07/2022  END OF SESSION:   PT End of Session - 03/07/22 1059     Visit Number 3    Number of Visits 16    Date for PT Re-Evaluation 04/18/22    PT Start Time 1100    PT Stop Time 2947    PT Time Calculation (min) 45 min    Activity Tolerance Patient tolerated treatment well    Behavior During Therapy [facility] for tasks assessed/performed              Past Medical History:  Diagnosis Date   Allergy    Anemia    hx   Anxiety    panic disorder   Breast cancer ([place]) 06/06/2011   bc  left breast 3 o'clock dx=invasive ductal ca uoqER/PR=positive   Cancer (HCC)    BREAST - left   Cigarette smoker    Colon polyp    Complication of anesthesia    DIFFICULTY AWAKENING, BP INCREASE AFTER CHOLECYSTECTEMY    COPD (chronic obstructive pulmonary disease) (HCC)    Depression    Diverticulosis of colon    DJD (degenerative joint disease)    DJD (degenerative joint disease)    Family history of breast cancer    Family history of colon cancer    Family history of pancreatic cancer    Family history of prostate cancer    Gallstones    GERD (gastroesophageal reflux disease)    Pt. denies having GERD. Unable to remove it.   Headache(784.0)    History of anemia    History of bronchitis    History of sarcoidosis    Hyperlipidemia    no meds needed   Incisional breast wound    AT AGE 64 BILATERAL INCISION TO BREAST MADE   Leg swelling    Panic disorder    Personal history of radiation therapy    S/P radiation therapy 08/17/11 - 09/28/11   LLQ - 50 Gy/25 Fractions with Boost of 10 Gy / 5 fractions   Sialoadenitis of submandibular gland 2016   Vitamin D deficiency    Past Surgical History:  Procedure Laterality Date   BIOPSY BREAST     left breast  as a teenager  benign   BREAST BIOPSY Left 06/06/2011   BREAST EXCISIONAL BIOPSY Bilateral     BREAST LUMPECTOMY Left 2013   BREAST SURGERY  07/05/11   left breast lumpectomy with needle loc & axillary sln bx   BUNIONECTOMY  2011   bilateral by Dr Little Ishikawa   CHOLECYSTECTOMY  02/2019   COLONOSCOPY     polyp   cyst removed from right hand  [place]  2000   Dr Gae Gallop FINGER RELEASE  2008   Dr Burney Gauze   Patient Active Problem List   Diagnosis Date Noted   Preop exam for internal medicine 02/09/2022   Plantar flexed metatarsal bone of right foot 01/11/2022   Grief 09/28/2021   Leg swelling 04/18/2021   Incisional breast wound 04/18/2021   History of sarcoidosis 04/18/2021   History of bronchitis 04/18/2021   GERD (gastroesophageal reflux disease) 04/18/2021   Family history of prostate cancer 04/18/2021   Depression 65/46/5035   Complication of anesthesia 04/18/2021   Colon polyp 04/18/2021   Cigarette smoker 04/18/2021   Cancer ([place]) 04/18/2021  Coronary atherosclerosis 01/30/2021   Atherosclerosis of aorta ([place]) 01/30/2021   Primary hypertension 01/14/2021   COPD mixed type ([place]) 09/30/2019   Knee pain, left 08/28/2019   Elevated BP without diagnosis of hypertension 08/28/2019   Callus of foot 08/28/2019   Cholelithiasis 12/26/2018   Genetic testing 12/26/2017   Family history of breast cancer    Family history of pancreatic cancer    Family history of colon cancer    Abdominal pain 07/28/2016   Tobacco use disorder 05/08/2016   Morbid obesity ([place]) 03/13/2016   Dyspnea 11/09/2015   Well adult exam 07/23/2014   Sialoadenitis 06/02/2014   S/P radiation therapy 2016   Edema 07/07/2013   AB (asthmatic bronchitis) 06/23/2013   Anxiety    Malignant neoplasm of upper-outer quadrant of left breast in female, estrogen receptor positive ([place]) 06/09/2011   Allergy 06/06/2011   COLONIC POLYPS 01/25/2010   DIVERTICULOSIS OF COLON 01/25/2010   DEGENERATIVE JOINT DISEASE 11/06/2008   PANIC  DISORDER 07/23/2008   Sarcoidosis 10/13/2007   Vitamin D deficiency 10/13/2007   ANEMIA 10/13/2007   BRONCHITIS 10/13/2007   Dyslipidemia 04/23/2007   Adjustment disorder with mixed anxiety and depressed mood 04/23/2007   Venous (peripheral) insufficiency 04/23/2007   HEADACHE 04/23/2007     THERAPY DIAG:  Difficulty in walking, not elsewhere classified  Muscle weakness (generalized)  Pain in right hip  Localized edema  Chronic pain of left knee  PCP: Plotnikov, Evie Lacks, MD   REFERRING PROVIDER: Leandrew Koyanagi, MD   REFERRING DIAG: Right hip pain [M25.551]    EVAL THERAPY DIAG:  Difficulty in walking, not elsewhere classified - Plan: PT plan of care cert/re-cert   Muscle weakness (generalized) - Plan: PT plan of care cert/re-cert   Pain in right hip - Plan: PT plan of care cert/re-cert   Localized edema - Plan: PT plan of care cert/re-cert   Chronic pain of left knee - Plan: PT plan of care cert/re-cert   Rationale for Evaluation and Treatment: Rehabilitation   ONSET DATE: 2013     SUBJECTIVE:    SUBJECTIVE STATEMENT: Hasn't done HEP much due to recent foot surgery about a month ago; pain feels about 30% improved  PERTINENT HISTORY: COPD, Anxiety, Depression, DJD, vitamin D deficiency  PAIN:  Are you having pain? Yes: NPRS scale: none currently/10 Pain location: R hip  Pain description: Sharp pain with intermittent shooting pain.  Aggravating factors: Weight bearing, stairs,  Relieving factors: Rest, biofreeze, ice.    PRECAUTIONS: None   WEIGHT BEARING RESTRICTIONS: No   FALLS:  Has patient fallen in last 6 months? No   LIVING ENVIRONMENT: Lives with: lives with their spouse Lives in: House/apartment Stairs: Yes: Internal: 6-8 steps; on right going up and External: 4 steps; bilateral but cannot reach both Has following equipment at home:  Chair to go up/down stairs   OCCUPATION: Retired    PLOF: Independent   PATIENT GOALS: Pt would like to  eliminate her pain     OBJECTIVE:    DIAGNOSTIC FINDINGS:  2 view x-rays of the lumbar spine shows no acute abnormalities.  There is mild intervertebral disc space narrowing between L5-S1 and L4-L5.  Lumbar  spondylosis.    PATIENT SURVEYS:  EVAL: FOTO 48.66%, 63% in 12 visits.  03/07/22: FOTO 75  PALPATION: Tenderness over hip (trochanter)    LOWER EXTREMITY ROM:   Active ROM Right eval Left eval Left/Right in degrees  Hip flexion [facility] 90/75  Hip internal rotation Severe  limitation P! Severe limitation 8/8  Hip external rotation Severe limitation P! Severe limitation 21/18  Knee flexion [facility] WFL P!   Knee extension [facility] WFL    (Blank rows = not tested) 02/10/2022  Hamstrings: 45/45 degrees  LOWER EXTREMITY MMT:   MMT Right eval Left eval  Hip flexion 5 5  Knee flexion 5 4-  Knee extension 5 4-   (Blank rows = not tested)   LOWER EXTREMITY SPECIAL TESTS:  Hip special tests: Ober's test: negative and Hip scouring test: negative   FUNCTIONAL TESTS:  Assess next session   GAIT: Distance walked: 75f Assistive device utilized: None Level of assistance: Complete Independence Comments: Pt walks with antalgic gait due to pain.      TODAY'S TREATMENT:   03/07/22 TherEx NuStep L5 x 8 min SKTC 3x20 sec bil Figure-4 piriformis stretch supine 3x20 sec  Bridge 10 reps x 5 sec Hooklying single limb clamshell 2x10 bil with L4 band  Self-Care Discussed where to locally purchase TENS unit as well as application of electrodes.  Pt likely to purchase and bring to next visit  02/10/2022: Reviewed home exercise program from day 1          Supine hip flexors stretch with 1 leg off table and the other leg bent 4 x 20 seconds           Single knee-to-chest stretch with other leg straight 4 x 20 seconds                                     Figure 4 stretch 4 x 20 seconds right side only Gluteal stretch 4 x 20 seconds Standing lumbar/hip extension 10 x 3 seconds Standing hip  hike in doorway 2 sets of 5 for 3 seconds Standing hip hike at treatment table 2 sets of 10 for 3 seconds                                                                     DATE: Creating, reviewing, and completing below HEP      PATIENT EDUCATION:  Education details: Educated pt on anatomy and physiology of current symptoms, FOTO, diagnosis, prognosis, HEP,  and POC. Person educated: Patient Education method: ECustomer service managerEducation comprehension: verbalized understanding and returned demonstration   HOME EXERCISE PROGRAM: Access Code: [email or web address] URL: [email or web address] Date: 02/10/2022 Prepared by: RVista Mink Exercises - Supine Active Straight Leg Raise  - 2 x daily - 7 x weekly - 2 sets - 10 reps - Supine Hip Adduction Isometric with Ball  - 2 x daily - 7 x weekly - 2 sets - 10 reps - Standing Lumbar Extension at [employer] - 5 x daily - 7 x weekly - 1 sets - 5 reps - 3 seconds hold - Single Knee to Chest Stretch  - 2-3 x daily - 7 x weekly - 1 sets - 5 reps - 20 seconds hold - Supine Figure 4 Piriformis Stretch  - 2-3 x daily - 7 x weekly - 1 sets - 5 reps - 20 seconds hold - Standing Hip Hiking  - 10 x daily - 7 x weekly -  1 sets - 10 reps - 3 seconds hold   ASSESSMENT:   CLINICAL IMPRESSION: Pt has met FOTO goal and overall doing really well with PT.  Anticipate we will d/c to home program next few visits.  Will continue to benefit from PT to maximize function.    OBJECTIVE IMPAIRMENTS: decreased activity tolerance, difficulty walking, decreased balance, decreased endurance, decreased mobility, decreased ROM, decreased strength, impaired flexibility, impaired LE use, postural dysfunction, and pain.   ACTIVITY LIMITATIONS: bending, lifting, carry, locomotion, cleaning, community activity, driving, and or occupation   PERSONAL FACTORS: COPD, Anxiety, Depression, DJD, vitamin D deficiency  are also affecting patient's functional  outcome.   REHAB POTENTIAL: Good   CLINICAL DECISION MAKING: Evolving/moderate complexity   EVALUATION COMPLEXITY: Moderate       GOALS: Short term PT Goals Target date: 03/07/2022 Pt will be I and compliant with HEP. Baseline:  Goal status: MET 03/07/22 Pt will decrease pain by 25% overall Baseline: Goal status: MET 03/07/22   Long term PT goals Target date: 04/18/2022 Pt will improve ROM to [facility] to improve functional mobility Baseline: Goal status: On Going 02/10/2022 Pt will improve knee strength to at least 5-/5 MMT to improve functional strength Baseline: Goal status: New Pt will improve FOTO to at least 63% functional to show improved function Baseline: Goal status: MET 03/07/22 Pt will reduce pain by overall 50% overall with usual activity Baseline: Goal status: On Going 03/07/22 Pt will reduce pain to overall less than 2-3/10 with usual activity and work activity. Baseline: Goal status:  On Going 02/10/2022 Pt will be able to ambulate community distances at least 1000 ft WNL gait pattern without complaints Baseline: Goal status: On Going 03/07/22   PLAN: PT FREQUENCY: 1-3 times per week    PT DURATION: 6-8 weeks   PLANNED INTERVENTIONS (unless contraindicated): aquatic PT, Canalith repositioning, cryotherapy, Electrical stimulation, Iontophoresis with 4 mg/ml dexamethasome, Moist heat, traction, Ultrasound, gait training, Therapeutic exercise, balance training, neuromuscular re-education, patient/family education, prosthetic training, manual techniques, passive ROM, dry needling, taping, vasopnuematic device, vestibular, spinal manipulations, joint manipulations   PLAN FOR NEXT SESSION: check strength, TENS unit application if pt purchased,  Assess HEP/update PRN, continue to progress functional mobility, strengthen proximal hip muscles with emphasis on hip abductors/quadriceps.  Decrease patient's pain and help minimize functional deficits.      Laureen Abrahams, PT,  DPT 03/07/22 12:02 PM

## 2022-03-08 ENCOUNTER — Ambulatory Visit (INDEPENDENT_AMBULATORY_CARE_PROVIDER_SITE_OTHER): Payer: No Typology Code available for payment source

## 2022-03-08 VITALS — BP 130/76 | HR 73

## 2022-03-08 DIAGNOSIS — Z09 Encounter for follow-up examination after completed treatment for conditions other than malignant neoplasm: Secondary | ICD-10-CM

## 2022-03-08 NOTE — Progress Notes (Signed)
Patient presents today for post op visit # 2, patient of Dr. Paulla Dolly.   POV # 2 DOS 02/14/22    Patient presents in their surgical shoe. Denies any falls or injury to the foot. Foot is slightly swollen. No signs of infection. No calf pain or shortness of breath. Bandages dry and intact. Incision is intact.  T  BP: 130/76 P: 73    Xrays taken today and reviewed by Dr. Paulla Dolly. He did take a look at her foot today as well.   Foot redressed today and placed back in the surgical shoe. Reviewed icing and elevation. Patient will follow up as needed.

## 2022-03-14 ENCOUNTER — Ambulatory Visit: Payer: No Typology Code available for payment source | Admitting: Physical Therapy

## 2022-03-14 ENCOUNTER — Encounter: Payer: Self-pay | Admitting: Physical Therapy

## 2022-03-14 DIAGNOSIS — R600 Localized edema: Secondary | ICD-10-CM | POA: Diagnosis not present

## 2022-03-14 DIAGNOSIS — M25551 Pain in right hip: Secondary | ICD-10-CM | POA: Diagnosis not present

## 2022-03-14 DIAGNOSIS — G8929 Other chronic pain: Secondary | ICD-10-CM

## 2022-03-14 DIAGNOSIS — R262 Difficulty in walking, not elsewhere classified: Secondary | ICD-10-CM | POA: Diagnosis not present

## 2022-03-14 DIAGNOSIS — M25562 Pain in left knee: Secondary | ICD-10-CM

## 2022-03-14 DIAGNOSIS — M6281 Muscle weakness (generalized): Secondary | ICD-10-CM

## 2022-03-14 NOTE — Therapy (Signed)
OUTPATIENT PHYSICAL THERAPY TREATMENT NOTE DISCHARGE SUMMARY   Patient Name: Marilyn [name] MRN: [id number] DOB:05-17-1955, 67 y.o., female Today's Date: 03/14/2022  END OF SESSION:   PT End of Session - 03/14/22 1051     Visit Number 4    Number of Visits 16    Date for PT Re-Evaluation 04/18/22    PT Start Time 1055    PT Stop Time 3329    PT Time Calculation (min) 48 min    Activity Tolerance Patient tolerated treatment well    Behavior During Therapy [facility] for tasks assessed/performed               Past Medical History:  Diagnosis Date   Allergy    Anemia    hx   Anxiety    panic disorder   Breast cancer ([place]) 06/06/2011   bc  left breast 3 o'clock dx=invasive ductal ca uoqER/PR=positive   Cancer (HCC)    BREAST - left   Cigarette smoker    Colon polyp    Complication of anesthesia    DIFFICULTY AWAKENING, BP INCREASE AFTER CHOLECYSTECTEMY    COPD (chronic obstructive pulmonary disease) (HCC)    Depression    Diverticulosis of colon    DJD (degenerative joint disease)    DJD (degenerative joint disease)    Family history of breast cancer    Family history of colon cancer    Family history of pancreatic cancer    Family history of prostate cancer    Gallstones    GERD (gastroesophageal reflux disease)    Pt. denies having GERD. Unable to remove it.   Headache(784.0)    History of anemia    History of bronchitis    History of sarcoidosis    Hyperlipidemia    no meds needed   Incisional breast wound    AT AGE 33 BILATERAL INCISION TO BREAST MADE   Leg swelling    Panic disorder    Personal history of radiation therapy    S/P radiation therapy 08/17/11 - 09/28/11   LLQ - 50 Gy/25 Fractions with Boost of 10 Gy / 5 fractions   Sialoadenitis of submandibular gland 2016   Vitamin D deficiency    Past Surgical History:  Procedure Laterality Date   BIOPSY BREAST     left breast  as a teenager  benign   BREAST BIOPSY Left 06/06/2011   BREAST  EXCISIONAL BIOPSY Bilateral    BREAST LUMPECTOMY Left 2013   BREAST SURGERY  07/05/11   left breast lumpectomy with needle loc & axillary sln bx   BUNIONECTOMY  2011   bilateral by Dr Little Ishikawa   CHOLECYSTECTOMY  02/2019   COLONOSCOPY     polyp   cyst removed from right hand  [place]  2000   Dr Gae Gallop FINGER RELEASE  2008   Dr Burney Gauze   Patient Active Problem List   Diagnosis Date Noted   Preop exam for internal medicine 02/09/2022   Plantar flexed metatarsal bone of right foot 01/11/2022   Grief 09/28/2021   Leg swelling 04/18/2021   Incisional breast wound 04/18/2021   History of sarcoidosis 04/18/2021   History of bronchitis 04/18/2021   GERD (gastroesophageal reflux disease) 04/18/2021   Family history of prostate cancer 04/18/2021   Depression 51/88/4166   Complication of anesthesia 04/18/2021   Colon polyp 04/18/2021   Cigarette smoker 04/18/2021  Cancer ([place]) 04/18/2021   Coronary atherosclerosis 01/30/2021   Atherosclerosis of aorta ([place]) 01/30/2021   Primary hypertension 01/14/2021   COPD mixed type ([place]) 09/30/2019   Knee pain, left 08/28/2019   Elevated BP without diagnosis of hypertension 08/28/2019   Callus of foot 08/28/2019   Cholelithiasis 12/26/2018   Genetic testing 12/26/2017   Family history of breast cancer    Family history of pancreatic cancer    Family history of colon cancer    Abdominal pain 07/28/2016   Tobacco use disorder 05/08/2016   Morbid obesity ([place]) 03/13/2016   Dyspnea 11/09/2015   Well adult exam 07/23/2014   Sialoadenitis 06/02/2014   S/P radiation therapy 2016   Edema 07/07/2013   AB (asthmatic bronchitis) 06/23/2013   Anxiety    Malignant neoplasm of upper-outer quadrant of left breast in female, estrogen receptor positive ([place]) 06/09/2011   Allergy 06/06/2011   COLONIC POLYPS 01/25/2010   DIVERTICULOSIS OF COLON 01/25/2010   DEGENERATIVE JOINT  DISEASE 11/06/2008   PANIC DISORDER 07/23/2008   Sarcoidosis 10/13/2007   Vitamin D deficiency 10/13/2007   ANEMIA 10/13/2007   BRONCHITIS 10/13/2007   Dyslipidemia 04/23/2007   Adjustment disorder with mixed anxiety and depressed mood 04/23/2007   Venous (peripheral) insufficiency 04/23/2007   HEADACHE 04/23/2007     THERAPY DIAG:  Difficulty in walking, not elsewhere classified  Muscle weakness (generalized)  Pain in right hip  Localized edema  Chronic pain of left knee  PCP: Plotnikov, Evie Lacks, MD   REFERRING PROVIDER: Leandrew Koyanagi, MD   REFERRING DIAG: Right hip pain [M25.551]    EVAL THERAPY DIAG:  Difficulty in walking, not elsewhere classified - Plan: PT plan of care cert/re-cert   Muscle weakness (generalized) - Plan: PT plan of care cert/re-cert   Pain in right hip - Plan: PT plan of care cert/re-cert   Localized edema - Plan: PT plan of care cert/re-cert   Chronic pain of left knee - Plan: PT plan of care cert/re-cert   Rationale for Evaluation and Treatment: Rehabilitation   ONSET DATE: 2013     SUBJECTIVE:    SUBJECTIVE STATEMENT: Only feels pain going up stairs or lying flat and rolling onto Lt side  PERTINENT HISTORY: COPD, Anxiety, Depression, DJD, vitamin D deficiency  PAIN:  Are you having pain? Yes: NPRS scale: none currently/10 Pain location: R hip  Pain description: Sharp pain with intermittent shooting pain.  Aggravating factors: Weight bearing, stairs,  Relieving factors: Rest, biofreeze, ice.    PRECAUTIONS: None   WEIGHT BEARING RESTRICTIONS: No   FALLS:  Has patient fallen in last 6 months? No   LIVING ENVIRONMENT: Lives with: lives with their spouse Lives in: House/apartment Stairs: Yes: Internal: 6-8 steps; on right going up and External: 4 steps; bilateral but cannot reach both Has following equipment at home:  Chair to go up/down stairs   OCCUPATION: Retired    PLOF: Independent   PATIENT GOALS: Pt would like  to eliminate her pain     OBJECTIVE:    DIAGNOSTIC FINDINGS:  2 view x-rays of the lumbar spine shows no acute abnormalities.  There is mild intervertebral disc space narrowing between L5-S1 and L4-L5.  Lumbar  spondylosis.    PATIENT SURVEYS:  EVAL: FOTO 48.66%, 63% in 12 visits.  03/07/22: FOTO 75  PALPATION: Tenderness over hip (trochanter)    LOWER EXTREMITY ROM:   Active ROM Right eval Left eval Left/Right in degrees Rt/Lt 03/14/22  Hip flexion [facility] 90/75 97/96  Hip internal rotation Severe limitation P! Severe limitation 8/8 31/34  Hip external rotation Severe limitation P! Severe limitation 21/18 38/25  Knee flexion [facility] WFL P!    Knee extension [facility] WFL     (Blank rows = not tested) 02/10/2022  Hamstrings: 45/45 degrees  LOWER EXTREMITY MMT:   MMT Right eval Left eval Left 03/14/22  Hip flexion 5 5   Knee flexion 5 4- 5  Knee extension 5 4- 5   (Blank rows = not tested)   LOWER EXTREMITY SPECIAL TESTS:  Hip special tests: Ober's test: negative and Hip scouring test: negative   FUNCTIONAL TESTS:  Assess next session   GAIT: Distance walked: 3f Assistive device utilized: None Level of assistance: Complete Independence Comments: Pt walks with antalgic gait due to pain.     TODAY'S TREATMENT:   03/14/22 Modalities Set up home TENS unit with pt and instructed in application.  Utilized during therex today.  TherEx NuStep L5 x 8 min ROM and MMT as noted above Reinforced HEP and community fitness and working to incorporate some activity daily  03/07/22 TherEx NuStep L5 x 8 min SKTC 3x20 sec bil Figure-4 piriformis stretch supine 3x20 sec  Bridge 10 reps x 5 sec Hooklying single limb clamshell 2x10 bil with L4 band  Self-Care Discussed where to locally purchase TENS unit as well as application of electrodes.  Pt likely to purchase and bring to next visit  02/10/2022: Reviewed home exercise program from day 1          Supine hip flexors stretch  with 1 leg off table and the other leg bent 4 x 20 seconds           Single knee-to-chest stretch with other leg straight 4 x 20 seconds                                     Figure 4 stretch 4 x 20 seconds right side only Gluteal stretch 4 x 20 seconds Standing lumbar/hip extension 10 x 3 seconds Standing hip hike in doorway 2 sets of 5 for 3 seconds Standing hip hike at treatment table 2 sets of 10 for 3 seconds                                                                     DATE: Creating, reviewing, and completing below HEP      PATIENT EDUCATION:  Education details: Educated pt on anatomy and physiology of current symptoms, FOTO, diagnosis, prognosis, HEP,  and POC. Person educated: Patient Education method: ECustomer service managerEducation comprehension: verbalized understanding and returned demonstration   HOME EXERCISE PROGRAM: Access Code: [email or web address] URL: [email or web address] Date: 02/10/2022 Prepared by: RVista Mink Exercises - Supine Active Straight Leg Raise  - 2 x daily - 7 x weekly - 2 sets - 10 reps - Supine Hip Adduction Isometric with Ball  - 2 x daily - 7 x weekly - 2 sets - 10 reps - Standing Lumbar Extension at [place] - 5 x daily - 7 x weekly - 1 sets - 5 reps - 3 seconds hold - Single Knee to Chest Stretch  -  2-3 x daily - 7 x weekly - 1 sets - 5 reps - 20 seconds hold - Supine Figure 4 Piriformis Stretch  - 2-3 x daily - 7 x weekly - 1 sets - 5 reps - 20 seconds hold - Standing Hip Hiking  - 10 x daily - 7 x weekly - 1 sets - 10 reps - 3 seconds hold   ASSESSMENT:   CLINICAL IMPRESSION: Pt has met all goals and is ready for d/c today from PT and transition to community fitness.     OBJECTIVE IMPAIRMENTS: decreased activity tolerance, difficulty walking, decreased balance, decreased endurance, decreased mobility, decreased ROM, decreased strength, impaired flexibility, impaired LE use, postural dysfunction, and pain.    ACTIVITY LIMITATIONS: bending, lifting, carry, locomotion, cleaning, community activity, driving, and or occupation   PERSONAL FACTORS: COPD, Anxiety, Depression, DJD, vitamin D deficiency  are also affecting patient's functional outcome.   REHAB POTENTIAL: Good   CLINICAL DECISION MAKING: Evolving/moderate complexity   EVALUATION COMPLEXITY: Moderate       GOALS: Short term PT Goals Target date: 03/07/2022 Pt will be I and compliant with HEP. Baseline:  Goal status: MET 03/07/22 Pt will decrease pain by 25% overall Baseline: Goal status: MET 03/07/22   Long term PT goals Target date: 04/18/2022 Pt will improve ROM to [facility] to improve functional mobility Baseline: Goal status: MET 03/14/22 Pt will improve knee strength to at least 5-/5 MMT to improve functional strength Baseline: Goal status: MET 03/14/22 Pt will improve FOTO to at least 63% functional to show improved function Baseline: Goal status: MET 03/07/22 Pt will reduce pain by overall 50% overall with usual activity Baseline: Goal status: MET 03/14/22 Pt will reduce pain to overall less than 2-3/10 with usual activity and work activity. Baseline: Goal status:  MET 03/14/22 Pt will be able to ambulate community distances at least 1000 ft WNL gait pattern without complaints Baseline: Goal status: MET 03/14/22   PLAN: PT FREQUENCY: 1-3 times per week    PT DURATION: 6-8 weeks   PLANNED INTERVENTIONS (unless contraindicated): aquatic PT, Canalith repositioning, cryotherapy, Electrical stimulation, Iontophoresis with 4 mg/ml dexamethasome, Moist heat, traction, Ultrasound, gait training, Therapeutic exercise, balance training, neuromuscular re-education, patient/family education, prosthetic training, manual techniques, passive ROM, dry needling, taping, vasopnuematic device, vestibular, spinal manipulations, joint manipulations   PLAN FOR NEXT SESSION: d/c PT   Laureen Abrahams, PT, DPT 03/14/22 11:58 AM     PHYSICAL  THERAPY DISCHARGE SUMMARY  Visits from Start of Care: 4  Current functional level related to goals / functional outcomes: See above   Remaining deficits: See above   Education / Equipment: HEP, TENS   Patient agrees to discharge. Patient goals were met. Patient is being discharged due to meeting the stated rehab goals.  Laureen Abrahams, PT, DPT 03/14/22 11:58 AM  [facility] [address] [place], [place], [address] Phone: [phone]   Fax:  [phone]

## 2022-04-30 ENCOUNTER — Other Ambulatory Visit: Payer: Self-pay | Admitting: Cardiology

## 2022-05-01 ENCOUNTER — Ambulatory Visit: Payer: No Typology Code available for payment source | Admitting: Internal Medicine

## 2022-05-10 ENCOUNTER — Ambulatory Visit: Payer: No Typology Code available for payment source | Admitting: Internal Medicine

## 2022-05-10 ENCOUNTER — Encounter: Payer: Self-pay | Admitting: Internal Medicine

## 2022-05-10 VITALS — BP 110/78 | HR 75 | Temp 98.1°F | Ht 65.0 in | Wt 213.0 lb

## 2022-05-10 DIAGNOSIS — I2510 Atherosclerotic heart disease of native coronary artery without angina pectoris: Secondary | ICD-10-CM | POA: Diagnosis not present

## 2022-05-10 DIAGNOSIS — E559 Vitamin D deficiency, unspecified: Secondary | ICD-10-CM

## 2022-05-10 DIAGNOSIS — M7061 Trochanteric bursitis, right hip: Secondary | ICD-10-CM

## 2022-05-10 DIAGNOSIS — F330 Major depressive disorder, recurrent, mild: Secondary | ICD-10-CM

## 2022-05-10 DIAGNOSIS — F4321 Adjustment disorder with depressed mood: Secondary | ICD-10-CM

## 2022-05-10 DIAGNOSIS — R609 Edema, unspecified: Secondary | ICD-10-CM

## 2022-05-10 DIAGNOSIS — I2583 Coronary atherosclerosis due to lipid rich plaque: Secondary | ICD-10-CM

## 2022-05-10 DIAGNOSIS — E7800 Pure hypercholesterolemia, unspecified: Secondary | ICD-10-CM | POA: Insufficient documentation

## 2022-05-10 MED ORDER — HYDROXYZINE PAMOATE 25 MG PO CAPS: 25.0000 mg | ORAL_CAPSULE | Freq: Three times a day (TID) | ORAL | 1 refills | Status: DC | PRN

## 2022-05-10 MED ORDER — CETIRIZINE HCL 10 MG PO TABS: 10.0000 mg | ORAL_TABLET | Freq: Every day | ORAL | 3 refills | Status: DC

## 2022-05-10 NOTE — Assessment & Plan Note (Signed)
Father just died at 19 03/2022 Discussed

## 2022-05-10 NOTE — Assessment & Plan Note (Signed)
Grieving discussed  

## 2022-05-10 NOTE — Assessment & Plan Note (Signed)
Coronary calcium CT score of 79.6.  Pt stopped smoking  Pt declined statins. On fish oil

## 2022-05-10 NOTE — Patient Instructions (Signed)
Blue-Emu cream --use 2-3 times a day Get a memory foam matrass pad Hip opener exercises

## 2022-05-10 NOTE — Assessment & Plan Note (Signed)
Blue-Emu cream --use 2-3 times a day Get a memory foam matrass pad Hip opener exercises  Injection was offered

## 2022-05-10 NOTE — Assessment & Plan Note (Addendum)
Worse Reduce amlodipine to 5 mg/d; monitor BP

## 2022-05-10 NOTE — Assessment & Plan Note (Signed)
On Vit D 

## 2022-05-10 NOTE — Progress Notes (Signed)
Subjective:  Patient ID: Marilyn [name], female    DOB: 1955-12-24  Age: 67 y.o. MRN: [id number]  CC: Follow-up (4 [facility] F/U, b/l ankle swelling,nasal  congestion)   HPI Marilyn [name] presents for grieving - father just died at 15  F/u anxiety, asthma/COPD, OA  Outpatient Medications Prior to Visit  Medication Sig Dispense Refill   albuterol (VENTOLIN HFA) 108 (90 Base) MCG/ACT inhaler Inhale 2 puffs into the lungs every 6 (six) hours as needed for wheezing or shortness of breath. 18 g 5   ALPRAZolam (XANAX) 1 MG tablet Take 0.5-1 tablets (0.5-1 mg total) by mouth 3 (three) times daily as needed for anxiety. 90 tablet 2   amLODipine (NORVASC) 10 MG tablet TAKE 1 TABLET BY MOUTH EVERY DAY 90 tablet 1   budesonide-formoterol (SYMBICORT) 80-4.5 MCG/ACT inhaler Inhale 2 puffs into the lungs 2 (two) times daily. 1 each 11   busPIRone (BUSPAR) 30 MG tablet TAKE 1/2 TO 1 (ONE-HALF TO ONE) TABLET BY MOUTH TWICE DAILY 60 tablet 5   celecoxib (CELEBREX) 200 MG capsule Take 1 capsule (200 mg total) by mouth daily as needed for moderate pain. 30 capsule 3   Cholecalciferol 5000 UNITS TABS Take 1 tablet by mouth 3 (three) times a week.     clobetasol cream (TEMOVATE) [id number] % Apply 1 application. topically 2 (two) times daily. 60 g 3   diclofenac Sodium (VOLTAREN) 1 % GEL Apply 4 g topically 4 (four) times daily. (Patient taking differently: Apply 4 g topically as needed (Joint pain).)     ipratropium (ATROVENT) 0.03 % nasal spray Place 2 sprays into both nostrils 2 (two) times daily as needed for rhinitis. 30 mL 0   Multiple Vitamin (MULTIVITAMIN WITH MINERALS) TABS tablet Take 1 tablet by mouth daily. Unknown strength     triamcinolone ointment (KENALOG) 0.1 % Apply 1 Application topically 3 (three) times daily. 80 g 2   No facility-administered medications prior to visit.    ROS: Review of Systems  Constitutional:  Positive for fatigue. Negative for activity change, appetite  change, chills and unexpected weight change.  HENT:  Negative for congestion, mouth sores and sinus pressure.   Eyes:  Negative for visual disturbance.  Respiratory:  Negative for cough and chest tightness.   Gastrointestinal:  Negative for abdominal pain and nausea.  Genitourinary:  Negative for difficulty urinating, frequency and vaginal pain.  Musculoskeletal:  Positive for arthralgias. Negative for back pain and gait problem.  Skin:  Negative for pallor and rash.  Neurological:  Negative for dizziness, tremors, weakness, numbness and headaches.  Psychiatric/Behavioral:  Negative for confusion, sleep disturbance and suicidal ideas. The patient is nervous/anxious.     Objective:  BP 110/78 (BP Location: Left Arm, Patient Position: Sitting, Cuff Size: Large)   Pulse 75   Temp 98.1 F (36.7 C) (Oral)   Ht '5\' 5"'$  (1.651 m)   Wt 213 lb (96.6 kg)   SpO2 96%   BMI 35.45 kg/m   BP Readings from Last 3 Encounters:  05/10/22 110/78  03/08/22 130/76  02/09/22 130/78    Wt Readings from Last 3 Encounters:  05/10/22 213 lb (96.6 kg)  02/09/22 219 lb 3.2 oz (99.4 kg)  01/13/22 212 lb 11.2 oz (96.5 kg)    Physical Exam Constitutional:      General: She is not in acute distress.    Appearance: She is well-developed. She is obese.  HENT:     Head: Normocephalic.     Right  Ear: External ear normal.     Left Ear: External ear normal.     Nose: Nose normal.  Eyes:     General:        Right eye: No discharge.        Left eye: No discharge.     Conjunctiva/sclera: Conjunctivae normal.     Pupils: Pupils are equal, round, and reactive to light.  Neck:     Thyroid: No thyromegaly.     Vascular: No JVD.     Trachea: No tracheal deviation.  Cardiovascular:     Rate and Rhythm: Normal rate and regular rhythm.     Heart sounds: Normal heart sounds.  Pulmonary:     Effort: No respiratory distress.     Breath sounds: No stridor. No wheezing.  Abdominal:     General: Bowel sounds are  normal. There is no distension.     Palpations: Abdomen is soft. There is no mass.     Tenderness: There is no abdominal tenderness. There is no guarding or rebound.  Musculoskeletal:        General: No tenderness.     Cervical back: Normal range of motion and neck supple. No rigidity.  Lymphadenopathy:     Cervical: No cervical adenopathy.  Skin:    Findings: Rash present. No erythema.  Neurological:     Mental Status: She is oriented to person, place, and time.     Cranial Nerves: No cranial nerve deficit.     Motor: No abnormal muscle tone.     Coordination: Coordination normal.     Deep Tendon Reflexes: Reflexes normal.  Psychiatric:        Behavior: Behavior normal.        Thought Content: Thought content normal.        Judgment: Judgment normal.    Rash - very fine on arms R lat hip w/pain Trace ankle edema  Lab Results  Component Value Date   WBC 8.5 02/09/2022   HGB 12.0 02/09/2022   HCT 37.0 02/09/2022   PLT 267.0 02/09/2022   GLUCOSE 92 02/09/2022   CHOL 209 (H) 12/28/2020   TRIG 70.0 12/28/2020   HDL 69.20 12/28/2020   LDLDIRECT 102 (H) 10/14/2021   LDLCALC 126 (H) 12/28/2020   ALT 11 02/09/2022   AST 14 02/09/2022   NA 139 02/09/2022   K 4.0 02/09/2022   CL 106 02/09/2022   CREATININE 0.69 02/09/2022   BUN 9 02/09/2022   CO2 28 02/09/2022   TSH 1.25 12/28/2020   HGBA1C 6.2 02/09/2022    DG Chest 2 View  Result Date: 01/05/2022 CLINICAL DATA:  Dyspnea EXAM: CHEST - 2 VIEW COMPARISON:  03/10/2021 FINDINGS: Lungs are well expanded, symmetric, and clear. No pneumothorax or pleural effusion. Cardiac size within normal limits. Pulmonary vascularity is normal. Osseous structures are age-appropriate. No acute bone abnormality. Surgical clips seen within the a left breast, unchanged. IMPRESSION: 1. No active cardiopulmonary disease. Electronically Signed   By: Fidela Salisbury M.D.   On: 01/05/2022 17:26    Assessment & Plan:   Problem List Items Addressed  This Visit       Cardiovascular and Mediastinum   Coronary atherosclerosis    Coronary calcium CT score of 79.6.  Pt stopped smoking  Pt declined statins. On fish oil        Musculoskeletal and Integument   Trochanteric bursitis of right hip    Blue-Emu cream --use 2-3 times a day Get a memory foam matrass  pad Hip opener exercises  Injection was offered        Other   Vitamin D deficiency    On Vit D      Grief - Primary    Father just died at 38 2022/04/15 Discussed      Edema    Worse Reduce amlodipine to 5 mg/d; monitor BP      Depression    Grieving discussed      Relevant Medications   hydrOXYzine (VISTARIL) 25 MG capsule      Meds ordered this encounter  Medications   cetirizine (ZYRTEC ALLERGY) 10 MG tablet    Sig: Take 1 tablet (10 mg total) by mouth daily.    Dispense:  90 tablet    Refill:  3   hydrOXYzine (VISTARIL) 25 MG capsule    Sig: Take 1 capsule (25 mg total) by mouth every 8 (eight) hours as needed for itching.    Dispense:  90 capsule    Refill:  1      Follow-up: Return in about 3 months (around 08/10/2022) for a follow-up visit.  Walker Kehr, MD

## 2022-05-18 ENCOUNTER — Ambulatory Visit: Payer: No Typology Code available for payment source | Admitting: Internal Medicine

## 2022-05-22 ENCOUNTER — Encounter: Payer: Self-pay | Admitting: Internal Medicine

## 2022-08-09 ENCOUNTER — Ambulatory Visit: Payer: No Typology Code available for payment source | Admitting: Internal Medicine

## 2022-08-09 ENCOUNTER — Encounter: Payer: Self-pay | Admitting: Internal Medicine

## 2022-08-09 VITALS — BP 118/80 | HR 64 | Temp 98.6°F | Ht 65.0 in | Wt 214.0 lb

## 2022-08-09 DIAGNOSIS — I2510 Atherosclerotic heart disease of native coronary artery without angina pectoris: Secondary | ICD-10-CM | POA: Diagnosis not present

## 2022-08-09 DIAGNOSIS — M7989 Other specified soft tissue disorders: Secondary | ICD-10-CM | POA: Diagnosis not present

## 2022-08-09 DIAGNOSIS — F419 Anxiety disorder, unspecified: Secondary | ICD-10-CM | POA: Diagnosis not present

## 2022-08-09 DIAGNOSIS — I2583 Coronary atherosclerosis due to lipid rich plaque: Secondary | ICD-10-CM

## 2022-08-09 DIAGNOSIS — J449 Chronic obstructive pulmonary disease, unspecified: Secondary | ICD-10-CM | POA: Diagnosis not present

## 2022-08-09 MED ORDER — AMLODIPINE BESYLATE 10 MG PO TABS: 5.0000 mg | ORAL_TABLET | Freq: Every day | ORAL | 1 refills | Status: DC

## 2022-08-09 MED ORDER — ALPRAZOLAM 1 MG PO TABS: 0.5000 mg | ORAL_TABLET | Freq: Three times a day (TID) | ORAL | 3 refills | Status: DC | PRN

## 2022-08-09 NOTE — Progress Notes (Signed)
Subjective:  Patient ID: Marilyn [name], female    DOB: February 17, 1956  Age: 67 y.o. MRN: [id number]  CC: Follow-up (3 [facility] F.U)   HPI Marilyn [name] presents for grief, anxiety, COPD A nephew died  Outpatient Medications Prior to Visit  Medication Sig Dispense Refill   albuterol (VENTOLIN HFA) 108 (90 Base) MCG/ACT inhaler Inhale 2 puffs into the lungs every 6 (six) hours as needed for wheezing or shortness of breath. 18 g 5   budesonide-formoterol (SYMBICORT) 80-4.5 MCG/ACT inhaler Inhale 2 puffs into the lungs 2 (two) times daily. 1 each 11   busPIRone (BUSPAR) 30 MG tablet TAKE 1/2 TO 1 (ONE-HALF TO ONE) TABLET BY MOUTH TWICE DAILY 60 tablet 5   celecoxib (CELEBREX) 200 MG capsule Take 1 capsule (200 mg total) by mouth daily as needed for moderate pain. 30 capsule 3   cetirizine (ZYRTEC ALLERGY) 10 MG tablet Take 1 tablet (10 mg total) by mouth daily. 90 tablet 3   Cholecalciferol 5000 UNITS TABS Take 1 tablet by mouth 3 (three) times a week.     clobetasol cream (TEMOVATE) 0.05 % Apply 1 application. topically 2 (two) times daily. 60 g 3   diclofenac Sodium (VOLTAREN) 1 % GEL Apply 4 g topically 4 (four) times daily. (Patient taking differently: Apply 4 g topically as needed (Joint pain).)     ipratropium (ATROVENT) 0.03 % nasal spray Place 2 sprays into both nostrils 2 (two) times daily as needed for rhinitis. 30 mL 0   Multiple Vitamin (MULTIVITAMIN WITH MINERALS) TABS tablet Take 1 tablet by mouth daily. Unknown strength     triamcinolone ointment (KENALOG) 0.1 % Apply 1 Application topically 3 (three) times daily. 80 g 2   ALPRAZolam (XANAX) 1 MG tablet Take 0.5-1 tablets (0.5-1 mg total) by mouth 3 (three) times daily as needed for anxiety. 90 tablet 2   amLODipine (NORVASC) 10 MG tablet TAKE 1 TABLET BY MOUTH EVERY DAY 90 tablet 1   hydrOXYzine (VISTARIL) 25 MG capsule Take 1 capsule (25 mg total) by mouth every 8 (eight) hours as needed for itching. 90 capsule 1    No facility-administered medications prior to visit.    ROS: Review of Systems  Constitutional:  Positive for fatigue. Negative for activity change, appetite change, chills and unexpected weight change.  HENT:  Negative for congestion, mouth sores and sinus pressure.   Eyes:  Negative for visual disturbance.  Respiratory:  Negative for cough and chest tightness.   Gastrointestinal:  Negative for abdominal pain and nausea.  Genitourinary:  Negative for difficulty urinating, frequency and vaginal pain.  Musculoskeletal:  Positive for back pain. Negative for gait problem.  Skin:  Negative for pallor and rash.  Neurological:  Negative for dizziness, tremors, weakness, numbness and headaches.  Psychiatric/Behavioral:  Negative for confusion and sleep disturbance. The patient is nervous/anxious.     Objective:  BP 118/80 (BP Location: Right Arm, Patient Position: Sitting, Cuff Size: Large)   Pulse 64   Temp 98.6 F (37 C) (Oral)   Ht 5\' 5"  (1.651 m)   Wt 214 lb (97.1 kg)   SpO2 95%   BMI 35.61 kg/m   BP Readings from Last 3 Encounters:  08/09/22 118/80  05/10/22 110/78  03/08/22 130/76    Wt Readings from Last 3 Encounters:  08/09/22 214 lb (97.1 kg)  05/10/22 213 lb (96.6 kg)  02/09/22 219 lb 3.2 oz (99.4 kg)    Physical Exam Constitutional:      General:  She is not in acute distress.    Appearance: She is well-developed. She is obese.  HENT:     Head: Normocephalic.     Right Ear: External ear normal.     Left Ear: External ear normal.     Nose: Nose normal.  Eyes:     General:        Right eye: No discharge.        Left eye: No discharge.     Conjunctiva/sclera: Conjunctivae normal.     Pupils: Pupils are equal, round, and reactive to light.  Neck:     Thyroid: No thyromegaly.     Vascular: No JVD.     Trachea: No tracheal deviation.  Cardiovascular:     Rate and Rhythm: Normal rate and regular rhythm.     Heart sounds: Normal heart sounds.  Pulmonary:      Effort: No respiratory distress.     Breath sounds: No stridor. No wheezing.  Abdominal:     General: Bowel sounds are normal. There is no distension.     Palpations: Abdomen is soft. There is no mass.     Tenderness: There is no abdominal tenderness. There is no guarding or rebound.  Musculoskeletal:        General: No tenderness.     Cervical back: Normal range of motion and neck supple. No rigidity.  Lymphadenopathy:     Cervical: No cervical adenopathy.  Skin:    Findings: No erythema or rash.  Neurological:     Mental Status: She is oriented to person, place, and time.     Cranial Nerves: No cranial nerve deficit.     Motor: No abnormal muscle tone.     Coordination: Coordination normal.     Deep Tendon Reflexes: Reflexes normal.  Psychiatric:        Behavior: Behavior normal.        Thought Content: Thought content normal.        Judgment: Judgment normal.     Lab Results  Component Value Date   WBC 8.5 02/09/2022   HGB 12.0 02/09/2022   HCT 37.0 02/09/2022   PLT 267.0 02/09/2022   GLUCOSE 92 02/09/2022   CHOL 209 (H) 12/28/2020   TRIG 70.0 12/28/2020   HDL 69.20 12/28/2020   LDLDIRECT 102 (H) 10/14/2021   LDLCALC 126 (H) 12/28/2020   ALT 11 02/09/2022   AST 14 02/09/2022   NA 139 02/09/2022   K 4.0 02/09/2022   CL 106 02/09/2022   CREATININE 0.69 02/09/2022   BUN 9 02/09/2022   CO2 28 02/09/2022   TSH 1.25 12/28/2020   HGBA1C 6.2 02/09/2022    DG Chest 2 View  Result Date: 01/05/2022 CLINICAL DATA:  Dyspnea EXAM: CHEST - 2 VIEW COMPARISON:  03/10/2021 FINDINGS: Lungs are well expanded, symmetric, and clear. No pneumothorax or pleural effusion. Cardiac size within normal limits. Pulmonary vascularity is normal. Osseous structures are age-appropriate. No acute bone abnormality. Surgical clips seen within the a left breast, unchanged. IMPRESSION: 1. No active cardiopulmonary disease. Electronically Signed   By: Helyn Numbers M.D.   On: 01/05/2022 17:26     Assessment & Plan:   Problem List Items Addressed This Visit     Anxiety - Primary    On Buspar po On Xanax prn  Potential benefits of a long term benzodiazepines  use as well as potential risks  and complications were explained to the patient and were aknowledged.      Relevant Medications  ALPRAZolam (XANAX) 1 MG tablet   COPD mixed type (HCC)    Coronary calcium CT score of 79.6.  Cont on Symbicort Get CXR, labs Surgical clearance - planning to have R foot surgery on 02/14/2022: she is clear for surgery      Coronary atherosclerosis    Coronary calcium CT score of 79.6.   Pt declined statins. On fish oil      Relevant Medications   amLODipine (NORVASC) 10 MG tablet   Leg swelling    We reduced Norvasc to 5 mg/d         Meds ordered this encounter  Medications   ALPRAZolam (XANAX) 1 MG tablet    Sig: Take 0.5-1 tablets (0.5-1 mg total) by mouth 3 (three) times daily as needed for anxiety.    Dispense:  90 tablet    Refill:  3   amLODipine (NORVASC) 10 MG tablet    Sig: Take 0.5 tablets (5 mg total) by mouth daily.    Dispense:  90 tablet    Refill:  1      Follow-up: Return in about 6 months (around 02/08/2023) for a follow-up visit.  Sonda Primes, MD

## 2022-08-09 NOTE — Assessment & Plan Note (Signed)
We reduced Norvasc to 5 mg/d

## 2022-08-09 NOTE — Assessment & Plan Note (Signed)
On Buspar po On Xanax prn  Potential benefits of a long term benzodiazepines  use as well as potential risks  and complications were explained to the patient and were aknowledged. 

## 2022-08-09 NOTE — Assessment & Plan Note (Signed)
Coronary calcium CT score of 79.6.   Pt declined statins. On fish oil

## 2022-08-09 NOTE — Assessment & Plan Note (Signed)
Coronary calcium CT score of 79.6.  Cont on Symbicort Get CXR, labs Surgical clearance - planning to have R foot surgery on 02/14/2022: she is clear for surgery 

## 2022-09-13 ENCOUNTER — Other Ambulatory Visit: Payer: Self-pay | Admitting: Obstetrics and Gynecology

## 2022-09-13 DIAGNOSIS — Z1231 Encounter for screening mammogram for malignant neoplasm of breast: Secondary | ICD-10-CM

## 2022-10-31 ENCOUNTER — Ambulatory Visit
Admission: RE | Admit: 2022-10-31 | Discharge: 2022-10-31 | Disposition: A | Discharge status: Home / Self Care | Payer: No Typology Code available for payment source | Source: Ambulatory Visit | Attending: Obstetrics and Gynecology | Admitting: Obstetrics and Gynecology

## 2022-10-31 DIAGNOSIS — Z1231 Encounter for screening mammogram for malignant neoplasm of breast: Secondary | ICD-10-CM

## 2022-11-11 ENCOUNTER — Other Ambulatory Visit: Payer: Self-pay | Admitting: Internal Medicine

## 2022-12-07 ENCOUNTER — Ambulatory Visit: Payer: No Typology Code available for payment source | Admitting: Orthopaedic Surgery

## 2022-12-07 ENCOUNTER — Other Ambulatory Visit (INDEPENDENT_AMBULATORY_CARE_PROVIDER_SITE_OTHER): Payer: Self-pay

## 2022-12-07 DIAGNOSIS — M5441 Lumbago with sciatica, right side: Secondary | ICD-10-CM | POA: Diagnosis not present

## 2022-12-07 MED ORDER — PREDNISONE 10 MG (21) PO TBPK: ORAL_TABLET | ORAL | 3 refills | Status: DC

## 2022-12-07 NOTE — Progress Notes (Signed)
Office Visit Note   Patient: Marilyn [name]           Date of Birth: [birth year]           MRN: [id number] Visit Date: 12/07/2022              Requested by: Tresa Garter, MD [address] [place],  [place] [address] PCP: Plotnikov, Georgina Quint, MD   Assessment & Plan: Visit Diagnoses:  1. Acute right-sided low back pain with right-sided sciatica     Plan: [name] is a 67 year old female with right hip and lower back pain.  I feel that this is probably coming from her back and not a recurrence of the trochanteric bursitis.  I would like to place her on a prednisone Dosepak for now.  If she does not feel any improvement she should follow-up for reevaluation.  Follow-Up Instructions: No follow-ups on file.   Orders:  Orders Placed This Encounter  Procedures   XR Lumbar Spine 2-3 Views   No orders of the defined types were placed in this encounter.     Procedures: No procedures performed   Clinical Data: No additional findings.   Subjective: Chief Complaint  Patient presents with   Right Hip - Pain    HPI Marilyn [name] is a 67 year old female comes in for right lower back pain.  She was moving 2 weeks ago.  She feels thigh pain but also has numbness that runs down to the foot.  Is worse with standing.  Denies any groin pain. Review of Systems  Constitutional: Negative.   HENT: Negative.    Eyes: Negative.   Respiratory: Negative.    Cardiovascular: Negative.   Endocrine: Negative.   Musculoskeletal: Negative.   Neurological: Negative.   Hematological: Negative.   Psychiatric/Behavioral: Negative.    All other systems reviewed and are negative.    Objective: Vital Signs: There were no vitals taken for this visit.  Physical Exam Vitals and nursing note reviewed.  Constitutional:      Appearance: She is well-developed.  HENT:     Head: Atraumatic.     Nose: Nose normal.  Eyes:     Extraocular Movements: Extraocular movements intact.   Cardiovascular:     Pulses: Normal pulses.  Pulmonary:     Effort: Pulmonary effort is normal.  Abdominal:     Palpations: Abdomen is soft.  Musculoskeletal:     Cervical back: Neck supple.  Skin:    General: Skin is warm.     Capillary Refill: Capillary refill takes less than 2 seconds.  Neurological:     Mental Status: She is alert. Mental status is at baseline.  Psychiatric:        Behavior: Behavior normal.        Thought Content: Thought content normal.        Judgment: Judgment normal.     Ortho Exam Exam of the right hip shows no significant tenderness to the trochanteric region.  Good range of motion of the hip without significant pain. Specialty Comments:  No specialty comments available.  Imaging: No results found.   PMFS History: Patient Active Problem List   Diagnosis Date Noted   Hypercholesterolemia 05/10/2022   Trochanteric bursitis of right hip 05/10/2022   Preop exam for internal medicine 02/09/2022   Plantar flexed metatarsal bone of right foot 01/11/2022   Grief 09/28/2021   Leg swelling 04/18/2021   Incisional breast wound 04/18/2021   History of sarcoidosis 04/18/2021   History  of bronchitis 04/18/2021   GERD (gastroesophageal reflux disease) 04/18/2021   Family history of prostate cancer 04/18/2021   Depression 04/18/2021   Complication of anesthesia 04/18/2021   Colon polyp 04/18/2021   Cigarette smoker 04/18/2021   Cancer (HCC) 04/18/2021   Coronary atherosclerosis 01/30/2021   Atherosclerosis of aorta (HCC) 01/30/2021   Primary hypertension 01/14/2021   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   Knee pain, left 08/28/2019   Elevated BP without diagnosis of hypertension 08/28/2019   Callus of foot 08/28/2019   Cholelithiasis 12/26/2018   Genetic testing 12/26/2017   Family history of breast cancer    Family history of pancreatic cancer    Family history of colon cancer    Abdominal pain 07/28/2016   Morbid obesity (HCC) 03/13/2016    Dyspnea 11/09/2015   Rash and nonspecific skin eruption 07/27/2015   Well adult exam 07/23/2014   Sialoadenitis 06/02/2014   Edema 07/07/2013   AB (asthmatic bronchitis) 06/23/2013   Anxiety    Malignant neoplasm of upper-outer quadrant of left breast in female, 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   Allergy 06/06/2011   COLONIC POLYPS 01/25/2010   DIVERTICULOSIS OF COLON 01/25/2010   DEGENERATIVE JOINT DISEASE 11/06/2008   PANIC DISORDER 07/23/2008   Sarcoidosis 10/13/2007   Vitamin D deficiency 10/13/2007   ANEMIA 10/13/2007   BRONCHITIS 10/13/2007   Dyslipidemia 04/23/2007   Adjustment disorder with mixed anxiety and depressed mood 04/23/2007   Venous (peripheral) insufficiency 04/23/2007   HEADACHE 04/23/2007   Past Medical History:  Diagnosis Date   Allergy    Anemia    hx   Anxiety    panic disorder   Breast cancer (HCC) 06/06/2011   bc  left breast 3 o'clock dx=invasive ductal ca uoqER/PR=positive   Cancer (HCC)    BREAST - left   Cigarette smoker    Colon polyp    Complication of anesthesia    DIFFICULTY AWAKENING, BP INCREASE AFTER CHOLECYSTECTEMY    COPD (chronic obstructive pulmonary disease) (HCC)    Depression    Diverticulosis of colon    DJD (degenerative joint disease)    DJD (degenerative joint disease)    Family history of breast cancer    Family history of colon cancer    Family history of pancreatic cancer    Family history of prostate cancer    Gallstones    GERD (gastroesophageal reflux disease)    Pt. denies having GERD. Unable to remove it.   Headache(784.0)    History of anemia    History of bronchitis    History of sarcoidosis    Hyperlipidemia    no meds needed   Incisional breast wound    AT AGE 57 BILATERAL INCISION TO BREAST MADE   Leg swelling    Panic disorder    Personal history of radiation therapy    S/P radiation therapy 08/17/11 - 09/28/11   LLQ - 50 Gy/25 Fractions with Boost of 10 Gy / 5 fractions   Sialoadenitis  of submandibular gland 2016   Vitamin D deficiency     Family History  Problem Relation Age of Onset   Dementia Mother    Hypertension Mother    Anemia Mother    Other Mother        + H Pylori   Seizures Mother    Hypertension Father    Prostate cancer Father        dx over 21   Colon cancer Father 44  diagnosed 2009   Bladder Cancer Father        dx over 64   Colon polyps Father    Hypertension Sister    Breast cancer Sister        dx in her 30s; mat half sister   Pancreatic cancer Sister 59       d. 66   Lung cancer Sister    Hypertension Sister    Sarcoidosis Sister        mat 1/2 sister   Multiple sclerosis Brother        #2   Hypertension Brother        #1   Hyperlipidemia Brother    Prostate cancer Brother        dx under 50   Dementia Maternal Aunt    Dementia Maternal Aunt    Seizures Maternal Uncle    Lung cancer Maternal Uncle    Lung cancer Paternal Aunt    Breast cancer Cousin        pat first cousin d. 61   Sarcoidosis Cousin        mat first cousin   Breast cancer Cousin        maternal 2nd cousins - distant   Brain cancer Other        benign   Esophageal cancer Neg Hx    Stomach cancer Neg Hx    Rectal cancer Neg Hx     Past Surgical History:  Procedure Laterality Date   BIOPSY BREAST     left breast  as a teenager  benign   BREAST BIOPSY Left 06/06/2011   BREAST EXCISIONAL BIOPSY Bilateral    BREAST LUMPECTOMY Left 2013   BREAST SURGERY  07/05/11   left breast lumpectomy with needle loc & axillary sln bx   BUNIONECTOMY  2011   bilateral by Dr Celene Skeen   CHOLECYSTECTOMY  02/2019   COLONOSCOPY     polyp   cyst removed from right hand  1995   POLYPECTOMY     TONSILLECTOMY  1972   TOTAL ABDOMINAL HYSTERECTOMY  2000   Dr Sharalyn Ink FINGER RELEASE  2008   Dr Mina Marble   Social History   Occupational History   Occupation: retired  Tobacco Use   Smoking status: Former    Current packs/day: 0.00    Types: Cigarettes    Quit  date: 12/28/2020    Years since quitting: 1.9   Smokeless tobacco: Never   Tobacco comments:    STOPPED AND RESTARTED  Vaping Use   Vaping status: Never Used  Substance and Sexual Activity   Alcohol use: Yes    Alcohol/week: 0.0 - 1.0 standard drinks of alcohol    Comment: social   Drug use: No   Sexual activity: Not Currently    Birth control/protection: Surgical    Comment: menses age 89,hrt started  2000

## 2022-12-22 ENCOUNTER — Other Ambulatory Visit: Payer: Self-pay | Admitting: Internal Medicine

## 2023-01-04 ENCOUNTER — Ambulatory Visit: Payer: No Typology Code available for payment source | Admitting: Pulmonary Disease

## 2023-01-04 ENCOUNTER — Encounter: Payer: Self-pay | Admitting: Pulmonary Disease

## 2023-01-04 VITALS — BP 140/70 | HR 74 | Ht 65.0 in | Wt 226.0 lb

## 2023-01-04 DIAGNOSIS — D869 Sarcoidosis, unspecified: Secondary | ICD-10-CM

## 2023-01-04 DIAGNOSIS — Z23 Encounter for immunization: Secondary | ICD-10-CM | POA: Diagnosis not present

## 2023-01-04 MED ORDER — ALBUTEROL SULFATE HFA 108 (90 BASE) MCG/ACT IN AERS
2.0000 | INHALATION_SPRAY | Freq: Four times a day (QID) | RESPIRATORY_TRACT | 6 refills | Status: AC | PRN
Start: 2023-01-04 — End: ?

## 2023-01-04 NOTE — Addendum Note (Signed)
Addended by: Carnella Guadalajara on: 01/04/2023 03:58 PM   Modules accepted: Orders

## 2023-01-04 NOTE — Patient Instructions (Addendum)
Administer flu shot  Obtain CRP, ESR, ACE level  Pulmonary function test  CT scan of the chest with contrast  Continue Symbicort  Will place a prescription for albuterol-you do not have to pick it up if it is too expensive  Follow-up in 2 to 3 months  Call us with significant concerns

## 2023-01-04 NOTE — Progress Notes (Signed)
Marilyn [name]    [id number]    1955/03/14  Primary Care Physician:Plotnikov, Georgina Quint, MD  Referring Physician: Tresa Garter, MD [address] [place],  [place] [address]  Chief complaint:   Cough, shortness of breath  HPI:  History of sarcoidosis diagnosed over 6 7 years ago  More shortness of breath, cough, worse at night Brings up some phlegm in the morning  Has been using Symbicort, was not using it regularly at some point but is back to using it regularly  Was recently treated with a course of steroids  Reformed smoker quit about 2 years ago  Denies any significant weight loss  Does have chronic musculoskeletal pain and discomfort  No pertinent occupational history  Outpatient Encounter Medications as of 01/04/2023  Medication Sig   albuterol (VENTOLIN HFA) 108 (90 Base) MCG/ACT inhaler Inhale 2 puffs into the lungs every 6 (six) hours as needed for wheezing or shortness of breath.   ALPRAZolam (XANAX) 1 MG tablet Take 0.5-1 tablets (0.5-1 mg total) by mouth 3 (three) times daily as needed for anxiety.   amLODipine (NORVASC) 10 MG tablet Take 0.5 tablets (5 mg total) by mouth daily.   budesonide-formoterol (SYMBICORT) 80-4.5 MCG/ACT inhaler Inhale 2 puffs into the lungs 2 (two) times daily.   busPIRone (BUSPAR) 30 MG tablet TAKE 1/2 TO 1 (ONE-HALF TO ONE) TABLET BY MOUTH TWICE DAILY   celecoxib (CELEBREX) 200 MG capsule TAKE 1 CAPSULE (200 MG TOTAL) BY MOUTH DAILY AS NEEDED FOR MODERATE PAIN.   cetirizine (ZYRTEC ALLERGY) 10 MG tablet Take 1 tablet (10 mg total) by mouth daily.   Cholecalciferol 5000 UNITS TABS Take 1 tablet by mouth 3 (three) times a week.   clobetasol cream (TEMOVATE) 0.05 % Apply 1 application. topically 2 (two) times daily.   diclofenac Sodium (VOLTAREN) 1 % GEL Apply 4 g topically 4 (four) times daily. (Patient taking differently: Apply 4 g topically as needed (Joint pain).)   ipratropium (ATROVENT) 0.03 % nasal  spray Place 2 sprays into both nostrils 2 (two) times daily as needed for rhinitis.   Multiple Vitamin (MULTIVITAMIN WITH MINERALS) TABS tablet Take 1 tablet by mouth daily. Unknown strength   predniSONE (STERAPRED UNI-PAK 21 TAB) 10 MG (21) TBPK tablet Take as directed   triamcinolone ointment (KENALOG) 0.1 % Apply 1 Application topically 3 (three) times daily.   [DISCONTINUED] pantoprazole (PROTONIX) 40 MG tablet Take 1 tablet (40 mg total) by mouth daily.   No facility-administered encounter medications on file as of 01/04/2023.    Allergies as of 01/04/2023 - Review Complete 01/04/2023  Allergen Reaction Noted   Lexapro [escitalopram oxalate]  08/02/2017   Microzide [hydrochlorothiazide]  01/31/2021   Penicillins Rash and Other (See Comments)     Past Medical History:  Diagnosis Date   Allergy    Anemia    hx   Anxiety    panic disorder   Breast cancer (HCC) 06/06/2011   bc  left breast 3 o'clock dx=invasive ductal ca uoqER/PR=positive   Cancer (HCC)    BREAST - left   Cigarette smoker    Colon polyp    Complication of anesthesia    DIFFICULTY AWAKENING, BP INCREASE AFTER CHOLECYSTECTEMY    COPD (chronic obstructive pulmonary disease) (HCC)    Depression    Diverticulosis of colon    DJD (degenerative joint disease)    DJD (degenerative joint disease)    Family history of breast cancer  Family history of colon cancer    Family history of pancreatic cancer    Family history of prostate cancer    Gallstones    GERD (gastroesophageal reflux disease)    Pt. denies having GERD. Unable to remove it.   Headache(784.0)    History of anemia    History of bronchitis    History of sarcoidosis    Hyperlipidemia    no meds needed   Incisional breast wound    AT AGE 26 BILATERAL INCISION TO BREAST MADE   Leg swelling    Panic disorder    Personal history of radiation therapy    S/P radiation therapy 08/17/11 - 09/28/11   LLQ - 50 Gy/25 Fractions with Boost of 10 Gy / 5  fractions   Sialoadenitis of submandibular gland 2016   Vitamin D deficiency     Past Surgical History:  Procedure Laterality Date   BIOPSY BREAST     left breast  as a teenager  benign   BREAST BIOPSY Left 06/06/2011   BREAST EXCISIONAL BIOPSY Bilateral    BREAST LUMPECTOMY Left 2013   BREAST SURGERY  07/05/11   left breast lumpectomy with needle loc & axillary sln bx   BUNIONECTOMY  2011   bilateral by Dr Celene Skeen   CHOLECYSTECTOMY  02/2019   COLONOSCOPY     polyp   cyst removed from right hand  1995   POLYPECTOMY     TONSILLECTOMY  1972   TOTAL ABDOMINAL HYSTERECTOMY  2000   Dr Sharalyn Ink FINGER RELEASE  2008   Dr Mina Marble    Family History  Problem Relation Age of Onset   Dementia Mother    Hypertension Mother    Anemia Mother    Other Mother        + H Pylori   Seizures Mother    Hypertension Father    Prostate cancer Father        dx over 79   Colon cancer Father 62       diagnosed 2009   Bladder Cancer Father        dx over 17   Colon polyps Father    Hypertension Sister    Breast cancer Sister        dx in her 30s; mat half sister   Pancreatic cancer Sister 45       d. 63   Lung cancer Sister    Hypertension Sister    Sarcoidosis Sister        mat 1/2 sister   Multiple sclerosis Brother        #2   Hypertension Brother        #1   Hyperlipidemia Brother    Prostate cancer Brother        dx under 50   Dementia Maternal Aunt    Dementia Maternal Aunt    Seizures Maternal Uncle    Lung cancer Maternal Uncle    Lung cancer Paternal Aunt    Breast cancer Cousin        pat first cousin d. 58   Sarcoidosis Cousin        mat first cousin   Breast cancer Cousin        maternal 2nd cousins - distant   Brain cancer Other        benign   Esophageal cancer Neg Hx    Stomach cancer Neg Hx    Rectal cancer Neg Hx     Social History  Socioeconomic History   Marital status: Married    Spouse name: Not on file   Number of children: 2   Years  of education: Not on file   Highest education level: Bachelor's degree (e.g., BA, AB, BS)  Occupational History   Occupation: retired  Tobacco Use   Smoking status: Former    Current packs/day: 0.00    Types: Cigarettes    Quit date: 12/28/2020    Years since quitting: 2.0   Smokeless tobacco: Never   Tobacco comments:    STOPPED AND RESTARTED  Vaping Use   Vaping status: Never Used  Substance and Sexual Activity   Alcohol use: Yes    Alcohol/week: 0.0 - 1.0 standard drinks of alcohol    Comment: social   Drug use: No   Sexual activity: Not Currently    Birth control/protection: Surgical    Comment: menses age 34,hrt started  January 09, 1999  Other Topics Concern   Not on file  Social History Narrative   Widowed - husband died 01-09-2004   2 step-children   Exercises 3x per week   2 cups of caffeine daily         Social Determinants of Health   Financial Resource Strain: Low Risk  (08/07/2022)   Overall Financial Resource Strain (CARDIA)    Difficulty of Paying Living Expenses: Not very hard  Food Insecurity: No Food Insecurity (08/07/2022)   Hunger Vital Sign    Worried About Running Out of Food in the Last Year: Never true    Ran Out of Food in the Last Year: Never true  Transportation Needs: No Transportation Needs (08/07/2022)   PRAPARE - Administrator, Civil Service (Medical): No    Lack of Transportation (Non-Medical): No  Physical Activity: Unknown (08/07/2022)   Exercise Vital Sign    Days of Exercise per Week: 0 days    Minutes of Exercise per Session: Not on file  Stress: No Stress Concern Present (08/07/2022)   [employer] of Occupational Health - Occupational Stress Questionnaire    Feeling of Stress : Not at all  Social Connections: Socially Integrated (08/07/2022)   Social Connection and Isolation Panel [NHANES]    Frequency of Communication with Friends and Family: Three times a week    Frequency of Social Gatherings with Friends and Family: Once a  week    Attends Religious Services: More than 4 times per year    Active Member of [employer] or Organizations: Yes    Attends Banker Meetings: 1 to 4 times per year    Marital Status: Married  Catering manager Violence: Not on file    Review of Systems  Respiratory:  Positive for cough and shortness of breath.     There were no vitals filed for this visit.   Physical Exam Constitutional:      Appearance: She is obese.  HENT:     Head: Normocephalic.     Mouth/Throat:     Mouth: Mucous membranes are moist.  Eyes:     General: No scleral icterus.    Pupils: Pupils are equal, round, and reactive to light.  Cardiovascular:     Rate and Rhythm: Normal rate and regular rhythm.     Heart sounds: No murmur heard.    No friction rub.  Pulmonary:     Effort: No respiratory distress.     Breath sounds: No stridor. No wheezing or rhonchi.  Musculoskeletal:     Cervical back: No rigidity or  tenderness.  Neurological:     Mental Status: She is alert.  Psychiatric:        Mood and Affect: Mood normal.    Data Reviewed: Dr. Jodelle Green records from 2019 reviewed  Last PFT was in 2017-no obstructive pathology  No recent CT scan of the chest   Assessment:  History of sarcoidosis  History of obstructive lung disease  Chronic cough  Shortness of breath on exertion Plan/Recommendations:  Obtain CT scan of the chest with contrast  Obtain pulmonary function test  Blood work including ACE, CRP, ESR  Continue Symbicort  Prescription for albuterol sent to pharmacy  May administer flu shot today  Follow-up in 2 to 3 months  Encouraged to call with significant concerns   Virl Diamond MD [facility] 01/04/2023, 3:06 PM  CC: Plotnikov, Georgina Quint, MD

## 2023-01-05 ENCOUNTER — Telehealth: Payer: Self-pay | Admitting: Pulmonary Disease

## 2023-01-05 ENCOUNTER — Other Ambulatory Visit (INDEPENDENT_AMBULATORY_CARE_PROVIDER_SITE_OTHER): Payer: No Typology Code available for payment source

## 2023-01-05 DIAGNOSIS — D869 Sarcoidosis, unspecified: Secondary | ICD-10-CM

## 2023-01-05 LAB — C-REACTIVE PROTEIN: CRP: <1 mg/dL (ref 0.5–20.0)

## 2023-01-05 LAB — SEDIMENTATION RATE: Sed Rate: 47 mm/h — ABNORMAL HIGH (ref 0–30)

## 2023-01-05 NOTE — Telephone Encounter (Signed)
Called and spoke with the patient, she states that the insurance company needs approval from our office for the Albuterol.  They went ahead and filled it for her, but she may not be able to get it refilled without a problem.  Advised I would call [facility] and find out what they need from our office.  She verbalized understanding.    I called [facility] and spoke with the staff.  I was advised that they did not need anything from our office.  She picked it up yesterday and it was $10.  Nothing further needed.

## 2023-01-05 NOTE — Telephone Encounter (Signed)
Patient states that she needs approval for her albuterol inhaler. The pharmacy will fill it and it is covered for her insurance.  Pharmacy: [facility] [address]

## 2023-01-08 LAB — ANGIOTENSIN CONVERTING ENZYME: Angiotensin-Converting Enzyme: 31 U/L (ref 9–67)

## 2023-01-15 ENCOUNTER — Other Ambulatory Visit: Payer: Self-pay

## 2023-01-15 ENCOUNTER — Inpatient Hospital Stay: Payer: No Typology Code available for payment source | Attending: Adult Health | Admitting: Adult Health

## 2023-01-15 ENCOUNTER — Encounter: Payer: Self-pay | Admitting: Adult Health

## 2023-01-15 VITALS — BP 144/74 | HR 87 | Temp 98.3°F | Resp 16 | Wt 229.2 lb

## 2023-01-15 DIAGNOSIS — Z8042 Family history of malignant neoplasm of prostate: Secondary | ICD-10-CM | POA: Insufficient documentation

## 2023-01-15 DIAGNOSIS — I2510 Atherosclerotic heart disease of native coronary artery without angina pectoris: Secondary | ICD-10-CM | POA: Insufficient documentation

## 2023-01-15 DIAGNOSIS — Z8052 Family history of malignant neoplasm of bladder: Secondary | ICD-10-CM | POA: Insufficient documentation

## 2023-01-15 DIAGNOSIS — Z818 Family history of other mental and behavioral disorders: Secondary | ICD-10-CM | POA: Insufficient documentation

## 2023-01-15 DIAGNOSIS — Z87891 Personal history of nicotine dependence: Secondary | ICD-10-CM | POA: Insufficient documentation

## 2023-01-15 DIAGNOSIS — F419 Anxiety disorder, unspecified: Secondary | ICD-10-CM | POA: Insufficient documentation

## 2023-01-15 DIAGNOSIS — Z803 Family history of malignant neoplasm of breast: Secondary | ICD-10-CM | POA: Diagnosis not present

## 2023-01-15 DIAGNOSIS — Z800 Family history of malignant neoplasm of digestive organs: Secondary | ICD-10-CM | POA: Diagnosis not present

## 2023-01-15 DIAGNOSIS — E7800 Pure hypercholesterolemia, unspecified: Secondary | ICD-10-CM | POA: Insufficient documentation

## 2023-01-15 DIAGNOSIS — R0602 Shortness of breath: Secondary | ICD-10-CM | POA: Insufficient documentation

## 2023-01-15 DIAGNOSIS — D869 Sarcoidosis, unspecified: Secondary | ICD-10-CM | POA: Insufficient documentation

## 2023-01-15 DIAGNOSIS — Z9049 Acquired absence of other specified parts of digestive tract: Secondary | ICD-10-CM | POA: Insufficient documentation

## 2023-01-15 DIAGNOSIS — Z801 Family history of malignant neoplasm of trachea, bronchus and lung: Secondary | ICD-10-CM | POA: Insufficient documentation

## 2023-01-15 DIAGNOSIS — C50412 Malignant neoplasm of upper-outer quadrant of left female breast: Secondary | ICD-10-CM | POA: Diagnosis present

## 2023-01-15 DIAGNOSIS — Z8249 Family history of ischemic heart disease and other diseases of the circulatory system: Secondary | ICD-10-CM | POA: Diagnosis not present

## 2023-01-15 DIAGNOSIS — Z83719 Family history of colon polyps, unspecified: Secondary | ICD-10-CM | POA: Insufficient documentation

## 2023-01-15 DIAGNOSIS — Z170 Estrogen receptor positive status [ER+]: Secondary | ICD-10-CM | POA: Diagnosis not present

## 2023-01-15 DIAGNOSIS — J4489 Other specified chronic obstructive pulmonary disease: Secondary | ICD-10-CM | POA: Diagnosis not present

## 2023-01-15 DIAGNOSIS — Z860100 Personal history of colon polyps, unspecified: Secondary | ICD-10-CM | POA: Insufficient documentation

## 2023-01-15 DIAGNOSIS — R0989 Other specified symptoms and signs involving the circulatory and respiratory systems: Secondary | ICD-10-CM | POA: Diagnosis not present

## 2023-01-15 DIAGNOSIS — Z79899 Other long term (current) drug therapy: Secondary | ICD-10-CM | POA: Diagnosis not present

## 2023-01-15 DIAGNOSIS — Z90710 Acquired absence of both cervix and uterus: Secondary | ICD-10-CM | POA: Diagnosis not present

## 2023-01-15 DIAGNOSIS — Z1721 Progesterone receptor positive status: Secondary | ICD-10-CM | POA: Insufficient documentation

## 2023-01-15 DIAGNOSIS — Z820 Family history of epilepsy and other diseases of the nervous system: Secondary | ICD-10-CM | POA: Insufficient documentation

## 2023-01-15 DIAGNOSIS — Z923 Personal history of irradiation: Secondary | ICD-10-CM | POA: Insufficient documentation

## 2023-01-15 DIAGNOSIS — Z83438 Family history of other disorder of lipoprotein metabolism and other lipidemia: Secondary | ICD-10-CM | POA: Insufficient documentation

## 2023-01-15 DIAGNOSIS — Z8349 Family history of other endocrine, nutritional and metabolic diseases: Secondary | ICD-10-CM | POA: Insufficient documentation

## 2023-01-15 DIAGNOSIS — Z832 Family history of diseases of the blood and blood-forming organs and certain disorders involving the immune mechanism: Secondary | ICD-10-CM | POA: Insufficient documentation

## 2023-01-15 NOTE — Assessment & Plan Note (Signed)
Niasha is a 67 year old with h/o stage IA left breast cancer diagnosed in 05/2011 s/p lumpectomy, adjuvant radiation therapy, and antiestrogen therapy x 5 years completed in 10/2016.    History of left breast cancer Recent mammogram was normal. No concerning findings on physical examination. -Continue annual mammograms.  Sarcoidosis Recurrence of symptoms including shortness of breath since July 2024. Patient has a history of long-term prednisone use. Currently under the care of a pulmonologist who has ordered a CT scan and pulmonary function tests. -Continue current management under pulmonologist.  COPD Patient reports shortness of breath, but it is unclear if this is due to COPD or sarcoidosis recurrence. -Continue current management under pulmonologist.  Diet and Exercise Patient reports plans to improve diet and increase physical activity. Provided patient with a recipe book for healthy eating. -Encourage patient to gradually increase physical activity. -Encourage patient to continue efforts to improve diet, focusing on home cooking and reducing fast food intake.  We will see Cosetta in one year for continued long term surveillance. She knows to call for any concerns that may arise between now and her next visit with [place].

## 2023-01-15 NOTE — Progress Notes (Signed)
[facility] Cancer Follow up:    [name], Marilyn Quint, MD [address] [place] [address]   DIAGNOSIS:  Cancer Staging  Malignant neoplasm of upper-outer quadrant of left breast in female, estrogen receptor positive (HCC) Staging form: Breast, AJCC 7th Edition - Clinical: Stage IA (T1c, N0, cM0) - Unsigned Laterality: Left Staging comments: Staged In Breast Conference  4.17.13  - Pathologic: No stage assigned - Unsigned Laterality: Left   SUMMARY OF ONCOLOGIC HISTORY: Oncology History Overview Note  ATM and MUTYH VUS.  Otherwise negative genetic testing   Malignant neoplasm of upper-outer quadrant of left breast in female, estrogen receptor positive (HCC)  06/20/2011 Surgery   Left breast lumpectomy with sentinel lymph node biopsy: 1 cm IDC, grade 1, ER positive, PR positive, HER-2 negative, Ki-67 6%   08/17/2011 - 09/28/2011 Radiation Therapy   Adjuvant radiation therapy   10/02/2011 - 11/23/2016 Anti-estrogen oral therapy   Arimidex 1 mg daily started 10/02/2011 stopped March 2015 for pain and restarted August 2015   12/21/2017 Genetic Testing   ATM c.3191T>C and MUTYH c.920G>A VUS identified on the common hereditary cancer panel.  The Hereditary Gene Panel offered by [facility] includes sequencing and/or deletion duplication testing of the following 47 genes: APC, ATM, AXIN2, BARD1, BMPR1A, BRCA1, BRCA2, BRIP1, CDH1, CDK4, CDKN2A (p14ARF), CDKN2A (p16INK4a), CHEK2, CTNNA1, DICER1, EPCAM (Deletion/duplication testing only), GREM1 (promoter region deletion/duplication testing only), KIT, MEN1, MLH1, MSH2, MSH3, MSH6, MUTYH, NBN, NF1, NHTL1, PALB2, PDGFRA, PMS2, POLD1, POLE, PTEN, RAD50, RAD51C, RAD51D, SDHB, SDHC, SDHD, SMAD4, SMARCA4. STK11, TP53, TSC1, TSC2, and VHL.  The following genes were evaluated for sequence changes only: SDHA and HOXB13 c.251G>A variant only. The report date is 12/21/2017.     CURRENT THERAPY: observation  INTERVAL  HISTORY:   Discussed the use of AI scribe software for clinical note transcription with the patient, who gave verbal consent to proceed.  Marilyn [name] 67 y.o. female returns for f/u of her history of breast cancer.  Her most recent mammogram 10/2022, which was reported as normal. She expresses concern about a palpable area in the breast, which she has been monitoring for changes. This area has been determined to be scar tissue and has been present since completing her adjuvant radiation in 2013.  She denies any associated pain, changes in size, or other concerning symptoms.  Marilyn [name] also has a history of sarcoidosis and COPD, presents with a recurrence of respiratory symptoms. She reports a return of shortness of breath that began in July, which she initially attributed to the emotional stress of multiple family losses. However, the symptoms persisted and increased with physical exertion, such as cleaning out her father's house. The patient has a history of long-term prednisone use for sarcoidosis in the 1980s and is now concerned about a possible relapse of the condition. She has sought consultation with a pulmonologist and has a CT scan and pulmonary function tests scheduled.  The patient acknowledges the need for lifestyle modifications, including increased physical activity and dietary changes. She expresses a willingness to start exercising and has begun making changes to her diet, with a focus on home-cooked meals. She also discusses her interest in incorporating smoothies into her diet.  The patient's narrative suggests a significant burden of chronic illness, with a focus on respiratory symptoms and concerns about breast health. She is proactive in seeking medical care and is motivated to make lifestyle changes to improve her overall health.   Patient Active Problem List   Diagnosis  Date Noted   Hypercholesterolemia 05/10/2022   Trochanteric bursitis of right hip 05/10/2022    Preop exam for internal medicine 02/09/2022   Plantar flexed metatarsal bone of right foot 01/11/2022   Grief 09/28/2021   Leg swelling 04/18/2021   Incisional breast wound 04/18/2021   History of sarcoidosis 04/18/2021   History of bronchitis 04/18/2021   GERD (gastroesophageal reflux disease) 04/18/2021   Family history of prostate cancer 04/18/2021   Depression 04/18/2021   Complication of anesthesia 04/18/2021   Colon polyp 04/18/2021   Cigarette smoker 04/18/2021   Cancer (HCC) 04/18/2021   Coronary atherosclerosis 01/30/2021   Atherosclerosis of aorta (HCC) 01/30/2021   Primary hypertension 01/14/2021   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   Knee pain, left 08/28/2019   Elevated BP without diagnosis of hypertension 08/28/2019   Callus of foot 08/28/2019   Cholelithiasis 12/26/2018   Genetic testing 12/26/2017   Family history of breast cancer    Family history of pancreatic cancer    Family history of colon cancer    Abdominal pain 07/28/2016   Morbid obesity (HCC) 03/13/2016   Dyspnea 11/09/2015   Rash and nonspecific skin eruption 07/27/2015   Well adult exam 07/23/2014   Sialoadenitis 06/02/2014   Edema 07/07/2013   AB (asthmatic bronchitis) 06/23/2013   Anxiety    Malignant neoplasm of upper-outer quadrant of left breast in female, 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   Allergy 06/06/2011   COLONIC POLYPS 01/25/2010   DIVERTICULOSIS OF COLON 01/25/2010   DEGENERATIVE JOINT DISEASE 11/06/2008   PANIC DISORDER 07/23/2008   Sarcoidosis 10/13/2007   Vitamin D deficiency 10/13/2007   ANEMIA 10/13/2007   BRONCHITIS 10/13/2007   Dyslipidemia 04/23/2007   Adjustment disorder with mixed anxiety and depressed mood 04/23/2007   Venous (peripheral) insufficiency 04/23/2007   HEADACHE 04/23/2007    is allergic to lexapro [escitalopram oxalate], microzide [hydrochlorothiazide], and penicillins.  MEDICAL HISTORY: Past Medical History:  Diagnosis Date   Allergy     Anemia    hx   Anxiety    panic disorder   Breast cancer (HCC) 06/06/2011   bc  left breast 3 o'clock dx=invasive ductal ca uoqER/PR=positive   Cancer (HCC)    BREAST - left   Cigarette smoker    Colon polyp    Complication of anesthesia    DIFFICULTY AWAKENING, BP INCREASE AFTER CHOLECYSTECTEMY    COPD (chronic obstructive pulmonary disease) (HCC)    Depression    Diverticulosis of colon    DJD (degenerative joint disease)    DJD (degenerative joint disease)    Family history of breast cancer    Family history of colon cancer    Family history of pancreatic cancer    Family history of prostate cancer    Gallstones    GERD (gastroesophageal reflux disease)    Pt. denies having GERD. Unable to remove it.   Headache(784.0)    History of anemia    History of bronchitis    History of sarcoidosis    Hyperlipidemia    no meds needed   Incisional breast wound    AT AGE 45 BILATERAL INCISION TO BREAST MADE   Leg swelling    Panic disorder    Personal history of radiation therapy    S/P radiation therapy 08/17/11 - 09/28/11   LLQ - 50 Gy/25 Fractions with Boost of 10 Gy / 5 fractions   Sialoadenitis of submandibular gland 2016   Vitamin D deficiency     SURGICAL HISTORY: Past Surgical  History:  Procedure Laterality Date   BIOPSY BREAST     left breast  as a teenager  benign   BREAST BIOPSY Left 06/06/2011   BREAST EXCISIONAL BIOPSY Bilateral    BREAST LUMPECTOMY Left 02-14-12   BREAST SURGERY  07/05/11   left breast lumpectomy with needle loc & axillary sln bx   BUNIONECTOMY  2010-02-13   bilateral by Dr Celene Skeen   CHOLECYSTECTOMY  02/2019   COLONOSCOPY     polyp   cyst removed from right hand  1995   POLYPECTOMY     TONSILLECTOMY  1972   TOTAL ABDOMINAL HYSTERECTOMY  02-14-1999   Dr Sharalyn Ink FINGER RELEASE  02-14-2007   Dr Mina Marble    SOCIAL HISTORY: Social History   Socioeconomic History   Marital status: Married    Spouse name: Not on file   Number of children: 2   Years  of education: Not on file   Highest education level: Bachelor's degree (e.g., BA, AB, BS)  Occupational History   Occupation: retired  Tobacco Use   Smoking status: Former    Current packs/day: 0.00    Types: Cigarettes    Quit date: 12/28/2020    Years since quitting: 2.0   Smokeless tobacco: Never   Tobacco comments:    STOPPED AND RESTARTED  Vaping Use   Vaping status: Never Used  Substance and Sexual Activity   Alcohol use: Yes    Alcohol/week: 0.0 - 1.0 standard drinks of alcohol    Comment: social   Drug use: No   Sexual activity: Not Currently    Birth control/protection: Surgical    Comment: menses age 22,hrt started  02/14/1999  Other Topics Concern   Not on file  Social History Narrative   Widowed - husband died 14-Feb-2004   2 step-children   Exercises 3x per week   2 cups of caffeine daily         Social Determinants of Health   Financial Resource Strain: Low Risk  (08/07/2022)   Overall Financial Resource Strain (CARDIA)    Difficulty of Paying Living Expenses: Not very hard  Food Insecurity: No Food Insecurity (08/07/2022)   Hunger Vital Sign    Worried About Running Out of Food in the Last Year: Never true    Ran Out of Food in the Last Year: Never true  Transportation Needs: No Transportation Needs (08/07/2022)   PRAPARE - Administrator, Civil Service (Medical): No    Lack of Transportation (Non-Medical): No  Physical Activity: Unknown (08/07/2022)   Exercise Vital Sign    Days of Exercise per Week: 0 days    Minutes of Exercise per Session: Not on file  Stress: No Stress Concern Present (08/07/2022)   [employer] of Occupational Health - Occupational Stress Questionnaire    Feeling of Stress : Not at all  Social Connections: Socially Integrated (08/07/2022)   Social Connection and Isolation Panel [NHANES]    Frequency of Communication with Friends and Family: Three times a week    Frequency of Social Gatherings with Friends and Family: Once a  week    Attends Religious Services: More than 4 times per year    Active Member of [employer] or Organizations: Yes    Attends Banker Meetings: 1 to 4 times per year    Marital Status: Married  Catering manager Violence: Not on file    FAMILY HISTORY: Family History  Problem Relation Age of Onset   Dementia  Mother    Hypertension Mother    Anemia Mother    Other Mother        + H Pylori   Seizures Mother    Hypertension Father    Prostate cancer Father        dx over 31   Colon cancer Father 63       diagnosed 2009   Bladder Cancer Father        dx over 9   Colon polyps Father    Hypertension Sister    Breast cancer Sister        dx in her 30s; mat half sister   Pancreatic cancer Sister 43       d. 63   Lung cancer Sister    Hypertension Sister    Sarcoidosis Sister        mat 1/2 sister   Multiple sclerosis Brother        #2   Hypertension Brother        #1   Hyperlipidemia Brother    Prostate cancer Brother        dx under 50   Dementia Maternal Aunt    Dementia Maternal Aunt    Seizures Maternal Uncle    Lung cancer Maternal Uncle    Lung cancer Paternal Aunt    Breast cancer Cousin        pat first cousin d. 31   Sarcoidosis Cousin        mat first cousin   Breast cancer Cousin        maternal 2nd cousins - distant   Brain cancer Other        benign   Esophageal cancer Neg Hx    Stomach cancer Neg Hx    Rectal cancer Neg Hx     Review of Systems  Constitutional:  Negative for appetite change, chills, fatigue, fever and unexpected weight change.  HENT:   Negative for hearing loss, lump/mass and trouble swallowing.   Eyes:  Negative for eye problems and icterus.  Respiratory:  Positive for shortness of breath. Negative for chest tightness and cough.   Cardiovascular:  Negative for chest pain, leg swelling and palpitations.  Gastrointestinal:  Negative for abdominal distention, abdominal pain, constipation, diarrhea, nausea and vomiting.   Endocrine: Negative for hot flashes.  Genitourinary:  Negative for difficulty urinating.   Musculoskeletal:  Negative for arthralgias.  Skin:  Negative for itching and rash.  Neurological:  Negative for dizziness, extremity weakness, headaches and numbness.  Hematological:  Negative for adenopathy. Does not bruise/bleed easily.  Psychiatric/Behavioral:  Negative for depression. The patient is not nervous/anxious.       PHYSICAL EXAMINATION    Vitals:   01/15/23 1103  BP: (!) 144/74  Pulse: 87  Resp: 16  Temp: 98.3 F (36.8 C)  SpO2: 96%    Physical Exam Constitutional:      General: She is not in acute distress.    Appearance: Normal appearance. She is not toxic-appearing.  HENT:     Head: Normocephalic and atraumatic.     Mouth/Throat:     Mouth: Mucous membranes are moist.     Pharynx: Oropharynx is clear. No oropharyngeal exudate or posterior oropharyngeal erythema.  Eyes:     General: No scleral icterus. Cardiovascular:     Rate and Rhythm: Normal rate and regular rhythm.     Pulses: Normal pulses.     Heart sounds: Normal heart sounds.  Pulmonary:     Effort: Pulmonary  effort is normal.     Breath sounds: Normal breath sounds.     Comments: Decreased air movement throughout  Chest:     Comments: Small amount of scar tissue noted over left lumpectomy site, no sign of local recurrence in left breast, right breast benign Abdominal:     General: Abdomen is flat. Bowel sounds are normal. There is no distension.     Palpations: Abdomen is soft.     Tenderness: There is no abdominal tenderness.  Musculoskeletal:        General: No swelling.     Cervical back: Neck supple.  Lymphadenopathy:     Cervical: No cervical adenopathy.     Upper Body:     Right upper body: No axillary adenopathy.     Left upper body: No axillary adenopathy.  Skin:    General: Skin is warm and dry.     Findings: No rash.  Neurological:     General: No focal deficit present.      Mental Status: She is alert.  Psychiatric:        Mood and Affect: Mood normal.        Behavior: Behavior normal.     LABORATORY DATA:  CBC    Component Value Date/Time   WBC 8.5 02/09/2022 1205   RBC 4.97 02/09/2022 1205   HGB 12.0 02/09/2022 1205   HGB 12.2 11/04/2013 0928   HCT 37.0 02/09/2022 1205   HCT 38.9 11/04/2013 0928   PLT 267.0 02/09/2022 1205   PLT 247 11/04/2013 0928   MCV 74.3 (L) 02/09/2022 1205   MCV 74.7 (L) 11/04/2013 0928   MCH 23.7 (L) 03/10/2021 1101   MCHC 32.4 02/09/2022 1205   RDW 14.7 02/09/2022 1205   RDW 14.7 (H) 11/04/2013 0928   LYMPHSABS 2.0 02/09/2022 1205   LYMPHSABS 1.8 11/04/2013 0928   MONOABS 0.7 02/09/2022 1205   MONOABS 0.5 11/04/2013 0928   EOSABS 0.3 02/09/2022 1205   EOSABS 0.2 11/04/2013 0928   BASOSABS 0.0 02/09/2022 1205   BASOSABS 0.0 11/04/2013 0928    CMP     Component Value Date/Time   NA 139 02/09/2022 1205   NA 138 11/04/2013 0929   K 4.0 02/09/2022 1205   K 3.8 11/04/2013 0929   CL 106 02/09/2022 1205   CL 107 03/22/2012 0853   CO2 28 02/09/2022 1205   CO2 23 11/04/2013 0929   GLUCOSE 92 02/09/2022 1205   GLUCOSE 100 11/04/2013 0929   GLUCOSE 99 03/22/2012 0853   BUN 9 02/09/2022 1205   BUN 12.1 11/04/2013 0929   CREATININE 0.69 02/09/2022 1205   CREATININE 0.8 11/04/2013 0929   CALCIUM 9.7 02/09/2022 1205   CALCIUM 9.6 11/04/2013 0929   PROT 7.1 02/09/2022 1205   PROT 7.2 11/04/2013 0929   ALBUMIN 4.1 02/09/2022 1205   ALBUMIN 3.6 11/04/2013 0929   AST 14 02/09/2022 1205   AST 12 11/04/2013 0929   ALT 11 02/09/2022 1205   ALT 13 11/04/2013 0929   ALKPHOS 81 02/09/2022 1205   ALKPHOS 108 11/04/2013 0929   BILITOT 0.4 02/09/2022 1205   BILITOT 0.28 11/04/2013 0929   GFRNONAA >60 03/10/2021 1101   GFRAA >60 07/14/2016 2305        ASSESSMENT and THERAPY PLAN:   Malignant neoplasm of upper-outer quadrant of left breast in female, estrogen receptor positive (HCC) Marilyn [name] is a 67 year old  with h/o stage IA left breast cancer diagnosed in 05/2011 s/p lumpectomy, adjuvant radiation therapy, and  antiestrogen therapy x 5 years completed in 10/2016.    History of left breast cancer Recent mammogram was normal. No concerning findings on physical examination. -Continue annual mammograms.  Sarcoidosis Recurrence of symptoms including shortness of breath since July 2024. Patient has a history of long-term prednisone use. Currently under the care of a pulmonologist who has ordered a CT scan and pulmonary function tests. -Continue current management under pulmonologist.  COPD Patient reports shortness of breath, but it is unclear if this is due to COPD or sarcoidosis recurrence. -Continue current management under pulmonologist.  Diet and Exercise Patient reports plans to improve diet and increase physical activity. Provided patient with a recipe book for healthy eating. -Encourage patient to gradually increase physical activity. -Encourage patient to continue efforts to improve diet, focusing on home cooking and reducing fast food intake.  We will see Marilyn [name] in one year for continued long term surveillance. She knows to call for any concerns that may arise between now and her next visit with [place].    All questions were answered. The patient knows to call the clinic with any problems, questions or concerns. We can certainly see the patient much sooner if necessary.  Total encounter time:30 minutes*in face-to-face visit time, chart review, lab review, care coordination, order entry, and documentation of the encounter time.    Lillard Anes, NP 01/15/23 12:02 PM Medical Oncology and Hematology [facility] [address] [place], [place] [address] Tel. [phone]    Fax. [phone]  *Total Encounter Time as defined by the Centers for Medicare and Medicaid Services includes, in addition to the face-to-face time of a patient visit (documented in the note above)  non-face-to-face time: obtaining and reviewing outside history, ordering and reviewing medications, tests or procedures, care coordination (communications with other health care professionals or caregivers) and documentation in the medical record.

## 2023-01-17 ENCOUNTER — Ambulatory Visit
Admission: RE | Admit: 2023-01-17 | Discharge: 2023-01-17 | Disposition: A | Discharge status: Home / Self Care | Payer: No Typology Code available for payment source | Source: Ambulatory Visit | Attending: Pulmonary Disease | Admitting: Pulmonary Disease

## 2023-01-17 DIAGNOSIS — D869 Sarcoidosis, unspecified: Secondary | ICD-10-CM

## 2023-01-17 MED ORDER — IOPAMIDOL (ISOVUE-370) INJECTION 76%
200.0000 mL | Freq: Once | INTRAVENOUS | Status: AC | PRN
  Administered 2023-01-17: 80 mL via INTRAVENOUS

## 2023-01-22 ENCOUNTER — Ambulatory Visit (HOSPITAL_COMMUNITY)
Admission: EM | Admit: 2023-01-22 | Discharge: 2023-01-22 | Disposition: A | Discharge status: Home / Self Care | Payer: Commercial Managed Care - PPO | Attending: Family Medicine | Admitting: Family Medicine

## 2023-01-22 ENCOUNTER — Encounter (HOSPITAL_COMMUNITY): Payer: Self-pay | Admitting: Emergency Medicine

## 2023-01-22 DIAGNOSIS — J069 Acute upper respiratory infection, unspecified: Secondary | ICD-10-CM

## 2023-01-22 DIAGNOSIS — J441 Chronic obstructive pulmonary disease with (acute) exacerbation: Secondary | ICD-10-CM | POA: Diagnosis not present

## 2023-01-22 LAB — POC COVID19/FLU A&B COMBO
Covid Antigen, POC: NEGATIVE
Influenza A Antigen, POC: NEGATIVE
Influenza B Antigen, POC: NEGATIVE

## 2023-01-22 MED ORDER — PREDNISONE 20 MG PO TABS: 40.0000 mg | ORAL_TABLET | Freq: Every day | ORAL | 0 refills | Status: AC

## 2023-01-22 MED ORDER — PROMETHAZINE-DM 6.25-15 MG/5ML PO SYRP: 5.0000 mL | ORAL_SOLUTION | Freq: Four times a day (QID) | ORAL | 0 refills | Status: DC | PRN

## 2023-01-22 NOTE — Discharge Instructions (Signed)
Your COVID and flu tests were negative.  Take prednisone 20 mg--2 daily for 3 days; this is for inflammation in your lungs  Take Phenergan with dextromethorphan syrup--5 mL or 1 teaspoon every 6 hours as needed for cough

## 2023-01-22 NOTE — ED Provider Notes (Signed)
[facility]    CSN: [id number] Arrival date & time: 01/22/23  0913      History   Chief Complaint Chief Complaint  Patient presents with   Cough   Shortness of Breath   Fever    HPI Marilyn [name] is a 67 y.o. female.    Cough Associated symptoms: fever and shortness of breath   Shortness of Breath Associated symptoms: cough and fever   Fever Associated symptoms: cough   Here for cough and congestion and possibly some fever and sweats.  Symptoms began on November 20.  She did start noticing some hoarseness and some mild sore throat 2 days ago.  She has had some wheezing and shortness of breath.  She does have a history of COPD and sarcoidosis.    Past Medical History:  Diagnosis Date   Allergy    Anemia    hx   Anxiety    panic disorder   Breast cancer (HCC) 06/06/2011   bc  left breast 3 o'clock dx=invasive ductal ca uoqER/PR=positive   Cancer (HCC)    BREAST - left   Cigarette smoker    Colon polyp    Complication of anesthesia    DIFFICULTY AWAKENING, BP INCREASE AFTER CHOLECYSTECTEMY    COPD (chronic obstructive pulmonary disease) (HCC)    Depression    Diverticulosis of colon    DJD (degenerative joint disease)    DJD (degenerative joint disease)    Family history of breast cancer    Family history of colon cancer    Family history of pancreatic cancer    Family history of prostate cancer    Gallstones    GERD (gastroesophageal reflux disease)    Pt. denies having GERD. Unable to remove it.   Headache(784.0)    History of anemia    History of bronchitis    History of sarcoidosis    Hyperlipidemia    no meds needed   Incisional breast wound    AT AGE 21 BILATERAL INCISION TO BREAST MADE   Leg swelling    Panic disorder    Personal history of radiation therapy    S/P radiation therapy 08/17/11 - 09/28/11   LLQ - 50 Gy/25 Fractions with Boost of 10 Gy / 5 fractions   Sialoadenitis of submandibular gland 2016   Vitamin D  deficiency     Patient Active Problem List   Diagnosis Date Noted   Hypercholesterolemia 05/10/2022   Trochanteric bursitis of right hip 05/10/2022   Preop exam for internal medicine 02/09/2022   Plantar flexed metatarsal bone of right foot 01/11/2022   Grief 09/28/2021   Leg swelling 04/18/2021   Incisional breast wound 04/18/2021   History of sarcoidosis 04/18/2021   History of bronchitis 04/18/2021   GERD (gastroesophageal reflux disease) 04/18/2021   Family history of prostate cancer 04/18/2021   Depression 04/18/2021   Complication of anesthesia 04/18/2021   Colon polyp 04/18/2021   Cigarette smoker 04/18/2021   Cancer (HCC) 04/18/2021   Coronary atherosclerosis 01/30/2021   Atherosclerosis of aorta (HCC) 01/30/2021   Primary hypertension 01/14/2021   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   Knee pain, left 08/28/2019   Elevated BP without diagnosis of hypertension 08/28/2019   Callus of foot 08/28/2019   Cholelithiasis 12/26/2018   Genetic testing 12/26/2017   Family history of breast cancer    Family history of pancreatic cancer    Family history of colon cancer    Abdominal pain 07/28/2016   Morbid obesity (  HCC) 03/13/2016   Dyspnea 11/09/2015   Rash and nonspecific skin eruption 07/27/2015   Well adult exam 07/23/2014   Sialoadenitis 06/02/2014   Edema 07/07/2013   AB (asthmatic bronchitis) 06/23/2013   Anxiety    Malignant neoplasm of upper-outer quadrant of left breast in female, 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   Allergy 06/06/2011   COLONIC POLYPS 01/25/2010   DIVERTICULOSIS OF COLON 01/25/2010   DEGENERATIVE JOINT DISEASE 11/06/2008   PANIC DISORDER 07/23/2008   Sarcoidosis 10/13/2007   Vitamin D deficiency 10/13/2007   ANEMIA 10/13/2007   BRONCHITIS 10/13/2007   Dyslipidemia 04/23/2007   Adjustment disorder with mixed anxiety and depressed mood 04/23/2007   Venous (peripheral) insufficiency 04/23/2007   HEADACHE 04/23/2007    Past Surgical  History:  Procedure Laterality Date   BIOPSY BREAST     left breast  as a teenager  benign   BREAST BIOPSY Left 06/06/2011   BREAST EXCISIONAL BIOPSY Bilateral    BREAST LUMPECTOMY Left 2013   BREAST SURGERY  07/05/11   left breast lumpectomy with needle loc & axillary sln bx   BUNIONECTOMY  2011   bilateral by Dr Celene Skeen   CHOLECYSTECTOMY  02/2019   COLONOSCOPY     polyp   cyst removed from right hand  1995   POLYPECTOMY     TONSILLECTOMY  1972   TOTAL ABDOMINAL HYSTERECTOMY  2000   Dr Sharalyn Ink FINGER RELEASE  2008   Dr Mina Marble    OB History   No obstetric history on file.    Obstetric Comments  Menarche age 53, nulliparity, HRT x 1 year, Hysterectomy          Home Medications    Prior to Admission medications   Medication Sig Start Date End Date Taking? Authorizing Provider  predniSONE (DELTASONE) 20 MG tablet Take 2 tablets (40 mg total) by mouth daily with breakfast for 5 days. 01/22/23 01/27/23 Yes Zenia Resides, MD  promethazine-dextromethorphan (PROMETHAZINE-DM) 6.25-15 MG/5ML syrup Take 5 mLs by mouth 4 (four) times daily as needed for cough. 01/22/23  Yes Zenia Resides, MD  albuterol (VENTOLIN HFA) 108 (90 Base) MCG/ACT inhaler Inhale 2 puffs into the lungs every 6 (six) hours as needed for wheezing or shortness of breath. 01/04/23   Tomma Lightning, MD  ALPRAZolam Prudy Feeler) 1 MG tablet Take 0.5-1 tablets (0.5-1 mg total) by mouth 3 (three) times daily as needed for anxiety. 08/09/22   Plotnikov, Georgina Quint, MD  amLODipine (NORVASC) 10 MG tablet Take 0.5 tablets (5 mg total) by mouth daily. 08/09/22   Plotnikov, Georgina Quint, MD  budesonide-formoterol (SYMBICORT) 80-4.5 MCG/ACT inhaler Inhale 2 puffs into the lungs 2 (two) times daily. 12/29/21   Plotnikov, Georgina Quint, MD  busPIRone (BUSPAR) 30 MG tablet TAKE 1/2 TO 1 (ONE-HALF TO ONE) TABLET BY MOUTH TWICE DAILY 12/22/22   Plotnikov, Georgina Quint, MD  celecoxib (CELEBREX) 200 MG capsule TAKE 1 CAPSULE (200  MG TOTAL) BY MOUTH DAILY AS NEEDED FOR MODERATE PAIN. 11/13/22   Plotnikov, Georgina Quint, MD  cetirizine (ZYRTEC ALLERGY) 10 MG tablet Take 1 tablet (10 mg total) by mouth daily. 05/10/22 05/10/23  Plotnikov, Georgina Quint, MD  Cholecalciferol 5000 UNITS TABS Take 1 tablet by mouth 3 (three) times a week.    [provider]  clobetasol cream (TEMOVATE) 0.05 % Apply 1 application. topically 2 (two) times daily. 06/28/21   Plotnikov, Georgina Quint, MD  diclofenac Sodium (VOLTAREN) 1 % GEL Apply 4 g topically 4 (  four) times daily. Patient taking differently: Apply 4 g topically as needed (Joint pain). 08/11/19   Lamptey, Britta Mccreedy, MD  ipratropium (ATROVENT) 0.03 % nasal spray Place 2 sprays into both nostrils 2 (two) times daily as needed for rhinitis. 07/27/20   Bing Neighbors, NP  Multiple Vitamin (MULTIVITAMIN WITH MINERALS) TABS tablet Take 1 tablet by mouth daily. Unknown strength    [provider]  triamcinolone ointment (KENALOG) 0.1 % Apply 1 Application topically 3 (three) times daily. 12/29/21   Plotnikov, Georgina Quint, MD  pantoprazole (PROTONIX) 40 MG tablet Take 1 tablet (40 mg total) by mouth daily. 04/28/19 08/11/19  Plotnikov, Georgina Quint, MD    Family History Family History  Problem Relation Age of Onset   Dementia Mother    Hypertension Mother    Anemia Mother    Other Mother        + H Pylori   Seizures Mother    Hypertension Father    Prostate cancer Father        dx over 51   Colon cancer Father 74       diagnosed 2009   Bladder Cancer Father        dx over 38   Colon polyps Father    Hypertension Sister    Breast cancer Sister        dx in her 30s; mat half sister   Pancreatic cancer Sister 78       d. 12   Lung cancer Sister    Hypertension Sister    Sarcoidosis Sister        mat 1/2 sister   Multiple sclerosis Brother        #2   Hypertension Brother        #1   Hyperlipidemia Brother    Prostate cancer Brother        dx under 50   Dementia Maternal Aunt     Dementia Maternal Aunt    Seizures Maternal Uncle    Lung cancer Maternal Uncle    Lung cancer Paternal Aunt    Breast cancer Cousin        pat first cousin d. 23   Sarcoidosis Cousin        mat first cousin   Breast cancer Cousin        maternal 2nd cousins - distant   Brain cancer Other        benign   Esophageal cancer Neg Hx    Stomach cancer Neg Hx    Rectal cancer Neg Hx     Social History Social History   Tobacco Use   Smoking status: Former    Current packs/day: 0.00    Types: Cigarettes    Quit date: 12/28/2020    Years since quitting: 2.0   Smokeless tobacco: Never   Tobacco comments:    STOPPED AND RESTARTED  Vaping Use   Vaping status: Never Used  Substance Use Topics   Alcohol use: Yes    Alcohol/week: 0.0 - 1.0 standard drinks of alcohol    Comment: social   Drug use: No     Allergies   Lexapro [escitalopram oxalate], Microzide [hydrochlorothiazide], and Penicillins   Review of Systems Review of Systems  Constitutional:  Positive for fever.  Respiratory:  Positive for cough and shortness of breath.      Physical Exam Triage Vital Signs ED Triage Vitals  Encounter Vitals Group     BP 01/22/23 0937 (!) 149/84  Systolic BP Percentile --      Diastolic BP Percentile --      Pulse Rate 01/22/23 0937 98     Resp 01/22/23 0937 19     Temp 01/22/23 0937 98.2 F (36.8 C)     Temp Source 01/22/23 0937 Oral     SpO2 01/22/23 0937 92 %     Weight --      Height --      Head Circumference --      Peak Flow --      Pain Score 01/22/23 0942 3     Pain Loc --      Pain Education --      Exclude from Growth Chart --    No data found.  Updated Vital Signs BP (!) 149/84 (BP Location: Right Arm)   Pulse 98   Temp 98.2 F (36.8 C) (Oral)   Resp 19   SpO2 92%   Visual Acuity Right Eye Distance:   Left Eye Distance:   Bilateral Distance:    Right Eye Near:   Left Eye Near:    Bilateral Near:     Physical Exam Vitals reviewed.   Constitutional:      General: She is not in acute distress.    Appearance: She is not ill-appearing, toxic-appearing or diaphoretic.  HENT:     Nose: Congestion present.     Mouth/Throat:     Mouth: Mucous membranes are moist.     Comments: There is white and clear exudate draining Eyes:     Extraocular Movements: Extraocular movements intact.     Conjunctiva/sclera: Conjunctivae normal.     Pupils: Pupils are equal, round, and reactive to light.  Cardiovascular:     Rate and Rhythm: Normal rate and regular rhythm.     Heart sounds: No murmur heard. Pulmonary:     Effort: No respiratory distress.     Breath sounds: No stridor. No wheezing, rhonchi or rales.  Musculoskeletal:     Cervical back: Neck supple.  Lymphadenopathy:     Cervical: No cervical adenopathy.  Skin:    Coloration: Skin is not jaundiced or pale.  Neurological:     General: No focal deficit present.     Mental Status: She is alert and oriented to person, place, and time.  Psychiatric:        Behavior: Behavior normal.      UC Treatments / Results  Labs (all labs ordered are listed, but only abnormal results are displayed) Labs Reviewed  POC COVID19/FLU A&B COMBO    EKG   Radiology No results found.  Procedures Procedures (including critical care time)  Medications Ordered in UC Medications - No data to display  Initial Impression / Assessment and Plan / UC Course  I have reviewed the triage vital signs and the nursing notes.  Pertinent labs & imaging results that were available during my care of the patient were reviewed by me and considered in my medical decision making (see chart for details).     Flu and COVID tests are negative.  Prednisone for 3 days is sent in for COPD exacerbation and Promethazine DM is sent in for cough.   Final Clinical Impressions(s) / UC Diagnoses   Final diagnoses:  Viral upper respiratory tract infection  COPD exacerbation (HCC)     Discharge  Instructions      Your COVID and flu tests were negative.  Take prednisone 20 mg--2 daily for 3 days; this is for inflammation  in your lungs  Take Phenergan with dextromethorphan syrup--5 mL or 1 teaspoon every 6 hours as needed for cough      ED Prescriptions     Medication Sig Dispense Auth. Provider   predniSONE (DELTASONE) 20 MG tablet Take 2 tablets (40 mg total) by mouth daily with breakfast for 5 days. 10 tablet Zenia Resides, MD   promethazine-dextromethorphan (PROMETHAZINE-DM) 6.25-15 MG/5ML syrup Take 5 mLs by mouth 4 (four) times daily as needed for cough. 118 mL Zenia Resides, MD      PDMP not reviewed this encounter.   Zenia Resides, MD 01/22/23 [phone]

## 2023-01-22 NOTE — ED Triage Notes (Signed)
Pt c/o fever, cough, sneezing, sob, and yellowish mucous for 5 days. Within last 3 days she has had sore throat

## 2023-01-24 ENCOUNTER — Telehealth: Payer: Self-pay | Admitting: Internal Medicine

## 2023-01-24 ENCOUNTER — Other Ambulatory Visit: Payer: Self-pay

## 2023-01-24 MED ORDER — BUDESONIDE-FORMOTEROL FUMARATE 80-4.5 MCG/ACT IN AERO: 2.0000 | INHALATION_SPRAY | Freq: Two times a day (BID) | RESPIRATORY_TRACT | 11 refills | Status: DC

## 2023-01-24 NOTE — Telephone Encounter (Signed)
Prescription Request  01/24/2023  LOV: 08/09/2022  What is the name of the medication or equipment? budesonide-formoterol (SYMBICORT) 80-4.5 MCG/ACT inhaler   Have you contacted your pharmacy to request a refill? No   Which pharmacy would you like this sent to?    [id number] [place], [place] - [address] [place] [address] Phone: [phone] Fax: [phone]  Please advise at Mobile [phone] (mobile)

## 2023-01-30 ENCOUNTER — Encounter (HOSPITAL_COMMUNITY): Payer: Self-pay

## 2023-01-30 ENCOUNTER — Ambulatory Visit (HOSPITAL_COMMUNITY)
Admission: EM | Admit: 2023-01-30 | Discharge: 2023-01-30 | Disposition: A | Discharge status: Home / Self Care | Payer: No Typology Code available for payment source | Attending: Family Medicine | Admitting: Family Medicine

## 2023-01-30 DIAGNOSIS — J019 Acute sinusitis, unspecified: Secondary | ICD-10-CM | POA: Diagnosis not present

## 2023-01-30 MED ORDER — BENZONATATE 100 MG PO CAPS: 100.0000 mg | ORAL_CAPSULE | Freq: Three times a day (TID) | ORAL | 0 refills | Status: DC | PRN

## 2023-01-30 MED ORDER — DOXYCYCLINE HYCLATE 100 MG PO CAPS: 100.0000 mg | ORAL_CAPSULE | Freq: Two times a day (BID) | ORAL | 0 refills | Status: AC

## 2023-01-30 NOTE — ED Triage Notes (Signed)
Pt c/o cough, sneezing, and chest tightness x2 days ago and worse today. States seen here and tx'd for same sx's last Monday.

## 2023-01-30 NOTE — Discharge Instructions (Signed)
Take doxycycline 100 mg --1 capsule 2 times daily for 7 days  Take benzonatate 100 mg, 1 tab every 8 hours as needed for cough.

## 2023-01-30 NOTE — ED Provider Notes (Signed)
[facility]    CSN: [id number] Arrival date & time: 01/30/23  1556      History   Chief Complaint Chief Complaint  Patient presents with   Cough    HPI Marilyn [name] is a 67 y.o. female.    Cough Here for cough and congestion.  She was seen about 8 days ago here and treated for a possible asthma exacerbation with some prednisone and Phenergan with dextromethorphan.  She states she did improve a good bit but was left with some cough and still with some mucus production and postnasal drainage.  Then 2 days ago she started feeling worse again with more congestion and sinus pressure.  She has had some shortness of breath but she states that it can be hard to differentiate that being from her lungs or from panic attacks.  She states today she took a Xanax and it did help the shortness of breath that she was having at the time.  She has not heard any wheezing  No fever that she can tell at this time.  Past Medical History:  Diagnosis Date   Allergy    Anemia    hx   Anxiety    panic disorder   Breast cancer (HCC) 06/06/2011   bc  left breast 3 o'clock dx=invasive ductal ca uoqER/PR=positive   Cancer (HCC)    BREAST - left   Cigarette smoker    Colon polyp    Complication of anesthesia    DIFFICULTY AWAKENING, BP INCREASE AFTER CHOLECYSTECTEMY    COPD (chronic obstructive pulmonary disease) (HCC)    Depression    Diverticulosis of colon    DJD (degenerative joint disease)    DJD (degenerative joint disease)    Family history of breast cancer    Family history of colon cancer    Family history of pancreatic cancer    Family history of prostate cancer    Gallstones    GERD (gastroesophageal reflux disease)    Pt. denies having GERD. Unable to remove it.   Headache(784.0)    History of anemia    History of bronchitis    History of sarcoidosis    Hyperlipidemia    no meds needed   Incisional breast wound    AT AGE 63 BILATERAL INCISION TO BREAST  MADE   Leg swelling    Panic disorder    Personal history of radiation therapy    S/P radiation therapy 08/17/11 - 09/28/11   LLQ - 50 Gy/25 Fractions with Boost of 10 Gy / 5 fractions   Sialoadenitis of submandibular gland 2016   Vitamin D deficiency     Patient Active Problem List   Diagnosis Date Noted   Hypercholesterolemia 05/10/2022   Trochanteric bursitis of right hip 05/10/2022   Preop exam for internal medicine 02/09/2022   Plantar flexed metatarsal bone of right foot 01/11/2022   Grief 09/28/2021   Leg swelling 04/18/2021   Incisional breast wound 04/18/2021   History of sarcoidosis 04/18/2021   History of bronchitis 04/18/2021   GERD (gastroesophageal reflux disease) 04/18/2021   Family history of prostate cancer 04/18/2021   Depression 04/18/2021   Complication of anesthesia 04/18/2021   Colon polyp 04/18/2021   Cigarette smoker 04/18/2021   Cancer (HCC) 04/18/2021   Coronary atherosclerosis 01/30/2021   Atherosclerosis of aorta (HCC) 01/30/2021   Primary hypertension 01/14/2021   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   Knee pain, left 08/28/2019   Elevated BP without diagnosis of hypertension 08/28/2019  Callus of foot 08/28/2019   Cholelithiasis 12/26/2018   Genetic testing 12/26/2017   Family history of breast cancer    Family history of pancreatic cancer    Family history of colon cancer    Abdominal pain 07/28/2016   Morbid obesity (HCC) 03/13/2016   Dyspnea 11/09/2015   Rash and nonspecific skin eruption 07/27/2015   Well adult exam 07/23/2014   Sialoadenitis 06/02/2014   Edema 07/07/2013   AB (asthmatic bronchitis) 06/23/2013   Anxiety    Malignant neoplasm of upper-outer quadrant of left breast in female, 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   Allergy 06/06/2011   COLONIC POLYPS 01/25/2010   DIVERTICULOSIS OF COLON 01/25/2010   DEGENERATIVE JOINT DISEASE 11/06/2008   PANIC DISORDER 07/23/2008   Sarcoidosis 10/13/2007   Vitamin D deficiency  10/13/2007   ANEMIA 10/13/2007   BRONCHITIS 10/13/2007   Dyslipidemia 04/23/2007   Adjustment disorder with mixed anxiety and depressed mood 04/23/2007   Venous (peripheral) insufficiency 04/23/2007   HEADACHE 04/23/2007    Past Surgical History:  Procedure Laterality Date   BIOPSY BREAST     left breast  as a teenager  benign   BREAST BIOPSY Left 06/06/2011   BREAST EXCISIONAL BIOPSY Bilateral    BREAST LUMPECTOMY Left 2013   BREAST SURGERY  07/05/11   left breast lumpectomy with needle loc & axillary sln bx   BUNIONECTOMY  2011   bilateral by Dr Celene Skeen   CHOLECYSTECTOMY  02/2019   COLONOSCOPY     polyp   cyst removed from right hand  1995   POLYPECTOMY     TONSILLECTOMY  1972   TOTAL ABDOMINAL HYSTERECTOMY  2000   Dr Sharalyn Ink FINGER RELEASE  2008   Dr Mina Marble    OB History   No obstetric history on file.    Obstetric Comments  Menarche age 64, nulliparity, HRT x 1 year, Hysterectomy          Home Medications    Prior to Admission medications   Medication Sig Start Date End Date Taking? Authorizing Provider  benzonatate (TESSALON) 100 MG capsule Take 1 capsule (100 mg total) by mouth 3 (three) times daily as needed for cough. 01/30/23  Yes Zenia Resides, MD  doxycycline (VIBRAMYCIN) 100 MG capsule Take 1 capsule (100 mg total) by mouth 2 (two) times daily for 7 days. 01/30/23 02/06/23 Yes Macklyn Glandon, Janace Aris, MD  albuterol (VENTOLIN HFA) 108 (90 Base) MCG/ACT inhaler Inhale 2 puffs into the lungs every 6 (six) hours as needed for wheezing or shortness of breath. 01/04/23   Tomma Lightning, MD  ALPRAZolam Prudy Feeler) 1 MG tablet Take 0.5-1 tablets (0.5-1 mg total) by mouth 3 (three) times daily as needed for anxiety. 08/09/22   Plotnikov, Georgina Quint, MD  amLODipine (NORVASC) 10 MG tablet Take 0.5 tablets (5 mg total) by mouth daily. 08/09/22   Plotnikov, Georgina Quint, MD  budesonide-formoterol (SYMBICORT) 80-4.5 MCG/ACT inhaler Inhale 2 puffs into the lungs 2 (two)  times daily. 01/24/23   Plotnikov, Georgina Quint, MD  busPIRone (BUSPAR) 30 MG tablet TAKE 1/2 TO 1 (ONE-HALF TO ONE) TABLET BY MOUTH TWICE DAILY 12/22/22   Plotnikov, Georgina Quint, MD  celecoxib (CELEBREX) 200 MG capsule TAKE 1 CAPSULE (200 MG TOTAL) BY MOUTH DAILY AS NEEDED FOR MODERATE PAIN. 11/13/22   Plotnikov, Georgina Quint, MD  cetirizine (ZYRTEC ALLERGY) 10 MG tablet Take 1 tablet (10 mg total) by mouth daily. 05/10/22 05/10/23  Plotnikov, Georgina Quint, MD  Cholecalciferol 5000 UNITS  TABS Take 1 tablet by mouth 3 (three) times a week.    [provider]  clobetasol cream (TEMOVATE) 0.05 % Apply 1 application. topically 2 (two) times daily. 06/28/21   Plotnikov, Georgina Quint, MD  diclofenac Sodium (VOLTAREN) 1 % GEL Apply 4 g topically 4 (four) times daily. Patient taking differently: Apply 4 g topically as needed (Joint pain). 08/11/19   Lamptey, Britta Mccreedy, MD  ipratropium (ATROVENT) 0.03 % nasal spray Place 2 sprays into both nostrils 2 (two) times daily as needed for rhinitis. 07/27/20   Bing Neighbors, NP  Multiple Vitamin (MULTIVITAMIN WITH MINERALS) TABS tablet Take 1 tablet by mouth daily. Unknown strength    [provider]  triamcinolone ointment (KENALOG) 0.1 % Apply 1 Application topically 3 (three) times daily. 12/29/21   Plotnikov, Georgina Quint, MD  pantoprazole (PROTONIX) 40 MG tablet Take 1 tablet (40 mg total) by mouth daily. 04/28/19 08/11/19  Plotnikov, Georgina Quint, MD    Family History Family History  Problem Relation Age of Onset   Dementia Mother    Hypertension Mother    Anemia Mother    Other Mother        + H Pylori   Seizures Mother    Hypertension Father    Prostate cancer Father        dx over 4   Colon cancer Father 93       diagnosed 2009   Bladder Cancer Father        dx over 5   Colon polyps Father    Hypertension Sister    Breast cancer Sister        dx in her 30s; mat half sister   Pancreatic cancer Sister 68       d. 24   Lung cancer Sister     Hypertension Sister    Sarcoidosis Sister        mat 1/2 sister   Multiple sclerosis Brother        #2   Hypertension Brother        #1   Hyperlipidemia Brother    Prostate cancer Brother        dx under 50   Dementia Maternal Aunt    Dementia Maternal Aunt    Seizures Maternal Uncle    Lung cancer Maternal Uncle    Lung cancer Paternal Aunt    Breast cancer Cousin        pat first cousin d. 65   Sarcoidosis Cousin        mat first cousin   Breast cancer Cousin        maternal 2nd cousins - distant   Brain cancer Other        benign   Esophageal cancer Neg Hx    Stomach cancer Neg Hx    Rectal cancer Neg Hx     Social History Social History   Tobacco Use   Smoking status: Former    Current packs/day: 0.00    Types: Cigarettes    Quit date: 12/28/2020    Years since quitting: 2.0   Smokeless tobacco: Never   Tobacco comments:    STOPPED AND RESTARTED  Vaping Use   Vaping status: Never Used  Substance Use Topics   Alcohol use: Yes    Alcohol/week: 0.0 - 1.0 standard drinks of alcohol    Comment: social   Drug use: No     Allergies   Lexapro [escitalopram oxalate], Microzide [hydrochlorothiazide], and Penicillins   Review  of Systems Review of Systems  Respiratory:  Positive for cough.      Physical Exam Triage Vital Signs ED Triage Vitals [01/30/23 1719]  Encounter Vitals Group     BP (!) 187/109     Systolic BP Percentile      Diastolic BP Percentile      Pulse Rate 91     Resp 18     Temp 98.5 F (36.9 C)     Temp Source Oral     SpO2 96 %     Weight      Height      Head Circumference      Peak Flow      Pain Score 0     Pain Loc      Pain Education      Exclude from Growth Chart    No data found.  Updated Vital Signs BP (!) 187/109 (BP Location: Right Arm)   Pulse 91   Temp 98.5 F (36.9 C) (Oral)   Resp 18   SpO2 96%   Visual Acuity Right Eye Distance:   Left Eye Distance:   Bilateral Distance:    Right Eye Near:    Left Eye Near:    Bilateral Near:     Physical Exam Vitals reviewed.  Constitutional:      General: She is not in acute distress.    Appearance: She is not ill-appearing, toxic-appearing or diaphoretic.  HENT:     Nose: Congestion and rhinorrhea present.     Mouth/Throat:     Mouth: Mucous membranes are moist.     Comments: White mucus is draining Eyes:     Extraocular Movements: Extraocular movements intact.     Conjunctiva/sclera: Conjunctivae normal.     Pupils: Pupils are equal, round, and reactive to light.  Cardiovascular:     Rate and Rhythm: Normal rate and regular rhythm.     Heart sounds: No murmur heard. Pulmonary:     Effort: Pulmonary effort is normal. No respiratory distress.     Breath sounds: No stridor. No wheezing, rhonchi or rales.  Musculoskeletal:     Cervical back: Neck supple.  Lymphadenopathy:     Cervical: No cervical adenopathy.  Skin:    Capillary Refill: Capillary refill takes less than 2 seconds.     Coloration: Skin is not jaundiced or pale.  Neurological:     General: No focal deficit present.     Mental Status: She is alert and oriented to person, place, and time.  Psychiatric:        Behavior: Behavior normal.      UC Treatments / Results  Labs (all labs ordered are listed, but only abnormal results are displayed) Labs Reviewed - No data to display  EKG   Radiology No results found.  Procedures Procedures (including critical care time)  Medications Ordered in UC Medications - No data to display  Initial Impression / Assessment and Plan / UC Course  I have reviewed the triage vital signs and the nursing notes.  Pertinent labs & imaging results that were available during my care of the patient were reviewed by me and considered in my medical decision making (see chart for details).     Her lung exam is reassuring and I think she has developed an acute sinusitis.  Doxycycline is sent in to treat that and Tessalon Perles  are sent in for the cough.  She is going to have a follow-up appointment that is already scheduled  with her primary care next week and she can follow-up on her elevated blood pressure with them.  She will check her blood pressure some at home  Final Clinical Impressions(s) / UC Diagnoses   Final diagnoses:  Acute sinusitis, recurrence not specified, unspecified location     Discharge Instructions      Take doxycycline 100 mg --1 capsule 2 times daily for 7 days  Take benzonatate 100 mg, 1 tab every 8 hours as needed for cough.       ED Prescriptions     Medication Sig Dispense Auth. Provider   doxycycline (VIBRAMYCIN) 100 MG capsule Take 1 capsule (100 mg total) by mouth 2 (two) times daily for 7 days. 14 capsule Teriah Muela, Janace Aris, MD   benzonatate (TESSALON) 100 MG capsule Take 1 capsule (100 mg total) by mouth 3 (three) times daily as needed for cough. 21 capsule Zenia Resides, MD      PDMP not reviewed this encounter.   Zenia Resides, MD 01/30/23 [id number]

## 2023-02-06 ENCOUNTER — Ambulatory Visit (HOSPITAL_BASED_OUTPATIENT_CLINIC_OR_DEPARTMENT_OTHER): Payer: No Typology Code available for payment source

## 2023-02-06 ENCOUNTER — Encounter (HOSPITAL_BASED_OUTPATIENT_CLINIC_OR_DEPARTMENT_OTHER): Payer: Self-pay | Admitting: Student

## 2023-02-06 ENCOUNTER — Ambulatory Visit (HOSPITAL_BASED_OUTPATIENT_CLINIC_OR_DEPARTMENT_OTHER): Payer: No Typology Code available for payment source | Admitting: Student

## 2023-02-06 DIAGNOSIS — M25561 Pain in right knee: Secondary | ICD-10-CM

## 2023-02-06 DIAGNOSIS — M1711 Unilateral primary osteoarthritis, right knee: Secondary | ICD-10-CM | POA: Diagnosis not present

## 2023-02-06 MED ORDER — TRIAMCINOLONE ACETONIDE 40 MG/ML IJ SUSP
2.0000 mL | INTRAMUSCULAR | Status: AC | PRN
  Administered 2023-02-06: 2 mL via INTRA_ARTICULAR

## 2023-02-06 MED ORDER — LIDOCAINE HCL 1 % IJ SOLN
4.0000 mL | INTRAMUSCULAR | Status: AC | PRN
  Administered 2023-02-06: 4 mL

## 2023-02-06 NOTE — Progress Notes (Signed)
Chief Complaint: Right knee pain     History of Present Illness:    Marilyn [name] is a 67 y.o. female here today for evaluation of right knee pain.  She does have a known history of bilateral knee osteoarthritis and has been seen by Dr. Roda Shutters for this in the past.  She reports that current pain in the right knee began about 2 weeks ago and has been worsening over the last couple days.  Pain is mainly in the medial and posterior knee.  States that her knee gets stiff after long periods in a seated position.  Occasionally feels like her knee wants to give out.  She has been wearing a web reaction brace as well as taking Tylenol arthritis.   Surgical History:   None  PMH/PSH/Family History/Social History/Meds/Allergies:    Past Medical History:  Diagnosis Date   Allergy    Anemia    hx   Anxiety    panic disorder   Breast cancer (HCC) 06/06/2011   bc  left breast 3 o'clock dx=invasive ductal ca uoqER/PR=positive   Cancer (HCC)    BREAST - left   Cigarette smoker    Colon polyp    Complication of anesthesia    DIFFICULTY AWAKENING, BP INCREASE AFTER CHOLECYSTECTEMY    COPD (chronic obstructive pulmonary disease) (HCC)    Depression    Diverticulosis of colon    DJD (degenerative joint disease)    DJD (degenerative joint disease)    Family history of breast cancer    Family history of colon cancer    Family history of pancreatic cancer    Family history of prostate cancer    Gallstones    GERD (gastroesophageal reflux disease)    Pt. denies having GERD. Unable to remove it.   Headache(784.0)    History of anemia    History of bronchitis    History of sarcoidosis    Hyperlipidemia    no meds needed   Incisional breast wound    AT AGE 5 BILATERAL INCISION TO BREAST MADE   Leg swelling    Panic disorder    Personal history of radiation therapy    S/P radiation therapy 08/17/11 - 09/28/11   LLQ - 50 Gy/25 Fractions with Boost of 10  Gy / 5 fractions   Sialoadenitis of submandibular gland 2016   Vitamin D deficiency    Past Surgical History:  Procedure Laterality Date   BIOPSY BREAST     left breast  as a teenager  benign   BREAST BIOPSY Left 06/06/2011   BREAST EXCISIONAL BIOPSY Bilateral    BREAST LUMPECTOMY Left 2013   BREAST SURGERY  07/05/11   left breast lumpectomy with needle loc & axillary sln bx   BUNIONECTOMY  2011   bilateral by Dr Celene Skeen   CHOLECYSTECTOMY  02/2019   COLONOSCOPY     polyp   cyst removed from right hand  1995   POLYPECTOMY     TONSILLECTOMY  1972   TOTAL ABDOMINAL HYSTERECTOMY  2000   Dr Sharalyn Ink FINGER RELEASE  2008   Dr Mina Marble   Social History   Socioeconomic History   Marital status: Married    Spouse name: Not on file   Number of children: 2   Years of education: Not on  file   Highest education level: Bachelor's degree (e.g., BA, AB, BS)  Occupational History   Occupation: retired  Tobacco Use   Smoking status: Former    Current packs/day: 0.00    Types: Cigarettes    Quit date: 12/28/2020    Years since quitting: 2.1   Smokeless tobacco: Never   Tobacco comments:    STOPPED AND RESTARTED  Vaping Use   Vaping status: Never Used  Substance and Sexual Activity   Alcohol use: Yes    Alcohol/week: 0.0 - 1.0 standard drinks of alcohol    Comment: social   Drug use: No   Sexual activity: Not Currently    Birth control/protection: Surgical    Comment: menses age 78,hrt started  March 05, 1999  Other Topics Concern   Not on file  Social History Narrative   Widowed - husband died 03-04-04   2 step-children   Exercises 3x per week   2 cups of caffeine daily         Social Determinants of Health   Financial Resource Strain: Low Risk  (08/07/2022)   Overall Financial Resource Strain (CARDIA)    Difficulty of Paying Living Expenses: Not very hard  Food Insecurity: No Food Insecurity (08/07/2022)   Hunger Vital Sign    Worried About Running Out of Food in the Last  Year: Never true    Ran Out of Food in the Last Year: Never true  Transportation Needs: No Transportation Needs (08/07/2022)   PRAPARE - Administrator, Civil Service (Medical): No    Lack of Transportation (Non-Medical): No  Physical Activity: Unknown (08/07/2022)   Exercise Vital Sign    Days of Exercise per Week: 0 days    Minutes of Exercise per Session: Not on file  Stress: No Stress Concern Present (08/07/2022)   [employer] of Occupational Health - Occupational Stress Questionnaire    Feeling of Stress : Not at all  Social Connections: Socially Integrated (08/07/2022)   Social Connection and Isolation Panel [NHANES]    Frequency of Communication with Friends and Family: Three times a week    Frequency of Social Gatherings with Friends and Family: Once a week    Attends Religious Services: More than 4 times per year    Active Member of [employer] or Organizations: Yes    Attends Banker Meetings: 1 to 4 times per year    Marital Status: Married   Family History  Problem Relation Age of Onset   Dementia Mother    Hypertension Mother    Anemia Mother    Other Mother        + H Pylori   Seizures Mother    Hypertension Father    Prostate cancer Father        dx over 36   Colon cancer Father 31       diagnosed Mar 04, 2008   Bladder Cancer Father        dx over 1   Colon polyps Father    Hypertension Sister    Breast cancer Sister        dx in her 30s; mat half sister   Pancreatic cancer Sister 45       d. 25   Lung cancer Sister    Hypertension Sister    Sarcoidosis Sister        mat 1/2 sister   Multiple sclerosis Brother        #2   Hypertension Brother        #  1   Hyperlipidemia Brother    Prostate cancer Brother        dx under 50   Dementia Maternal Aunt    Dementia Maternal Aunt    Seizures Maternal Uncle    Lung cancer Maternal Uncle    Lung cancer Paternal Aunt    Breast cancer Cousin        pat first cousin d. 82   Sarcoidosis  Cousin        mat first cousin   Breast cancer Cousin        maternal 2nd cousins - distant   Brain cancer Other        benign   Esophageal cancer Neg Hx    Stomach cancer Neg Hx    Rectal cancer Neg Hx    Allergies  Allergen Reactions   Lexapro [Escitalopram Oxalate]     Felt like zombie   Microzide [Hydrochlorothiazide]     Palpitations   Penicillins Rash and Other (See Comments)    At injection site.   Current Outpatient Medications  Medication Sig Dispense Refill   albuterol (VENTOLIN HFA) 108 (90 Base) MCG/ACT inhaler Inhale 2 puffs into the lungs every 6 (six) hours as needed for wheezing or shortness of breath. 8 g 6   ALPRAZolam (XANAX) 1 MG tablet Take 0.5-1 tablets (0.5-1 mg total) by mouth 3 (three) times daily as needed for anxiety. 90 tablet 3   amLODipine (NORVASC) 10 MG tablet Take 0.5 tablets (5 mg total) by mouth daily. 90 tablet 1   benzonatate (TESSALON) 100 MG capsule Take 1 capsule (100 mg total) by mouth 3 (three) times daily as needed for cough. 21 capsule 0   budesonide-formoterol (SYMBICORT) 80-4.5 MCG/ACT inhaler Inhale 2 puffs into the lungs 2 (two) times daily. 1 each 11   busPIRone (BUSPAR) 30 MG tablet TAKE 1/2 TO 1 (ONE-HALF TO ONE) TABLET BY MOUTH TWICE DAILY 60 tablet 2   celecoxib (CELEBREX) 200 MG capsule TAKE 1 CAPSULE (200 MG TOTAL) BY MOUTH DAILY AS NEEDED FOR MODERATE PAIN. 30 capsule 3   cetirizine (ZYRTEC ALLERGY) 10 MG tablet Take 1 tablet (10 mg total) by mouth daily. 90 tablet 3   Cholecalciferol 5000 UNITS TABS Take 1 tablet by mouth 3 (three) times a week.     clobetasol cream (TEMOVATE) 0.05 % Apply 1 application. topically 2 (two) times daily. 60 g 3   diclofenac Sodium (VOLTAREN) 1 % GEL Apply 4 g topically 4 (four) times daily. (Patient taking differently: Apply 4 g topically as needed (Joint pain).)     doxycycline (VIBRAMYCIN) 100 MG capsule Take 1 capsule (100 mg total) by mouth 2 (two) times daily for 7 days. 14 capsule 0    ipratropium (ATROVENT) 0.03 % nasal spray Place 2 sprays into both nostrils 2 (two) times daily as needed for rhinitis. 30 mL 0   Multiple Vitamin (MULTIVITAMIN WITH MINERALS) TABS tablet Take 1 tablet by mouth daily. Unknown strength     triamcinolone ointment (KENALOG) 0.1 % Apply 1 Application topically 3 (three) times daily. 80 g 2   No current facility-administered medications for this visit.   No results found.  Review of Systems:   A ROS was performed including pertinent positives and negatives as documented in the HPI.  Physical Exam :   Constitutional: NAD and appears stated age Neurological: Alert and oriented Psych: Appropriate affect and cooperative There were no vitals taken for this visit.   Comprehensive Musculoskeletal Exam:    Right  knee appears mildly swollen with no overlying erythema or warmth.  Active range of motion from 0 to 100 degrees with palpable crepitus.  Positive medial joint line tenderness.  No significant effusion palpable.  Knee flexion and extension strength is 5/5.  No laxity with varus or valgus stress.  Imaging:   Xray (right knee pain 4 views): Moderate tricompartmental osteoarthritis most notable in the medial and patellofemoral compartments.   I personally reviewed and interpreted the radiographs.   Assessment:   67 y.o. female with moderate to advanced bilateral knee osteoarthritis.  Today she is experiencing pain and stiffness in the right knee, but left knee is currently asymptomatic.  She has had cortisone injections many years in the past and did recently discussed viscosupplementation at visit with Dr. Roda Shutters last year.  Current pain is preventing her from normal activities, so I have recommended trial of a cortisone injection today with aims to alleviate her symptoms.  Patient is agreeable to plan and injection was performed today without any complication.  Recommend follow-up within the next few months with Dr. Roda Shutters for further evaluation and  discussion, possibly considering gel injections.  Plan :    -Right knee cortisone injection performed today  -Follow-up in clinic as needed      Procedure Note  Patient: Shanikwa Canizalez             Date of Birth: [birth year]           MRN: [id number]             Visit Date: 02/06/2023  Procedures: Visit Diagnoses:  1. Acute pain of right knee     Large Joint Inj: R knee on 02/06/2023 5:10 PM Indications: pain Details: 22 G 1.5 in needle, anterolateral approach Medications: 4 mL lidocaine 1 %; 2 mL triamcinolone acetonide 40 MG/ML Outcome: tolerated well, no immediate complications Procedure, treatment alternatives, risks and benefits explained, specific risks discussed. Consent was given by the patient. Immediately prior to procedure a time out was called to verify the correct patient, procedure, equipment, support staff and site/side marked as required. Patient was prepped and draped in the usual sterile fashion.      I personally saw and evaluated the patient, and participated in the management and treatment plan.  Hazle Nordmann, PA-C Orthopedics

## 2023-02-08 ENCOUNTER — Encounter: Payer: Self-pay | Admitting: Internal Medicine

## 2023-02-08 ENCOUNTER — Telehealth: Payer: Self-pay | Admitting: Pulmonary Disease

## 2023-02-08 ENCOUNTER — Ambulatory Visit: Payer: No Typology Code available for payment source | Admitting: Internal Medicine

## 2023-02-08 VITALS — BP 122/70 | HR 84 | Temp 98.6°F | Ht 65.0 in | Wt 224.0 lb

## 2023-02-08 DIAGNOSIS — R739 Hyperglycemia, unspecified: Secondary | ICD-10-CM

## 2023-02-08 DIAGNOSIS — E785 Hyperlipidemia, unspecified: Secondary | ICD-10-CM

## 2023-02-08 DIAGNOSIS — D869 Sarcoidosis, unspecified: Secondary | ICD-10-CM

## 2023-02-08 DIAGNOSIS — F17210 Nicotine dependence, cigarettes, uncomplicated: Secondary | ICD-10-CM

## 2023-02-08 DIAGNOSIS — F419 Anxiety disorder, unspecified: Secondary | ICD-10-CM

## 2023-02-08 DIAGNOSIS — I2583 Coronary atherosclerosis due to lipid rich plaque: Secondary | ICD-10-CM

## 2023-02-08 DIAGNOSIS — I700 Atherosclerosis of aorta: Secondary | ICD-10-CM

## 2023-02-08 DIAGNOSIS — M25569 Pain in unspecified knee: Secondary | ICD-10-CM

## 2023-02-08 DIAGNOSIS — G8929 Other chronic pain: Secondary | ICD-10-CM

## 2023-02-08 LAB — URINALYSIS
Bilirubin Urine: NEGATIVE
Hgb urine dipstick: NEGATIVE
Ketones, ur: NEGATIVE
Leukocytes,Ua: NEGATIVE
Nitrite: NEGATIVE
Specific Gravity, Urine: >=1.03 — AB (ref 1.000–1.030)
Total Protein, Urine: NEGATIVE
Urine Glucose: NEGATIVE
Urobilinogen, UA: 0.2 (ref 0.0–1.0)
pH: 5.5 (ref 5.0–8.0)

## 2023-02-08 LAB — COMPREHENSIVE METABOLIC PANEL
ALT: 12 U/L (ref 0–35)
AST: 14 U/L (ref 0–37)
Albumin: 4.2 g/dL (ref 3.5–5.2)
Alkaline Phosphatase: 83 U/L (ref 39–117)
BUN: 15 mg/dL (ref 6–23)
CO2: 24 meq/L (ref 19–32)
Calcium: 10.1 mg/dL (ref 8.4–10.5)
Chloride: 104 meq/L (ref 96–112)
Creatinine, Ser: 0.69 mg/dL (ref 0.40–1.20)
GFR: 88.85 mL/min (ref >60.00)
Glucose, Bld: 94 mg/dL (ref 70–99)
Potassium: 3.8 meq/L (ref 3.5–5.1)
Sodium: 139 meq/L (ref 135–145)
Total Bilirubin: 0.4 mg/dL (ref 0.2–1.2)
Total Protein: 7.5 g/dL (ref 6.0–8.3)

## 2023-02-08 LAB — CBC WITH DIFFERENTIAL/PLATELET
Basophils Absolute: 0 10*3/uL (ref 0.0–0.1)
Basophils Relative: 0.3 % (ref 0.0–3.0)
Eosinophils Absolute: 0 10*3/uL (ref 0.0–0.7)
Eosinophils Relative: 0.1 % (ref 0.0–5.0)
HCT: 38.1 % (ref 36.0–46.0)
Hemoglobin: 12 g/dL (ref 12.0–15.0)
Lymphocytes Relative: 12 % (ref 12.0–46.0)
Lymphs Abs: 1.5 10*3/uL (ref 0.7–4.0)
MCHC: 31.5 g/dL (ref 30.0–36.0)
MCV: 74.8 fL — ABNORMAL LOW (ref 78.0–100.0)
Monocytes Absolute: 0.9 10*3/uL (ref 0.1–1.0)
Monocytes Relative: 7.5 % (ref 3.0–12.0)
Neutro Abs: 9.8 10*3/uL — ABNORMAL HIGH (ref 1.4–7.7)
Neutrophils Relative %: 80.1 % — ABNORMAL HIGH (ref 43.0–77.0)
Platelets: 292 10*3/uL (ref 150.0–400.0)
RBC: 5.09 Mil/uL (ref 3.87–5.11)
RDW: 14.8 % (ref 11.5–15.5)
WBC: 12.2 10*3/uL — ABNORMAL HIGH (ref 4.0–10.5)

## 2023-02-08 LAB — LIPID PANEL
Cholesterol: 223 mg/dL — ABNORMAL HIGH (ref 0–200)
HDL: 83.6 mg/dL (ref >39.00)
LDL Cholesterol: 125 mg/dL — ABNORMAL HIGH (ref 0–99)
NonHDL: 139.21
Total CHOL/HDL Ratio: 3
Triglycerides: 73 mg/dL (ref 0.0–149.0)
VLDL: 14.6 mg/dL (ref 0.0–40.0)

## 2023-02-08 LAB — TSH: TSH: 1.75 u[IU]/mL (ref 0.35–5.50)

## 2023-02-08 LAB — HEMOGLOBIN A1C: Hgb A1c MFr Bld: 6.4 % (ref 4.6–6.5)

## 2023-02-08 MED ORDER — ALPRAZOLAM 1 MG PO TABS: 0.5000 mg | ORAL_TABLET | Freq: Three times a day (TID) | ORAL | 3 refills | Status: DC | PRN

## 2023-02-08 MED ORDER — AMLODIPINE BESYLATE 5 MG PO TABS: 5.0000 mg | ORAL_TABLET | Freq: Every day | ORAL | 3 refills | Status: DC

## 2023-02-08 NOTE — Telephone Encounter (Signed)
Pls call PT w/results of CT [phone]

## 2023-02-08 NOTE — Progress Notes (Signed)
Subjective:  Patient ID: Marilyn [name], female    DOB: 11-01-1955  Age: 67 y.o. MRN: [id number]  CC: Medical Management of Chronic Issues (6 mnth f/u )   HPI Marilyn [name] presents for R knee pain, anxiety, HTN  Outpatient Medications Prior to Visit  Medication Sig Dispense Refill   albuterol (VENTOLIN HFA) 108 (90 Base) MCG/ACT inhaler Inhale 2 puffs into the lungs every 6 (six) hours as needed for wheezing or shortness of breath. 8 g 6   benzonatate (TESSALON) 100 MG capsule Take 1 capsule (100 mg total) by mouth 3 (three) times daily as needed for cough. 21 capsule 0   budesonide-formoterol (SYMBICORT) 80-4.5 MCG/ACT inhaler Inhale 2 puffs into the lungs 2 (two) times daily. 1 each 11   busPIRone (BUSPAR) 30 MG tablet TAKE 1/2 TO 1 (ONE-HALF TO ONE) TABLET BY MOUTH TWICE DAILY 60 tablet 2   celecoxib (CELEBREX) 200 MG capsule TAKE 1 CAPSULE (200 MG TOTAL) BY MOUTH DAILY AS NEEDED FOR MODERATE PAIN. 30 capsule 3   cetirizine (ZYRTEC ALLERGY) 10 MG tablet Take 1 tablet (10 mg total) by mouth daily. 90 tablet 3   Cholecalciferol 5000 UNITS TABS Take 1 tablet by mouth 3 (three) times a week.     clobetasol cream (TEMOVATE) 0.05 % Apply 1 application. topically 2 (two) times daily. 60 g 3   diclofenac Sodium (VOLTAREN) 1 % GEL Apply 4 g topically 4 (four) times daily. (Patient taking differently: Apply 4 g topically as needed (Joint pain).)     ipratropium (ATROVENT) 0.03 % nasal spray Place 2 sprays into both nostrils 2 (two) times daily as needed for rhinitis. 30 mL 0   Multiple Vitamin (MULTIVITAMIN WITH MINERALS) TABS tablet Take 1 tablet by mouth daily. Unknown strength     triamcinolone ointment (KENALOG) 0.1 % Apply 1 Application topically 3 (three) times daily. 80 g 2   ALPRAZolam (XANAX) 1 MG tablet Take 0.5-1 tablets (0.5-1 mg total) by mouth 3 (three) times daily as needed for anxiety. 90 tablet 3   amLODipine (NORVASC) 10 MG tablet Take 0.5 tablets (5 mg total)  by mouth daily. 90 tablet 1   No facility-administered medications prior to visit.    ROS: Review of Systems  Constitutional:  Negative for activity change, appetite change, chills, fatigue and unexpected weight change.  HENT:  Negative for congestion, mouth sores and sinus pressure.   Eyes:  Negative for visual disturbance.  Respiratory:  Negative for cough and chest tightness.   Gastrointestinal:  Negative for abdominal pain and nausea.  Genitourinary:  Negative for difficulty urinating, frequency and vaginal pain.  Musculoskeletal:  Positive for arthralgias and gait problem. Negative for back pain.  Skin:  Negative for pallor and rash.  Neurological:  Negative for dizziness, tremors, weakness, numbness and headaches.  Psychiatric/Behavioral:  Negative for confusion, sleep disturbance and suicidal ideas. The patient is nervous/anxious.     Objective:  BP 122/70 (BP Location: Right Arm, Patient Position: Sitting, Cuff Size: Normal)   Pulse 84   Temp 98.6 F (37 C) (Oral)   Ht 5\' 5"  (1.651 m)   Wt 224 lb (101.6 kg)   SpO2 97%   BMI 37.28 kg/m   BP Readings from Last 3 Encounters:  02/08/23 122/70  01/30/23 (!) 187/109  01/22/23 (!) 149/84    Wt Readings from Last 3 Encounters:  02/08/23 224 lb (101.6 kg)  01/15/23 229 lb 3.2 oz (104 kg)  01/04/23 226 lb (102.5 kg)  Physical Exam Constitutional:      General: She is not in acute distress.    Appearance: She is well-developed. She is obese.  HENT:     Head: Normocephalic.     Right Ear: External ear normal.     Left Ear: External ear normal.     Nose: Nose normal.  Eyes:     General:        Right eye: No discharge.        Left eye: No discharge.     Conjunctiva/sclera: Conjunctivae normal.     Pupils: Pupils are equal, round, and reactive to light.  Neck:     Thyroid: No thyromegaly.     Vascular: No JVD.     Trachea: No tracheal deviation.  Cardiovascular:     Rate and Rhythm: Normal rate and regular  rhythm.     Heart sounds: Normal heart sounds.  Pulmonary:     Effort: No respiratory distress.     Breath sounds: No stridor. No wheezing.  Abdominal:     General: Bowel sounds are normal. There is no distension.     Palpations: Abdomen is soft. There is no mass.     Tenderness: There is no abdominal tenderness. There is no guarding or rebound.  Musculoskeletal:        General: No tenderness.     Cervical back: Normal range of motion and neck supple. No rigidity.  Lymphadenopathy:     Cervical: No cervical adenopathy.  Skin:    Findings: No erythema or rash.  Neurological:     Mental Status: She is oriented to person, place, and time.     Cranial Nerves: No cranial nerve deficit.     Motor: No abnormal muscle tone.     Coordination: Coordination normal.     Gait: Gait abnormal.     Deep Tendon Reflexes: Reflexes normal.  Psychiatric:        Behavior: Behavior normal.        Thought Content: Thought content normal.        Judgment: Judgment normal.   R knee w/pain - in a brace; using a cane  Lab Results  Component Value Date   WBC 8.5 02/09/2022   HGB 12.0 02/09/2022   HCT 37.0 02/09/2022   PLT 267.0 02/09/2022   GLUCOSE 92 02/09/2022   CHOL 209 (H) 12/28/2020   TRIG 70.0 12/28/2020   HDL 69.20 12/28/2020   LDLDIRECT 102 (H) 10/14/2021   LDLCALC 126 (H) 12/28/2020   ALT 11 02/09/2022   AST 14 02/09/2022   NA 139 02/09/2022   K 4.0 02/09/2022   CL 106 02/09/2022   CREATININE 0.69 02/09/2022   BUN 9 02/09/2022   CO2 28 02/09/2022   TSH 1.25 12/28/2020   HGBA1C 6.2 02/09/2022    No results found.  Assessment & Plan:   Problem List Items Addressed This Visit     Sarcoidosis   F/u w/Dr Wynona Neat Pt had a chest CT      Dyslipidemia   Relevant Orders   TSH   Urinalysis   CBC with Differential/Platelet   Lipid panel   Comprehensive metabolic panel   Hemoglobin A1c   Anxiety - Primary   On Buspar po On Xanax prn  Potential benefits of a long term  benzodiazepines  use as well as potential risks  and complications were explained to the patient and were aknowledged.      Relevant Medications   ALPRAZolam (XANAX) 1 MG tablet  Other Relevant Orders   TSH   Urinalysis   CBC with Differential/Platelet   Lipid panel   Comprehensive metabolic panel   Hemoglobin A1c   Knee pain, chronic   S/p steroid injection Celebrex, Norco prn Brace Ortho f/u w/Dr Roda Shutters Blue-Emu cream was recommended to use 2-3 times a day       Coronary atherosclerosis   Declined statins On diet. Fish oil Pt stopped smoking 12/2020      Relevant Medications   amLODipine (NORVASC) 5 MG tablet   Aortic atherosclerosis (HCC)   Declined statins On diet. Fish oil Pt stopped smoking 12/2020      Relevant Medications   amLODipine (NORVASC) 5 MG tablet   Cigarette smoker   Quit smoking in Nov 2022      Other Visit Diagnoses       Hyperglycemia       Relevant Orders   TSH   Urinalysis   CBC with Differential/Platelet   Lipid panel   Comprehensive metabolic panel   Hemoglobin A1c         Meds ordered this encounter  Medications   ALPRAZolam (XANAX) 1 MG tablet    Sig: Take 0.5-1 tablets (0.5-1 mg total) by mouth 3 (three) times daily as needed for anxiety.    Dispense:  90 tablet    Refill:  3   amLODipine (NORVASC) 5 MG tablet    Sig: Take 1 tablet (5 mg total) by mouth daily.    Dispense:  90 tablet    Refill:  3      Follow-up: Return in about 6 months (around 08/09/2023) for Wellness Exam.  Sonda Primes, MD

## 2023-02-08 NOTE — Assessment & Plan Note (Signed)
Declined statins On diet. Fish oil Pt stopped smoking 12/2020

## 2023-02-08 NOTE — Assessment & Plan Note (Signed)
S/p steroid injection Celebrex, Norco prn Brace Ortho f/u w/Dr Roda Shutters Blue-Emu cream was recommended to use 2-3 times a day

## 2023-02-08 NOTE — Assessment & Plan Note (Signed)
Quit smoking in Nov 2022 ?

## 2023-02-08 NOTE — Assessment & Plan Note (Signed)
On Buspar po On Xanax prn  Potential benefits of a long term benzodiazepines  use as well as potential risks  and complications were explained to the patient and were aknowledged.

## 2023-02-08 NOTE — Assessment & Plan Note (Signed)
F/u w/Dr Wynona Neat Pt had a chest CT

## 2023-02-09 NOTE — Telephone Encounter (Signed)
Called and advised pt results has been sent to DR.O and we will give her a call once reviewed. She verbalized understanding nfn.  Dr.O please advise

## 2023-02-13 NOTE — Telephone Encounter (Signed)
NFN 

## 2023-02-26 ENCOUNTER — Encounter: Payer: Self-pay | Admitting: Pulmonary Disease

## 2023-03-14 ENCOUNTER — Institutional Professional Consult (permissible substitution): Payer: No Typology Code available for payment source | Admitting: Pulmonary Disease

## 2023-03-15 ENCOUNTER — Ambulatory Visit (INDEPENDENT_AMBULATORY_CARE_PROVIDER_SITE_OTHER): Payer: Commercial Managed Care - PPO | Admitting: Cardiovascular Disease

## 2023-03-15 ENCOUNTER — Encounter (HOSPITAL_BASED_OUTPATIENT_CLINIC_OR_DEPARTMENT_OTHER): Payer: Self-pay | Admitting: Cardiovascular Disease

## 2023-03-15 VITALS — BP 152/86 | HR 84 | Ht 65.0 in | Wt 227.7 lb

## 2023-03-15 DIAGNOSIS — Z6837 Body mass index (BMI) 37.0-37.9, adult: Secondary | ICD-10-CM | POA: Diagnosis not present

## 2023-03-15 DIAGNOSIS — I2583 Coronary atherosclerosis due to lipid rich plaque: Secondary | ICD-10-CM

## 2023-03-15 DIAGNOSIS — I700 Atherosclerosis of aorta: Secondary | ICD-10-CM | POA: Diagnosis not present

## 2023-03-15 DIAGNOSIS — E785 Hyperlipidemia, unspecified: Secondary | ICD-10-CM

## 2023-03-15 DIAGNOSIS — I10 Essential (primary) hypertension: Secondary | ICD-10-CM

## 2023-03-15 DIAGNOSIS — Z5181 Encounter for therapeutic drug level monitoring: Secondary | ICD-10-CM

## 2023-03-15 MED ORDER — ROSUVASTATIN CALCIUM 10 MG PO TABS: 10.0000 mg | ORAL_TABLET | Freq: Every day | ORAL | 3 refills | Status: DC

## 2023-03-15 MED ORDER — SPIRONOLACTONE 25 MG PO TABS: 25.0000 mg | ORAL_TABLET | Freq: Every day | ORAL | 3 refills | Status: DC

## 2023-03-15 NOTE — Progress Notes (Signed)
[facility] Initial Assessment:    Date:  03/15/2023   ID:  Marilyn [name], DOB 1955/06/21, MRN [id number]  PCP:  Tresa Garter, MD  Cardiologist:  None  Nephrologist:  Referring MD: Tresa Garter, MD   CC: Hypertension  History of Present Illness:    Marilyn [name] is a 68 y.o. female with a hx of coronary calcification, OSA, prior tobacco abuse, pre-diabetes, hypertension, hyperlipidemia, left breast cancer s/p radiation therapy, COPD, sarcoidosis, and anemia, here for follow up.  She was first seen in the [facility] 04/2021. She was seen in the ED 03/10/2021 with concern for hypertension. She had recently been started on a new antihypertensive. Cardiac enzymes were negative. She was referred to the [facility]. She saw Dr. Bing Matter on 03/2021 for coronary calcification. He recommended that she start a statin and amlodipine was increased due to poor blood pressure control. He referred her for a sleep study. She first saw him 02/2021 and her blood pressure was 190/100. She had previously been on a diuretic but developed palpitations and was switched to bisoprolol. She had an echo 03/2021 with LVEF 60-65% and grade 1 diastolic dysfunction. There was mild LVH.   She reported noticing her blood pressure has been "up and down" for a while but only recently formally diagnosed. On 01/14/2021 she was started on 12.5 mg HCTZ, which was the first time she was prescribed an antihypertensive. This was switched to bystolic, which was later switched to amlodipine.  At her initial visit she was on amlodipine only and blood pressure was averaging in the 120s to 130s over 80s.  She was under stress from caring for her mother with dementia in her father who was on hemodialysis.  She noted some exertional dyspnea.  We recommended getting more exercise and working on her stress levels.  Prior CT revealed coronary calcification and a  calcium score that was in the 82nd percentile.  A statin was recommended but she wanted to work on diet and exercise.  She was referred for sleep study and found to have moderate sleep apnea.  Marilyn [name] presents with concerns about weight gain and joint pain. She reports a recent cessation of smoking, which has led to weight gain and increased discomfort in her joints. The patient has been struggling with bursitis in her hip and knee pain, which has hindered her ability to exercise.  She has been dealing with significant familial loss, including the recent deaths of her parents and nephew, which has caused emotional distress. She has not sought out any grief counseling or support groups.  Her blood pressure has been elevated, with readings consistently in the 140s. She has been taking amlodipine 5mg , but due to persistent swelling in her left ankle, she is considering a change in medication.  Marilyn [name] recent lipid panel showed an elevated LDL cholesterol level, which has been persistently high for the past seven years. She has been attempting to manage this through diet and exercise, but progress has been limited.  The patient's recent CT scan revealed emphysema and plaque in her coronary arteries and aorta. She also has a cyst on one of her kidneys, but it is not causing any discomfort or functional issues. The patient's A1C level is 6.4, placing her in the prediabetes range.     Previous antihypertensives: HCTZ Nebivolol   Past Medical History:  Diagnosis Date   Allergy    Anemia    hx   Anxiety  panic disorder   Breast cancer (HCC) 06/06/2011   bc  left breast 3 o'clock dx=invasive ductal ca uoqER/PR=positive   Cancer (HCC)    BREAST - left   Cigarette smoker    Colon polyp    Complication of anesthesia    DIFFICULTY AWAKENING, BP INCREASE AFTER CHOLECYSTECTEMY    COPD (chronic obstructive pulmonary disease) (HCC)    Depression    Diverticulosis of colon    DJD  (degenerative joint disease)    DJD (degenerative joint disease)    Family history of breast cancer    Family history of colon cancer    Family history of pancreatic cancer    Family history of prostate cancer    Gallstones    GERD (gastroesophageal reflux disease)    Pt. denies having GERD. Unable to remove it.   Headache(784.0)    History of anemia    History of bronchitis    History of sarcoidosis    Hyperlipidemia    no meds needed   Incisional breast wound    AT AGE 39 BILATERAL INCISION TO BREAST MADE   Leg swelling    Panic disorder    Personal history of radiation therapy    S/P radiation therapy 08/17/11 - 09/28/11   LLQ - 50 Gy/25 Fractions with Boost of 10 Gy / 5 fractions   Sialoadenitis of submandibular gland 2016   Vitamin D deficiency     Past Surgical History:  Procedure Laterality Date   BIOPSY BREAST     left breast  as a teenager  benign   BREAST BIOPSY Left 06/06/2011   BREAST EXCISIONAL BIOPSY Bilateral    BREAST LUMPECTOMY Left 2013   BREAST SURGERY  07/05/11   left breast lumpectomy with needle loc & axillary sln bx   BUNIONECTOMY  2011   bilateral by Dr Celene Skeen   CHOLECYSTECTOMY  02/2019   COLONOSCOPY     polyp   cyst removed from right hand  1995   POLYPECTOMY     TONSILLECTOMY  1972   TOTAL ABDOMINAL HYSTERECTOMY  2000   Dr Sharalyn Ink FINGER RELEASE  2008   Dr Mina Marble    Current Medications: Current Meds  Medication Sig   albuterol (VENTOLIN HFA) 108 (90 Base) MCG/ACT inhaler Inhale 2 puffs into the lungs every 6 (six) hours as needed for wheezing or shortness of breath.   ALPRAZolam (XANAX) 1 MG tablet Take 0.5-1 tablets (0.5-1 mg total) by mouth 3 (three) times daily as needed for anxiety.   budesonide-formoterol (SYMBICORT) 80-4.5 MCG/ACT inhaler Inhale 2 puffs into the lungs 2 (two) times daily.   busPIRone (BUSPAR) 30 MG tablet TAKE 1/2 TO 1 (ONE-HALF TO ONE) TABLET BY MOUTH TWICE DAILY   celecoxib (CELEBREX) 200 MG capsule TAKE 1  CAPSULE (200 MG TOTAL) BY MOUTH DAILY AS NEEDED FOR MODERATE PAIN.   Cholecalciferol 5000 UNITS TABS Take 1 tablet by mouth 3 (three) times a week.   clobetasol cream (TEMOVATE) 0.05 % Apply 1 application. topically 2 (two) times daily.   diclofenac Sodium (VOLTAREN) 1 % GEL Apply 4 g topically 4 (four) times daily. (Patient taking differently: Apply 4 g topically as needed (Joint pain).)   ipratropium (ATROVENT) 0.03 % nasal spray Place 2 sprays into both nostrils 2 (two) times daily as needed for rhinitis.   Multiple Vitamin (MULTIVITAMIN WITH MINERALS) TABS tablet Take 1 tablet by mouth daily. Unknown strength   rosuvastatin (CRESTOR) 10 MG tablet Take 1 tablet (10 mg total)  by mouth daily.   spironolactone (ALDACTONE) 25 MG tablet Take 1 tablet (25 mg total) by mouth daily.   triamcinolone ointment (KENALOG) 0.1 % Apply 1 Application topically 3 (three) times daily.   [DISCONTINUED] amLODipine (NORVASC) 5 MG tablet Take 1 tablet (5 mg total) by mouth daily.     Allergies:   Lexapro [escitalopram oxalate], Microzide [hydrochlorothiazide], and Penicillins   Social History   Socioeconomic History   Marital status: Married    Spouse name: Not on file   Number of children: 2   Years of education: Not on file   Highest education level: Bachelor's degree (e.g., BA, AB, BS)  Occupational History   Occupation: retired  Tobacco Use   Smoking status: Former    Current packs/day: 0.00    Types: Cigarettes    Quit date: 12/28/2020    Years since quitting: 2.2   Smokeless tobacco: Never   Tobacco comments:    STOPPED AND RESTARTED  Vaping Use   Vaping status: Never Used  Substance and Sexual Activity   Alcohol use: Yes    Alcohol/week: 0.0 - 1.0 standard drinks of alcohol    Comment: social   Drug use: No   Sexual activity: Not Currently    Birth control/protection: Surgical    Comment: menses age 86,hrt started  1998-04-12  Other Topics Concern   Not on file  Social History Narrative    Widowed - husband died Apr 13, 2003   2 step-children   Exercises 3x per week   2 cups of caffeine daily         Social Drivers of Corporate investment banker Strain: Low Risk  (08/07/2022)   Overall Financial Resource Strain (CARDIA)    Difficulty of Paying Living Expenses: Not very hard  Food Insecurity: No Food Insecurity (08/07/2022)   Hunger Vital Sign    Worried About Running Out of Food in the Last Year: Never true    Ran Out of Food in the Last Year: Never true  Transportation Needs: No Transportation Needs (08/07/2022)   PRAPARE - Administrator, Civil Service (Medical): No    Lack of Transportation (Non-Medical): No  Physical Activity: Unknown (08/07/2022)   Exercise Vital Sign    Days of Exercise per Week: 0 days    Minutes of Exercise per Session: Not on file  Stress: No Stress Concern Present (08/07/2022)   [employer] of Occupational Health - Occupational Stress Questionnaire    Feeling of Stress : Not at all  Social Connections: Socially Integrated (08/07/2022)   Social Connection and Isolation Panel [NHANES]    Frequency of Communication with Friends and Family: Three times a week    Frequency of Social Gatherings with Friends and Family: Once a week    Attends Religious Services: More than 4 times per year    Active Member of [employer] or Organizations: Yes    Attends Engineer, structural: 1 to 4 times per year    Marital Status: Married     Family History: The patient's family history includes Anemia in her mother; Bladder Cancer in her father; Brain cancer in an other family member; Breast cancer in her cousin, cousin, and sister; Colon cancer (age of onset: 40) in her father; Colon polyps in her father; Dementia in her maternal aunt, maternal aunt, and mother; Hyperlipidemia in her brother; Hypertension in her brother, father, mother, sister, and sister; Lung cancer in her maternal uncle, paternal aunt, and sister; Multiple sclerosis in her  brother;  Other in her mother; Pancreatic cancer (age of onset: 49) in her sister; Prostate cancer in her brother and father; Sarcoidosis in her cousin and sister; Seizures in her maternal uncle and mother. There is no history of Esophageal cancer, Stomach cancer, or Rectal cancer.  ROS:   Please see the history of present illness.    (+) Knee pain (+) Left hip pain (+) Bilateral ankle pain and swelling (+) Shortness of breath All other systems reviewed and are negative.  EKGs/Labs/Other Studies Reviewed:     EKG Interpretation Date/Time:  Thursday March 15 2023 08:45:16 EST Ventricular Rate:  77 PR Interval:  162 QRS Duration:  62 QT Interval:  364 QTC Calculation: 411 R Axis:   61  Text Interpretation: Normal sinus rhythm Normal ECG No significant change since last tracing Confirmed by Chilton Si ([id number]) on 03/15/2023 8:56:44 AM        Echo 03/30/2021:  1. Left ventricular ejection fraction, by estimation, is 60 to 65%. The  left ventricle has normal function. The left ventricle has no regional  wall motion abnormalities. There is mild left ventricular hypertrophy.  Left ventricular diastolic parameters  are consistent with Grade I diastolic dysfunction (impaired relaxation).   2. Right ventricular systolic function is normal. The right ventricular  size is normal.   3. The mitral valve is normal in structure. No evidence of mitral valve  regurgitation. No evidence of mitral stenosis.   4. The aortic valve is normal in structure. Aortic valve regurgitation is  not visualized. No aortic stenosis is present.   5. The inferior vena cava is normal in size with greater than 50%  respiratory variability, suggesting right atrial pressure of 3 mmHg.  CT Calcium Score 01/28/2021: FINDINGS: Coronary arteries: Normal origins.   Coronary Calcium Score:   Left main: 26   Left anterior descending artery: 3.26   Left circumflex artery: 0   Right coronary artery: 50.4   Total:  79.6   Percentile: 82nd   Pericardium: Normal.   Ascending Aorta: Normal caliber. Mild calcification of the aortic root.   Non-cardiac: See separate report from [facility].   IMPRESSION: Coronary calcium score of 79.6. This was 82nd percentile for age-, race-, and sex-matched controls.   Mild calcification of the aortic root.   EKG:  EKG is personally reviewed. 05/18/2021: EKG was not ordered. 03/14/2021 (Dr. Bing Matter): normal sinus rhythm normal P interval possible left atrium enlargement, no evidence of LVH, no ST segment changes  Recent Labs: 02/08/2023: ALT 12; BUN 15; Creatinine, Ser 0.69; Hemoglobin 12.0; Platelets 292.0; Potassium 3.8; Sodium 139; TSH 1.75   Recent Lipid Panel    Component Value Date/Time   CHOL 223 (H) 02/08/2023 1127   TRIG 73.0 02/08/2023 1127   HDL 83.60 02/08/2023 1127   CHOLHDL 3 02/08/2023 1127   VLDL 14.6 02/08/2023 1127   LDLCALC 125 (H) 02/08/2023 1127   LDLDIRECT 102 (H) 10/14/2021 1326   LDLDIRECT 118.9 12/18/2011 1138    Physical Exam:    VS:  BP (!) 152/86   Pulse 84   Ht 5\' 5"  (1.651 m)   Wt 227 lb 11.2 oz (103.3 kg)   SpO2 96%   BMI 37.89 kg/m  , BMI Body mass index is 37.89 kg/m. GENERAL:  Well appearing HEENT: Pupils equal round and reactive, fundi not visualized, oral mucosa unremarkable NECK:  No jugular venous distention, waveform within normal limits, carotid upstroke brisk and symmetric, no bruits, no thyromegaly LUNGS:  Clear to  auscultation bilaterally HEART:  RRR.  PMI not displaced or sustained,S1 and S2 within normal limits, no S3, no S4, no clicks, no rubs, no murmurs ABD:  Flat, positive bowel sounds normal in frequency in pitch, no bruits, no rebound, no guarding, no midline pulsatile mass, no hepatomegaly, no splenomegaly EXT:  2 plus pulses throughout, no edema, no cyanosis no clubbing SKIN:  No rashes no nodules NEURO:  Cranial nerves II through XII grossly intact, motor grossly intact  throughout PSYCH:  Cognitively intact, oriented to person place and time   ASSESSMENT/PLAN:    # Coronary calcification:  # Hyperlipidemia LDL consistently above goal (119-132) over the past 7 years. Discussed the need for pharmacological intervention in addition to lifestyle modifications due to the presence of coronary artery disease. -Start Rosuvastatin 10mg  daily. -Recheck lipid panel and CMP in 2-3 months.  # Hypertension Recent blood pressure readings have been elevated (140s/80s). Patient reports ankle swelling with current medication (Amlodipine 5mg ). -Discontinue Amlodipine. -Start Spironolactone 25mg  daily. -Check basic metabolic panel in 1 week. -Monitor blood pressure at home and follow up in 1 month to reassess blood pressure control.  # Prediabetes Recent HbA1c of 6.4, nearing the threshold for diabetes diagnosis. Discussed the importance of lifestyle modifications, particularly diet and exercise. -Refer to [facility] for dietary guidance and exercise planning.  # Bursitis and Joint Pain Patient reports pain in hip and knees, limiting physical activity. Has an upcoming appointment with orthopedic specialist. -Continue with orthopedic appointment as planned. -Consider physical therapy for guidance on joint-friendly exercises.  # Weight Gain Patient reports weight gain since quitting smoking, which is exacerbating joint pain and potentially contributing to elevated blood pressure and prediabetes. -Encourage participation in [facility] program at the [facility]. -Refer to [facility] for dietary guidance and weight loss planning.  #Grief Patient reports multiple recent losses in the family, causing significant emotional distress. -Encourage use of grief counseling resources, such as those offered by [facility] facility.  # Smoking Cessation Patient has successfully quit smoking. -Congratulate on successful smoking cessation and encourage  continued abstinence.  General Health Maintenance -Continue monitoring blood pressure at home. -Follow up in 6 months for routine care.      Screening for Secondary Hypertension:     05/18/2021    3:28 PM  Causes  Drugs/Herbals Screened     - Comments high sodium.  Limiting caffeine intake.  One soda daily.  Celebrex once per week.  Rare EtOH.  Renovascular HTN N/A  Sleep Apnea Screened     - Comments sleep study  Thyroid Disease Screened  Hyperaldosteronism N/A  Pheochromocytoma N/A  Cushing's Syndrome N/A  Hyperparathyroidism Screened  Coarctation of the Aorta N/A     - Comments Unable to assess.  L breast cancer.  Compliance Screened    Relevant Labs/Studies:    Latest Ref Rng & Units 02/08/2023   11:27 AM 02/09/2022   12:05 PM 03/10/2021   11:01 AM  Basic Labs  Sodium 135 - 145 mEq/L 139  139  138   Potassium 3.5 - 5.1 mEq/L 3.8  4.0  3.7   Creatinine 0.40 - 1.20 mg/dL 1.61  0.96  0.45        Latest Ref Rng & Units 02/08/2023   11:27 AM 12/28/2020   11:38 AM  Thyroid   TSH 0.35 - 5.50 uIU/mL 1.75  1.25     Disposition:    FU with Donetta Isaza C. Duke Salvia, MD, [facility]  PRN.  Medication Adjustments/Labs and Tests Ordered: Current medicines are reviewed at length with the patient today.  Concerns regarding medicines are outlined above.   Orders Placed This Encounter  Procedures   Basic metabolic panel   Lipid panel   Comprehensive metabolic panel   AMB Referral to [facility]   Ambulatory referral to [facility]   EKG 12-Lead   Meds ordered this encounter  Medications   spironolactone (ALDACTONE) 25 MG tablet    Sig: Take 1 tablet (25 mg total) by mouth daily.    Dispense:  90 tablet    Refill:  3    D/C AMLODIPINE   rosuvastatin (CRESTOR) 10 MG tablet    Sig: Take 1 tablet (10 mg total) by mouth daily.    Dispense:  90 tablet    Refill:  3      Signed, Chilton Si, MD  03/15/2023 12:51 PM    [facility]

## 2023-03-15 NOTE — Patient Instructions (Addendum)
Medication Instructions:  STOP AMLODIPINE   START SPIRONOLACTONE 25 MG DAILY   START ROSUVASTATIN 10 MG DAILY   Labwork: BMET IN 1 WEEK   FASTING LP/CMET IN 2 TO 3 MONTHS   Testing/Procedures: NONE  Follow-Up: 1 MONTH WITH PHARM D   6 MONTHS WITH DR [place]   You have been referred to [facility] IF YOU DO NOT HEAR FROM THEM IN 2 WEEKS YOU CAN CALL THEM DIRECTLY AT NUMBER HIGHLIGHTED

## 2023-03-20 ENCOUNTER — Ambulatory Visit: Payer: Commercial Managed Care - PPO | Admitting: Orthopaedic Surgery

## 2023-03-20 ENCOUNTER — Encounter: Payer: Self-pay | Admitting: Orthopaedic Surgery

## 2023-03-20 DIAGNOSIS — M7061 Trochanteric bursitis, right hip: Secondary | ICD-10-CM

## 2023-03-20 DIAGNOSIS — M25561 Pain in right knee: Secondary | ICD-10-CM | POA: Diagnosis not present

## 2023-03-20 DIAGNOSIS — G8929 Other chronic pain: Secondary | ICD-10-CM | POA: Diagnosis not present

## 2023-03-20 MED ORDER — BUPIVACAINE HCL 0.5 % IJ SOLN
3.0000 mL | INTRAMUSCULAR | Status: AC | PRN
  Administered 2023-03-20: 3 mL via INTRA_ARTICULAR

## 2023-03-20 MED ORDER — METHYLPREDNISOLONE ACETATE 40 MG/ML IJ SUSP
40.0000 mg | INTRAMUSCULAR | Status: AC | PRN
  Administered 2023-03-20: 40 mg via INTRA_ARTICULAR

## 2023-03-20 MED ORDER — LIDOCAINE HCL 1 % IJ SOLN
3.0000 mL | INTRAMUSCULAR | Status: AC | PRN
  Administered 2023-03-20: 3 mL

## 2023-03-20 NOTE — Progress Notes (Signed)
Office Visit Note   Patient: Marilyn [name]           Date of Birth: 19-Jun-1955           MRN: [id number] Visit Date: 03/20/2023              Requested by: Tresa Garter, MD [address] [place],  [place] [address] PCP: Plotnikov, Georgina Quint, MD   Assessment & Plan: Visit Diagnoses:  1. Chronic pain of right knee   2. Trochanteric bursitis of right hip     Plan: [name] is a 68 year old female with right hip trochanteric bursitis.  Treatment options given and she elected to undergo cortisone injection.  Home exercises provided.  In regards to the right knee cortisone injection and home exercise program were not effective.  Will order MRI to rule out structural abnormalities.  Follow-Up Instructions: No follow-ups on file.   Orders:  Orders Placed This Encounter  Procedures   MR Knee Right w/o contrast   No orders of the defined types were placed in this encounter.     Procedures: Large Joint Inj: R greater trochanter on 03/20/2023 11:12 AM Indications: pain Details: 22 G needle  Arthrogram: No  Medications: 3 mL lidocaine 1 %; 3 mL bupivacaine 0.5 %; 40 mg methylPREDNISolone acetate 40 MG/ML Patient was prepped and draped in the usual sterile fashion.       Clinical Data: No additional findings.   Subjective: Chief Complaint  Patient presents with   Right Knee - Pain   Right Hip - Pain    HPI Marilyn [name] comes in today for evaluation of right knee pain and right hip pain.  She had a cortisone injection about 6 weeks ago by Hazle Nordmann which helped temporarily.  She is also reporting lateral hip pain that radiates down the side of her thigh. Review of Systems  Constitutional: Negative.   HENT: Negative.    Eyes: Negative.   Respiratory: Negative.    Cardiovascular: Negative.   Endocrine: Negative.   Musculoskeletal: Negative.   Neurological: Negative.   Hematological: Negative.   Psychiatric/Behavioral: Negative.    All other  systems reviewed and are negative.    Objective: Vital Signs: There were no vitals taken for this visit.  Physical Exam Vitals and nursing note reviewed.  Constitutional:      Appearance: She is well-developed.  HENT:     Head: Atraumatic.     Nose: Nose normal.  Eyes:     Extraocular Movements: Extraocular movements intact.  Cardiovascular:     Pulses: Normal pulses.  Pulmonary:     Effort: Pulmonary effort is normal.  Abdominal:     Palpations: Abdomen is soft.  Musculoskeletal:     Cervical back: Neck supple.  Skin:    General: Skin is warm.     Capillary Refill: Capillary refill takes less than 2 seconds.  Neurological:     Mental Status: She is alert. Mental status is at baseline.  Psychiatric:        Behavior: Behavior normal.        Thought Content: Thought content normal.        Judgment: Judgment normal.     Ortho Exam Examination of the right hip shows tenderness to the lateral aspect.  No groin pain with hip range of motion. Specialty Comments:  No specialty comments available.  Imaging: No results found.   PMFS History: Patient Active Problem List   Diagnosis Date Noted   Hypercholesterolemia 05/10/2022  Trochanteric bursitis of right hip 05/10/2022   Preop exam for internal medicine 02/09/2022   Plantar flexed metatarsal bone of right foot 01/11/2022   Grief 09/28/2021   Leg swelling 04/18/2021   Incisional breast wound 04/18/2021   History of sarcoidosis 04/18/2021   History of bronchitis 04/18/2021   GERD (gastroesophageal reflux disease) 04/18/2021   Family history of prostate cancer 04/18/2021   Depression 04/18/2021   Complication of anesthesia 04/18/2021   Colon polyp 04/18/2021   Cigarette smoker 04/18/2021   Cancer (HCC) 04/18/2021   Coronary atherosclerosis 01/30/2021   Aortic atherosclerosis (HCC) 01/30/2021   Primary hypertension 01/14/2021   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   Knee pain, chronic 08/28/2019   Elevated BP  without diagnosis of hypertension 08/28/2019   Callus of foot 08/28/2019   Cholelithiasis 12/26/2018   Genetic testing 12/26/2017   Family history of breast cancer    Family history of pancreatic cancer    Family history of colon cancer    Abdominal pain 07/28/2016   Morbid obesity (HCC) 03/13/2016   Dyspnea 11/09/2015   Rash and nonspecific skin eruption 07/27/2015   Well adult exam 07/23/2014   Sialoadenitis 06/02/2014   Edema 07/07/2013   AB (asthmatic bronchitis) 06/23/2013   Anxiety    Malignant neoplasm of upper-outer quadrant of left breast in female, 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   Allergy 06/06/2011   COLONIC POLYPS 01/25/2010   DIVERTICULOSIS OF COLON 01/25/2010   DEGENERATIVE JOINT DISEASE 11/06/2008   PANIC DISORDER 07/23/2008   Sarcoidosis 10/13/2007   Vitamin D deficiency 10/13/2007   ANEMIA 10/13/2007   BRONCHITIS 10/13/2007   Dyslipidemia 04/23/2007   Adjustment disorder with mixed anxiety and depressed mood 04/23/2007   Venous (peripheral) insufficiency 04/23/2007   HEADACHE 04/23/2007   Past Medical History:  Diagnosis Date   Allergy    Anemia    hx   Anxiety    panic disorder   Breast cancer (HCC) 06/06/2011   bc  left breast 3 o'clock dx=invasive ductal ca uoqER/PR=positive   Cancer (HCC)    BREAST - left   Cigarette smoker    Colon polyp    Complication of anesthesia    DIFFICULTY AWAKENING, BP INCREASE AFTER CHOLECYSTECTEMY    COPD (chronic obstructive pulmonary disease) (HCC)    Depression    Diverticulosis of colon    DJD (degenerative joint disease)    DJD (degenerative joint disease)    Family history of breast cancer    Family history of colon cancer    Family history of pancreatic cancer    Family history of prostate cancer    Gallstones    GERD (gastroesophageal reflux disease)    Pt. denies having GERD. Unable to remove it.   Headache(784.0)    History of anemia    History of bronchitis    History of sarcoidosis     Hyperlipidemia    no meds needed   Incisional breast wound    AT AGE 81 BILATERAL INCISION TO BREAST MADE   Leg swelling    Panic disorder    Personal history of radiation therapy    S/P radiation therapy 08/17/11 - 09/28/11   LLQ - 50 Gy/25 Fractions with Boost of 10 Gy / 5 fractions   Sialoadenitis of submandibular gland 2016   Vitamin D deficiency     Family History  Problem Relation Age of Onset   Dementia Mother    Hypertension Mother    Anemia Mother    Other Mother        +  H Pylori   Seizures Mother    Hypertension Father    Prostate cancer Father        dx over 47   Colon cancer Father 32       diagnosed 2009   Bladder Cancer Father        dx over 66   Colon polyps Father    Hypertension Sister    Breast cancer Sister        dx in her 30s; mat half sister   Pancreatic cancer Sister 90       d. 41   Lung cancer Sister    Hypertension Sister    Sarcoidosis Sister        mat 1/2 sister   Multiple sclerosis Brother        #2   Hypertension Brother        #1   Hyperlipidemia Brother    Prostate cancer Brother        dx under 50   Dementia Maternal Aunt    Dementia Maternal Aunt    Seizures Maternal Uncle    Lung cancer Maternal Uncle    Lung cancer Paternal Aunt    Breast cancer Cousin        pat first cousin d. 29   Sarcoidosis Cousin        mat first cousin   Breast cancer Cousin        maternal 2nd cousins - distant   Brain cancer Other        benign   Esophageal cancer Neg Hx    Stomach cancer Neg Hx    Rectal cancer Neg Hx     Past Surgical History:  Procedure Laterality Date   BIOPSY BREAST     left breast  as a teenager  benign   BREAST BIOPSY Left 06/06/2011   BREAST EXCISIONAL BIOPSY Bilateral    BREAST LUMPECTOMY Left 2013   BREAST SURGERY  07/05/11   left breast lumpectomy with needle loc & axillary sln bx   BUNIONECTOMY  2011   bilateral by Dr Celene Skeen   CHOLECYSTECTOMY  02/2019   COLONOSCOPY     polyp   cyst removed from right  hand  1995   POLYPECTOMY     TONSILLECTOMY  1972   TOTAL ABDOMINAL HYSTERECTOMY  2000   Dr Sharalyn Ink FINGER RELEASE  2008   Dr Mina Marble   Social History   Occupational History   Occupation: retired  Tobacco Use   Smoking status: Former    Current packs/day: 0.00    Types: Cigarettes    Quit date: 12/28/2020    Years since quitting: 2.2   Smokeless tobacco: Never   Tobacco comments:    STOPPED AND RESTARTED  Vaping Use   Vaping status: Never Used  Substance and Sexual Activity   Alcohol use: Yes    Alcohol/week: 0.0 - 1.0 standard drinks of alcohol    Comment: social   Drug use: No   Sexual activity: Not Currently    Birth control/protection: Surgical    Comment: menses age 76,hrt started  2000

## 2023-03-22 ENCOUNTER — Telehealth (HOSPITAL_BASED_OUTPATIENT_CLINIC_OR_DEPARTMENT_OTHER): Payer: Self-pay

## 2023-03-22 NOTE — Telephone Encounter (Signed)
Patient presented to office requesting blood pressure check. Stated her blood pressures have been running high at home regardless of med changes per Dr. Duke Salvia.  Patient's bp when check to L arm was 156/80. BP last night: 201/106  BP this morning: 170/110   Patient made aware that readings will be sent to MD/APP and called back with recommendations. She verbalized understanding.

## 2023-03-22 NOTE — Telephone Encounter (Signed)
BP much better in clinic. Ensure checking at least an hour after medications and sitting 5-10 minutes prior to checking. If she has not previously, would recommend she bring BP cuff to next clinic visit to ensure accuracy.   She had repeat labs today. Would recommend continue her current medications until those labs result tomorrow and we can recommend changes based on those labs. Please inquire if swelling is improved at all since medication change? Has BP been routinely  high or just those two readings?   Alver Sorrow, NP

## 2023-03-22 NOTE — Telephone Encounter (Signed)
Called pt, no answer at this time.  Unable to leave message.  Will call back again tomorrow.

## 2023-03-23 ENCOUNTER — Encounter (HOSPITAL_BASED_OUTPATIENT_CLINIC_OR_DEPARTMENT_OTHER): Payer: Self-pay

## 2023-03-23 DIAGNOSIS — I10 Essential (primary) hypertension: Secondary | ICD-10-CM

## 2023-03-23 LAB — BASIC METABOLIC PANEL
BUN/Creatinine Ratio: 16 (ref 12–28)
BUN: 12 mg/dL (ref 8–27)
CO2: 22 mmol/L (ref 20–29)
Calcium: 10.1 mg/dL (ref 8.7–10.3)
Chloride: 103 mmol/L (ref 96–106)
Creatinine, Ser: 0.75 mg/dL (ref 0.57–1.00)
Glucose: 89 mg/dL (ref 70–99)
Potassium: 4.2 mmol/L (ref 3.5–5.2)
Sodium: 139 mmol/L (ref 134–144)
eGFR: 86 mL/min/{1.73_m2} (ref >59)

## 2023-03-23 MED ORDER — SPIRONOLACTONE 50 MG PO TABS: 50.0000 mg | ORAL_TABLET | Freq: Every day | ORAL | Status: DC

## 2023-03-23 NOTE — Telephone Encounter (Signed)
Spoke with pt, aware of lab results, med change and need for repeat labs in one week. She just got a 3 months supply of the 25 mg tablets, so she will use those first and let us know when refill is needed.  Lab orders mailed to the pt

## 2023-03-23 NOTE — Telephone Encounter (Signed)
Spoke with pt, discussed with patient when discussing lab results.

## 2023-03-26 ENCOUNTER — Encounter: Payer: Self-pay | Admitting: Orthopaedic Surgery

## 2023-03-28 ENCOUNTER — Ambulatory Visit: Payer: Commercial Managed Care - PPO | Admitting: Pulmonary Disease

## 2023-03-28 ENCOUNTER — Ambulatory Visit (HOSPITAL_BASED_OUTPATIENT_CLINIC_OR_DEPARTMENT_OTHER): Payer: Commercial Managed Care - PPO | Admitting: Pulmonary Disease

## 2023-03-28 ENCOUNTER — Encounter: Payer: Self-pay | Admitting: Pulmonary Disease

## 2023-03-28 VITALS — BP 138/78 | HR 78 | Temp 98.0°F | Ht 64.5 in | Wt 226.4 lb

## 2023-03-28 DIAGNOSIS — D869 Sarcoidosis, unspecified: Secondary | ICD-10-CM | POA: Diagnosis not present

## 2023-03-28 DIAGNOSIS — R0600 Dyspnea, unspecified: Secondary | ICD-10-CM

## 2023-03-28 LAB — PULMONARY FUNCTION TEST
DL/VA % pred: 77 %
DL/VA: 3.18 ml/min/mmHg/L
DLCO cor % pred: 72 %
DLCO cor: 14.52 ml/min/mmHg
DLCO unc % pred: 69 %
DLCO unc: 13.86 ml/min/mmHg
FEF 25-75 Post: 1.59 L/s
FEF 25-75 Pre: 0.79 L/s
FEF2575-%Change-Post: 100 %
FEF2575-%Pred-Post: 80 %
FEF2575-%Pred-Pre: 39 %
FEV1-%Change-Post: 22 %
FEV1-%Pred-Post: 81 %
FEV1-%Pred-Pre: 66 %
FEV1-Post: 1.93 L
FEV1-Pre: 1.58 L
FEV1FVC-%Change-Post: 10 %
FEV1FVC-%Pred-Pre: 83 %
FEV6-%Change-Post: 11 %
FEV6-%Pred-Post: 91 %
FEV6-%Pred-Pre: 82 %
FEV6-Post: 2.75 L
FEV6-Pre: 2.47 L
FEV6FVC-%Change-Post: 0 %
FEV6FVC-%Pred-Post: 103 %
FEV6FVC-%Pred-Pre: 103 %
FVC-%Change-Post: 10 %
FVC-%Pred-Post: 88 %
FVC-%Pred-Pre: 80 %
FVC-Post: 2.77 L
FVC-Pre: 2.5 L
Post FEV1/FVC ratio: 70 %
Post FEV6/FVC ratio: 99 %
Pre FEV1/FVC ratio: 63 %
Pre FEV6/FVC Ratio: 99 %
RV % pred: 126 %
RV: 2.79 L
TLC % pred: 105 %
TLC: 5.43 L

## 2023-03-28 NOTE — Patient Instructions (Signed)
Full PFT Performed Today

## 2023-03-28 NOTE — Progress Notes (Addendum)
 Marilyn [name]    [id number]    September 08, 1955  Primary Care Physician:Plotnikov, Karlynn GAILS, MD  Referring Physician: Garald Karlynn GAILS, MD [address] [place],  [place] [address]  Chief complaint:   Cough, shortness of breath  HPI:  History of sarcoidosis diagnosed over 7 years ago  Has been relatively stable Uses albuterol  as needed  No significant exacerbations recently  Reformed smoker quit about 2 years ago  Denies any significant weight loss  Does have chronic musculoskeletal pain and discomfort  No pertinent occupational history  Outpatient Encounter Medications as of 03/28/2023  Medication Sig   albuterol  (VENTOLIN  HFA) 108 (90 Base) MCG/ACT inhaler Inhale 2 puffs into the lungs every 6 (six) hours as needed for wheezing or shortness of breath.   ALPRAZolam  (XANAX ) 1 MG tablet Take 0.5-1 tablets (0.5-1 mg total) by mouth 3 (three) times daily as needed for anxiety.   budesonide -formoterol  (SYMBICORT ) 80-4.5 MCG/ACT inhaler Inhale 2 puffs into the lungs 2 (two) times daily.   busPIRone  (BUSPAR ) 30 MG tablet TAKE 1/2 TO 1 (ONE-HALF TO ONE) TABLET BY MOUTH TWICE DAILY   Cholecalciferol  5000 UNITS TABS Take 1 tablet by mouth 3 (three) times a week.   clobetasol  cream (TEMOVATE ) 0.05 % Apply 1 application. topically 2 (two) times daily.   diclofenac  Sodium (VOLTAREN ) 1 % GEL Apply 4 g topically 4 (four) times daily. (Patient taking differently: Apply 4 g topically as needed (Joint pain).)   Multiple Vitamin (MULTIVITAMIN WITH MINERALS) TABS tablet Take 1 tablet by mouth daily. Unknown strength   rosuvastatin  (CRESTOR ) 10 MG tablet Take 1 tablet (10 mg total) by mouth daily.   spironolactone  (ALDACTONE ) 50 MG tablet Take 1 tablet (50 mg total) by mouth daily.   triamcinolone  ointment (KENALOG ) 0.1 % Apply 1 Application topically 3 (three) times daily.   [DISCONTINUED] celecoxib  (CELEBREX ) 200 MG capsule TAKE 1 CAPSULE (200 MG TOTAL) BY MOUTH DAILY AS  NEEDED FOR MODERATE PAIN. (Patient not taking: Reported on 03/28/2023)   [DISCONTINUED] cetirizine  (ZYRTEC  ALLERGY) 10 MG tablet Take 1 tablet (10 mg total) by mouth daily. (Patient not taking: Reported on 03/28/2023)   [DISCONTINUED] ipratropium (ATROVENT ) 0.03 % nasal spray Place 2 sprays into both nostrils 2 (two) times daily as needed for rhinitis. (Patient not taking: Reported on 03/28/2023)   [DISCONTINUED] pantoprazole  (PROTONIX ) 40 MG tablet Take 1 tablet (40 mg total) by mouth daily. (Patient not taking: Reported on 03/28/2023)   No facility-administered encounter medications on file as of 03/28/2023.    Allergies as of 03/28/2023 - Review Complete 03/28/2023  Allergen Reaction Noted   Lexapro [escitalopram oxalate]  08/02/2017   Microzide  [hydrochlorothiazide ]  01/31/2021   Penicillins Rash and Other (See Comments)     Past Medical History:  Diagnosis Date   Allergy    Anemia    hx   Anxiety    panic disorder   Breast cancer (HCC) 06/06/2011   bc  left breast 3 o'clock dx=invasive ductal ca uoqER/PR=positive   Cancer (HCC)    BREAST - left   Cigarette smoker    Colon polyp    Complication of anesthesia    DIFFICULTY AWAKENING, BP INCREASE AFTER CHOLECYSTECTEMY    COPD (chronic obstructive pulmonary disease) (HCC)    Depression    Diverticulosis of colon    DJD (degenerative joint disease)    DJD (degenerative joint disease)    Family history of breast cancer    Family history of  colon cancer    Family history of pancreatic cancer    Family history of prostate cancer    Gallstones    GERD (gastroesophageal reflux disease)    Pt. denies having GERD. Unable to remove it.   Headache(784.0)    History of anemia    History of bronchitis    History of sarcoidosis    Hyperlipidemia    no meds needed   Incisional breast wound    AT AGE 68 BILATERAL INCISION TO BREAST MADE   Leg swelling    Panic disorder    Personal history of radiation therapy    S/P radiation therapy  08/17/11 - 09/28/11   LLQ - 50 Gy/25 Fractions with Boost of 10 Gy / 5 fractions   Sialoadenitis of submandibular gland 2016   Vitamin D  deficiency     Past Surgical History:  Procedure Laterality Date   BIOPSY BREAST     left breast  as a teenager  benign   BREAST BIOPSY Left 06/06/2011   BREAST EXCISIONAL BIOPSY Bilateral    BREAST LUMPECTOMY Left 2013   BREAST SURGERY  07/05/11   left breast lumpectomy with needle loc & axillary sln bx   BUNIONECTOMY  2011   bilateral by Dr Lea   CHOLECYSTECTOMY  02/2019   COLONOSCOPY     polyp   cyst removed from right hand  1995   POLYPECTOMY     TONSILLECTOMY  1972   TOTAL ABDOMINAL HYSTERECTOMY  2000   Dr Rosalynn ACE FINGER RELEASE  2008   Dr Sissy    Family History  Problem Relation Age of Onset   Dementia Mother    Hypertension Mother    Anemia Mother    Other Mother        + H Pylori   Seizures Mother    Hypertension Father    Prostate cancer Father        dx over 60   Colon cancer Father 25       diagnosed 2009   Bladder Cancer Father        dx over 69   Colon polyps Father    Hypertension Sister    Breast cancer Sister        dx in her 30s; mat half sister   Pancreatic cancer Sister 69       d. 45   Lung cancer Sister    Hypertension Sister    Sarcoidosis Sister        mat 1/2 sister   Multiple sclerosis Brother        #2   Hypertension Brother        #1   Hyperlipidemia Brother    Prostate cancer Brother        dx under 50   Dementia Maternal Aunt    Dementia Maternal Aunt    Seizures Maternal Uncle    Lung cancer Maternal Uncle    Lung cancer Paternal Aunt    Breast cancer Cousin        pat first cousin d. 60   Sarcoidosis Cousin        mat first cousin   Breast cancer Cousin        maternal 2nd cousins - distant   Brain cancer Other        benign   Esophageal cancer Neg Hx    Stomach cancer Neg Hx    Rectal cancer Neg Hx     Social History   Socioeconomic  History   Marital status:  Married    Spouse name: Not on file   Number of children: 2   Years of education: Not on file   Highest education level: Bachelor's degree (e.g., BA, AB, BS)  Occupational History   Occupation: retired  Tobacco Use   Smoking status: Former    Current packs/day: 0.00    Types: Cigarettes    Quit date: 12/28/2020    Years since quitting: 2.2   Smokeless tobacco: Never   Tobacco comments:    STOPPED AND RESTARTED  Vaping Use   Vaping status: Never Used  Substance and Sexual Activity   Alcohol use: Yes    Alcohol/week: 0.0 - 1.0 standard drinks of alcohol    Comment: social   Drug use: No   Sexual activity: Not Currently    Birth control/protection: Surgical    Comment: menses age 56,hrt started  12/10/1998  Other Topics Concern   Not on file  Social History Narrative   Widowed - husband died Dec 10, 2003   2 step-children   Exercises 3x per week   2 cups of caffeine daily         Social Drivers of Corporate investment banker Strain: Low Risk  (08/07/2022)   Overall Financial Resource Strain (CARDIA)    Difficulty of Paying Living Expenses: Not very hard  Food Insecurity: No Food Insecurity (08/07/2022)   Hunger Vital Sign    Worried About Running Out of Food in the Last Year: Never true    Ran Out of Food in the Last Year: Never true  Transportation Needs: No Transportation Needs (08/07/2022)   PRAPARE - Administrator, Civil Service (Medical): No    Lack of Transportation (Non-Medical): No  Physical Activity: Unknown (08/07/2022)   Exercise Vital Sign    Days of Exercise per Week: 0 days    Minutes of Exercise per Session: Not on file  Stress: No Stress Concern Present (08/07/2022)   [employer] of Occupational Health - Occupational Stress Questionnaire    Feeling of Stress : Not at all  Social Connections: Socially Integrated (08/07/2022)   Social Connection and Isolation Panel [NHANES]    Frequency of Communication with Friends and Family: Three times a week     Frequency of Social Gatherings with Friends and Family: Once a week    Attends Religious Services: More than 4 times per year    Active Member of [employer] or Organizations: Yes    Attends Banker Meetings: 1 to 4 times per year    Marital Status: Married  Catering manager Violence: Not on file    Review of Systems  Respiratory:  Positive for cough and shortness of breath.     Vitals:   03/28/23 1016  BP: 138/78  Pulse: 78  Temp: 98 F (36.7 C)  SpO2: 95%     Physical Exam Constitutional:      Appearance: She is obese.  HENT:     Head: Normocephalic.     Mouth/Throat:     Mouth: Mucous membranes are moist.  Eyes:     General: No scleral icterus.    Pupils: Pupils are equal, round, and reactive to light.  Cardiovascular:     Rate and Rhythm: Normal rate and regular rhythm.     Heart sounds: No murmur heard.    No friction rub.  Pulmonary:     Effort: No respiratory distress.     Breath sounds: No stridor. No wheezing  or rhonchi.  Musculoskeletal:     Cervical back: No rigidity or tenderness.  Neurological:     Mental Status: She is alert.  Psychiatric:        Mood and Affect: Mood normal.    Data Reviewed: Dr. Leonides records from 2019 reviewed  Last PFT was in 2017-no obstructive pathology  No recent CT scan of the chest  PFT shows significant bronchodilator response, reviewed with the patient   Assessment:  History of Sarcoidosis  Stable symptoms  Shortness of breath on exertion  Chronic cough -Stable Plan/Recommendations:  Continue albuterol  as needed  Follow-up in 6 months  Encouraged to call with significant concerns follow-up in about 6 months  Encouraged to call with any concerns  Provide her with a spacer today  Jennet Epley MD [facility] 03/28/2023, 10:27 AM  CC: Plotnikov, Karlynn GAILS, MD

## 2023-03-28 NOTE — Progress Notes (Signed)
Full PFT Performed Today

## 2023-03-28 NOTE — Patient Instructions (Signed)
Continue using albuterol as needed  Your breathing study today does show that albuterol does help  We will provide you with a spacer  I will see you in about 6 months  Call us with significant concerns  Exercise program will definitely help your activity levels

## 2023-03-29 ENCOUNTER — Encounter: Payer: Self-pay | Admitting: Orthopaedic Surgery

## 2023-03-30 LAB — BASIC METABOLIC PANEL
BUN/Creatinine Ratio: 13 (ref 12–28)
BUN: 11 mg/dL (ref 8–27)
CO2: 23 mmol/L (ref 20–29)
Calcium: 10.3 mg/dL (ref 8.7–10.3)
Chloride: 102 mmol/L (ref 96–106)
Creatinine, Ser: 0.84 mg/dL (ref 0.57–1.00)
Glucose: 109 mg/dL — ABNORMAL HIGH (ref 70–99)
Potassium: 4.5 mmol/L (ref 3.5–5.2)
Sodium: 140 mmol/L (ref 134–144)
eGFR: 75 mL/min/{1.73_m2} (ref >59)

## 2023-03-31 ENCOUNTER — Encounter (HOSPITAL_BASED_OUTPATIENT_CLINIC_OR_DEPARTMENT_OTHER): Payer: Self-pay

## 2023-04-01 ENCOUNTER — Ambulatory Visit
Admission: RE | Admit: 2023-04-01 | Discharge: 2023-04-01 | Disposition: A | Discharge status: Home / Self Care | Payer: Commercial Managed Care - PPO | Source: Ambulatory Visit | Attending: Orthopaedic Surgery | Admitting: Orthopaedic Surgery

## 2023-04-01 DIAGNOSIS — G8929 Other chronic pain: Secondary | ICD-10-CM

## 2023-04-09 ENCOUNTER — Ambulatory Visit: Payer: No Typology Code available for payment source | Admitting: Pulmonary Disease

## 2023-04-10 NOTE — Progress Notes (Signed)
Tried to call. No answer. LMOM for patient to call us to schedule follow up.

## 2023-04-11 ENCOUNTER — Encounter (HOSPITAL_BASED_OUTPATIENT_CLINIC_OR_DEPARTMENT_OTHER): Payer: No Typology Code available for payment source | Admitting: Cardiovascular Disease

## 2023-04-13 ENCOUNTER — Encounter: Payer: Self-pay | Admitting: Orthopaedic Surgery

## 2023-04-13 ENCOUNTER — Ambulatory Visit: Payer: Commercial Managed Care - PPO | Admitting: Orthopaedic Surgery

## 2023-04-13 DIAGNOSIS — M1711 Unilateral primary osteoarthritis, right knee: Secondary | ICD-10-CM

## 2023-04-13 NOTE — Progress Notes (Signed)
Office Visit Note   Patient: Marilyn [name]           Date of Birth: 1955-09-11           MRN: [id number] Visit Date: 04/13/2023              Requested by: Tresa Garter, MD [address] [place],  [place] [address] PCP: Plotnikov, Georgina Quint, MD   Assessment & Plan: Visit Diagnoses:  1. Primary osteoarthritis of right knee     Plan: MRI of the right knee reviewed with the patient in detail which shows tricompartmental advanced degenerative changes with widespread degenerative spurring and joint space narrowing.  Based on these findings I have recommended a right total knee replacement.  Risk benefits prognosis reviewed.  Once we have the necessary clearances Eunice Blase will call her to confirm surgery date.  Impression is severe right knee degenerative joint disease secondary to Osteoarthritis.  Bone on bone joint space narrowing is seen on radiographs with mild varus alignment.  At this point, conservative treatments fail to provide any significant relief and the pain is severely affecting ADLs and quality of life.  Based on treatment options, the patient has elected to move forward with a knee replacement.  We have discussed the surgical risks that include but are not limited to infection, DVT, leg length discrepancy, stiffness, numbness, tingling, incomplete relief of pain.  Recovery and prognosis were also reviewed.    Anticoagulants: No antithrombotic Postop anticoagulation: Xarelto Diabetic: No  Nickel allergy: No Prior DVT/PE: No Tobacco use: No Clearances needed for surgery: Plotnikov Anticipated discharge dispo: Home   Total face to face encounter time was greater than 25 minutes and over half of this time was spent in counseling and/or coordination of care.  Follow-Up Instructions: No follow-ups on file.   Orders:  No orders of the defined types were placed in this encounter.  No orders of the defined types were placed in this encounter.      Procedures: No procedures performed   Clinical Data: No additional findings.   Subjective: Chief Complaint  Patient presents with   Right Knee - Follow-up    MRI review    HPI Reynalda returns today to discuss right knee MRI scan. Review of Systems  Constitutional: Negative.   HENT: Negative.    Eyes: Negative.   Respiratory: Negative.    Cardiovascular: Negative.   Endocrine: Negative.   Musculoskeletal:  Positive for arthralgias, gait problem and joint swelling.  Hematological: Negative.   Psychiatric/Behavioral: Negative.    All other systems reviewed and are negative.    Objective: Vital Signs: There were no vitals taken for this visit.  Physical Exam Vitals and nursing note reviewed.  Constitutional:      Appearance: She is well-developed.  HENT:     Head: Atraumatic.     Nose: Nose normal.  Eyes:     Extraocular Movements: Extraocular movements intact.  Cardiovascular:     Pulses: Normal pulses.  Pulmonary:     Effort: Pulmonary effort is normal.  Abdominal:     Palpations: Abdomen is soft.  Musculoskeletal:     Cervical back: Neck supple.  Skin:    General: Skin is warm.     Capillary Refill: Capillary refill takes less than 2 seconds.  Neurological:     Mental Status: She is alert. Mental status is at baseline.  Psychiatric:        Behavior: Behavior normal.  Thought Content: Thought content normal.        Judgment: Judgment normal.     Ortho Exam Examination of the right knee shows crepitus with range of motion.  Range of motion is 0 to 95 degrees with pain.  Collaterals and cruciates are stable. Specialty Comments:  No specialty comments available.  Imaging: No results found.   PMFS History: Patient Active Problem List   Diagnosis Date Noted   Hypercholesterolemia 05/10/2022   Trochanteric bursitis of right hip 05/10/2022   Preop exam for internal medicine 02/09/2022   Plantar flexed metatarsal bone of right foot 01/11/2022    Grief 09/28/2021   Leg swelling 04/18/2021   Incisional breast wound 04/18/2021   History of sarcoidosis 04/18/2021   History of bronchitis 04/18/2021   GERD (gastroesophageal reflux disease) 04/18/2021   Family history of prostate cancer 04/18/2021   Depression 04/18/2021   Complication of anesthesia 04/18/2021   Colon polyp 04/18/2021   Cigarette smoker 04/18/2021   Cancer (HCC) 04/18/2021   Coronary atherosclerosis 01/30/2021   Aortic atherosclerosis (HCC) 01/30/2021   Primary hypertension 01/14/2021   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   Knee pain, chronic 08/28/2019   Elevated BP without diagnosis of hypertension 08/28/2019   Callus of foot 08/28/2019   Cholelithiasis 12/26/2018   Genetic testing 12/26/2017   Family history of breast cancer    Family history of pancreatic cancer    Family history of colon cancer    Abdominal pain 07/28/2016   Morbid obesity (HCC) 03/13/2016   Dyspnea 11/09/2015   Rash and nonspecific skin eruption 07/27/2015   Well adult exam 07/23/2014   Sialoadenitis 06/02/2014   Edema 07/07/2013   AB (asthmatic bronchitis) 06/23/2013   Anxiety    Malignant neoplasm of upper-outer quadrant of left breast in female, 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   Allergy 06/06/2011   COLONIC POLYPS 01/25/2010   DIVERTICULOSIS OF COLON 01/25/2010   DEGENERATIVE JOINT DISEASE 11/06/2008   PANIC DISORDER 07/23/2008   Sarcoidosis 10/13/2007   Vitamin D deficiency 10/13/2007   ANEMIA 10/13/2007   BRONCHITIS 10/13/2007   Dyslipidemia 04/23/2007   Adjustment disorder with mixed anxiety and depressed mood 04/23/2007   Venous (peripheral) insufficiency 04/23/2007   HEADACHE 04/23/2007   Past Medical History:  Diagnosis Date   Allergy    Anemia    hx   Anxiety    panic disorder   Breast cancer (HCC) 06/06/2011   bc  left breast 3 o'clock dx=invasive ductal ca uoqER/PR=positive   Cancer (HCC)    BREAST - left   Cigarette smoker    Colon polyp     Complication of anesthesia    DIFFICULTY AWAKENING, BP INCREASE AFTER CHOLECYSTECTEMY    COPD (chronic obstructive pulmonary disease) (HCC)    Depression    Diverticulosis of colon    DJD (degenerative joint disease)    DJD (degenerative joint disease)    Family history of breast cancer    Family history of colon cancer    Family history of pancreatic cancer    Family history of prostate cancer    Gallstones    GERD (gastroesophageal reflux disease)    Pt. denies having GERD. Unable to remove it.   Headache(784.0)    History of anemia    History of bronchitis    History of sarcoidosis    Hyperlipidemia    no meds needed   Incisional breast wound    AT AGE 88 BILATERAL INCISION TO BREAST MADE   Leg swelling  Panic disorder    Personal history of radiation therapy    S/P radiation therapy 08/17/11 - 09/28/11   LLQ - 50 Gy/25 Fractions with Boost of 10 Gy / 5 fractions   Sialoadenitis of submandibular gland 2016   Vitamin D deficiency     Family History  Problem Relation Age of Onset   Dementia Mother    Hypertension Mother    Anemia Mother    Other Mother        + H Pylori   Seizures Mother    Hypertension Father    Prostate cancer Father        dx over 46   Colon cancer Father 31       diagnosed 2009   Bladder Cancer Father        dx over 63   Colon polyps Father    Hypertension Sister    Breast cancer Sister        dx in her 30s; mat half sister   Pancreatic cancer Sister 45       d. 35   Lung cancer Sister    Hypertension Sister    Sarcoidosis Sister        mat 1/2 sister   Multiple sclerosis Brother        #2   Hypertension Brother        #1   Hyperlipidemia Brother    Prostate cancer Brother        dx under 50   Dementia Maternal Aunt    Dementia Maternal Aunt    Seizures Maternal Uncle    Lung cancer Maternal Uncle    Lung cancer Paternal Aunt    Breast cancer Cousin        pat first cousin d. 30   Sarcoidosis Cousin        mat first cousin    Breast cancer Cousin        maternal 2nd cousins - distant   Brain cancer Other        benign   Esophageal cancer Neg Hx    Stomach cancer Neg Hx    Rectal cancer Neg Hx     Past Surgical History:  Procedure Laterality Date   BIOPSY BREAST     left breast  as a teenager  benign   BREAST BIOPSY Left 06/06/2011   BREAST EXCISIONAL BIOPSY Bilateral    BREAST LUMPECTOMY Left 2013   BREAST SURGERY  07/05/11   left breast lumpectomy with needle loc & axillary sln bx   BUNIONECTOMY  2011   bilateral by Dr Celene Skeen   CHOLECYSTECTOMY  02/2019   COLONOSCOPY     polyp   cyst removed from right hand  1995   POLYPECTOMY     TONSILLECTOMY  1972   TOTAL ABDOMINAL HYSTERECTOMY  2000   Dr Sharalyn Ink FINGER RELEASE  2008   Dr Mina Marble   Social History   Occupational History   Occupation: retired  Tobacco Use   Smoking status: Former    Current packs/day: 0.00    Types: Cigarettes    Quit date: 12/28/2020    Years since quitting: 2.2   Smokeless tobacco: Never   Tobacco comments:    STOPPED AND RESTARTED  Vaping Use   Vaping status: Never Used  Substance and Sexual Activity   Alcohol use: Yes    Alcohol/week: 0.0 - 1.0 standard drinks of alcohol    Comment: social   Drug use: No   Sexual  activity: Not Currently    Birth control/protection: Surgical    Comment: menses age 43,hrt started  2000

## 2023-04-16 ENCOUNTER — Ambulatory Visit
Payer: Commercial Managed Care - PPO | Attending: Cardiovascular Disease | Admitting: Pharmacist Clinician (PhC)/ Clinical Pharmacy Specialist

## 2023-04-16 ENCOUNTER — Encounter: Payer: Self-pay | Admitting: Pharmacist Clinician (PhC)/ Clinical Pharmacy Specialist

## 2023-04-16 ENCOUNTER — Ambulatory Visit (HOSPITAL_BASED_OUTPATIENT_CLINIC_OR_DEPARTMENT_OTHER): Payer: Commercial Managed Care - PPO

## 2023-04-16 VITALS — BP 132/83 | HR 76

## 2023-04-16 DIAGNOSIS — I10 Essential (primary) hypertension: Secondary | ICD-10-CM

## 2023-04-16 NOTE — Patient Instructions (Signed)
 Follow up appointment: with Dr. Duke Salvia on June 24  Take your BP meds as follows:  Continue with spironolactone 50 mg once daily  Check your blood pressure at home daily (if able) and keep record of the readings.  Your blood pressure goal is < 130/80  To check your pressure at home you will need to:  1. Sit up in a chair, with feet flat on the floor and back supported. Do not cross your ankles or legs. 2. Rest your left arm so that the cuff is about heart level. If the cuff goes on your upper arm,  then just relax the arm on the table, arm of the chair or your lap. If you have a wrist cuff, we  suggest relaxing your wrist against your chest (think of it as Pledging the Flag with the  wrong arm).  3. Place the cuff snugly around your arm, about 1 inch above the crook of your elbow. The  cords should be inside the groove of your elbow.  4. Sit quietly, with the cuff in place, for about 5 minutes. After that 5 minutes press the power  button to start a reading. 5. Do not talk or move while the reading is taking place.  6. Record your readings on a sheet of paper. Although most cuffs have a memory, it is often  easier to see a pattern developing when the numbers are all in front of you.  7. You can repeat the reading after 1-3 minutes if it is recommended  Make sure your bladder is empty and you have not had caffeine or tobacco within the last 30 min  Always bring your blood pressure log with you to your appointments. If you have not brought your monitor in to be double checked for accuracy, please bring it to your next appointment.  You can find a list of quality blood pressure cuffs at [email or web address]  Important lifestyle changes to control high blood pressure  Intervention  Effect on the BP  Lose extra pounds and watch your waistline Weight loss is one of the most effective lifestyle changes for controlling blood pressure. If you're overweight or obese, losing even a small amount of  weight can help reduce blood pressure. Blood pressure might go down by about 1 millimeter of mercury (mm Hg) with each kilogram (about 2.2 pounds) of weight lost.  Exercise regularly As a general goal, aim for at least 30 minutes of moderate physical activity every day. Regular physical activity can lower high blood pressure by about 5 to 8 mm Hg.  Eat a healthy diet Eating a diet rich in whole grains, fruits, vegetables, and low-fat dairy products and low in saturated fat and cholesterol. A healthy diet can lower high blood pressure by up to 11 mm Hg.  Reduce salt (sodium) in your diet Even a small reduction of sodium in the diet can improve heart health and reduce high blood pressure by about 5 to 6 mm Hg.  Limit alcohol One drink equals 12 ounces of beer, 5 ounces of wine, or 1.5 ounces of 80-proof liquor.  Limiting alcohol to less than one drink a day for women or two drinks a day for men can help lower blood pressure by about 4 mm Hg.   If you have any questions or concerns please use My Chart to send questions or call the office at [phone]

## 2023-04-16 NOTE — Progress Notes (Unsigned)
 Office Visit    Patient Name: Marilyn [name] Date of Encounter: 04/16/2023  Primary Care Provider:  Tresa Garter, MD Primary Cardiologist:  None  Chief Complaint    Hypertension  Significant Past Medical History   HTN Currently controlled on Spironolactone 50 mg, swelling with Amlodipine  OSA Uncontrolled, no CPAP   HLD Rosuvastatin 10 mg, LDL 125 (12/24), CAC 82nd percentile  DM Prediabetic A1c 6.4 (12/24)    Allergies  Allergen Reactions   Lexapro [Escitalopram Oxalate]     Felt like zombie   Microzide [Hydrochlorothiazide]     Palpitations   Penicillins Rash and Other (See Comments)    At injection site.    History of Present Illness    Marilyn [name] is a 68 y.o. female with a hx of coronary calcification, OSA, prior tobacco abuse, pre-diabetes, hypertension, hyperlipidemia, left breast cancer s/p radiation therapy, COPD, sarcoidosis, and anemia. Pt was previously on HCTZ, Bystolic, and Amlodipine. Amlodipine was stopped due to swelling and the patient was started on Spironolactone.   BP goal: 130/80  Current BP meds: Spironolactone 50 mg  Previously tried/Allergies: Swelling on Amlodipine, Palpitations on HCTZ  Family Hx: Both parents had heart conditions (unknown)  Social Hx:  Tobacco: Quit 3 years ago  Alc: occasionally  Caffeine: 1 cup coffee in the morning  5/7 days a week, 2 sodas in past week  Diet: [facility], eat out often, chicken or hamburgers, trying to reduce, not plenty of water, try not to add salt  Exercise: Trying to increase, Getting a knee replacement soon  Home BP readings: Average 141/83 from the last month, Trending down 129/75 was last week's average.   Adherence: No problems, uses pill box  Accessory Clinical Findings    Lab Results  Component Value Date   CREATININE 0.84 03/30/2023   BUN 11 03/30/2023   NA 140 03/30/2023   K 4.5 03/30/2023   CL 102 03/30/2023   CO2 23 03/30/2023    Lab Results  Component Value Date   ALT 12 02/08/2023   AST 14 02/08/2023   ALKPHOS 83 02/08/2023   BILITOT 0.4 02/08/2023   Lab Results  Component Value Date   HGBA1C 6.4 02/08/2023    Home Medications    Current Outpatient Medications  Medication Sig Dispense Refill   albuterol (VENTOLIN HFA) 108 (90 Base) MCG/ACT inhaler Inhale 2 puffs into the lungs every 6 (six) hours as needed for wheezing or shortness of breath. 8 g 6   ALPRAZolam (XANAX) 1 MG tablet Take 0.5-1 tablets (0.5-1 mg total) by mouth 3 (three) times daily as needed for anxiety. 90 tablet 3   budesonide-formoterol (SYMBICORT) 80-4.5 MCG/ACT inhaler Inhale 2 puffs into the lungs 2 (two) times daily. 1 each 11   busPIRone (BUSPAR) 30 MG tablet TAKE 1/2 TO 1 (ONE-HALF TO ONE) TABLET BY MOUTH TWICE DAILY 60 tablet 2   Cholecalciferol 5000 UNITS TABS Take 1 tablet by mouth 3 (three) times a week.     clobetasol cream (TEMOVATE) 0.05 % Apply 1 application. topically 2 (two) times daily. 60 g 3   diclofenac Sodium (VOLTAREN) 1 % GEL Apply 4 g topically 4 (four) times daily. (Patient taking differently: Apply 4 g topically as needed (Joint pain).)     Multiple Vitamin (MULTIVITAMIN WITH MINERALS) TABS tablet Take 1 tablet by mouth daily. Unknown strength     rosuvastatin (CRESTOR) 10 MG tablet Take 1 tablet (10 mg total) by mouth daily. 90 tablet  3   spironolactone (ALDACTONE) 50 MG tablet Take 1 tablet (50 mg total) by mouth daily.     triamcinolone ointment (KENALOG) 0.1 % Apply 1 Application topically 3 (three) times daily. 80 g 2   No current facility-administered medications for this visit.         Assessment & Plan    Primary hypertension Goal <130/80 BP today: 132/83 today, 76 BPM Continue Spironolactone 50 mg QD No changes today Follow up with Dr. Duke Salvia in June   Burt Knack PharmD Candidate [facility] [employer], Class of 2025  I was with student and patient for entire visit  and agree with above assessment and plan.   Phillips Hay PharmD CPP [facility]  [address] [place], [place] [address] [phone]

## 2023-04-16 NOTE — Assessment & Plan Note (Signed)
 Goal <130/80 BP today: 132/83 today, 76 BPM Continue Spironolactone 50 mg QD No changes today Follow up with Dr. Duke Salvia in June

## 2023-04-17 ENCOUNTER — Encounter (INDEPENDENT_AMBULATORY_CARE_PROVIDER_SITE_OTHER): Payer: Self-pay | Admitting: Family Medicine

## 2023-04-17 ENCOUNTER — Ambulatory Visit (INDEPENDENT_AMBULATORY_CARE_PROVIDER_SITE_OTHER): Payer: Commercial Managed Care - PPO | Admitting: Family Medicine

## 2023-04-17 VITALS — BP 136/80 | HR 82 | Temp 98.2°F | Ht 64.0 in | Wt 226.0 lb

## 2023-04-17 DIAGNOSIS — E785 Hyperlipidemia, unspecified: Secondary | ICD-10-CM | POA: Diagnosis not present

## 2023-04-17 DIAGNOSIS — Z6838 Body mass index (BMI) 38.0-38.9, adult: Secondary | ICD-10-CM | POA: Diagnosis not present

## 2023-04-17 DIAGNOSIS — Z0289 Encounter for other administrative examinations: Secondary | ICD-10-CM

## 2023-04-17 DIAGNOSIS — I10 Essential (primary) hypertension: Secondary | ICD-10-CM

## 2023-04-17 NOTE — Progress Notes (Signed)
 Marilyn Grippe, DO, ABFM, ABOM Bariatric physician [address] [place], [place], [place] [address] Office: [phone]  /  Fax: [phone]   Initial Evaluation:  Marilyn [name] was seen in clinic today to evaluate for obesity. She is interested in losing weight to improve overall health and reduce the risk of weight related complications. She presents today to review program treatment options, initial physical assessment, and evaluation.       She was referred by: Specialist - Dr.Heidelberg  When asked how has your weight affected you? 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)   Current nutrition plan: None  Current level of physical activity: None  Current or previous pharmacotherapy: None  Response to medication: Never tried medications  @MEDCOMM @    Past Medical History:  Diagnosis Date   Allergy    Anemia    hx   Anxiety    panic disorder   Breast cancer (HCC) 06/06/2011   bc  left breast 3 o'clock dx=invasive ductal ca uoqER/PR=positive   Cancer (HCC)    BREAST - left   Cigarette smoker    Colon polyp    Complication of anesthesia    DIFFICULTY AWAKENING, BP INCREASE AFTER CHOLECYSTECTEMY    COPD (chronic obstructive pulmonary disease) (HCC)    Depression    Diverticulosis of colon    DJD (degenerative joint disease)    DJD (degenerative joint disease)    Family history of breast cancer    Family history of colon cancer    Family history of pancreatic cancer    Family history of prostate cancer    Gallstones    GERD (gastroesophageal reflux disease)     Pt. denies having GERD. Unable to remove it.   Headache(784.0)    History of anemia    History of bronchitis    History of sarcoidosis    Hyperlipidemia    no meds needed   Incisional breast wound    AT AGE 54 BILATERAL INCISION TO BREAST MADE   Leg swelling    Panic disorder    Personal history of radiation therapy    S/P radiation therapy 08/17/11 - 09/28/11   LLQ - 50 Gy/25 Fractions with Boost of 10 Gy / 5 fractions   Sialoadenitis of submandibular gland 2016   Vitamin D deficiency      Objective:  BP 136/80   Pulse 82   Temp 98.2 F (36.8 C)   Ht 5\' 4"  (1.626 m)   Wt 226 lb (102.5 kg)   SpO2 99%   BMI 38.79 kg/m  She was weighed on the bioimpedance scale: Body mass index is 38.79 kg/m.  Visceral Fat %: 16, Body Fat %: 49.6  No data recorded No data recorded   Vitals Temp: 98.2 F (36.8 C) BP: 136/80 Pulse Rate: 82 SpO2: 99 %   Anthropometric Measurements Height: 5\' 4"  (1.626 m) Weight: 226 lb (102.5 kg) BMI (Calculated): 38.77 Peak Weight: 227 lb   Body Composition  Body Fat %: 49.7 % Fat Mass (lbs): 112.4 lbs Muscle Mass (lbs): 107.8 lbs Total Body Water (lbs): 80.8 lbs Visceral Fat Rating : 16   Other Clinical Data Today's  Visit #: Info visit    General: Well Developed, well nourished, and in no acute distress.  HEENT: Normocephalic, atraumatic; EOMI, sclerae are anicteric. Skin: Warm and dry, good turgor Chest:  Normal excursion, shape, no gross ABN Respiratory: No conversational dyspnea; speaking in full sentences NeuroM-Sk:  Normal gross ROM * 4 extremities  Psych: A and O *3, insight adequate, mood- full    Assessment and Plan:   FOR THE DISEASE OF OBESITY: Morbid obesity (HCC) Assessment & Plan: We reviewed anthropometrics, biometrics, associated medical conditions and contributing factors with patient.   Ngina would benefit from a medically tailored reduced calorie nutrional plan based on his REE (resting energy  expenditure), which will be determined by indirect calorimetry.  We will also assess for cardiometabolic risk and nutritional derangements via fasting labs at intake appointment.    Obesity Treatment / Action Plan:   she was weighed on the bioimpedance scale and results were discussed and documented in the synopsis.   Audie Pinto Muela will complete provided nutritional and psychosocial assessment questionnaire before the next appointment.  she will be scheduled for indirect calorimetry to determine resting energy expenditure in a fasting state.  This will allow [place] to create a reduced calorie, high-protein meal plan to promote loss of fat mass while preserving muscle mass.  We will also assess for cardiometabolic risk and nutritional derangements via an ECG and fasting serologies at her next appointment.  she was encouraged to work on amassing support from family and friends to begin their weight loss journey.   Work on eliminating or reducing the presence of highly processed, poorly nutritious, calorie-dense foods in the home.   Obesity Education Performed Today:  Patient was counseled on nutritional approaches to weight loss and benefits of reducing processed foods and consuming plant-based foods and high quality protein as part of nutritional weight management program.   We discussed the importance of long term lifestyle changes which include nutrition, exercise and behavioral modifications as well as the importance of customizing this to her specific health and social needs.   We discussed the benefits of reaching a healthier weight to alleviate the symptoms of existing conditions and reduce the risks of the biomechanical, metabolic and psychological effects of obesity.  Was counseled on the health benefits of losing 5%-10% of total body weight.  Was counseled on our cognitive behavorial therapy program, lead by our bariatric psychologist, who focuses on emotional eating and  creating positive behavorial change.  Was counseled on bariatric pharmacotherapy and how this may be used as an adjunct in their weight management   Rahcel appears to be in the action stage of change and states they are ready to start intensive lifestyle modifications and behavioral modifications.  It was recommended that she follow up in the next 1-2 weeks to review the above steps, and to continue with treatment of their chronic disease state of obesity   FOR OTHER CONDITIONS RELATED TO THE DISEASE OF OBESITY:  Primary hypertension Assessment & Plan: Last 3 blood pressure readings in our office are as follows: BP Readings from Last 3 Encounters:  04/17/23 136/80  04/16/23 132/83  03/28/23 138/78   Condition managed by Dr.[place] of cardiology. HTN treated with Spironolactone 50 mg daily. Her BP is stable - no concerns in this regard. Continue medication regimen. A low sodium diet and losing 10% or more of body weight may further improve condition. We will check renal panel and assess cardiovascular risk at the next office visit.  Dyslipidemia Assessment & Plan: Reviewed prior CT on 01/2021  which revealed coronary calcification and a calcium score that was in the 82nd percentile. Also reviewed CT scan done through pulmonary on 01/17/23 which showed plaque in her coronary arteries and aorta. Pt was recently started on Rosuvastatin 10 mg daily per cardiology.   Continue statin therapy. Reassured pt that despite the findings in the CT, the care she is receiving through cardiology as well as if she were to join our clinic can help minimize her ASCVD risk. Losing 10% or more of body weight may improve condition.    Attestations:   Reviewed by clinician on day of visit: allergies, medications, problem list, medical history, surgical history, family history, social history, and previous encounter notes pertinent to obesity diagnosis.  60 minutes was spent today on this visit including  the above counseling, pre-visit chart review, and post-visit documentation.  Over 50% of this time was spent in direct, face-to-face counseling and coordination of care  I, Special Randolm Idol , acting as a medical scribe for Thomasene Lot, DO., have compiled all relevant documentation for today's office visit on behalf of Thomasene Lot, DO, while in the presence of [employer], DO.  I have reviewed the above documentation for accuracy and completeness, and I agree with the above. Marilyn [name], D.O.  The 21st Century Cures Act was signed into law in 2016 which includes the topic of electronic health records.  This provides immediate access to information in MyChart.  This includes consultation notes, operative notes, office notes, lab results and pathology reports.  If you have any questions about what you read please let us know at your next visit so we can discuss your concerns and take corrective action if need be.  We are right here with you!

## 2023-04-18 ENCOUNTER — Encounter (INDEPENDENT_AMBULATORY_CARE_PROVIDER_SITE_OTHER): Payer: Self-pay | Admitting: Adult Health

## 2023-04-25 ENCOUNTER — Other Ambulatory Visit: Payer: Self-pay | Admitting: Family

## 2023-04-25 DIAGNOSIS — I10 Essential (primary) hypertension: Secondary | ICD-10-CM

## 2023-04-25 MED ORDER — SPIRONOLACTONE 50 MG PO TABS: 50.0000 mg | ORAL_TABLET | Freq: Every day | ORAL | Status: DC

## 2023-04-25 NOTE — Telephone Encounter (Signed)
*  STAT* If patient is at the pharmacy, call can be transferred to refill team.   1. Which medications need to be refilled? (please list name of each medication and dose if known)  spironolactone (ALDACTONE) 50 MG tablet  2. Which pharmacy/location (including street and city if local pharmacy) is medication to be sent to? [id number] - [place], [place] - [address]    3. Do they need a 30 day or 90 day supply? 90

## 2023-04-25 NOTE — Telephone Encounter (Signed)
 1/24 Spironolactone increased to 50 mg, refilled as requested

## 2023-05-01 ENCOUNTER — Other Ambulatory Visit (HOSPITAL_BASED_OUTPATIENT_CLINIC_OR_DEPARTMENT_OTHER): Payer: Self-pay

## 2023-05-01 DIAGNOSIS — I10 Essential (primary) hypertension: Secondary | ICD-10-CM

## 2023-05-01 MED ORDER — SPIRONOLACTONE 50 MG PO TABS: 50.0000 mg | ORAL_TABLET | Freq: Every day | ORAL | 3 refills | Status: AC

## 2023-05-01 NOTE — Progress Notes (Signed)
Rx refill sent to preferred pharmacy. 

## 2023-05-16 ENCOUNTER — Ambulatory Visit (INDEPENDENT_AMBULATORY_CARE_PROVIDER_SITE_OTHER): Payer: Medicare Other | Admitting: Family Medicine

## 2023-05-25 LAB — LIPID PANEL
Chol/HDL Ratio: 2 ratio (ref 0.0–4.4)
Cholesterol, Total: 155 mg/dL (ref 100–199)
HDL: 76 mg/dL (ref >39)
LDL Chol Calc (NIH): 67 mg/dL (ref 0–99)
Triglycerides: 56 mg/dL (ref 0–149)
VLDL Cholesterol Cal: 12 mg/dL (ref 5–40)

## 2023-05-25 LAB — COMPREHENSIVE METABOLIC PANEL WITH GFR
ALT: 17 IU/L (ref 0–32)
AST: 16 IU/L (ref 0–40)
Albumin: 4.3 g/dL (ref 3.9–4.9)
Alkaline Phosphatase: 98 IU/L (ref 44–121)
BUN/Creatinine Ratio: 18 (ref 12–28)
BUN: 14 mg/dL (ref 8–27)
Bilirubin Total: 0.3 mg/dL (ref 0.0–1.2)
CO2: 21 mmol/L (ref 20–29)
Calcium: 10 mg/dL (ref 8.7–10.3)
Chloride: 103 mmol/L (ref 96–106)
Creatinine, Ser: 0.78 mg/dL (ref 0.57–1.00)
Globulin, Total: 2.8 g/dL (ref 1.5–4.5)
Glucose: 98 mg/dL (ref 70–99)
Potassium: 4.6 mmol/L (ref 3.5–5.2)
Sodium: 139 mmol/L (ref 134–144)
Total Protein: 7.1 g/dL (ref 6.0–8.5)
eGFR: 82 mL/min/{1.73_m2} (ref >59)

## 2023-05-30 ENCOUNTER — Ambulatory Visit (INDEPENDENT_AMBULATORY_CARE_PROVIDER_SITE_OTHER): Payer: Medicare Other | Admitting: Family Medicine

## 2023-06-21 ENCOUNTER — Encounter: Payer: Self-pay | Admitting: Internal Medicine

## 2023-06-21 ENCOUNTER — Encounter (HOSPITAL_COMMUNITY): Payer: Self-pay

## 2023-06-21 ENCOUNTER — Encounter: Payer: Self-pay | Admitting: Pulmonary Disease

## 2023-06-21 ENCOUNTER — Encounter (HOSPITAL_BASED_OUTPATIENT_CLINIC_OR_DEPARTMENT_OTHER): Payer: Self-pay | Admitting: Cardiovascular Disease

## 2023-06-21 NOTE — Progress Notes (Signed)
 Surgical Instructions   Your procedure is scheduled on Monday Jul 02, 2023. Report to [facility] Main Entrance "A" at 6:10 A.M., then check in with the Admitting office. Any questions or running late day of surgery: call [phone]  Questions prior to your surgery date: call [phone], Monday-Friday, 8am-4pm. If you experience any cold or flu symptoms such as cough, fever, chills, shortness of breath, etc. between now and your scheduled surgery, please notify us  at the above number.     Remember:  Do not eat after midnight the night before your surgery  You may drink clear liquids until 5:40 the morning of your surgery.   Clear liquids allowed are: Water, Non-Citrus Juices (without pulp), Carbonated Beverages, Clear Tea (no milk, honey, etc.), Black Coffee Only (NO MILK, CREAM OR POWDERED CREAMER of any kind), and Gatorade.  Patient Instructions  The night before surgery:  No food after midnight. ONLY clear liquids after midnight  The day of surgery (if you do NOT have diabetes):  Drink ONE (1) Pre-Surgery Clear Ensure by 5:40 the morning of surgery. Drink in one sitting. Do not sip.  This drink was given to you during your hospital  pre-op appointment visit.  Nothing else to drink after completing the  Pre-Surgery Clear Ensure.         If you have questions, please contact your surgeon's office.    Take these medicines the morning of surgery with A SIP OF WATER    May take these medicines IF NEEDED: albuterol  (VENTOLIN  HFA) 108 (90 Base) MCG/ACT inhaler. Please bring with you to the hospital.     One week prior to surgery, STOP taking any Aspirin (unless otherwise instructed by your surgeon) Aleve, Naproxen, Ibuprofen , Motrin , Advil , Goody's, BC's, all herbal medications, fish oil, and non-prescription vitamins.                     Do NOT Smoke (Tobacco/Vaping) for 24 hours prior to your procedure.  If you use a CPAP at night, you may bring your mask/headgear for  your overnight stay.   You will be asked to remove any contacts, glasses, piercing's, hearing aid's, dentures/partials prior to surgery. Please bring cases for these items if needed.    Patients discharged the day of surgery will not be allowed to drive home, and someone needs to stay with them for 24 hours.  SURGICAL WAITING ROOM VISITATION Patients may have no more than 2 support people in the waiting area - these visitors may rotate.   Pre-op nurse will coordinate an appropriate time for 1 ADULT support person, who may not rotate, to accompany patient in pre-op.  Children under the age of 49 must have an adult with them who is not the patient and must remain in the main waiting area with an adult.  If the patient needs to stay at the hospital during part of their recovery, the visitor guidelines for inpatient rooms apply.  Please refer to the [facility] website for the visitor guidelines for any additional information.   If you received a COVID test during your pre-op visit  it is requested that you wear a mask when out in public, stay away from anyone that may not be feeling well and notify your surgeon if you develop symptoms. If you have been in contact with anyone that has tested positive in the last 10 days please notify you surgeon.      Pre-operative 5 CHG Bathing Instructions   You can  play a key role in reducing the risk of infection after surgery. Your skin needs to be as free of germs as possible. You can reduce the number of germs on your skin by washing with CHG (chlorhexidine gluconate) soap before surgery. CHG is an antiseptic soap that kills germs and continues to kill germs even after washing.   DO NOT use if you have an allergy to chlorhexidine/CHG or antibacterial soaps. If your skin becomes reddened or irritated, stop using the CHG and notify one of our RNs at [phone].   Please shower with the CHG soap starting 4 days before surgery using the following  schedule:     Please keep in mind the following:  DO NOT shave, including legs and underarms, starting the day of your first shower.   You may shave your face at any point before/day of surgery.  Place clean sheets on your bed the day you start using CHG soap. Use a clean washcloth (not used since being washed) for each shower. DO NOT sleep with pets once you start using the CHG.   CHG Shower Instructions:  Wash your face and private area with normal soap. If you choose to wash your hair, wash first with your normal shampoo.  After you use shampoo/soap, rinse your hair and body thoroughly to remove shampoo/soap residue.  Turn the water OFF and apply about 3 tablespoons (45 ml) of CHG soap to a CLEAN washcloth.  Apply CHG soap ONLY FROM YOUR NECK DOWN TO YOUR TOES (washing for 3-5 minutes)  DO NOT use CHG soap on face, private areas, open wounds, or sores.  Pay special attention to the area where your surgery is being performed.  If you are having back surgery, having someone wash your back for you may be helpful. Wait 2 minutes after CHG soap is applied, then you may rinse off the CHG soap.  Pat dry with a clean towel  Put on clean clothes/pajamas   If you choose to wear lotion, please use ONLY the CHG-compatible lotions that are listed below.  Additional instructions for the day of surgery: DO NOT APPLY any lotions, deodorants, cologne, or perfumes.   Do not bring valuables to the hospital. [facility] is not responsible for any belongings/valuables. Do not wear nail polish, gel polish, artificial nails, or any other type of covering on natural nails (fingers and toes) Do not wear jewelry or makeup Put on clean/comfortable clothes.  Please brush your teeth.  Ask your nurse before applying any prescription medications to the skin.     CHG Compatible Lotions   Aveeno Moisturizing lotion  Cetaphil Moisturizing Cream  Cetaphil Moisturizing Lotion  Clairol Herbal Essence  Moisturizing Lotion, Dry Skin  Clairol Herbal Essence Moisturizing Lotion, Extra Dry Skin  Clairol Herbal Essence Moisturizing Lotion, Normal Skin  Curel Age Defying Therapeutic Moisturizing Lotion with Alpha Hydroxy  Curel Extreme Care Body Lotion  Curel Soothing Hands Moisturizing Hand Lotion  Curel Therapeutic Moisturizing Cream, Fragrance-Free  Curel Therapeutic Moisturizing Lotion, Fragrance-Free  Curel Therapeutic Moisturizing Lotion, Original Formula  Eucerin Daily Replenishing Lotion  Eucerin Dry Skin Therapy Plus Alpha Hydroxy Crme  Eucerin Dry Skin Therapy Plus Alpha Hydroxy Lotion  Eucerin Original Crme  Eucerin Original Lotion  Eucerin Plus Crme Eucerin Plus Lotion  Eucerin TriLipid Replenishing Lotion  Keri Anti-Bacterial Hand Lotion  Keri Deep Conditioning Original Lotion Dry Skin Formula Softly Scented  Keri Deep Conditioning Original Lotion, Fragrance Free Sensitive Skin Formula  Keri Lotion Fast Absorbing Fragrance Free  Sensitive Skin Formula  Keri Lotion Fast Absorbing Softly Scented Dry Skin Formula  Keri Original Lotion  Keri Skin Renewal Lotion Keri Silky Smooth Lotion  Keri Silky Smooth Sensitive Skin Lotion  Nivea Body Creamy Conditioning Oil  Nivea Body Extra Enriched Lotion  Nivea Body Original Lotion  Nivea Body Sheer Moisturizing Lotion Nivea Crme  Nivea Skin Firming Lotion  NutraDerm 30 Skin Lotion  NutraDerm Skin Lotion  NutraDerm Therapeutic Skin Cream  NutraDerm Therapeutic Skin Lotion  ProShield Protective Hand Cream  Provon moisturizing lotion  Please read over the following fact sheets that you were given.

## 2023-06-21 NOTE — Progress Notes (Signed)
 Surgical Instructions   Your procedure is scheduled on Monday Jul 02, 2023. Report to [facility] Main Entrance "A" at 6:10 A.M., then check in with the Admitting office. Any questions or running late day of surgery: call [phone]  Questions prior to your surgery date: call [phone], Monday-Friday, 8am-4pm. If you experience any cold or flu symptoms such as cough, fever, chills, shortness of breath, etc. between now and your scheduled surgery, please notify us  at the above number.     Remember:  Do not eat after midnight the night before your surgery  You may drink clear liquids until 5:40 the morning of your surgery.   Clear liquids allowed are: Water, Non-Citrus Juices (without pulp), Carbonated Beverages, Clear Tea (no milk, honey, etc.), Black Coffee Only (NO MILK, CREAM OR POWDERED CREAMER of any kind), and Gatorade.  Patient Instructions  The night before surgery:  No food after midnight. ONLY clear liquids after midnight  The day of surgery (if you do NOT have diabetes):  Drink ONE (1) Pre-Surgery Clear Ensure by 5:40 the morning of surgery. Drink in one sitting. Do not sip.  This drink was given to you during your hospital  pre-op appointment visit.  Nothing else to drink after completing the  Pre-Surgery Clear Ensure.         If you have questions, please contact your surgeon's office.    Take these medicines the morning of surgery with A SIP OF WATER  budesonide -formoterol  (SYMBICORT ) 80-4.5 MCG/ACT inhaler  busPIRone  (BUSPAR )  rosuvastatin  (CRESTOR )   May take these medicines IF NEEDED: albuterol  (VENTOLIN  HFA) 108 (90 Base) MCG/ACT inhaler. Please bring with you to the hospital.   ALPRAZolam  (XANAX )   One week prior to surgery, STOP taking any Aspirin (unless otherwise instructed by your surgeon) Aleve, Naproxen, Ibuprofen , Motrin , Advil , Goody's, BC's, all herbal medications, fish oil, and non-prescription vitamins.  This includes your diclofenac  Sodium  (VOLTAREN ) 1 % GEL.                       Do NOT Smoke (Tobacco/Vaping) for 24 hours prior to your procedure.  If you use a CPAP at night, you may bring your mask/headgear for your overnight stay.   You will be asked to remove any contacts, glasses, piercing's, hearing aid's, dentures/partials prior to surgery. Please bring cases for these items if needed.    Patients discharged the day of surgery will not be allowed to drive home, and someone needs to stay with them for 24 hours.  SURGICAL WAITING ROOM VISITATION Patients may have no more than 2 support people in the waiting area - these visitors may rotate.   Pre-op nurse will coordinate an appropriate time for 1 ADULT support person, who may not rotate, to accompany patient in pre-op.  Children under the age of 69 must have an adult with them who is not the patient and must remain in the main waiting area with an adult.  If the patient needs to stay at the hospital during part of their recovery, the visitor guidelines for inpatient rooms apply.  Please refer to the [facility] website for the visitor guidelines for any additional information.   If you received a COVID test during your pre-op visit  it is requested that you wear a mask when out in public, stay away from anyone that may not be feeling well and notify your surgeon if you develop symptoms. If you have been in contact with anyone that has  tested positive in the last 10 days please notify you surgeon.      Pre-operative 5 CHG Bathing Instructions   You can play a key role in reducing the risk of infection after surgery. Your skin needs to be as free of germs as possible. You can reduce the number of germs on your skin by washing with CHG (chlorhexidine gluconate) soap before surgery. CHG is an antiseptic soap that kills germs and continues to kill germs even after washing.   DO NOT use if you have an allergy to chlorhexidine/CHG or antibacterial soaps. If your skin becomes  reddened or irritated, stop using the CHG and notify one of our RNs at [phone].   Please shower with the CHG soap starting 4 days before surgery using the following schedule:     Please keep in mind the following:  DO NOT shave, including legs and underarms, starting the day of your first shower.   You may shave your face at any point before/day of surgery.  Place clean sheets on your bed the day you start using CHG soap. Use a clean washcloth (not used since being washed) for each shower. DO NOT sleep with pets once you start using the CHG.   CHG Shower Instructions:  Wash your face and private area with normal soap. If you choose to wash your hair, wash first with your normal shampoo.  After you use shampoo/soap, rinse your hair and body thoroughly to remove shampoo/soap residue.  Turn the water OFF and apply about 3 tablespoons (45 ml) of CHG soap to a CLEAN washcloth.  Apply CHG soap ONLY FROM YOUR NECK DOWN TO YOUR TOES (washing for 3-5 minutes)  DO NOT use CHG soap on face, private areas, open wounds, or sores.  Pay special attention to the area where your surgery is being performed.  If you are having back surgery, having someone wash your back for you may be helpful. Wait 2 minutes after CHG soap is applied, then you may rinse off the CHG soap.  Pat dry with a clean towel  Put on clean clothes/pajamas   If you choose to wear lotion, please use ONLY the CHG-compatible lotions that are listed below.  Additional instructions for the day of surgery: DO NOT APPLY any lotions, deodorants, cologne, or perfumes.   Do not bring valuables to the hospital. [facility] is not responsible for any belongings/valuables. Do not wear nail polish, gel polish, artificial nails, or any other type of covering on natural nails (fingers and toes) Do not wear jewelry or makeup Put on clean/comfortable clothes.  Please brush your teeth.  Ask your nurse before applying any prescription  medications to the skin.     CHG Compatible Lotions   Aveeno Moisturizing lotion  Cetaphil Moisturizing Cream  Cetaphil Moisturizing Lotion  Clairol Herbal Essence Moisturizing Lotion, Dry Skin  Clairol Herbal Essence Moisturizing Lotion, Extra Dry Skin  Clairol Herbal Essence Moisturizing Lotion, Normal Skin  Curel Age Defying Therapeutic Moisturizing Lotion with Alpha Hydroxy  Curel Extreme Care Body Lotion  Curel Soothing Hands Moisturizing Hand Lotion  Curel Therapeutic Moisturizing Cream, Fragrance-Free  Curel Therapeutic Moisturizing Lotion, Fragrance-Free  Curel Therapeutic Moisturizing Lotion, Original Formula  Eucerin Daily Replenishing Lotion  Eucerin Dry Skin Therapy Plus Alpha Hydroxy Crme  Eucerin Dry Skin Therapy Plus Alpha Hydroxy Lotion  Eucerin Original Crme  Eucerin Original Lotion  Eucerin Plus Crme Eucerin Plus Lotion  Eucerin TriLipid Replenishing Lotion  Keri Anti-Bacterial Hand Lotion  Keri Deep Conditioning  Original Lotion Dry Skin Formula Softly Scented  Keri Deep Conditioning Original Lotion, Fragrance Free Sensitive Skin Formula  Keri Lotion Fast Absorbing Fragrance Free Sensitive Skin Formula  Keri Lotion Fast Absorbing Softly Scented Dry Skin Formula  Keri Original Lotion  Keri Skin Renewal Lotion Keri Silky Smooth Lotion  Keri Silky Smooth Sensitive Skin Lotion  Nivea Body Creamy Conditioning Oil  Nivea Body Extra Enriched Lotion  Nivea Body Original Lotion  Nivea Body Sheer Moisturizing Lotion Nivea Crme  Nivea Skin Firming Lotion  NutraDerm 30 Skin Lotion  NutraDerm Skin Lotion  NutraDerm Therapeutic Skin Cream  NutraDerm Therapeutic Skin Lotion  ProShield Protective Hand Cream  Provon moisturizing lotion  Please read over the following fact sheets that you were given.

## 2023-06-22 ENCOUNTER — Encounter (HOSPITAL_COMMUNITY): Payer: Self-pay

## 2023-06-22 ENCOUNTER — Encounter (HOSPITAL_COMMUNITY)
Admission: RE | Admit: 2023-06-22 | Discharge: 2023-06-22 | Disposition: A | Discharge status: Home / Self Care | Source: Ambulatory Visit | Attending: Orthopaedic Surgery | Admitting: Orthopaedic Surgery

## 2023-06-22 ENCOUNTER — Other Ambulatory Visit: Payer: Self-pay

## 2023-06-22 VITALS — BP 152/73 | HR 82 | Temp 98.2°F | Resp 18 | Ht 65.0 in | Wt 229.4 lb

## 2023-06-22 DIAGNOSIS — Z87891 Personal history of nicotine dependence: Secondary | ICD-10-CM | POA: Diagnosis not present

## 2023-06-22 DIAGNOSIS — Z923 Personal history of irradiation: Secondary | ICD-10-CM | POA: Diagnosis not present

## 2023-06-22 DIAGNOSIS — Z8709 Personal history of other diseases of the respiratory system: Secondary | ICD-10-CM | POA: Insufficient documentation

## 2023-06-22 DIAGNOSIS — Z01812 Encounter for preprocedural laboratory examination: Secondary | ICD-10-CM | POA: Diagnosis present

## 2023-06-22 DIAGNOSIS — Z01818 Encounter for other preprocedural examination: Secondary | ICD-10-CM

## 2023-06-22 DIAGNOSIS — G4733 Obstructive sleep apnea (adult) (pediatric): Secondary | ICD-10-CM | POA: Insufficient documentation

## 2023-06-22 DIAGNOSIS — M1711 Unilateral primary osteoarthritis, right knee: Secondary | ICD-10-CM | POA: Insufficient documentation

## 2023-06-22 DIAGNOSIS — I2510 Atherosclerotic heart disease of native coronary artery without angina pectoris: Secondary | ICD-10-CM | POA: Insufficient documentation

## 2023-06-22 DIAGNOSIS — I10 Essential (primary) hypertension: Secondary | ICD-10-CM | POA: Insufficient documentation

## 2023-06-22 DIAGNOSIS — Z853 Personal history of malignant neoplasm of breast: Secondary | ICD-10-CM | POA: Diagnosis not present

## 2023-06-22 DIAGNOSIS — Z79899 Other long term (current) drug therapy: Secondary | ICD-10-CM | POA: Insufficient documentation

## 2023-06-22 HISTORY — DX: Prediabetes: R73.03

## 2023-06-22 HISTORY — DX: Essential (primary) hypertension: I10

## 2023-06-22 LAB — CBC
HCT: 40.3 % (ref 36.0–46.0)
Hemoglobin: 12.5 g/dL (ref 12.0–15.0)
MCH: 23.5 pg — ABNORMAL LOW (ref 26.0–34.0)
MCHC: 31 g/dL (ref 30.0–36.0)
MCV: 75.8 fL — ABNORMAL LOW (ref 80.0–100.0)
Platelets: 254 10*3/uL (ref 150–400)
RBC: 5.32 MIL/uL — ABNORMAL HIGH (ref 3.87–5.11)
RDW: 14.8 % (ref 11.5–15.5)
WBC: 10.2 10*3/uL (ref 4.0–10.5)
nRBC: 0 % (ref 0.0–0.2)

## 2023-06-22 LAB — BASIC METABOLIC PANEL WITH GFR
Anion gap: 7 (ref 5–15)
BUN: 9 mg/dL (ref 8–23)
CO2: 25 mmol/L (ref 22–32)
Calcium: 9.8 mg/dL (ref 8.9–10.3)
Chloride: 107 mmol/L (ref 98–111)
Creatinine, Ser: 0.67 mg/dL (ref 0.44–1.00)
GFR, Estimated: >60 mL/min (ref >60)
Glucose, Bld: 89 mg/dL (ref 70–99)
Potassium: 4.2 mmol/L (ref 3.5–5.1)
Sodium: 139 mmol/L (ref 135–145)

## 2023-06-22 LAB — SURGICAL PCR SCREEN
MRSA, PCR: NEGATIVE
Staphylococcus aureus: NEGATIVE

## 2023-06-22 NOTE — Progress Notes (Signed)
 PCP - Dr. Adelaide Holy Cardiologist - Dr. Binnie Buffalo  PPM/ICD - Denies Device Orders - n/a Rep Notified - n/a  Chest x-ray - n/a EKG - 03-15-23 Stress Test - denies ECHO - 03-30-21 Cardiac Cath - denies  Sleep Study - denies CPAP - n/a  Non-diabetic  Last dose of GLP1 agonist-  Denies GLP1 instructions: n/a  Blood Thinner Instructions: denies Aspirin Instructions:denies  ERAS Protcol - clears until 5:40 PRE-SURGERY Ensure or G2- ensure   COVID TEST- n/a   Anesthesia review: Yes, HTN, obesity, sarcoidosis   Patient denies shortness of breath, fever, cough and chest pain at PAT appointment. Patient denies any respiratory issues at this time.    All instructions explained to the patient, with a verbal understanding of the material. Patient agrees to go over the instructions while at home for a better understanding. Patient also instructed to self quarantine after being tested for COVID-19. The opportunity to ask questions was provided.

## 2023-06-26 ENCOUNTER — Telehealth: Payer: Self-pay | Admitting: Orthopaedic Surgery

## 2023-06-26 NOTE — Progress Notes (Addendum)
 Anesthesia Chart Review:  68 year old female follows with pulmonology for history of sarcoidosis.  She is also a former smoker, quit 2022.  Last seen by Dr. Gaynell Keeler on 03/28/2023.  Per note, relatively stable, no recent exacerbations, only using albuterol  as needed, no significant exacerbations recently.  PFTs with significant bronchodilator response.  Follows with cardiologist Dr. Theodis Fiscal for history of HTN, OSA on CPAP, coronary calcifications.  Echo 03/2021 showed EF 60 to 65%, grade 1 DD.  Last seen by Dr. Theodis Fiscal on 03/15/2023, at that time her amlodipine  was discontinued due to pedal edema and she was started on spironolactone  25 mg daily.  She was also started on rosuvastatin  10 mg daily.  History of left breast cancer s/p lumpectomy and radiation.  Medical clearance from PCP. Dr. Georgia Kipper dated 05/07/23 states pt low risk.  Preop labs reviewed, unremarkable.  EKG 03/15/2023: NSR.  Rate 77.  PFTs 03/28/2023: FVC-%Pred-Pre % 80  FEV1-%Pred-Pre % 66  FEV1FVC-%Pred-Pre % 83  TLC % pred % 105  RV % pred % 126  DLCO unc % pred % 69    Echo 03/30/2021:  1. Left ventricular ejection fraction, by estimation, is 60 to 65%. The  left ventricle has normal function. The left ventricle has no regional  wall motion abnormalities. There is mild left ventricular hypertrophy.  Left ventricular diastolic parameters  are consistent with Grade I diastolic dysfunction (impaired relaxation).   2. Right ventricular systolic function is normal. The right ventricular  size is normal.   3. The mitral valve is normal in structure. No evidence of mitral valve  regurgitation. No evidence of mitral stenosis.   4. The aortic valve is normal in structure. Aortic valve regurgitation is  not visualized. No aortic stenosis is present.   5. The inferior vena cava is normal in size with greater than 50%  respiratory variability, suggesting right atrial pressure of 3 mmHg.   CT Calcium  Score  01/28/2021: FINDINGS: Coronary arteries: Normal origins.   Coronary Calcium  Score:   Left main: 26   Left anterior descending artery: 3.26   Left circumflex artery: 0   Right coronary artery: 50.4   Total: 79.6   Percentile: 82nd   Pericardium: Normal.   Ascending Aorta: Normal caliber. Mild calcification of the aortic root.   Non-cardiac: See separate report from [facility].   IMPRESSION: Coronary calcium  score of 79.6. This was 82nd percentile for age-, race-, and sex-matched controls.   Mild calcification of the aortic root.    Edilia Gordon [facility] Short Stay Center/Anesthesiology Phone [phone] 06/26/2023 3:24 PM

## 2023-06-26 NOTE — Telephone Encounter (Signed)
 Patient called and said that her cardiologist said she needs a performing provider preop request. [phone] You can fax it to [phone]

## 2023-06-26 NOTE — Anesthesia Preprocedure Evaluation (Addendum)
 Anesthesia Evaluation  Patient identified by MRN, date of birth, ID band Patient awake    Reviewed: Allergy & Precautions, NPO status , Patient's Chart, lab work & pertinent test results  History of Anesthesia Complications (+) PROLONGED EMERGENCE and history of anesthetic complications  Airway Mallampati: III  TM Distance: >3 FB     Dental  (+) Dental Advisory Given Invisalign clips:   Pulmonary asthma , sleep apnea , COPD (used Symbicort  this morning. used albuterol  06/29/23),  COPD inhaler, neg recent URI, former smoker H/o Sarcoidosis    Pulmonary exam normal breath sounds clear to auscultation       Cardiovascular hypertension, Pt. on medications (-) angina + CAD  (-) Past MI, (-) Cardiac Stents and (-) CABG (-) dysrhythmias  Rhythm:Regular Rate:Normal  HLD  TTE 03/30/2021: IMPRESSIONS    1. Left ventricular ejection fraction, by estimation, is 60 to 65%. The  left ventricle has normal function. The left ventricle has no regional  wall motion abnormalities. There is mild left ventricular hypertrophy.  Left ventricular diastolic parameters  are consistent with Grade I diastolic dysfunction (impaired relaxation).   2. Right ventricular systolic function is normal. The right ventricular  size is normal.   3. The mitral valve is normal in structure. No evidence of mitral valve  regurgitation. No evidence of mitral stenosis.   4. The aortic valve is normal in structure. Aortic valve regurgitation is  not visualized. No aortic stenosis is present.   5. The inferior vena cava is normal in size with greater than 50%  respiratory variability, suggesting right atrial pressure of 3 mmHg.     Neuro/Psych  Headaches, neg Seizures PSYCHIATRIC DISORDERS (panic disorder) Anxiety Depression       GI/Hepatic Neg liver ROS,GERD  ,,Diverticulosis    Endo/Other  Pre-diabetes  Renal/GU negative Renal ROS     Musculoskeletal  (+)  Arthritis , Osteoarthritis,    Abdominal  (+) + obese  Peds  Hematology negative hematology ROS (+) Lab Results      Component                Value               Date                      WBC                      10.2                06/22/2023                HGB                      12.5                06/22/2023                HCT                      40.3                06/22/2023                MCV                      75.8 (L)            06/22/2023  PLT                      254                 06/22/2023              Anesthesia Other Findings   Reproductive/Obstetrics H/o left breast cancer                             Anesthesia Physical Anesthesia Plan  ASA: 3  Anesthesia Plan: MAC and Spinal   Post-op Pain Management: Regional block* and Tylenol  PO (pre-op)*   Induction: Intravenous  PONV Risk Score and Plan: 2 and Ondansetron , Dexamethasone and Treatment may vary due to age or medical condition  Airway Management Planned: Natural Airway and Simple Face Mask  Additional Equipment:   Intra-op Plan:   Post-operative Plan: Extubation in OR  Informed Consent: I have reviewed the patients History and Physical, chart, labs and discussed the procedure including the risks, benefits and alternatives for the proposed anesthesia with the patient or authorized representative who has indicated his/her understanding and acceptance.     Dental advisory given  Plan Discussed with: CRNA and Anesthesiologist  Anesthesia Plan Comments: (Discussed potential risks of nerve blocks including, but not limited to, infection, bleeding, nerve damage, seizures, pneumothorax, respiratory depression, and potential failure of the block. Alternatives to nerve blocks discussed. All questions answered.  I have discussed risks of neuraxial anesthesia including but not limited to infection, bleeding, nerve injury, back pain, headache, seizures, and failure of  block. Patient denies bleeding disorders and is not currently anticoagulated. Labs have been reviewed. Risks and benefits discussed. All patient's questions answered.   Discussed with patient risks of MAC including, but not limited to, minor pain or discomfort, hearing people in the room, and possible need for backup general anesthesia. Risks for general anesthesia also discussed including, but not limited to, sore throat, hoarse voice, chipped/damaged teeth, injury to vocal cords, nausea and vomiting, allergic reactions, lung infection, heart attack, stroke, and death. All questions answered.   PAT note by Rudy Costain, PA-C: 68 year old female follows with pulmonology for history of sarcoidosis.  She is also a former smoker, quit 2022.  Last seen by Dr. Gaynell Keeler on 03/28/2023.  Per note, relatively stable, no recent exacerbations, only using albuterol  as needed, no significant exacerbations recently.  PFTs with significant bronchodilator response.  Follows with cardiologist Dr. Theodis Fiscal for history of HTN, OSA on CPAP, coronary calcifications.  Echo 03/2021 showed EF 60 to 65%, grade 1 DD.  Last seen by Dr. Theodis Fiscal on 03/15/2023, at that time her amlodipine  was discontinued due to pedal edema and she was started on spironolactone  25 mg daily.  She was also started on rosuvastatin  10 mg daily.  History of left breast cancer s/p lumpectomy and radiation.  Medical clearance from PCP. Dr. Georgia Kipper dated 05/07/23 states pt low risk.  Preop labs reviewed, unremarkable.  EKG 03/15/2023: NSR.  Rate 77.  PFTs 03/28/2023: FVC-%Pred-Pre % 80 FEV1-%Pred-Pre % 66 FEV1FVC-%Pred-Pre % 83 TLC % pred % 105 RV % pred % 126 DLCO unc % pred % 69   Echo 03/30/2021: 1. Left ventricular ejection fraction, by estimation, is 60 to 65%. The  left ventricle has normal function. The left ventricle has no regional  wall motion abnormalities. There is mild left ventricular hypertrophy.  Left ventricular diastolic  parameters  are consistent with Grade I  diastolic dysfunction (impaired relaxation).  2. Right ventricular systolic function is normal. The right ventricular  size is normal.  3. The mitral valve is normal in structure. No evidence of mitral valve  regurgitation. No evidence of mitral stenosis.  4. The aortic valve is normal in structure. Aortic valve regurgitation is  not visualized. No aortic stenosis is present.  5. The inferior vena cava is normal in size with greater than 50%  respiratory variability, suggesting right atrial pressure of 3 mmHg.  CT Calcium  Score 01/28/2021: FINDINGS: Coronary arteries: Normal origins.  Coronary Calcium  Score:  Left main: 26  Left anterior descending artery: 3.26  Left circumflex artery: 0  Right coronary artery: 50.4  Total: 79.6  Percentile: 82nd  Pericardium: Normal.  Ascending Aorta: Normal caliber. Mild calcification of the aortic root.  Non-cardiac: See separate report from [facility].  IMPRESSION: Coronary calcium  score of 79.6. This was 82nd percentile for age-, race-, and sex-matched controls.  Mild calcification of the aortic root. )        Anesthesia Quick Evaluation

## 2023-06-27 ENCOUNTER — Other Ambulatory Visit: Payer: Self-pay | Admitting: Physician Assistant

## 2023-06-27 MED ORDER — DOCUSATE SODIUM 100 MG PO CAPS: 100.0000 mg | ORAL_CAPSULE | Freq: Every day | ORAL | 2 refills | Status: DC | PRN

## 2023-06-27 MED ORDER — METHOCARBAMOL 750 MG PO TABS: 750.0000 mg | ORAL_TABLET | Freq: Three times a day (TID) | ORAL | 2 refills | Status: DC | PRN

## 2023-06-27 MED ORDER — ONDANSETRON HCL 4 MG PO TABS: 4.0000 mg | ORAL_TABLET | Freq: Three times a day (TID) | ORAL | 0 refills | Status: DC | PRN

## 2023-06-27 MED ORDER — OXYCODONE-ACETAMINOPHEN 5-325 MG PO TABS: 1.0000 | ORAL_TABLET | Freq: Four times a day (QID) | ORAL | 0 refills | Status: DC | PRN

## 2023-06-29 MED ORDER — TRANEXAMIC ACID 1000 MG/10ML IV SOLN
2000.0000 mg | INTRAVENOUS | Status: AC
  Filled 2023-06-29: qty 20

## 2023-07-01 DIAGNOSIS — M1711 Unilateral primary osteoarthritis, right knee: Secondary | ICD-10-CM | POA: Insufficient documentation

## 2023-07-02 ENCOUNTER — Other Ambulatory Visit: Payer: Self-pay

## 2023-07-02 ENCOUNTER — Encounter (HOSPITAL_COMMUNITY): Payer: Self-pay | Admitting: Orthopaedic Surgery

## 2023-07-02 ENCOUNTER — Observation Stay (HOSPITAL_COMMUNITY)
Admission: RE | Admit: 2023-07-02 | Discharge: 2023-07-03 | Disposition: A | Discharge status: Home Health | Attending: Orthopaedic Surgery | Admitting: Orthopaedic Surgery

## 2023-07-02 ENCOUNTER — Ambulatory Visit (HOSPITAL_COMMUNITY): Admitting: Certified Registered"

## 2023-07-02 ENCOUNTER — Other Ambulatory Visit (HOSPITAL_COMMUNITY): Payer: Self-pay

## 2023-07-02 ENCOUNTER — Ambulatory Visit (HOSPITAL_COMMUNITY): Payer: Self-pay | Admitting: Physician Assistant

## 2023-07-02 ENCOUNTER — Observation Stay (HOSPITAL_COMMUNITY)

## 2023-07-02 ENCOUNTER — Encounter (HOSPITAL_COMMUNITY)
Admission: RE | Disposition: A | Discharge status: Home Health | Payer: Self-pay | Source: Home / Self Care | Attending: Orthopaedic Surgery

## 2023-07-02 ENCOUNTER — Other Ambulatory Visit: Payer: Self-pay | Admitting: Physician Assistant

## 2023-07-02 DIAGNOSIS — I10 Essential (primary) hypertension: Secondary | ICD-10-CM

## 2023-07-02 DIAGNOSIS — Z853 Personal history of malignant neoplasm of breast: Secondary | ICD-10-CM | POA: Insufficient documentation

## 2023-07-02 DIAGNOSIS — J449 Chronic obstructive pulmonary disease, unspecified: Secondary | ICD-10-CM | POA: Diagnosis not present

## 2023-07-02 DIAGNOSIS — Z79899 Other long term (current) drug therapy: Secondary | ICD-10-CM | POA: Diagnosis not present

## 2023-07-02 DIAGNOSIS — M1711 Unilateral primary osteoarthritis, right knee: Secondary | ICD-10-CM | POA: Diagnosis present

## 2023-07-02 DIAGNOSIS — Z87891 Personal history of nicotine dependence: Secondary | ICD-10-CM | POA: Insufficient documentation

## 2023-07-02 DIAGNOSIS — Z96651 Presence of right artificial knee joint: Secondary | ICD-10-CM

## 2023-07-02 DIAGNOSIS — I2510 Atherosclerotic heart disease of native coronary artery without angina pectoris: Secondary | ICD-10-CM

## 2023-07-02 HISTORY — PX: TOTAL KNEE ARTHROPLASTY: SHX125

## 2023-07-02 SURGERY — ARTHROPLASTY, KNEE, TOTAL
Anesthesia: Monitor Anesthesia Care | Site: Knee | Laterality: Right

## 2023-07-02 MED ORDER — POVIDONE-IODINE 10 % EX SWAB
2.0000 | Freq: Once | CUTANEOUS | Status: AC
  Administered 2023-07-02: 2 via TOPICAL

## 2023-07-02 MED ORDER — ONDANSETRON HCL 4 MG/2ML IJ SOLN
4.0000 mg | Freq: Four times a day (QID) | INTRAMUSCULAR | Status: DC | PRN
  Administered 2023-07-02: 4 mg via INTRAVENOUS
  Filled 2023-07-02: qty 2

## 2023-07-02 MED ORDER — BUPIVACAINE IN DEXTROSE 0.75-8.25 % IT SOLN
INTRATHECAL | Status: DC | PRN
Start: 2023-07-02 — End: 2023-07-02
  Administered 2023-07-02: 1.8 mL via INTRATHECAL

## 2023-07-02 MED ORDER — SODIUM CHLORIDE 0.9 % IR SOLN
Status: DC | PRN
  Administered 2023-07-02: 3000 mL

## 2023-07-02 MED ORDER — ONDANSETRON HCL 4 MG/2ML IJ SOLN
INTRAMUSCULAR | Status: DC | PRN
Start: 2023-07-02 — End: 2023-07-02
  Administered 2023-07-02: 4 mg via INTRAVENOUS

## 2023-07-02 MED ORDER — APIXABAN 2.5 MG PO TABS
2.5000 mg | ORAL_TABLET | Freq: Two times a day (BID) | ORAL | 0 refills | Status: DC
  Filled 2023-07-02: qty 60, 30d supply, fill #0

## 2023-07-02 MED ORDER — AMISULPRIDE (ANTIEMETIC) 5 MG/2ML IV SOLN: 10.0000 mg | Freq: Once | INTRAVENOUS | Status: DC | PRN

## 2023-07-02 MED ORDER — CEFAZOLIN SODIUM-DEXTROSE 2-4 GM/100ML-% IV SOLN
2.0000 g | INTRAVENOUS | Status: AC
  Administered 2023-07-02: 2 g via INTRAVENOUS
  Filled 2023-07-02: qty 100

## 2023-07-02 MED ORDER — DEXAMETHASONE SODIUM PHOSPHATE 10 MG/ML IJ SOLN
INTRAMUSCULAR | Status: AC
  Filled 2023-07-02: qty 1

## 2023-07-02 MED ORDER — PROPOFOL 1000 MG/100ML IV EMUL
INTRAVENOUS | Status: AC
  Filled 2023-07-02: qty 100

## 2023-07-02 MED ORDER — TRANEXAMIC ACID 1000 MG/10ML IV SOLN
INTRAVENOUS | Status: DC | PRN
  Administered 2023-07-02: 2000 mg via TOPICAL

## 2023-07-02 MED ORDER — BUPIVACAINE-MELOXICAM ER 400-12 MG/14ML IJ SOLN
INTRAMUSCULAR | Status: DC | PRN
  Administered 2023-07-02: 400 mg

## 2023-07-02 MED ORDER — TRANEXAMIC ACID-NACL 1000-0.7 MG/100ML-% IV SOLN
1000.0000 mg | Freq: Once | INTRAVENOUS | Status: AC
  Administered 2023-07-02: 1000 mg via INTRAVENOUS
  Filled 2023-07-02: qty 100

## 2023-07-02 MED ORDER — MIDAZOLAM HCL 2 MG/2ML IJ SOLN
INTRAMUSCULAR | Status: AC
  Filled 2023-07-02: qty 2

## 2023-07-02 MED ORDER — LIDOCAINE 2% (20 MG/ML) 5 ML SYRINGE
INTRAMUSCULAR | Status: DC | PRN
  Administered 2023-07-02 (×2): 40 mg via INTRAVENOUS

## 2023-07-02 MED ORDER — MIDAZOLAM HCL 2 MG/2ML IJ SOLN
INTRAMUSCULAR | Status: DC | PRN
  Administered 2023-07-02: 2 mg via INTRAVENOUS

## 2023-07-02 MED ORDER — PROPOFOL 10 MG/ML IV BOLUS
INTRAVENOUS | Status: DC | PRN
  Administered 2023-07-02: 20 mg via INTRAVENOUS
  Administered 2023-07-02: 50 mg via INTRAVENOUS

## 2023-07-02 MED ORDER — HYDROMORPHONE HCL 1 MG/ML IJ SOLN
0.5000 mg | INTRAMUSCULAR | Status: DC | PRN
Start: 2023-07-02 — End: 2023-07-03

## 2023-07-02 MED ORDER — 0.9 % SODIUM CHLORIDE (POUR BTL) OPTIME
TOPICAL | Status: DC | PRN
  Administered 2023-07-02: 1000 mL

## 2023-07-02 MED ORDER — METOCLOPRAMIDE HCL 5 MG PO TABS: 5.0000 mg | ORAL_TABLET | Freq: Three times a day (TID) | ORAL | Status: DC | PRN

## 2023-07-02 MED ORDER — VANCOMYCIN HCL 1000 MG IV SOLR
INTRAVENOUS | Status: DC | PRN
  Administered 2023-07-02: 1000 mg

## 2023-07-02 MED ORDER — BUPIVACAINE-MELOXICAM ER 400-12 MG/14ML IJ SOLN
INTRAMUSCULAR | Status: AC
  Filled 2023-07-02: qty 1

## 2023-07-02 MED ORDER — METHOCARBAMOL 500 MG PO TABS
500.0000 mg | ORAL_TABLET | Freq: Four times a day (QID) | ORAL | Status: DC | PRN
  Administered 2023-07-03: 500 mg via ORAL
  Filled 2023-07-02: qty 1

## 2023-07-02 MED ORDER — VANCOMYCIN HCL 1000 MG IV SOLR
INTRAVENOUS | Status: AC
  Filled 2023-07-02: qty 20

## 2023-07-02 MED ORDER — OXYCODONE HCL ER 10 MG PO T12A
10.0000 mg | EXTENDED_RELEASE_TABLET | Freq: Two times a day (BID) | ORAL | Status: DC
  Administered 2023-07-02 – 2023-07-03 (×3): 10 mg via ORAL
  Filled 2023-07-02 (×3): qty 1

## 2023-07-02 MED ORDER — ACETAMINOPHEN 325 MG PO TABS: 325.0000 mg | ORAL_TABLET | Freq: Four times a day (QID) | ORAL | Status: DC | PRN

## 2023-07-02 MED ORDER — MIDAZOLAM HCL 2 MG/2ML IJ SOLN
INTRAMUSCULAR | Status: AC
  Administered 2023-07-02: 2 mg via INTRAVENOUS
  Filled 2023-07-02: qty 2

## 2023-07-02 MED ORDER — OXYCODONE HCL 5 MG/5ML PO SOLN: 5.0000 mg | Freq: Once | ORAL | Status: DC | PRN

## 2023-07-02 MED ORDER — KETOROLAC TROMETHAMINE 15 MG/ML IJ SOLN
7.5000 mg | Freq: Four times a day (QID) | INTRAMUSCULAR | Status: AC
  Administered 2023-07-02 – 2023-07-03 (×4): 7.5 mg via INTRAVENOUS
  Filled 2023-07-02 (×4): qty 1

## 2023-07-02 MED ORDER — FENTANYL CITRATE (PF) 250 MCG/5ML IJ SOLN
INTRAMUSCULAR | Status: AC
  Filled 2023-07-02: qty 5

## 2023-07-02 MED ORDER — OXYCODONE HCL 5 MG PO TABS
5.0000 mg | ORAL_TABLET | ORAL | Status: DC | PRN
  Administered 2023-07-02 (×2): 10 mg via ORAL
  Filled 2023-07-02 (×2): qty 2

## 2023-07-02 MED ORDER — PROPOFOL 500 MG/50ML IV EMUL
INTRAVENOUS | Status: DC | PRN
Start: 2023-07-02 — End: 2023-07-02
  Administered 2023-07-02: 60 ug/kg/min via INTRAVENOUS

## 2023-07-02 MED ORDER — MENTHOL 3 MG MT LOZG: 1.0000 | LOZENGE | OROMUCOSAL | Status: DC | PRN

## 2023-07-02 MED ORDER — ONDANSETRON HCL 4 MG/2ML IJ SOLN
INTRAMUSCULAR | Status: AC
  Filled 2023-07-02: qty 2

## 2023-07-02 MED ORDER — ACETAMINOPHEN 500 MG PO TABS
1000.0000 mg | ORAL_TABLET | Freq: Once | ORAL | Status: AC
  Administered 2023-07-02: 1000 mg via ORAL
  Filled 2023-07-02: qty 2

## 2023-07-02 MED ORDER — TRANEXAMIC ACID-NACL 1000-0.7 MG/100ML-% IV SOLN
1000.0000 mg | INTRAVENOUS | Status: AC
  Administered 2023-07-02: 1000 mg via INTRAVENOUS
  Filled 2023-07-02: qty 100

## 2023-07-02 MED ORDER — BUSPIRONE HCL 15 MG PO TABS
15.0000 mg | ORAL_TABLET | Freq: Every day | ORAL | Status: DC
  Administered 2023-07-03: 15 mg via ORAL
  Filled 2023-07-02: qty 1

## 2023-07-02 MED ORDER — LACTATED RINGERS IV SOLN: INTRAVENOUS | Status: DC

## 2023-07-02 MED ORDER — OXYCODONE HCL 5 MG PO TABS
10.0000 mg | ORAL_TABLET | ORAL | Status: DC | PRN
  Administered 2023-07-03: 10 mg via ORAL
  Filled 2023-07-02: qty 2

## 2023-07-02 MED ORDER — CEFAZOLIN SODIUM-DEXTROSE 2-4 GM/100ML-% IV SOLN
2.0000 g | Freq: Four times a day (QID) | INTRAVENOUS | Status: AC
  Administered 2023-07-02 (×2): 2 g via INTRAVENOUS
  Filled 2023-07-02 (×2): qty 100

## 2023-07-02 MED ORDER — ROPIVACAINE HCL 5 MG/ML IJ SOLN
INTRAMUSCULAR | Status: DC | PRN
  Administered 2023-07-02: 20 mL via PERINEURAL

## 2023-07-02 MED ORDER — PRONTOSAN WOUND IRRIGATION OPTIME
TOPICAL | Status: DC | PRN
  Administered 2023-07-02: 1

## 2023-07-02 MED ORDER — METOCLOPRAMIDE HCL 5 MG/ML IJ SOLN: 5.0000 mg | Freq: Three times a day (TID) | INTRAMUSCULAR | Status: DC | PRN

## 2023-07-02 MED ORDER — OXYCODONE HCL 5 MG PO TABS: 5.0000 mg | ORAL_TABLET | Freq: Once | ORAL | Status: DC | PRN

## 2023-07-02 MED ORDER — HYDROMORPHONE HCL 1 MG/ML IJ SOLN: 0.2500 mg | INTRAMUSCULAR | Status: DC | PRN

## 2023-07-02 MED ORDER — METHOCARBAMOL 1000 MG/10ML IJ SOLN: 500.0000 mg | Freq: Four times a day (QID) | INTRAMUSCULAR | Status: DC | PRN

## 2023-07-02 MED ORDER — DOCUSATE SODIUM 100 MG PO CAPS
100.0000 mg | ORAL_CAPSULE | Freq: Two times a day (BID) | ORAL | Status: DC
  Administered 2023-07-02 – 2023-07-03 (×3): 100 mg via ORAL
  Filled 2023-07-02 (×3): qty 1

## 2023-07-02 MED ORDER — SPIRONOLACTONE 25 MG PO TABS
50.0000 mg | ORAL_TABLET | Freq: Every day | ORAL | Status: DC
  Administered 2023-07-02 – 2023-07-03 (×2): 50 mg via ORAL
  Filled 2023-07-02 (×2): qty 2

## 2023-07-02 MED ORDER — FENTANYL CITRATE (PF) 250 MCG/5ML IJ SOLN
INTRAMUSCULAR | Status: DC | PRN
  Administered 2023-07-02: 50 ug via INTRAVENOUS

## 2023-07-02 MED ORDER — ORAL CARE MOUTH RINSE: 15.0000 mL | Freq: Once | OROMUCOSAL | Status: AC

## 2023-07-02 MED ORDER — ONDANSETRON HCL 4 MG PO TABS: 4.0000 mg | ORAL_TABLET | Freq: Four times a day (QID) | ORAL | Status: DC | PRN

## 2023-07-02 MED ORDER — CHLORHEXIDINE GLUCONATE 0.12 % MT SOLN
15.0000 mL | Freq: Once | OROMUCOSAL | Status: AC
  Administered 2023-07-02: 15 mL via OROMUCOSAL
  Filled 2023-07-02: qty 15

## 2023-07-02 MED ORDER — DEXAMETHASONE SODIUM PHOSPHATE 10 MG/ML IJ SOLN
10.0000 mg | Freq: Once | INTRAMUSCULAR | Status: AC
  Administered 2023-07-03: 10 mg via INTRAVENOUS
  Filled 2023-07-02: qty 1

## 2023-07-02 MED ORDER — LIDOCAINE 2% (20 MG/ML) 5 ML SYRINGE
INTRAMUSCULAR | Status: AC
  Filled 2023-07-02: qty 5

## 2023-07-02 MED ORDER — PHENOL 1.4 % MT LIQD: 1.0000 | OROMUCOSAL | Status: DC | PRN

## 2023-07-02 MED ORDER — MIDAZOLAM HCL 2 MG/2ML IJ SOLN: 2.0000 mg | Freq: Once | INTRAMUSCULAR | Status: AC

## 2023-07-02 MED ORDER — APIXABAN 2.5 MG PO TABS
2.5000 mg | ORAL_TABLET | Freq: Two times a day (BID) | ORAL | Status: DC
  Administered 2023-07-03: 2.5 mg via ORAL
  Filled 2023-07-02 (×2): qty 1

## 2023-07-02 MED ORDER — OXYCODONE HCL 5 MG PO TABS
ORAL_TABLET | ORAL | Status: AC
Start: 2023-07-02 — End: 2023-07-03
  Filled 2023-07-02: qty 2

## 2023-07-02 MED ORDER — ACETAMINOPHEN 500 MG PO TABS
1000.0000 mg | ORAL_TABLET | Freq: Four times a day (QID) | ORAL | Status: AC
  Administered 2023-07-02 – 2023-07-03 (×3): 1000 mg via ORAL
  Filled 2023-07-02 (×4): qty 2

## 2023-07-02 SURGICAL SUPPLY — 73 items
ALCOHOL 70% 16 OZ (MISCELLANEOUS) ×1 IMPLANT
BAG COUNTER SPONGE SURGICOUNT (BAG) IMPLANT
BAG DECANTER FOR FLEXI CONT (MISCELLANEOUS) ×1 IMPLANT
BANDAGE ESMARK 6X9 LF (GAUZE/BANDAGES/DRESSINGS) IMPLANT
BLADE SAG 18X100X1.27 (BLADE) ×1 IMPLANT
BLADE SAW SAG 90X13X1.27 (BLADE) IMPLANT
BLADE SAW SGTL 73X25 THK (BLADE) ×1 IMPLANT
BOWL SMART MIX CTS (DISPOSABLE) ×1 IMPLANT
CEMENT BONE REFOBACIN R1X40 US (Cement) IMPLANT
CLSR STERI-STRIP ANTIMIC 1/2X4 (GAUZE/BANDAGES/DRESSINGS) ×2 IMPLANT
COOLER ICEMAN CLASSIC (MISCELLANEOUS) ×1 IMPLANT
COVER SURGICAL LIGHT HANDLE (MISCELLANEOUS) ×1 IMPLANT
CUFF TOURN SGL QUICK 42 (TOURNIQUET CUFF) IMPLANT
CUFF TRNQT CYL 34X4.125X (TOURNIQUET CUFF) ×1 IMPLANT
DERMABOND ADVANCED .7 DNX12 (GAUZE/BANDAGES/DRESSINGS) ×1 IMPLANT
DRAPE EXTREMITY T 121X128X90 (DISPOSABLE) ×1 IMPLANT
DRAPE HALF SHEET 40X57 (DRAPES) ×1 IMPLANT
DRAPE INCISE IOBAN 66X45 STRL (DRAPES) ×1 IMPLANT
DRAPE POUCH INSTRU U-SHP 10X18 (DRAPES) ×1 IMPLANT
DRAPE SURG ORHT 6 SPLT 77X108 (DRAPES) IMPLANT
DRAPE U-SHAPE 47X51 STRL (DRAPES) ×2 IMPLANT
DRSG AQUACEL AG ADV 3.5X10 (GAUZE/BANDAGES/DRESSINGS) ×1 IMPLANT
DURAPREP 26ML APPLICATOR (WOUND CARE) ×3 IMPLANT
ELECT CAUTERY BLADE 6.4 (BLADE) ×1 IMPLANT
ELECT PENCIL ROCKER SW 15FT (MISCELLANEOUS) ×1 IMPLANT
ELECTRODE REM PT RTRN 9FT ADLT (ELECTROSURGICAL) ×1 IMPLANT
FEMUR CMTD CR STD SZ 6 RT KNEE (Joint) IMPLANT
GLOVE BIOGEL PI IND STRL 7.0 (GLOVE) ×2 IMPLANT
GLOVE BIOGEL PI IND STRL 7.5 (GLOVE) ×5 IMPLANT
GLOVE ECLIPSE 7.0 STRL STRAW (GLOVE) ×3 IMPLANT
GLOVE INDICATOR 7.0 STRL GRN (GLOVE) ×1 IMPLANT
GLOVE INDICATOR 7.5 STRL GRN (GLOVE) ×1 IMPLANT
GLOVE SURG SYN 7.5 E (GLOVE) ×2 IMPLANT
GLOVE SURG SYN 7.5 PF PI (GLOVE) ×2 IMPLANT
GLOVE SURG UNDER LTX SZ7.5 (GLOVE) ×2 IMPLANT
GLOVE SURG UNDER POLY LF SZ7 (GLOVE) ×2 IMPLANT
GOWN STRL REUS W/ TWL LRG LVL3 (GOWN DISPOSABLE) ×1 IMPLANT
GOWN STRL SURGICAL XL XLNG (GOWN DISPOSABLE) ×1 IMPLANT
GOWN TOGA ZIPPER T7+ PEEL AWAY (MISCELLANEOUS) ×2 IMPLANT
HOOD PEEL AWAY T7 (MISCELLANEOUS) ×1 IMPLANT
KIT BASIN OR (CUSTOM PROCEDURE TRAY) ×1 IMPLANT
KIT TURNOVER KIT B (KITS) ×1 IMPLANT
LINER ASF PERS 10X6/7 CD RT (Liner) IMPLANT
MANIFOLD NEPTUNE II (INSTRUMENTS) ×1 IMPLANT
MARKER SKIN DUAL TIP RULER LAB (MISCELLANEOUS) ×2 IMPLANT
NDL SPNL 18GX3.5 QUINCKE PK (NEEDLE) ×1 IMPLANT
NEEDLE SPNL 18GX3.5 QUINCKE PK (NEEDLE) ×1 IMPLANT
NS IRRIG 1000ML POUR BTL (IV SOLUTION) ×1 IMPLANT
PACK TOTAL JOINT (CUSTOM PROCEDURE TRAY) ×1 IMPLANT
PAD ARMBOARD POSITIONER FOAM (MISCELLANEOUS) ×2 IMPLANT
PAD COLD SHLDR WRAP-ON (PAD) ×1 IMPLANT
PIN DRILL HDLS TROCAR 75 4PK (PIN) IMPLANT
SCREW FEMALE HEX FIX 25X2.5 (ORTHOPEDIC DISPOSABLE SUPPLIES) IMPLANT
SET HNDPC FAN SPRY TIP SCT (DISPOSABLE) ×1 IMPLANT
SOLUTION PRONTOSAN WOUND 350ML (IRRIGATION / IRRIGATOR) ×1 IMPLANT
STAPLER VISISTAT 35W (STAPLE) IMPLANT
STEM POLY PAT PLY 29M KNEE (Knees) IMPLANT
STEM TIB ST PERS 14+30 (Stem) IMPLANT
STEM TIBIA 5 DEG SZ D R KNEE (Knees) IMPLANT
SUCTION TUBE FRAZIER 10FR DISP (SUCTIONS) ×1 IMPLANT
SUT ETHILON 2 0 FS 18 (SUTURE) IMPLANT
SUT MNCRL AB 3-0 PS2 27 (SUTURE) IMPLANT
SUT VIC AB 0 CT1 27XBRD ANBCTR (SUTURE) ×2 IMPLANT
SUT VIC AB 1 CTX 27 (SUTURE) ×3 IMPLANT
SUT VIC AB 2-0 CT1 (SUTURE) IMPLANT
SUT VIC AB 2-0 CT1 TAPERPNT 27 (SUTURE) ×4 IMPLANT
SYR 50ML LL SCALE MARK (SYRINGE) ×2 IMPLANT
TOWEL GREEN STERILE (TOWEL DISPOSABLE) ×1 IMPLANT
TOWEL GREEN STERILE FF (TOWEL DISPOSABLE) ×1 IMPLANT
TRAY CATH INTERMITTENT SS 16FR (CATHETERS) IMPLANT
TUBE SUCT ARGYLE STRL (TUBING) ×1 IMPLANT
UNDERPAD 30X36 HEAVY ABSORB (UNDERPADS AND DIAPERS) ×1 IMPLANT
YANKAUER SUCT BULB TIP NO VENT (SUCTIONS) ×2 IMPLANT

## 2023-07-02 NOTE — Transfer of Care (Signed)
 Immediate Anesthesia Transfer of Care Note  Patient: Marilyn [name]  Procedure(s) Performed: ARTHROPLASTY, KNEE, TOTAL (Right: Knee)  Patient Location: PACU  Anesthesia Type:Spinal  Level of Consciousness: awake  Airway & Oxygen Therapy: Patient Spontanous Breathing  Post-op Assessment: Report given to RN, Post -op Vital signs reviewed and stable, and Patient able to stick tongue midline  Post vital signs: Reviewed and stable  Last Vitals:  Vitals Value Taken Time  BP 97/54   Temp 97.5   Pulse 81   Resp 16   SpO2 99     Last Pain:  Vitals:   07/02/23 0714  PainSc: 3          Complications: No notable events documented.

## 2023-07-02 NOTE — Anesthesia Procedure Notes (Signed)
 Spinal  Patient location during procedure: OR Start time: 07/02/2023 8:48 AM End time: 07/02/2023 8:52 AM Reason for block: surgical anesthesia Staffing Performed: anesthesiologist  Anesthesiologist: Conard Decent, MD Performed by: Conard Decent, MD Authorized by: Conard Decent, MD   Preanesthetic Checklist Completed: patient identified, IV checked, site marked, risks and benefits discussed, surgical consent, monitors and equipment checked, pre-op evaluation and timeout performed Spinal Block Patient position: sitting Prep: ChloraPrep Patient monitoring: blood pressure and continuous pulse ox Approach: midline Location: L3-4 Injection technique: single-shot Needle Needle type: Pencan  Needle gauge: 24 G Needle length: 9 cm Additional Notes Risks and benefits of neuraxial anesthesia including, but not limited to, infection, bleeding, local anesthetic toxicity, headache, hypotension, back pain, block failure, etc. were discussed with the patient. The patient expressed understanding and consented to the procedure. I confirmed that the patient has no bleeding disorders and is not taking blood thinners. I confirmed the patient's last platelet count with the nurse. Monitors were applied. A time-out was performed immediately prior to the procedure. Sterile technique was used throughout the whole procedure.   1 attempt(s)

## 2023-07-02 NOTE — Progress Notes (Signed)
 Orthopedic Tech Progress Note Patient Details:  Marilyn [name] 19-Apr-1955 [id number]  Ortho Devices Type of Ortho Device: Bone foam zero knee Ortho Device/Splint Location: RLE Ortho Device/Splint Interventions: Ordered, Other (comment) dropped off to [facility] room 10   Post Interventions Patient Tolerated: Other (comment) Instructions Provided: Other (comment)  Kermitt Pedlar 07/02/2023, 12:37 PM

## 2023-07-02 NOTE — Anesthesia Postprocedure Evaluation (Signed)
 Anesthesia Post Note  Patient: Marilyn [name]  Procedure(s) Performed: ARTHROPLASTY, KNEE, TOTAL (Right: Knee)     Patient location during evaluation: PACU Anesthesia Type: MAC and Spinal Level of consciousness: awake Pain management: pain level controlled Vital Signs Assessment: post-procedure vital signs reviewed and stable Respiratory status: spontaneous breathing, respiratory function stable and nonlabored ventilation Cardiovascular status: blood pressure returned to baseline and stable Postop Assessment: no headache, no backache and no apparent nausea or vomiting Anesthetic complications: no   No notable events documented.  Last Vitals:  Vitals:   07/02/23 1200 07/02/23 1215  BP: (!) 145/89 (!) 167/86  Pulse: (!) 51 (!) 56  Resp: 12 13  Temp:    SpO2: 97% 94%    Last Pain:  Vitals:   07/02/23 1215  PainSc: Asleep    LLE Motor Response: No movement due to regional block (07/02/23 1215) LLE Sensation: Decreased (07/02/23 1215) RLE Motor Response: No movement due to regional block (07/02/23 1215) RLE Sensation: Decreased (07/02/23 1215) L Sensory Level: L3-Anterior knee, lower leg (07/02/23 1215) R Sensory Level: L3-Anterior knee, lower leg (07/02/23 1215)  Conard Decent

## 2023-07-02 NOTE — Evaluation (Addendum)
 Physical Therapy Evaluation Patient Details Name: Marilyn [name] MRN: [id number] DOB: 1955-07-07 Today's Date: 07/02/2023  History of Present Illness  Pt is a 68 y.o. F who presents s/p R TKA 07/02/2023. Significant PMH: COPD.  Clinical Impression  Pt admitted s/p R TKA. PTA, pt lives with her spouse and is independent. Pt reports persistent numbness in the surgical limb since the procedure and feeling "heavy." Pt with intact ankle dorsiflexion, but unable to lift against gravity quite yet sitting on edge of bed. Pt able to stand from edge of bed to RW with minimal assist, but mainly relying on left lower extremity and further gait deferred due to lack of RLE motor control. Suspect good progress once sensation returns. Will follow up tomorrow to finish reviewing HEP, gait and stair training prior to d/c home.         If plan is discharge home, recommend the following: Assistance with cooking/housework;Assist for transportation;Help with stairs or ramp for entrance   Can travel by private vehicle        Equipment Recommendations None recommended by PT (pt equipped)  Recommendations for Other Services       Functional Status Assessment Patient has had a recent decline in their functional status and demonstrates the ability to make significant improvements in function in a reasonable and predictable amount of time.     Precautions / Restrictions Precautions Precautions: Fall Restrictions Weight Bearing Restrictions Per Provider Order: No      Mobility  Bed Mobility Overal bed mobility: Needs Assistance Bed Mobility: Supine to Sit, Sit to Supine     Supine to sit: Min assist Sit to supine: Min assist   General bed mobility comments: Assist for RLE negotiation    Transfers Overall transfer level: Needs assistance Equipment used: Rolling walker (2 wheels) Transfers: Sit to/from Stand Sit to Stand: Min assist           General transfer comment: MinA to power up  from edge of bed; pt successful on second attempt. Cues for hand placement, pt relying on LLE to power up    Ambulation/Gait               General Gait Details: deferred due to continued numbness and lack of quad activation  Stairs            Wheelchair Mobility     Tilt Bed    Modified Rankin (Stroke Patients Only)       Balance Overall balance assessment: Needs assistance Sitting-balance support: Feet supported Sitting balance-Leahy Scale: Good     Standing balance support: Bilateral upper extremity supported Standing balance-Leahy Scale: Poor Standing balance comment: reliant on BUE support                             Pertinent Vitals/Pain Pain Assessment Pain Assessment: Faces Faces Pain Scale: Hurts little more Pain Location: R knee Pain Descriptors / Indicators: Operative site guarding, Grimacing Pain Intervention(s): Monitored during session, Premedicated before session, Ice applied    Home Living Family/patient expects to be discharged to:: Private residence Living Arrangements: Spouse/significant other Available Help at Discharge: Family Type of Home: House Home Access: Stairs to enter   [employer] of Steps: 1 (small slab; has to walk around back through grass to get to it. Otherwise, through front entrance, would have to go up 4 steps, no rails, and then get on stair lift to go to bedroom) Alternate Level Stairs-Number of  Steps:  (level entry to bedroom from back door) Home Layout: Multi-level Home Equipment: Rolling Walker (2 wheels);Cane - single point;Lift chair;Other (comment) (stair lift, toilet riser)      Prior Function Prior Level of Function : Independent/Modified Independent             Mobility Comments: Retired from post office       Extremity/Trunk Assessment   Upper Extremity Assessment Upper Extremity Assessment: Overall WFL for tasks assessed    Lower Extremity Assessment Lower Extremity  Assessment: RLE deficits/detail RLE Deficits / Details: s/p TKA. Able to perform quad set, but unable to do LAQ. Ankle dorsiflexion WFL    Cervical / Trunk Assessment Cervical / Trunk Assessment: Normal  Communication   Communication Communication: No apparent difficulties    Cognition Arousal: Alert Behavior During Therapy: WFL for tasks assessed/performed   PT - Cognitive impairments: No apparent impairments                         Following commands: Intact       Cueing Cueing Techniques: Verbal cues     General Comments      Exercises Total Joint Exercises Ankle Circles/Pumps: AROM, Both, 5 reps, Supine Quad Sets: AROM, Both, 5 reps, Supine   Assessment/Plan    PT Assessment Patient needs continued PT services  PT Problem List Decreased strength;Decreased range of motion;Decreased activity tolerance;Decreased balance;Decreased mobility;Pain       PT Treatment Interventions DME instruction;Stair training;Gait training;Functional mobility training;Therapeutic activities;Therapeutic exercise;Balance training;Patient/family education    PT Goals (Current goals can be found in the Care Plan section)  Acute Rehab PT Goals Patient Stated Goal: to walk PT Goal Formulation: With patient Time For Goal Achievement: 07/16/23 Potential to Achieve Goals: Good    Frequency 7X/week     Co-evaluation               AM-PAC PT "6 Clicks" Mobility  Outcome Measure Help needed turning from your back to your side while in a flat bed without using bedrails?: None Help needed moving from lying on your back to sitting on the side of a flat bed without using bedrails?: None Help needed moving to and from a bed to a chair (including a wheelchair)?: A Little Help needed standing up from a chair using your arms (e.g., wheelchair or bedside chair)?: A Little Help needed to walk in hospital room?: Total Help needed climbing 3-5 steps with a railing? : Total 6 Click  Score: 16    End of Session Equipment Utilized During Treatment: Gait belt Activity Tolerance: Patient tolerated treatment well Patient left: in bed;with call bell/phone within reach Nurse Communication: Mobility status PT Visit Diagnosis: Unsteadiness on feet (R26.81);Other abnormalities of gait and mobility (R26.89);Pain;Difficulty in walking, not elsewhere classified (R26.2) Pain - Right/Left: Right Pain - part of body: Knee    Time: 7846-9629 PT Time Calculation (min) (ACUTE ONLY): 27 min   Charges:   PT Evaluation $PT Eval Low Complexity: 1 Low PT Treatments $Therapeutic Activity: 8-22 mins PT General Charges $$ ACUTE PT VISIT: 1 Visit         Verdia Glad, PT, DPT Acute Rehabilitation Services Office [phone]   Claria Crofts 07/02/2023, 4:09 PM

## 2023-07-02 NOTE — H&P (Signed)
 PREOPERATIVE H&P  Chief Complaint: right knee osteoarthritis  HPI: Marilyn [name] is a 68 y.o. female who presents for surgical treatment of right knee osteoarthritis.  She denies any changes in medical history.  Past Surgical History:  Procedure Laterality Date  . BIOPSY BREAST     left breast  as a teenager  benign  . BREAST BIOPSY Left 06/06/2011  . BREAST EXCISIONAL BIOPSY Bilateral   . BREAST LUMPECTOMY Left 07-31-2011  . BREAST SURGERY  07/05/11   left breast lumpectomy with needle loc & axillary sln bx  . BUNIONECTOMY  July 30, 2009   bilateral by Dr Arnetha Langton  . CHOLECYSTECTOMY  02/2019  . COLONOSCOPY     polyp  . cyst removed from right hand  1995  . POLYPECTOMY    . TONSILLECTOMY  1972  . TOTAL ABDOMINAL HYSTERECTOMY  07/31/98   Dr Andree Kayser  . TRIGGER FINGER RELEASE  31-Jul-2006   Dr Donzella Galley   Social History   Socioeconomic History  . Marital status: Married    Spouse name: Not on file  . Number of children: 2  . Years of education: Not on file  . Highest education level: Bachelor's degree (e.g., BA, AB, BS)  Occupational History  . Occupation: retired  Tobacco Use  . Smoking status: Former    Current packs/day: 0.00    Types: Cigarettes    Quit date: 12/28/2020    Years since quitting: 2.5  . Smokeless tobacco: Never  Vaping Use  . Vaping status: Never Used  Substance and Sexual Activity  . Alcohol use: Yes    Alcohol/week: 0.0 - 1.0 standard drinks of alcohol    Comment: social  . Drug use: No  . Sexual activity: Not Currently    Birth control/protection: Surgical    Comment: menses age 47,hrt started  July 31, 1998  Other Topics Concern  . Not on file  Social History Narrative   Widowed - husband died Jul 31, 2003 and 2 step-childrenExercises 3x per week2 cups of caffeine daily   Social Drivers of Health   Financial Resource Strain: Low Risk  (08/07/2022)   Overall Financial Resource Strain (CARDIA)   . Difficulty of Paying Living Expenses: Not very hard  Food Insecurity: No  Food Insecurity (08/07/2022)   Hunger Vital Sign   . Worried About Programme researcher, broadcasting/film/video in the Last Year: Never true   . Ran Out of Food in the Last Year: Never true  Transportation Needs: No Transportation Needs (08/07/2022)   PRAPARE - Transportation   . Lack of Transportation (Medical): No   . Lack of Transportation (Non-Medical): No  Physical Activity: Unknown (08/07/2022)   Exercise Vital Sign   . Days of Exercise per Week: 0 days   . Minutes of Exercise per Session: Not on file  Stress: No Stress Concern Present (08/07/2022)   [employer] of Occupational Health - Occupational Stress Questionnaire   . Feeling of Stress : Not at all  Social Connections: Socially Integrated (08/07/2022)   Social Connection and Isolation Panel [NHANES]   . Frequency of Communication with Friends and Family: Three times a week   . Frequency of Social Gatherings with Friends and Family: Once a week   . Attends Religious Services: More than 4 times per year   . Active Member of Clubs or Organizations: Yes   . Attends Banker Meetings: 1 to 4 times per year   . Marital Status: Married   Family History  Problem Relation Age of Onset  .  Dementia Mother   . Hypertension Mother   . Anemia Mother   . Other Mother        + H Pylori  . Seizures Mother   . Hypertension Father   . Prostate cancer Father        dx over 74  . Colon cancer Father 57       diagnosed 2009  . Bladder Cancer Father        dx over 108  . Colon polyps Father   . Hypertension Sister   . Breast cancer Sister        dx in her 30s; mat half sister  . Pancreatic cancer Sister 51       d. 39  . Lung cancer Sister   . Hypertension Sister   . Sarcoidosis Sister        mat 1/2 sister  . Multiple sclerosis Brother        #2  . Hypertension Brother        #1  . Hyperlipidemia Brother   . Prostate cancer Brother        dx under 50  . Dementia Maternal Aunt   . Dementia Maternal Aunt   . Seizures Maternal Uncle    . Lung cancer Maternal Uncle   . Lung cancer Paternal Aunt   . Breast cancer Cousin        pat first cousin d. 48  . Sarcoidosis Cousin        mat first cousin  . Breast cancer Cousin        maternal 2nd cousins - distant  . Brain cancer Other        benign  . Esophageal cancer Neg Hx   . Stomach cancer Neg Hx   . Rectal cancer Neg Hx    Allergies  Allergen Reactions  . Lexapro [Escitalopram Oxalate]     Felt like zombie  . Microzide  [Hydrochlorothiazide ]     Palpitations  . Penicillins Rash and Other (See Comments)    At injection site.   Prior to Admission medications   Medication Sig Start Date End Date Taking? Authorizing Provider  albuterol  (VENTOLIN  HFA) 108 (90 Base) MCG/ACT inhaler Inhale 2 puffs into the lungs every 6 (six) hours as needed for wheezing or shortness of breath. 01/04/23  Yes Olalere, Adewale A, MD  ALPRAZolam  (XANAX ) 1 MG tablet Take 0.5-1 tablets (0.5-1 mg total) by mouth 3 (three) times daily as needed for anxiety. 02/08/23  Yes Plotnikov, Oakley Bellman, MD  budesonide -formoterol  (SYMBICORT ) 80-4.5 MCG/ACT inhaler Inhale 2 puffs into the lungs 2 (two) times daily. 01/24/23  Yes Plotnikov, Oakley Bellman, MD  busPIRone  (BUSPAR ) 30 MG tablet TAKE 1/2 TO 1 (ONE-HALF TO ONE) TABLET BY MOUTH TWICE DAILY Patient taking differently: Take 15 mg by mouth daily. 12/22/22  Yes Plotnikov, Oakley Bellman, MD  docusate sodium  (COLACE) 100 MG capsule Take 1 capsule (100 mg total) by mouth daily as needed. 06/27/23 06/26/24  Sandie Cross, PA-C  Menthol, Topical Analgesic, (BIOFREEZE) 10 % CREA Apply 1 application  topically daily as needed (pain).   Yes [provider]  methocarbamol  (ROBAXIN -750) 750 MG tablet Take 1 tablet (750 mg total) by mouth 3 (three) times daily as needed for muscle spasms. 06/27/23   Sandie Cross, PA-C  ondansetron  (ZOFRAN ) 4 MG tablet Take 1 tablet (4 mg total) by mouth every 8 (eight) hours as needed for nausea or vomiting. 06/27/23   Sandie Cross, PA-C  oxyCODONE -acetaminophen  (  PERCOCET) 5-325 MG tablet Take 1-2 tablets by mouth every 6 (six) hours as needed. To be taken after surgery 06/27/23   Sandie Cross, PA-C  rosuvastatin  (CRESTOR ) 10 MG tablet Take 1 tablet (10 mg total) by mouth daily. 03/15/23 06/20/23 Yes Maudine Sos, MD  spironolactone  (ALDACTONE ) 50 MG tablet Take 1 tablet (50 mg total) by mouth daily. 05/01/23  Yes Walker, Caitlin S, NP  Trolamine Salicylate (BLUE-EMU HEMP EX) Apply 1 Application topically daily as needed (pain).   Yes [provider]  clobetasol  cream (TEMOVATE ) 0.05 % Apply 1 application. topically 2 (two) times daily. Patient not taking: Reported on 06/20/2023 06/28/21   Plotnikov, Aleksei V, MD  diclofenac  Sodium (VOLTAREN ) 1 % GEL Apply 4 g topically 4 (four) times daily. Patient not taking: Reported on 06/20/2023 08/11/19   Corine Dice, MD  triamcinolone  ointment (KENALOG ) 0.1 % Apply 1 Application topically 3 (three) times daily. Patient not taking: Reported on 06/20/2023 12/29/21   Plotnikov, Oakley Bellman, MD  pantoprazole  (PROTONIX ) 40 MG tablet Take 1 tablet (40 mg total) by mouth daily. Patient not taking: No sig reported 04/28/19 08/11/19  Plotnikov, Oakley Bellman, MD     Positive ROS: All other systems have been reviewed and were otherwise negative with the exception of those mentioned in the HPI and as above.  Physical Exam: General: Alert, no acute distress Cardiovascular: No pedal edema Respiratory: No cyanosis, no use of accessory musculature GI: abdomen soft Skin: No lesions in the area of chief complaint Neurologic: Sensation intact distally Psychiatric: Patient is competent for consent with normal mood and affect Lymphatic: no lymphedema  MUSCULOSKELETAL: exam stable  Assessment: right knee osteoarthritis  Plan: Plan for Procedure(s): ARTHROPLASTY, KNEE, TOTAL  The risks benefits and alternatives were discussed with the patient including but not limited to the  risks of nonoperative treatment, versus surgical intervention including infection, bleeding, nerve injury,  blood clots, cardiopulmonary complications, morbidity, mortality, among others, and they were willing to proceed.   Claria Crofts, MD 07/02/2023 6:00 AM

## 2023-07-02 NOTE — Op Note (Signed)
 Total Knee Arthroplasty Procedure Note  Preoperative diagnosis: Right knee osteoarthritis  Postoperative diagnosis:same  Operative findings: Complete loss joint space from medial and patellofemoral compartments  Operative procedure: Right total knee arthroplasty. CPT [id number]  Surgeon: N. Claria Crofts, MD  Assist: Sharran Decent, PA-C; necessary for the timely completion of procedure and due to complexity of procedure.  Anesthesia: Spinal, regional, local  Tourniquet time: see anesthesia record  Implants used: Zimmer persona Femur: CR 6 Tibia: D with 30 mm cemented stem Patella: 29 mm Polyethylene: 10 mm medial congruent  Indication: Marilyn [name] is a 68 y.o. year old female with a history of knee pain. Having failed conservative management, the patient elected to proceed with a total knee arthroplasty.  We have reviewed the risk and benefits of the surgery and they elected to proceed after voicing understanding.  Procedure:  After informed consent was obtained and understanding of the risk were voiced including but not limited to bleeding, infection, damage to surrounding structures including nerves and vessels, blood clots, leg length inequality and the failure to achieve desired results, the operative extremity was marked with verbal confirmation of the patient in the holding area.   The patient was then brought to the operating room and transported to the operating room table in the supine position.  A tourniquet was applied to the operative extremity around the upper thigh. The operative limb was then prepped and draped in the usual sterile fashion and preoperative antibiotics were administered.  A time out was performed prior to the start of surgery confirming the correct extremity, preoperative antibiotic administration, as well as team members, implants and instruments available for the case. Correct surgical site was also confirmed with preoperative  radiographs. The limb was then elevated for exsanguination and the tourniquet was inflated to 300 mmHg. A midline incision was made and a standard medial parapatellar approach was performed.  The infrapatellar fat pad was removed.  Suprapatellar synovium was removed to reveal the anterior distal femoral cortex.  A medial peel was performed to release the capsule and the deep MCL off of the medial tibial plateau back to the semimembranosus.  The patella was then everted which showed complete loss of articular cartilage and was prepared and sized to a 29 mm.  A cover was placed on the patella for protection from retractors.  The knee was then brought into full flexion and we then turned our attention to the femur.  The ACL was sacrificed.  Start site was drilled in the femur and the intramedullary distal femoral cutting guide was placed, set at 5 degrees valgus, taking 10 mm of distal resection. The distal cut was made. Osteophytes were then removed.  Next, the proximal tibial cutting guide was placed with appropriate slope, varus/valgus alignment and depth of resection.  The drop rod was attached to confirm that it was aimed at the second metatarsal.  The proximal tibial cut was made taking 2 mm off the lower medial side. Gap blocks were then used to assess the extension gap and alignment, and appropriate soft tissue releases were performed. Attention was turned back to the femur, which was sized using the sizing guide to a size 6. Appropriate rotation of the femoral component was determined using epicondylar axis, Whiteside's line, and assessing the flexion gap under ligament tension. The appropriate size 4-in-1 cutting block was placed and checked with an angel wing and cuts were made. Posterior femoral osteophytes and uncapped bone were then removed with the curved osteotome.  The menisci were removed.  Trial components were placed, and stability was checked in full extension, mid-flexion, and deep flexion.  PCL  was resected to balance the flexion space. Proper tibial rotation was determined and marked.  A lateral release was required to allow proper patellar tracking.  The femoral lugs were then drilled. Trial components were then removed and tibial preparation performed.  The trial tibia was pointed to the medial third of the tibial tubercle.  The tibia was sized for a size D component.   The bony surfaces were irrigated with a pulse lavage and then dried. Bone cement was vacuum mixed on the back table, and the final components sized above were cemented into place.  Antibiotic irrigation was placed in the knee joint and soft tissues while the cement cured.  After cement had finished curing, excess cement was removed. The stability of the construct was re-evaluated throughout a range of motion and found to be acceptable. The trial liner was removed, the knee was copiously irrigated, and the knee was re-evaluated for any excess bone debris. The real polyethylene liner, 10 mm thick, was inserted and checked to ensure the locking mechanism had engaged appropriately. The tourniquet was deflated and hemostasis was achieved. The wound was irrigated with normal saline.  One gram of vancomycin powder was placed in the surgical bed.  Topical mixture of 0.25% bupivacaine  and meloxicam was placed in the joint for postoperative pain.  Capsular closure was performed with a #1 stratafix in flexion, subcutaneous fat closed with a 0 vicryl suture, then subcutaneous tissue closed with interrupted 2.0 vicryl suture. The skin was then closed with a 2.0 nylon and dermabond. A sterile dressing was applied.  The patient was awakened in the operating room and taken to recovery in stable condition. All sponge, needle, and instrument counts were correct at the end of the case.  Trellis Fries was necessary for opening, closing, retracting, limb positioning and overall facilitation and completion of the surgery.  Position: supine   Complications: none.  Time Out: performed   Drains/Packing: none  Estimated blood loss: minimal  Returned to Recovery Room: in good condition.   Antibiotics: yes   Mechanical VTE (DVT) Prophylaxis: sequential compression devices, TED thigh-high  Chemical VTE (DVT) Prophylaxis: eliquis  Fluid Replacement  Crystalloid: see anesthesia record Blood: none  FFP: none   Specimens Removed: 1 to pathology   Sponge and Instrument Count Correct? yes   PACU: portable radiograph - knee AP and Lateral   Plan/RTC: Return in 2 weeks for wound check.   Weight Bearing/Load Lower Extremity: full   Implant Name Type Inv. Item Serial No. Manufacturer Lot No. LRB No. Used Action  CEMENT BONE REFOBACIN R1X40 US  - [id number] Cement CEMENT BONE REFOBACIN R1X40 US   ZIMMER RECON(ORTH,TRAU,BIO,SG) [id number] Right 1 Implanted  CEMENT BONE REFOBACIN R1X40 US  - [id number] Cement CEMENT BONE REFOBACIN R1X40 US   ZIMMER RECON(ORTH,TRAU,BIO,SG) [id number] Right 1 Implanted  STEM TIB ST PERS 14+30 - [id number] Stem STEM TIB ST PERS 14+30  ZIMMER RECON(ORTH,TRAU,BIO,SG) [id number] Right 1 Implanted  FEMUR CMTD CR STD SZ 6 RT KNEE - [id number] Joint FEMUR CMTD CR STD SZ 6 RT KNEE  ZIMMER RECON(ORTH,TRAU,BIO,SG) [id number] Right 1 Implanted  LINER ASF PERS 10X6/7 CD RT - [id number] Liner LINER ASF PERS 10X6/7 CD RT  ZIMMER RECON(ORTH,TRAU,BIO,SG) [id number] Right 1 Implanted  STEM TIBIA 5 DEG SZ D R KNEE - [id number] Knees STEM TIBIA 5 DEG SZ D R KNEE  ZIMMER RECON(ORTH,TRAU,BIO,SG) [id number] Right 1 Implanted  STEM POLY PAT PLY 17M KNEE - [id number] Knees STEM POLY PAT PLY 17M KNEE  ZIMMER RECON(ORTH,TRAU,BIO,SG) [id number] Right 1 Implanted    N. Claria Crofts, MD [facility] 10:17 AM

## 2023-07-02 NOTE — Discharge Instructions (Signed)

## 2023-07-02 NOTE — Anesthesia Procedure Notes (Signed)
 Anesthesia Regional Block: Adductor canal block   Pre-Anesthetic Checklist: , timeout performed,  Correct Patient, Correct Site, Correct Laterality,  Correct Procedure, Correct Position, site marked,  Risks and benefits discussed,  Surgical consent,  Pre-op evaluation,  At surgeon's request and post-op pain management  Laterality: Right  Prep: chloraprep       Needles:  Injection technique: Single-shot  Needle Type: Echogenic Stimulator Needle     Needle Length: 9cm  Needle Gauge: 21     Additional Needles:   Procedures:,,,, ultrasound used (permanent image in chart),,    Narrative:  Start time: 07/02/2023 7:37 AM End time: 07/02/2023 7:40 AM Injection made incrementally with aspirations every 5 mL.  Performed by: Personally  Anesthesiologist: Conard Decent, MD  Additional Notes: Discussed risks and benefits of nerve block including, but not limited to, prolonged and/or permanent nerve injury involving sensory and/or motor function. Monitors were applied and a time-out was performed. The nerve and associated structures were visualized under ultrasound guidance. After negative aspiration, local anesthetic was slowly injected around the nerve. There was no evidence of high pressure during the procedure. There were no paresthesias. VSS remained stable and the patient tolerated the procedure well.

## 2023-07-03 ENCOUNTER — Encounter (HOSPITAL_COMMUNITY): Payer: Self-pay | Admitting: Orthopaedic Surgery

## 2023-07-03 ENCOUNTER — Other Ambulatory Visit (HOSPITAL_COMMUNITY): Payer: Self-pay

## 2023-07-03 DIAGNOSIS — M1711 Unilateral primary osteoarthritis, right knee: Secondary | ICD-10-CM | POA: Diagnosis not present

## 2023-07-03 MED ORDER — HYDROXYZINE HCL 50 MG/ML IM SOLN
50.0000 mg | Freq: Once | INTRAMUSCULAR | Status: AC
  Administered 2023-07-03: 50 mg via INTRAMUSCULAR
  Filled 2023-07-03: qty 1

## 2023-07-03 NOTE — Progress Notes (Addendum)
 Subjective: 1 Day Post-Op Procedure(s) (LRB): ARTHROPLASTY, KNEE, TOTAL (Right) Patient reports pain as mild.  C/o Some nausea which has since resolved.  Objective: Vital signs in last 24 hours: Temp:  [97.5 F (36.4 C)-99.1 F (37.3 C)] 98 F (36.7 C) (05/06 0345) Pulse Rate:  [44-91] 91 (05/06 0345) Resp:  [12-19] 18 (05/06 0345) BP: (97-167)/(43-89) 107/58 (05/06 0345) SpO2:  [94 %-100 %] 98 % (05/06 0345)  Intake/Output from previous day: 05/05 0701 - 05/06 0700 In: 580 [P.O.:480; IV Piggyback:100] Out: 650 [Urine:600; Blood:50] Intake/Output this shift: No intake/output data recorded.  No results for input(s): "HGB" in the last 72 hours. No results for input(s): "WBC", "RBC", "HCT", "PLT" in the last 72 hours. No results for input(s): "NA", "K", "CL", "CO2", "BUN", "CREATININE", "GLUCOSE", "CALCIUM " in the last 72 hours. No results for input(s): "LABPT", "INR" in the last 72 hours.  Neurologically intact Neurovascular intact Sensation intact distally Intact pulses distally Dorsiflexion/Plantar flexion intact Incision: scant drainage No cellulitis present Compartment soft   Assessment/Plan: 1 Day Post-Op Procedure(s) (LRB): ARTHROPLASTY, KNEE, TOTAL (Right) Advance diet Up with therapy D/C IV fluids Discharge home with home health once cleared by PT WBAT RLE      Sandie Cross 07/03/2023, 8:19 AM

## 2023-07-03 NOTE — Progress Notes (Signed)
 Physical Therapy Treatment Patient Details Name: Marilyn [name] MRN: [id number] DOB: 14-Jul-1955 Today's Date: 07/03/2023   History of Present Illness Pt is a 68 y.o. F who presents s/p R TKA 07/02/2023. Significant PMH: COPD.    PT Comments  Pt received in chair after OT. Pt moving RLE better this AM than yesterday on eval but pt feeling like she is having difficulty catching her breath and seeming anxious. Breathing stabilized and she did R TKA exercises. Pt ambulated 150' with RW working on sequencing and posture during gait. Pt also practiced 2 steps with rail and HHA. Pt able to get RLE into bed to return to supine, however, then became nauseous and began vomiting. RN notified. Pt does not feel that she is quite ready for d/c. PT will plan to see her one more time after lunch before d/c home.     If plan is discharge home, recommend the following: Assistance with cooking/housework;Assist for transportation;Help with stairs or ramp for entrance   Can travel by private vehicle        Equipment Recommendations  None recommended by PT (pt equipped)    Recommendations for Other Services       Precautions / Restrictions Precautions Precautions: Fall Recall of Precautions/Restrictions: Intact Required Braces or Orthoses:  (bone foam in bed) Restrictions Weight Bearing Restrictions Per Provider Order: Yes RLE Weight Bearing Per Provider Order: Weight bearing as tolerated     Mobility  Bed Mobility Overal bed mobility: Needs Assistance Bed Mobility: Sit to Supine       Sit to supine: Supervision   General bed mobility comments: pt able to lift RLE to get into bed, supervision with vc's for sequencing    Transfers Overall transfer level: Needs assistance Equipment used: Rolling walker (2 wheels) Transfers: Sit to/from Stand Sit to Stand: Supervision           General transfer comment: vc's for hand placement on chair arms. Stood without assist from chair and  toilet    Ambulation/Gait Ambulation/Gait assistance: Contact guard assist Gait Distance (Feet): 150 Feet Assistive device: Rolling walker (2 wheels) Gait Pattern/deviations: Step-to pattern, Decreased weight shift to right, Decreased stance time - right, Decreased step length - right, Trunk flexed Gait velocity: decreased Gait velocity interpretation: <1.8 ft/sec, indicate of risk for recurrent falls   General Gait Details: vc's for sequencing and beginning a step through pattern. vc's for posture as pt tends to maintain trunk flexion and reports she has done this for a long time.   Stairs Stairs: Yes Stairs assistance: Min assist Stair Management: One rail Left, Step to pattern, Forwards Number of Stairs: 2 General stair comments: practiced with L rail and R HHA. Pt will not do stairs initially though, she will take longer walk and enter lower level of home without stairs.   Wheelchair Mobility     Tilt Bed    Modified Rankin (Stroke Patients Only)       Balance Overall balance assessment: Needs assistance Sitting-balance support: No upper extremity supported, Feet supported Sitting balance-Leahy Scale: Good     Standing balance support: Bilateral upper extremity supported, During functional activity Standing balance-Leahy Scale: Poor Standing balance comment: reliant on BUE support                            Communication Communication Communication: No apparent difficulties  Cognition Arousal: Alert Behavior During Therapy: WFL for tasks assessed/performed   PT - Cognitive impairments:  No apparent impairments                       PT - Cognition Comments: anxious Following commands: Intact      Cueing Cueing Techniques: Verbal cues  Exercises Total Joint Exercises Ankle Circles/Pumps: AROM, Both, Seated, 10 reps Quad Sets: AROM, Both, 10 reps, Seated Heel Slides: AAROM, Right, 10 reps, Seated Hip ABduction/ADduction: AAROM, Right,  10 reps, Seated Straight Leg Raises: AAROM, Right, 10 reps, Seated Long Arc Quad: AROM, Right, 10 reps, Seated Knee Flexion: AROM, Right, 10 reps, Seated Goniometric ROM: 0-75    General Comments General comments (skin integrity, edema, etc.): Education given on proper use of CPM at home and zero knee foam as well as activity level. Pt became nauseous after lying down and then began vomiting, RN notified.      Pertinent Vitals/Pain Pain Assessment Pain Assessment: Faces Faces Pain Scale: Hurts little more Pain Location: R knee Pain Descriptors / Indicators: Operative site guarding, Grimacing Pain Intervention(s): Limited activity within patient's tolerance, Monitored during session, Premedicated before session    Home Living Family/patient expects to be discharged to:: Private residence Living Arrangements: Spouse/significant other Available Help at Discharge: Family Type of Home: House Home Access: Stairs to enter   Secretary/administrator of Steps: 1 Alternate Level Stairs-Number of Steps: level entry to bedroom from back door Home Layout: Multi-level Home Equipment: Agricultural consultant (2 wheels);Cane - single point;Lift chair;Other (comment);Toilet riser (stair lift)      Prior Function            PT Goals (current goals can now be found in the care plan section) Acute Rehab PT Goals Patient Stated Goal: to walk PT Goal Formulation: With patient Time For Goal Achievement: 07/16/23 Potential to Achieve Goals: Good Progress towards PT goals: Progressing toward goals    Frequency    7X/week      PT Plan      Co-evaluation              AM-PAC PT "6 Clicks" Mobility   Outcome Measure  Help needed turning from your back to your side while in a flat bed without using bedrails?: None Help needed moving from lying on your back to sitting on the side of a flat bed without using bedrails?: None Help needed moving to and from a bed to a chair (including a  wheelchair)?: A Little Help needed standing up from a chair using your arms (e.g., wheelchair or bedside chair)?: A Little Help needed to walk in hospital room?: Total Help needed climbing 3-5 steps with a railing? : Total 6 Click Score: 16    End of Session Equipment Utilized During Treatment: Gait belt Activity Tolerance: Patient tolerated treatment well Patient left: in bed;with call bell/phone within reach Nurse Communication: Mobility status PT Visit Diagnosis: Unsteadiness on feet (R26.81);Other abnormalities of gait and mobility (R26.89);Pain;Difficulty in walking, not elsewhere classified (R26.2) Pain - Right/Left: Right Pain - part of body: Knee     Time: 0928-1019 PT Time Calculation (min) (ACUTE ONLY): 51 min  Charges:    $Gait Training: 8-22 mins $Therapeutic Exercise: 8-22 mins $Therapeutic Activity: 8-22 mins PT General Charges $$ ACUTE PT VISIT: 1 Visit                     Amey Ka, PT  Acute Rehab Services Secure chat preferred Office [phone]    Deloris Fetters Doni Widmer 07/03/2023, 10:33 AM

## 2023-07-03 NOTE — Progress Notes (Signed)
 Patient alert and oriented, VSS, void and ambulate. Surgical site clean and dry no sign of infection. D/c instructions explain and given to the patient. Patient d/c home with 3 in 1 per order.

## 2023-07-03 NOTE — Discharge Summary (Signed)
 Patient ID: Marilyn [name] MRN: [id number] DOB/AGE: [birth year] 68 y.o.  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 denies having GERD. Unable to remove it.   History of anemia    History of bronchitis    History of sarcoidosis    Hyperlipidemia    no meds needed   Hypertension    Incisional breast wound    AT AGE 21 BILATERAL INCISION TO BREAST MADE   Leg swelling    Panic disorder    Personal history of radiation therapy    Pre-diabetes    S/P radiation therapy 08/17/11 - 09/28/11   LLQ - 50 Gy/25 Fractions with Boost of 10 Gy / 5 fractions   Sialoadenitis of submandibular gland 2016   Vitamin D  deficiency     Surgeries: Procedure(s): ARTHROPLASTY, KNEE, TOTAL on 07/02/2023   Consultants:   Discharged Condition: Improved  Hospital Course: Marilyn [name] is an 68 y.o. female who was admitted 07/02/2023 for operative treatment ofPrimary osteoarthritis of right knee. Patient has severe unremitting pain that affects sleep, daily activities, and work/hobbies. After pre-op clearance the patient was  taken to the operating room on 07/02/2023 and underwent  Procedure(s): ARTHROPLASTY, KNEE, TOTAL.    Patient was given perioperative antibiotics:  Anti-infectives (From admission, onward)    Start     Dose/Rate Route Frequency Ordered Stop   07/02/23 1445  ceFAZolin (ANCEF) IVPB 2g/100 mL premix        2 g 200 mL/hr over 30 Minutes Intravenous Every 6 hours 07/02/23 1436 07/02/23 2059   07/02/23 0920  vancomycin (VANCOCIN) powder  Status:  Discontinued          As needed 07/02/23 0920 07/02/23 1047   07/02/23 0700  ceFAZolin (ANCEF) IVPB 2g/100 mL premix        2 g 200 mL/hr over 30 Minutes Intravenous On call to O.R. 07/02/23 [id number] 07/02/23 0848        Patient was given sequential compression devices, early ambulation, and chemoprophylaxis to prevent DVT.  Patient benefited maximally from hospital stay and there were no complications.    Recent vital signs: Patient Vitals for the past 24 hrs:  BP Temp Temp src Pulse Resp SpO2  07/03/23 0345 (!) 107/58 98 F (36.7 C) Oral 91 18 98 %  07/03/23 0135 (!) 123/57 -- -- 73 -- 100 %  07/02/23 2247 (!) 104/43 98.4 F (36.9 C) Oral 75 18 95 %  07/02/23 1937 (!)  141/72 99.1 F (37.3 C) Oral 77 18 99 %  07/02/23 1430 -- -- -- 71 12 97 %  07/02/23 1415 137/77 -- -- 72 13 98 %  07/02/23 1400 (!) 147/70 -- -- 69 15 100 %  07/02/23 1345 (!) 146/72 -- -- 66 13 99 %  07/02/23 1330 (!) 149/81 -- -- 67 14 98 %  07/02/23 1315 (!) 143/75 -- -- 66 16 99 %  07/02/23 1300 131/71 -- -- 64 14 94 %  07/02/23 1245 (!) 150/75 -- -- (!) 57 17 99 %  07/02/23 1230 (!) 148/69 -- -- 64 12 94 %  07/02/23 1215 (!) 167/86 -- -- (!) 56 13 94 %  07/02/23 1200 (!) 145/89 -- -- (!) 51 12 97 %  07/02/23 1145 (!) 144/83 -- -- (!) 44 14 99 %  07/02/23 1130 (!) 148/76 -- -- 63 19 99 %  07/02/23 1115 (!) 144/78 -- -- 62 12 100 %  07/02/23 1100 131/79 -- -- 64 16 98 %  07/02/23 1050 (!) 97/54 (!) 97.5 F (36.4 C) -- 67 14 96 %     Recent laboratory studies: No  results for input(s): "WBC", "HGB", "HCT", "PLT", "NA", "K", "CL", "CO2", "BUN", "CREATININE", "GLUCOSE", "INR", "CALCIUM " in the last 72 hours.  Invalid input(s): "PT", "2"   Discharge Medications:   Allergies as of 07/03/2023       Reactions   Lexapro [escitalopram Oxalate]    Felt like zombie   Microzide  [hydrochlorothiazide ]    Palpitations   Penicillins Rash, Other (See Comments)   At injection site.        Medication List     STOP taking these medications    ALPRAZolam  1 MG tablet Commonly known as: XANAX    clobetasol  cream 0.05 % Commonly known as: TEMOVATE    diclofenac  Sodium 1 % Gel Commonly known as: Voltaren    triamcinolone  ointment 0.1 % Commonly known as: KENALOG        TAKE these medications    albuterol  108 (90 Base) MCG/ACT inhaler Commonly known as: VENTOLIN  HFA Inhale 2 puffs into the lungs every 6 (six) hours as needed for wheezing or shortness of breath.   Biofreeze 10 % Crea Generic drug: Menthol (Topical Analgesic) Apply 1 application  topically daily as needed (pain).   BLUE-EMU HEMP EX Apply 1 Application topically daily as needed (pain).   budesonide -formoterol  80-4.5 MCG/ACT inhaler Commonly known as: SYMBICORT  Inhale 2 puffs into the lungs 2 (two) times daily.   busPIRone  30 MG tablet Commonly known as: BUSPAR  TAKE 1/2 TO 1 (ONE-HALF TO ONE) TABLET BY MOUTH TWICE DAILY What changed: See the new instructions.   docusate sodium  100 MG capsule Commonly known as: Colace Take 1 capsule (100 mg total) by mouth daily as needed.   Eliquis 2.5 MG Tabs tablet Generic drug: apixaban Take 1 tablet (2.5 mg total) by mouth 2 (two) times daily. post-op to prevent blood clots   methocarbamol  750 MG tablet Commonly known as: Robaxin -750 Take 1 tablet (750 mg total) by mouth 3 (three) times daily as needed for muscle spasms.   ondansetron  4 MG tablet Commonly known as: Zofran  Take 1 tablet (4 mg total) by mouth every 8 (eight) hours as  needed for nausea or vomiting.   oxyCODONE -acetaminophen  5-325 MG tablet Commonly known as: Percocet Take 1-2 tablets by mouth every 6 (six) hours as needed. To be taken after surgery   rosuvastatin  10 MG tablet Commonly known as: CRESTOR  Take 1 tablet (10 mg  total) by mouth daily.   spironolactone  50 MG tablet Commonly known as: Aldactone  Take 1 tablet (50 mg total) by mouth daily.               Durable Medical Equipment  (From admission, onward)           Start     Ordered   07/02/23 1437  DME Walker rolling  Once       Question Answer Comment  Walker: With 5 Inch Wheels   Patient needs a walker to treat with the following condition Status post left partial knee replacement      07/02/23 1436   07/02/23 1437  DME 3 n 1  Once        07/02/23 1436   07/02/23 1437  DME Bedside commode  Once       Question:  Patient needs a bedside commode to treat with the following condition  Answer:  Status post left partial knee replacement   07/02/23 1436            Diagnostic Studies: DG Knee Right Port Result Date: 07/02/2023 CLINICAL DATA:  Postop. EXAM: PORTABLE RIGHT KNEE - 1-2 VIEW COMPARISON:  None Available. FINDINGS: Right knee arthroplasty in expected alignment. No periprosthetic lucency or fracture. There has been patellar resurfacing. Recent postsurgical change includes air and edema in the soft tissues and joint space. IMPRESSION: Right knee arthroplasty without immediate postoperative complication. Electronically Signed   By: Chadwick Colonel M.D.   On: 07/02/2023 11:55    Disposition: Discharge disposition: 01-Home or Self Care          Follow-up Information     Sandie Cross, PA-C. Schedule an appointment as soon as possible for a visit in 2 week(s).   Specialty: Orthopedic Surgery Contact information: [address] [place]  [place] [address] [phone]         [facility] Follow up.   Specialty: Home Health Services [facility] will contact you for the first home visit. Contact information: [address] [place] [phone] [phone]                  Signed: Sandie Cross 07/03/2023, 8:20 AM

## 2023-07-03 NOTE — Progress Notes (Signed)
 Physical Therapy Treatment Patient Details Name: Marilyn [name] MRN: [id number] DOB: 06/04/1955 Today's Date: 07/03/2023   History of Present Illness Pt is a 68 y.o. F who presents s/p R TKA 07/02/2023. Significant PMH: COPD.    PT Comments  Pt feeling much better in PM and husband present for education. Instructed in use of zero knee foam and to get in and out. Pt ambulating with more steady gait and better posture. Pt practiced 1 step bkwds with RW to simulate stepping up on her bedside stool. Pt performed supine there ex. Ready for d/c home with family.     If plan is discharge home, recommend the following: Assistance with cooking/housework;Assist for transportation;Help with stairs or ramp for entrance   Can travel by private vehicle        Equipment Recommendations  None recommended by PT (pt equipped)    Recommendations for Other Services       Precautions / Restrictions Precautions Precautions: Fall Recall of Precautions/Restrictions: Intact Required Braces or Orthoses:  (bone foam in bed) Restrictions Weight Bearing Restrictions Per Provider Order: Yes RLE Weight Bearing Per Provider Order: Weight bearing as tolerated     Mobility  Bed Mobility Overal bed mobility: Modified Independent Bed Mobility: Sit to Supine, Supine to Sit     Supine to sit: Modified independent (Device/Increase time) Sit to supine: Modified independent (Device/Increase time)   General bed mobility comments: mod I    Transfers Overall transfer level: Needs assistance Equipment used: Rolling walker (2 wheels) Transfers: Sit to/from Stand Sit to Stand: Modified independent (Device/Increase time)           General transfer comment: mod I from bed to RW    Ambulation/Gait Ambulation/Gait assistance: Supervision Gait Distance (Feet): 100 Feet Assistive device: Rolling walker (2 wheels) Gait Pattern/deviations: Decreased weight shift to right, Decreased stance time - right,  Decreased step length - right, Trunk flexed, Step-through pattern Gait velocity: decreased Gait velocity interpretation: 1.31 - 2.62 ft/sec, indicative of limited community ambulator   General Gait Details: pt feeling better, standing straighter during ambulation   Stairs Stairs: Yes Stairs assistance: Supervision Stair Management: Step to pattern, No rails, Backwards Number of Stairs: 1 General stair comments: bkwds to simulate stepping onto stool at bedside at home   Wheelchair Mobility     Tilt Bed    Modified Rankin (Stroke Patients Only)       Balance Overall balance assessment: Needs assistance Sitting-balance support: No upper extremity supported, Feet supported Sitting balance-Leahy Scale: Good     Standing balance support: Bilateral upper extremity supported, During functional activity Standing balance-Leahy Scale: Fair Standing balance comment: maintains static stance without UE support                            Communication Communication Communication: No apparent difficulties  Cognition Arousal: Alert Behavior During Therapy: WFL for tasks assessed/performed   PT - Cognitive impairments: No apparent impairments                       PT - Cognition Comments: anxious Following commands: Intact      Cueing Cueing Techniques: Verbal cues  Exercises Total Joint Exercises Ankle Circles/Pumps: AROM, Both, Seated, 10 reps Quad Sets: AROM, Both, 10 reps, Seated Heel Slides: Right, 10 reps, AROM, Supine Hip ABduction/ADduction: Right, 10 reps, AROM, Supine Straight Leg Raises: Right, 10 reps, AROM, Supine Long Arc Quad: AROM, Right, 10 reps,  Seated Knee Flexion: AROM, Right, 10 reps, Seated Goniometric ROM: 0-75    General Comments General comments (skin integrity, edema, etc.): instructed in use of zero knee foam. Husband present for education      Pertinent Vitals/Pain Pain Assessment Pain Assessment: Faces Faces Pain  Scale: Hurts little more Pain Location: R knee Pain Descriptors / Indicators: Operative site guarding, Grimacing Pain Intervention(s): Limited activity within patient's tolerance    Home Living                          Prior Function            PT Goals (current goals can now be found in the care plan section) Acute Rehab PT Goals Patient Stated Goal: to walk PT Goal Formulation: With patient Time For Goal Achievement: 07/16/23 Potential to Achieve Goals: Good Progress towards PT goals: Progressing toward goals    Frequency    7X/week      PT Plan      Co-evaluation              AM-PAC PT "6 Clicks" Mobility   Outcome Measure  Help needed turning from your back to your side while in a flat bed without using bedrails?: None Help needed moving from lying on your back to sitting on the side of a flat bed without using bedrails?: None Help needed moving to and from a bed to a chair (including a wheelchair)?: A Little Help needed standing up from a chair using your arms (e.g., wheelchair or bedside chair)?: A Little Help needed to walk in hospital room?: A Little Help needed climbing 3-5 steps with a railing? : A Little 6 Click Score: 20    End of Session Equipment Utilized During Treatment: Gait belt Activity Tolerance: Patient tolerated treatment well Patient left: in bed;with call bell/phone within reach;with family/visitor present Nurse Communication: Mobility status PT Visit Diagnosis: Unsteadiness on feet (R26.81);Other abnormalities of gait and mobility (R26.89);Pain;Difficulty in walking, not elsewhere classified (R26.2) Pain - Right/Left: Right Pain - part of body: Knee     Time: 1610-9604 PT Time Calculation (min) (ACUTE ONLY): 31 min  Charges:    $Gait Training: 8-22 mins $Therapeutic Exercise: 8-22 mins $Therapeutic Activity: 8-22 mins PT General Charges $$ ACUTE PT VISIT: 1 Visit                     Amey Ka, PT  Acute  Rehab Services Secure chat preferred Office [phone]    Deloris Fetters Selen Smucker 07/03/2023, 1:58 PM

## 2023-07-03 NOTE — Evaluation (Signed)
 Occupational Therapy Evaluation Patient Details Name: Marilyn [name] MRN: [id number] DOB: 1955-10-25 Today's Date: 07/03/2023   History of Present Illness   Pt is a 68 y.o. F who presents s/p R TKA 07/02/2023. Significant PMH: COPD.     Clinical Impressions PTA pt independent. Admitted for above and presents with problem list below.  Pt educated on compensatory techniques for ADLs with min assist for LB, supervision for transfers and mobility using RW.  Pt will have support of spouse at dc initially as needed.  Anticipate she will progress well.  Needing min assist for shower transfers at this time, but plans to basin bathe initially.  Will follow acutely with no further OT recommendations after dc home.      If plan is discharge home, recommend the following:   A little help with walking and/or transfers;A little help with bathing/dressing/bathroom;Assistance with cooking/housework;Assist for transportation;Help with stairs or ramp for entrance     Functional Status Assessment   Patient has had a recent decline in their functional status and demonstrates the ability to make significant improvements in function in a reasonable and predictable amount of time.     Equipment Recommendations   BSC/3in1     Recommendations for Other Services         Precautions/Restrictions   Precautions Precautions: Fall Recall of Precautions/Restrictions: Intact Required Braces or Orthoses:  (bone foam in bed) Restrictions Weight Bearing Restrictions Per Provider Order: Yes RLE Weight Bearing Per Provider Order: Weight bearing as tolerated     Mobility Bed Mobility Overal bed mobility: Needs Assistance Bed Mobility: Supine to Sit     Supine to sit: Supervision     General bed mobility comments: no assist required, increased time and HOB slightly elevated.    Transfers Overall transfer level: Needs assistance Equipment used: Rolling walker (2 wheels) Transfers: Sit  to/from Stand Sit to Stand: Supervision, From elevated surface           General transfer comment: elevated EOB, cueing for hand placement.  increased ease when pushing from Bil hands on recliner.  cueing for technique      Balance Overall balance assessment: Needs assistance Sitting-balance support: No upper extremity supported, Feet supported Sitting balance-Leahy Scale: Good     Standing balance support: Bilateral upper extremity supported, During functional activity Standing balance-Leahy Scale: Poor Standing balance comment: reliant on BUE support                           ADL either performed or assessed with clinical judgement   ADL Overall ADL's : Needs assistance/impaired     Grooming: Supervision/safety;Standing           Upper Body Dressing : Sitting;Set up   Lower Body Dressing: Minimal assistance;Sit to/from stand Lower Body Dressing Details (indicate cue type and reason): reviewed compensatory techniques and will have spouse will assist initally for socks but plans to wear slip on shoes Toilet Transfer: Supervision/safety;Ambulation;Rolling walker (2 wheels)       Tub/ Shower Transfer: Minimal assistance;Walk-in shower;Ambulation;Rolling walker (2 wheels) Tub/Shower Transfer Details (indicate cue type and reason): reviewed reverse step technique with min assist to stabilize when stepping with L LE.  Plans to basin bathe initally. Functional mobility during ADLs: Rolling walker (2 wheels);Supervision/safety       Vision   Vision Assessment?: No apparent visual deficits     Perception         Praxis  Pertinent Vitals/Pain Pain Assessment Pain Assessment: Faces Faces Pain Scale: Hurts a little bit Pain Location: R knee Pain Descriptors / Indicators: Operative site guarding, Grimacing Pain Intervention(s): Limited activity within patient's tolerance, Monitored during session, Repositioned     Extremity/Trunk Assessment  Upper Extremity Assessment Upper Extremity Assessment: Overall WFL for tasks assessed   Lower Extremity Assessment Lower Extremity Assessment: Defer to PT evaluation (R TKA)   Cervical / Trunk Assessment Cervical / Trunk Assessment: Normal   Communication Communication Communication: No apparent difficulties   Cognition Arousal: Alert Behavior During Therapy: WFL for tasks assessed/performed Cognition: No apparent impairments                               Following commands: Intact       Cueing  General Comments   Cueing Techniques: Verbal cues      Exercises     Shoulder Instructions      Home Living Family/patient expects to be discharged to:: Private residence Living Arrangements: Spouse/significant other Available Help at Discharge: Family Type of Home: House Home Access: Stairs to enter Secretary/administrator of Steps: 1   Home Layout: Multi-level Alternate Level Stairs-Number of Steps: level entry to bedroom from back door   Bathroom Shower/Tub: Producer, television/film/video: Standard (with toilet riser)     Home Equipment: Agricultural consultant (2 wheels);Cane - single point;Lift chair;Other (comment);Toilet riser (stair lift)          Prior Functioning/Environment Prior Level of Function : Independent/Modified Independent             Mobility Comments: Retired from post office      OT Problem List: Decreased strength;Decreased activity tolerance;Impaired balance (sitting and/or standing);Pain;Decreased knowledge of precautions;Decreased knowledge of use of DME or AE   OT Treatment/Interventions: Self-care/ADL training;DME and/or AE instruction;Balance training;Patient/family education;Therapeutic activities      OT Goals(Current goals can be found in the care plan section)   Acute Rehab OT Goals Patient Stated Goal: get better OT Goal Formulation: With patient Time For Goal Achievement: 07/17/23 Potential to Achieve Goals:  Good   OT Frequency:  Min 2X/week    Co-evaluation              AM-PAC OT "6 Clicks" Daily Activity     Outcome Measure Help from another person eating meals?: None Help from another person taking care of personal grooming?: A Little Help from another person toileting, which includes using toliet, bedpan, or urinal?: A Little Help from another person bathing (including washing, rinsing, drying)?: A Little Help from another person to put on and taking off regular upper body clothing?: A Little Help from another person to put on and taking off regular lower body clothing?: A Little 6 Click Score: 19   End of Session Equipment Utilized During Treatment: Rolling walker (2 wheels) Nurse Communication: Mobility status  Activity Tolerance: Patient tolerated treatment well Patient left: in chair;with call bell/phone within reach  OT Visit Diagnosis: Other abnormalities of gait and mobility (R26.89);Pain Pain - Right/Left: Right Pain - part of body: Knee                Time: 1610-9604 OT Time Calculation (min): 25 min Charges:  OT General Charges $OT Visit: 1 Visit OT Evaluation $OT Eval Moderate Complexity: 1 Mod OT Treatments $Self Care/Home Management : 8-22 mins  Bary Boss, OT Acute Rehabilitation Services Office [phone] Secure Chat Preferred  Fredrich Jefferson 07/03/2023, 10:04 AM

## 2023-07-03 NOTE — TOC Transition Note (Signed)
 Transition of Care [facility]) - Discharge Note   Patient Details  Name: Marilyn [name] MRN: [id number] Date of Birth: November 19, 1955  Transition of Care [facility]) CM/SW Contact:  Tom-Johnson, Angelique Ken, RN Phone Number: 07/03/2023, 9:37 AM   Clinical Narrative:     Patient is scheduled for discharge today.  Home health info, hospital f/u and discharge instructions on AVS. Prescriptions sent to [facility] pharmacy and patient will receive meds prior discharge. Unit to provide necessary DME's at discharge. Husband, Dean Every to transport at discharge.  No further TOC needs noted.       Final next level of care: Home w Home Health Services Barriers to Discharge: Barriers Resolved   Patient Goals and CMS Choice Patient states their goals for this hospitalization and ongoing recovery are:: To return home CMS Medicare.gov Compare Post Acute Care list provided to:: Patient Choice offered to / list presented to : Patient      Discharge Placement                Patient to be transferred to facility by: Husband Name of family member notified: Dean Every    Discharge Plan and Services Additional resources added to the After Visit Summary for                    DME Agency: NA       HH Arranged: OT, PT HH Agency: [facility]        Social Drivers of Health (SDOH) Interventions SDOH Screenings   Food Insecurity: No Food Insecurity (08/07/2022)  Housing: Low Risk  (08/07/2022)  Transportation Needs: No Transportation Needs (08/07/2022)  Alcohol Screen: Low Risk  (08/07/2022)  Depression (PHQ2-9): Medium Risk (02/08/2023)  Financial Resource Strain: Low Risk  (08/07/2022)  Physical Activity: Unknown (08/07/2022)  Social Connections: Socially Integrated (08/07/2022)  Stress: No Stress Concern Present (08/07/2022)  Tobacco Use: Medium Risk (07/02/2023)     Readmission Risk Interventions     No data to display

## 2023-07-09 ENCOUNTER — Ambulatory Visit (INDEPENDENT_AMBULATORY_CARE_PROVIDER_SITE_OTHER): Admitting: Family Medicine

## 2023-07-10 ENCOUNTER — Telehealth: Payer: Self-pay | Admitting: Orthopaedic Surgery

## 2023-07-10 NOTE — Telephone Encounter (Signed)
 Marsha called. She is the therapist working in home with patient. Says the patient's blood pressure was high today. 170/80. Reporting she had to stop for today. Her call cb# [phone]

## 2023-07-11 NOTE — Telephone Encounter (Signed)
 Spoke with patient. She will continue to check it at home and will contact her heart doctor who recently changed her BP medicine. She mentioned that she has had to stop taking the oxycodone  due to hallucinations. She has had hydrocodone  in the past for another surgery. Would it be possible to send in hydrocodone  to [facility] on [address]?

## 2023-07-11 NOTE — Telephone Encounter (Signed)
 Best for home health/patient to contact pcp with blood pressure issues

## 2023-07-12 ENCOUNTER — Other Ambulatory Visit: Payer: Self-pay | Admitting: Physician Assistant

## 2023-07-12 MED ORDER — HYDROCODONE-ACETAMINOPHEN 5-325 MG PO TABS: 1.0000 | ORAL_TABLET | Freq: Four times a day (QID) | ORAL | 0 refills | Status: DC | PRN

## 2023-07-12 NOTE — Telephone Encounter (Signed)
 Sounds good.  Sent in norco

## 2023-07-12 NOTE — Telephone Encounter (Signed)
 Called patient. She is aware about the hydrocodone . She also states that she has been checking her BP, and it has been normal.

## 2023-07-17 ENCOUNTER — Ambulatory Visit (INDEPENDENT_AMBULATORY_CARE_PROVIDER_SITE_OTHER): Admitting: Physician Assistant

## 2023-07-17 ENCOUNTER — Encounter: Payer: Self-pay | Admitting: Physician Assistant

## 2023-07-17 DIAGNOSIS — Z96651 Presence of right artificial knee joint: Secondary | ICD-10-CM

## 2023-07-17 NOTE — Progress Notes (Signed)
 Post-Op Visit Note   Patient: Marilyn [name]           Date of Birth: [birth year]           MRN: [id number] Visit Date: 07/17/2023 PCP: Genia Kettering, MD   Assessment & Plan:  Chief Complaint:  Chief Complaint  Patient presents with   Right Knee - Follow-up    Right total knee arthroplasty 07/02/2023   Visit Diagnoses:  1. Status post total right knee replacement     Plan: Patient is a pleasant 68 year old female who comes in today 2 weeks status post right total knee replacement 07/02/2023.  She has been doing well in regards to the knee.  She has been taking primarily Robaxin .  She stopped taking the narcotic pain meds over a week ago.  She has been compliant taking Eliquis  for DVT prophylaxis.  She has been getting home health physical therapy and is ambulating with a walker today.  Examination of the right knee reveals a well-healed surgical incision with nylon sutures in place.  No evidence of infection or cellulitis.  Calf is soft nontender.  She is neurovascularly intact distally.  Today, sutures were removed and Steri-Strips applied.  She will continue with the Eliquis  and until she is 4 weeks postop and then transition to baby aspirin twice daily for 2 weeks.  Postoperative instructions provided.  I have provided her with a prescription for dog for compression socks as she still has some swelling to the right lower extremity.  Outpatient referral for PT has been made.  Follow-up in 4 weeks for repeat evaluation and 2 view x-rays of the right knee.  Follow-Up Instructions: Return in about 4 weeks (around 08/14/2023).   Orders:  No orders of the defined types were placed in this encounter.  No orders of the defined types were placed in this encounter.   Imaging: No new imaging  PMFS History: Patient Active Problem List   Diagnosis Date Noted   Status post total right knee replacement 07/02/2023   Primary osteoarthritis of right knee 07/01/2023    Hypercholesterolemia 05/10/2022   Trochanteric bursitis of right hip 05/10/2022   Preop exam for internal medicine 02/09/2022   Plantar flexed metatarsal bone of right foot 01/11/2022   Grief 09/28/2021   Leg swelling 04/18/2021   Incisional breast wound 04/18/2021   History of sarcoidosis 04/18/2021   History of bronchitis 04/18/2021   GERD (gastroesophageal reflux disease) 04/18/2021   Family history of prostate cancer 04/18/2021   Depression 04/18/2021   Complication of anesthesia 04/18/2021   Colon polyp 04/18/2021   Cigarette smoker 04/18/2021   Cancer (HCC) 04/18/2021   Coronary atherosclerosis 01/30/2021   Aortic atherosclerosis (HCC) 01/30/2021   Primary hypertension 01/14/2021   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   Knee pain, chronic 08/28/2019   Elevated BP without diagnosis of hypertension 08/28/2019   Callus of foot 08/28/2019   Cholelithiasis 12/26/2018   Genetic testing 12/26/2017   Family history of breast cancer    Family history of pancreatic cancer    Family history of colon cancer    Abdominal pain 07/28/2016   Morbid obesity (HCC) 03/13/2016   Dyspnea 11/09/2015   Rash and nonspecific skin eruption 07/27/2015   Well adult exam 07/23/2014   Sialoadenitis 06/02/2014   Edema 07/07/2013   AB (asthmatic bronchitis) 06/23/2013   Anxiety    Malignant neoplasm of upper-outer quadrant of left breast in female, 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   Allergy 06/06/2011  COLONIC POLYPS 01/25/2010   DIVERTICULOSIS OF COLON 01/25/2010   DEGENERATIVE JOINT DISEASE 11/06/2008   PANIC DISORDER 07/23/2008   Sarcoidosis 10/13/2007   Vitamin D  deficiency 10/13/2007   ANEMIA 10/13/2007   BRONCHITIS 10/13/2007   Dyslipidemia 04/23/2007   Adjustment disorder with mixed anxiety and depressed mood 04/23/2007   Venous (peripheral) insufficiency 04/23/2007   HEADACHE 04/23/2007   Past Medical History:  Diagnosis Date   Allergy    Anemia    hx   Anxiety     panic disorder   Breast cancer (HCC) 06/06/2011   bc  left breast 3 o'clock dx=invasive ductal ca uoqER/PR=positive   Cancer (HCC)    BREAST - left   Cigarette smoker    Colon polyp    Complication of anesthesia    DIFFICULTY AWAKENING, BP INCREASE AFTER CHOLECYSTECTEMY    COPD (chronic obstructive pulmonary disease) (HCC)    Depression    Diverticulosis of colon    DJD (degenerative joint disease)    Family history of breast cancer    Family history of colon cancer    Family history of pancreatic cancer    Family history of prostate cancer    Gallstones    GERD (gastroesophageal reflux disease)    Pt. denies having GERD. Unable to remove it.   History of anemia    History of bronchitis    History of sarcoidosis    Hyperlipidemia    no meds needed   Hypertension    Incisional breast wound    AT AGE 67 BILATERAL INCISION TO BREAST MADE   Leg swelling    Panic disorder    Personal history of radiation therapy    Pre-diabetes    S/P radiation therapy 08/17/11 - 09/28/11   LLQ - 50 Gy/25 Fractions with Boost of 10 Gy / 5 fractions   Sialoadenitis of submandibular gland 2016   Vitamin D  deficiency     Family History  Problem Relation Age of Onset   Dementia Mother    Hypertension Mother    Anemia Mother    Other Mother        + H Pylori   Seizures Mother    Hypertension Father    Prostate cancer Father        dx over 47   Colon cancer Father 35       diagnosed 2009   Bladder Cancer Father        dx over 39   Colon polyps Father    Hypertension Sister    Breast cancer Sister        dx in her 30s; mat half sister   Pancreatic cancer Sister 65       d. 24   Lung cancer Sister    Hypertension Sister    Sarcoidosis Sister        mat 1/2 sister   Multiple sclerosis Brother        #2   Hypertension Brother        #1   Hyperlipidemia Brother    Prostate cancer Brother        dx under 50   Dementia Maternal Aunt    Dementia Maternal Aunt    Seizures Maternal  Uncle    Lung cancer Maternal Uncle    Lung cancer Paternal Aunt    Breast cancer Cousin        pat first cousin d. 85   Sarcoidosis Cousin        mat first cousin  Breast cancer Cousin        maternal 2nd cousins - distant   Brain cancer Other        benign   Esophageal cancer Neg Hx    Stomach cancer Neg Hx    Rectal cancer Neg Hx     Past Surgical History:  Procedure Laterality Date   BIOPSY BREAST     left breast  as a teenager  benign   BREAST BIOPSY Left 06/06/2011   BREAST EXCISIONAL BIOPSY Bilateral    BREAST LUMPECTOMY Left 2013   BREAST SURGERY  07/05/11   left breast lumpectomy with needle loc & axillary sln bx   BUNIONECTOMY  2011   bilateral by Dr Arnetha Langton   CHOLECYSTECTOMY  02/2019   COLONOSCOPY     polyp   cyst removed from right hand  1995   POLYPECTOMY     TONSILLECTOMY  1972   TOTAL ABDOMINAL HYSTERECTOMY  2000   Dr Andree Kayser   TOTAL KNEE ARTHROPLASTY Right 07/02/2023   Procedure: ARTHROPLASTY, KNEE, TOTAL;  Surgeon: Wes Hamman, MD;  Location: [facility] OR;  Service: Orthopedics;  Laterality: Right;   TRIGGER FINGER RELEASE  2008   Dr Donzella Galley   Social History   Occupational History   Occupation: retired  Tobacco Use   Smoking status: Former    Current packs/day: 0.00    Types: Cigarettes    Quit date: 12/28/2020    Years since quitting: 2.5   Smokeless tobacco: Never  Vaping Use   Vaping status: Never Used  Substance and Sexual Activity   Alcohol use: Yes    Alcohol/week: 0.0 - 1.0 standard drinks of alcohol    Comment: social   Drug use: No   Sexual activity: Not Currently    Birth control/protection: Surgical    Comment: menses age 89,hrt started  2000

## 2023-07-20 ENCOUNTER — Ambulatory Visit (INDEPENDENT_AMBULATORY_CARE_PROVIDER_SITE_OTHER): Admitting: Physical Therapy

## 2023-07-20 ENCOUNTER — Other Ambulatory Visit: Payer: Self-pay

## 2023-07-20 ENCOUNTER — Encounter: Payer: Self-pay | Admitting: Physical Therapy

## 2023-07-20 DIAGNOSIS — M6281 Muscle weakness (generalized): Secondary | ICD-10-CM | POA: Diagnosis not present

## 2023-07-20 DIAGNOSIS — M25661 Stiffness of right knee, not elsewhere classified: Secondary | ICD-10-CM

## 2023-07-20 DIAGNOSIS — R262 Difficulty in walking, not elsewhere classified: Secondary | ICD-10-CM

## 2023-07-20 DIAGNOSIS — M25561 Pain in right knee: Secondary | ICD-10-CM

## 2023-07-20 DIAGNOSIS — R600 Localized edema: Secondary | ICD-10-CM

## 2023-07-20 NOTE — Therapy (Signed)
 OUTPATIENT PHYSICAL THERAPY LOWER EXTREMITY EVALUATION   Patient Name: Marilyn [name] MRN: [id number] DOB:[birth year], 68 y.o., female Today's Date: 07/20/2023  END OF SESSION:  PT End of Session - 07/20/23 1106     Visit Number 1    Number of Visits 17    Date for PT Re-Evaluation 09/14/23    Authorization Type [employer]    Authorization Time Period 07/20/23 to 09/14/23    Progress Note Due on Visit 60    PT Start Time 1104    PT Stop Time 1143    PT Time Calculation (min) 39 min    Activity Tolerance Patient tolerated treatment well    Behavior During Therapy [facility] for tasks assessed/performed             Past Medical History:  Diagnosis Date   Allergy    Anemia    hx   Anxiety    panic disorder   Breast cancer (HCC) 06/06/2011   bc  left breast 3 o'clock dx=invasive ductal ca uoqER/PR=positive   Cancer (HCC)    BREAST - left   Cigarette smoker    Colon polyp    Complication of anesthesia    DIFFICULTY AWAKENING, BP INCREASE AFTER CHOLECYSTECTEMY    COPD (chronic obstructive pulmonary disease) (HCC)    Depression    Diverticulosis of colon    DJD (degenerative joint disease)    Family history of breast cancer    Family history of colon cancer    Family history of pancreatic cancer    Family history of prostate cancer    Gallstones    GERD (gastroesophageal reflux disease)    Pt. denies having GERD. Unable to remove it.   History of anemia    History of bronchitis    History of sarcoidosis    Hyperlipidemia    no meds needed   Hypertension    Incisional breast wound    AT AGE 61 BILATERAL INCISION TO BREAST MADE   Leg swelling    Panic disorder    Personal history of radiation therapy    Pre-diabetes    S/P radiation therapy 08/17/11 - 09/28/11   LLQ - 50 Gy/25 Fractions with Boost of 10 Gy / 5 fractions   Sialoadenitis of submandibular gland 2016   Vitamin D  deficiency    Past Surgical History:  Procedure Laterality Date   BIOPSY BREAST      left breast  as a teenager  benign   BREAST BIOPSY Left 06/06/2011   BREAST EXCISIONAL BIOPSY Bilateral    BREAST LUMPECTOMY Left 2013   BREAST SURGERY  07/05/11   left breast lumpectomy with needle loc & axillary sln bx   BUNIONECTOMY  2011   bilateral by Dr Arnetha Langton   CHOLECYSTECTOMY  02/2019   COLONOSCOPY     polyp   cyst removed from right hand  1995   POLYPECTOMY     TONSILLECTOMY  1972   TOTAL ABDOMINAL HYSTERECTOMY  2000   Dr Andree Kayser   TOTAL KNEE ARTHROPLASTY Right 07/02/2023   Procedure: ARTHROPLASTY, KNEE, TOTAL;  Surgeon: Wes Hamman, MD;  Location: [facility] OR;  Service: Orthopedics;  Laterality: Right;   TRIGGER FINGER RELEASE  2008   Dr Donzella Galley   Patient Active Problem List   Diagnosis Date Noted   Status post total right knee replacement 07/02/2023   Primary osteoarthritis of right knee 07/01/2023   Hypercholesterolemia 05/10/2022   Trochanteric bursitis of right hip 05/10/2022   Preop exam for internal  medicine 02/09/2022   Plantar flexed metatarsal bone of right foot 01/11/2022   Grief 09/28/2021   Leg swelling 04/18/2021   Incisional breast wound 04/18/2021   History of sarcoidosis 04/18/2021   History of bronchitis 04/18/2021   GERD (gastroesophageal reflux disease) 04/18/2021   Family history of prostate cancer 04/18/2021   Depression 04/18/2021   Complication of anesthesia 04/18/2021   Colon polyp 04/18/2021   Cigarette smoker 04/18/2021   Cancer (HCC) 04/18/2021   Coronary atherosclerosis 01/30/2021   Aortic atherosclerosis (HCC) 01/30/2021   Primary hypertension 01/14/2021   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   Knee pain, chronic 08/28/2019   Elevated BP without diagnosis of hypertension 08/28/2019   Callus of foot 08/28/2019   Cholelithiasis 12/26/2018   Genetic testing 12/26/2017   Family history of breast cancer    Family history of pancreatic cancer    Family history of colon cancer    Abdominal pain 07/28/2016   Morbid obesity (HCC) 03/13/2016    Dyspnea 11/09/2015   Rash and nonspecific skin eruption 07/27/2015   Well adult exam 07/23/2014   Sialoadenitis 06/02/2014   Edema 07/07/2013   AB (asthmatic bronchitis) 06/23/2013   Anxiety    Malignant neoplasm of upper-outer quadrant of left breast in female, 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   Allergy 06/06/2011   COLONIC POLYPS 01/25/2010   DIVERTICULOSIS OF COLON 01/25/2010   DEGENERATIVE JOINT DISEASE 11/06/2008   PANIC DISORDER 07/23/2008   Sarcoidosis 10/13/2007   Vitamin D  deficiency 10/13/2007   ANEMIA 10/13/2007   BRONCHITIS 10/13/2007   Dyslipidemia 04/23/2007   Adjustment disorder with mixed anxiety and depressed mood 04/23/2007   Venous (peripheral) insufficiency 04/23/2007   HEADACHE 04/23/2007    PCP: Adelaide Holy MD   REFERRING PROVIDER: Sandie Cross, PA-C  REFERRING DIAG: Diagnosis [phone] (ICD-10-CM) - Status post total right knee replacement  THERAPY DIAG:  Stiffness of right knee, not elsewhere classified  Difficulty in walking, not elsewhere classified  Muscle weakness (generalized)  Localized edema  Acute pain of right knee  Rationale for Evaluation and Treatment: Rehabilitation  ONSET DATE: Surgery 07/02/23  SUBJECTIVE:   SUBJECTIVE STATEMENT:  Had my surgery on May 5th, also had HHPT which discharged last Friday. Still have the CPM but someone is coming to get it soon. Knee feels stiff. I need to get better with icing, still swollen. Working on getting grab bars in my shower, I do have a tub bench to use. There are steps in the house but I have a stair lift, this morning I was able to climb the steps. I think I may be allergic to the muscle relaxer, had a lot of panic attacks. Would be able to exercise down the line to help me lose weight   PERTINENT HISTORY: See above  PAIN:  Are you having pain? Yes: NPRS scale: 2/10 Pain location: surgical knee  Pain description: throbbing  Aggravating factors: bending the knee   Relieving factors: ice, elevation, not bending knee   PRECAUTIONS: Fall  RED FLAGS: None   WEIGHT BEARING RESTRICTIONS: No  FALLS:  Has patient fallen in last 6 months? No, no close calls, no FOF   LIVING ENVIRONMENT: Lives with: lives with their spouse Lives in: House/apartment Stairs: 5 STE, has steep flight of steps inside home but stair lift available for use  Has following equipment at home: Otho Blitz - 2 wheeled, Tour manager, and stair lift, cane   OCCUPATION: retiredHydrographic surveyor at [employer]   PLOF: Independent, Independent with basic  ADLs, Independent with gait, and Independent with transfers  PATIENT GOALS: be able to move without pain, get more mobile   NEXT MD VISIT: Referring 08/15/23  OBJECTIVE:  Note: Objective measures were completed at Evaluation unless otherwise noted.    PATIENT SURVEYS:   Patient-Specific Activity Scoring Scheme  "0" represents "unable to perform." "10" represents "able to perform at prior level. 0 1 2 3 4 5 6 7 8 9  10 (Date and Score)   Activity Eval     1. Getting up from chairs  5      2. Picking something up  3    3. Getting comfortable in bed  1   4.    5.    Score 3    Total score = sum of the activity scores/number of activities Minimum detectable change (90%CI) for average score = 2 points Minimum detectable change (90%CI) for single activity score = 3 points     COGNITION: Overall cognitive status: Within functional limits for tasks assessed     SENSATION: Not tested  EDEMA:   Appropriate for post-op condition     LOWER EXTREMITY ROM:  Active ROM Right eval Left eval  Hip flexion    Hip extension    Hip abduction    Hip adduction    Hip internal rotation    Hip external rotation    Knee flexion Supine heel slide 52*, HS curl in sitting 72*   Knee extension 9* supine with heel prop    Ankle dorsiflexion    Ankle plantarflexion    Ankle inversion    Ankle eversion     (Blank rows = not  tested)  LOWER EXTREMITY MMT:  MMT Right eval Left eval  Hip flexion    Hip extension    Hip abduction    Hip adduction    Hip internal rotation    Hip external rotation    Knee flexion 3- 4+  Knee extension 4- 4+  Ankle dorsiflexion    Ankle plantarflexion    Ankle inversion    Ankle eversion     (Blank rows = not tested)    FUNCTIONAL TESTS:  Timed up and go (TUG): 29.7 seconds RW 2 3 minute walk test: 247ft, RW   GAIT: Distance walked: 234ft Assistive device utilized: Environmental consultant - 2 wheeled Level of assistance: Modified independence Comments: limited heel toe pattern, antalgic gait, tends to lean heavily on RW                                                                                                                                 TREATMENT DATE:   07/20/23   Exam, HEP, POC   Nustep seat 11 L1 x8 minutes all four extremities with holds into knee flexion and extension  Education on POC, general progress and focus of PT in context of TKR, encouraged regular bouts of walking at home (up 5 minutes every 30  or 10 minutes every hour total), continue with HHPT HEP and we will progress it next visit   PATIENT EDUCATION:  Education details: exam findings, POC, HEP  Person educated: Patient Education method: Explanation, Demonstration, and Handouts Education comprehension: verbalized understanding, returned demonstration, and needs further education  HOME EXERCISE PROGRAM:  2nd session- has HHPT HEP   ASSESSMENT:  CLINICAL IMPRESSION: Patient is a 68 y.o. F who was seen today for physical therapy evaluation and treatment for Diagnosis Z96.651 (ICD-10-CM) - Status post total right knee replacement. All findings as expected and without complication post-op. Anticipate she will do well with skilled PT services moving forward.    OBJECTIVE IMPAIRMENTS: Abnormal gait, decreased activity tolerance, decreased balance, decreased knowledge of use of DME, decreased  mobility, difficulty walking, decreased ROM, decreased strength, hypomobility, increased fascial restrictions, impaired flexibility, and pain.   ACTIVITY LIMITATIONS: sitting, standing, squatting, sleeping, stairs, transfers, bed mobility, and locomotion level  PARTICIPATION LIMITATIONS: meal prep, cleaning, laundry, driving, shopping, community activity, yard work, and church  PERSONAL FACTORS: Age, Behavior pattern, Education, Fitness, Past/current experiences, Profession, Sex, Social background, and Time since onset of injury/illness/exacerbation are also affecting patient's functional outcome.   REHAB POTENTIAL: Good  CLINICAL DECISION MAKING: Stable/uncomplicated  EVALUATION COMPLEXITY: Low   GOALS: Goals reviewed with patient? No  SHORT TERM GOALS: Target date: 08/17/2023    Will be compliant with appropriate progressive HEP Goal status: initial    2. R knee AROM flexion to be at least and extension AROM to be at least 95* Goal status: initial   3. Will be independent with edema management strategies  Goal status: initial    4. Gait pattern to have normalized with LRAD  Goal status: initial   5. Will complete TUG test in 15 seconds or less with LRAD to show improved functional balance  Goal status: initial     LONG TERM GOALS: Target date: 09/14/2023    MMT to have improved by one grade all weak groups Goal status: initial   2. Knee AROM to be WNL and pain free Goal status: initial    3. Will be able to ascend and descend stairs reciprocally with no increase in pain Goal status: initial   4. Will be able to ambulate community distances and perform all household tasks with no increase in pain  Goal status: initial   5. PSFS to have improved by at least 3 points to show improved QOL and subjective improvement  Goal status: initial    PLAN:  PT FREQUENCY: 2x/week  PT DURATION: 8 weeks  PLANNED INTERVENTIONS: 97750- Physical Performance Testing,  97110-Therapeutic exercises, 97530- Therapeutic activity, 97112- Neuromuscular re-education, 97535- Self Care, 16109- Manual therapy, 97116- Gait training, [phone]- Vasopneumatic device, Balance training, Stair training, Taping, Dry Needling, and DME instructions  PLAN FOR NEXT SESSION: ROM and strength progressions as appropriate, gait training, manual as desired, edema management, she will bring HHPT HEP 2nd visit so we can see and progress it   Terrel Ferries, PT, DPT 07/20/23 11:54 AM

## 2023-07-24 ENCOUNTER — Ambulatory Visit (INDEPENDENT_AMBULATORY_CARE_PROVIDER_SITE_OTHER): Admitting: Family Medicine

## 2023-07-24 ENCOUNTER — Ambulatory Visit (INDEPENDENT_AMBULATORY_CARE_PROVIDER_SITE_OTHER): Admitting: Physical Therapy

## 2023-07-24 ENCOUNTER — Encounter: Payer: Self-pay | Admitting: Physical Therapy

## 2023-07-24 DIAGNOSIS — R600 Localized edema: Secondary | ICD-10-CM

## 2023-07-24 DIAGNOSIS — M25551 Pain in right hip: Secondary | ICD-10-CM

## 2023-07-24 DIAGNOSIS — R262 Difficulty in walking, not elsewhere classified: Secondary | ICD-10-CM

## 2023-07-24 DIAGNOSIS — M25562 Pain in left knee: Secondary | ICD-10-CM

## 2023-07-24 DIAGNOSIS — M25661 Stiffness of right knee, not elsewhere classified: Secondary | ICD-10-CM

## 2023-07-24 DIAGNOSIS — G8929 Other chronic pain: Secondary | ICD-10-CM

## 2023-07-24 DIAGNOSIS — M6281 Muscle weakness (generalized): Secondary | ICD-10-CM

## 2023-07-24 DIAGNOSIS — M25561 Pain in right knee: Secondary | ICD-10-CM

## 2023-07-24 NOTE — Therapy (Signed)
 OUTPATIENT PHYSICAL THERAPY LOWER EXTREMITY TREATMENT   Patient Name: Marilyn [name] MRN: [id number] DOB:[birth year], 68 y.o., female Today's Date: 07/24/2023  END OF SESSION:  PT End of Session - 07/24/23 1104     Visit Number 2    Number of Visits 17    Date for PT Re-Evaluation 09/14/23    Authorization Type [employer]    Authorization Time Period 07/20/23 to 09/14/23    Progress Note Due on Visit 60    PT Start Time 1104    PT Stop Time 1143    PT Time Calculation (min) 39 min    Activity Tolerance Patient tolerated treatment well    Behavior During Therapy [facility] for tasks assessed/performed              Past Medical History:  Diagnosis Date   Allergy    Anemia    hx   Anxiety    panic disorder   Breast cancer (HCC) 06/06/2011   bc  left breast 3 o'clock dx=invasive ductal ca uoqER/PR=positive   Cancer (HCC)    BREAST - left   Cigarette smoker    Colon polyp    Complication of anesthesia    DIFFICULTY AWAKENING, BP INCREASE AFTER CHOLECYSTECTEMY    COPD (chronic obstructive pulmonary disease) (HCC)    Depression    Diverticulosis of colon    DJD (degenerative joint disease)    Family history of breast cancer    Family history of colon cancer    Family history of pancreatic cancer    Family history of prostate cancer    Gallstones    GERD (gastroesophageal reflux disease)    Pt. denies having GERD. Unable to remove it.   History of anemia    History of bronchitis    History of sarcoidosis    Hyperlipidemia    no meds needed   Hypertension    Incisional breast wound    AT AGE 67 BILATERAL INCISION TO BREAST MADE   Leg swelling    Panic disorder    Personal history of radiation therapy    Pre-diabetes    S/P radiation therapy 08/17/11 - 09/28/11   LLQ - 50 Gy/25 Fractions with Boost of 10 Gy / 5 fractions   Sialoadenitis of submandibular gland 2016   Vitamin D  deficiency    Past Surgical History:  Procedure Laterality Date   BIOPSY BREAST      left breast  as a teenager  benign   BREAST BIOPSY Left 06/06/2011   BREAST EXCISIONAL BIOPSY Bilateral    BREAST LUMPECTOMY Left 2013   BREAST SURGERY  07/05/11   left breast lumpectomy with needle loc & axillary sln bx   BUNIONECTOMY  2011   bilateral by Dr Arnetha Langton   CHOLECYSTECTOMY  02/2019   COLONOSCOPY     polyp   cyst removed from right hand  1995   POLYPECTOMY     TONSILLECTOMY  1972   TOTAL ABDOMINAL HYSTERECTOMY  2000   Dr Andree Kayser   TOTAL KNEE ARTHROPLASTY Right 07/02/2023   Procedure: ARTHROPLASTY, KNEE, TOTAL;  Surgeon: Wes Hamman, MD;  Location: [facility] OR;  Service: Orthopedics;  Laterality: Right;   TRIGGER FINGER RELEASE  2008   Dr Donzella Galley   Patient Active Problem List   Diagnosis Date Noted   Status post total right knee replacement 07/02/2023   Primary osteoarthritis of right knee 07/01/2023   Hypercholesterolemia 05/10/2022   Trochanteric bursitis of right hip 05/10/2022   Preop exam for  internal medicine 02/09/2022   Plantar flexed metatarsal bone of right foot 01/11/2022   Grief 09/28/2021   Leg swelling 04/18/2021   Incisional breast wound 04/18/2021   History of sarcoidosis 04/18/2021   History of bronchitis 04/18/2021   GERD (gastroesophageal reflux disease) 04/18/2021   Family history of prostate cancer 04/18/2021   Depression 04/18/2021   Complication of anesthesia 04/18/2021   Colon polyp 04/18/2021   Cigarette smoker 04/18/2021   Cancer (HCC) 04/18/2021   Coronary atherosclerosis 01/30/2021   Aortic atherosclerosis (HCC) 01/30/2021   Primary hypertension 01/14/2021   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   Knee pain, chronic 08/28/2019   Elevated BP without diagnosis of hypertension 08/28/2019   Callus of foot 08/28/2019   Cholelithiasis 12/26/2018   Genetic testing 12/26/2017   Family history of breast cancer    Family history of pancreatic cancer    Family history of colon cancer    Abdominal pain 07/28/2016   Morbid obesity (HCC) 03/13/2016    Dyspnea 11/09/2015   Rash and nonspecific skin eruption 07/27/2015   Well adult exam 07/23/2014   Sialoadenitis 06/02/2014   Edema 07/07/2013   AB (asthmatic bronchitis) 06/23/2013   Anxiety    Malignant neoplasm of upper-outer quadrant of left breast in female, 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   Allergy 06/06/2011   COLONIC POLYPS 01/25/2010   DIVERTICULOSIS OF COLON 01/25/2010   DEGENERATIVE JOINT DISEASE 11/06/2008   PANIC DISORDER 07/23/2008   Sarcoidosis 10/13/2007   Vitamin D  deficiency 10/13/2007   ANEMIA 10/13/2007   BRONCHITIS 10/13/2007   Dyslipidemia 04/23/2007   Adjustment disorder with mixed anxiety and depressed mood 04/23/2007   Venous (peripheral) insufficiency 04/23/2007   HEADACHE 04/23/2007    PCP: Adelaide Holy MD   REFERRING PROVIDER: Sandie Cross, PA-C  REFERRING DIAG: Diagnosis [phone] (ICD-10-CM) - Status post total right knee replacement  THERAPY DIAG:  Stiffness of right knee, not elsewhere classified  Difficulty in walking, not elsewhere classified  Muscle weakness (generalized)  Localized edema  Acute pain of right knee  Pain in right hip  Chronic pain of left knee  Rationale for Evaluation and Treatment: Rehabilitation  ONSET DATE: Surgery 07/02/23  SUBJECTIVE:   SUBJECTIVE STATEMENT: Pt states pain is not too bad this morning -- took 1 hydrocodone  around 10:30am. Has been able to get her CPM up to 90 deg.   From eval: Had my surgery on May 5th, also had HHPT which discharged last Friday. Still have the CPM but someone is coming to get it soon. Knee feels stiff. I need to get better with icing, still swollen. Working on getting grab bars in my shower, I do have a tub bench to use. There are steps in the house but I have a stair lift, this morning I was able to climb the steps. I think I may be allergic to the muscle relaxer, had a lot of panic attacks. Would be able to exercise down the line to help me lose weight    PERTINENT HISTORY: See above  PAIN:  Are you having pain? Yes: NPRS scale: 2/10 Pain location: surgical knee  Pain description: throbbing  Aggravating factors: bending the knee  Relieving factors: ice, elevation, not bending knee   PRECAUTIONS: Fall  WEIGHT BEARING RESTRICTIONS: No  FALLS:  Has patient fallen in last 6 months? No, no close calls, no FOF   LIVING ENVIRONMENT: Lives with: lives with their spouse Lives in: House/apartment Stairs: 5 STE, has steep flight of steps inside home  but stair lift available for use  Has following equipment at home: Otho Blitz - 2 wheeled, Shower bench, and stair lift, cane   OCCUPATION: retiredHydrographic surveyor at [employer]   PATIENT GOALS: be able to move without pain, get more mobile   NEXT MD VISIT: Referring 08/15/23  OBJECTIVE:  Note: Objective measures were completed at Evaluation unless otherwise noted.    PATIENT SURVEYS:   Patient-Specific Activity Scoring Scheme  "0" represents "unable to perform." "10" represents "able to perform at prior level. 0 1 2 3 4 5 6 7 8 9  10 (Date and Score)   Activity Eval     1. Getting up from chairs  5      2. Picking something up  3    3. Getting comfortable in bed  1   4.    5.    Score 3    Total score = sum of the activity scores/number of activities Minimum detectable change (90%CI) for average score = 2 points Minimum detectable change (90%CI) for single activity score = 3 points    COGNITION: Overall cognitive status: Within functional limits for tasks assessed     SENSATION: Not tested  EDEMA:  Appropriate for post-op condition   LOWER EXTREMITY ROM:  Active ROM Right eval Left eval  Hip flexion    Hip extension    Hip abduction    Hip adduction    Hip internal rotation    Hip external rotation    Knee flexion Supine heel slide 52*, HS curl in sitting 72*   Knee extension 9* supine with heel prop    Ankle dorsiflexion    Ankle plantarflexion    Ankle inversion     Ankle eversion     (Blank rows = not tested)  LOWER EXTREMITY MMT:  MMT Right eval Left eval  Hip flexion    Hip extension    Hip abduction    Hip adduction    Hip internal rotation    Hip external rotation    Knee flexion 3- 4+  Knee extension 4- 4+  Ankle dorsiflexion    Ankle plantarflexion    Ankle inversion    Ankle eversion     (Blank rows = not tested)    FUNCTIONAL TESTS:  Timed up and go (TUG): 29.7 seconds RW 2 3 minute walk test: 268ft, RW   GAIT: Distance walked: 249ft Assistive device utilized: Environmental consultant - 2 wheeled Level of assistance: Modified independence Comments: limited heel toe pattern, antalgic gait, tends to lean heavily on RW                                                                                                                                 TREATMENT DATE:  07/24/23 Nustep L5 x 5 min UEs/LEs Standing heel/toe raise 2x10 Standing hip abd red TB 2x10 Standing hip ext red TB 2x10 Sit<>stand x10 Seated quad set 2x10 Seated LAQ red TB  2x10 Seated hamstring curl red TB 2x10 Seated knee flexion self stretch 2x30" Vaso medium pressure, 34 deg, medium compression R knee elevated x 10 min  07/20/23  Exam, HEP, POC  Nustep seat 11 L1 x8 minutes all four extremities with holds into knee flexion and extension Education on POC, general progress and focus of PT in context of TKR, encouraged regular bouts of walking at home (up 5 minutes every 30 or 10 minutes every hour total), continue with HHPT HEP and we will progress it next visit   PATIENT EDUCATION:  Education details: exam findings, POC, HEP  Person educated: Patient Education method: Explanation, Demonstration, and Handouts Education comprehension: verbalized understanding, returned demonstration, and needs further education  HOME EXERCISE PROGRAM: Access Code: [email or web address] URL: [email or web address] Date: 07/24/2023 Prepared by: Julen Rubert April Erman Hayward  Exercises - Standing Heel Raise with Support  - 1 x daily - 7 x weekly - 2 sets - 10 reps - Hip Abduction with Resistance Loop  - 1 x daily - 7 x weekly - 2 sets - 10 reps - Hip Extension with Resistance Loop  - 1 x daily - 7 x weekly - 2 sets - 10 reps - Sit to Stand with Arm Reach Toward Target  - 1 x daily - 7 x weekly - 1 sets - 10 reps - Seated Knee Extension with Resistance  - 1 x daily - 7 x weekly - 2 sets - 10 reps - Seated Hamstring Curls with Resistance  - 1 x daily - 7 x weekly - 2 sets - 10 reps - Seated Knee Flexion Stretch  - 2-3 x daily - 7 x weekly - 2-3 sets - 20-30 sec hold - Seated Quad Set  - 2-3 x daily - 7 x weekly - 2-3 sets - 10 reps - 3 sec hold  ASSESSMENT:  CLINICAL IMPRESSION: Treatment session focused on reviewing and progressing pt's HHPT HEP. Added resistance band exercises with good pt tolerance.   From eval: Patient is a 68 y.o. F who was seen today for physical therapy evaluation and treatment for Diagnosis Z96.651 (ICD-10-CM) - Status post total right knee replacement. All findings as expected and without complication post-op. Anticipate she will do well with skilled PT services moving forward.    OBJECTIVE IMPAIRMENTS: Abnormal gait, decreased activity tolerance, decreased balance, decreased knowledge of use of DME, decreased mobility, difficulty walking, decreased ROM, decreased strength, hypomobility, increased fascial restrictions, impaired flexibility, and pain.    GOALS: Goals reviewed with patient? No  SHORT TERM GOALS: Target date: 08/17/2023    Will be compliant with appropriate progressive HEP Goal status: initial    2. R knee AROM flexion to be at least and extension AROM to be at least 95* Goal status: initial   3. Will be independent with edema management strategies  Goal status: initial    4. Gait pattern to have normalized with LRAD  Goal status: initial   5. Will complete TUG test in 15 seconds or less with LRAD to  show improved functional balance  Goal status: initial     LONG TERM GOALS: Target date: 09/14/2023    MMT to have improved by one grade all weak groups Goal status: initial   2. Knee AROM to be WNL and pain free Goal status: initial    3. Will be able to ascend and descend stairs reciprocally with no increase in pain Goal status: initial   4. Will be able to ambulate community distances and perform  all household tasks with no increase in pain  Goal status: initial   5. PSFS to have improved by at least 3 points to show improved QOL and subjective improvement  Goal status: initial    PLAN:  PT FREQUENCY: 2x/week  PT DURATION: 8 weeks  PLANNED INTERVENTIONS: 97750- Physical Performance Testing, 97110-Therapeutic exercises, 97530- Therapeutic activity, 97112- Neuromuscular re-education, 97535- Self Care, 24401- Manual therapy, [phone]- Gait training, [phone]- Vasopneumatic device, Balance training, Stair training, Taping, Dry Needling, and DME instructions  PLAN FOR NEXT SESSION: ROM and strength progressions as appropriate, gait training, manual as desired, edema management  Ayansh Feutz April Ma L Emmilyn Crooke, PT, DPT 07/24/23 11:04 AM

## 2023-07-25 ENCOUNTER — Encounter: Payer: Self-pay | Admitting: Physical Therapy

## 2023-07-25 ENCOUNTER — Ambulatory Visit (INDEPENDENT_AMBULATORY_CARE_PROVIDER_SITE_OTHER): Admitting: Physical Therapy

## 2023-07-25 DIAGNOSIS — M25661 Stiffness of right knee, not elsewhere classified: Secondary | ICD-10-CM | POA: Diagnosis not present

## 2023-07-25 DIAGNOSIS — R600 Localized edema: Secondary | ICD-10-CM | POA: Diagnosis not present

## 2023-07-25 DIAGNOSIS — M6281 Muscle weakness (generalized): Secondary | ICD-10-CM

## 2023-07-25 DIAGNOSIS — R262 Difficulty in walking, not elsewhere classified: Secondary | ICD-10-CM

## 2023-07-25 NOTE — Therapy (Signed)
 OUTPATIENT PHYSICAL THERAPY LOWER EXTREMITY TREATMENT   Patient Name: Marilyn [name] MRN: [id number] DOB:07-May-1955, 68 y.o., female Today's Date: 07/25/2023  END OF SESSION:  PT End of Session - 07/25/23 1114     Visit Number 3    Number of Visits 17    Date for PT Re-Evaluation 09/14/23    Authorization Type [employer]    Authorization Time Period 07/20/23 to 09/14/23    Progress Note Due on Visit 60    PT Start Time 1102    PT Stop Time 1152    PT Time Calculation (min) 50 min    Activity Tolerance Patient tolerated treatment well    Behavior During Therapy [facility] for tasks assessed/performed               Past Medical History:  Diagnosis Date   Allergy    Anemia    hx   Anxiety    panic disorder   Breast cancer (HCC) 06/06/2011   bc  left breast 3 o'clock dx=invasive ductal ca uoqER/PR=positive   Cancer (HCC)    BREAST - left   Cigarette smoker    Colon polyp    Complication of anesthesia    DIFFICULTY AWAKENING, BP INCREASE AFTER CHOLECYSTECTEMY    COPD (chronic obstructive pulmonary disease) (HCC)    Depression    Diverticulosis of colon    DJD (degenerative joint disease)    Family history of breast cancer    Family history of colon cancer    Family history of pancreatic cancer    Family history of prostate cancer    Gallstones    GERD (gastroesophageal reflux disease)    Pt. denies having GERD. Unable to remove it.   History of anemia    History of bronchitis    History of sarcoidosis    Hyperlipidemia    no meds needed   Hypertension    Incisional breast wound    AT AGE 48 BILATERAL INCISION TO BREAST MADE   Leg swelling    Panic disorder    Personal history of radiation therapy    Pre-diabetes    S/P radiation therapy 08/17/11 - 09/28/11   LLQ - 50 Gy/25 Fractions with Boost of 10 Gy / 5 fractions   Sialoadenitis of submandibular gland 2016   Vitamin D  deficiency    Past Surgical History:  Procedure Laterality Date   BIOPSY BREAST      left breast  as a teenager  benign   BREAST BIOPSY Left 06/06/2011   BREAST EXCISIONAL BIOPSY Bilateral    BREAST LUMPECTOMY Left 2013   BREAST SURGERY  07/05/11   left breast lumpectomy with needle loc & axillary sln bx   BUNIONECTOMY  2011   bilateral by Dr Arnetha Langton   CHOLECYSTECTOMY  02/2019   COLONOSCOPY     polyp   cyst removed from right hand  1995   POLYPECTOMY     TONSILLECTOMY  1972   TOTAL ABDOMINAL HYSTERECTOMY  2000   Dr Andree Kayser   TOTAL KNEE ARTHROPLASTY Right 07/02/2023   Procedure: ARTHROPLASTY, KNEE, TOTAL;  Surgeon: Wes Hamman, MD;  Location: [facility] OR;  Service: Orthopedics;  Laterality: Right;   TRIGGER FINGER RELEASE  2008   Dr Donzella Galley   Patient Active Problem List   Diagnosis Date Noted   Status post total right knee replacement 07/02/2023   Primary osteoarthritis of right knee 07/01/2023   Hypercholesterolemia 05/10/2022   Trochanteric bursitis of right hip 05/10/2022   Preop exam  for internal medicine 02/09/2022   Plantar flexed metatarsal bone of right foot 01/11/2022   Grief 09/28/2021   Leg swelling 04/18/2021   Incisional breast wound 04/18/2021   History of sarcoidosis 04/18/2021   History of bronchitis 04/18/2021   GERD (gastroesophageal reflux disease) 04/18/2021   Family history of prostate cancer 04/18/2021   Depression 04/18/2021   Complication of anesthesia 04/18/2021   Colon polyp 04/18/2021   Cigarette smoker 04/18/2021   Cancer (HCC) 04/18/2021   Coronary atherosclerosis 01/30/2021   Aortic atherosclerosis (HCC) 01/30/2021   Primary hypertension 01/14/2021   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   Knee pain, chronic 08/28/2019   Elevated BP without diagnosis of hypertension 08/28/2019   Callus of foot 08/28/2019   Cholelithiasis 12/26/2018   Genetic testing 12/26/2017   Family history of breast cancer    Family history of pancreatic cancer    Family history of colon cancer    Abdominal pain 07/28/2016   Morbid obesity (HCC) 03/13/2016    Dyspnea 11/09/2015   Rash and nonspecific skin eruption 07/27/2015   Well adult exam 07/23/2014   Sialoadenitis 06/02/2014   Edema 07/07/2013   AB (asthmatic bronchitis) 06/23/2013   Anxiety    Malignant neoplasm of upper-outer quadrant of left breast in female, 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   Allergy 06/06/2011   COLONIC POLYPS 01/25/2010   DIVERTICULOSIS OF COLON 01/25/2010   DEGENERATIVE JOINT DISEASE 11/06/2008   PANIC DISORDER 07/23/2008   Sarcoidosis 10/13/2007   Vitamin D  deficiency 10/13/2007   ANEMIA 10/13/2007   BRONCHITIS 10/13/2007   Dyslipidemia 04/23/2007   Adjustment disorder with mixed anxiety and depressed mood 04/23/2007   Venous (peripheral) insufficiency 04/23/2007   HEADACHE 04/23/2007    PCP: Adelaide Holy MD   REFERRING PROVIDER: Sandie Cross, PA-C  REFERRING DIAG: Diagnosis [phone] (ICD-10-CM) - Status post total right knee replacement  THERAPY DIAG:  Stiffness of right knee, not elsewhere classified  Difficulty in walking, not elsewhere classified  Muscle weakness (generalized)  Localized edema  Rationale for Evaluation and Treatment: Rehabilitation  ONSET DATE: Surgery 07/02/23  SUBJECTIVE:   SUBJECTIVE STATEMENT: Pt arriving today reporting 5/10 pain in right knee. Pt amb into clinic with rolling walker.    From eval: Had my surgery on May 5th, also had HHPT which discharged last Friday. Still have the CPM but someone is coming to get it soon. Knee feels stiff. I need to get better with icing, still swollen. Working on getting grab bars in my shower, I do have a tub bench to use. There are steps in the house but I have a stair lift, this morning I was able to climb the steps. I think I may be allergic to the muscle relaxer, had a lot of panic attacks. Would be able to exercise down the line to help me lose weight   PERTINENT HISTORY: See above  PAIN:  Are you having pain? Yes: NPRS scale: 5/10 Pain location:  surgical knee  Pain description: throbbing  Aggravating factors: bending the knee  Relieving factors: ice, elevation, not bending knee   PRECAUTIONS: Fall  WEIGHT BEARING RESTRICTIONS: No  FALLS:  Has patient fallen in last 6 months? No, no close calls, no FOF   LIVING ENVIRONMENT: Lives with: lives with their spouse Lives in: House/apartment Stairs: 5 STE, has steep flight of steps inside home but stair lift available for use  Has following equipment at home: Otho Blitz - 2 wheeled, Shower bench, and stair lift, cane   OCCUPATION:  retiredHydrographic surveyor at [employer]   PATIENT GOALS: be able to move without pain, get more mobile   NEXT MD VISIT: Referring 08/15/23  OBJECTIVE:  Note: Objective measures were completed at Evaluation unless otherwise noted.    PATIENT SURVEYS:   Patient-Specific Activity Scoring Scheme  "0" represents "unable to perform." "10" represents "able to perform at prior level. 0 1 2 3 4 5 6 7 8 9  10 (Date and Score)   Activity Eval     1. Getting up from chairs  5      2. Picking something up  3    3. Getting comfortable in bed  1   4.    5.    Score 3    Total score = sum of the activity scores/number of activities Minimum detectable change (90%CI) for average score = 2 points Minimum detectable change (90%CI) for single activity score = 3 points    COGNITION: Overall cognitive status: Within functional limits for tasks assessed     SENSATION: Not tested  EDEMA:  Appropriate for post-op condition   LOWER EXTREMITY ROM:  Active ROM Right eval Left eval Rt 07/25/23  Hip flexion     Hip extension     Hip abduction     Hip adduction     Hip internal rotation     Hip external rotation     Knee flexion Supine heel slide 52*, HS curl in sitting 72*  Supine AA: 65 P: 78  Knee extension 9* supine with heel prop   A: -10 P: -8  Ankle dorsiflexion     Ankle plantarflexion     Ankle inversion     Ankle eversion      (Blank rows = not  tested)  LOWER EXTREMITY MMT:  MMT Right eval Left eval  Hip flexion    Hip extension    Hip abduction    Hip adduction    Hip internal rotation    Hip external rotation    Knee flexion 3- 4+  Knee extension 4- 4+  Ankle dorsiflexion    Ankle plantarflexion    Ankle inversion    Ankle eversion     (Blank rows = not tested)    FUNCTIONAL TESTS:  Eval:  Timed up and go (TUG): 29.7 seconds RW 2 3 minute walk test: 262ft, RW   GAIT: Distance walked: 278ft Assistive device utilized: Environmental consultant - 2 wheeled Level of assistance: Modified independence Comments: limited heel toe pattern, antalgic gait, tends to lean heavily on RW                                                                                                                                 TREATMENT DATE:  07/25/23 TherEx:  Nustep: Level 5 x 7 minutes UE/LE Heel/Toe raises x 20 c UE support Calf stretch on slant board x 3 holding 30 sec LAQ: x 10 no weight and then x  10 c 3#  Hamstring curls 2 x 10 red TB Rt LE Seated SLR 2 x 10 Rt LE TherActivites:  Sit to stand: x 10 from chair 18 inches with UE support Standing marching single UE support  Manual:  Contract relax in sitting Passive Rt knee flexion in supine   Modalities:  Vasopneumatic, medium compression 34 deg Rt LE elevated on bolster x 10 minutes     07/24/23 Nustep L5 x 5 min UEs/LEs Standing heel/toe raise 2x10 Standing hip abd red TB 2x10 Standing hip ext red TB 2x10 Sit<>stand x10 Seated quad set 2x10 Seated LAQ red TB 2x10 Seated hamstring curl red TB 2x10 Seated knee flexion self stretch 2x30" Vaso medium pressure, 34 deg, medium compression R knee elevated x 10 min     PATIENT EDUCATION:  Education details: exam findings, POC, HEP  Person educated: Patient Education method: Explanation, Demonstration, and Handouts Education comprehension: verbalized understanding, returned demonstration, and needs further education  HOME  EXERCISE PROGRAM: Access Code: [email or web address] URL: [email or web address] Date: 07/24/2023 Prepared by: Gellen April Erman [place]  Exercises - Standing Heel Raise with Support  - 1 x daily - 7 x weekly - 2 sets - 10 reps - Hip Abduction with Resistance Loop  - 1 x daily - 7 x weekly - 2 sets - 10 reps - Hip Extension with Resistance Loop  - 1 x daily - 7 x weekly - 2 sets - 10 reps - Sit to Stand with Arm Reach Toward Target  - 1 x daily - 7 x weekly - 1 sets - 10 reps - Seated Knee Extension with Resistance  - 1 x daily - 7 x weekly - 2 sets - 10 reps - Seated Hamstring Curls with Resistance  - 1 x daily - 7 x weekly - 2 sets - 10 reps - Seated Knee Flexion Stretch  - 2-3 x daily - 7 x weekly - 2-3 sets - 20-30 sec hold - Seated Quad Set  - 2-3 x daily - 7 x weekly - 2-3 sets - 10 reps - 3 sec hold  ASSESSMENT:  CLINICAL IMPRESSION: Pt stating her HEP is going well. Pt stating she has been keeping up with them on her tablet. Pt stating she took her pain meds before coming to therapy although it seems to make her drowsy. Pt's passive ROM was 78 degs. Pt encouraged to continued to focus on increased flexion and extension at home. Pt tolerating all exercises well.      OBJECTIVE IMPAIRMENTS: Abnormal gait, decreased activity tolerance, decreased balance, decreased knowledge of use of DME, decreased mobility, difficulty walking, decreased ROM, decreased strength, hypomobility, increased fascial restrictions, impaired flexibility, and pain.    GOALS: Goals reviewed with patient? No  SHORT TERM GOALS: Target date: 08/17/2023    Will be compliant with appropriate progressive HEP Goal status: MET 07/25/23   2. R knee AROM flexion to be at least and extension AROM to be at least 95* Goal status:  On-going 07/25/23   3. Will be independent with edema management strategies  Goal status: initial    4. Gait pattern to have normalized with LRAD  Goal status: initial   5. Will  complete TUG test in 15 seconds or less with LRAD to show improved functional balance  Goal status: initial     LONG TERM GOALS: Target date: 09/14/2023    MMT to have improved by one grade all weak groups Goal status: initial   2. Knee AROM to be  WNL and pain free Goal status: initial    3. Will be able to ascend and descend stairs reciprocally with no increase in pain Goal status: initial   4. Will be able to ambulate community distances and perform all household tasks with no increase in pain  Goal status: initial   5. PSFS to have improved by at least 3 points to show improved QOL and subjective improvement  Goal status: initial    PLAN:  PT FREQUENCY: 2x/week  PT DURATION: 8 weeks  PLANNED INTERVENTIONS: 97750- Physical Performance Testing, 97110-Therapeutic exercises, 97530- Therapeutic activity, 97112- Neuromuscular re-education, 97535- Self Care, [id number]- Manual therapy, [id number]- Gait training, [phone]- Vasopneumatic device, Balance training, Stair training, Taping, Dry Needling, and DME instructions  PLAN FOR NEXT SESSION: ROM and strength progressions as appropriate, gait training, manual as desired, edema management  Marysue Sola, PT, MPT 07/25/23 11:46 AM

## 2023-07-31 ENCOUNTER — Encounter: Payer: Self-pay | Admitting: Physical Therapy

## 2023-07-31 ENCOUNTER — Ambulatory Visit (INDEPENDENT_AMBULATORY_CARE_PROVIDER_SITE_OTHER): Admitting: Physical Therapy

## 2023-07-31 DIAGNOSIS — M6281 Muscle weakness (generalized): Secondary | ICD-10-CM | POA: Diagnosis not present

## 2023-07-31 DIAGNOSIS — M25661 Stiffness of right knee, not elsewhere classified: Secondary | ICD-10-CM

## 2023-07-31 DIAGNOSIS — R262 Difficulty in walking, not elsewhere classified: Secondary | ICD-10-CM

## 2023-07-31 DIAGNOSIS — M25561 Pain in right knee: Secondary | ICD-10-CM

## 2023-07-31 DIAGNOSIS — R600 Localized edema: Secondary | ICD-10-CM | POA: Diagnosis not present

## 2023-07-31 NOTE — Therapy (Signed)
 OUTPATIENT PHYSICAL THERAPY LOWER EXTREMITY TREATMENT   Patient Name: Marilyn [name] MRN: [id number] DOB:1955-03-26, 68 y.o., female Today's Date: 07/31/2023  END OF SESSION:  PT End of Session - 07/31/23 0804     Visit Number 4    Number of Visits 17    Date for PT Re-Evaluation 09/14/23    Authorization Type [employer]    Authorization Time Period 07/20/23 to 09/14/23    Progress Note Due on Visit 60    PT Start Time 0804    PT Stop Time 0843    PT Time Calculation (min) 39 min    Activity Tolerance Patient tolerated treatment well    Behavior During Therapy [facility] for tasks assessed/performed             Past Medical History:  Diagnosis Date   Allergy    Anemia    hx   Anxiety    panic disorder   Breast cancer (HCC) 06/06/2011   bc  left breast 3 o'clock dx=invasive ductal ca uoqER/PR=positive   Cancer (HCC)    BREAST - left   Cigarette smoker    Colon polyp    Complication of anesthesia    DIFFICULTY AWAKENING, BP INCREASE AFTER CHOLECYSTECTEMY    COPD (chronic obstructive pulmonary disease) (HCC)    Depression    Diverticulosis of colon    DJD (degenerative joint disease)    Family history of breast cancer    Family history of colon cancer    Family history of pancreatic cancer    Family history of prostate cancer    Gallstones    GERD (gastroesophageal reflux disease)    Pt. denies having GERD. Unable to remove it.   History of anemia    History of bronchitis    History of sarcoidosis    Hyperlipidemia    no meds needed   Hypertension    Incisional breast wound    AT AGE 73 BILATERAL INCISION TO BREAST MADE   Leg swelling    Panic disorder    Personal history of radiation therapy    Pre-diabetes    S/P radiation therapy 08/17/11 - 09/28/11   LLQ - 50 Gy/25 Fractions with Boost of 10 Gy / 5 fractions   Sialoadenitis of submandibular gland 2016   Vitamin D  deficiency    Past Surgical History:  Procedure Laterality Date   BIOPSY BREAST      left breast  as a teenager  benign   BREAST BIOPSY Left 06/06/2011   BREAST EXCISIONAL BIOPSY Bilateral    BREAST LUMPECTOMY Left 2013   BREAST SURGERY  07/05/11   left breast lumpectomy with needle loc & axillary sln bx   BUNIONECTOMY  2011   bilateral by Dr Arnetha Langton   CHOLECYSTECTOMY  02/2019   COLONOSCOPY     polyp   cyst removed from right hand  1995   POLYPECTOMY     TONSILLECTOMY  1972   TOTAL ABDOMINAL HYSTERECTOMY  2000   Dr Andree Kayser   TOTAL KNEE ARTHROPLASTY Right 07/02/2023   Procedure: ARTHROPLASTY, KNEE, TOTAL;  Surgeon: Wes Hamman, MD;  Location: [facility] OR;  Service: Orthopedics;  Laterality: Right;   TRIGGER FINGER RELEASE  2008   Dr Donzella Galley   Patient Active Problem List   Diagnosis Date Noted   Status post total right knee replacement 07/02/2023   Primary osteoarthritis of right knee 07/01/2023   Hypercholesterolemia 05/10/2022   Trochanteric bursitis of right hip 05/10/2022   Preop exam for internal  medicine 02/09/2022   Plantar flexed metatarsal bone of right foot 01/11/2022   Grief 09/28/2021   Leg swelling 04/18/2021   Incisional breast wound 04/18/2021   History of sarcoidosis 04/18/2021   History of bronchitis 04/18/2021   GERD (gastroesophageal reflux disease) 04/18/2021   Family history of prostate cancer 04/18/2021   Depression 04/18/2021   Complication of anesthesia 04/18/2021   Colon polyp 04/18/2021   Cigarette smoker 04/18/2021   Cancer (HCC) 04/18/2021   Coronary atherosclerosis 01/30/2021   Aortic atherosclerosis (HCC) 01/30/2021   Primary hypertension 01/14/2021   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   Knee pain, chronic 08/28/2019   Elevated BP without diagnosis of hypertension 08/28/2019   Callus of foot 08/28/2019   Cholelithiasis 12/26/2018   Genetic testing 12/26/2017   Family history of breast cancer    Family history of pancreatic cancer    Family history of colon cancer    Abdominal pain 07/28/2016   Morbid obesity (HCC) 03/13/2016    Dyspnea 11/09/2015   Rash and nonspecific skin eruption 07/27/2015   Well adult exam 07/23/2014   Sialoadenitis 06/02/2014   Edema 07/07/2013   AB (asthmatic bronchitis) 06/23/2013   Anxiety    Malignant neoplasm of upper-outer quadrant of left breast in female, 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   Allergy 06/06/2011   COLONIC POLYPS 01/25/2010   DIVERTICULOSIS OF COLON 01/25/2010   DEGENERATIVE JOINT DISEASE 11/06/2008   PANIC DISORDER 07/23/2008   Sarcoidosis 10/13/2007   Vitamin D  deficiency 10/13/2007   ANEMIA 10/13/2007   BRONCHITIS 10/13/2007   Dyslipidemia 04/23/2007   Adjustment disorder with mixed anxiety and depressed mood 04/23/2007   Venous (peripheral) insufficiency 04/23/2007   HEADACHE 04/23/2007    PCP: Adelaide Holy MD   REFERRING PROVIDER: Sandie Cross, PA-C  REFERRING DIAG: Diagnosis [phone] (ICD-10-CM) - Status post total right knee replacement  THERAPY DIAG:  Stiffness of right knee, not elsewhere classified  Difficulty in walking, not elsewhere classified  Muscle weakness (generalized)  Localized edema  Acute pain of right knee  Rationale for Evaluation and Treatment: Rehabilitation  ONSET DATE: Surgery 07/02/23  SUBJECTIVE:   SUBJECTIVE STATEMENT: Pt states she got an elevator pillow to help with her edema. States her L knee is swelling too and just as much as her R. Reports her CPM is up to 90 deg.   From eval: Had my surgery on May 5th, also had HHPT which discharged last Friday. Still have the CPM but someone is coming to get it soon. Knee feels stiff. I need to get better with icing, still swollen. Working on getting grab bars in my shower, I do have a tub bench to use. There are steps in the house but I have a stair lift, this morning I was able to climb the steps. I think I may be allergic to the muscle relaxer, had a lot of panic attacks. Would be able to exercise down the line to help me lose weight   PERTINENT  HISTORY: See above  PAIN:  Are you having pain? Yes: NPRS scale: 5/10 Pain location: surgical knee  Pain description: throbbing  Aggravating factors: bending the knee  Relieving factors: ice, elevation, not bending knee   PRECAUTIONS: Fall  WEIGHT BEARING RESTRICTIONS: No  FALLS:  Has patient fallen in last 6 months? No, no close calls, no FOF   LIVING ENVIRONMENT: Lives with: lives with their spouse Lives in: House/apartment Stairs: 5 STE, has steep flight of steps inside home but stair lift available  for use  Has following equipment at home: Otho Blitz - 2 wheeled, Shower bench, and stair lift, cane   OCCUPATION: retiredHydrographic surveyor at [employer]   PATIENT GOALS: be able to move without pain, get more mobile   NEXT MD VISIT: Referring 08/15/23  OBJECTIVE:  Note: Objective measures were completed at Evaluation unless otherwise noted.    PATIENT SURVEYS:   Patient-Specific Activity Scoring Scheme  "0" represents "unable to perform." "10" represents "able to perform at prior level. 0 1 2 3 4 5 6 7 8 9  10 (Date and Score)   Activity Eval     1. Getting up from chairs  5      2. Picking something up  3    3. Getting comfortable in bed  1   4.    5.    Score 3    Total score = sum of the activity scores/number of activities Minimum detectable change (90%CI) for average score = 2 points Minimum detectable change (90%CI) for single activity score = 3 points    COGNITION: Overall cognitive status: Within functional limits for tasks assessed     SENSATION: Not tested  EDEMA:  Appropriate for post-op condition   LOWER EXTREMITY ROM:  Active ROM Right eval Left eval Rt 07/25/23 Rt 07/31/23  Hip flexion      Hip extension      Hip abduction      Hip adduction      Hip internal rotation      Hip external rotation      Knee flexion Supine heel slide 52*, HS curl in sitting 72*  Supine AA: 65 P: 78 Supine AA: 80   Knee extension 9* supine with heel prop   A:  -10 P: -8 AA: -5  Ankle dorsiflexion      Ankle plantarflexion      Ankle inversion      Ankle eversion       (Blank rows = not tested)  LOWER EXTREMITY MMT:  MMT Right eval Left eval  Hip flexion    Hip extension    Hip abduction    Hip adduction    Hip internal rotation    Hip external rotation    Knee flexion 3- 4+  Knee extension 4- 4+  Ankle dorsiflexion    Ankle plantarflexion    Ankle inversion    Ankle eversion     (Blank rows = not tested)    FUNCTIONAL TESTS:  Eval:  Timed up and go (TUG): 29.7 seconds RW 2 3 minute walk test: 240ft, RW   GAIT: Distance walked: 246ft Assistive device utilized: Environmental consultant - 2 wheeled Level of assistance: Modified independence Comments: limited heel toe pattern, antalgic gait, tends to lean heavily on RW  TREATMENT DATE:  07/31/23 TherEx: Nustep L5 x 6 min UEs/LEs (scooted seat fwd periodically) Feet on pball Knee flexion with strap x20 Hamstring curl iso x15 Knees extended bridge 2x10 SLR with strap to maintain terminal knee ext 2x10 Sidelying hip abd 2x10 TherActivities: Lunge on to 6" step 2x10 Fwd step tap 6" step 2x10 Modalities:  Vasopneumatic, medium compression 34 deg Rt LE elevated on bolster x 10 minutes  07/25/23 TherEx:  Nustep: Level 5 x 7 minutes UE/LE Heel/Toe raises x 20 c UE support Calf stretch on slant board x 3 holding 30 sec LAQ: x 10 no weight and then x 10 c 3#  Hamstring curls 2 x 10 red TB Rt LE Seated SLR 2 x 10 Rt LE TherActivites:  Sit to stand: x 10 from chair 18 inches with UE support Standing marching single UE support  Manual:  Contract relax in sitting Passive Rt knee flexion in supine   Modalities:  Vasopneumatic, medium compression 34 deg Rt LE elevated on bolster x 10 minutes     07/24/23 Nustep L5 x 5 min UEs/LEs Standing heel/toe raise  2x10 Standing hip abd red TB 2x10 Standing hip ext red TB 2x10 Sit<>stand x10 Seated quad set 2x10 Seated LAQ red TB 2x10 Seated hamstring curl red TB 2x10 Seated knee flexion self stretch 2x30" Vaso medium pressure, 34 deg, medium compression R knee elevated x 10 min     PATIENT EDUCATION:  Education details: exam findings, POC, HEP  Person educated: Patient Education method: Explanation, Demonstration, and Handouts Education comprehension: verbalized understanding, returned demonstration, and needs further education  HOME EXERCISE PROGRAM: Access Code: [email or web address] URL: [email or web address] Date: 07/24/2023 Prepared by: Nahjae Hoeg April Erman Hayward  Exercises - Standing Heel Raise with Support  - 1 x daily - 7 x weekly - 2 sets - 10 reps - Hip Abduction with Resistance Loop  - 1 x daily - 7 x weekly - 2 sets - 10 reps - Hip Extension with Resistance Loop  - 1 x daily - 7 x weekly - 2 sets - 10 reps - Sit to Stand with Arm Reach Toward Target  - 1 x daily - 7 x weekly - 1 sets - 10 reps - Seated Knee Extension with Resistance  - 1 x daily - 7 x weekly - 2 sets - 10 reps - Seated Hamstring Curls with Resistance  - 1 x daily - 7 x weekly - 2 sets - 10 reps - Seated Knee Flexion Stretch  - 2-3 x daily - 7 x weekly - 2-3 sets - 20-30 sec hold - Seated Quad Set  - 2-3 x daily - 7 x weekly - 2-3 sets - 10 reps - 3 sec hold  ASSESSMENT:  CLINICAL IMPRESSION: Continued to work on knee ROM and strengthening. Increasing flexion. Ext is almost full at this point. Has been getting more edema in L knee now too -- will monitor with her activities. May benefit from working on gait trianing with LRAD next session.      OBJECTIVE IMPAIRMENTS: Abnormal gait, decreased activity tolerance, decreased balance, decreased knowledge of use of DME, decreased mobility, difficulty walking, decreased ROM, decreased strength, hypomobility, increased fascial restrictions, impaired flexibility,  and pain.    GOALS: Goals reviewed with patient? No  SHORT TERM GOALS: Target date: 08/17/2023    Will be compliant with appropriate progressive HEP Goal status: MET 07/25/23   2. R knee AROM flexion to be at least and extension AROM to be at least  95* Goal status:  On-going 07/25/23   3. Will be independent with edema management strategies  Goal status: initial    4. Gait pattern to have normalized with LRAD  Goal status: initial   5. Will complete TUG test in 15 seconds or less with LRAD to show improved functional balance  Goal status: initial     LONG TERM GOALS: Target date: 09/14/2023    MMT to have improved by one grade all weak groups Goal status: initial   2. Knee AROM to be WNL and pain free Goal status: initial    3. Will be able to ascend and descend stairs reciprocally with no increase in pain Goal status: initial   4. Will be able to ambulate community distances and perform all household tasks with no increase in pain  Goal status: initial   5. PSFS to have improved by at least 3 points to show improved QOL and subjective improvement  Goal status: initial    PLAN:  PT FREQUENCY: 2x/week  PT DURATION: 8 weeks  PLANNED INTERVENTIONS: 97750- Physical Performance Testing, 97110-Therapeutic exercises, 97530- Therapeutic activity, 97112- Neuromuscular re-education, 97535- Self Care, 16109- Manual therapy, 97116- Gait training, [phone]- Vasopneumatic device, Balance training, Stair training, Taping, Dry Needling, and DME instructions  PLAN FOR NEXT SESSION: ROM and strength progressions as appropriate, gait training (walk without a/d), manual as desired, edema management  Jakin Pavao April Ma L Jolisa Intriago, PT, DPT 07/31/23 8:04 AM

## 2023-08-01 ENCOUNTER — Encounter (INDEPENDENT_AMBULATORY_CARE_PROVIDER_SITE_OTHER): Payer: Self-pay

## 2023-08-03 ENCOUNTER — Encounter: Payer: Self-pay | Admitting: Physical Therapy

## 2023-08-03 ENCOUNTER — Ambulatory Visit: Admitting: Physical Therapy

## 2023-08-03 DIAGNOSIS — M6281 Muscle weakness (generalized): Secondary | ICD-10-CM | POA: Diagnosis not present

## 2023-08-03 DIAGNOSIS — M25561 Pain in right knee: Secondary | ICD-10-CM

## 2023-08-03 DIAGNOSIS — M25661 Stiffness of right knee, not elsewhere classified: Secondary | ICD-10-CM | POA: Diagnosis not present

## 2023-08-03 DIAGNOSIS — R600 Localized edema: Secondary | ICD-10-CM | POA: Diagnosis not present

## 2023-08-03 DIAGNOSIS — R262 Difficulty in walking, not elsewhere classified: Secondary | ICD-10-CM

## 2023-08-03 NOTE — Therapy (Signed)
 OUTPATIENT PHYSICAL THERAPY LOWER EXTREMITY TREATMENT   Patient Name: Marilyn [name] MRN: [id number] DOB:Aug 18, 1955, 68 y.o., female Today's Date: 08/03/2023  END OF SESSION:  PT End of Session - 08/03/23 1028     Visit Number 5    Number of Visits 17    Date for PT Re-Evaluation 09/14/23    Authorization Type [employer]    Authorization Time Period 07/20/23 to 09/14/23    Progress Note Due on Visit 60    PT Start Time 1017    PT Stop Time 1056    PT Time Calculation (min) 39 min    Activity Tolerance Patient tolerated treatment well    Behavior During Therapy [facility] for tasks assessed/performed              Past Medical History:  Diagnosis Date   Allergy    Anemia    hx   Anxiety    panic disorder   Breast cancer (HCC) 06/06/2011   bc  left breast 3 o'clock dx=invasive ductal ca uoqER/PR=positive   Cancer (HCC)    BREAST - left   Cigarette smoker    Colon polyp    Complication of anesthesia    DIFFICULTY AWAKENING, BP INCREASE AFTER CHOLECYSTECTEMY    COPD (chronic obstructive pulmonary disease) (HCC)    Depression    Diverticulosis of colon    DJD (degenerative joint disease)    Family history of breast cancer    Family history of colon cancer    Family history of pancreatic cancer    Family history of prostate cancer    Gallstones    GERD (gastroesophageal reflux disease)    Pt. denies having GERD. Unable to remove it.   History of anemia    History of bronchitis    History of sarcoidosis    Hyperlipidemia    no meds needed   Hypertension    Incisional breast wound    AT AGE 25 BILATERAL INCISION TO BREAST MADE   Leg swelling    Panic disorder    Personal history of radiation therapy    Pre-diabetes    S/P radiation therapy 08/17/11 - 09/28/11   LLQ - 50 Gy/25 Fractions with Boost of 10 Gy / 5 fractions   Sialoadenitis of submandibular gland 2016   Vitamin D  deficiency    Past Surgical History:  Procedure Laterality Date   BIOPSY BREAST      left breast  as a teenager  benign   BREAST BIOPSY Left 06/06/2011   BREAST EXCISIONAL BIOPSY Bilateral    BREAST LUMPECTOMY Left 2013   BREAST SURGERY  07/05/11   left breast lumpectomy with needle loc & axillary sln bx   BUNIONECTOMY  2011   bilateral by Dr Arnetha Langton   CHOLECYSTECTOMY  02/2019   COLONOSCOPY     polyp   cyst removed from right hand  1995   POLYPECTOMY     TONSILLECTOMY  1972   TOTAL ABDOMINAL HYSTERECTOMY  2000   Dr Andree Kayser   TOTAL KNEE ARTHROPLASTY Right 07/02/2023   Procedure: ARTHROPLASTY, KNEE, TOTAL;  Surgeon: Wes Hamman, MD;  Location: [facility] OR;  Service: Orthopedics;  Laterality: Right;   TRIGGER FINGER RELEASE  2008   Dr Donzella Galley   Patient Active Problem List   Diagnosis Date Noted   Status post total right knee replacement 07/02/2023   Primary osteoarthritis of right knee 07/01/2023   Hypercholesterolemia 05/10/2022   Trochanteric bursitis of right hip 05/10/2022   Preop exam for  internal medicine 02/09/2022   Plantar flexed metatarsal bone of right foot 01/11/2022   Grief 09/28/2021   Leg swelling 04/18/2021   Incisional breast wound 04/18/2021   History of sarcoidosis 04/18/2021   History of bronchitis 04/18/2021   GERD (gastroesophageal reflux disease) 04/18/2021   Family history of prostate cancer 04/18/2021   Depression 04/18/2021   Complication of anesthesia 04/18/2021   Colon polyp 04/18/2021   Cigarette smoker 04/18/2021   Cancer (HCC) 04/18/2021   Coronary atherosclerosis 01/30/2021   Aortic atherosclerosis (HCC) 01/30/2021   Primary hypertension 01/14/2021   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   Knee pain, chronic 08/28/2019   Elevated BP without diagnosis of hypertension 08/28/2019   Callus of foot 08/28/2019   Cholelithiasis 12/26/2018   Genetic testing 12/26/2017   Family history of breast cancer    Family history of pancreatic cancer    Family history of colon cancer    Abdominal pain 07/28/2016   Morbid obesity (HCC) 03/13/2016    Dyspnea 11/09/2015   Rash and nonspecific skin eruption 07/27/2015   Well adult exam 07/23/2014   Sialoadenitis 06/02/2014   Edema 07/07/2013   AB (asthmatic bronchitis) 06/23/2013   Anxiety    Malignant neoplasm of upper-outer quadrant of left breast in female, 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   Allergy 06/06/2011   COLONIC POLYPS 01/25/2010   DIVERTICULOSIS OF COLON 01/25/2010   DEGENERATIVE JOINT DISEASE 11/06/2008   PANIC DISORDER 07/23/2008   Sarcoidosis 10/13/2007   Vitamin D  deficiency 10/13/2007   ANEMIA 10/13/2007   BRONCHITIS 10/13/2007   Dyslipidemia 04/23/2007   Adjustment disorder with mixed anxiety and depressed mood 04/23/2007   Venous (peripheral) insufficiency 04/23/2007   HEADACHE 04/23/2007    PCP: Adelaide Holy MD   REFERRING PROVIDER: Sandie Cross, PA-C  REFERRING DIAG: Diagnosis [phone] (ICD-10-CM) - Status post total right knee replacement  THERAPY DIAG:  Stiffness of right knee, not elsewhere classified  Difficulty in walking, not elsewhere classified  Muscle weakness (generalized)  Localized edema  Acute pain of right knee  Rationale for Evaluation and Treatment: Rehabilitation  ONSET DATE: Surgery 07/02/23  SUBJECTIVE:   SUBJECTIVE STATEMENT:  L knee keeps swelling and popping, I think that its having a similar issue as the left and now swelling more bc I put more weight on it all the time.    From eval: Had my surgery on May 5th, also had HHPT which discharged last Friday. Still have the CPM but someone is coming to get it soon. Knee feels stiff. I need to get better with icing, still swollen. Working on getting grab bars in my shower, I do have a tub bench to use. There are steps in the house but I have a stair lift, this morning I was able to climb the steps. I think I may be allergic to the muscle relaxer, had a lot of panic attacks. Would be able to exercise down the line to help me lose weight   PERTINENT  HISTORY: See above  PAIN:  Are you having pain? Yes: NPRS scale: 4/10 Pain location: surgical knee  Pain description: throbbing  Aggravating factors: bending the knee, laying down is hard (not comfortable)  Relieving factors: ice, elevation, not bending knee   PRECAUTIONS: Fall  WEIGHT BEARING RESTRICTIONS: No  FALLS:  Has patient fallen in last 6 months? No, no close calls, no FOF   LIVING ENVIRONMENT: Lives with: lives with their spouse Lives in: House/apartment Stairs: 5 STE, has steep flight of steps  inside home but stair lift available for use  Has following equipment at home: Otho Blitz - 2 wheeled, Shower bench, and stair lift, cane   OCCUPATION: retiredHydrographic surveyor at [employer]   PATIENT GOALS: be able to move without pain, get more mobile   NEXT MD VISIT: Referring 08/15/23  OBJECTIVE:  Note: Objective measures were completed at Evaluation unless otherwise noted.    PATIENT SURVEYS:   Patient-Specific Activity Scoring Scheme  "0" represents "unable to perform." "10" represents "able to perform at prior level. 0 1 2 3 4 5 6 7 8 9  10 (Date and Score)   Activity Eval     1. Getting up from chairs  5      2. Picking something up  3    3. Getting comfortable in bed  1   4.    5.    Score 3    Total score = sum of the activity scores/number of activities Minimum detectable change (90%CI) for average score = 2 points Minimum detectable change (90%CI) for single activity score = 3 points    COGNITION: Overall cognitive status: Within functional limits for tasks assessed     SENSATION: Not tested  EDEMA:  Appropriate for post-op condition   LOWER EXTREMITY ROM:  Active ROM Right eval Left eval Rt 07/25/23 Rt 07/31/23  Hip flexion      Hip extension      Hip abduction      Hip adduction      Hip internal rotation      Hip external rotation      Knee flexion Supine heel slide 52*, HS curl in sitting 72*  Supine AA: 65 P: 78 Supine AA: 80   Knee  extension 9* supine with heel prop   A: -10 P: -8 AA: -5  Ankle dorsiflexion      Ankle plantarflexion      Ankle inversion      Ankle eversion       (Blank rows = not tested)  LOWER EXTREMITY MMT:  MMT Right eval Left eval  Hip flexion    Hip extension    Hip abduction    Hip adduction    Hip internal rotation    Hip external rotation    Knee flexion 3- 4+  Knee extension 4- 4+  Ankle dorsiflexion    Ankle plantarflexion    Ankle inversion    Ankle eversion     (Blank rows = not tested)    FUNCTIONAL TESTS:  Eval:  Timed up and go (TUG): 29.7 seconds RW 2 3 minute walk test: 247ft, RW   GAIT: Distance walked: 230ft Assistive device utilized: Environmental consultant - 2 wheeled Level of assistance: Modified independence Comments: limited heel toe pattern, antalgic gait, tends to lean heavily on RW  TREATMENT DATE:    08/03/23  Nustep L5x8 minutes with holds into knee flexion all four extremities   Knee flexion drives on TM belt 12x5 seconds  Bridges with progressive knee flexion 3x5  SAQs 0# 15x3 seconds  Knee flexion AAROM/overpressure at edge of mat table x10 cues to keep hip down when OP was applied   Vaso 34 degrees medium pressure LE elevated x10 min     07/31/23 TherEx: Nustep L5 x 6 min UEs/LEs (scooted seat fwd periodically) Feet on pball Knee flexion with strap x20 Hamstring curl iso x15 Knees extended bridge 2x10 SLR with strap to maintain terminal knee ext 2x10 Sidelying hip abd 2x10 TherActivities: Lunge on to 6" step 2x10 Fwd step tap 6" step 2x10 Modalities:  Vasopneumatic, medium compression 34 deg Rt LE elevated on bolster x 10 minutes  07/25/23 TherEx:  Nustep: Level 5 x 7 minutes UE/LE Heel/Toe raises x 20 c UE support Calf stretch on slant board x 3 holding 30 sec LAQ: x 10 no weight and then x 10 c 3#  Hamstring  curls 2 x 10 red TB Rt LE Seated SLR 2 x 10 Rt LE TherActivites:  Sit to stand: x 10 from chair 18 inches with UE support Standing marching single UE support  Manual:  Contract relax in sitting Passive Rt knee flexion in supine   Modalities:  Vasopneumatic, medium compression 34 deg Rt LE elevated on bolster x 10 minutes     07/24/23 Nustep L5 x 5 min UEs/LEs Standing heel/toe raise 2x10 Standing hip abd red TB 2x10 Standing hip ext red TB 2x10 Sit<>stand x10 Seated quad set 2x10 Seated LAQ red TB 2x10 Seated hamstring curl red TB 2x10 Seated knee flexion self stretch 2x30" Vaso medium pressure, 34 deg, medium compression R knee elevated x 10 min     PATIENT EDUCATION:  Education details: exam findings, POC, HEP  Person educated: Patient Education method: Explanation, Demonstration, and Handouts Education comprehension: verbalized understanding, returned demonstration, and needs further education  HOME EXERCISE PROGRAM: Access Code: [email or web address] URL: [email or web address] Date: 07/24/2023 Prepared by: Gellen April Erman [place]ward  Exercises - Standing Heel Raise with Support  - 1 x daily - 7 x weekly - 2 sets - 10 reps - Hip Abduction with Resistance Loop  - 1 x daily - 7 x weekly - 2 sets - 10 reps - Hip Extension with Resistance Loop  - 1 x daily - 7 x weekly - 2 sets - 10 reps - Sit to Stand with Arm Reach Toward Target  - 1 x daily - 7 x weekly - 1 sets - 10 reps - Seated Knee Extension with Resistance  - 1 x daily - 7 x weekly - 2 sets - 10 reps - Seated Hamstring Curls with Resistance  - 1 x daily - 7 x weekly - 2 sets - 10 reps - Seated Knee Flexion Stretch  - 2-3 x daily - 7 x weekly - 2-3 sets - 20-30 sec hold - Seated Quad Set  - 2-3 x daily - 7 x weekly - 2-3 sets - 10 reps - 3 sec hold  ASSESSMENT:  CLINICAL IMPRESSION:   Doing OK, still having a lot of swelling in both knees- hopeful to try to avoid surgery in the left. We kept working on ROM  and strength today as tolerated, encouraged up and moving (at least a couple laps in the house) every half hour. Finished with vaso per pt request. Will continue to challenge  her as appropriate.       OBJECTIVE IMPAIRMENTS: Abnormal gait, decreased activity tolerance, decreased balance, decreased knowledge of use of DME, decreased mobility, difficulty walking, decreased ROM, decreased strength, hypomobility, increased fascial restrictions, impaired flexibility, and pain.    GOALS: Goals reviewed with patient? No  SHORT TERM GOALS: Target date: 08/17/2023    Will be compliant with appropriate progressive HEP Goal status: MET 07/25/23   2. R knee AROM flexion to be at least and extension AROM to be at least 95* Goal status:  On-going 07/25/23   3. Will be independent with edema management strategies  Goal status: initial    4. Gait pattern to have normalized with LRAD  Goal status: initial   5. Will complete TUG test in 15 seconds or less with LRAD to show improved functional balance  Goal status: initial     LONG TERM GOALS: Target date: 09/14/2023    MMT to have improved by one grade all weak groups Goal status: initial   2. Knee AROM to be WNL and pain free Goal status: initial    3. Will be able to ascend and descend stairs reciprocally with no increase in pain Goal status: initial   4. Will be able to ambulate community distances and perform all household tasks with no increase in pain  Goal status: initial   5. PSFS to have improved by at least 3 points to show improved QOL and subjective improvement  Goal status: initial    PLAN:  PT FREQUENCY: 2x/week  PT DURATION: 8 weeks  PLANNED INTERVENTIONS: 97750- Physical Performance Testing, 97110-Therapeutic exercises, 97530- Therapeutic activity, 97112- Neuromuscular re-education, 97535- Self Care, 16109- Manual therapy, 97116- Gait training, 97016- Vasopneumatic device, Balance training, Stair training,  Taping, Dry Needling, and DME instructions  PLAN FOR NEXT SESSION: ROM and strength progressions as appropriate, gait training (walk without a/d), manual as desired, edema management PRN, careful with non-surgical knee   Terrel Ferries, PT, DPT 08/03/23 10:59 AM

## 2023-08-07 ENCOUNTER — Encounter: Payer: Self-pay | Admitting: Physical Therapy

## 2023-08-07 ENCOUNTER — Ambulatory Visit (INDEPENDENT_AMBULATORY_CARE_PROVIDER_SITE_OTHER): Admitting: Physical Therapy

## 2023-08-07 DIAGNOSIS — R600 Localized edema: Secondary | ICD-10-CM | POA: Diagnosis not present

## 2023-08-07 DIAGNOSIS — R262 Difficulty in walking, not elsewhere classified: Secondary | ICD-10-CM

## 2023-08-07 DIAGNOSIS — M6281 Muscle weakness (generalized): Secondary | ICD-10-CM

## 2023-08-07 DIAGNOSIS — M25661 Stiffness of right knee, not elsewhere classified: Secondary | ICD-10-CM | POA: Diagnosis not present

## 2023-08-07 DIAGNOSIS — M25561 Pain in right knee: Secondary | ICD-10-CM

## 2023-08-07 NOTE — Therapy (Signed)
 OUTPATIENT PHYSICAL THERAPY LOWER EXTREMITY TREATMENT   Patient Name: Marilyn [name] MRN: [id number] DOB:09-21-1955, 68 y.o., female Today's Date: 08/07/2023  END OF SESSION:  PT End of Session - 08/07/23 1156     Visit Number 6    Number of Visits 17    Date for PT Re-Evaluation 09/14/23    Authorization Type [employer]    Authorization Time Period 07/20/23 to 09/14/23    Progress Note Due on Visit 60    PT Start Time 1155   pt arrived late   PT Stop Time 1236    PT Time Calculation (min) 41 min    Activity Tolerance Patient tolerated treatment well    Behavior During Therapy [facility] for tasks assessed/performed               Past Medical History:  Diagnosis Date   Allergy    Anemia    hx   Anxiety    panic disorder   Breast cancer (HCC) 06/06/2011   bc  left breast 3 o'clock dx=invasive ductal ca uoqER/PR=positive   Cancer (HCC)    BREAST - left   Cigarette smoker    Colon polyp    Complication of anesthesia    DIFFICULTY AWAKENING, BP INCREASE AFTER CHOLECYSTECTEMY    COPD (chronic obstructive pulmonary disease) (HCC)    Depression    Diverticulosis of colon    DJD (degenerative joint disease)    Family history of breast cancer    Family history of colon cancer    Family history of pancreatic cancer    Family history of prostate cancer    Gallstones    GERD (gastroesophageal reflux disease)    Pt. denies having GERD. Unable to remove it.   History of anemia    History of bronchitis    History of sarcoidosis    Hyperlipidemia    no meds needed   Hypertension    Incisional breast wound    AT AGE 55 BILATERAL INCISION TO BREAST MADE   Leg swelling    Panic disorder    Personal history of radiation therapy    Pre-diabetes    S/P radiation therapy 08/17/11 - 09/28/11   LLQ - 50 Gy/25 Fractions with Boost of 10 Gy / 5 fractions   Sialoadenitis of submandibular gland 2016   Vitamin D  deficiency    Past Surgical History:  Procedure Laterality Date    BIOPSY BREAST     left breast  as a teenager  benign   BREAST BIOPSY Left 06/06/2011   BREAST EXCISIONAL BIOPSY Bilateral    BREAST LUMPECTOMY Left 2013   BREAST SURGERY  07/05/11   left breast lumpectomy with needle loc & axillary sln bx   BUNIONECTOMY  2011   bilateral by Dr Arnetha Langton   CHOLECYSTECTOMY  02/2019   COLONOSCOPY     polyp   cyst removed from right hand  1995   POLYPECTOMY     TONSILLECTOMY  1972   TOTAL ABDOMINAL HYSTERECTOMY  2000   Dr Andree Kayser   TOTAL KNEE ARTHROPLASTY Right 07/02/2023   Procedure: ARTHROPLASTY, KNEE, TOTAL;  Surgeon: Wes Hamman, MD;  Location: [facility] OR;  Service: Orthopedics;  Laterality: Right;   TRIGGER FINGER RELEASE  2008   Dr Donzella Galley   Patient Active Problem List   Diagnosis Date Noted   Status post total right knee replacement 07/02/2023   Primary osteoarthritis of right knee 07/01/2023   Hypercholesterolemia 05/10/2022   Trochanteric bursitis of right hip 05/10/2022  Preop exam for internal medicine 02/09/2022   Plantar flexed metatarsal bone of right foot 01/11/2022   Grief 09/28/2021   Leg swelling 04/18/2021   Incisional breast wound 04/18/2021   History of sarcoidosis 04/18/2021   History of bronchitis 04/18/2021   GERD (gastroesophageal reflux disease) 04/18/2021   Family history of prostate cancer 04/18/2021   Depression 04/18/2021   Complication of anesthesia 04/18/2021   Colon polyp 04/18/2021   Cigarette smoker 04/18/2021   Cancer (HCC) 04/18/2021   Coronary atherosclerosis 01/30/2021   Aortic atherosclerosis (HCC) 01/30/2021   Primary hypertension 01/14/2021   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   Knee pain, chronic 08/28/2019   Elevated BP without diagnosis of hypertension 08/28/2019   Callus of foot 08/28/2019   Cholelithiasis 12/26/2018   Genetic testing 12/26/2017   Family history of breast cancer    Family history of pancreatic cancer    Family history of colon cancer    Abdominal pain 07/28/2016   Morbid obesity  (HCC) 03/13/2016   Dyspnea 11/09/2015   Rash and nonspecific skin eruption 07/27/2015   Well adult exam 07/23/2014   Sialoadenitis 06/02/2014   Edema 07/07/2013   AB (asthmatic bronchitis) 06/23/2013   Anxiety    Malignant neoplasm of upper-outer quadrant of left breast in female, 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   Allergy 06/06/2011   COLONIC POLYPS 01/25/2010   DIVERTICULOSIS OF COLON 01/25/2010   DEGENERATIVE JOINT DISEASE 11/06/2008   PANIC DISORDER 07/23/2008   Sarcoidosis 10/13/2007   Vitamin D  deficiency 10/13/2007   ANEMIA 10/13/2007   BRONCHITIS 10/13/2007   Dyslipidemia 04/23/2007   Adjustment disorder with mixed anxiety and depressed mood 04/23/2007   Venous (peripheral) insufficiency 04/23/2007   HEADACHE 04/23/2007    PCP: Adelaide Holy MD   REFERRING PROVIDER: Sandie Cross, PA-C  REFERRING DIAG: Diagnosis [phone] (ICD-10-CM) - Status post total right knee replacement  THERAPY DIAG:  Stiffness of right knee, not elsewhere classified  Difficulty in walking, not elsewhere classified  Muscle weakness (generalized)  Localized edema  Acute pain of right knee  Rationale for Evaluation and Treatment: Rehabilitation  ONSET DATE: Surgery 07/02/23  SUBJECTIVE:   SUBJECTIVE STATEMENT: Working on bending her knee more; Lt knee is still causing problems.    From eval: Had my surgery on May 5th, also had HHPT which discharged last Friday. Still have the CPM but someone is coming to get it soon. Knee feels stiff. I need to get better with icing, still swollen. Working on getting grab bars in my shower, I do have a tub bench to use. There are steps in the house but I have a stair lift, this morning I was able to climb the steps. I think I may be allergic to the muscle relaxer, had a lot of panic attacks. Would be able to exercise down the line to help me lose weight   PERTINENT HISTORY: See above   PAIN:  Are you having pain? Yes: NPRS scale: 5  with exercise; none at rest/10 Pain location: surgical knee  Pain description: throbbing  Aggravating factors: bending the knee, laying down is hard (not comfortable)  Relieving factors: ice, elevation, not bending knee   PRECAUTIONS: Fall  WEIGHT BEARING RESTRICTIONS: No  FALLS:  Has patient fallen in last 6 months? No, no close calls, no FOF   LIVING ENVIRONMENT: Lives with: lives with their spouse Lives in: House/apartment Stairs: 5 STE, has steep flight of steps inside home but stair lift available for use  Has following  equipment at home: Otho Blitz - 2 wheeled, Shower bench, and stair lift, cane   OCCUPATION: retiredHydrographic surveyor at [employer]   PATIENT GOALS: be able to move without pain, get more mobile   NEXT MD VISIT: Referring 08/15/23  OBJECTIVE:  Note: Objective measures were completed at Evaluation unless otherwise noted.    PATIENT SURVEYS:   Patient-Specific Activity Scoring Scheme  "0" represents "unable to perform." "10" represents "able to perform at prior level. 0 1 2 3 4 5 6 7 8 9  10 (Date and Score)   Activity Eval     1. Getting up from chairs   5      2. Picking something up  3    3. Getting comfortable in bed  1   Score 3    Total score = sum of the activity scores/number of activities Minimum detectable change (90%CI) for average score = 2 points Minimum detectable change (90%CI) for single activity score = 3 points    COGNITION: Overall cognitive status: Within functional limits for tasks assessed     SENSATION: Not tested  EDEMA:  Appropriate for post-op condition   LOWER EXTREMITY ROM:  Active ROM Right eval Left eval Rt 07/25/23 Rt 07/31/23  Hip flexion      Hip extension      Hip abduction      Hip adduction      Hip internal rotation      Hip external rotation      Knee flexion Supine heel slide 52*, HS curl in sitting 72*  Supine AA: 65 P: 78 Supine AA: 80   Knee extension 9* supine with heel prop   A: -10 P: -8 AA: -5   Ankle dorsiflexion      Ankle plantarflexion      Ankle inversion      Ankle eversion       (Blank rows = not tested)  LOWER EXTREMITY MMT:  MMT Right eval Left eval  Knee flexion 3- 4+  Knee extension 4- 4+   (Blank rows = not tested)    FUNCTIONAL TESTS:  Eval:  Timed up and go (TUG): 29.7 seconds RW 2 3 minute walk test: 279ft, RW   GAIT: Distance walked: 246ft Assistive device utilized: Environmental consultant - 2 wheeled Level of assistance: Modified independence Comments: limited heel toe pattern, antalgic gait, tends to lean heavily on RW                                                                                                                                 TREATMENT DATE:  08/07/23 TherEx SciFit UE/LEs L1 seat 11 x 8 min AA Rt knee flexion 10 x 10 sec hold; LLE providing overpressure Seated LAQ on Rt 3# 3x10; 3 sec hold  Manual Seated Rt knee flexion with distraction and overpressure at end range   Gait Training Amb with SPC with supervision and min cues initially for sequencing -  pt safe with amb indoors and limited community amb with SPC  Modalities Vaso 34 degrees medium pressure Rt knee LE elevated x10 min  08/03/23 Nustep L5x8 minutes with holds into knee flexion all four extremities   Knee flexion drives on TM belt 12x5 seconds  Bridges with progressive knee flexion 3x5  SAQs 0# 15x3 seconds  Knee flexion AAROM/overpressure at edge of mat table x10 cues to keep hip down when OP was applied   Vaso 34 degrees medium pressure LE elevated x10 min     07/31/23 TherEx: Nustep L5 x 6 min UEs/LEs (scooted seat fwd periodically) Feet on pball Knee flexion with strap x20 Hamstring curl iso x15 Knees extended bridge 2x10 SLR with strap to maintain terminal knee ext 2x10 Sidelying hip abd 2x10 TherActivities: Lunge on to 6" step 2x10 Fwd step tap 6" step 2x10 Modalities:  Vasopneumatic, medium compression 34 deg Rt LE elevated on bolster x 10  minutes  07/25/23 TherEx:  Nustep: Level 5 x 7 minutes UE/LE Heel/Toe raises x 20 c UE support Calf stretch on slant board x 3 holding 30 sec LAQ: x 10 no weight and then x 10 c 3#  Hamstring curls 2 x 10 red TB Rt LE Seated SLR 2 x 10 Rt LE TherActivites:  Sit to stand: x 10 from chair 18 inches with UE support Standing marching single UE support  Manual:  Contract relax in sitting Passive Rt knee flexion in supine   Modalities:  Vasopneumatic, medium compression 34 deg Rt LE elevated on bolster x 10 minutes     07/24/23 Nustep L5 x 5 min UEs/LEs Standing heel/toe raise 2x10 Standing hip abd red TB 2x10 Standing hip ext red TB 2x10 Sit<>stand x10 Seated quad set 2x10 Seated LAQ red TB 2x10 Seated hamstring curl red TB 2x10 Seated knee flexion self stretch 2x30" Vaso medium pressure, 34 deg, medium compression R knee elevated x 10 min     PATIENT EDUCATION:  Education details: exam findings, POC, HEP  Person educated: Patient Education method: Explanation, Demonstration, and Handouts Education comprehension: verbalized understanding, returned demonstration, and needs further education  HOME EXERCISE PROGRAM: Access Code: [email or web address] URL: [email or web address] Date: 07/24/2023 Prepared by: Gellen April Erman [place]ward  Exercises - Standing Heel Raise with Support  - 1 x daily - 7 x weekly - 2 sets - 10 reps - Hip Abduction with Resistance Loop  - 1 x daily - 7 x weekly - 2 sets - 10 reps - Hip Extension with Resistance Loop  - 1 x daily - 7 x weekly - 2 sets - 10 reps - Sit to Stand with Arm Reach Toward Target  - 1 x daily - 7 x weekly - 1 sets - 10 reps - Seated Knee Extension with Resistance  - 1 x daily - 7 x weekly - 2 sets - 10 reps - Seated Hamstring Curls with Resistance  - 1 x daily - 7 x weekly - 2 sets - 10 reps - Seated Knee Flexion Stretch  - 2-3 x daily - 7 x weekly - 2-3 sets - 20-30 sec hold - Seated Quad Set  - 2-3 x daily - 7 x weekly -  2-3 sets - 10 reps - 3 sec hold  ASSESSMENT:  CLINICAL IMPRESSION: Pt tolerated session well today and overall progressing well with PT.  Flexion appears to largely be limited by swelling and pain at this time.  Will continue to benefit from PT to maximize function.   OBJECTIVE  IMPAIRMENTS: Abnormal gait, decreased activity tolerance, decreased balance, decreased knowledge of use of DME, decreased mobility, difficulty walking, decreased ROM, decreased strength, hypomobility, increased fascial restrictions, impaired flexibility, and pain.    GOALS: Goals reviewed with patient? No  SHORT TERM GOALS: Target date: 08/17/2023    Will be compliant with appropriate progressive HEP Goal status: MET 07/25/23   2. R knee AROM flexion to be at least and extension AROM to be at least 95* Goal status:  On-going 07/25/23   3. Will be independent with edema management strategies  Goal status: initial    4. Gait pattern to have normalized with LRAD  Goal status: initial   5. Will complete TUG test in 15 seconds or less with LRAD to show improved functional balance  Goal status: initial     LONG TERM GOALS: Target date: 09/14/2023    MMT to have improved by one grade all weak groups Goal status: initial   2. Knee AROM to be WNL and pain free Goal status: initial    3. Will be able to ascend and descend stairs reciprocally with no increase in pain Goal status: initial   4. Will be able to ambulate community distances and perform all household tasks with no increase in pain  Goal status: initial   5. PSFS to have improved by at least 3 points to show improved QOL and subjective improvement  Goal status: initial    PLAN:  PT FREQUENCY: 2x/week  PT DURATION: 8 weeks  PLANNED INTERVENTIONS: 97750- Physical Performance Testing, 97110-Therapeutic exercises, 97530- Therapeutic activity, 97112- Neuromuscular re-education, 97535- Self Care, 16109- Manual therapy, 97116- Gait  training, 97016- Vasopneumatic device, Balance training, Stair training, Taping, Dry Needling, and DME instructions  PLAN FOR NEXT SESSION: continue ROM and strength progressions as appropriate, gait training with SPC PRN, manual as desired, edema management PRN, careful with non-surgical knee   Marley Simmers, PT, DPT 08/07/23 12:33 PM

## 2023-08-08 ENCOUNTER — Encounter: Payer: Self-pay | Admitting: Rehabilitative and Restorative Service Providers"

## 2023-08-08 ENCOUNTER — Ambulatory Visit (INDEPENDENT_AMBULATORY_CARE_PROVIDER_SITE_OTHER): Payer: Self-pay | Admitting: Rehabilitative and Restorative Service Providers"

## 2023-08-08 DIAGNOSIS — M6281 Muscle weakness (generalized): Secondary | ICD-10-CM

## 2023-08-08 DIAGNOSIS — R262 Difficulty in walking, not elsewhere classified: Secondary | ICD-10-CM

## 2023-08-08 DIAGNOSIS — M25661 Stiffness of right knee, not elsewhere classified: Secondary | ICD-10-CM | POA: Diagnosis not present

## 2023-08-08 DIAGNOSIS — R600 Localized edema: Secondary | ICD-10-CM | POA: Diagnosis not present

## 2023-08-08 DIAGNOSIS — M25561 Pain in right knee: Secondary | ICD-10-CM

## 2023-08-08 NOTE — Therapy (Signed)
 OUTPATIENT PHYSICAL THERAPY LOWER EXTREMITY TREATMENT   Patient Name: Tuana McAdoo Rhee MRN: [id number] DOB:[birth year], 68 y.o., female Today's Date: 08/08/2023  END OF SESSION:  PT End of Session - 08/08/23 0945     Visit Number 7    Number of Visits 17    Date for PT Re-Evaluation 09/14/23    Authorization Type [employer] UMR    Authorization Time Period 07/20/23 to 09/14/23    Progress Note Due on Visit 60    PT Start Time 0936    PT Stop Time 1025    PT Time Calculation (min) 49 min    Activity Tolerance Patient tolerated treatment well    Behavior During Therapy [facility] for tasks assessed/performed                Past Medical History:  Diagnosis Date   Allergy    Anemia    hx   Anxiety    panic disorder   Breast cancer (HCC) 06/06/2011   bc  left breast 3 o'clock dx=invasive ductal ca uoqER/PR=positive   Cancer (HCC)    BREAST - left   Cigarette smoker    Colon polyp    Complication of anesthesia    DIFFICULTY AWAKENING, BP INCREASE AFTER CHOLECYSTECTEMY    COPD (chronic obstructive pulmonary disease) (HCC)    Depression    Diverticulosis of colon    DJD (degenerative joint disease)    Family history of breast cancer    Family history of colon cancer    Family history of pancreatic cancer    Family history of prostate cancer    Gallstones    GERD (gastroesophageal reflux disease)    Pt. denies having GERD. Unable to remove it.   History of anemia    History of bronchitis    History of sarcoidosis    Hyperlipidemia    no meds needed   Hypertension    Incisional breast wound    AT AGE 32 BILATERAL INCISION TO BREAST MADE   Leg swelling    Panic disorder    Personal history of radiation therapy    Pre-diabetes    S/P radiation therapy 08/17/11 - 09/28/11   LLQ - 50 Gy/25 Fractions with Boost of 10 Gy / 5 fractions   Sialoadenitis of submandibular gland 2016   Vitamin D  deficiency    Past Surgical History:  Procedure Laterality Date   BIOPSY BREAST      left breast  as a teenager  benign   BREAST BIOPSY Left 06/06/2011   BREAST EXCISIONAL BIOPSY Bilateral    BREAST LUMPECTOMY Left 2013   BREAST SURGERY  07/05/11   left breast lumpectomy with needle loc & axillary sln bx   BUNIONECTOMY  2011   bilateral by Dr Arnetha Langton   CHOLECYSTECTOMY  02/2019   COLONOSCOPY     polyp   cyst removed from right hand  1995   POLYPECTOMY     TONSILLECTOMY  1972   TOTAL ABDOMINAL HYSTERECTOMY  2000   Dr Andree Kayser   TOTAL KNEE ARTHROPLASTY Right 07/02/2023   Procedure: ARTHROPLASTY, KNEE, TOTAL;  Surgeon: Wes Hamman, MD;  Location: [facility] OR;  Service: Orthopedics;  Laterality: Right;   TRIGGER FINGER RELEASE  2008   Dr Donzella Galley   Patient Active Problem List   Diagnosis Date Noted   Status post total right knee replacement 07/02/2023   Primary osteoarthritis of right knee 07/01/2023   Hypercholesterolemia 05/10/2022   Trochanteric bursitis of right hip 05/10/2022   Preop  exam for internal medicine 02/09/2022   Plantar flexed metatarsal bone of right foot 01/11/2022   Grief 09/28/2021   Leg swelling 04/18/2021   Incisional breast wound 04/18/2021   History of sarcoidosis 04/18/2021   History of bronchitis 04/18/2021   GERD (gastroesophageal reflux disease) 04/18/2021   Family history of prostate cancer 04/18/2021   Depression 04/18/2021   Complication of anesthesia 04/18/2021   Colon polyp 04/18/2021   Cigarette smoker 04/18/2021   Cancer (HCC) 04/18/2021   Coronary atherosclerosis 01/30/2021   Aortic atherosclerosis (HCC) 01/30/2021   Primary hypertension 01/14/2021   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   Knee pain, chronic 08/28/2019   Elevated BP without diagnosis of hypertension 08/28/2019   Callus of foot 08/28/2019   Cholelithiasis 12/26/2018   Genetic testing 12/26/2017   Family history of breast cancer    Family history of pancreatic cancer    Family history of colon cancer    Abdominal pain 07/28/2016   Morbid obesity (HCC) 03/13/2016    Dyspnea 11/09/2015   Rash and nonspecific skin eruption 07/27/2015   Well adult exam 07/23/2014   Sialoadenitis 06/02/2014   Edema 07/07/2013   AB (asthmatic bronchitis) 06/23/2013   Anxiety    Malignant neoplasm of upper-outer quadrant of left breast in female, 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   Allergy 06/06/2011   COLONIC POLYPS 01/25/2010   DIVERTICULOSIS OF COLON 01/25/2010   DEGENERATIVE JOINT DISEASE 11/06/2008   PANIC DISORDER 07/23/2008   Sarcoidosis 10/13/2007   Vitamin D  deficiency 10/13/2007   ANEMIA 10/13/2007   BRONCHITIS 10/13/2007   Dyslipidemia 04/23/2007   Adjustment disorder with mixed anxiety and depressed mood 04/23/2007   Venous (peripheral) insufficiency 04/23/2007   HEADACHE 04/23/2007    PCP: Adelaide Holy MD   REFERRING PROVIDER: Sandie Cross, PA-C  REFERRING DIAG: Diagnosis [phone] (ICD-10-CM) - Status post total right knee replacement  THERAPY DIAG:  Stiffness of right knee, not elsewhere classified  Difficulty in walking, not elsewhere classified  Muscle weakness (generalized)  Localized edema  Acute pain of right knee  Rationale for Evaluation and Treatment: Rehabilitation  ONSET DATE: Surgery 07/02/23  SUBJECTIVE:   SUBJECTIVE STATEMENT: Pt indicated having tightness in knee.  Reported Lt knee swollen as well.     PERTINENT HISTORY: See above   PAIN:   NPRS scale: 5/10.   Pain location: surgical knee  Pain description: throbbing  Aggravating factors: bending the knee, laying down is hard (not comfortable)  Relieving factors: ice, elevation, not bending knee   PRECAUTIONS: Fall  WEIGHT BEARING RESTRICTIONS: No  FALLS:  Has patient fallen in last 6 months? No, no close calls, no FOF   LIVING ENVIRONMENT: Lives with: lives with their spouse Lives in: House/apartment Stairs: 5 STE, has steep flight of steps inside home but stair lift available for use  Has following equipment at home: Otho Blitz - 2  wheeled, Shower bench, and stair lift, cane   OCCUPATION: retiredHydrographic surveyor at [employer]   PATIENT GOALS: be able to move without pain, get more mobile   NEXT MD VISIT: Referring 08/15/23  OBJECTIVE:  Note: Objective measures were completed at Evaluation unless otherwise noted.   PATIENT SURVEYS:   Patient-Specific Activity Scoring Scheme  0 represents "unable to perform." 10 represents "able to perform at prior level. 0 1 2 3 4 5 6 7 8 9  10 (Date and Score)   Activity Eval     1. Getting up from chairs   5      2.  Picking something up  3    3. Getting comfortable in bed  1   Score 3    Total score = sum of the activity scores/number of activities Minimum detectable change (90%CI) for average score = 2 points Minimum detectable change (90%CI) for single activity score = 3 points    COGNITION: Overall cognitive status: Within functional limits for tasks assessed     SENSATION: Not tested  EDEMA:  Appropriate for post-op condition   LOWER EXTREMITY ROM:  Active ROM Right eval Left eval Rt 07/25/23 Rt 07/31/23  Hip flexion      Hip extension      Hip abduction      Hip adduction      Hip internal rotation      Hip external rotation      Knee flexion Supine heel slide 52*, HS curl in sitting 72*  Supine AA: 65 P: 78 Supine AA: 80   Knee extension 9* supine with heel prop   A: -10 P: -8 AA: -5  Ankle dorsiflexion      Ankle plantarflexion      Ankle inversion      Ankle eversion       (Blank rows = not tested)  LOWER EXTREMITY MMT:  MMT Right eval Left eval Right 08/08/2023  Knee flexion 3- 4+ 4/5  Knee extension 4- 4+ 4+/5   (Blank rows = not tested)    FUNCTIONAL TESTS:  Eval:  Timed up and go (TUG): 29.7 seconds RW 2 3 minute walk test: 281ft, RW   GAIT: Eval:  Distance walked: 238ft Assistive device utilized: Environmental consultant - 2 wheeled Level of assistance: Modified independence Comments: limited heel toe pattern, antalgic gait, tends to lean  heavily on RW                                                                                                                                               TREATMENT         DATE: 08/08/2023 Therex: UBE fwd LE 8 mins, lvl 1, seat 12 Incline gastroc stretch 30 sec x 3 bilateral  Seated Rt leg LAQ 2.5 lbs x 15 with pause in end range each direction c contralateral leg movement opposite.   Manual: Seated Rt knee flexion c mobilization c movement IR/distraction c contralateral leg moving opposite.   TherActivity: SPC use in clinic household distances < 100 ft with SBA and cues for techniques at time.  Leg press double leg x 15 75 lbs in available range, single leg Rt only x 15   Vaso: 10 mins medium compression Rt knee 34 degrees in elevation   TREATMENT         DATE: 08/07/23 TherEx SciFit UE/LEs L1 seat 11 x 8 min AA Rt knee flexion 10 x 10 sec hold; LLE providing overpressure Seated LAQ on Rt 3#  3x10; 3 sec hold  Manual Seated Rt knee flexion with distraction and overpressure at end range   Gait Training Amb with SPC with supervision and min cues initially for sequencing - pt safe with amb indoors and limited community amb with SPC  Modalities Vaso 34 degrees medium pressure Rt knee LE elevated x10 min  TREATMENT         DATE:08/03/23 Nustep L5x8 minutes with holds into knee flexion all four extremities   Knee flexion drives on TM belt 12x5 seconds  Bridges with progressive knee flexion 3x5  SAQs 0# 15x3 seconds  Knee flexion AAROM/overpressure at edge of mat table x10 cues to keep hip down when OP was applied   Vaso 34 degrees medium pressure LE elevated x10 min   TREATMENT         DATE:07/31/23 TherEx: Nustep L5 x 6 min UEs/LEs (scooted seat fwd periodically) Feet on pball Knee flexion with strap x20 Hamstring curl iso x15 Knees extended bridge 2x10 SLR with strap to maintain terminal knee ext 2x10 Sidelying hip abd 2x10 TherActivities: Lunge on to 6 step  2x10 Fwd step tap 6 step 2x10 Modalities:  Vasopneumatic, medium compression 34 deg Rt LE elevated on bolster x 10 minutes  PATIENT EDUCATION:  Education details: exam findings, POC, HEP  Person educated: Patient Education method: Programmer, multimedia, Demonstration, and Handouts Education comprehension: verbalized understanding, returned demonstration, and needs further education  HOME EXERCISE PROGRAM: Access Code: [email or web address] URL: [email or web address] Date: 07/24/2023 Prepared by: Gellen April Erman Hayward  Exercises - Standing Heel Raise with Support  - 1 x daily - 7 x weekly - 2 sets - 10 reps - Hip Abduction with Resistance Loop  - 1 x daily - 7 x weekly - 2 sets - 10 reps - Hip Extension with Resistance Loop  - 1 x daily - 7 x weekly - 2 sets - 10 reps - Sit to Stand with Arm Reach Toward Target  - 1 x daily - 7 x weekly - 1 sets - 10 reps - Seated Knee Extension with Resistance  - 1 x daily - 7 x weekly - 2 sets - 10 reps - Seated Hamstring Curls with Resistance  - 1 x daily - 7 x weekly - 2 sets - 10 reps - Seated Knee Flexion Stretch  - 2-3 x daily - 7 x weekly - 2-3 sets - 20-30 sec hold - Seated Quad Set  - 2-3 x daily - 7 x weekly - 2-3 sets - 10 reps - 3 sec hold  ASSESSMENT:  CLINICAL IMPRESSION: SPC use was improving but still needing some guidance reminders on cues.  Continued functional strengthening and mobility gains to help progress towards goals.    OBJECTIVE IMPAIRMENTS: Abnormal gait, decreased activity tolerance, decreased balance, decreased knowledge of use of DME, decreased mobility, difficulty walking, decreased ROM, decreased strength, hypomobility, increased fascial restrictions, impaired flexibility, and pain.    GOALS: Goals reviewed with patient? No  SHORT TERM GOALS: Target date: 08/17/2023    Will be compliant with appropriate progressive HEP Goal status: MET 07/25/23   2. Rt knee AROM flexion to be at least and extension AROM to be  at least 95* Goal status:  on going 08/08/2023    3. Will be independent with edema management strategies  Goal status: on going 08/08/2023    4. Gait pattern to have normalized with LRAD  Goal status: on going 08/08/2023   5. Will complete TUG test in 15 seconds or less with  LRAD to show improved functional balance  Goal status: on going 08/08/2023     LONG TERM GOALS: Target date: 09/14/2023    MMT to have improved by one grade all weak groups Goal status: on going 08/08/2023    2. Knee AROM to be WNL and pain free Goal status: on going 08/08/2023    3. Will be able to ascend and descend stairs reciprocally with no increase in pain Goal status: on going 08/08/2023    4. Will be able to ambulate community distances and perform all household tasks with no increase in pain  Goal status: on going 08/08/2023    5. PSFS to have improved by at least 3 points to show improved QOL and subjective improvement  Goal status: on going 08/08/2023    PLAN:  PT FREQUENCY: 2x/week  PT DURATION: 8 weeks  PLANNED INTERVENTIONS: 97750- Physical Performance Testing, 97110-Therapeutic exercises, 97530- Therapeutic activity, 97112- Neuromuscular re-education, 97535- Self Care, 60454- Manual therapy, 97116- Gait training, [phone]- Vasopneumatic device, Balance training, Stair training, Taping, Dry Needling, and DME instructions  PLAN FOR NEXT SESSION: Progression of strengthening for Rt leg (leg press again, etc.)    Bonna Bustard, PT, DPT, OCS, ATC 08/08/23  10:27 AM

## 2023-08-09 ENCOUNTER — Encounter: Payer: Self-pay | Admitting: Internal Medicine

## 2023-08-09 ENCOUNTER — Ambulatory Visit: Payer: No Typology Code available for payment source | Admitting: Internal Medicine

## 2023-08-09 VITALS — BP 118/74 | HR 97 | Temp 98.0°F | Ht 65.0 in | Wt 225.0 lb

## 2023-08-09 DIAGNOSIS — Z0000 Encounter for general adult medical examination without abnormal findings: Secondary | ICD-10-CM

## 2023-08-09 DIAGNOSIS — E559 Vitamin D deficiency, unspecified: Secondary | ICD-10-CM | POA: Diagnosis not present

## 2023-08-09 DIAGNOSIS — C50412 Malignant neoplasm of upper-outer quadrant of left female breast: Secondary | ICD-10-CM

## 2023-08-09 DIAGNOSIS — E785 Hyperlipidemia, unspecified: Secondary | ICD-10-CM

## 2023-08-09 DIAGNOSIS — G8929 Other chronic pain: Secondary | ICD-10-CM

## 2023-08-09 DIAGNOSIS — F419 Anxiety disorder, unspecified: Secondary | ICD-10-CM

## 2023-08-09 DIAGNOSIS — H25813 Combined forms of age-related cataract, bilateral: Secondary | ICD-10-CM | POA: Insufficient documentation

## 2023-08-09 DIAGNOSIS — M25569 Pain in unspecified knee: Secondary | ICD-10-CM | POA: Diagnosis not present

## 2023-08-09 DIAGNOSIS — H43393 Other vitreous opacities, bilateral: Secondary | ICD-10-CM | POA: Insufficient documentation

## 2023-08-09 DIAGNOSIS — Z170 Estrogen receptor positive status [ER+]: Secondary | ICD-10-CM

## 2023-08-09 DIAGNOSIS — H43812 Vitreous degeneration, left eye: Secondary | ICD-10-CM | POA: Insufficient documentation

## 2023-08-09 NOTE — Assessment & Plan Note (Signed)
 S/p R TKR 07/02/2023 - in PT Improving

## 2023-08-09 NOTE — Assessment & Plan Note (Signed)
 On Vit D

## 2023-08-09 NOTE — Assessment & Plan Note (Signed)
On Arimidex  2013: left-sided breast cancer treated with lumpectomy and radiation and is currently on oral antiestrogen therapy with Arimidex

## 2023-08-09 NOTE — Assessment & Plan Note (Signed)
Declined statins 12/22 coronary calcium CT score is 79 Start fish oil

## 2023-08-09 NOTE — Progress Notes (Signed)
 Subjective:  Patient ID: Marilyn [name], female    DOB: Jul 20, 1955  Age: 69 y.o. MRN: [id number]  CC: Annual Exam   HPI Marilyn [name] presents for a well exam S/p R TKR 07/02/2023 - in PT  Outpatient Medications Prior to Visit  Medication Sig Dispense Refill   albuterol  (VENTOLIN  HFA) 108 (90 Base) MCG/ACT inhaler Inhale 2 puffs into the lungs every 6 (six) hours as needed for wheezing or shortness of breath. 8 g 6   apixaban  (ELIQUIS ) 2.5 MG TABS tablet Take 1 tablet (2.5 mg total) by mouth 2 (two) times daily. post-op to prevent blood clots 60 tablet 0   budesonide -formoterol  (SYMBICORT ) 80-4.5 MCG/ACT inhaler Inhale 2 puffs into the lungs 2 (two) times daily. 1 each 11   busPIRone  (BUSPAR ) 30 MG tablet TAKE 1/2 TO 1 (ONE-HALF TO ONE) TABLET BY MOUTH TWICE DAILY (Patient taking differently: Take 15 mg by mouth daily.) 60 tablet 2   docusate sodium  (COLACE) 100 MG capsule Take 1 capsule (100 mg total) by mouth daily as needed. 30 capsule 2   HYDROcodone -acetaminophen  (NORCO/VICODIN) 5-325 MG tablet Take 1-2 tablets by mouth every 6 (six) hours as needed for moderate pain (pain score 4-6). 40 tablet 0   Menthol , Topical Analgesic, (BIOFREEZE) 10 % CREA Apply 1 application  topically daily as needed (pain).     methocarbamol  (ROBAXIN -750) 750 MG tablet Take 1 tablet (750 mg total) by mouth 3 (three) times daily as needed for muscle spasms. 30 tablet 2   ondansetron  (ZOFRAN ) 4 MG tablet Take 1 tablet (4 mg total) by mouth every 8 (eight) hours as needed for nausea or vomiting. 40 tablet 0   rosuvastatin  (CRESTOR ) 10 MG tablet Take 1 tablet (10 mg total) by mouth daily. 90 tablet 3   spironolactone  (ALDACTONE ) 50 MG tablet Take 1 tablet (50 mg total) by mouth daily. 90 tablet 3   Trolamine Salicylate (BLUE-EMU HEMP EX) Apply 1 Application topically daily as needed (pain).     oxyCODONE -acetaminophen  (PERCOCET) 5-325 MG tablet Take 1-2 tablets by mouth every 6 (six) hours as  needed. To be taken after surgery 40 tablet 0   No facility-administered medications prior to visit.    ROS: Review of Systems  Constitutional:  Negative for activity change, appetite change, chills, fatigue and unexpected weight change.  HENT:  Negative for congestion, mouth sores and sinus pressure.   Eyes:  Negative for visual disturbance.  Respiratory:  Negative for cough and chest tightness.   Gastrointestinal:  Negative for abdominal pain and nausea.  Genitourinary:  Negative for difficulty urinating, frequency and vaginal pain.  Musculoskeletal:  Positive for gait problem. Negative for back pain.  Skin:  Negative for pallor and rash.  Neurological:  Negative for dizziness, tremors, weakness, numbness and headaches.  Psychiatric/Behavioral:  Negative for confusion and sleep disturbance. The patient is nervous/anxious.    R knee w/post-op scar Using a walker   Objective:  BP 118/74   Pulse 97   Temp 98 F (36.7 C) (Oral)   Ht 5' 5 (1.651 m)   Wt 225 lb (102.1 kg)   SpO2 98%   BMI 37.44 kg/m   BP Readings from Last 3 Encounters:  08/09/23 118/74  07/03/23 (!) 119/59  06/22/23 (!) 152/73    Wt Readings from Last 3 Encounters:  08/09/23 225 lb (102.1 kg)  07/02/23 229 lb (103.9 kg)  06/22/23 229 lb 6.4 oz (104.1 kg)    Physical Exam Constitutional:  General: She is not in acute distress.    Appearance: She is well-developed. She is obese.  HENT:     Head: Normocephalic.     Right Ear: External ear normal.     Left Ear: External ear normal.     Nose: Nose normal.   Eyes:     General:        Right eye: No discharge.        Left eye: No discharge.     Conjunctiva/sclera: Conjunctivae normal.     Pupils: Pupils are equal, round, and reactive to light.   Neck:     Thyroid : No thyromegaly.     Vascular: No JVD.     Trachea: No tracheal deviation.   Cardiovascular:     Rate and Rhythm: Normal rate and regular rhythm.     Heart sounds: Normal heart  sounds.  Pulmonary:     Effort: No respiratory distress.     Breath sounds: No stridor. No wheezing.  Abdominal:     General: Bowel sounds are normal. There is no distension.     Palpations: Abdomen is soft. There is no mass.     Tenderness: There is no abdominal tenderness. There is no guarding or rebound.   Musculoskeletal:        General: No tenderness.     Cervical back: Normal range of motion and neck supple. No rigidity.  Lymphadenopathy:     Cervical: No cervical adenopathy.   Skin:    Findings: No erythema or rash.   Neurological:     Cranial Nerves: No cranial nerve deficit.     Motor: No abnormal muscle tone.     Coordination: Coordination normal.     Gait: Gait abnormal.     Deep Tendon Reflexes: Reflexes normal.   Psychiatric:        Behavior: Behavior normal.        Thought Content: Thought content normal.        Judgment: Judgment normal.   Using a walker Scar - clean  Lab Results  Component Value Date   WBC 10.2 06/22/2023   HGB 12.5 06/22/2023   HCT 40.3 06/22/2023   PLT 254 06/22/2023   GLUCOSE 89 06/22/2023   CHOL 155 05/25/2023   TRIG 56 05/25/2023   HDL 76 05/25/2023   LDLDIRECT 102 (H) 10/14/2021   LDLCALC 67 05/25/2023   ALT 17 05/25/2023   AST 16 05/25/2023   NA 139 06/22/2023   K 4.2 06/22/2023   CL 107 06/22/2023   CREATININE 0.67 06/22/2023   BUN 9 06/22/2023   CO2 25 06/22/2023   TSH 1.75 02/08/2023   HGBA1C 6.4 02/08/2023    DG Knee Right Port Result Date: 07/02/2023 CLINICAL DATA:  Postop. EXAM: PORTABLE RIGHT KNEE - 1-2 VIEW COMPARISON:  None Available. FINDINGS: Right knee arthroplasty in expected alignment. No periprosthetic lucency or fracture. There has been patellar resurfacing. Recent postsurgical change includes air and edema in the soft tissues and joint space. IMPRESSION: Right knee arthroplasty without immediate postoperative complication. Electronically Signed   By: Chadwick Colonel M.D.   On: 07/02/2023 11:55     Assessment & Plan:   Problem List Items Addressed This Visit     Vitamin D  deficiency   On Vit D      Dyslipidemia   Declined statins 12/22 coronary calcium  CT score is 79 Start fish oil       Malignant neoplasm of upper-outer quadrant of left breast in female, estrogen  receptor positive (HCC)   On Arimidex   2013: left-sided breast cancer treated with lumpectomy and radiation and is currently on oral antiestrogen therapy with Arimidex       Anxiety   On Buspar  po On Xanax  prn  Potential benefits of a long term benzodiazepines  use as well as potential risks  and complications were explained to the patient and were aknowledged.      Well adult exam   We discussed age appropriate health related issues, including available/recomended screening tests and vaccinations. We discussed a need for adhering to healthy diet and exercise. Labs were ordered to be later reviewed . All questions were answered. Trying to quit smoking Colon 2016,  2021 due in 02/2025 - Dr Leonia Raman Pneumovax Ordered  calc score CT 10/21 did not have it Labs [facility]      Knee pain, chronic - Primary   S/p R TKR 07/02/2023 - in PT Improving         No orders of the defined types were placed in this encounter.     Follow-up: Return in about 6 months (around 02/08/2024) for a follow-up visit.  Anitra Barn, MD

## 2023-08-09 NOTE — Assessment & Plan Note (Signed)
On Buspar po On Xanax prn  Potential benefits of a long term benzodiazepines  use as well as potential risks  and complications were explained to the patient and were aknowledged.

## 2023-08-09 NOTE — Assessment & Plan Note (Addendum)
 We discussed age appropriate health related issues, including available/recomended screening tests and vaccinations. We discussed a need for adhering to healthy diet and exercise. Labs were ordered to be later reviewed . All questions were answered. Trying to quit smoking Colon 2016,  2021 due in 02/2025 - Dr Leonia Raman Pneumovax Ordered  calc score CT 10/21 did not have it Labs [facility]

## 2023-08-13 ENCOUNTER — Encounter (INDEPENDENT_AMBULATORY_CARE_PROVIDER_SITE_OTHER): Payer: Self-pay | Admitting: Family Medicine

## 2023-08-13 ENCOUNTER — Ambulatory Visit (INDEPENDENT_AMBULATORY_CARE_PROVIDER_SITE_OTHER): Admitting: Family Medicine

## 2023-08-13 VITALS — BP 113/77 | HR 80 | Temp 98.1°F | Ht 64.5 in | Wt 219.0 lb

## 2023-08-13 DIAGNOSIS — E782 Mixed hyperlipidemia: Secondary | ICD-10-CM

## 2023-08-13 DIAGNOSIS — R5383 Other fatigue: Secondary | ICD-10-CM | POA: Diagnosis not present

## 2023-08-13 DIAGNOSIS — J438 Other emphysema: Secondary | ICD-10-CM | POA: Diagnosis not present

## 2023-08-13 DIAGNOSIS — E559 Vitamin D deficiency, unspecified: Secondary | ICD-10-CM

## 2023-08-13 DIAGNOSIS — R0602 Shortness of breath: Secondary | ICD-10-CM | POA: Diagnosis not present

## 2023-08-13 DIAGNOSIS — F5089 Other specified eating disorder: Secondary | ICD-10-CM

## 2023-08-13 DIAGNOSIS — Z170 Estrogen receptor positive status [ER+]: Secondary | ICD-10-CM | POA: Diagnosis not present

## 2023-08-13 DIAGNOSIS — Z1331 Encounter for screening for depression: Secondary | ICD-10-CM

## 2023-08-13 DIAGNOSIS — F411 Generalized anxiety disorder: Secondary | ICD-10-CM

## 2023-08-13 DIAGNOSIS — I10 Essential (primary) hypertension: Secondary | ICD-10-CM | POA: Diagnosis not present

## 2023-08-13 DIAGNOSIS — C50412 Malignant neoplasm of upper-outer quadrant of left female breast: Secondary | ICD-10-CM

## 2023-08-13 DIAGNOSIS — Z6837 Body mass index (BMI) 37.0-37.9, adult: Secondary | ICD-10-CM

## 2023-08-13 DIAGNOSIS — R739 Hyperglycemia, unspecified: Secondary | ICD-10-CM

## 2023-08-13 NOTE — Progress Notes (Signed)
 [name], D.O.  ABFM, ABOM Specializing in Clinical Bariatric Medicine Office located at: [address]  [place], [place]  [address]   Bariatric Medicine Visit  Dear Marilyn [name], Marilyn GAILS, MD   Thank you for referring Marilyn [name] to our clinic today for evaluation.  We performed a consultation to discuss her options for treatment and educate the patient on her disease state.  The following note includes my evaluation and treatment recommendations.   Please do not hesitate to reach out to me directly if you have any further concerns.    Assessment and Plan:   Labs obtained today will be reviewed at next OV.   Orders Placed This Encounter  Procedures   VITAMIN D  25 Hydroxy (Vit-D Deficiency, Fractures)   TSH   T4, free   T3   Lipid panel   Insulin , random   Hemoglobin A1c   Folate   Comprehensive metabolic panel with GFR   Vitamin B12   CBC with Differential/Platelet   EKG 12-Lead   There are no discontinued medications.   No orders of the defined types were placed in this encounter.   FOR THE DISEASE OF OBESITY: BMI 37.0-37.9, adult Morbid obesity -- Starting BMI; 37.1 Assessment & Plan: Recommended Dietary Goals Marilyn [name] is currently in the action stage of change. As such, her goal is to start our weight management plan.  She has agreed to implement: Category 1 Meal Plan with Lunch options   Behavioral Intervention We discussed the following Behavioral Modification Strategies today: increasing lean protein intake to established goals, decreasing simple carbohydrates , increasing vegetables, increasing lower glycemic fruits, increasing fiber rich foods, avoiding skipping meals, increasing water intake , keeping healthy foods at home, identifying sources and decreasing liquid calories, focusing on food with a 10:1 ratio of calories: grams of protein, and discussed pre-packaged healthier meals such as [employer] All Natural, Whole 30 chicken  meals, Longlife meal prep and Factor Meals etc  Additional resources provided today: Handout on CAT 1 meal plan  and Handout on CAT 1-2 lunch options  Evidence-based interventions for health behavior change were utilized today including the discussion of self monitoring techniques, problem-solving barriers and SMART goal setting techniques.    Pt will specifically work on: Implementation of meal plan for next visit.    Recommended Physical Activity Goals Marilyn [name] has been advised to work up to 150 minutes of moderate intensity aerobic activity a week and strengthening exercises 2-3 times per week for cardiovascular health, weight loss maintenance and preservation of muscle mass.   She has agreed to : maintain current level of activity.    Pharmacotherapy We discussed various medication options to help Marilyn [name] with her weight loss efforts and we both agreed to: Start with nutritional and behavioral strategies discussed today  ASSOCIATED CONDITIONS ADDRESSED TODAY: Fatigue Assessment & Plan: Marilyn [name] does feel that her weight is causing her energy to be lower than it should be. Fatigue may be related to obesity, depression or many other causes. she does not appear to have any red flag symptoms and this appears to most likely be related to her current lifestyle habits and dietary intake.  Labs will be ordered and reviewed with her at their next office visit in two weeks.  Orders: -     TSH -     T4, free -     T3 -     Lipid panel -     Insulin , random -     Folate -  Comprehensive metabolic panel with GFR -     Vitamin B12 -     CBC with Differential/Platelet -     EKG 12-Lead   Epworth sleepiness scale is 5 and appears to be within normal limits. Marilyn [name] admits to daytime somnolence and admits to waking up still tired. Patient has a history of symptoms of daytime fatigue, morning fatigue, and hypertension. Marilyn [name] generally gets about 6 hours of sleep per night, and states  that she has poor sleep quality. Snoring is present. Apneic episodes are present. She is established with a new pulmonologist. Per pt, there was no concern for OSA found. Advised pt to aim for 7-9 hours of sleep per night.    ECG: Performed and reviewed/interpreted independently. Sinus rhythm with PACs rate 69 bpm; reassuring without any acute abnormalities, will continue to monitor for symptoms She last followed up with Dr. Raford on 03/15/23, EKG was done at the time. EKG results showed NSR 77 bpm. Reviewed Dr. Skeeter full note from 03/15/23.    Shortness of breath on exertion Other emphysema (HCC) Assessment & Plan: Marilyn [name] does feel that she gets out of breath more easily than she used to when she exercises and seems to be worsening over time with weight gain.  This has gotten worse recently. Marilyn [name] denies shortness of breath at rest or orthopnea. Pt denies chest pain, dizziness, heart palpitations, or excessive diaphoresis or nausea with activity.  This is not new and is ongoing.  Marilyn [name]'s shortness of breath appears to be obesity related and exercise induced, as they do not appear to have any red flag symptoms/ concerns today. Also, this condition appears to be related to a state of poor cardiovascular conditioning. Obtain labs today and will be reviewed with her at their next office visit in two weeks.  She is being followed by her pulmonologist and PCP. Pt is Symbicort  daily for COPD and using albuterol  inhaler as needed. Symbicort  given by her PCP, Dr. Garald. She is having to use albuterol  more than twice weekly.Patient advised to follow up with PCP and specialist as instructed by them. Will defer a treatment plan to these providers.   Indirect Calorimeter completed today to help guide our dietary regimen. It shows a VO2 of 246 and a REE of 1699.  Her calculated basal metabolic rate is 8424 thus her resting energy expenditure is better than expected.  Patient agreed to work on  weight loss at this time.  As Shatia progresses through our weight loss program, we will gradually increase exercise as tolerated to treat her current condition.   If Marilyn [name] follows our recommendations and loses 5-10% of their weight without improvement of her shortness of breath or if at any time, symptoms become more concerning, they agree to urgently follow up with their PCP/ specialist for further consideration/ evaluation.   Marilyn [name] verbalizes agreement with this plan.    GAD (generalized anxiety disorder)- with emotional eating disorder Depression Screen  Assessment & Plan: Her PHQ-9 score was 4. Moods are stable today. Denies any SI/HI. Reports having panic attacks in the past, although overall her panic attacks have improved. Endorses hx of anxiety with emotional eating. Takes xanax  as needed and Buspar  15 mg BID. Was previously on oxycodone  and hydrocodone  prior to starting talk therapy. She will decline referral to Dr. Sharron at this time until she can evaluated by a specialist in the near future. Continue with current medication regimen as prescribed. Will continue monitoring condition as it relates to weight loss  journey.  Per New Patient Packet, she reports: - At least half the time her extra weight decreases her interest in doing things she previously used to enjoy.  - Her excess weight is making her feel tired/low energy at least half the time.  - Her moods occasionally affect her eating - On occasion her weight makes her feel depressed, down, or even hopeless - Emotional eating when stressed or sad   Malignant neoplasm of upper-outer quadrant of left breast in female, estrogen receptor positive Marilyn [name]) Assessment & Plan: Diagnosed in 05/2011. Was previously on Anastrozole  for several years. Pt is established with Morna Kendall NP and currently following up once annually. No acute concerns today. Continue to follow up with oncology care team, as instructed by them. Continue  treatment plan as advised by oncology team. Will continue monitoring alongside specialist and PCP as a relates to her weight loss journey.   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 Seen by HTN clinic with Dr. Raford. Currently taking Spironolactone  50 mg once daily.Good compliance and tolerance, no side effects reported today.  Was previously on Amlodipine . Has been monitoring blood pressures with Omron. Continue to follow up with cardiology care team as instructed by them.  Reviewed how blood pressures are expected to improve as she loses weight. Continue anti-hypertensive treatments as prescribed. Continue monitoring.    Mixed hyperlipidemia Assessment & Plan: Lab Results  Component Value Date   CHOL 155 05/25/2023   HDL 76 05/25/2023   LDLCALC 67 05/25/2023   LDLDIRECT 102 (H) 10/14/2021   TRIG 56 05/25/2023   CHOLHDL 2.0 05/25/2023   Compliant with Crestor  10 mg daily. Tolerating well with no SE. Pt has hx of coronary artery calcifications seen on CT on 01/2021 in which her coronary calcium  score was 79.6 and 82th percentile. CT also revealed atherosclerotic calcifications in the thoracic aorta. Continue with current med regimen. Follow up with cardiology care team as instructed by them. Continue monitoring alongside specialists and PCP as it relates to her weight loss.   Hyperglycemia Assessment & Plan: Patient has history of above goal A1c; 6.21 year ago and 6.46 months ago. Not currently being managed with medication. Diet/exercise approach. She reports occasionally skipping meals. Stressed importance of dietary and lifestyle modifications to result in weight loss as first line txmnt. Encourage patient to avoid skipping meals. Explained role of simple carbs and insulin  levels on hunger and cravings. Momoko will  continue to work on weight loss, exercise, via their meal plan we devised to help decrease the risk of progressing to diabetes. We will check A1c and fasting insulin  levels today and review results at her next OV.   Orders: - Check A1c    Vitamin D  deficiency Assessment & Plan: Lab Results  Component Value Date   VD25OH 34.46 07/27/2015   VD25OH 36.92 07/23/2014   VD25OH 44 03/22/2012   Not currently taking vitamin D  supplementation. She reports not taking 2,000 IUs daily since January. Was also previously on once weekly dose of vitamin D . Vit D levels have not been checked for some time. Will recheck today and review results at next OV.   Orders: - Check vitamin D    FOLLOW UP:   Follow up in 2 weeks. She was informed of the importance of frequent follow up visits to maximize her success with intensive lifestyle modifications for her  multiple health conditions.  Marilyn [name] is aware that we will review all of her lab results at our next visit.  She is aware that if anything is critical/ life threatening with the results, we will be contacting her via MyChart prior to the office visit to discuss management.    Chief Complaint:   OBESITY Queenie McAdoo Deltoro (MR# [id number]) is a pleasant 68 y.o. female who presents for evaluation and treatment of obesity and related comorbidities. Current BMI is Body mass index is 37.01 kg/m. Chavy McAdoo Kring has been struggling with her weight for many years and has been unsuccessful in either losing weight, maintaining weight loss, or reaching her healthy weight goal.  Dezyre McAdoo Oland is currently in the action stage of change and ready to dedicate time achieving and maintaining a healthier weight. Cambrie McAdoo Lazaro is interested in becoming our patient and working on intensive lifestyle modifications including (but not limited to) diet and exercise for weight loss.  Reyanna McAdoo Shetley is retired. Patient is  married to Milton (70 y/o).  She lives with her husband, Wadie.   Never had weight loss surgery.   Smoked for 25 years; quit 2022.   Does not exercise regularly.   Highest weight was 227 lbs (today).   Wants to be 150 in one year   Had foot surgery in 2013 and became less active and also retired   Has only tried carb counting and Atkins, which she noted was easy for her.   Eating out the home/fast foods 4 times a week  Craves chocolate and salty foods (chips, etc.).   Snacks on peanut butter with Ritz crackers and chips.   Skips breakfast most days a week, about 3-4 days per week. She adds she occasionally only eats one meal per day, stating she gets busy and doesn't get hungry.   Does not frequently snack between meals.   Rarely drinks sodas, but enjoys tea with sugar, coffee with creamer, milk, and sugar.   Has an alcoholic beverage about 2-3 times a month.  Worst food habit, not eating a balanced meal.    Subjective:   This is the patient's first visit at [facility].  The patient's NEW PATIENT PACKET that they filled out prior to today's office visit was reviewed at length and information from that paperwork was included within the following office visit note.    Included in the packet: current and past health history, medications, allergies, ROS, gynecologic history (women only), surgical history, family history, social history, weight history, weight loss surgery history (for those that have had weight loss surgery), nutritional evaluation, mood and food questionnaire along with a depression screening (PHQ9) on all patients, an Epworth questionnaire, sleep habits questionnaire, patient life and health improvement goals questionnaire. These will all be scanned into the patient's chart under the media tab.   Review of Systems: Please refer to new patient packet scanned into media. Pertinent positives were addressed with patient today.  Reviewed by  clinician on day of visit: allergies, medications, problem list, medical history, surgical history, family history, social history, and previous encounter notes.  During the visit, I independently reviewed the patient's EKG, bioimpedance scale results, and indirect calorimeter results. I used this information to tailor a meal plan for the patient that will help Erlinda McAdoo Dauenhauer to lose weight and will improve her obesity-related conditions going forward.  I performed a medically necessary appropriate examination and/or evaluation. I discussed the assessment and treatment plan  with the patient. The patient was provided an opportunity to ask questions and all were answered. The patient agreed with the plan and demonstrated an understanding of the instructions. Labs were ordered today (unless patient declined them) and will be reviewed with the patient at our next visit unless more critical results need to be addressed immediately. Clinical information was updated and documented in the EMR.    Objective:   PHYSICAL EXAM: Blood pressure 113/77, pulse 80, temperature 98.1 F (36.7 C), height 5' 4.5 (1.638 m), weight 219 lb (99.3 kg), SpO2 98%. Body mass index is 37.01 kg/m.  General: Well Developed, well nourished, and in no acute distress.  HEENT: Normocephalic, atraumatic; EOMI, sclerae are anicteric. Skin: Warm and dry, good turgor Chest:  Normal excursion, shape, no gross ABN Respiratory: No conversational dyspnea; speaking in full sentences NeuroM-Sk:  Normal gross ROM * 4 extremities  Psych: A and O *3, insight adequate, mood- full   Anthropometric Measurements Height: 5' 4.5 (1.638 m) Weight: 219 lb (99.3 kg) BMI (Calculated): 37.02 Weight at Last Visit: 0lb Weight Lost Since Last Visit: 0lb Weight Gained Since Last Visit: 0lb Starting Weight: 219lb Total Weight Loss (lbs): 0 lb (0 kg) Peak Weight: 227lb Waist Measurement : 40 inches   Body Composition  Body Fat %: 49.5  % Fat Mass (lbs): 108.8 lbs Muscle Mass (lbs): 105.4 lbs Total Body Water (lbs): 79.6 lbs Visceral Fat Rating : 16   Other Clinical Data RMR: 1699 Fasting: Yes Labs: Yes Today's Visit #: 1 Starting Date: 08/13/23 Comments: First Visit    DIAGNOSTIC DATA REVIEWED:  BMET    Component Value Date/Time   NA 139 06/22/2023 1400   NA 139 05/25/2023 1037   NA 138 11/04/2013 0929   K 4.2 06/22/2023 1400   K 3.8 11/04/2013 0929   CL 107 06/22/2023 1400   CL 107 03/22/2012 0853   CO2 25 06/22/2023 1400   CO2 23 11/04/2013 0929   GLUCOSE 89 06/22/2023 1400   GLUCOSE 100 11/04/2013 0929   GLUCOSE 99 03/22/2012 0853   BUN 9 06/22/2023 1400   BUN 14 05/25/2023 1037   BUN 12.1 11/04/2013 0929   CREATININE 0.67 06/22/2023 1400   CREATININE 0.8 11/04/2013 0929   CALCIUM  9.8 06/22/2023 1400   CALCIUM  9.6 11/04/2013 0929   GFRNONAA >60 06/22/2023 1400   GFRAA >60 07/14/2016 2305   Lab Results  Component Value Date   HGBA1C 6.4 02/08/2023   HGBA1C 6.2 02/09/2022   No results found for: INSULIN  Lab Results  Component Value Date   TSH 1.75 02/08/2023   CBC    Component Value Date/Time   WBC 10.2 06/22/2023 1400   RBC 5.32 (H) 06/22/2023 1400   HGB 12.5 06/22/2023 1400   HGB 12.2 11/04/2013 0928   HCT 40.3 06/22/2023 1400   HCT 38.9 11/04/2013 0928   PLT 254 06/22/2023 1400   PLT 247 11/04/2013 0928   MCV 75.8 (L) 06/22/2023 1400   MCV 74.7 (L) 11/04/2013 0928   MCH 23.5 (L) 06/22/2023 1400   MCHC 31.0 06/22/2023 1400   RDW 14.8 06/22/2023 1400   RDW 14.7 (H) 11/04/2013 0928   Iron Studies    Component Value Date/Time   IRON 74 12/25/2019 1415   TIBC 319 12/25/2019 1415   FERRITIN 110 12/25/2019 1415   IRONPCTSAT 23 12/25/2019 1415   Lipid Panel     Component Value Date/Time   CHOL 155 05/25/2023 1037   TRIG 56 05/25/2023 1037  HDL 76 05/25/2023 1037   CHOLHDL 2.0 05/25/2023 1037   CHOLHDL 3 02/08/2023 1127   VLDL 14.6 02/08/2023 1127   LDLCALC 67  05/25/2023 1037   LDLDIRECT 102 (H) 10/14/2021 1326   LDLDIRECT 118.9 12/18/2011 1138   Hepatic Function Panel     Component Value Date/Time   PROT 7.1 05/25/2023 1037   PROT 7.2 11/04/2013 0929   ALBUMIN 4.3 05/25/2023 1037   ALBUMIN 3.6 11/04/2013 0929   AST 16 05/25/2023 1037   AST 12 11/04/2013 0929   ALT 17 05/25/2023 1037   ALT 13 11/04/2013 0929   ALKPHOS 98 05/25/2023 1037   ALKPHOS 108 11/04/2013 0929   BILITOT 0.3 05/25/2023 1037   BILITOT 0.28 11/04/2013 0929   BILIDIR 0.0 11/12/2018 0912      Component Value Date/Time   TSH 1.75 02/08/2023 1127   Nutritional Lab Results  Component Value Date   VD25OH 34.46 07/27/2015   VD25OH 36.92 07/23/2014   VD25OH 44 03/22/2012    Attestation Statements:   I, Vernell Forest, acting as a medical scribe for Barnie Jenkins, DO., have compiled all relevant documentation for today's office visit on behalf of Barnie Jenkins, DO, while in the presence of [employer], DO.  I have reviewed the above documentation for accuracy and completeness, and I agree with the above. Barnie JINNY Jenkins, D.O.  The 21st Century Cures Act was signed into law in 2016 which includes the topic of electronic health records.  This provides immediate access to information in MyChart.  This includes consultation notes, operative notes, office notes, lab results and pathology reports.  If you have any questions about what you read please let us  know at your next visit so we can discuss your concerns and take corrective action if need be.  We are right here with you.

## 2023-08-14 ENCOUNTER — Ambulatory Visit (INDEPENDENT_AMBULATORY_CARE_PROVIDER_SITE_OTHER): Admitting: Physical Therapy

## 2023-08-14 ENCOUNTER — Encounter: Payer: Self-pay | Admitting: Physical Therapy

## 2023-08-14 DIAGNOSIS — M6281 Muscle weakness (generalized): Secondary | ICD-10-CM | POA: Diagnosis not present

## 2023-08-14 DIAGNOSIS — R262 Difficulty in walking, not elsewhere classified: Secondary | ICD-10-CM | POA: Diagnosis not present

## 2023-08-14 DIAGNOSIS — M25661 Stiffness of right knee, not elsewhere classified: Secondary | ICD-10-CM | POA: Diagnosis not present

## 2023-08-14 DIAGNOSIS — R600 Localized edema: Secondary | ICD-10-CM

## 2023-08-14 DIAGNOSIS — M25561 Pain in right knee: Secondary | ICD-10-CM

## 2023-08-14 NOTE — Therapy (Signed)
 OUTPATIENT PHYSICAL THERAPY LOWER EXTREMITY TREATMENT   Patient Name: Marilyn [name] Bianca MRN: [id number] DOB:May 28, 1955, 68 y.o., female Today's Date: 08/14/2023  END OF SESSION:  PT End of Session - 08/14/23 1142     Visit Number 8    Number of Visits 17    Date for PT Re-Evaluation 09/14/23    Authorization Type [employer] U[employer]    Authorization Time Period 07/20/23 to 09/14/23    Progress Note Due on Visit 60    PT Start Time 1140    PT Stop Time 1225    PT Time Calculation (min) 45 min    Activity Tolerance Patient tolerated treatment well    Behavior During Therapy [facility] for tasks assessed/performed              Past Medical History:  Diagnosis Date   Allergy    Anemia    hx   Anxiety    panic disorder   Breast cancer (HCC) 06/06/2011   bc  left breast 3 o'clock dx=invasive ductal ca uoqER/PR=positive   Cancer (HCC)    BREAST - left   Cigarette smoker    Colon polyp    Complication of anesthesia    DIFFICULTY AWAKENING, BP INCREASE AFTER CHOLECYSTECTEMY    COPD (chronic obstructive pulmonary disease) (HCC)    Depression    Diverticulosis of colon    DJD (degenerative joint disease)    Family history of breast cancer    Family history of colon cancer    Family history of pancreatic cancer    Family history of prostate cancer    Gallstones    GERD (gastroesophageal reflux disease)    Pt. denies having GERD. Unable to remove it.   History of anemia    History of bronchitis    History of sarcoidosis    Hyperlipidemia    no meds needed   Hypertension    Incisional breast wound    AT AGE 1 BILATERAL INCISION TO BREAST MADE   Joint pain    Leg swelling    Panic disorder    Personal history of radiation therapy    Pre-diabetes    S/P radiation therapy 08/17/11 - 09/28/11   LLQ - 50 Gy/25 Fractions with Boost of 10 Gy / 5 fractions   Sialoadenitis of submandibular gland 2016   Vitamin D  deficiency    Past Surgical History:  Procedure Laterality Date    BIOPSY BREAST     left breast  as a teenager  benign   BREAST BIOPSY Left 06/06/2011   BREAST EXCISIONAL BIOPSY Bilateral    BREAST LUMPECTOMY Left 2013   BREAST SURGERY  07/05/11   left breast lumpectomy with needle loc & axillary sln bx   BUNIONECTOMY  2011   bilateral by Dr Arnetha Langton   CHOLECYSTECTOMY  02/2019   COLONOSCOPY     polyp   cyst removed from right hand  1995   POLYPECTOMY     TONSILLECTOMY  1972   TOTAL ABDOMINAL HYSTERECTOMY  2000   Dr Andree Kayser   TOTAL KNEE ARTHROPLASTY Right 07/02/2023   Procedure: ARTHROPLASTY, KNEE, TOTAL;  Surgeon: Wes Hamman, MD;  Location: [facility] OR;  Service: Orthopedics;  Laterality: Right;   TRIGGER FINGER RELEASE  2008   Dr Donzella Galley   Patient Active Problem List   Diagnosis Date Noted   Status post total right knee replacement 07/02/2023   Primary osteoarthritis of right knee 07/01/2023   Hypercholesterolemia 05/10/2022   Trochanteric bursitis of right hip 05/10/2022  Preop exam for internal medicine 02/09/2022   Plantar flexed metatarsal bone of right foot 01/11/2022   Grief 09/28/2021   Leg swelling 04/18/2021   Incisional breast wound 04/18/2021   History of sarcoidosis 04/18/2021   History of bronchitis 04/18/2021   GERD (gastroesophageal reflux disease) 04/18/2021   Family history of prostate cancer 04/18/2021   Depression 04/18/2021   Complication of anesthesia 04/18/2021   Colon polyp 04/18/2021   Cigarette smoker 04/18/2021   Cancer (HCC) 04/18/2021   Coronary atherosclerosis 01/30/2021   Aortic atherosclerosis (HCC) 01/30/2021   Primary hypertension 01/14/2021   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   Knee pain, chronic 08/28/2019   Elevated BP without diagnosis of hypertension 08/28/2019   Callus of foot 08/28/2019   Cholelithiasis 12/26/2018   Genetic testing 12/26/2017   Family history of breast cancer    Family history of pancreatic cancer    Family history of colon cancer    Abdominal pain 07/28/2016   Morbid obesity  (HCC) 03/13/2016   Dyspnea 11/09/2015   Rash and nonspecific skin eruption 07/27/2015   Well adult exam 07/23/2014   Sialoadenitis 06/02/2014   Edema 07/07/2013   AB (asthmatic bronchitis) 06/23/2013   Anxiety    Malignant neoplasm of upper-outer quadrant of left breast in female, 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   Allergy 06/06/2011   COLONIC POLYPS 01/25/2010   DIVERTICULOSIS OF COLON 01/25/2010   DEGENERATIVE JOINT DISEASE 11/06/2008   PANIC DISORDER 07/23/2008   Sarcoidosis 10/13/2007   Vitamin D  deficiency 10/13/2007   ANEMIA 10/13/2007   BRONCHITIS 10/13/2007   Dyslipidemia 04/23/2007   Adjustment disorder with mixed anxiety and depressed mood 04/23/2007   Venous (peripheral) insufficiency 04/23/2007   HEADACHE 04/23/2007    PCP: Adelaide Holy MD   REFERRING PROVIDER: Sandie Cross, PA-C  REFERRING DIAG: Diagnosis [phone] (ICD-10-CM) - Status post total right knee replacement  THERAPY DIAG:  Stiffness of right knee, not elsewhere classified  Difficulty in walking, not elsewhere classified  Muscle weakness (generalized)  Localized edema  Acute pain of right knee  Rationale for Evaluation and Treatment: Rehabilitation  ONSET DATE: Surgery 07/02/23  SUBJECTIVE:   SUBJECTIVE STATEMENT: Pt arriving today reporting 4/10 pain in her Rt knee. Pt stating sitting is not bad, pain is worse with walking and bending her knee.   PERTINENT HISTORY: See above   PAIN:   NPRS scale: 4/10.   Pain location: surgical knee  Pain description: throbbing  Aggravating factors: bending the knee, laying down is hard (not comfortable)  Relieving factors: ice, elevation, not bending knee   PRECAUTIONS: Fall  WEIGHT BEARING RESTRICTIONS: No  FALLS:  Has patient fallen in last 6 months? No, no close calls, no FOF   LIVING ENVIRONMENT: Lives with: lives with their spouse Lives in: House/apartment Stairs: 5 STE, has steep flight of steps inside home but  stair lift available for use  Has following equipment at home: Otho Blitz - 2 wheeled, Shower bench, and stair lift, cane   OCCUPATION: retiredHydrographic surveyor at [employer]   PATIENT GOALS: be able to move without pain, get more mobile   NEXT MD VISIT: Referring 08/15/23  OBJECTIVE:  Note: Objective measures were completed at Evaluation unless otherwise noted.   PATIENT SURVEYS:   Patient-Specific Activity Scoring Scheme  0 represents "unable to perform." 10 represents "able to perform at prior level. 0 1 2 3 4 5 6 7 8 9  10 (Date and Score)   Activity Eval     1. Getting up  from chairs   5      2. Picking something up  3    3. Getting comfortable in bed  1   Score 3    Total score = sum of the activity scores/number of activities Minimum detectable change (90%CI) for average score = 2 points Minimum detectable change (90%CI) for single activity score = 3 points    COGNITION: Overall cognitive status: Within functional limits for tasks assessed     SENSATION: Not tested  EDEMA:  Appropriate for post-op condition   LOWER EXTREMITY ROM:  Active ROM Right eval Left eval Rt 07/25/23 Rt 07/31/23 Rt 08/14/23  Hip flexion       Hip extension       Hip abduction       Hip adduction       Hip internal rotation       Hip external rotation       Knee flexion Supine heel slide 52*, HS curl in sitting 72*  Supine AA: 65 P: 78 Supine AA: 80  Supine A: 85 P: 90  Knee extension 9* supine with heel prop   A: -10 P: -8 AA: -5 A: -7 P: -5  Ankle dorsiflexion       Ankle plantarflexion       Ankle inversion       Ankle eversion        (Blank rows = not tested)  LOWER EXTREMITY MMT:  MMT Right eval Left eval Right 08/08/2023  Knee flexion 3- 4+ 4/5  Knee extension 4- 4+ 4+/5   (Blank rows = not tested)    FUNCTIONAL TESTS:  Eval:  Timed up and go (TUG): 29.7 seconds RW 2 3 minute walk test: 258ft, RW   GAIT: Eval:  Distance walked: 242ft Assistive device  utilized: Environmental consultant - 2 wheeled Level of assistance: Modified independence Comments: limited heel toe pattern, antalgic gait, tends to lean heavily on RW                    TREATMENT         DATE: 08/14/2023 Therex: UBE: bike level 2 x 10 minutes, LE only seat 11 and progressed to 10 after a few minutes Standing gastroc stretch on slant board x 3 holding 30 sec Seated LAQ: 3# 2 x 10  holding 3 sec Manual:  Seated contract/relax with contralateral LE moving in opposite direction, Rt knee flexion mobs with IR/distraction TherActivites:  Leg Press: 75# bil LEs 3 x 10, Rt LE only: 37# 2 x 10  Sit to stand: x 10 with first 4 with UE support and last 6 with no UE support Step ups on 6 inch step: x 15 leading with Rt LE and lowering with Rt LE c single UE support Self Care:  Pt arriving today with a straight cane instead of her rolling walker. Pt's cane was adjusted to pt's height for comfort and to prevent left shoulder hiking.  Modalities:  Vasopneumatic: 34 deg, medium compression, LE elevated on bolster x 10 minutes  TREATMENT         DATE: 08/08/2023 Therex: UBE fwd LE 8 mins, lvl 1, seat 12 Incline gastroc stretch 30 sec x 3 bilateral  Seated Rt leg LAQ 2.5 lbs x 15 with pause in end range each direction c contralateral leg movement opposite.   Manual: Seated Rt knee flexion c mobilization c movement IR/distraction c contralateral leg moving opposite.   TherActivity: SPC use in clinic household distances < 100 ft with SBA and cues for techniques at time.  Leg press double leg x 15 75 lbs in available range, single leg Rt only x 15   Vaso: 10 mins medium compression Rt knee 34 degrees in elevation   TREATMENT         DATE: 08/07/23 TherEx SciFit UE/LEs L1 seat 11 x 8 min AA Rt knee flexion 10 x 10 sec hold; LLE providing overpressure Seated LAQ on Rt  3# 3x10; 3 sec hold  Manual Seated Rt knee flexion with distraction and overpressure at end range   Gait Training Amb with SPC with supervision and min cues initially for sequencing - pt safe with amb indoors and limited community amb with SPC  Modalities Vaso 34 degrees medium pressure Rt knee LE elevated x10 min     PATIENT EDUCATION:  Education details: exam findings, POC, HEP  Person educated: Patient Education method: Programmer, multimedia, Demonstration, and Handouts Education comprehension: verbalized understanding, returned demonstration, and needs further education  HOME EXERCISE PROGRAM: Access Code: [email or web address] URL: [email or web address] Date: 07/24/2023 Prepared by: Gellen April Erman Hayward  Exercises - Standing Heel Raise with Support  - 1 x daily - 7 x weekly - 2 sets - 10 reps - Hip Abduction with Resistance Loop  - 1 x daily - 7 x weekly - 2 sets - 10 reps - Hip Extension with Resistance Loop  - 1 x daily - 7 x weekly - 2 sets - 10 reps - Sit to Stand with Arm Reach Toward Target  - 1 x daily - 7 x weekly - 1 sets - 10 reps - Seated Knee Extension with Resistance  - 1 x daily - 7 x weekly - 2 sets - 10 reps - Seated Hamstring Curls with Resistance  - 1 x daily - 7 x weekly - 2 sets - 10 reps - Seated Knee Flexion Stretch  - 2-3 x daily - 7 x weekly - 2-3 sets - 20-30 sec hold - Seated Quad Set  - 2-3 x daily - 7 x weekly - 2-3 sets - 10 reps - 3 sec hold  ASSESSMENT:  CLINICAL IMPRESSION: Pt tolerating exercises well this session with no pain once pt's knee was loosened up with exercises.  Pt's passive ROM arch has improved from 5 to 90 degrees. Active arc of motion is 7 to 86 degrees. Recommending continued skilled PT interventions to maximize pt's function.     OBJECTIVE IMPAIRMENTS: Abnormal gait, decreased activity tolerance, decreased balance, decreased knowledge of use of DME, decreased mobility, difficulty walking, decreased ROM, decreased strength,  hypomobility, increased fascial restrictions, impaired flexibility, and pain.    GOALS: Goals reviewed with patient? No  SHORT TERM GOALS: Target date: 08/17/2023    Will be compliant with appropriate progressive HEP Goal status: MET 07/25/23   2. Rt knee AROM flexion to be at least and extension AROM to be at least 95* Goal status:  on going 6/71/2025    3. Will be independent with edema management strategies  Goal status: MET  08/14/2023 (  compression stockings, ice and elevation)   4. Gait pattern to have normalized with LRAD  Goal status: MET 08/14/23 c straight cane   5. Will complete TUG test in 15 seconds or less with LRAD to show improved functional balance  Goal status: on going 08/08/2023     LONG TERM GOALS: Target date: 09/14/2023    MMT to have improved by one grade all weak groups Goal status: on going 08/08/2023    2. Knee AROM to be WNL and pain free Goal status: on going 08/08/2023    3. Will be able to ascend and descend stairs reciprocally with no increase in pain Goal status: on going 08/08/2023    4. Will be able to ambulate community distances and perform all household tasks with no increase in pain  Goal status: on going 08/08/2023    5. PSFS to have improved by at least 3 points to show improved QOL and subjective improvement  Goal status: on going 08/08/2023    PLAN:  PT FREQUENCY: 2x/week  PT DURATION: 8 weeks  PLANNED INTERVENTIONS: 97750- Physical Performance Testing, 97110-Therapeutic exercises, 97530- Therapeutic activity, 97112- Neuromuscular re-education, 97535- Self Care, 16109- Manual therapy, [phone]- Gait training, [phone]- Vasopneumatic device, Balance training, Stair training, Taping, Dry Needling, and DME instructions  PLAN FOR NEXT SESSION:  Continue progression of strengthening for Rt leg (leg press again, etc.)    Jerrel Mor, PT MPT 08/14/23 12:25 PM   08/14/23  12:25 PM

## 2023-08-15 ENCOUNTER — Other Ambulatory Visit (INDEPENDENT_AMBULATORY_CARE_PROVIDER_SITE_OTHER)

## 2023-08-15 ENCOUNTER — Ambulatory Visit (INDEPENDENT_AMBULATORY_CARE_PROVIDER_SITE_OTHER): Admitting: Physician Assistant

## 2023-08-15 DIAGNOSIS — Z96651 Presence of right artificial knee joint: Secondary | ICD-10-CM

## 2023-08-15 LAB — LIPID PANEL
Chol/HDL Ratio: 2 ratio (ref 0.0–4.4)
Cholesterol, Total: 158 mg/dL (ref 100–199)
HDL: 78 mg/dL (ref >39)
LDL Chol Calc (NIH): 66 mg/dL (ref 0–99)
Triglycerides: 74 mg/dL (ref 0–149)
VLDL Cholesterol Cal: 14 mg/dL (ref 5–40)

## 2023-08-15 LAB — T4, FREE: Free T4: 1.25 ng/dL (ref 0.82–1.77)

## 2023-08-15 LAB — CBC WITH DIFFERENTIAL/PLATELET
Basophils Absolute: 0 10*3/uL (ref 0.0–0.2)
Basos: 1 %
EOS (ABSOLUTE): 0.3 10*3/uL (ref 0.0–0.4)
Eos: 3 %
Hematocrit: 40.1 % (ref 34.0–46.6)
Hemoglobin: 11.9 g/dL (ref 11.1–15.9)
Immature Grans (Abs): 0 10*3/uL (ref 0.0–0.1)
Immature Granulocytes: 0 %
Lymphocytes Absolute: 1.9 10*3/uL (ref 0.7–3.1)
Lymphs: 22 %
MCH: 23.2 pg — ABNORMAL LOW (ref 26.6–33.0)
MCHC: 29.7 g/dL — ABNORMAL LOW (ref 31.5–35.7)
MCV: 78 fL — ABNORMAL LOW (ref 79–97)
Monocytes Absolute: 0.7 10*3/uL (ref 0.1–0.9)
Monocytes: 8 %
Neutrophils Absolute: 5.9 10*3/uL (ref 1.4–7.0)
Neutrophils: 66 %
Platelets: 283 10*3/uL (ref 150–450)
RBC: 5.12 x10E6/uL (ref 3.77–5.28)
RDW: 14.4 % (ref 11.7–15.4)
WBC: 8.9 10*3/uL (ref 3.4–10.8)

## 2023-08-15 LAB — TSH: TSH: 1.42 u[IU]/mL (ref 0.450–4.500)

## 2023-08-15 LAB — COMPREHENSIVE METABOLIC PANEL WITH GFR
ALT: 13 IU/L (ref 0–32)
AST: 14 IU/L (ref 0–40)
Albumin: 4.5 g/dL (ref 3.9–4.9)
Alkaline Phosphatase: 104 IU/L (ref 44–121)
BUN/Creatinine Ratio: 15 (ref 12–28)
BUN: 11 mg/dL (ref 8–27)
Bilirubin Total: 0.4 mg/dL (ref 0.0–1.2)
CO2: 17 mmol/L — ABNORMAL LOW (ref 20–29)
Calcium: 10.1 mg/dL (ref 8.7–10.3)
Chloride: 101 mmol/L (ref 96–106)
Creatinine, Ser: 0.73 mg/dL (ref 0.57–1.00)
Globulin, Total: 2.9 g/dL (ref 1.5–4.5)
Glucose: 90 mg/dL (ref 70–99)
Potassium: 4.5 mmol/L (ref 3.5–5.2)
Sodium: 138 mmol/L (ref 134–144)
Total Protein: 7.4 g/dL (ref 6.0–8.5)
eGFR: 89 mL/min/{1.73_m2} (ref >59)

## 2023-08-15 LAB — VITAMIN B12: Vitamin B-12: 569 pg/mL (ref 232–1245)

## 2023-08-15 LAB — VITAMIN D 25 HYDROXY (VIT D DEFICIENCY, FRACTURES): Vit D, 25-Hydroxy: 27.8 ng/mL — ABNORMAL LOW (ref 30.0–100.0)

## 2023-08-15 LAB — FOLATE: Folate: 7.1 ng/mL (ref >3.0)

## 2023-08-15 LAB — INSULIN, RANDOM: INSULIN: 11.3 u[IU]/mL (ref 2.6–24.9)

## 2023-08-15 LAB — HEMOGLOBIN A1C
Est. average glucose Bld gHb Est-mCnc: 120 mg/dL
Hgb A1c MFr Bld: 5.8 % — ABNORMAL HIGH (ref 4.8–5.6)

## 2023-08-15 LAB — T3: T3, Total: 97 ng/dL (ref 71–180)

## 2023-08-15 MED ORDER — HYDROCODONE-ACETAMINOPHEN 5-325 MG PO TABS: 1.0000 | ORAL_TABLET | Freq: Two times a day (BID) | ORAL | 0 refills | Status: DC | PRN

## 2023-08-15 NOTE — Progress Notes (Addendum)
 Post-Op Visit Note   Patient: Marilyn [name]           Date of Birth: [birth year]           MRN: [id number] Visit Date: 08/15/2023 PCP: Genia Kettering, MD   Assessment & Plan:  Chief Complaint:  Chief Complaint  Patient presents with   Right Knee - Follow-up    Right total knee arthroplasty 07/02/2023   Visit Diagnoses:  1. Status post total right knee replacement     Plan: Patient is a pleasant 68 year old female who comes in today approximately 6 weeks status post right total knee replacement 07/02/2023.  She has been doing okay.  She has been taking Tylenol  and occasional Norco for pain.  She has been compliant taking a baby aspirin twice daily for DVT prophylaxis.  She has been in physical therapy twice a week and is ambulating with a single-point cane.  Examination of her right knee reveals a fully healed surgical scar without complication.  Range of motion from 0 to 90 degrees.  Stable to valgus varus stress.  She is neurovascularly intact distally.  At this point, I would like for her to really push things in physical therapy.  I refilled her Norco.  She may discontinue taking her aspirin from a DVT prophylaxis standpoint.  She will follow-up in 4 weeks for repeat evaluation with Dr. Christiane Cowing and to decide if she may need manipulation under anesthesia.  Follow-Up Instructions: Return in about 4 weeks (around 09/12/2023) for with Dr. Christiane Cowing to check ROM.   Orders:  Orders Placed This Encounter  Procedures   XR Knee 1-2 Views Right   Meds ordered this encounter  Medications   HYDROcodone -acetaminophen  (NORCO/VICODIN) 5-325 MG tablet    Sig: Take 1 tablet by mouth 2 (two) times daily as needed for moderate pain (pain score 4-6).    Dispense:  20 tablet    Refill:  0    Imaging: XR Knee 1-2 Views Right Result Date: 08/15/2023 Well-seated prosthesis without complication   PMFS History: Patient Active Problem List   Diagnosis Date Noted   Status post total right knee  replacement 07/02/2023   Primary osteoarthritis of right knee 07/01/2023   Hypercholesterolemia 05/10/2022   Trochanteric bursitis of right hip 05/10/2022   Preop exam for internal medicine 02/09/2022   Plantar flexed metatarsal bone of right foot 01/11/2022   Grief 09/28/2021   Leg swelling 04/18/2021   Incisional breast wound 04/18/2021   History of sarcoidosis 04/18/2021   History of bronchitis 04/18/2021   GERD (gastroesophageal reflux disease) 04/18/2021   Family history of prostate cancer 04/18/2021   Depression 04/18/2021   Complication of anesthesia 04/18/2021   Colon polyp 04/18/2021   Cigarette smoker 04/18/2021   Cancer (HCC) 04/18/2021   Coronary atherosclerosis 01/30/2021   Aortic atherosclerosis (HCC) 01/30/2021   Primary hypertension 01/14/2021   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   Knee pain, chronic 08/28/2019   Elevated BP without diagnosis of hypertension 08/28/2019   Callus of foot 08/28/2019   Cholelithiasis 12/26/2018   Genetic testing 12/26/2017   Family history of breast cancer    Family history of pancreatic cancer    Family history of colon cancer    Abdominal pain 07/28/2016   Morbid obesity (HCC) 03/13/2016   Dyspnea 11/09/2015   Rash and nonspecific skin eruption 07/27/2015   Well adult exam 07/23/2014   Sialoadenitis 06/02/2014   Edema 07/07/2013   AB (asthmatic bronchitis) 06/23/2013   Anxiety  Malignant neoplasm of upper-outer quadrant of left breast in female, 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   Allergy 06/06/2011   COLONIC POLYPS 01/25/2010   DIVERTICULOSIS OF COLON 01/25/2010   DEGENERATIVE JOINT DISEASE 11/06/2008   PANIC DISORDER 07/23/2008   Sarcoidosis 10/13/2007   Vitamin D  deficiency 10/13/2007   ANEMIA 10/13/2007   BRONCHITIS 10/13/2007   Dyslipidemia 04/23/2007   Adjustment disorder with mixed anxiety and depressed mood 04/23/2007   Venous (peripheral) insufficiency 04/23/2007   HEADACHE 04/23/2007   Past  Medical History:  Diagnosis Date   Allergy    Anemia    hx   Anxiety    panic disorder   Breast cancer (HCC) 06/06/2011   bc  left breast 3 o'clock dx=invasive ductal ca uoqER/PR=positive   Cancer (HCC)    BREAST - left   Cigarette smoker    Colon polyp    Complication of anesthesia    DIFFICULTY AWAKENING, BP INCREASE AFTER CHOLECYSTECTEMY    COPD (chronic obstructive pulmonary disease) (HCC)    Depression    Diverticulosis of colon    DJD (degenerative joint disease)    Family history of breast cancer    Family history of colon cancer    Family history of pancreatic cancer    Family history of prostate cancer    Gallstones    GERD (gastroesophageal reflux disease)    Pt. denies having GERD. Unable to remove it.   History of anemia    History of bronchitis    History of sarcoidosis    Hyperlipidemia    no meds needed   Hypertension    Incisional breast wound    AT AGE 37 BILATERAL INCISION TO BREAST MADE   Joint pain    Leg swelling    Panic disorder    Personal history of radiation therapy    Pre-diabetes    S/P radiation therapy 08/17/11 - 09/28/11   LLQ - 50 Gy/25 Fractions with Boost of 10 Gy / 5 fractions   Sialoadenitis of submandibular gland 2016   Vitamin D  deficiency     Family History  Problem Relation Age of Onset   Obesity Mother    Alcohol abuse Mother    Depression Mother    Hyperlipidemia Mother    Dementia Mother    Hypertension Mother    Anemia Mother    Other Mother        + H Pylori   Seizures Mother    Heart disease Mother    Heart disease Father    Hyperlipidemia Father    Hypertension Father    Prostate cancer Father        dx over 19   Colon cancer Father 6       diagnosed 2009   Bladder Cancer Father        dx over 41   Colon polyps Father    Hypertension Sister    Breast cancer Sister        dx in her 30s; mat half sister   Pancreatic cancer Sister 64       d. 91   Lung cancer Sister    Hypertension Sister     Sarcoidosis Sister        mat 1/2 sister   Multiple sclerosis Brother        #2   Hypertension Brother        #1   Hyperlipidemia Brother    Prostate cancer Brother        dx under  50   Dementia Maternal Aunt    Dementia Maternal Aunt    Seizures Maternal Uncle    Lung cancer Maternal Uncle    Lung cancer Paternal Aunt    Breast cancer Cousin        pat first cousin d. 63   Sarcoidosis Cousin        mat first cousin   Breast cancer Cousin        maternal 2nd cousins - distant   Brain cancer Other        benign   Esophageal cancer Neg Hx    Stomach cancer Neg Hx    Rectal cancer Neg Hx     Past Surgical History:  Procedure Laterality Date   BIOPSY BREAST     left breast  as a teenager  benign   BREAST BIOPSY Left 06/06/2011   BREAST EXCISIONAL BIOPSY Bilateral    BREAST LUMPECTOMY Left 2013   BREAST SURGERY  07/05/11   left breast lumpectomy with needle loc & axillary sln bx   BUNIONECTOMY  2011   bilateral by Dr Arnetha Langton   CHOLECYSTECTOMY  02/2019   COLONOSCOPY     polyp   cyst removed from right hand  1995   POLYPECTOMY     TONSILLECTOMY  1972   TOTAL ABDOMINAL HYSTERECTOMY  2000   Dr Andree Kayser   TOTAL KNEE ARTHROPLASTY Right 07/02/2023   Procedure: ARTHROPLASTY, KNEE, TOTAL;  Surgeon: Wes Hamman, MD;  Location: [facility] OR;  Service: Orthopedics;  Laterality: Right;   TRIGGER FINGER RELEASE  2008   Dr Donzella Galley   Social History   Occupational History   Occupation: retired  Tobacco Use   Smoking status: Former    Current packs/day: 0.00    Types: Cigarettes    Quit date: 12/28/2020    Years since quitting: 2.6   Smokeless tobacco: Never  Vaping Use   Vaping status: Never Used  Substance and Sexual Activity   Alcohol use: Yes    Alcohol/week: 0.0 - 1.0 standard drinks of alcohol    Comment: social   Drug use: No   Sexual activity: Not Currently    Birth control/protection: Surgical    Comment: menses age 53,hrt started  2000

## 2023-08-16 ENCOUNTER — Encounter: Payer: Self-pay | Admitting: Rehabilitative and Restorative Service Providers"

## 2023-08-16 ENCOUNTER — Ambulatory Visit (INDEPENDENT_AMBULATORY_CARE_PROVIDER_SITE_OTHER): Admitting: Rehabilitative and Restorative Service Providers"

## 2023-08-16 DIAGNOSIS — M25561 Pain in right knee: Secondary | ICD-10-CM

## 2023-08-16 DIAGNOSIS — R262 Difficulty in walking, not elsewhere classified: Secondary | ICD-10-CM | POA: Diagnosis not present

## 2023-08-16 DIAGNOSIS — M6281 Muscle weakness (generalized): Secondary | ICD-10-CM

## 2023-08-16 DIAGNOSIS — M25661 Stiffness of right knee, not elsewhere classified: Secondary | ICD-10-CM | POA: Diagnosis not present

## 2023-08-16 DIAGNOSIS — R600 Localized edema: Secondary | ICD-10-CM | POA: Diagnosis not present

## 2023-08-16 NOTE — Therapy (Signed)
 OUTPATIENT PHYSICAL THERAPY LOWER EXTREMITY TREATMENT   Patient Name: Marilyn [name] MRN: [id number] DOB:26-Aug-1955, 68 y.o., female Today's Date: 08/16/2023  END OF SESSION:  PT End of Session - 08/16/23 1148     Visit Number 9    Number of Visits 17    Date for PT Re-Evaluation 09/14/23    Authorization Type [employer]    Authorization Time Period 07/20/23 to 09/14/23    Progress Note Due on Visit 60    PT Start Time 1147    PT Stop Time 1240    PT Time Calculation (min) 53 min    Activity Tolerance Patient tolerated treatment well;No increased pain;Patient limited by pain    Behavior During Therapy [facility] for tasks assessed/performed           Past Medical History:  Diagnosis Date   Allergy    Anemia    hx   Anxiety    panic disorder   Breast cancer (HCC) 06/06/2011   bc  left breast 3 o'clock dx=invasive ductal ca uoqER/PR=positive   Cancer (HCC)    BREAST - left   Cigarette smoker    Colon polyp    Complication of anesthesia    DIFFICULTY AWAKENING, BP INCREASE AFTER CHOLECYSTECTEMY    COPD (chronic obstructive pulmonary disease) (HCC)    Depression    Diverticulosis of colon    DJD (degenerative joint disease)    Family history of breast cancer    Family history of colon cancer    Family history of pancreatic cancer    Family history of prostate cancer    Gallstones    GERD (gastroesophageal reflux disease)    Pt. denies having GERD. Unable to remove it.   History of anemia    History of bronchitis    History of sarcoidosis    Hyperlipidemia    no meds needed   Hypertension    Incisional breast wound    AT AGE 89 BILATERAL INCISION TO BREAST MADE   Joint pain    Leg swelling    Panic disorder    Personal history of radiation therapy    Pre-diabetes    S/P radiation therapy 08/17/11 - 09/28/11   LLQ - 50 Gy/25 Fractions with Boost of 10 Gy / 5 fractions   Sialoadenitis of submandibular gland 2016   Vitamin D  deficiency    Past Surgical  History:  Procedure Laterality Date   BIOPSY BREAST     left breast  as a teenager  benign   BREAST BIOPSY Left 06/06/2011   BREAST EXCISIONAL BIOPSY Bilateral    BREAST LUMPECTOMY Left 2013   BREAST SURGERY  07/05/11   left breast lumpectomy with needle loc & axillary sln bx   BUNIONECTOMY  2011   bilateral by Dr Marilyn [name]   CHOLECYSTECTOMY  02/2019   COLONOSCOPY     polyp   cyst removed from right hand  1995   POLYPECTOMY     TONSILLECTOMY  1972   TOTAL ABDOMINAL HYSTERECTOMY  2000   Dr Marilyn [name]   TOTAL KNEE ARTHROPLASTY Right 07/02/2023   Procedure: ARTHROPLASTY, KNEE, TOTAL;  Surgeon: Marilyn Hamman, MD;  Location: [facility] OR;  Service: Orthopedics;  Laterality: Right;   TRIGGER FINGER RELEASE  2008   Dr Marilyn [name]   Patient Active Problem List   Diagnosis Date Noted   Status post total right knee replacement 07/02/2023   Primary osteoarthritis of right knee 07/01/2023   Hypercholesterolemia 05/10/2022   Trochanteric bursitis of right  hip 05/10/2022   Preop exam for internal medicine 02/09/2022   Plantar flexed metatarsal bone of right foot 01/11/2022   Grief 09/28/2021   Leg swelling 04/18/2021   Incisional breast wound 04/18/2021   History of sarcoidosis 04/18/2021   History of bronchitis 04/18/2021   GERD (gastroesophageal reflux disease) 04/18/2021   Family history of prostate cancer 04/18/2021   Depression 04/18/2021   Complication of anesthesia 04/18/2021   Colon polyp 04/18/2021   Cigarette smoker 04/18/2021   Cancer (HCC) 04/18/2021   Coronary atherosclerosis 01/30/2021   Aortic atherosclerosis (HCC) 01/30/2021   Primary hypertension 01/14/2021   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   Knee pain, chronic 08/28/2019   Elevated BP without diagnosis of hypertension 08/28/2019   Callus of foot 08/28/2019   Cholelithiasis 12/26/2018   Genetic testing 12/26/2017   Family history of breast cancer    Family history of pancreatic cancer    Family history of colon cancer     Abdominal pain 07/28/2016   Morbid obesity (HCC) 03/13/2016   Dyspnea 11/09/2015   Rash and nonspecific skin eruption 07/27/2015   Well adult exam 07/23/2014   Sialoadenitis 06/02/2014   Edema 07/07/2013   AB (asthmatic bronchitis) 06/23/2013   Anxiety    Malignant neoplasm of upper-outer quadrant of left breast in female, 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   Allergy 06/06/2011   COLONIC POLYPS 01/25/2010   DIVERTICULOSIS OF COLON 01/25/2010   DEGENERATIVE JOINT DISEASE 11/06/2008   PANIC DISORDER 07/23/2008   Sarcoidosis 10/13/2007   Vitamin D  deficiency 10/13/2007   ANEMIA 10/13/2007   BRONCHITIS 10/13/2007   Dyslipidemia 04/23/2007   Adjustment disorder with mixed anxiety and depressed mood 04/23/2007   Venous (peripheral) insufficiency 04/23/2007   HEADACHE 04/23/2007    PCP: Marilyn Holy MD   REFERRING PROVIDER: Sandie Cross, PA-C  REFERRING DIAG: Diagnosis [phone] (ICD-10-CM) - Status post total right knee replacement  THERAPY DIAG:  Stiffness of right knee, not elsewhere classified  Difficulty in walking, not elsewhere classified  Muscle weakness (generalized)  Localized edema  Acute pain of right knee  Rationale for Evaluation and Treatment: Rehabilitation  ONSET DATE: Surgery 07/02/23  SUBJECTIVE:   SUBJECTIVE STATEMENT: Marilyn [name] notes a positive follow-up with Marilyn Cross PA-C.  I encouraged her to follow-up with her via MyChart due to an upcoming dentist appointment and the need for antibiotics before this appointment.  PERTINENT HISTORY: See above   PAIN:  NPRS scale: 0-5/10 (hydrocodone  before PT) Pain location: Right knee Pain description: Tightness Aggravating factors: Flexion Relieving factors: ice, elevation, not bending knee   PRECAUTIONS: Fall  WEIGHT BEARING RESTRICTIONS: No  FALLS:  Has patient fallen in last 6 months? No, no close calls, no FOF   LIVING ENVIRONMENT: Lives with: lives with their  spouse Lives in: House/apartment Stairs: 5 STE, has steep flight of steps inside home but stair lift available for use  Has following equipment at home: Marilyn [name] - 2 wheeled, Shower bench, and stair lift, cane   OCCUPATION: retiredHydrographic surveyor at [employer]   PATIENT GOALS: be able to move without pain, get more mobile   NEXT MD VISIT: 09/12/23 Dr. Christiane Cowing  OBJECTIVE:  Note: Objective measures were completed at Evaluation unless otherwise noted.   PATIENT SURVEYS:   Patient-Specific Activity Scoring Scheme  0 represents "unable to perform." 10 represents "able to perform at prior level. 0 1 2 3 4 5 6 7 8 9  10 (Date and Score)   Activity Eval   08/16/23  1.  Getting up from chairs   5    6  2. Picking something up  3  3  3. Getting comfortable in bed  1 5  Score 3 4.67   Total score = sum of the activity scores/number of activities Minimum detectable change (90%CI) for average score = 2 points Minimum detectable change (90%CI) for single activity score = 3 points  COGNITION: Overall cognitive status: Within functional limits for tasks assessed     SENSATION: Not tested  EDEMA:  Appropriate for post-op condition   LOWER EXTREMITY ROM:  Active ROM Right eval Left eval Rt 07/25/23 Rt 07/31/23 Rt 08/14/23 Right 08/16/2023  Hip flexion        Hip extension        Hip abduction        Hip adduction        Hip internal rotation        Hip external rotation        Knee flexion Supine heel slide 52*, HS curl in sitting 72*  Supine AA: 65 P: 78 Supine AA: 80  Supine A: 85 P: 90 Active 90  Knee extension 9* supine with heel prop   A: -10 P: -8 AA: -5 A: -7 P: -5 Active -2  Ankle dorsiflexion        Ankle plantarflexion        Ankle inversion        Ankle eversion         (Blank rows = not tested)  LOWER EXTREMITY MMT:  MMT Right eval Left eval Right 08/08/2023  Knee flexion 3- 4+ 4/5  Knee extension 4- 4+ 4+/5   (Blank rows = not tested)    FUNCTIONAL TESTS:   Eval:  Timed up and go (TUG): 29.7 seconds RW 2 3 minute walk test: 231ft, RW   GAIT: Eval:  Distance walked: 246ft Assistive device utilized: Environmental consultant - 2 wheeled Level of assistance: Modified independence Comments: limited heel toe pattern, antalgic gait, tends to lean heavily on RW                    TREATMENT         DATE: 08/16/2023 Recumbent bike Seat 7 for 5 minutes AAROM working into flexion and extension Quad sets with right heel prop 2 sets of 10 for 5 seconds Seated AAROM knee flexion 10 x 10 seconds (left pushes right into flexion) Tailgate knee flexion 2 minutes (emphasis on pulling into flexion)  Functional Activities: Double Leg Press (sit to stand, stairs) 75# 15 x stretch into flexion and extension for 5 seconds with slow eccentrics Single leg Press (Right): 31#   Vaso Right Knee 10 minutes High 34*   TREATMENT         DATE: 08/14/2023 Therex: UBE: bike level 2 x 10 minutes, LE only seat 11 and progressed to 10 after a few minutes Standing gastroc stretch on slant board x 3 holding 30 sec Seated LAQ: 3# 2 x 10  holding 3 sec Manual:  Seated contract/relax with contralateral LE moving in opposite direction, Rt knee flexion mobs with IR/distraction TherActivites:  Leg Press: 75# bil LEs 3 x 10, Rt LE only: 37# 2 x 10  Sit to stand: x 10 with first 4 with UE support and last 6 with no UE support Step ups on 6 inch step: x 15 leading with Rt LE and lowering with Rt LE c single UE support Self Care:  Pt arriving today  with a straight cane instead of her rolling walker. Pt's cane was adjusted to pt's height for comfort and to prevent left shoulder hiking.  Modalities:  Vasopneumatic: 34 deg, medium compression, LE elevated on bolster x 10 minutes                                                                                                                              TREATMENT         DATE: 08/08/2023 Therex: UBE fwd LE 8 mins, lvl 1, seat 12 Incline gastroc  stretch 30 sec x 3 bilateral  Seated Rt leg LAQ 2.5 lbs x 15 with pause in end range each direction c contralateral leg movement opposite.   Manual: Seated Rt knee flexion c mobilization c movement IR/distraction c contralateral leg moving opposite.   TherActivity: SPC use in clinic household distances < 100 ft with SBA and cues for techniques at time.  Leg press double leg x 15 75 lbs in available range, single leg Rt only x 15   Vaso: 10 mins medium compression Rt knee 34 degrees in elevation    PATIENT EDUCATION:  Education details: exam findings, POC, HEP  Person educated: Patient Education method: Explanation, Demonstration, and Handouts Education comprehension: verbalized understanding, returned demonstration, and needs further education  HOME EXERCISE PROGRAM: Access Code: [email or web address] URL: [email or web address] Date: 08/16/2023 Prepared by: Terral Ferrari  Exercises - Standing Heel Raise with Support  - 1 x daily - 7 x weekly - 2 sets - 10 reps - Hip Abduction with Resistance Loop  - 1 x daily - 7 x weekly - 2 sets - 10 reps - Hip Extension with Resistance Loop  - 1 x daily - 7 x weekly - 2 sets - 10 reps - Sit to Stand with Arm Reach Toward Target  - 1 x daily - 7 x weekly - 1 sets - 10 reps - Seated Knee Extension with Resistance  - 1 x daily - 7 x weekly - 2 sets - 10 reps - Seated Hamstring Curls with Resistance  - 1 x daily - 7 x weekly - 2 sets - 10 reps - Seated Knee Flexion Stretch  - 2-3 x daily - 7 x weekly - 2-3 sets - 20-30 sec hold - Seated Quad Set  - 2-3 x daily - 7 x weekly - 2-3 sets - 10 reps - 3 sec hold - Seated Knee Flexion AAROM  - 3 x daily - 7 x weekly - 1 sets - 10 reps - 10 seconds hold - Supine Quadricep Sets  - 3 x daily - 7 x weekly - 2 sets - 10 reps - 5 second hold  ASSESSMENT:  CLINICAL IMPRESSION: AROM was assessed at 0 - 2 - 90 degrees today.  Marilyn [name] was close to pedaling a complete revolution on the bike and I expect she  will be able to next week.  Edema control, flexion AROM, extension AROM and quadriceps  strength remain the emphasis with her home exercises.  OBJECTIVE IMPAIRMENTS: Abnormal gait, decreased activity tolerance, decreased balance, decreased knowledge of use of DME, decreased mobility, difficulty walking, decreased ROM, decreased strength, hypomobility, increased fascial restrictions, impaired flexibility, and pain.    GOALS: Goals reviewed with patient? No  SHORT TERM GOALS: Target date: 08/17/2023    Will be compliant with appropriate progressive HEP Goal status: MET 07/25/23   2. Rt knee AROM flexion to be at least and extension AROM to be at least 95* Goal status:  on going 08/16/2023    3. Will be independent with edema management strategies  Goal status: MET  08/14/2023 (compression stockings, ice and elevation)   4. Gait pattern to have normalized with LRAD  Goal status: MET 08/14/23 c straight cane   5. Will complete TUG test in 15 seconds or less with LRAD to show improved functional balance  Goal status: on going 08/08/2023     LONG TERM GOALS: Target date: 09/14/2023    MMT to have improved by one grade all weak groups Goal status: on going 08/08/2023    2. Knee AROM to be WNL and pain free Goal status: on going 08/08/2023    3. Will be able to ascend and descend stairs reciprocally with no increase in pain Goal status: on going 08/08/2023    4. Will be able to ambulate community distances and perform all household tasks with no increase in pain  Goal status: on going 08/08/2023    5. PSFS to have improved by at least 3 points to show improved QOL and subjective improvement  Goal status: on going 08/08/2023    PLAN:  PT FREQUENCY: 2x/week  PT DURATION: 8 weeks  PLANNED INTERVENTIONS: 97750- Physical Performance Testing, 97110-Therapeutic exercises, 97530- Therapeutic activity, 97112- Neuromuscular re-education, 97535- Self Care, 44034- Manual therapy, 97116-  Gait training, 97016- Vasopneumatic device, Balance training, Stair training, Taping, Dry Needling, and DME instructions  PLAN FOR NEXT SESSION:  Continue progression of quadriceps strengthening for Rt leg (leg press again, etc.) while addressing flexion AROM.  Balance and functional progressions as appropriate.   Joli Neas, PT MPT 08/16/23 12:39 PM   08/16/23  12:39 PM

## 2023-08-20 ENCOUNTER — Ambulatory Visit (INDEPENDENT_AMBULATORY_CARE_PROVIDER_SITE_OTHER): Admitting: Physical Therapy

## 2023-08-20 ENCOUNTER — Encounter: Payer: Self-pay | Admitting: Physical Therapy

## 2023-08-20 DIAGNOSIS — M25661 Stiffness of right knee, not elsewhere classified: Secondary | ICD-10-CM | POA: Diagnosis not present

## 2023-08-20 DIAGNOSIS — M25561 Pain in right knee: Secondary | ICD-10-CM

## 2023-08-20 DIAGNOSIS — R262 Difficulty in walking, not elsewhere classified: Secondary | ICD-10-CM

## 2023-08-20 DIAGNOSIS — R600 Localized edema: Secondary | ICD-10-CM

## 2023-08-20 DIAGNOSIS — M6281 Muscle weakness (generalized): Secondary | ICD-10-CM | POA: Diagnosis not present

## 2023-08-20 NOTE — Therapy (Signed)
 OUTPATIENT PHYSICAL THERAPY LOWER EXTREMITY TREATMENT   Patient Name: Marilyn [name] MRN: [id number] DOB:01/21/1956, 68 y.o., female Today's Date: 08/20/2023  END OF SESSION:  PT End of Session - 08/20/23 1102     Visit Number 10    Number of Visits 17    Date for PT Re-Evaluation 09/14/23    Authorization Type [employer]    Authorization Time Period 07/20/23 to 09/14/23    Progress Note Due on Visit 60    PT Start Time 1100    PT Stop Time 1155    PT Time Calculation (min) 55 min    Activity Tolerance Patient tolerated treatment well;No increased pain;Patient limited by pain    Behavior During Therapy [facility] for tasks assessed/performed            Past Medical History:  Diagnosis Date   Allergy    Anemia    hx   Anxiety    panic disorder   Breast cancer (HCC) 06/06/2011   bc  left breast 3 o'clock dx=invasive ductal ca uoqER/PR=positive   Cancer (HCC)    BREAST - left   Cigarette smoker    Colon polyp    Complication of anesthesia    DIFFICULTY AWAKENING, BP INCREASE AFTER CHOLECYSTECTEMY    COPD (chronic obstructive pulmonary disease) (HCC)    Depression    Diverticulosis of colon    DJD (degenerative joint disease)    Family history of breast cancer    Family history of colon cancer    Family history of pancreatic cancer    Family history of prostate cancer    Gallstones    GERD (gastroesophageal reflux disease)    Pt. denies having GERD. Unable to remove it.   History of anemia    History of bronchitis    History of sarcoidosis    Hyperlipidemia    no meds needed   Hypertension    Incisional breast wound    AT AGE 12 BILATERAL INCISION TO BREAST MADE   Joint pain    Leg swelling    Panic disorder    Personal history of radiation therapy    Pre-diabetes    S/P radiation therapy 08/17/11 - 09/28/11   LLQ - 50 Gy/25 Fractions with Boost of 10 Gy / 5 fractions   Sialoadenitis of submandibular gland 2016   Vitamin D  deficiency    Past Surgical  History:  Procedure Laterality Date   BIOPSY BREAST     left breast  as a teenager  benign   BREAST BIOPSY Left 06/06/2011   BREAST EXCISIONAL BIOPSY Bilateral    BREAST LUMPECTOMY Left 2013   BREAST SURGERY  07/05/11   left breast lumpectomy with needle loc & axillary sln bx   BUNIONECTOMY  2011   bilateral by Dr Lea   CHOLECYSTECTOMY  02/2019   COLONOSCOPY     polyp   cyst removed from right hand  1995   POLYPECTOMY     TONSILLECTOMY  1972   TOTAL ABDOMINAL HYSTERECTOMY  2000   Dr Rosalynn   TOTAL KNEE ARTHROPLASTY Right 07/02/2023   Procedure: ARTHROPLASTY, KNEE, TOTAL;  Surgeon: Jerri Kay HERO, MD;  Location: [facility] OR;  Service: Orthopedics;  Laterality: Right;   TRIGGER FINGER RELEASE  2008   Dr Sissy   Patient Active Problem List   Diagnosis Date Noted   Status post total right knee replacement 07/02/2023   Primary osteoarthritis of right knee 07/01/2023   Hypercholesterolemia 05/10/2022   Trochanteric bursitis of  right hip 05/10/2022   Preop exam for internal medicine 02/09/2022   Plantar flexed metatarsal bone of right foot 01/11/2022   Grief 09/28/2021   Leg swelling 04/18/2021   Incisional breast wound 04/18/2021   History of sarcoidosis 04/18/2021   History of bronchitis 04/18/2021   GERD (gastroesophageal reflux disease) 04/18/2021   Family history of prostate cancer 04/18/2021   Depression 04/18/2021   Complication of anesthesia 04/18/2021   Colon polyp 04/18/2021   Cigarette smoker 04/18/2021   Cancer (HCC) 04/18/2021   Coronary atherosclerosis 01/30/2021   Aortic atherosclerosis (HCC) 01/30/2021   Primary hypertension 01/14/2021   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   Knee pain, chronic 08/28/2019   Elevated BP without diagnosis of hypertension 08/28/2019   Callus of foot 08/28/2019   Cholelithiasis 12/26/2018   Genetic testing 12/26/2017   Family history of breast cancer    Family history of pancreatic cancer    Family history of colon cancer     Abdominal pain 07/28/2016   Morbid obesity (HCC) 03/13/2016   Dyspnea 11/09/2015   Rash and nonspecific skin eruption 07/27/2015   Well adult exam 07/23/2014   Sialoadenitis 06/02/2014   Edema 07/07/2013   AB (asthmatic bronchitis) 06/23/2013   Anxiety    Malignant neoplasm of upper-outer quadrant of left breast in female, 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   Allergy 06/06/2011   COLONIC POLYPS 01/25/2010   DIVERTICULOSIS OF COLON 01/25/2010   DEGENERATIVE JOINT DISEASE 11/06/2008   PANIC DISORDER 07/23/2008   Sarcoidosis 10/13/2007   Vitamin D  deficiency 10/13/2007   ANEMIA 10/13/2007   BRONCHITIS 10/13/2007   Dyslipidemia 04/23/2007   Adjustment disorder with mixed anxiety and depressed mood 04/23/2007   Venous (peripheral) insufficiency 04/23/2007   HEADACHE 04/23/2007    PCP: Garald Scrape MD   REFERRING PROVIDER: Jule Ronal CROME, PA-C  REFERRING DIAG: Diagnosis [phone] (ICD-10-CM) - Status post total right knee replacement  THERAPY DIAG:  Stiffness of right knee, not elsewhere classified  Difficulty in walking, not elsewhere classified  Muscle weakness (generalized)  Localized edema  Acute pain of right knee  Rationale for Evaluation and Treatment: Rehabilitation  ONSET DATE: Surgery 07/02/23  SUBJECTIVE:   SUBJECTIVE STATEMENT: She has been trying to do exercises as recommended.    PERTINENT HISTORY: See above   PAIN:  (2 tylenol  / no hydrocodone  before PT) NPRS scale: at rest 0/10 standing & walking 1/10 and trying bend  0-6/10  Pain location: Right knee Pain description: Tightness Aggravating factors: Flexion Relieving factors: ice, elevation, not bending knee   PRECAUTIONS: Fall  WEIGHT BEARING RESTRICTIONS: No  FALLS:  Has patient fallen in last 6 months? No, no close calls, no FOF   LIVING ENVIRONMENT: Lives with: lives with their spouse Lives in: House/apartment Stairs: 5 STE, has steep flight of steps inside home but  stair lift available for use  Has following equipment at home: Vannie - 2 wheeled, Shower bench, and stair lift, cane   OCCUPATION: retiredHydrographic surveyor at [employer]   PATIENT GOALS: be able to move without pain, get more mobile   NEXT MD VISIT: 09/12/23 Dr. Jerri  OBJECTIVE:  Note: Objective measures were completed at Evaluation unless otherwise noted.   PATIENT SURVEYS:   Patient-Specific Activity Scoring Scheme  0 represents "unable to perform." 10 represents "able to perform at prior level. 0 1 2 3 4 5 6 7 8 9  10 (Date and Score)   Activity Eval   08/16/23  1. Getting up from chairs  5    6  2. Picking something up  3  3  3. Getting comfortable in bed  1 5  Score 3 4.67   Total score = sum of the activity scores/number of activities Minimum detectable change (90%CI) for average score = 2 points Minimum detectable change (90%CI) for single activity score = 3 points  COGNITION: Overall cognitive status: Within functional limits for tasks assessed     SENSATION: Not tested  EDEMA:  Appropriate for post-op condition   LOWER EXTREMITY ROM:  Active ROM Right eval Left eval Rt 07/25/23 Rt 07/31/23 Rt 08/14/23 Right 08/16/2023  Hip flexion        Hip extension        Hip abduction        Hip adduction        Hip internal rotation        Hip external rotation        Knee flexion Supine heel slide 52*, HS curl in sitting 72*  Supine AA: 65 P: 78 Supine AA: 80  Supine A: 85 P: 90 Active 90  Knee extension 9* supine with heel prop   A: -10 P: -8 AA: -5 A: -7 P: -5 Active -2  Ankle dorsiflexion        Ankle plantarflexion        Ankle inversion        Ankle eversion         (Blank rows = not tested)  LOWER EXTREMITY MMT:  MMT Right eval Left eval Right 08/08/2023  Knee flexion 3- 4+ 4/5  Knee extension 4- 4+ 4+/5   (Blank rows = not tested)    FUNCTIONAL TESTS:  Eval:  Timed up and go (TUG): 29.7 seconds RW 2 3 minute walk test: 272ft, RW    GAIT: Eval:  Distance walked: 259ft Assistive device utilized: Environmental consultant - 2 wheeled Level of assistance: Modified independence Comments: limited heel toe pattern, antalgic gait, tends to lean heavily on RW                    TREATMENT         DATE: 08/20/2023 Therapeutic Exercise: Recumbent bike Seat 7 rocking flexion stretch for 3 min, full revolutions backwards 3 min (initial 90 sec with PT CGA), full revolutions forward 3 min (PT CGA first 5 revolutions)  Therapeutic Activities: PT demo & verbal cues on weight shift onto RLE 3 sec hold 5 reps prior to walking after sitting >/= 5 min.  Pt return demo understanding & verbalizes less initial pain.  Leg Press BLEs 87# 15 reps with PT cues on technique;  RLE only 37# 15 reps  PT demo & verbal cues on using 24 bar stool for modified standing to increase standing tolerance in kitchen.  Sit to stand without UE assist 5 reps and stand to sit light touch to ensure stool under pelvis 5 reps. PT demo & verbal cues on sit to/from stand technique. Pt able to perform 5 reps with BUE assist with improved equal BLE use.    Gait Training: Pre-gait weight shift over stance LE with knee ext in //bars and terminal stance knee flexion.  Carry over to gait with cane.   Vaso Right Knee 10 minutes medium compression 34* with elevation    TREATMENT         DATE: 08/16/2023 Recumbent bike Seat 7 for 5 minutes AAROM working into flexion and extension Quad sets with right heel prop 2  sets of 10 for 5 seconds Seated AAROM knee flexion 10 x 10 seconds (left pushes right into flexion) Tailgate knee flexion 2 minutes (emphasis on pulling into flexion)  Functional Activities: Double Leg Press (sit to stand, stairs) 75# 15 x stretch into flexion and extension for 5 seconds with slow eccentrics Single leg Press (Right): 31#   Vaso Right Knee 10 minutes High 34*   TREATMENT         DATE: 08/14/2023 Therex: UBE: bike level 2 x 10 minutes, LE only seat 11 and  progressed to 10 after a few minutes Standing gastroc stretch on slant board x 3 holding 30 sec Seated LAQ: 3# 2 x 10  holding 3 sec Manual:  Seated contract/relax with contralateral LE moving in opposite direction, Rt knee flexion mobs with IR/distraction TherActivites:  Leg Press: 75# bil LEs 3 x 10, Rt LE only: 37# 2 x 10  Sit to stand: x 10 with first 4 with UE support and last 6 with no UE support Step ups on 6 inch step: x 15 leading with Rt LE and lowering with Rt LE c single UE support Self Care:  Pt arriving today with a straight cane instead of her rolling walker. Pt's cane was adjusted to pt's height for comfort and to prevent left shoulder hiking.  Modalities:  Vasopneumatic: 34 deg, medium compression, LE elevated on bolster x 10 minutes     PATIENT EDUCATION:  Education details: exam findings, POC, HEP  Person educated: Patient Education method: Explanation, Demonstration, and Handouts Education comprehension: verbalized understanding, returned demonstration, and needs further education  HOME EXERCISE PROGRAM: Access Code: [email or web address] URL: [email or web address] Date: 08/16/2023 Prepared by: Lamar Ivory  Exercises - Standing Heel Raise with Support  - 1 x daily - 7 x weekly - 2 sets - 10 reps - Hip Abduction with Resistance Loop  - 1 x daily - 7 x weekly - 2 sets - 10 reps - Hip Extension with Resistance Loop  - 1 x daily - 7 x weekly - 2 sets - 10 reps - Sit to Stand with Arm Reach Toward Target  - 1 x daily - 7 x weekly - 1 sets - 10 reps - Seated Knee Extension with Resistance  - 1 x daily - 7 x weekly - 2 sets - 10 reps - Seated Hamstring Curls with Resistance  - 1 x daily - 7 x weekly - 2 sets - 10 reps - Seated Knee Flexion Stretch  - 2-3 x daily - 7 x weekly - 2-3 sets - 20-30 sec hold - Seated Quad Set  - 2-3 x daily - 7 x weekly - 2-3 sets - 10 reps - 3 sec hold - Seated Knee Flexion AAROM  - 3 x daily - 7 x weekly - 1 sets - 10 reps - 10  seconds hold - Supine Quadricep Sets  - 3 x daily - 7 x weekly - 2 sets - 10 reps - 5 second hold  ASSESSMENT:  CLINICAL IMPRESSION: PT addressed therapeutic / functional activities today which she improved with instructions and appears to understand.    OBJECTIVE IMPAIRMENTS: Abnormal gait, decreased activity tolerance, decreased balance, decreased knowledge of use of DME, decreased mobility, difficulty walking, decreased ROM, decreased strength, hypomobility, increased fascial restrictions, impaired flexibility, and pain.    GOALS: Goals reviewed with patient? No  SHORT TERM GOALS: Target date: 08/17/2023    Will be compliant with appropriate progressive HEP Goal status: MET 07/25/23   2.  Rt knee AROM flexion to be at least and extension AROM to be at least 95* Goal status:  on going 08/16/2023    3. Will be independent with edema management strategies  Goal status: MET  08/14/2023 (compression stockings, ice and elevation)   4. Gait pattern to have normalized with LRAD  Goal status: MET 08/14/23 c straight cane   5. Will complete TUG test in 15 seconds or less with LRAD to show improved functional balance  Goal status: on going 08/08/2023     LONG TERM GOALS: Target date: 09/14/2023    MMT to have improved by one grade all weak groups Goal status: on going  08/20/2023     2. Knee AROM to be WNL and pain free Goal status: on going 08/20/2023    3. Will be able to ascend and descend stairs reciprocally with no increase in pain Goal status: on going  08/20/2023    4. Will be able to ambulate community distances and perform all household tasks with no increase in pain  Goal status: on going  08/20/2023    5. PSFS to have improved by at least 3 points to show improved QOL and subjective improvement  Goal status: on going 08/20/2023    PLAN:  PT FREQUENCY: 2x/week  PT DURATION: 8 weeks  PLANNED INTERVENTIONS: 97750- Physical Performance Testing, 97110-Therapeutic  exercises, 97530- Therapeutic activity, 97112- Neuromuscular re-education, 97535- Self Care, [id number]- Manual therapy, [id number]- Gait training, 97016- Vasopneumatic device, Balance training, Stair training, Taping, Dry Needling, and DME instructions  PLAN FOR NEXT SESSION:  check ROM,    Continue progression of quadriceps strengthening for Rt leg (leg press again, etc.) while addressing flexion AROM.  Balance and functional progressions as appropriate.    Grayce Spatz, PT, DPT 08/20/2023, 12:21 PM

## 2023-08-21 ENCOUNTER — Encounter (HOSPITAL_BASED_OUTPATIENT_CLINIC_OR_DEPARTMENT_OTHER): Payer: Self-pay | Admitting: Cardiovascular Disease

## 2023-08-21 ENCOUNTER — Ambulatory Visit (HOSPITAL_BASED_OUTPATIENT_CLINIC_OR_DEPARTMENT_OTHER): Payer: Commercial Managed Care - PPO | Admitting: Cardiovascular Disease

## 2023-08-21 VITALS — BP 116/76 | HR 89 | Resp 14 | Ht 64.0 in | Wt 223.5 lb

## 2023-08-21 DIAGNOSIS — I10 Essential (primary) hypertension: Secondary | ICD-10-CM

## 2023-08-21 DIAGNOSIS — I700 Atherosclerosis of aorta: Secondary | ICD-10-CM | POA: Diagnosis not present

## 2023-08-21 DIAGNOSIS — D869 Sarcoidosis, unspecified: Secondary | ICD-10-CM | POA: Diagnosis not present

## 2023-08-21 DIAGNOSIS — I2583 Coronary atherosclerosis due to lipid rich plaque: Secondary | ICD-10-CM | POA: Diagnosis not present

## 2023-08-21 DIAGNOSIS — E785 Hyperlipidemia, unspecified: Secondary | ICD-10-CM

## 2023-08-21 NOTE — Progress Notes (Signed)
 [facility] Initial Assessment:    Date:  08/21/2023   ID:  Marilyn [name], DOB 09/24/1955, MRN [id number]  PCP:  Marilyn Karlynn GAILS, MD  Cardiologist:  None   Referring MD: Marilyn Karlynn GAILS, MD   CC: Hypertension  History of Present Illness:    Marilyn [name] is a 68 y.o. female with a hx of coronary calcification, OSA, prior tobacco abuse, pre-diabetes, hypertension, hyperlipidemia, left breast cancer s/p radiation therapy, COPD, sarcoidosis, and anemia, here for follow up.  She was first seen in the [facility] 04/2021. She was seen in the ED 03/10/2021 with concern for hypertension. She had recently been started on a new antihypertensive. Cardiac enzymes were negative. She was referred to the [facility]. She saw Dr. Bernie on 03/2021 for coronary calcification. He recommended that she start a statin and amlodipine  was increased due to poor blood pressure control. He referred her for a sleep study. She first saw him 02/2021 and her blood pressure was 190/100. She had previously been on a diuretic but developed palpitations and was switched to bisoprolol. She had an echo 03/2021 with LVEF 60-65% and grade 1 diastolic dysfunction. There was mild LVH.   She reported noticing her blood pressure has been up and down for a while but only recently formally diagnosed. On 01/14/2021 she was started on 12.5 mg HCTZ, which was the first time she was prescribed an antihypertensive. This was switched to bystolic , which was later switched to amlodipine .  At her initial visit she was on amlodipine  only and blood pressure was averaging in the 120s to 130s over 80s.  She was under stress from caring for her mother with dementia in her father who was on hemodialysis.  She noted some exertional dyspnea.  We recommended getting more exercise and working on her stress levels.  Prior CT revealed coronary calcification and a calcium  score  that was in the 82nd percentile.  A statin was recommended but she wanted to work on diet and exercise.  She was referred for sleep study and found to have moderate sleep apnea.  At her visit 02/2023 blood pressures were in the 140s.  She also had ankle swelling and wanted to stop amlodipine .  Amlodipine  was discontinued and she was started on spironolactone .  Given her calcium  score she was also started on rosuvastatin .  Discussed the use of AI scribe software for clinical note transcription with the patient, who gave verbal consent to proceed.  History of Present Illness Ms. Marilyn [name] has been experiencing shortness of breath since November, particularly during exertion such as getting dressed or climbing stairs. Her breathing is more labored during these activities. She uses Symbicort  twice daily and an albuterol  rescue inhaler as needed, which provides relief. She is concerned about her weight contributing to her breathing difficulties and hopes that weight loss will improve her symptoms.  She underwent a right knee replacement on May 5th and is currently undergoing physical therapy. The surgery went well, and she is working on improving her knee's range of motion. She has started driving again as of last week.  Her recent lab work showed a decrease in A1c from 6.4 to 5.8. Her vitamin D  levels were low. Other lab results, including kidney and liver function, electrolytes, and cholesterol, were normal.  She monitors her blood pressure at home using a device connected to her phone. Her blood pressure readings have been mostly in the 120s to 130s range, with the highest reading  of 150 occurring post-surgery. She is currently on spironolactone , which was increased from 25 mg, and rosuvastatin .  A previous sleep study in 2023 indicated moderate sleep apnea, but she has not been formally diagnosed. Her husband has observed that she sometimes stops breathing at night. She uses a watch that records her sleep  patterns, indicating low oxygen levels and snoring.   Previous antihypertensives: HCTZ Nebivolol    Past Medical History:  Diagnosis Date   Allergy    Anemia    hx   Anxiety    panic disorder   Breast cancer (HCC) 06/06/2011   bc  left breast 3 o'clock dx=invasive ductal ca uoqER/PR=positive   Cancer (HCC)    BREAST - left   Cigarette smoker    Colon polyp    Complication of anesthesia    DIFFICULTY AWAKENING, BP INCREASE AFTER CHOLECYSTECTEMY    COPD (chronic obstructive pulmonary disease) (HCC)    Depression    Diverticulosis of colon    DJD (degenerative joint disease)    Family history of breast cancer    Family history of colon cancer    Family history of pancreatic cancer    Family history of prostate cancer    Gallstones    GERD (gastroesophageal reflux disease)    Pt. denies having GERD. Unable to remove it.   History of anemia    History of bronchitis    History of sarcoidosis    Hyperlipidemia    no meds needed   Hypertension    Incisional breast wound    AT AGE 22 BILATERAL INCISION TO BREAST MADE   Joint pain    Leg swelling    Panic disorder    Personal history of radiation therapy    Pre-diabetes    S/P radiation therapy 08/17/11 - 09/28/11   LLQ - 50 Gy/25 Fractions with Boost of 10 Gy / 5 fractions   Sialoadenitis of submandibular gland 2016   Vitamin D  deficiency     Past Surgical History:  Procedure Laterality Date   BIOPSY BREAST     left breast  as a teenager  benign   BREAST BIOPSY Left 06/06/2011   BREAST EXCISIONAL BIOPSY Bilateral    BREAST LUMPECTOMY Left 2013   BREAST SURGERY  07/05/11   left breast lumpectomy with needle loc & axillary sln bx   BUNIONECTOMY  2011   bilateral by Dr Lea   CHOLECYSTECTOMY  02/2019   COLONOSCOPY     polyp   cyst removed from right hand  1995   POLYPECTOMY     TONSILLECTOMY  1972   TOTAL ABDOMINAL HYSTERECTOMY  2000   Dr Rosalynn   TOTAL KNEE ARTHROPLASTY Right 07/02/2023   Procedure:  ARTHROPLASTY, KNEE, TOTAL;  Surgeon: Marilyn Kay HERO, MD;  Location: [facility] OR;  Service: Orthopedics;  Laterality: Right;   TRIGGER FINGER RELEASE  2008   Dr Sissy    Current Medications: Current Meds  Medication Sig   albuterol  (VENTOLIN  HFA) 108 (90 Base) MCG/ACT inhaler Inhale 2 puffs into the lungs every 6 (six) hours as needed for wheezing or shortness of breath.   budesonide -formoterol  (SYMBICORT ) 80-4.5 MCG/ACT inhaler Inhale 2 puffs into the lungs 2 (two) times daily.   busPIRone  (BUSPAR ) 30 MG tablet TAKE 1/2 TO 1 (ONE-HALF TO ONE) TABLET BY MOUTH TWICE DAILY (Patient taking differently: Take 15 mg by mouth daily.)   cetirizine  (ZYRTEC ) 10 MG chewable tablet Chew 10 mg by mouth daily.   HYDROcodone -acetaminophen  (NORCO/VICODIN) 5-325 MG tablet Take  1-2 tablets by mouth every 6 (six) hours as needed for moderate pain (pain score 4-6).   Menthol , Topical Analgesic, (BIOFREEZE) 10 % CREA Apply 1 application  topically daily as needed (pain).   rosuvastatin  (CRESTOR ) 10 MG tablet Take 1 tablet (10 mg total) by mouth daily.   spironolactone  (ALDACTONE ) 50 MG tablet Take 1 tablet (50 mg total) by mouth daily.   Trolamine Salicylate (BLUE-EMU HEMP EX) Apply 1 Application topically daily as needed (pain).     Allergies:   Lexapro [escitalopram oxalate], Microzide  [hydrochlorothiazide ], Oxycodone  hcl, and Penicillins   Social History   Socioeconomic History   Marital status: Married    Spouse name: Not on file   Number of children: 2   Years of education: Not on file   Highest education level: Bachelor's degree (e.g., BA, AB, BS)  Occupational History   Occupation: retired  Tobacco Use   Smoking status: Former    Current packs/day: 0.00    Types: Cigarettes    Quit date: 12/28/2020    Years since quitting: 2.6   Smokeless tobacco: Never  Vaping Use   Vaping status: Never Used  Substance and Sexual Activity   Alcohol use: Yes    Alcohol/week: 0.0 - 1.0 standard drinks of alcohol     Comment: social   Drug use: Never   Sexual activity: Not Currently    Birth control/protection: Surgical    Comment: menses age 42,hrt started  09/05/1998  Other Topics Concern   Not on file  Social History Narrative   Widowed - husband died 2003-09-05 and 2 step-childrenExercises 3x per week2 cups of caffeine daily   Social Drivers of Corporate investment banker Strain: Low Risk  (08/07/2022)   Overall Financial Resource Strain (CARDIA)    Difficulty of Paying Living Expenses: Not very hard  Food Insecurity: No Food Insecurity (08/07/2022)   Hunger Vital Sign    Worried About Running Out of Food in the Last Year: Never true    Ran Out of Food in the Last Year: Never true  Transportation Needs: No Transportation Needs (08/07/2022)   PRAPARE - Administrator, Civil Service (Medical): No    Lack of Transportation (Non-Medical): No  Physical Activity: Unknown (08/07/2022)   Exercise Vital Sign    Days of Exercise per Week: 0 days    Minutes of Exercise per Session: Not on file  Stress: No Stress Concern Present (08/07/2022)   [employer] of Occupational Health - Occupational Stress Questionnaire    Feeling of Stress : Not at all  Social Connections: Socially Integrated (08/07/2022)   Social Connection and Isolation Panel    Frequency of Communication with Friends and Family: Three times a week    Frequency of Social Gatherings with Friends and Family: Once a week    Attends Religious Services: More than 4 times per year    Active Member of [employer] or Organizations: Yes    Attends Banker Meetings: 1 to 4 times per year    Marital Status: Married     Family History: The patient's family history includes Alcohol abuse in her mother; Anemia in her mother; Bladder Cancer in her father; Brain cancer in an other family member; Breast cancer in her cousin, cousin, and sister; Colon cancer (age of onset: 94) in her father; Colon polyps in her father; Dementia in her  maternal aunt, maternal aunt, and mother; Depression in her mother; Heart disease in her father and mother; Hyperlipidemia in  her brother, father, and mother; Hypertension in her brother, father, mother, sister, and sister; Lung cancer in her maternal uncle, paternal aunt, and sister; Multiple sclerosis in her brother; Obesity in her mother; Other in her mother; Pancreatic cancer (age of onset: 21) in her sister; Prostate cancer in her brother and father; Sarcoidosis in her cousin and sister; Seizures in her maternal uncle and mother. There is no history of Esophageal cancer, Stomach cancer, or Rectal cancer.  ROS:   Please see the history of present illness.    (+) Knee pain (+) Left hip pain (+) Bilateral ankle pain and swelling (+) Shortness of breath All other systems reviewed and are negative.  EKGs/Labs/Other Studies Reviewed:     EKG Interpretation Date/Time:    Ventricular Rate:    PR Interval:    QRS Duration:    QT Interval:    QTC Calculation:   R Axis:      Text Interpretation:         Echo 03/30/2021:  1. Left ventricular ejection fraction, by estimation, is 60 to 65%. The  left ventricle has normal function. The left ventricle has no regional  wall motion abnormalities. There is mild left ventricular hypertrophy.  Left ventricular diastolic parameters  are consistent with Grade I diastolic dysfunction (impaired relaxation).   2. Right ventricular systolic function is normal. The right ventricular  size is normal.   3. The mitral valve is normal in structure. No evidence of mitral valve  regurgitation. No evidence of mitral stenosis.   4. The aortic valve is normal in structure. Aortic valve regurgitation is  not visualized. No aortic stenosis is present.   5. The inferior vena cava is normal in size with greater than 50%  respiratory variability, suggesting right atrial pressure of 3 mmHg.  CT Calcium  Score 01/28/2021:   IMPRESSION: Coronary calcium  score of  79.6. This was 82nd percentile for age-, race-, and sex-matched controls.   Mild calcification of the aortic root.   EKG:  EKG is personally reviewed. 05/18/2021: EKG was not ordered. 03/14/2021 (Dr. Bernie): normal sinus rhythm normal P interval possible left atrium enlargement, no evidence of LVH, no ST segment changes  Recent Labs: 08/13/2023: ALT 13; BUN 11; Creatinine, Ser 0.73; Hemoglobin 11.9; Platelets 283; Potassium 4.5; Sodium 138; TSH 1.420   Recent Lipid Panel    Component Value Date/Time   CHOL 158 08/13/2023 1055   TRIG 74 08/13/2023 1055   HDL 78 08/13/2023 1055   CHOLHDL 2.0 08/13/2023 1055   CHOLHDL 3 02/08/2023 1127   VLDL 14.6 02/08/2023 1127   LDLCALC 66 08/13/2023 1055   LDLDIRECT 102 (H) 10/14/2021 1326   LDLDIRECT 118.9 12/18/2011 1138    Physical Exam:    VS:  BP 116/76 (BP Location: Right Arm, Patient Position: Sitting, Cuff Size: Large)   Pulse 89   Resp 14   Ht 5' 4 (1.626 m)   Wt 223 lb 8 oz (101.4 kg)   SpO2 97%   BMI 38.36 kg/m  , BMI Body mass index is 38.36 kg/m. GENERAL:  Well appearing HEENT: Pupils equal round and reactive, fundi not visualized, oral mucosa unremarkable NECK:  No jugular venous distention, waveform within normal limits, carotid upstroke brisk and symmetric, no bruits, no thyromegaly LUNGS:  Clear to auscultation bilaterally HEART:  RRR.  PMI not displaced or sustained,S1 and S2 within normal limits, no S3, no S4, no clicks, no rubs, no murmurs ABD:  Flat, positive bowel sounds normal in frequency in  pitch, no bruits, no rebound, no guarding, no midline pulsatile mass, no hepatomegaly, no splenomegaly EXT:  2 plus pulses throughout, no edema, no cyanosis no clubbing SKIN:  No rashes no nodules NEURO:  Cranial nerves II through XII grossly intact, motor grossly intact throughout PSYCH:  Cognitively intact, oriented to person place and time   ASSESSMENT/PLAN:    Assessment & Plan # Nonobstructive CAD: #  Hyperlipidemia: Lipids are well-controlled.  Continue rosuvastatin .  # Hypertension: Blood pressure is well-controlled on spironolactone .  Encouraged her to continue increasing her exercise.  # Exertional dyspnea:  Moderate airway obstruction with minimal diffusion defect on PFTs. Persistent dyspnea despite Symbicort  and Albuterol  use.  She has known nonobstructive CAD.  Symptoms unchanged since her cardiac cath last year. - Order cardiac PET stress test with pharmacological stress agent to assess cardiac function and rule out cardiac causes of dyspnea.  # Sleep apnea Moderate sleep apnea indicated by previous home study. Discussed CPAP benefits including reduced stroke risk, improved blood pressure, and better sleep quality. - Order in-lab sleep study at [facility] to confirm diagnosis and potentially initiate CPAP therapy.    Screening for Secondary Hypertension:     05/18/2021    3:28 PM  Causes  Drugs/Herbals Screened     - Comments high sodium.  Limiting caffeine intake.  One soda daily.  Celebrex  once per week.  Rare EtOH.  Renovascular HTN N/A  Sleep Apnea Screened     - Comments sleep study  Thyroid  Disease Screened  Hyperaldosteronism N/A  Pheochromocytoma N/A  Cushing's Syndrome N/A  Hyperparathyroidism Screened  Coarctation of the Aorta N/A     - Comments Unable to assess.  L breast cancer.  Compliance Screened    Relevant Labs/Studies:    Latest Ref Rng & Units 08/13/2023   10:55 AM 06/22/2023    2:00 PM 05/25/2023   10:37 AM  Basic Labs  Sodium 134 - 144 mmol/L 138  139  139   Potassium 3.5 - 5.2 mmol/L 4.5  4.2  4.6   Creatinine 0.57 - 1.00 mg/dL 9.26  9.32  9.21        Latest Ref Rng & Units 08/13/2023   10:55 AM 02/08/2023   11:27 AM  Thyroid    TSH 0.450 - 4.500 uIU/mL 1.420  1.75     Disposition:    FU with Girl Schissler C. Raford, MD, [facility] in 1 year   Medication Adjustments/Labs and Tests Ordered: Current medicines are reviewed at length  with the patient today.  Concerns regarding medicines are outlined above.   Orders Placed This Encounter  Procedures   NM PET CT CARDIAC PERFUSION MULTI W/ABSOLUTE BLOODFLOW   Nocturnal polysomnography (NPSG)   No orders of the defined types were placed in this encounter.     Signed, Annabella Raford, MD  08/21/2023 12:45 PM    [facility]

## 2023-08-21 NOTE — Patient Instructions (Signed)
 Medication Instructions:  Your physician recommends that you continue on your current medications as directed. Please refer to the Current Medication list given to you today.     Testing/Procedures:    Please report to Radiology at the [facility] Main Entrance 30 minutes early for your test.  [address], [place] [address]                         OR   Please report to Radiology at [facility] Main Entrance, medical mall, 30 mins prior to your test.  [address]  [place], [place]  How to Prepare for Your Cardiac PET/CT Stress Test:  Nothing to eat or drink, except water, 3 hours prior to arrival time.  NO caffeine/decaffeinated products, or chocolate 12 hours prior to arrival. (Please note decaffeinated beverages (teas/coffees) still contain caffeine).  If you have caffeine within 12 hours prior, the test will need to be rescheduled.  Medication instructions: Do not take erectile dysfunction medications for 72 hours prior to test (sildenafil, tadalafil) Do not take nitrates (isosorbide mononitrate, Ranexa) the day before or day of test Do not take tamsulosin the day before or morning of test Hold theophylline containing medications for 12 hours. Hold Dipyridamole 48 hours prior to the test.  Diabetic Preparation: If able to eat breakfast prior to 3 hour fasting, you may take all medications, including your insulin . Do not worry if you miss your breakfast dose of insulin  - start at your next meal. If you do not eat prior to 3 hour fast-Hold all diabetes (oral and insulin ) medications. Patients who wear a continuous glucose monitor MUST remove the device prior to scanning.  You may take your remaining medications with water.  NO perfume, cologne or lotion on chest or abdomen area. FEMALES - Please avoid wearing dresses to this appointment.  Total time is 1 to 2 hours; you may want to bring reading material for the waiting  time.  IF YOU THINK YOU MAY BE PREGNANT, OR ARE NURSING PLEASE INFORM THE TECHNOLOGIST.  In preparation for your appointment, medication and supplies will be purchased.  Appointment availability is limited, so if you need to cancel or reschedule, please call the Radiology Department Scheduler at [phone] 24 hours in advance to avoid a cancellation fee of $100.00  What to Expect When you Arrive:  Once you arrive and check in for your appointment, you will be taken to a preparation room within the Radiology Department.  A technologist or Nurse will obtain your medical history, verify that you are correctly prepped for the exam, and explain the procedure.  Afterwards, an IV will be started in your arm and electrodes will be placed on your skin for EKG monitoring during the stress portion of the exam. Then you will be escorted to the PET/CT scanner.  There, staff will get you positioned on the scanner and obtain a blood pressure and EKG.  During the exam, you will continue to be connected to the EKG and blood pressure machines.  A small, safe amount of a radioactive tracer will be injected in your IV to obtain a series of pictures of your heart along with an injection of a stress agent.    After your Exam:  It is recommended that you eat a meal and drink a caffeinated beverage to counter act any effects of the stress agent.  Drink plenty of fluids for the remainder of the  day and urinate frequently for the first couple of hours after the exam.  Your doctor will inform you of your test results within 7-10 business days.  For more information and frequently asked questions, please visit our website: [email or web address]  For questions about your test or how to prepare for your test, please call: Cardiac Imaging Nurse Navigators Office: [phone]    Follow-Up: Follow up in 1 year with Dr. Raford, Marilyn [name], or Marilyn Bane, NP   Referrals: You have been referred  o  Marilyn [name] to get a sleep study.

## 2023-08-22 ENCOUNTER — Ambulatory Visit (INDEPENDENT_AMBULATORY_CARE_PROVIDER_SITE_OTHER): Admitting: Physical Therapy

## 2023-08-22 ENCOUNTER — Encounter: Payer: Self-pay | Admitting: Physical Therapy

## 2023-08-22 DIAGNOSIS — R262 Difficulty in walking, not elsewhere classified: Secondary | ICD-10-CM

## 2023-08-22 DIAGNOSIS — M6281 Muscle weakness (generalized): Secondary | ICD-10-CM | POA: Diagnosis not present

## 2023-08-22 DIAGNOSIS — M25661 Stiffness of right knee, not elsewhere classified: Secondary | ICD-10-CM | POA: Diagnosis not present

## 2023-08-22 DIAGNOSIS — M25561 Pain in right knee: Secondary | ICD-10-CM

## 2023-08-22 DIAGNOSIS — R600 Localized edema: Secondary | ICD-10-CM | POA: Diagnosis not present

## 2023-08-22 NOTE — Therapy (Signed)
 OUTPATIENT PHYSICAL THERAPY LOWER EXTREMITY TREATMENT   Patient Name: Marilyn [name] MRN: [id number] DOB:[birth year], 68 y.o., female Today's Date: 08/22/2023  END OF SESSION:  PT End of Session - 08/22/23 1106     Visit Number 11    Number of Visits 17    Date for PT Re-Evaluation 09/14/23    Authorization Type [employer] U[employer]    Authorization Time Period 07/20/23 to 09/14/23    Progress Note Due on Visit 60    PT Start Time 1100    PT Stop Time 1155    PT Time Calculation (min) 55 min    Activity Tolerance Patient tolerated treatment well;No increased pain;Patient limited by pain    Behavior During Therapy [facility] for tasks assessed/performed             Past Medical History:  Diagnosis Date   Allergy    Anemia    hx   Anxiety    panic disorder   Breast cancer (HCC) 06/06/2011   bc  left breast 3 o'clock dx=invasive ductal ca uoqER/PR=positive   Cancer (HCC)    BREAST - left   Cigarette smoker    Colon polyp    Complication of anesthesia    DIFFICULTY AWAKENING, BP INCREASE AFTER CHOLECYSTECTEMY    COPD (chronic obstructive pulmonary disease) (HCC)    Depression    Diverticulosis of colon    DJD (degenerative joint disease)    Family history of breast cancer    Family history of colon cancer    Family history of pancreatic cancer    Family history of prostate cancer    Gallstones    GERD (gastroesophageal reflux disease)    Pt. denies having GERD. Unable to remove it.   History of anemia    History of bronchitis    History of sarcoidosis    Hyperlipidemia    no meds needed   Hypertension    Incisional breast wound    AT AGE 50 BILATERAL INCISION TO BREAST MADE   Joint pain    Leg swelling    Panic disorder    Personal history of radiation therapy    Pre-diabetes    S/P radiation therapy 08/17/11 - 09/28/11   LLQ - 50 Gy/25 Fractions with Boost of 10 Gy / 5 fractions   Sialoadenitis of submandibular gland 2016   Vitamin D  deficiency    Past Surgical  History:  Procedure Laterality Date   BIOPSY BREAST     left breast  as a teenager  benign   BREAST BIOPSY Left 06/06/2011   BREAST EXCISIONAL BIOPSY Bilateral    BREAST LUMPECTOMY Left 2013   BREAST SURGERY  07/05/11   left breast lumpectomy with needle loc & axillary sln bx   BUNIONECTOMY  2011   bilateral by Dr Lea   CHOLECYSTECTOMY  02/2019   COLONOSCOPY     polyp   cyst removed from right hand  1995   POLYPECTOMY     TONSILLECTOMY  1972   TOTAL ABDOMINAL HYSTERECTOMY  2000   Dr Rosalynn   TOTAL KNEE ARTHROPLASTY Right 07/02/2023   Procedure: ARTHROPLASTY, KNEE, TOTAL;  Surgeon: Jerri Kay HERO, MD;  Location: [facility] OR;  Service: Orthopedics;  Laterality: Right;   TRIGGER FINGER RELEASE  2008   Dr Sissy   Patient Active Problem List   Diagnosis Date Noted   Status post total right knee replacement 07/02/2023   Primary osteoarthritis of right knee 07/01/2023   Hypercholesterolemia 05/10/2022   Trochanteric bursitis  of right hip 05/10/2022   Preop exam for internal medicine 02/09/2022   Plantar flexed metatarsal bone of right foot 01/11/2022   Grief 09/28/2021   Leg swelling 04/18/2021   Incisional breast wound 04/18/2021   History of sarcoidosis 04/18/2021   History of bronchitis 04/18/2021   GERD (gastroesophageal reflux disease) 04/18/2021   Family history of prostate cancer 04/18/2021   Depression 04/18/2021   Complication of anesthesia 04/18/2021   Colon polyp 04/18/2021   Cigarette smoker 04/18/2021   Cancer (HCC) 04/18/2021   Coronary atherosclerosis 01/30/2021   Aortic atherosclerosis (HCC) 01/30/2021   Primary hypertension 01/14/2021   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   Knee pain, chronic 08/28/2019   Elevated BP without diagnosis of hypertension 08/28/2019   Callus of foot 08/28/2019   Cholelithiasis 12/26/2018   Genetic testing 12/26/2017   Family history of breast cancer    Family history of pancreatic cancer    Family history of colon cancer     Abdominal pain 07/28/2016   Morbid obesity (HCC) 03/13/2016   Dyspnea 11/09/2015   Rash and nonspecific skin eruption 07/27/2015   Well adult exam 07/23/2014   Sialoadenitis 06/02/2014   Edema 07/07/2013   AB (asthmatic bronchitis) 06/23/2013   Anxiety    Malignant neoplasm of upper-outer quadrant of left breast in female, 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   Allergy 06/06/2011   COLONIC POLYPS 01/25/2010   DIVERTICULOSIS OF COLON 01/25/2010   DEGENERATIVE JOINT DISEASE 11/06/2008   PANIC DISORDER 07/23/2008   Sarcoidosis 10/13/2007   Vitamin D  deficiency 10/13/2007   ANEMIA 10/13/2007   BRONCHITIS 10/13/2007   Dyslipidemia 04/23/2007   Adjustment disorder with mixed anxiety and depressed mood 04/23/2007   Venous (peripheral) insufficiency 04/23/2007   HEADACHE 04/23/2007    PCP: Garald Scrape MD   REFERRING PROVIDER: Jule Ronal CROME, PA-C  REFERRING DIAG: Diagnosis [id number] (ICD-10-CM) - Status post total right knee replacement  THERAPY DIAG:  Stiffness of right knee, not elsewhere classified  Difficulty in walking, not elsewhere classified  Muscle weakness (generalized)  Localized edema  Acute pain of right knee  Rationale for Evaluation and Treatment: Rehabilitation  ONSET DATE: Surgery 07/02/23  SUBJECTIVE:   SUBJECTIVE STATEMENT: She was able to use a floor ergometer.  She is thinking about going to [facility] or go back to [facility].    PERTINENT HISTORY: See above   PAIN:  (2 tylenol  / no hydrocodone  before PT) NPRS scale: at rest 0-4/10 standing & walking 1/10 and trying bend  0-6/10  Pain location: Right knee Pain description: Tightness Aggravating factors: Flexion Relieving factors: ice, elevation, not bending knee   PRECAUTIONS: Fall  WEIGHT BEARING RESTRICTIONS: No  FALLS:  Has patient fallen in last 6 months? No, no close calls, no FOF   LIVING ENVIRONMENT: Lives with: lives with their spouse Lives in:  House/apartment Stairs: 5 STE, has steep flight of steps inside home but stair lift available for use  Has following equipment at home: Vannie - 2 wheeled, Shower bench, and stair lift, cane   OCCUPATION: retiredHydrographic surveyor at [employer]   PATIENT GOALS: be able to move without pain, get more mobile   NEXT MD VISIT: 09/12/23 Dr. Jerri  OBJECTIVE:  Note: Objective measures were completed at Evaluation unless otherwise noted.   PATIENT SURVEYS:   Patient-Specific Activity Scoring Scheme  0 represents "unable to perform." 10 represents "able to perform at prior level. 0 1 2 3 4 5 6 7 8 9  10 (Date and  Score)   Activity Eval   08/16/23  1. Getting up from chairs   5    6  2. Picking something up  3  3  3. Getting comfortable in bed  1 5  Score 3 4.67   Total score = sum of the activity scores/number of activities Minimum detectable change (90%CI) for average score = 2 points Minimum detectable change (90%CI) for single activity score = 3 points  COGNITION: Overall cognitive status: Within functional limits for tasks assessed     SENSATION: Not tested  EDEMA:  Appropriate for post-op condition   LOWER EXTREMITY ROM:  Active ROM Right eval Left eval Rt 07/25/23 Rt 07/31/23 Rt 08/14/23 Right 08/16/23  Hip flexion        Hip extension        Hip abduction        Hip adduction        Hip internal rotation        Hip external rotation        Knee flexion Supine heel slide 52*, HS curl in sitting 72*  Supine AA: 65 P: 78 Supine AA: 80  Supine A: 85 P: 90 Active 90  Knee extension 9* supine with heel prop   A: -10 P: -8 AA: -5 A: -7 P: -5 Active -2  Ankle dorsiflexion        Ankle plantarflexion        Ankle inversion        Ankle eversion         (Blank rows = not tested)  LOWER EXTREMITY MMT:  MMT Right eval Left eval Right 08/08/2023  Knee flexion 3- 4+ 4/5  Knee extension 4- 4+ 4+/5   (Blank rows = not tested)    FUNCTIONAL TESTS:  Eval:  Timed up and go  (TUG): 29.7 seconds RW 2 3 minute walk test: 222ft, RW   GAIT: Eval:  Distance walked: 259ft Assistive device utilized: Environmental consultant - 2 wheeled Level of assistance: Modified independence Comments: limited heel toe pattern, antalgic gait, tends to lean heavily on RW                    TREATMENT         DATE: 08/22/2023 Therapeutic Exercise: Recumbent bike Seat 7 rocking flexion stretch for 3 min, full revolutions backwards 3 min (initial 3 reps with PT CGA), full revolutions forward 3 min (PT CGA first 5 revolutions) RLE Seated heel slides with foot on pillow case 10 reps: knee ext / quad set 5 sec & active knee flexion with end range assist with LLE  /hamstring set 5 sec.  Supine RLE heel slide with strap & 12 ball knee flexion stretch 5 sec and leg press/ knee ext 5 sec 10 reps PT encouraged patient to perform the above to exercises as part of her HEP as both help to work on full extension to max flexion.  Patient verbalized understanding  Therapeutic Activities: Leg Press BLEs 93# 15 reps with PT cues on technique;  RLE only 43# 15 reps   Self-care: PT demo and verbal cues on ice massage.  Patient verbalized and return demonstration understanding.  Vaso Right Knee 10 minutes medium compression 34* with elevation   TREATMENT         DATE: 08/20/2023 Therapeutic Exercise: Recumbent bike Seat 7 rocking flexion stretch for 3 min, full revolutions backwards 3 min (initial 90 sec with PT CGA), full revolutions forward 3 min (PT CGA  first 5 revolutions)  Therapeutic Activities: PT demo & verbal cues on weight shift onto RLE 3 sec hold 5 reps prior to walking after sitting >/= 5 min.  Pt return demo understanding & verbalizes less initial pain.  Leg Press BLEs 87# 15 reps with PT cues on technique;  RLE only 37# 15 reps  PT demo & verbal cues on using 24 bar stool for modified standing to increase standing tolerance in kitchen.  Sit to stand without UE assist 5 reps and stand to sit light  touch to ensure stool under pelvis 5 reps. PT demo & verbal cues on sit to/from stand technique. Pt able to perform 5 reps with BUE assist with improved equal BLE use.    Gait Training: Pre-gait weight shift over stance LE with knee ext in //bars and terminal stance knee flexion.  Carry over to gait with cane.   Vaso Right Knee 10 minutes medium compression 34* with elevation    TREATMENT         DATE: 08/16/2023 Recumbent bike Seat 7 for 5 minutes AAROM working into flexion and extension Quad sets with right heel prop 2 sets of 10 for 5 seconds Seated AAROM knee flexion 10 x 10 seconds (left pushes right into flexion) Tailgate knee flexion 2 minutes (emphasis on pulling into flexion)  Functional Activities: Double Leg Press (sit to stand, stairs) 75# 15 x stretch into flexion and extension for 5 seconds with slow eccentrics Single leg Press (Right): 31#   Vaso Right Knee 10 minutes High 34*    PATIENT EDUCATION:  Education details: exam findings, POC, HEP  Person educated: Patient Education method: Programmer, multimedia, Demonstration, and Handouts Education comprehension: verbalized understanding, returned demonstration, and needs further education  HOME EXERCISE PROGRAM: Access Code: [email or web address] URL: [email or web address] Date: 08/16/2023 Prepared by: Lamar Ivory  Exercises - Standing Heel Raise with Support  - 1 x daily - 7 x weekly - 2 sets - 10 reps - Hip Abduction with Resistance Loop  - 1 x daily - 7 x weekly - 2 sets - 10 reps - Hip Extension with Resistance Loop  - 1 x daily - 7 x weekly - 2 sets - 10 reps - Sit to Stand with Arm Reach Toward Target  - 1 x daily - 7 x weekly - 1 sets - 10 reps - Seated Knee Extension with Resistance  - 1 x daily - 7 x weekly - 2 sets - 10 reps - Seated Hamstring Curls with Resistance  - 1 x daily - 7 x weekly - 2 sets - 10 reps - Seated Knee Flexion Stretch  - 2-3 x daily - 7 x weekly - 2-3 sets - 20-30 sec hold - Seated Quad  Set  - 2-3 x daily - 7 x weekly - 2-3 sets - 10 reps - 3 sec hold - Seated Knee Flexion AAROM  - 3 x daily - 7 x weekly - 1 sets - 10 reps - 10 seconds hold - Supine Quadricep Sets  - 3 x daily - 7 x weekly - 2 sets - 10 reps - 5 second hold  ASSESSMENT:  CLINICAL IMPRESSION: Patient had improved range of motion.  Patient appears to understand ice massage and benefits for edema and pain management.  Patient continues to benefit from skilled physical therapy to recover from TKA.  OBJECTIVE IMPAIRMENTS: Abnormal gait, decreased activity tolerance, decreased balance, decreased knowledge of use of DME, decreased mobility, difficulty walking, decreased ROM, decreased strength, hypomobility, increased fascial  restrictions, impaired flexibility, and pain.    GOALS: Goals reviewed with patient? No  SHORT TERM GOALS: Target date: 08/17/2023    Will be compliant with appropriate progressive HEP Goal status: MET 07/25/23   2. Rt knee AROM flexion to be at least and extension AROM to be at least 95* Goal status:  on going 08/16/2023    3. Will be independent with edema management strategies  Goal status: MET  08/14/2023 (compression stockings, ice and elevation)   4. Gait pattern to have normalized with LRAD  Goal status: MET 08/14/23 c straight cane   5. Will complete TUG test in 15 seconds or less with LRAD to show improved functional balance  Goal status: on going 08/22/2023     LONG TERM GOALS: Target date: 09/14/2023    MMT to have improved by one grade all weak groups Goal status: on going  08/22/2023    2. Knee AROM to be WNL and pain free Goal status: on going 08/22/2023   3. Will be able to ascend and descend stairs reciprocally with no increase in pain Goal status: on going  08/22/2023    4. Will be able to ambulate community distances and perform all household tasks with no increase in pain  Goal status: on going  08/22/2023    5. PSFS to have improved by at least 3 points  to show improved QOL and subjective improvement  Goal status: on going   08/22/2023    PLAN:  PT FREQUENCY: 2x/week  PT DURATION: 8 weeks  PLANNED INTERVENTIONS: 97750- Physical Performance Testing, 97110-Therapeutic exercises, 97530- Therapeutic activity, 97112- Neuromuscular re-education, 97535- Self Care, [id number]- Manual therapy, [id number]- Gait training, [phone]- Vasopneumatic device, Balance training, Stair training, Taping, Dry Needling, and DME instructions  PLAN FOR NEXT SESSION: Check TUG STG.  Check if she tried ice massage and if it is helping. continue progression of quadriceps strengthening for Rt leg (leg press again, etc.) while addressing flexion AROM.  Balance and functional progressions as appropriate.    Hari Casaus, PT, DPT 08/22/2023, 4:35 PM

## 2023-08-27 ENCOUNTER — Encounter (INDEPENDENT_AMBULATORY_CARE_PROVIDER_SITE_OTHER): Payer: Self-pay | Admitting: Family Medicine

## 2023-08-27 ENCOUNTER — Telehealth (INDEPENDENT_AMBULATORY_CARE_PROVIDER_SITE_OTHER): Payer: Self-pay | Admitting: Family Medicine

## 2023-08-27 ENCOUNTER — Ambulatory Visit (INDEPENDENT_AMBULATORY_CARE_PROVIDER_SITE_OTHER): Admitting: Family Medicine

## 2023-08-27 VITALS — BP 123/78 | HR 77 | Temp 98.7°F | Ht 64.5 in | Wt 219.0 lb

## 2023-08-27 DIAGNOSIS — E559 Vitamin D deficiency, unspecified: Secondary | ICD-10-CM

## 2023-08-27 DIAGNOSIS — I10 Essential (primary) hypertension: Secondary | ICD-10-CM

## 2023-08-27 DIAGNOSIS — E782 Mixed hyperlipidemia: Secondary | ICD-10-CM | POA: Diagnosis not present

## 2023-08-27 DIAGNOSIS — Z6837 Body mass index (BMI) 37.0-37.9, adult: Secondary | ICD-10-CM

## 2023-08-27 DIAGNOSIS — R7303 Prediabetes: Secondary | ICD-10-CM | POA: Diagnosis not present

## 2023-08-27 DIAGNOSIS — E611 Iron deficiency: Secondary | ICD-10-CM

## 2023-08-27 MED ORDER — VITAMIN D (ERGOCALCIFEROL) 1.25 MG (50000 UNIT) PO CAPS: 50000.0000 [IU] | ORAL_CAPSULE | ORAL | 0 refills | Status: DC

## 2023-08-27 NOTE — Telephone Encounter (Signed)
 Pt has concerns about which Vitamin D  she was prescribed. The pt reports she is normally prescribed Vitamin D3 and she knows that one works well with her body but she was wondering why she was prescribed Vitamin D2 this time. Please follow up with the patient.

## 2023-08-27 NOTE — Progress Notes (Signed)
 Marilyn [name], D.O.  ABFM, ABOM Clinical Bariatric Medicine Physician  Office located at: [address] [place], [place]  [address]   Assessment and Plan:  No orders of the defined types were placed in this encounter.  There are no discontinued medications.   Meds ordered this encounter  Medications   DISCONTD: Vitamin D , Ergocalciferol , (DRISDOL ) 1.25 MG (50000 UNIT) CAPS capsule    Sig: Take 1 capsule (50,000 Units total) by mouth every 7 (seven) days.    Dispense:  4 capsule    Refill:  0     FOR THE DISEASE OF OBESITY: Morbid Obesity -- Starting BMI; 37.1 BMI 37.0-37.9, adult 37.01 Assessment & Plan: Since last office visit on 08/13/23 patient's muscle mass has decreased by 1 lb. Fat mass has increased by 0.2 lbs. Total body water has increased by 1.2 lbs.  Counseling done on how various foods will affect these numbers and how to maximize success  Total lbs lost to date: 0 Total weight loss percentage to date: 0 %    Recommended Dietary Goals Marilyn [name] is currently in the action stage of change. As such, her goal is to continue weight management plan.  She has agreed to: continue current plan   Behavioral Intervention We discussed the following today: increasing lean protein intake to established goals, decreasing simple carbohydrates , increasing lower glycemic fruits, avoiding skipping meals, increasing water intake , keeping healthy foods at home, decreasing eating out or consumption of processed foods, and making healthy choices when eating convenient foods, avoiding temptations and identifying enticing environmental cues, and planning for success  Additional resources provided today: Handout on Examples of Low Glycemic Index and Low Calorie Fruits & Vegetables, Handout on CAT 1 meal plan , Handout on Pre-Diabetes education , Provided patient with personalized instruction and demonstration on the use of artificial intelligence for recipes, calorie tracking, and  finding healthier options when eating out. , and Provided with personal guidance and instructions on how to use [employer] for healthy meal ideas and cooking in bulk.  Evidence-based interventions for health behavior change were utilized today including the discussion of self monitoring techniques, problem-solving barriers and SMART goal setting techniques.  Regarding patient's less desirable eating habits and patterns, we employed the technique of small changes.   Pt will specifically work on: n/a     Recommended Physical Activity Goals Marilyn [name] has been advised to work up to 150 minutes of moderate intensity aerobic activity a week and strengthening exercises 2-3 times per week for cardiovascular health, weight loss maintenance and preservation of muscle mass.   She has agreed to: Increase physical activity in their day and reduce sedentary time (increase NEAT). 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 Good compliance and tolerance. [facility] and was last seen by Dr. Annabella Scarce on 08/21/23. A cardiac PET stress test was ordered on 6/24 to assess cardiac function and rule out cardiac causes of dyspnea. She has been working on exercising consistently and started using a [employer], museum to count her steps.  Advised pt to continue current antihypertensive regimen as prescribed. Continue to follow up with cardiology care team as instructed by them. Discussed gradually increasing  the frequency and intensity of exercise throughout the week; aiming for moderate exercise. Continue best efforts to follow a heart-healthy diet via her meal plan. Will continue monitoring condition alongside her PCP and specialists.     Mixed hyperlipidemia Assessment & Plan: Lab Results  Component Value Date   CHOL 158 08/13/2023   HDL 78 08/13/2023   LDLCALC 66 08/13/2023   LDLDIRECT 102 (H) 10/14/2021   TRIG 74 08/13/2023   CHOLHDL 2.0 08/13/2023   Lipid panel was last obtained on 08/13/23; her LDL was above goal. Was started on Crestor  5-6 months ago, in 02/2023. Currently compliant with Crestor  10 mg once daily. Tolerating well with no adverse side effects. Pt is followed by cardiology. Specific LDL goals reviewed. Encouraged pt to gradually increase frequency and intensity of her exercise routine. We extensively discussed several lifestyle modifications today including following her heart-healthy diet by decreasing simple carbs, avoiding saturated and trans fats, and limiting her consumption of red meat to only twice a week. Continue with stating therapy at current dose. Will continue monitoring condition as it relates to her weight loss alongside her PCP/specialists.     Prediabetes- new onset Assessment & Plan: Lab Results  Component Value Date   HGBA1C 5.8 (H) 08/13/2023   HGBA1C 6.4 02/08/2023   HGBA1C 6.2 02/09/2022   INSULIN  11.3 08/13/2023   Her A1c was elevated at 5.8 on 08/13/23. This is a new diagnosis to pt as she states she has never been told she has PreDM in the past. Not currently taking any medications. Also reviewed her CMP; kidney function and folate levels are WNL. She has been limited on the foods she can cook at home, stating she only has an outdoor grill and air fryer available to there at this time.   Reviewed the physiological progression from insulin  resistance to prediabetes and diabetes if poorly controlled. Encouraged pt to continue working on  implementation of her meal plan by increasing her fiber and protein intake and reducing her simple carb/sugar intake. Reviewed how eating off plan foods can affect her blood sugars. Advised pt to drink adequate amounts of water per day, half her body weight in ounces of water per day. Avoid skipping any meals. Provided a handout with information on the pathophysiology of I.R. and PreDM for further education. Will continue to monitor condition alongside her PCP.     Vitamin D  deficiency Assessment & Plan: Lab Results  Component Value Date   VD25OH 27.8 (L) 08/13/2023   VD25OH 34.46 07/27/2015   VD25OH 36.92 07/23/2014   Last vitamin D  was below goal at 27.8 on 08/13/2023. Not currently taking vitamin D  supplementation. Reviewed ideal vitamin D  levels of 50-70. Counseling given on the benefits of at goal vitamin D  levels as it relates to energy levels, moods, and ability to lose weight. Discussed starting pt on supplementation and reviewed risks/benefits. Reviewed her options of starting ERGO for a weekly dose or OTC vit D for daily dose; pt preferred to start on an OTC vit D. Will continue monitor and recheck levels periodically.    Iron deficiency Lab Results  Component Value Date   IRON 74 12/25/2019   TIBC 319 12/25/2019   FERRITIN 110 12/25/2019   Pt has hx of iron deficiency. Most recent iron studies were obtained in 11/2019, as shown above. Pt not currently taking any  iron supplements and reports no symptoms at this time. Provided counseling for iron deficiency, including healthy foods that are rich in iron. Reviewed common signs and symptoms of anemia to monitor in the future. Will continue monitoring alongside PCP.      FOLLOW UP:   Return in about 3 weeks (around 09/17/2023) for 3 week f/u. She was informed of the importance of frequent follow up visits to maximize her success with intensive lifestyle modifications for her multiple health conditions.  Subjective:   Chief  complaint: Obesity Annesha is here to discuss her progress with her obesity treatment plan. She is on the Category 1 Plan with L options and states she is following her eating plan approximately 75 % of the time. She states she is biking or doing knee exercises 45 minutes 2 days per week.  Interval History:  Marilyn [name] is here today for her first follow-up office visit since starting the program with us . Since last OV, pt is has maintained her weight. She reports her AC and stove have not been working recently and are currently in the process of being prepared/replaced. This has limited her ability to cook food at home, however, she reports her husband has started using their grill to still be able to prepare home-cooked meals. She plans to have grilled salmon with brown rice for dinner tonight. She states the plan for the near future is to grill their foods and use their air fryer when they are able to. She is grocery shopping at least once a week.   All blood work/ lab tests that were recently ordered by myself or an outside provider were reviewed with patient today per their request. Extended time was spent counseling her on all new disease processes that were discovered or preexisting ones that are affected by BMI.  she understands that many of these abnormalities will need to monitored regularly along with the current treatment plan of prudent dietary changes, in which we are making each and every office visit, to improve these health parameters.  Pharmacotherapy for weight loss: She is currently taking medications  for medical weight loss.    Review of Systems:  Pertinent positives were addressed with patient today. Reviewed by clinician on day of visit: allergies, medications, problem list, medical history, surgical history, family history, social history, and previous encounter notes.  Weight Summary and Biometrics   Weight Lost Since Last Visit: 0lb  Weight Gained Since Last  Visit: 0lb   Vitals Temp: 98.7 F (37.1 C) BP: 123/78 Pulse Rate: 77 SpO2: 100 %   Anthropometric Measurements Height: 5' 4.5 (1.638 m) Weight: 219 lb (99.3 kg) BMI (Calculated): 37.02 Weight at Last Visit: 219lb Weight Lost Since Last Visit: 0lb Weight Gained Since Last Visit: 0lb Starting Weight: 219lb Total Weight Loss (lbs): 0 lb (0 kg) Peak Weight: 227lb Waist Measurement : 40 inches   Body Composition  Body Fat %: 49.8 % Fat Mass (lbs): 109 lbs Muscle Mass (lbs): 104.4 lbs Total Body Water (lbs): 80.8 lbs Visceral Fat Rating : 16   Other Clinical Data RMR: [id number] Fasting: No Labs: No Today's Visit #: 2 Starting Date: 08/13/23 Comments: second visit     Objective:   PHYSICAL EXAM:  Blood pressure 123/78, pulse 77, temperature 98.7 F (37.1 C), height 5' 4.5 (1.638 m), weight 219 lb (99.3 kg), SpO2 100%. Body mass index is 37.01 kg/m.  General: she is overweight, cooperative and in no acute distress.   HEENT: EOMI, sclerae are anicteric.  Lungs: Normal breathing effort, no conversational dyspnea. M-Sk:  Normal gross ROM * 4 extremities  PSYCH: Has normal mood, affect and thought process. Neurologic: No gross sensory or motor deficits. Well developed, A and O * 3  DIAGNOSTIC DATA REVIEWED:  BMET    Component Value Date/Time   NA 138 08/13/2023 1055   NA 138 11/04/2013 0929   K 4.5 08/13/2023 1055   K 3.8 11/04/2013 0929   CL 101 08/13/2023 1055   CL 107 03/22/2012 0853   CO2 17 (L) 08/13/2023 1055   CO2 23 11/04/2013 0929   GLUCOSE 90 08/13/2023 1055   GLUCOSE 89 06/22/2023 1400   GLUCOSE 100 11/04/2013 0929   GLUCOSE 99 03/22/2012 0853   BUN 11 08/13/2023 1055   BUN 12.1 11/04/2013 0929   CREATININE 0.73 08/13/2023 1055   CREATININE 0.8 11/04/2013 0929   CALCIUM  10.1 08/13/2023 1055   CALCIUM  9.6 11/04/2013 0929   GFRNONAA >60 06/22/2023 1400   GFRAA >60 07/14/2016 2305   Lab Results  Component Value Date   HGBA1C 5.8 (H)  08/13/2023   HGBA1C 6.2 02/09/2022   Lab Results  Component Value Date   INSULIN  11.3 08/13/2023   Lab Results  Component Value Date   TSH 1.420 08/13/2023   CBC    Component Value Date/Time   WBC 8.9 08/13/2023 1055   WBC 10.2 06/22/2023 1400   RBC 5.12 08/13/2023 1055   RBC 5.32 (H) 06/22/2023 1400   HGB 11.9 08/13/2023 1055   HGB 12.2 11/04/2013 0928   HCT 40.1 08/13/2023 1055   HCT 38.9 11/04/2013 0928   PLT 283 08/13/2023 1055   MCV 78 (L) 08/13/2023 1055   MCV 74.7 (L) 11/04/2013 0928   MCH 23.2 (L) 08/13/2023 1055   MCH 23.5 (L) 06/22/2023 1400   MCHC 29.7 (L) 08/13/2023 1055   MCHC 31.0 06/22/2023 1400   RDW 14.4 08/13/2023 1055   RDW 14.7 (H) 11/04/2013 0928   Iron Studies    Component Value Date/Time   IRON 74 12/25/2019 1415   TIBC 319 12/25/2019 1415   FERRITIN 110 12/25/2019 1415   IRONPCTSAT 23 12/25/2019 1415   Lipid Panel     Component Value Date/Time   CHOL 158 08/13/2023 1055   TRIG 74 08/13/2023 1055   HDL 78 08/13/2023 1055   CHOLHDL 2.0 08/13/2023 1055   CHOLHDL 3 02/08/2023 1127   VLDL 14.6 02/08/2023 1127   LDLCALC 66 08/13/2023 1055   LDLDIRECT 102 (H) 10/14/2021 1326   LDLDIRECT 118.9 12/18/2011 1138   Hepatic Function Panel     Component Value Date/Time   PROT 7.4 08/13/2023 1055   PROT 7.2 11/04/2013 0929   ALBUMIN 4.5 08/13/2023 1055   ALBUMIN 3.6 11/04/2013 0929   AST 14 08/13/2023 1055   AST 12 11/04/2013 0929   ALT 13 08/13/2023 1055   ALT 13 11/04/2013 0929   ALKPHOS 104 08/13/2023 1055   ALKPHOS 108 11/04/2013 0929   BILITOT 0.4 08/13/2023 1055   BILITOT 0.28 11/04/2013 0929   BILIDIR 0.0 11/12/2018 0912      Component Value Date/Time   TSH 1.420 08/13/2023 1055   Nutritional Lab Results  Component Value Date   VD25OH 27.8 (L) 08/13/2023   VD25OH 34.46 07/27/2015   VD25OH 36.92 07/23/2014    Attestations:   I, Vernell Forest, acting as a medical scribe for Marilyn Jenkins, DO., have compiled all  relevant documentation for today's office visit on behalf of Marilyn Jenkins, DO, while in  the presence of [employer], DO.  I have reviewed the above documentation for accuracy and completeness, and I agree with the above. Marilyn JINNY [name], D.O.  The 21st Century Cures Act was signed into law in 2016 which includes the topic of electronic health records.  This provides immediate access to information in MyChart.  This includes consultation notes, operative notes, office notes, lab results and pathology reports.  If you have any questions about what you read please let us  know at your next visit so we can discuss your concerns and take corrective action if need be.  We are right here with you.

## 2023-08-28 ENCOUNTER — Ambulatory Visit (INDEPENDENT_AMBULATORY_CARE_PROVIDER_SITE_OTHER): Admitting: Physical Therapy

## 2023-08-28 ENCOUNTER — Encounter: Payer: Self-pay | Admitting: Physical Therapy

## 2023-08-28 DIAGNOSIS — M25561 Pain in right knee: Secondary | ICD-10-CM

## 2023-08-28 DIAGNOSIS — M25661 Stiffness of right knee, not elsewhere classified: Secondary | ICD-10-CM | POA: Diagnosis not present

## 2023-08-28 DIAGNOSIS — M6281 Muscle weakness (generalized): Secondary | ICD-10-CM | POA: Diagnosis not present

## 2023-08-28 DIAGNOSIS — R262 Difficulty in walking, not elsewhere classified: Secondary | ICD-10-CM | POA: Diagnosis not present

## 2023-08-28 DIAGNOSIS — R600 Localized edema: Secondary | ICD-10-CM

## 2023-08-28 NOTE — Therapy (Signed)
 OUTPATIENT PHYSICAL THERAPY LOWER EXTREMITY TREATMENT   Patient Name: Catricia McAdoo Cariker MRN: [id number] DOB:September 04, 1955, 68 y.o., female Today's Date: 08/28/2023  END OF SESSION:  PT End of Session - 08/28/23 1151     Visit Number 12    Number of Visits 17    Date for PT Re-Evaluation 09/14/23    Authorization Type [employer]    Authorization Time Period 07/20/23 to 09/14/23    Progress Note Due on Visit 60    PT Start Time 1145    PT Stop Time 1237    PT Time Calculation (min) 52 min    Activity Tolerance Patient tolerated treatment well;No increased pain;Patient limited by pain    Behavior During Therapy [facility] for tasks assessed/performed              Past Medical History:  Diagnosis Date   Allergy    Anemia    hx   Anxiety    panic disorder   Breast cancer (HCC) 06/06/2011   bc  left breast 3 o'clock dx=invasive ductal ca uoqER/PR=positive   Cancer (HCC)    BREAST - left   Cigarette smoker    Colon polyp    Complication of anesthesia    DIFFICULTY AWAKENING, BP INCREASE AFTER CHOLECYSTECTEMY    COPD (chronic obstructive pulmonary disease) (HCC)    Depression    Diverticulosis of colon    DJD (degenerative joint disease)    Family history of breast cancer    Family history of colon cancer    Family history of pancreatic cancer    Family history of prostate cancer    Gallstones    GERD (gastroesophageal reflux disease)    Pt. denies having GERD. Unable to remove it.   History of anemia    History of bronchitis    History of sarcoidosis    Hyperlipidemia    no meds needed   Hypertension    Incisional breast wound    AT AGE 36 BILATERAL INCISION TO BREAST MADE   Joint pain    Leg swelling    Panic disorder    Personal history of radiation therapy    Pre-diabetes    S/P radiation therapy 08/17/11 - 09/28/11   LLQ - 50 Gy/25 Fractions with Boost of 10 Gy / 5 fractions   Sialoadenitis of submandibular gland 2016   Vitamin D  deficiency    Past Surgical  History:  Procedure Laterality Date   BIOPSY BREAST     left breast  as a teenager  benign   BREAST BIOPSY Left 06/06/2011   BREAST EXCISIONAL BIOPSY Bilateral    BREAST LUMPECTOMY Left 2013   BREAST SURGERY  07/05/11   left breast lumpectomy with needle loc & axillary sln bx   BUNIONECTOMY  2011   bilateral by Dr Lea   CHOLECYSTECTOMY  02/2019   COLONOSCOPY     polyp   cyst removed from right hand  1995   POLYPECTOMY     TONSILLECTOMY  1972   TOTAL ABDOMINAL HYSTERECTOMY  2000   Dr Rosalynn   TOTAL KNEE ARTHROPLASTY Right 07/02/2023   Procedure: ARTHROPLASTY, KNEE, TOTAL;  Surgeon: Jerri Kay HERO, MD;  Location: [facility] OR;  Service: Orthopedics;  Laterality: Right;   TRIGGER FINGER RELEASE  2008   Dr Sissy   Patient Active Problem List   Diagnosis Date Noted   Prediabetes- new onset 08/27/2023   Mixed hyperlipidemia 08/27/2023   Iron deficiency 08/27/2023   Status post total right knee replacement  07/02/2023   Primary osteoarthritis of right knee 07/01/2023   Hypercholesterolemia 05/10/2022   Trochanteric bursitis of right hip 05/10/2022   Preop exam for internal medicine 02/09/2022   Plantar flexed metatarsal bone of right foot 01/11/2022   Grief 09/28/2021   Leg swelling 04/18/2021   Incisional breast wound 04/18/2021   History of sarcoidosis 04/18/2021   History of bronchitis 04/18/2021   GERD (gastroesophageal reflux disease) 04/18/2021   Family history of prostate cancer 04/18/2021   Depression 04/18/2021   Complication of anesthesia 04/18/2021   Colon polyp 04/18/2021   Cigarette smoker 04/18/2021   Cancer (HCC) 04/18/2021   Coronary atherosclerosis 01/30/2021   Aortic atherosclerosis (HCC) 01/30/2021   Primary hypertension 01/14/2021   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   Knee pain, chronic 08/28/2019   Elevated BP without diagnosis of hypertension 08/28/2019   Callus of foot 08/28/2019   Cholelithiasis 12/26/2018   Genetic testing 12/26/2017   Family history  of breast cancer    Family history of pancreatic cancer    Family history of colon cancer    Abdominal pain 07/28/2016   Morbid obesity (HCC) 03/13/2016   Dyspnea 11/09/2015   Rash and nonspecific skin eruption 07/27/2015   Well adult exam 07/23/2014   Sialoadenitis 06/02/2014   Edema 07/07/2013   AB (asthmatic bronchitis) 06/23/2013   Anxiety    Malignant neoplasm of upper-outer quadrant of left breast in female, 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   Allergy 06/06/2011   COLONIC POLYPS 01/25/2010   DIVERTICULOSIS OF COLON 01/25/2010   DEGENERATIVE JOINT DISEASE 11/06/2008   PANIC DISORDER 07/23/2008   Sarcoidosis 10/13/2007   Vitamin D  deficiency 10/13/2007   ANEMIA 10/13/2007   BRONCHITIS 10/13/2007   Dyslipidemia 04/23/2007   Adjustment disorder with mixed anxiety and depressed mood 04/23/2007   Venous (peripheral) insufficiency 04/23/2007   HEADACHE 04/23/2007    PCP: Garald Scrape MD   REFERRING PROVIDER: Jule Ronal CROME, PA-C  REFERRING DIAG: Diagnosis [phone] (ICD-10-CM) - Status post total right knee replacement  THERAPY DIAG:  Stiffness of right knee, not elsewhere classified  Difficulty in walking, not elsewhere classified  Muscle weakness (generalized)  Localized edema  Acute pain of right knee  Rationale for Evaluation and Treatment: Rehabilitation  ONSET DATE: Surgery 07/02/23  SUBJECTIVE:   SUBJECTIVE STATEMENT: She has not been to fitness center.  She has been doing her exercises during awake hours trying not to sit too long.   Ice massage seems to help.  She is elevating as advised also.   PERTINENT HISTORY: See above   PAIN:  (2  hydrocodone  before PT) NPRS scale: at rest  0-4/10 standing & walking 1-3/10 and trying bend  0-6/10  Pain location: Right knee Pain description: Tightness Aggravating factors: Flexion Relieving factors: ice, elevation, not bending knee   PRECAUTIONS: Fall  WEIGHT BEARING RESTRICTIONS:  No  FALLS:  Has patient fallen in last 6 months? No, no close calls, no FOF   LIVING ENVIRONMENT: Lives with: lives with their spouse Lives in: House/apartment Stairs: 5 STE, has steep flight of steps inside home but stair lift available for use  Has following equipment at home: Vannie - 2 wheeled, Shower bench, and stair lift, cane   OCCUPATION: retiredHydrographic surveyor at [employer]   PATIENT GOALS: be able to move without pain, get more mobile   NEXT MD VISIT: 09/12/23 Dr. Jerri  OBJECTIVE:  Note: Objective measures were completed at Evaluation unless otherwise noted.   PATIENT SURVEYS:   Patient-Specific Activity Scoring  Scheme  0 represents "unable to perform." 10 represents "able to perform at prior level. 0 1 2 3 4 5 6 7 8 9  10 (Date and Score)   Activity Eval   08/16/23  1. Getting up from chairs   5    6  2. Picking something up  3  3  3. Getting comfortable in bed  1 5  Score 3 4.67   Total score = sum of the activity scores/number of activities Minimum detectable change (90%CI) for average score = 2 points Minimum detectable change (90%CI) for single activity score = 3 points  COGNITION: Overall cognitive status: Within functional limits for tasks assessed     SENSATION: Not tested  EDEMA:  Appropriate for post-op condition   LOWER EXTREMITY ROM:  Active ROM Right eval Left eval Rt 07/25/23 Rt 07/31/23 Rt 08/14/23 Right 08/16/23 Right 08/28/23  Hip flexion         Hip extension         Hip abduction         Hip adduction         Hip internal rotation         Hip external rotation         Knee flexion Supine heel slide 52*, HS curl in sitting 72*  Supine AA: 65 P: 78 Supine AA: 80  Supine A: 85 P: 90 Active 90 Seated P: 96* A: 92*  Knee extension 9* supine with heel prop   A: -10 P: -8 AA: -5 A: -7 P: -5 Active -2   Ankle dorsiflexion         Ankle plantarflexion         Ankle inversion         Ankle eversion          (Blank rows = not  tested)  LOWER EXTREMITY MMT:  MMT Right eval Left eval Right 08/08/2023  Knee flexion 3- 4+ 4/5  Knee extension 4- 4+ 4+/5   (Blank rows = not tested)    FUNCTIONAL TESTS:  Eval:  Timed up and go (TUG): 29.7 seconds RW 2 3 minute walk test: 245ft, RW   GAIT: Eval:  Distance walked: 251ft Assistive device utilized: Environmental consultant - 2 wheeled Level of assistance: Modified independence Comments: limited heel toe pattern, antalgic gait, tends to lean heavily on RW                    TODAY'S  TREATMENT         DATE: 08/28/2023   Therapeutic Exercise: Recumbent bike Seat 7 rocking flexion stretch for 3 min, full revolutions backwards 3 min (initial 3 reps with PT CGA), full revolutions forward 3 min (PT CGA first 5 revolutions) Seated LAQ and active knee flexion with contralateral LE opposing motion 15 reps. Supine gravity assisted (holding femur vertical) active knee flexion 10 reps.   Therapeutic Activities: Leg Press BLEs 93# 15 reps with PT cues on technique;  RLE only 43# 15 reps  PT gave pt info for [employer] to guide for optimal shoes.  Pt verbalized understanding.    Manual Therapy: PROM with overpressure and contract relax.    Self-care: PT demo and verbal cues on percussion gun and rolling pin to quad.   Patient verbalized and return demonstration understanding.  Vaso Right Knee 10 minutes medium compression 34* with elevation    TREATMENT         DATE: 08/22/2023 Therapeutic Exercise: Recumbent bike Seat  7 rocking flexion stretch for 3 min, full revolutions backwards 3 min (initial 3 reps with PT CGA), full revolutions forward 3 min (PT CGA first 5 revolutions) RLE Seated heel slides with foot on pillow case 10 reps: knee ext / quad set 5 sec & active knee flexion with end range assist with LLE  /hamstring set 5 sec.  Supine RLE heel slide with strap & 12 ball knee flexion stretch 5 sec and leg press/ knee ext 5 sec 10 reps PT encouraged patient to perform the above  to exercises as part of her HEP as both help to work on full extension to max flexion.  Patient verbalized understanding  Therapeutic Activities: Leg Press BLEs 93# 15 reps with PT cues on technique;  RLE only 43# 15 reps   Self-care: PT demo and verbal cues on ice massage.  Patient verbalized and return demonstration understanding.  Vaso Right Knee 10 minutes medium compression 34* with elevation   TREATMENT         DATE: 08/20/2023 Therapeutic Exercise: Recumbent bike Seat 7 rocking flexion stretch for 3 min, full revolutions backwards 3 min (initial 90 sec with PT CGA), full revolutions forward 3 min (PT CGA first 5 revolutions)  Therapeutic Activities: PT demo & verbal cues on weight shift onto RLE 3 sec hold 5 reps prior to walking after sitting >/= 5 min.  Pt return demo understanding & verbalizes less initial pain.  Leg Press BLEs 87# 15 reps with PT cues on technique;  RLE only 37# 15 reps  PT demo & verbal cues on using 24 bar stool for modified standing to increase standing tolerance in kitchen.  Sit to stand without UE assist 5 reps and stand to sit light touch to ensure stool under pelvis 5 reps. PT demo & verbal cues on sit to/from stand technique. Pt able to perform 5 reps with BUE assist with improved equal BLE use.    Gait Training: Pre-gait weight shift over stance LE with knee ext in //bars and terminal stance knee flexion.  Carry over to gait with cane.   Vaso Right Knee 10 minutes medium compression 34* with elevation    PATIENT EDUCATION:  Education details: exam findings, POC, HEP  Person educated: Patient Education method: Explanation, Demonstration, and Handouts Education comprehension: verbalized understanding, returned demonstration, and needs further education  HOME EXERCISE PROGRAM: Access Code: [email or web address] URL: [email or web address] Date: 08/16/2023 Prepared by: Lamar Ivory  Exercises - Standing Heel Raise with Support  - 1 x daily -  7 x weekly - 2 sets - 10 reps - Hip Abduction with Resistance Loop  - 1 x daily - 7 x weekly - 2 sets - 10 reps - Hip Extension with Resistance Loop  - 1 x daily - 7 x weekly - 2 sets - 10 reps - Sit to Stand with Arm Reach Toward Target  - 1 x daily - 7 x weekly - 1 sets - 10 reps - Seated Knee Extension with Resistance  - 1 x daily - 7 x weekly - 2 sets - 10 reps - Seated Hamstring Curls with Resistance  - 1 x daily - 7 x weekly - 2 sets - 10 reps - Seated Knee Flexion Stretch  - 2-3 x daily - 7 x weekly - 2-3 sets - 20-30 sec hold - Seated Quad Set  - 2-3 x daily - 7 x weekly - 2-3 sets - 10 reps - 3 sec hold - Seated Knee Flexion AAROM  -  3 x daily - 7 x weekly - 1 sets - 10 reps - 10 seconds hold - Supine Quadricep Sets  - 3 x daily - 7 x weekly - 2 sets - 10 reps - 5 second hold  ASSESSMENT:  CLINICAL IMPRESSION: Patient had improved range of motion.  Patient appears to respond well to manual activities that relax quadriceps.  She ambulates with cane with antalgic pattern.  Patient continues to benefit from skilled physical therapy to recover from TKA.  OBJECTIVE IMPAIRMENTS: Abnormal gait, decreased activity tolerance, decreased balance, decreased knowledge of use of DME, decreased mobility, difficulty walking, decreased ROM, decreased strength, hypomobility, increased fascial restrictions, impaired flexibility, and pain.    GOALS: Goals reviewed with patient? No  SHORT TERM GOALS: Target date: 08/17/2023    Will be compliant with appropriate progressive HEP Goal status: MET 07/25/23   2. Rt knee AROM flexion to be at least and extension AROM to be at least 95* Goal status:  on going   08/28/2023    3. Will be independent with edema management strategies  Goal status: MET  08/14/2023 (compression stockings, ice and elevation)   4. Gait pattern to have normalized with LRAD  Goal status: MET 08/14/23 c straight cane   5. Will complete TUG test in 15 seconds or less with LRAD  to show improved functional balance  Goal status: on going    08/28/2023     LONG TERM GOALS: Target date: 09/14/2023    MMT to have improved by one grade all weak groups Goal status: on going  08/28/2023    2. Knee AROM to be WNL and pain free Goal status: on going 08/28/2023   3. Will be able to ascend and descend stairs reciprocally with no increase in pain Goal status: on going  08/28/2023    4. Will be able to ambulate community distances and perform all household tasks with no increase in pain  Goal status: on going 08/28/2023    5. PSFS to have improved by at least 3 points to show improved QOL and subjective improvement  Goal status: on going   08/28/2023    PLAN:  PT FREQUENCY: 2x/week  PT DURATION: 8 weeks  PLANNED INTERVENTIONS: 97750- Physical Performance Testing, 97110-Therapeutic exercises, 97530- Therapeutic activity, 97112- Neuromuscular re-education, 97535- Self Care, [id number]- Manual therapy, [id number]- Gait training, 97016- Vasopneumatic device, Balance training, Stair training, Taping, Dry Needling, and DME instructions  PLAN FOR NEXT SESSION: Check TUG STG.  Manual therapy and therapeutic exercise.  continue progression of quadriceps strengthening for Rt leg (leg press again, etc.) while addressing flexion AROM.  Balance and functional progressions as appropriate.    Yvonnia Tango, PT, DPT 08/28/2023, 1:00 PM

## 2023-08-29 ENCOUNTER — Other Ambulatory Visit (INDEPENDENT_AMBULATORY_CARE_PROVIDER_SITE_OTHER): Payer: Self-pay | Admitting: Family Medicine

## 2023-08-29 DIAGNOSIS — E559 Vitamin D deficiency, unspecified: Secondary | ICD-10-CM

## 2023-08-29 MED ORDER — CHOLECALCIFEROL 1.25 MG (50000 UT) PO TABS: ORAL_TABLET | ORAL | 0 refills | Status: DC

## 2023-08-29 NOTE — Telephone Encounter (Signed)
 Call Pt to inform her that it would be better if she gotten her D3 Rx over the counter. Pt verbalized understanding. Pt had no questions at this time but was encouraged to call back if questions arise.

## 2023-08-30 ENCOUNTER — Encounter: Payer: Self-pay | Admitting: Physical Therapy

## 2023-08-30 ENCOUNTER — Ambulatory Visit: Admitting: Physical Therapy

## 2023-08-30 DIAGNOSIS — M6281 Muscle weakness (generalized): Secondary | ICD-10-CM | POA: Diagnosis not present

## 2023-08-30 DIAGNOSIS — R262 Difficulty in walking, not elsewhere classified: Secondary | ICD-10-CM | POA: Diagnosis not present

## 2023-08-30 DIAGNOSIS — R600 Localized edema: Secondary | ICD-10-CM

## 2023-08-30 DIAGNOSIS — M25661 Stiffness of right knee, not elsewhere classified: Secondary | ICD-10-CM | POA: Diagnosis not present

## 2023-08-30 DIAGNOSIS — M25561 Pain in right knee: Secondary | ICD-10-CM

## 2023-08-30 NOTE — Therapy (Signed)
 OUTPATIENT PHYSICAL THERAPY LOWER EXTREMITY TREATMENT   Patient Name: Marilyn [name] MRN: [id number] DOB:[birth year], 68 y.o., female Today's Date: 08/30/2023  END OF SESSION:  PT End of Session - 08/30/23 1142     Visit Number 13    Number of Visits 17    Date for PT Re-Evaluation 09/14/23    Authorization Type [employer]    Authorization Time Period 07/20/23 to 09/14/23    Progress Note Due on Visit 60    PT Start Time 1142    PT Stop Time 1240    PT Time Calculation (min) 58 min    Activity Tolerance Patient tolerated treatment well;No increased pain;Patient limited by pain    Behavior During Therapy [facility] for tasks assessed/performed               Past Medical History:  Diagnosis Date   Allergy    Anemia    hx   Anxiety    panic disorder   Breast cancer (HCC) 06/06/2011   bc  left breast 3 o'clock dx=invasive ductal ca uoqER/PR=positive   Cancer (HCC)    BREAST - left   Cigarette smoker    Colon polyp    Complication of anesthesia    DIFFICULTY AWAKENING, BP INCREASE AFTER CHOLECYSTECTEMY    COPD (chronic obstructive pulmonary disease) (HCC)    Depression    Diverticulosis of colon    DJD (degenerative joint disease)    Family history of breast cancer    Family history of colon cancer    Family history of pancreatic cancer    Family history of prostate cancer    Gallstones    GERD (gastroesophageal reflux disease)    Pt. denies having GERD. Unable to remove it.   History of anemia    History of bronchitis    History of sarcoidosis    Hyperlipidemia    no meds needed   Hypertension    Incisional breast wound    AT AGE 41 BILATERAL INCISION TO BREAST MADE   Joint pain    Leg swelling    Panic disorder    Personal history of radiation therapy    Pre-diabetes    S/P radiation therapy 08/17/11 - 09/28/11   LLQ - 50 Gy/25 Fractions with Boost of 10 Gy / 5 fractions   Sialoadenitis of submandibular gland 2016   Vitamin D  deficiency    Past  Surgical History:  Procedure Laterality Date   BIOPSY BREAST     left breast  as a teenager  benign   BREAST BIOPSY Left 06/06/2011   BREAST EXCISIONAL BIOPSY Bilateral    BREAST LUMPECTOMY Left 2013   BREAST SURGERY  07/05/11   left breast lumpectomy with needle loc & axillary sln bx   BUNIONECTOMY  2011   bilateral by Dr Lea   CHOLECYSTECTOMY  02/2019   COLONOSCOPY     polyp   cyst removed from right hand  1995   POLYPECTOMY     TONSILLECTOMY  1972   TOTAL ABDOMINAL HYSTERECTOMY  2000   Dr Rosalynn   TOTAL KNEE ARTHROPLASTY Right 07/02/2023   Procedure: ARTHROPLASTY, KNEE, TOTAL;  Surgeon: Jerri Kay HERO, MD;  Location: [facility] OR;  Service: Orthopedics;  Laterality: Right;   TRIGGER FINGER RELEASE  2008   Dr Sissy   Patient Active Problem List   Diagnosis Date Noted   Prediabetes- new onset 08/27/2023   Mixed hyperlipidemia 08/27/2023   Iron deficiency 08/27/2023   Status post total right knee  replacement 07/02/2023   Primary osteoarthritis of right knee 07/01/2023   Hypercholesterolemia 05/10/2022   Trochanteric bursitis of right hip 05/10/2022   Preop exam for internal medicine 02/09/2022   Plantar flexed metatarsal bone of right foot 01/11/2022   Grief 09/28/2021   Leg swelling 04/18/2021   Incisional breast wound 04/18/2021   History of sarcoidosis 04/18/2021   History of bronchitis 04/18/2021   GERD (gastroesophageal reflux disease) 04/18/2021   Family history of prostate cancer 04/18/2021   Depression 04/18/2021   Complication of anesthesia 04/18/2021   Colon polyp 04/18/2021   Cigarette smoker 04/18/2021   Cancer (HCC) 04/18/2021   Coronary atherosclerosis 01/30/2021   Aortic atherosclerosis (HCC) 01/30/2021   Primary hypertension 01/14/2021   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   Knee pain, chronic 08/28/2019   Elevated BP without diagnosis of hypertension 08/28/2019   Callus of foot 08/28/2019   Cholelithiasis 12/26/2018   Genetic testing 12/26/2017   Family  history of breast cancer    Family history of pancreatic cancer    Family history of colon cancer    Abdominal pain 07/28/2016   Morbid obesity (HCC) 03/13/2016   Dyspnea 11/09/2015   Rash and nonspecific skin eruption 07/27/2015   Well adult exam 07/23/2014   Sialoadenitis 06/02/2014   Edema 07/07/2013   AB (asthmatic bronchitis) 06/23/2013   Anxiety    Malignant neoplasm of upper-outer quadrant of left breast in female, 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   Allergy 06/06/2011   COLONIC POLYPS 01/25/2010   DIVERTICULOSIS OF COLON 01/25/2010   DEGENERATIVE JOINT DISEASE 11/06/2008   PANIC DISORDER 07/23/2008   Sarcoidosis 10/13/2007   Vitamin D  deficiency 10/13/2007   ANEMIA 10/13/2007   BRONCHITIS 10/13/2007   Dyslipidemia 04/23/2007   Adjustment disorder with mixed anxiety and depressed mood 04/23/2007   Venous (peripheral) insufficiency 04/23/2007   HEADACHE 04/23/2007    PCP: Garald Scrape MD   REFERRING PROVIDER: Jule Ronal CROME, PA-C  REFERRING DIAG: Diagnosis [phone] (ICD-10-CM) - Status post total right knee replacement  THERAPY DIAG:  Stiffness of right knee, not elsewhere classified  Difficulty in walking, not elsewhere classified  Muscle weakness (generalized)  Localized edema  Acute pain of right knee  Rationale for Evaluation and Treatment: Rehabilitation  ONSET DATE: Surgery 07/02/23  SUBJECTIVE:   SUBJECTIVE STATEMENT: She is doing the ice massage and helps.  She checked into percussion gun.  She bought new shoes.   PERTINENT HISTORY: See above   PAIN:  (2  hydrocodone  before PT) NPRS scale: at rest 0-4/10 standing & walking 1-4/10 and trying bend  0-6/10  Pain location: Right knee Pain description: Tightness Aggravating factors: Flexion Relieving factors: ice, elevation, not bending knee   PRECAUTIONS: Fall  WEIGHT BEARING RESTRICTIONS: No  FALLS:  Has patient fallen in last 6 months? No, no close calls, no FOF    LIVING ENVIRONMENT: Lives with: lives with their spouse Lives in: House/apartment Stairs: 5 STE, has steep flight of steps inside home but stair lift available for use  Has following equipment at home: Vannie - 2 wheeled, Shower bench, and stair lift, cane   OCCUPATION: retiredHydrographic surveyor at [employer]   PATIENT GOALS: be able to move without pain, get more mobile   NEXT MD VISIT: 09/12/23 Dr. Jerri  OBJECTIVE:  Note: Objective measures were completed at Evaluation unless otherwise noted.   PATIENT SURVEYS:   Patient-Specific Activity Scoring Scheme  0 represents "unable to perform." 10 represents "able to perform at prior level. 0 1 2  3 4 5 6 7 8 9 10  (Date and Score)   Activity Eval   08/16/23  1. Getting up from chairs   5    6  2. Picking something up  3  3  3. Getting comfortable in bed  1 5  Score 3 4.67   Total score = sum of the activity scores/number of activities Minimum detectable change (90%CI) for average score = 2 points Minimum detectable change (90%CI) for single activity score = 3 points  COGNITION: Overall cognitive status: Within functional limits for tasks assessed     SENSATION: Not tested  EDEMA:  Appropriate for post-op condition   LOWER EXTREMITY ROM:  Active ROM Right eval Left eval Rt 07/25/23 Rt 07/31/23 Rt 08/14/23 Right 08/16/23 Right 08/28/23  Hip flexion         Hip extension         Hip abduction         Hip adduction         Hip internal rotation         Hip external rotation         Knee flexion Supine heel slide 52*, HS curl in sitting 72*  Supine AA: 65 P: 78 Supine AA: 80  Supine A: 85 P: 90 Active 90 Seated P: 96* A: 92*  Knee extension 9* supine with heel prop   A: -10 P: -8 AA: -5 A: -7 P: -5 Active -2   Ankle dorsiflexion         Ankle plantarflexion         Ankle inversion         Ankle eversion          (Blank rows = not tested)  LOWER EXTREMITY MMT:  MMT Right eval Left eval Right 08/08/2023  Knee  flexion 3- 4+ 4/5  Knee extension 4- 4+ 4+/5   (Blank rows = not tested)    FUNCTIONAL TESTS:  08/30/2023:  TUG without device 19.5 sec  Eval:  Timed up and go (TUG): 29.7 seconds RW 2 3 minute walk test: 219ft, RW   GAIT: Eval:  Distance walked: 212ft Assistive device utilized: Environmental consultant - 2 wheeled Level of assistance: Modified independence Comments: limited heel toe pattern, antalgic gait, tends to lean heavily on RW                    TODAY'S  TREATMENT         DATE: 08/30/2023   Therapeutic Exercise: Recumbent bike Seat 7 rocking flexion stretch for 2.5 min, full revolutions backwards 2.5 min, full revolutions forward 3 min level 1 for 5 min.   Standing knee flexion stretch with foot on second step 10-second hold 3 reps 3 sets Standing Gastroc stretch on scapular depression 30 sec hold 3 reps Standing hamstring stretch with foot on second step 30 sec hold 3 reps Supine hook lying hip flexor stretch with leg over edge of bed 30 sec hold 3 reps Supine hook lying quad stretch with leg over edge strap knee flexion 30 sec hold 3 reps PT updated HEP with handout, verbal cues for patient on vaso machine.  Patient verbalized understanding.  Therapeutic Activities: See objective for TUG Sit to / from stand - PT demo & verbal cues on technique.  Pt performed from 18 chair 5 reps with armrests  PT demo and verbal cues on negotiating stairs utilizing RLE: Descending with step to pattern 5 steps 2 rails, 3 steps left  rail  /RUE cane, 3 steps & 4 steps right rail /LUE cane all with supervision and PT cues.  Ascending 2 rails alternating pattern for 11 steps and left rail /RUE cane for steps step to pattern.  Patient verbalizes general understanding of technique to utilize right leg to negotiate stairs.  Vaso Right Knee 10 minutes medium compression 34* with elevation    TREATMENT         DATE: 08/28/2023   Therapeutic Exercise: Recumbent bike Seat 7 rocking flexion stretch for 3 min, full  revolutions backwards 3 min (initial 3 reps with PT CGA), full revolutions forward 3 min (PT CGA first 5 revolutions) Seated LAQ and active knee flexion with contralateral LE opposing motion 15 reps. Supine gravity assisted (holding femur vertical) active knee flexion 10 reps.   Therapeutic Activities: Leg Press BLEs 93# 15 reps with PT cues on technique;  RLE only 43# 15 reps  PT gave pt info for [employer] to guide for optimal shoes.  Pt verbalized understanding.    Manual Therapy: PROM with overpressure and contract relax.    Self-care: PT demo and verbal cues on percussion gun and rolling pin to quad.   Patient verbalized and return demonstration understanding.  Vaso Right Knee 10 minutes medium compression 34* with elevation    TREATMENT         DATE: 08/22/2023 Therapeutic Exercise: Recumbent bike Seat 7 rocking flexion stretch for 3 min, full revolutions backwards 3 min (initial 3 reps with PT CGA), full revolutions forward 3 min (PT CGA first 5 revolutions) RLE Seated heel slides with foot on pillow case 10 reps: knee ext / quad set 5 sec & active knee flexion with end range assist with LLE  /hamstring set 5 sec.  Supine RLE heel slide with strap & 12 ball knee flexion stretch 5 sec and leg press/ knee ext 5 sec 10 reps PT encouraged patient to perform the above to exercises as part of her HEP as both help to work on full extension to max flexion.  Patient verbalized understanding  Therapeutic Activities: Leg Press BLEs 93# 15 reps with PT cues on technique;  RLE only 43# 15 reps   Self-care: PT demo and verbal cues on ice massage.  Patient verbalized and return demonstration understanding.  Vaso Right Knee 10 minutes medium compression 34* with elevation    PATIENT EDUCATION:  Education details: exam findings, POC, HEP  Person educated: Patient Education method: Explanation, Demonstration, and Handouts Education comprehension: verbalized understanding, returned  demonstration, and needs further education  HOME EXERCISE PROGRAM: Access Code: [email or web address] URL: [email or web address] Date: 08/30/2023 Prepared by: Grayce Spatz  Exercises - Standing Heel Raise with Support  - 1 x daily - 7 x weekly - 2 sets - 10 reps - Hip Abduction with Resistance Loop  - 1 x daily - 7 x weekly - 2 sets - 10 reps - Hip Extension with Resistance Loop  - 1 x daily - 7 x weekly - 2 sets - 10 reps - Sit to Stand with Arm Reach Toward Target  - 1 x daily - 7 x weekly - 1 sets - 10 reps - Seated Knee Extension with Resistance  - 1 x daily - 7 x weekly - 2 sets - 10 reps - Seated Hamstring Curls with Resistance  - 1 x daily - 7 x weekly - 2 sets - 10 reps - Seated Knee Flexion Stretch  - 2-3 x daily - 7 x weekly - 2-3 sets -  20-30 sec hold - Seated Quad Set  - 2-3 x daily - 7 x weekly - 2-3 sets - 10 reps - 3 sec hold - Seated Knee Flexion AAROM  - 3 x daily - 7 x weekly - 1 sets - 10 reps - 10 seconds hold - Supine Quadricep Sets  - 3 x daily - 7 x weekly - 2 sets - 10 reps - 5 second hold - Standing Knee Flexion Stretch on Step  - 1-2 x daily - 7 x weekly - 3 sets - 3 reps - 10 seconds hold - Standing Hamstring Stretch with Step (Mirrored)  - 1-2 x daily - 7 x weekly - 1 sets - 3 reps - 30 seconds hold - Modified Thomas Stretch  - 1-2 x daily - 7 x weekly - 1 sets - 3 reps - 30 seconds hold - Supine Quadriceps Stretch with Strap on Table (Mirrored)  - 1-2 x daily - 7 x weekly - 1 sets - 3 reps - 30 seconds hold - Gastroc Stretch on Step  - 1-2 x daily - 7 x weekly - 1 sets - 3 reps - 30 seconds hold  ASSESSMENT:  CLINICAL IMPRESSION: Patient appears to understand updated HEP to address stretches.  She also improved her ability to utilize right leg to negotiate stairs.  Patient continues to benefit from skilled physical therapy to recover from TKA.  OBJECTIVE IMPAIRMENTS: Abnormal gait, decreased activity tolerance, decreased balance, decreased knowledge of use  of DME, decreased mobility, difficulty walking, decreased ROM, decreased strength, hypomobility, increased fascial restrictions, impaired flexibility, and pain.    GOALS: Goals reviewed with patient? No  SHORT TERM GOALS: Target date: 08/17/2023    Will be compliant with appropriate progressive HEP Goal status: MET 07/25/23   2. Rt knee AROM flexion to be at least and extension AROM to be at least 95* Goal status: Met 08/28/2023    3. Will be independent with edema management strategies  Goal status: MET  08/14/2023 (compression stockings, ice and elevation)   4. Gait pattern to have normalized with LRAD  Goal status: MET 08/14/23 c straight cane   5. Will complete TUG test in 15 seconds or less with LRAD to show improved functional balance  Goal status: Improved by 10 seconds but not to target   08/30/2023     LONG TERM GOALS: Target date: 09/14/2023    MMT to have improved by one grade all weak groups Goal status: on going  08/28/2023    2. Knee AROM to be WNL and pain free Goal status: on going 08/28/2023   3. Will be able to ascend and descend stairs reciprocally with no increase in pain Goal status: on going  08/28/2023    4. Will be able to ambulate community distances and perform all household tasks with no increase in pain  Goal status: on going 08/28/2023    5. PSFS to have improved by at least 3 points to show improved QOL and subjective improvement  Goal status: on going   08/28/2023    PLAN:  PT FREQUENCY: 2x/week  PT DURATION: 8 weeks  PLANNED INTERVENTIONS: 97750- Physical Performance Testing, 97110-Therapeutic exercises, 97530- Therapeutic activity, 97112- Neuromuscular re-education, 97535- Self Care, [id number]- Manual therapy, [id number]- Gait training, [phone]- Vasopneumatic device, Balance training, Stair training, Taping, Dry Needling, and DME instructions  PLAN FOR NEXT SESSION: Check on updated HEP and stairs at home.  manual therapy and therapeutic exercise.   continue progression of quadriceps strengthening for  Rt leg (leg press again, etc.) while addressing flexion AROM.  Balance and functional progressions as appropriate.    [name] Burgueno, PT, DPT 08/30/2023, 1:00 PM

## 2023-09-04 ENCOUNTER — Ambulatory Visit (INDEPENDENT_AMBULATORY_CARE_PROVIDER_SITE_OTHER): Payer: Self-pay | Admitting: Physical Therapy

## 2023-09-04 ENCOUNTER — Encounter: Payer: Self-pay | Admitting: Physical Therapy

## 2023-09-04 DIAGNOSIS — R262 Difficulty in walking, not elsewhere classified: Secondary | ICD-10-CM | POA: Diagnosis not present

## 2023-09-04 DIAGNOSIS — R600 Localized edema: Secondary | ICD-10-CM | POA: Diagnosis not present

## 2023-09-04 DIAGNOSIS — M25561 Pain in right knee: Secondary | ICD-10-CM

## 2023-09-04 DIAGNOSIS — M25661 Stiffness of right knee, not elsewhere classified: Secondary | ICD-10-CM

## 2023-09-04 DIAGNOSIS — M6281 Muscle weakness (generalized): Secondary | ICD-10-CM

## 2023-09-04 NOTE — Therapy (Signed)
 OUTPATIENT PHYSICAL THERAPY LOWER EXTREMITY TREATMENT   Patient Name: Marilyn [name] MRN: [id number] DOB:10-02-1955, 68 y.o., female Today's Date: 09/04/2023  END OF SESSION:  PT End of Session - 09/04/23 0939     Visit Number 14    Number of Visits 17    Date for PT Re-Evaluation 09/14/23    Authorization Type [employer]    Authorization Time Period 07/20/23 to 09/14/23    Progress Note Due on Visit 60    PT Start Time 0930    PT Stop Time 1020    PT Time Calculation (min) 50 min    Activity Tolerance Patient tolerated treatment well;No increased pain;Patient limited by pain    Behavior During Therapy [facility] for tasks assessed/performed                Past Medical History:  Diagnosis Date   Allergy    Anemia    hx   Anxiety    panic disorder   Breast cancer (HCC) 06/06/2011   bc  left breast 3 o'clock dx=invasive ductal ca uoqER/PR=positive   Cancer (HCC)    BREAST - left   Cigarette smoker    Colon polyp    Complication of anesthesia    DIFFICULTY AWAKENING, BP INCREASE AFTER CHOLECYSTECTEMY    COPD (chronic obstructive pulmonary disease) (HCC)    Depression    Diverticulosis of colon    DJD (degenerative joint disease)    Family history of breast cancer    Family history of colon cancer    Family history of pancreatic cancer    Family history of prostate cancer    Gallstones    GERD (gastroesophageal reflux disease)    Pt. denies having GERD. Unable to remove it.   History of anemia    History of bronchitis    History of sarcoidosis    Hyperlipidemia    no meds needed   Hypertension    Incisional breast wound    AT AGE 65 BILATERAL INCISION TO BREAST MADE   Joint pain    Leg swelling    Panic disorder    Personal history of radiation therapy    Pre-diabetes    S/P radiation therapy 08/17/11 - 09/28/11   LLQ - 50 Gy/25 Fractions with Boost of 10 Gy / 5 fractions   Sialoadenitis of submandibular gland 2016   Vitamin D  deficiency    Past  Surgical History:  Procedure Laterality Date   BIOPSY BREAST     left breast  as a teenager  benign   BREAST BIOPSY Left 06/06/2011   BREAST EXCISIONAL BIOPSY Bilateral    BREAST LUMPECTOMY Left 2013   BREAST SURGERY  07/05/11   left breast lumpectomy with needle loc & axillary sln bx   BUNIONECTOMY  2011   bilateral by Dr Lea   CHOLECYSTECTOMY  02/2019   COLONOSCOPY     polyp   cyst removed from right hand  1995   POLYPECTOMY     TONSILLECTOMY  1972   TOTAL ABDOMINAL HYSTERECTOMY  2000   Dr Rosalynn   TOTAL KNEE ARTHROPLASTY Right 07/02/2023   Procedure: ARTHROPLASTY, KNEE, TOTAL;  Surgeon: Jerri Kay HERO, MD;  Location: [facility] OR;  Service: Orthopedics;  Laterality: Right;   TRIGGER FINGER RELEASE  2008   Dr Sissy   Patient Active Problem List   Diagnosis Date Noted   Prediabetes- new onset 08/27/2023   Mixed hyperlipidemia 08/27/2023   Iron deficiency 08/27/2023   Status post total right  knee replacement 07/02/2023   Primary osteoarthritis of right knee 07/01/2023   Hypercholesterolemia 05/10/2022   Trochanteric bursitis of right hip 05/10/2022   Preop exam for internal medicine 02/09/2022   Plantar flexed metatarsal bone of right foot 01/11/2022   Grief 09/28/2021   Leg swelling 04/18/2021   Incisional breast wound 04/18/2021   History of sarcoidosis 04/18/2021   History of bronchitis 04/18/2021   GERD (gastroesophageal reflux disease) 04/18/2021   Family history of prostate cancer 04/18/2021   Depression 04/18/2021   Complication of anesthesia 04/18/2021   Colon polyp 04/18/2021   Cigarette smoker 04/18/2021   Cancer (HCC) 04/18/2021   Coronary atherosclerosis 01/30/2021   Aortic atherosclerosis (HCC) 01/30/2021   Primary hypertension 01/14/2021   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   Knee pain, chronic 08/28/2019   Elevated BP without diagnosis of hypertension 08/28/2019   Callus of foot 08/28/2019   Cholelithiasis 12/26/2018   Genetic testing 12/26/2017   Family  history of breast cancer    Family history of pancreatic cancer    Family history of colon cancer    Abdominal pain 07/28/2016   Morbid obesity (HCC) 03/13/2016   Dyspnea 11/09/2015   Rash and nonspecific skin eruption 07/27/2015   Well adult exam 07/23/2014   Sialoadenitis 06/02/2014   Edema 07/07/2013   AB (asthmatic bronchitis) 06/23/2013   Anxiety    Malignant neoplasm of upper-outer quadrant of left breast in female, 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   Allergy 06/06/2011   COLONIC POLYPS 01/25/2010   DIVERTICULOSIS OF COLON 01/25/2010   DEGENERATIVE JOINT DISEASE 11/06/2008   PANIC DISORDER 07/23/2008   Sarcoidosis 10/13/2007   Vitamin D  deficiency 10/13/2007   ANEMIA 10/13/2007   BRONCHITIS 10/13/2007   Dyslipidemia 04/23/2007   Adjustment disorder with mixed anxiety and depressed mood 04/23/2007   Venous (peripheral) insufficiency 04/23/2007   HEADACHE 04/23/2007    PCP: Garald Scrape MD   REFERRING PROVIDER: Jule Ronal CROME, PA-C  REFERRING DIAG: Diagnosis [phone] (ICD-10-CM) - Status post total right knee replacement  THERAPY DIAG:  Stiffness of right knee, not elsewhere classified  Difficulty in walking, not elsewhere classified  Muscle weakness (generalized)  Localized edema  Acute pain of right knee  Rationale for Evaluation and Treatment: Rehabilitation  ONSET DATE: Surgery 07/02/23  SUBJECTIVE:   SUBJECTIVE STATEMENT: Doing well; her husband is in the hospital right now so she's walking more; had so spend the night at the hospital yesterday as well.   PERTINENT HISTORY: See above   PAIN:  (2  hydrocodone  before PT) NPRS scale: at rest 0-4/10 standing & walking 1-4/10 and trying bend  0-6/10  Pain location: Right knee Pain description: Tightness Aggravating factors: Flexion Relieving factors: ice, elevation, not bending knee   PRECAUTIONS: Fall  WEIGHT BEARING RESTRICTIONS: No  FALLS:  Has patient fallen in last 6  months? No, no close calls, no FOF   LIVING ENVIRONMENT: Lives with: lives with their spouse Lives in: House/apartment Stairs: 5 STE, has steep flight of steps inside home but stair lift available for use  Has following equipment at home: Vannie - 2 wheeled, Shower bench, and stair lift, cane   OCCUPATION: retiredHydrographic surveyor at [employer]   PATIENT GOALS: be able to move without pain, get more mobile     OBJECTIVE:  Note: Objective measures were completed at Evaluation unless otherwise noted.   PATIENT SURVEYS:   Patient-Specific Activity Scoring Scheme  0 represents "unable to perform." 10 represents "able to perform at prior level. 0  1 2 3 4 5 6 7 8 9 10  (Date and Score)   Activity Eval   08/16/23  1. Getting up from chairs   5    6  2. Picking something up  3  3  3. Getting comfortable in bed  1 5  Score 3 4.67   Total score = sum of the activity scores/number of activities Minimum detectable change (90%CI) for average score = 2 points Minimum detectable change (90%CI) for single activity score = 3 points  COGNITION: Overall cognitive status: Within functional limits for tasks assessed     SENSATION: Not tested  EDEMA:  Appropriate for post-op condition   LOWER EXTREMITY ROM:  Active ROM Right eval Left eval Rt 07/25/23 Rt 07/31/23 Rt 08/14/23 Right 08/16/23 Right 08/28/23 Right 09/04/23  Knee flexion Supine heel slide 52*, HS curl in sitting 72*  Supine AA: 65 P: 78 Supine AA: 80  Supine A: 85 P: 90 Active 90 Seated P: 96* A: 92* A: 90 P: 103  Knee extension 9* supine with heel prop   A: -10 P: -8 AA: -5 A: -7 P: -5 Active -2     (Blank rows = not tested)  LOWER EXTREMITY MMT:  MMT Right eval Left eval Right 08/08/2023  Knee flexion 3- 4+ 4/5  Knee extension 4- 4+ 4+/5   (Blank rows = not tested)    FUNCTIONAL TESTS:  08/30/2023:  TUG without device 19.5 sec  Eval:  Timed up and go (TUG): 29.7 seconds RW 2 3 minute walk test: 26ft, RW    GAIT: Eval:  Distance walked: 267ft Assistive device utilized: Environmental consultant - 2 wheeled Level of assistance: Modified independence Comments: limited heel toe pattern, antalgic gait, tends to lean heavily on RW                    TODAY'S  TREATMENT 09/04/23 TherEx NuStep L7 x 8 min; seat 8 working on flexion Rt hamstring stretch with overpressure at distal thigh for extension 3x30 sec; heel on 8 step Incline board calf stretch 3x30 sec  Calf raises on incline board 2x10 bil Rt foot on 8 step with flexion holds 10 x 10 sec  AA heel slides 2x10  TherAct Leg Press 2x15 100# bil; single limb performed bil 43# 2x15  Modalities Vaso x 10 min; med pressure 34 deg in elevation  08/30/2023   Therapeutic Exercise: Recumbent bike Seat 7 rocking flexion stretch for 2.5 min, full revolutions backwards 2.5 min, full revolutions forward 3 min level 1 for 5 min.   Standing knee flexion stretch with foot on second step 10-second hold 3 reps 3 sets Standing Gastroc stretch on scapular depression 30 sec hold 3 reps Standing hamstring stretch with foot on second step 30 sec hold 3 reps Supine hook lying hip flexor stretch with leg over edge of bed 30 sec hold 3 reps Supine hook lying quad stretch with leg over edge strap knee flexion 30 sec hold 3 reps PT updated HEP with handout, verbal cues for patient on vaso machine.  Patient verbalized understanding.  Therapeutic Activities: See objective for TUG Sit to / from stand - PT demo & verbal cues on technique.  Pt performed from 18 chair 5 reps with armrests  PT demo and verbal cues on negotiating stairs utilizing RLE: Descending with step to pattern 5 steps 2 rails, 3 steps left rail  /RUE cane, 3 steps & 4 steps right rail /LUE cane all with supervision and  PT cues.  Ascending 2 rails alternating pattern for 11 steps and left rail /RUE cane for steps step to pattern.  Patient verbalizes general understanding of technique to utilize right leg to  negotiate stairs.  Vaso Right Knee 10 minutes medium compression 34* with elevation    08/28/2023   Therapeutic Exercise: Recumbent bike Seat 7 rocking flexion stretch for 3 min, full revolutions backwards 3 min (initial 3 reps with PT CGA), full revolutions forward 3 min (PT CGA first 5 revolutions) Seated LAQ and active knee flexion with contralateral LE opposing motion 15 reps. Supine gravity assisted (holding femur vertical) active knee flexion 10 reps.   Therapeutic Activities: Leg Press BLEs 93# 15 reps with PT cues on technique;  RLE only 43# 15 reps  PT gave pt info for [employer] to guide for optimal shoes.  Pt verbalized understanding.    Manual Therapy: PROM with overpressure and contract relax.    Self-care: PT demo and verbal cues on percussion gun and rolling pin to quad.   Patient verbalized and return demonstration understanding.  Vaso Right Knee 10 minutes medium compression 34* with elevation   PATIENT EDUCATION:  Education details: exam findings, POC, HEP  Person educated: Patient Education method: Explanation, Demonstration, and Handouts Education comprehension: verbalized understanding, returned demonstration, and needs further education  HOME EXERCISE PROGRAM: Access Code: [email or web address] URL: [email or web address] Date: 08/30/2023 Prepared by: Grayce Spatz  Exercises - Standing Heel Raise with Support  - 1 x daily - 7 x weekly - 2 sets - 10 reps - Hip Abduction with Resistance Loop  - 1 x daily - 7 x weekly - 2 sets - 10 reps - Hip Extension with Resistance Loop  - 1 x daily - 7 x weekly - 2 sets - 10 reps - Sit to Stand with Arm Reach Toward Target  - 1 x daily - 7 x weekly - 1 sets - 10 reps - Seated Knee Extension with Resistance  - 1 x daily - 7 x weekly - 2 sets - 10 reps - Seated Hamstring Curls with Resistance  - 1 x daily - 7 x weekly - 2 sets - 10 reps - Seated Knee Flexion Stretch  - 2-3 x daily - 7 x weekly - 2-3 sets - 20-30 sec  hold - Seated Quad Set  - 2-3 x daily - 7 x weekly - 2-3 sets - 10 reps - 3 sec hold - Seated Knee Flexion AAROM  - 3 x daily - 7 x weekly - 1 sets - 10 reps - 10 seconds hold - Supine Quadricep Sets  - 3 x daily - 7 x weekly - 2 sets - 10 reps - 5 second hold - Standing Knee Flexion Stretch on Step  - 1-2 x daily - 7 x weekly - 3 sets - 3 reps - 10 seconds hold - Standing Hamstring Stretch with Step (Mirrored)  - 1-2 x daily - 7 x weekly - 1 sets - 3 reps - 30 seconds hold - Modified Thomas Stretch  - 1-2 x daily - 7 x weekly - 1 sets - 3 reps - 30 seconds hold - Supine Quadriceps Stretch with Strap on Table (Mirrored)  - 1-2 x daily - 7 x weekly - 1 sets - 3 reps - 30 seconds hold - Gastroc Stretch on Step  - 1-2 x daily - 7 x weekly - 1 sets - 3 reps - 30 seconds hold  ASSESSMENT:  CLINICAL IMPRESSION: Pt with improved PROM  today and overall progressing well with PT.  Will continue to benefit from PT to maximize function.  OBJECTIVE IMPAIRMENTS: Abnormal gait, decreased activity tolerance, decreased balance, decreased knowledge of use of DME, decreased mobility, difficulty walking, decreased ROM, decreased strength, hypomobility, increased fascial restrictions, impaired flexibility, and pain.    GOALS: Goals reviewed with patient? No  SHORT TERM GOALS: Target date: 08/17/2023    Will be compliant with appropriate progressive HEP Goal status: MET 07/25/23   2. Rt knee AROM flexion to be at least and extension AROM to be at least 95* Goal status: Met 08/28/2023    3. Will be independent with edema management strategies  Goal status: MET  08/14/2023 (compression stockings, ice and elevation)   4. Gait pattern to have normalized with LRAD  Goal status: MET 08/14/23 c straight cane   5. Will complete TUG test in 15 seconds or less with LRAD to show improved functional balance  Goal status: Improved by 10 seconds but not to target   08/30/2023     LONG TERM GOALS: Target date:  09/14/2023    MMT to have improved by one grade all weak groups Goal status: on going  08/28/2023    2. Knee AROM to be WNL and pain free Goal status: on going 08/28/2023   3. Will be able to ascend and descend stairs reciprocally with no increase in pain Goal status: on going  08/28/2023    4. Will be able to ambulate community distances and perform all household tasks with no increase in pain  Goal status: on going 08/28/2023    5. PSFS to have improved by at least 3 points to show improved QOL and subjective improvement  Goal status: on going   08/28/2023    PLAN:  PT FREQUENCY: 2x/week  PT DURATION: 8 weeks  PLANNED INTERVENTIONS: 97750- Physical Performance Testing, 97110-Therapeutic exercises, 97530- Therapeutic activity, 97112- Neuromuscular re-education, 97535- Self Care, [id number]- Manual therapy, [id number]- Gait training, 97016- Vasopneumatic device, Balance training, Stair training, Taping, Dry Needling, and DME instructions  PLAN FOR NEXT SESSION:  Check on updated HEP and stairs at home; progression of quadriceps strengthening for Rt leg (leg press again, etc.) while addressing flexion AROM.  Balance and functional progressions as appropriate.  NEXT MD VISIT: 09/12/23 Dr. Jerri Corean JULIANNA Alvia, PT, DPT 09/04/23 10:18 AM

## 2023-09-06 ENCOUNTER — Encounter: Payer: Self-pay | Admitting: Rehabilitative and Restorative Service Providers"

## 2023-09-06 ENCOUNTER — Ambulatory Visit: Admitting: Rehabilitative and Restorative Service Providers"

## 2023-09-06 DIAGNOSIS — M25561 Pain in right knee: Secondary | ICD-10-CM

## 2023-09-06 DIAGNOSIS — M6281 Muscle weakness (generalized): Secondary | ICD-10-CM | POA: Diagnosis not present

## 2023-09-06 DIAGNOSIS — R600 Localized edema: Secondary | ICD-10-CM

## 2023-09-06 DIAGNOSIS — R262 Difficulty in walking, not elsewhere classified: Secondary | ICD-10-CM

## 2023-09-06 DIAGNOSIS — M25661 Stiffness of right knee, not elsewhere classified: Secondary | ICD-10-CM | POA: Diagnosis not present

## 2023-09-06 NOTE — Therapy (Signed)
 OUTPATIENT PHYSICAL THERAPY LOWER EXTREMITY TREATMENT   Patient Name: Marilyn [name] MRN: [id number] DOB:[birth year], 68 y.o., female Today's Date: 09/06/2023  END OF SESSION:  PT End of Session - 09/06/23 1059     Visit Number 15    Number of Visits 17    Date for PT Re-Evaluation 09/14/23    Authorization Type [employer]    Authorization Time Period 07/20/23 to 09/14/23    Progress Note Due on Visit 60    PT Start Time 1051    PT Stop Time 0154    PT Time Calculation (min) 903 min    Activity Tolerance Patient tolerated treatment well;No increased pain    Behavior During Therapy [facility] for tasks assessed/performed                 Past Medical History:  Diagnosis Date   Allergy    Anemia    hx   Anxiety    panic disorder   Breast cancer (HCC) 06/06/2011   bc  left breast 3 o'clock dx=invasive ductal ca uoqER/PR=positive   Cancer (HCC)    BREAST - left   Cigarette smoker    Colon polyp    Complication of anesthesia    DIFFICULTY AWAKENING, BP INCREASE AFTER CHOLECYSTECTEMY    COPD (chronic obstructive pulmonary disease) (HCC)    Depression    Diverticulosis of colon    DJD (degenerative joint disease)    Family history of breast cancer    Family history of colon cancer    Family history of pancreatic cancer    Family history of prostate cancer    Gallstones    GERD (gastroesophageal reflux disease)    Pt. denies having GERD. Unable to remove it.   History of anemia    History of bronchitis    History of sarcoidosis    Hyperlipidemia    no meds needed   Hypertension    Incisional breast wound    AT AGE 41 BILATERAL INCISION TO BREAST MADE   Joint pain    Leg swelling    Panic disorder    Personal history of radiation therapy    Pre-diabetes    S/P radiation therapy 08/17/11 - 09/28/11   LLQ - 50 Gy/25 Fractions with Boost of 10 Gy / 5 fractions   Sialoadenitis of submandibular gland 2016   Vitamin D  deficiency    Past Surgical History:   Procedure Laterality Date   BIOPSY BREAST     left breast  as a teenager  benign   BREAST BIOPSY Left 06/06/2011   BREAST EXCISIONAL BIOPSY Bilateral    BREAST LUMPECTOMY Left 2013   BREAST SURGERY  07/05/11   left breast lumpectomy with needle loc & axillary sln bx   BUNIONECTOMY  2011   bilateral by Dr Lea   CHOLECYSTECTOMY  02/2019   COLONOSCOPY     polyp   cyst removed from right hand  1995   POLYPECTOMY     TONSILLECTOMY  1972   TOTAL ABDOMINAL HYSTERECTOMY  2000   Dr Rosalynn   TOTAL KNEE ARTHROPLASTY Right 07/02/2023   Procedure: ARTHROPLASTY, KNEE, TOTAL;  Surgeon: Jerri Kay HERO, MD;  Location: [facility] OR;  Service: Orthopedics;  Laterality: Right;   TRIGGER FINGER RELEASE  2008   Dr Sissy   Patient Active Problem List   Diagnosis Date Noted   Prediabetes- new onset 08/27/2023   Mixed hyperlipidemia 08/27/2023   Iron deficiency 08/27/2023   Status post total right knee replacement  07/02/2023   Primary osteoarthritis of right knee 07/01/2023   Hypercholesterolemia 05/10/2022   Trochanteric bursitis of right hip 05/10/2022   Preop exam for internal medicine 02/09/2022   Plantar flexed metatarsal bone of right foot 01/11/2022   Grief 09/28/2021   Leg swelling 04/18/2021   Incisional breast wound 04/18/2021   History of sarcoidosis 04/18/2021   History of bronchitis 04/18/2021   GERD (gastroesophageal reflux disease) 04/18/2021   Family history of prostate cancer 04/18/2021   Depression 04/18/2021   Complication of anesthesia 04/18/2021   Colon polyp 04/18/2021   Cigarette smoker 04/18/2021   Cancer (HCC) 04/18/2021   Coronary atherosclerosis 01/30/2021   Aortic atherosclerosis (HCC) 01/30/2021   Primary hypertension 01/14/2021   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   Knee pain, chronic 08/28/2019   Elevated BP without diagnosis of hypertension 08/28/2019   Callus of foot 08/28/2019   Cholelithiasis 12/26/2018   Genetic testing 12/26/2017   Family history of breast  cancer    Family history of pancreatic cancer    Family history of colon cancer    Abdominal pain 07/28/2016   Morbid obesity (HCC) 03/13/2016   Dyspnea 11/09/2015   Rash and nonspecific skin eruption 07/27/2015   Well adult exam 07/23/2014   Sialoadenitis 06/02/2014   Edema 07/07/2013   AB (asthmatic bronchitis) 06/23/2013   Anxiety    Malignant neoplasm of upper-outer quadrant of left breast in female, 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   Allergy 06/06/2011   COLONIC POLYPS 01/25/2010   DIVERTICULOSIS OF COLON 01/25/2010   DEGENERATIVE JOINT DISEASE 11/06/2008   PANIC DISORDER 07/23/2008   Sarcoidosis 10/13/2007   Vitamin D  deficiency 10/13/2007   ANEMIA 10/13/2007   BRONCHITIS 10/13/2007   Dyslipidemia 04/23/2007   Adjustment disorder with mixed anxiety and depressed mood 04/23/2007   Venous (peripheral) insufficiency 04/23/2007   HEADACHE 04/23/2007    PCP: Garald Scrape MD   REFERRING PROVIDER: Jule Ronal CROME, PA-C  REFERRING DIAG: Diagnosis [phone] (ICD-10-CM) - Status post total right knee replacement  THERAPY DIAG:  Stiffness of right knee, not elsewhere classified  Difficulty in walking, not elsewhere classified  Muscle weakness (generalized)  Localized edema  Acute pain of right knee  Rationale for Evaluation and Treatment: Rehabilitation  ONSET DATE: Surgery 07/02/23  SUBJECTIVE:   SUBJECTIVE STATEMENT: Pt indicated feeling tight today.  Reported husband was in hospital recently and she has been running around.  Pt indicated    PERTINENT HISTORY: See above   PAIN:   NPRS scale: in last 24 hours: 4/10.  Pain location: Right knee Pain description: Tightness Aggravating factors: Flexion Relieving factors: ice, elevation, not bending knee   PRECAUTIONS: Fall  WEIGHT BEARING RESTRICTIONS: No  FALLS:  Has patient fallen in last 6 months? No, no close calls, no FOF   LIVING ENVIRONMENT: Lives with: lives with their  spouse Lives in: House/apartment Stairs: 5 STE, has steep flight of steps inside home but stair lift available for use  Has following equipment at home: Vannie - 2 wheeled, Shower bench, and stair lift, cane   OCCUPATION: retiredHydrographic surveyor at [employer]   PATIENT GOALS: be able to move without pain, get more mobile     OBJECTIVE:  Note: Objective measures were completed at Evaluation unless otherwise noted.   PATIENT SURVEYS:   Patient-Specific Activity Scoring Scheme  0 represents "unable to perform." 10 represents "able to perform at prior level. 0 1 2 3 4 5 6 7 8 9  10 (Date and Score)   Activity  Eval   08/16/23  1. Getting up from chairs   5    6  2. Picking something up  3  3  3. Getting comfortable in bed  1 5  Score 3 4.67   Total score = sum of the activity scores/number of activities Minimum detectable change (90%CI) for average score = 2 points Minimum detectable change (90%CI) for single activity score = 3 points  COGNITION: Overall cognitive status: Within functional limits for tasks assessed     SENSATION: Not tested  EDEMA:  Appropriate for post-op condition   LOWER EXTREMITY ROM:  Active ROM Right eval Left eval Rt 07/25/23 Rt 07/31/23 Rt 08/14/23 Right 08/16/23 Right 08/28/23 Right 09/04/23 Right 09/06/2023  Knee flexion Supine heel slide 52*, HS curl in sitting 72*  Supine AA: 65 P: 78 Supine AA: 80  Supine A: 85 P: 90 Active 90 Seated P: 96* A: 92* A: 90 P: 103 AROM in supine heel slide 92  Knee extension 9* supine with heel prop   A: -10 P: -8 AA: -5 A: -7 P: -5 Active -2      (Blank rows = not tested)  LOWER EXTREMITY MMT:  MMT Right eval Left eval Right 08/08/2023  Knee flexion 3- 4+ 4/5  Knee extension 4- 4+ 4+/5   (Blank rows = not tested)    FUNCTIONAL TESTS:  08/30/2023:  TUG without device 19.5 sec  Eval:  Timed up and go (TUG): 29.7 seconds RW 2 3 minute walk test: 266ft, RW   GAIT: 09/06/2023: SPC ambulation into and  during clinic visit.   Eval:  Distance walked: 272ft Assistive device utilized: Environmental consultant - 2 wheeled Level of assistance: Modified independence Comments: limited heel toe pattern, antalgic gait, tends to lean heavily on RW                    TODAY'S  TREATMENT      DATE: 09/06/2023 Therex: Nustep lvl 6 10 mins UE/LE Incline gastroc 30 sec x 3 Standing bilateral PF on incline board 3 x 10 between stretch time Seated Rt leg LAQ with end range hold 2 secs each way 4 lbs 2 x 15 for ROM - opposite leg movement opposite (cues for home use to help tightness complaints throughout the day  TherActivity Leg press double leg in available knee flexion range 2 x 15 100 lbs, single leg 43 lbs  x 15 bilaterally  Step on over and down WB on Rt 4 inch step x 10 with light hand assist on bar (Rt hand to mimic at home)  Neuro Re-ed Tandem stance 30 seconds x 1 bilateral on floor EO with very minimal HHA on bars, 30 sec x 1 bilateral EC with SBA, occasional HHA on bars SLS with contralateral leg tapping black mat corners x 6 each, performed bilaterally with occasional HHA on bars    Vaso: 10 mins medium compression 34 deg in elevation   TODAY'S  TREATMENT      DATE:  09/04/23 TherEx NuStep L7 x 8 min; seat 8 working on flexion Rt hamstring stretch with overpressure at distal thigh for extension 3x30 sec; heel on 8 step Incline board calf stretch 3x30 sec  Calf raises on incline board 2x10 bil Rt foot on 8 step with flexion holds 10 x 10 sec  AA heel slides 2x10  TherAct Leg Press 2x15 100# bil; single limb performed bil 43# 2x15  Modalities Vaso x 10 min; med pressure 34 deg in  elevation  TODAY'S  TREATMENT      DATE: 08/30/2023   Therapeutic Exercise: Recumbent bike Seat 7 rocking flexion stretch for 2.5 min, full revolutions backwards 2.5 min, full revolutions forward 3 min level 1 for 5 min.   Standing knee flexion stretch with foot on second step 10-second hold 3 reps 3 sets Standing  Gastroc stretch on scapular depression 30 sec hold 3 reps Standing hamstring stretch with foot on second step 30 sec hold 3 reps Supine hook lying hip flexor stretch with leg over edge of bed 30 sec hold 3 reps Supine hook lying quad stretch with leg over edge strap knee flexion 30 sec hold 3 reps PT updated HEP with handout, verbal cues for patient on vaso machine.  Patient verbalized understanding.  Therapeutic Activities: See objective for TUG Sit to / from stand - PT demo & verbal cues on technique.  Pt performed from 18 chair 5 reps with armrests  PT demo and verbal cues on negotiating stairs utilizing RLE: Descending with step to pattern 5 steps 2 rails, 3 steps left rail  /RUE cane, 3 steps & 4 steps right rail /LUE cane all with supervision and PT cues.  Ascending 2 rails alternating pattern for 11 steps and left rail /RUE cane for steps step to pattern.  Patient verbalizes general understanding of technique to utilize right leg to negotiate stairs.  Vaso Right Knee 10 minutes medium compression 34* with elevation  PATIENT EDUCATION:  Education details: exam findings, POC, HEP  Person educated: Patient Education method: Explanation, Demonstration, and Handouts Education comprehension: verbalized understanding, returned demonstration, and needs further education  HOME EXERCISE PROGRAM: Access Code: [email or web address] URL: [email or web address] Date: 08/30/2023 Prepared by: Grayce Spatz  Exercises - Standing Heel Raise with Support  - 1 x daily - 7 x weekly - 2 sets - 10 reps - Hip Abduction with Resistance Loop  - 1 x daily - 7 x weekly - 2 sets - 10 reps - Hip Extension with Resistance Loop  - 1 x daily - 7 x weekly - 2 sets - 10 reps - Sit to Stand with Arm Reach Toward Target  - 1 x daily - 7 x weekly - 1 sets - 10 reps - Seated Knee Extension with Resistance  - 1 x daily - 7 x weekly - 2 sets - 10 reps - Seated Hamstring Curls with Resistance  - 1 x daily - 7 x weekly  - 2 sets - 10 reps - Seated Knee Flexion Stretch  - 2-3 x daily - 7 x weekly - 2-3 sets - 20-30 sec hold - Seated Quad Set  - 2-3 x daily - 7 x weekly - 2-3 sets - 10 reps - 3 sec hold - Seated Knee Flexion AAROM  - 3 x daily - 7 x weekly - 1 sets - 10 reps - 10 seconds hold - Supine Quadricep Sets  - 3 x daily - 7 x weekly - 2 sets - 10 reps - 5 second hold - Standing Knee Flexion Stretch on Step  - 1-2 x daily - 7 x weekly - 3 sets - 3 reps - 10 seconds hold - Standing Hamstring Stretch with Step (Mirrored)  - 1-2 x daily - 7 x weekly - 1 sets - 3 reps - 30 seconds hold - Modified Thomas Stretch  - 1-2 x daily - 7 x weekly - 1 sets - 3 reps - 30 seconds hold - Supine Quadriceps Stretch with Strap on Table (  Mirrored)  - 1-2 x daily - 7 x weekly - 1 sets - 3 reps - 30 seconds hold - Gastroc Stretch on Step  - 1-2 x daily - 7 x weekly - 1 sets - 3 reps - 30 seconds hold  ASSESSMENT:  CLINICAL IMPRESSION: Continued to work on progression in [employer] strengthening activity for stairs, ambulation improvements.  Fair control on static balance challenges.  Continued skilled PT services warranted at this time to continue progression towards goals.    OBJECTIVE IMPAIRMENTS: Abnormal gait, decreased activity tolerance, decreased balance, decreased knowledge of use of DME, decreased mobility, difficulty walking, decreased ROM, decreased strength, hypomobility, increased fascial restrictions, impaired flexibility, and pain.    GOALS: Goals reviewed with patient? No  SHORT TERM GOALS: Target date: 08/17/2023    Will be compliant with appropriate progressive HEP Goal status: MET 07/25/23   2. Rt knee AROM flexion to be at least and extension AROM to be at least 95* Goal status: Met 08/28/2023    3. Will be independent with edema management strategies  Goal status: MET  08/14/2023 (compression stockings, ice and elevation)   4. Gait pattern to have normalized with LRAD  Goal status: MET 08/14/23 c  straight cane   5. Will complete TUG test in 15 seconds or less with LRAD to show improved functional balance  Goal status: Improved by 10 seconds but not to target   08/30/2023     LONG TERM GOALS: Target date: 09/14/2023    MMT to have improved by one grade all weak groups Goal status: on going  08/28/2023    2. Knee AROM to be WNL and pain free Goal status: on going 08/28/2023   3. Will be able to ascend and descend stairs reciprocally with no increase in pain Goal status: on going  08/28/2023    4. Will be able to ambulate community distances and perform all household tasks with no increase in pain  Goal status: on going 08/28/2023    5. PSFS to have improved by at least 3 points to show improved QOL and subjective improvement  Goal status: on going   08/28/2023    PLAN:  PT FREQUENCY: 2x/week  PT DURATION: 8 weeks  PLANNED INTERVENTIONS: 97750- Physical Performance Testing, 97110-Therapeutic exercises, 97530- Therapeutic activity, 97112- Neuromuscular re-education, 97535- Self Care, [id number]- Manual therapy, 97116- Gait training, 97016- Vasopneumatic device, Balance training, Stair training, Taping, Dry Needling, and DME instructions  PLAN FOR NEXT SESSION:  MD note. Possible recert if further visits indicated.  NEXT MD VISIT: 09/12/23 Dr. Jerri Ozell Silvan, PT, DPT, OCS, ATC 09/06/23  11:43 AM

## 2023-09-07 ENCOUNTER — Encounter: Admitting: Physical Therapy

## 2023-09-10 ENCOUNTER — Encounter: Admitting: Physical Therapy

## 2023-09-10 NOTE — Therapy (Incomplete)
 OUTPATIENT PHYSICAL THERAPY LOWER EXTREMITY TREATMENT   Patient Name: Marilyn [name] MRN: [id number] DOB:January 11, 1956, 68 y.o., female Today's Date: 09/10/2023  END OF SESSION:           Past Medical History:  Diagnosis Date   Allergy    Anemia    hx   Anxiety    panic disorder   Breast cancer (HCC) 06/06/2011   bc  left breast 3 o'clock dx=invasive ductal ca uoqER/PR=positive   Cancer (HCC)    BREAST - left   Cigarette smoker    Colon polyp    Complication of anesthesia    DIFFICULTY AWAKENING, BP INCREASE AFTER CHOLECYSTECTEMY    COPD (chronic obstructive pulmonary disease) (HCC)    Depression    Diverticulosis of colon    DJD (degenerative joint disease)    Family history of breast cancer    Family history of colon cancer    Family history of pancreatic cancer    Family history of prostate cancer    Gallstones    GERD (gastroesophageal reflux disease)    Pt. denies having GERD. Unable to remove it.   History of anemia    History of bronchitis    History of sarcoidosis    Hyperlipidemia    no meds needed   Hypertension    Incisional breast wound    AT AGE 18 BILATERAL INCISION TO BREAST MADE   Joint pain    Leg swelling    Panic disorder    Personal history of radiation therapy    Pre-diabetes    S/P radiation therapy 08/17/11 - 09/28/11   LLQ - 50 Gy/25 Fractions with Boost of 10 Gy / 5 fractions   Sialoadenitis of submandibular gland 2016   Vitamin D  deficiency    Past Surgical History:  Procedure Laterality Date   BIOPSY BREAST     left breast  as a teenager  benign   BREAST BIOPSY Left 06/06/2011   BREAST EXCISIONAL BIOPSY Bilateral    BREAST LUMPECTOMY Left 2013   BREAST SURGERY  07/05/11   left breast lumpectomy with needle loc & axillary sln bx   BUNIONECTOMY  2011   bilateral by Dr Lea   CHOLECYSTECTOMY  02/2019   COLONOSCOPY     polyp   cyst removed from right hand  1995   POLYPECTOMY     TONSILLECTOMY  1972   TOTAL  ABDOMINAL HYSTERECTOMY  2000   Dr Rosalynn   TOTAL KNEE ARTHROPLASTY Right 07/02/2023   Procedure: ARTHROPLASTY, KNEE, TOTAL;  Surgeon: Jerri Kay HERO, MD;  Location: [facility] OR;  Service: Orthopedics;  Laterality: Right;   TRIGGER FINGER RELEASE  2008   Dr Sissy   Patient Active Problem List   Diagnosis Date Noted   Prediabetes- new onset 08/27/2023   Mixed hyperlipidemia 08/27/2023   Iron deficiency 08/27/2023   Status post total right knee replacement 07/02/2023   Primary osteoarthritis of right knee 07/01/2023   Hypercholesterolemia 05/10/2022   Trochanteric bursitis of right hip 05/10/2022   Preop exam for internal medicine 02/09/2022   Plantar flexed metatarsal bone of right foot 01/11/2022   Grief 09/28/2021   Leg swelling 04/18/2021   Incisional breast wound 04/18/2021   History of sarcoidosis 04/18/2021   History of bronchitis 04/18/2021   GERD (gastroesophageal reflux disease) 04/18/2021   Family history of prostate cancer 04/18/2021   Depression 04/18/2021   Complication of anesthesia 04/18/2021   Colon polyp 04/18/2021   Cigarette smoker 04/18/2021   Cancer (HCC)  04/18/2021   Coronary atherosclerosis 01/30/2021   Aortic atherosclerosis (HCC) 01/30/2021   Primary hypertension 01/14/2021   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   Knee pain, chronic 08/28/2019   Elevated BP without diagnosis of hypertension 08/28/2019   Callus of foot 08/28/2019   Cholelithiasis 12/26/2018   Genetic testing 12/26/2017   Family history of breast cancer    Family history of pancreatic cancer    Family history of colon cancer    Abdominal pain 07/28/2016   Morbid obesity (HCC) 03/13/2016   Dyspnea 11/09/2015   Rash and nonspecific skin eruption 07/27/2015   Well adult exam 07/23/2014   Sialoadenitis 06/02/2014   Edema 07/07/2013   AB (asthmatic bronchitis) 06/23/2013   Anxiety    Malignant neoplasm of upper-outer quadrant of left breast in female, 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    Allergy 06/06/2011   COLONIC POLYPS 01/25/2010   DIVERTICULOSIS OF COLON 01/25/2010   DEGENERATIVE JOINT DISEASE 11/06/2008   PANIC DISORDER 07/23/2008   Sarcoidosis 10/13/2007   Vitamin D  deficiency 10/13/2007   ANEMIA 10/13/2007   BRONCHITIS 10/13/2007   Dyslipidemia 04/23/2007   Adjustment disorder with mixed anxiety and depressed mood 04/23/2007   Venous (peripheral) insufficiency 04/23/2007   HEADACHE 04/23/2007    PCP: Garald Scrape MD   REFERRING PROVIDER: Jule Ronal CROME, PA-C  REFERRING DIAG: Diagnosis [phone] (ICD-10-CM) - Status post total right knee replacement  THERAPY DIAG:  No diagnosis found.  Rationale for Evaluation and Treatment: Rehabilitation  ONSET DATE: Surgery 07/02/23  SUBJECTIVE:   SUBJECTIVE STATEMENT: ***   Pt indicated feeling tight today.  Reported husband was in hospital recently and she has been running around.    PERTINENT HISTORY: See above   PAIN:  NPRS scale: in last 24 hours: ***  4/10.  Pain location: Right knee Pain description: Tightness Aggravating factors: Flexion Relieving factors: ice, elevation, not bending knee   PRECAUTIONS: Fall  WEIGHT BEARING RESTRICTIONS: No  FALLS:  Has patient fallen in last 6 months? No, no close calls, no FOF   LIVING ENVIRONMENT: Lives with: lives with their spouse Lives in: House/apartment Stairs: 5 STE, has steep flight of steps inside home but stair lift available for use  Has following equipment at home: Vannie - 2 wheeled, Shower bench, and stair lift, cane   OCCUPATION: retiredHydrographic surveyor at [employer]   PATIENT GOALS: be able to move without pain, get more mobile    OBJECTIVE:  Note: Objective measures were completed at Evaluation unless otherwise noted.   PATIENT SURVEYS:   Patient-Specific Activity Scoring Scheme  0 represents "unable to perform." 10 represents "able to perform at prior level. 0 1 2 3 4 5 6 7 8 9  10 (Date and Score)   Activity Eval    08/16/23  1. Getting up from chairs   5    6  2. Picking something up  3  3  3. Getting comfortable in bed  1 5  Score 3 4.67   Total score = sum of the activity scores/number of activities Minimum detectable change (90%CI) for average score = 2 points Minimum detectable change (90%CI) for single activity score = 3 points  COGNITION: Overall cognitive status: Within functional limits for tasks assessed     SENSATION: Not tested  EDEMA:  Appropriate for post-op condition   LOWER EXTREMITY ROM:  Active ROM Right eval Rt 07/25/23 Rt 07/31/23 Rt 08/14/23 Right 08/16/23 Right 08/28/23 Right 09/04/23 Right 09/06/23 Right 09/10/23  Knee flexion Supine heel slide  52*, HS curl in sitting 72* Supine AA: 65 P: 78 Supine AA: 80  Supine A: 85 P: 90 Active 90 Seated P: 96* A: 92* A: 90 P: 103 AROM in supine heel slide 92 ***  Knee extension 9* supine with heel prop  A: -10 P: -8 AA: -5 A: -7 P: -5 Active -2       (Blank rows = not tested)  LOWER EXTREMITY MMT:  MMT Right eval Left eval Right 08/08/2023  Knee flexion 3- 4+ 4/5  Knee extension 4- 4+ 4+/5   (Blank rows = not tested)    FUNCTIONAL TESTS:  08/30/2023:  TUG without device 19.5 sec  Eval:  Timed up and go (TUG): 29.7 seconds RW 2 3 minute walk test: 249ft, RW   GAIT: 09/06/2023: SPC ambulation into and during clinic visit.   Eval:  Distance walked: 244ft Assistive device utilized: Environmental consultant - 2 wheeled Level of assistance: Modified independence Comments: limited heel toe pattern, antalgic gait, tends to lean heavily on RW                    TREATMENT      DATE: 09/10/2023 Therapeutic Exercise:  *** Nustep lvl 6 10 mins UE/LE Incline gastroc 30 sec x 3 Standing bilateral PF on incline board 3 x 10 between stretch time Seated Rt leg LAQ with end range hold 2 secs each way 4 lbs 2 x 15 for ROM - opposite leg movement opposite (cues for home use to help tightness complaints throughout the day  TherActivity Leg  press double leg in available knee flexion range 2 x 15 100 lbs, single leg 43 lbs  x 15 bilaterally  Step on over and down WB on Rt 4 inch step x 10 with light hand assist on bar (Rt hand to mimic at home)  Neuro Re-ed Tandem stance 30 seconds x 1 bilateral on floor EO with very minimal HHA on bars, 30 sec x 1 bilateral EC with SBA, occasional HHA on bars SLS with contralateral leg tapping black mat corners x 6 each, performed bilaterally with occasional HHA on bars    Vaso: 10 mins medium compression 34 deg in elevation    TREATMENT      DATE: 09/06/2023 Therex: Nustep lvl 6 10 mins UE/LE Incline gastroc 30 sec x 3 Standing bilateral PF on incline board 3 x 10 between stretch time Seated Rt leg LAQ with end range hold 2 secs each way 4 lbs 2 x 15 for ROM - opposite leg movement opposite (cues for home use to help tightness complaints throughout the day  TherActivity Leg press double leg in available knee flexion range 2 x 15 100 lbs, single leg 43 lbs  x 15 bilaterally  Step on over and down WB on Rt 4 inch step x 10 with light hand assist on bar (Rt hand to mimic at home)  Neuro Re-ed Tandem stance 30 seconds x 1 bilateral on floor EO with very minimal HHA on bars, 30 sec x 1 bilateral EC with SBA, occasional HHA on bars SLS with contralateral leg tapping black mat corners x 6 each, performed bilaterally with occasional HHA on bars    Vaso: 10 mins medium compression 34 deg in elevation   TREATMENT      DATE:  09/04/23 TherEx NuStep L7 x 8 min; seat 8 working on flexion Rt hamstring stretch with overpressure at distal thigh for extension 3x30 sec; heel on 8 step Incline board calf  stretch 3x30 sec  Calf raises on incline board 2x10 bil Rt foot on 8 step with flexion holds 10 x 10 sec  AA heel slides 2x10  TherAct Leg Press 2x15 100# bil; single limb performed bil 43# 2x15  Modalities Vaso x 10 min; med pressure 34 deg in elevation   PATIENT EDUCATION:  Education  details: exam findings, POC, HEP  Person educated: Patient Education method: Explanation, Demonstration, and Handouts Education comprehension: verbalized understanding, returned demonstration, and needs further education  HOME EXERCISE PROGRAM: Access Code: [email or web address] URL: [email or web address] Date: 08/30/2023 Prepared by: Grayce Spatz  Exercises - Standing Heel Raise with Support  - 1 x daily - 7 x weekly - 2 sets - 10 reps - Hip Abduction with Resistance Loop  - 1 x daily - 7 x weekly - 2 sets - 10 reps - Hip Extension with Resistance Loop  - 1 x daily - 7 x weekly - 2 sets - 10 reps - Sit to Stand with Arm Reach Toward Target  - 1 x daily - 7 x weekly - 1 sets - 10 reps - Seated Knee Extension with Resistance  - 1 x daily - 7 x weekly - 2 sets - 10 reps - Seated Hamstring Curls with Resistance  - 1 x daily - 7 x weekly - 2 sets - 10 reps - Seated Knee Flexion Stretch  - 2-3 x daily - 7 x weekly - 2-3 sets - 20-30 sec hold - Seated Quad Set  - 2-3 x daily - 7 x weekly - 2-3 sets - 10 reps - 3 sec hold - Seated Knee Flexion AAROM  - 3 x daily - 7 x weekly - 1 sets - 10 reps - 10 seconds hold - Supine Quadricep Sets  - 3 x daily - 7 x weekly - 2 sets - 10 reps - 5 second hold - Standing Knee Flexion Stretch on Step  - 1-2 x daily - 7 x weekly - 3 sets - 3 reps - 10 seconds hold - Standing Hamstring Stretch with Step (Mirrored)  - 1-2 x daily - 7 x weekly - 1 sets - 3 reps - 30 seconds hold - Modified Thomas Stretch  - 1-2 x daily - 7 x weekly - 1 sets - 3 reps - 30 seconds hold - Supine Quadriceps Stretch with Strap on Table (Mirrored)  - 1-2 x daily - 7 x weekly - 1 sets - 3 reps - 30 seconds hold - Gastroc Stretch on Step  - 1-2 x daily - 7 x weekly - 1 sets - 3 reps - 30 seconds hold  ASSESSMENT:  CLINICAL IMPRESSION: ***   Continued to work on progression in [employer] strengthening activity for stairs, ambulation improvements.  Fair control on static balance challenges.   Continued skilled PT services warranted at this time to continue progression towards goals.    OBJECTIVE IMPAIRMENTS: Abnormal gait, decreased activity tolerance, decreased balance, decreased knowledge of use of DME, decreased mobility, difficulty walking, decreased ROM, decreased strength, hypomobility, increased fascial restrictions, impaired flexibility, and pain.    GOALS: Goals reviewed with patient? No  SHORT TERM GOALS: Target date: 08/17/2023    Will be compliant with appropriate progressive HEP Goal status: MET 07/25/23   2. Rt knee AROM flexion to be at least and extension AROM to be at least 95* Goal status: Met 08/28/2023    3. Will be independent with edema management strategies  Goal status: MET  08/14/2023 (compression stockings, ice and  elevation)   4. Gait pattern to have normalized with LRAD  Goal status: MET 08/14/23 c straight cane   5. Will complete TUG test in 15 seconds or less with LRAD to show improved functional balance  Goal status: Improved by 10 seconds but not to target   08/30/2023     LONG TERM GOALS: Target date: 09/14/2023    MMT to have improved by one grade all weak groups Goal status: on going  09/14/2023    2. Knee AROM to be WNL and pain free Goal status: on going 09/14/2023   3. Will be able to ascend and descend stairs reciprocally with no increase in pain Goal status: on going  09/14/2023    4. Will be able to ambulate community distances and perform all household tasks with no increase in pain  Goal status: on going 09/14/2023    5. PSFS to have improved by at least 3 points to show improved QOL and subjective improvement  Goal status: on going   09/14/2023    PLAN:  PT FREQUENCY: 2x/week  PT DURATION: 8 weeks  PLANNED INTERVENTIONS: 97750- Physical Performance Testing, 97110-Therapeutic exercises, 97530- Therapeutic activity, 97112- Neuromuscular re-education, 97535- Self Care, [id number]- Manual therapy, [id number]- Gait training, [id number]-  Vasopneumatic device, Balance training, Stair training, Taping, Dry Needling, and DME instructions  PLAN FOR NEXT SESSION:  ***   MD note. Possible recert if further visits indicated.  NEXT MD VISIT: 09/12/23 Dr. Jerri Grayce Spatz, PT, DPT 09/10/2023, 7:34 AM

## 2023-09-12 ENCOUNTER — Ambulatory Visit (INDEPENDENT_AMBULATORY_CARE_PROVIDER_SITE_OTHER): Admitting: Orthopaedic Surgery

## 2023-09-12 ENCOUNTER — Ambulatory Visit (INDEPENDENT_AMBULATORY_CARE_PROVIDER_SITE_OTHER): Admitting: Physical Therapy

## 2023-09-12 ENCOUNTER — Encounter: Payer: Self-pay | Admitting: Physical Therapy

## 2023-09-12 DIAGNOSIS — M6281 Muscle weakness (generalized): Secondary | ICD-10-CM

## 2023-09-12 DIAGNOSIS — R262 Difficulty in walking, not elsewhere classified: Secondary | ICD-10-CM

## 2023-09-12 DIAGNOSIS — R600 Localized edema: Secondary | ICD-10-CM | POA: Diagnosis not present

## 2023-09-12 DIAGNOSIS — M25561 Pain in right knee: Secondary | ICD-10-CM

## 2023-09-12 DIAGNOSIS — M25661 Stiffness of right knee, not elsewhere classified: Secondary | ICD-10-CM

## 2023-09-12 DIAGNOSIS — Z96651 Presence of right artificial knee joint: Secondary | ICD-10-CM

## 2023-09-12 NOTE — Therapy (Addendum)
 OUTPATIENT PHYSICAL THERAPY LOWER EXTREMITY TREATMENT & RECERTIFICATION / PROGRESS NOTE   Patient Name: Marilyn [name] MRN: [id number] DOB:16-Nov-1955, 68 y.o., female Today's Date: 09/12/2023 Progress Note Reporting Period 08/20/2023 to 09/12/2023  See note below for Objective Data and Assessment of Progress/Goals.   END OF SESSION:  PT End of Session - 09/12/23 1021     Visit Number 16    Number of Visits 20    Date for PT Re-Evaluation 10/11/23    Authorization Type [employer] UMR    Authorization Time Period --    Progress Note Due on Visit 60    PT Start Time 1015    PT Stop Time 1110    PT Time Calculation (min) 55 min    Activity Tolerance Patient tolerated treatment well;No increased pain    Behavior During Therapy [facility] for tasks assessed/performed            Past Medical History:  Diagnosis Date   Allergy    Anemia    hx   Anxiety    panic disorder   Breast cancer (HCC) 06/06/2011   bc  left breast 3 o'clock dx=invasive ductal ca uoqER/PR=positive   Cancer (HCC)    BREAST - left   Cigarette smoker    Colon polyp    Complication of anesthesia    DIFFICULTY AWAKENING, BP INCREASE AFTER CHOLECYSTECTEMY    COPD (chronic obstructive pulmonary disease) (HCC)    Depression    Diverticulosis of colon    DJD (degenerative joint disease)    Family history of breast cancer    Family history of colon cancer    Family history of pancreatic cancer    Family history of prostate cancer    Gallstones    GERD (gastroesophageal reflux disease)    Pt. denies having GERD. Unable to remove it.   History of anemia    History of bronchitis    History of sarcoidosis    Hyperlipidemia    no meds needed   Hypertension    Incisional breast wound    AT AGE 26 BILATERAL INCISION TO BREAST MADE   Joint pain    Leg swelling    Panic disorder    Personal history of radiation therapy    Pre-diabetes    S/P radiation therapy 08/17/11 - 09/28/11   LLQ - 50 Gy/25 Fractions with  Boost of 10 Gy / 5 fractions   Sialoadenitis of submandibular gland 2016   Vitamin D  deficiency    Past Surgical History:  Procedure Laterality Date   BIOPSY BREAST     left breast  as a teenager  benign   BREAST BIOPSY Left 06/06/2011   BREAST EXCISIONAL BIOPSY Bilateral    BREAST LUMPECTOMY Left 2013   BREAST SURGERY  07/05/11   left breast lumpectomy with needle loc & axillary sln bx   BUNIONECTOMY  2011   bilateral by Dr Lea   CHOLECYSTECTOMY  02/2019   COLONOSCOPY     polyp   cyst removed from right hand  1995   POLYPECTOMY     TONSILLECTOMY  1972   TOTAL ABDOMINAL HYSTERECTOMY  2000   Dr Rosalynn   TOTAL KNEE ARTHROPLASTY Right 07/02/2023   Procedure: ARTHROPLASTY, KNEE, TOTAL;  Surgeon: Jerri Kay HERO, MD;  Location: [facility] OR;  Service: Orthopedics;  Laterality: Right;   TRIGGER FINGER RELEASE  2008   Dr Sissy   Patient Active Problem List   Diagnosis Date Noted   Prediabetes- new onset 08/27/2023  Mixed hyperlipidemia 08/27/2023   Iron deficiency 08/27/2023   Status post total right knee replacement 07/02/2023   Primary osteoarthritis of right knee 07/01/2023   Hypercholesterolemia 05/10/2022   Trochanteric bursitis of right hip 05/10/2022   Preop exam for internal medicine 02/09/2022   Plantar flexed metatarsal bone of right foot 01/11/2022   Grief 09/28/2021   Leg swelling 04/18/2021   Incisional breast wound 04/18/2021   History of sarcoidosis 04/18/2021   History of bronchitis 04/18/2021   GERD (gastroesophageal reflux disease) 04/18/2021   Family history of prostate cancer 04/18/2021   Depression 04/18/2021   Complication of anesthesia 04/18/2021   Colon polyp 04/18/2021   Cigarette smoker 04/18/2021   Cancer (HCC) 04/18/2021   Coronary atherosclerosis 01/30/2021   Aortic atherosclerosis (HCC) 01/30/2021   Primary hypertension 01/14/2021   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   Knee pain, chronic 08/28/2019   Elevated BP without diagnosis of hypertension  08/28/2019   Callus of foot 08/28/2019   Cholelithiasis 12/26/2018   Genetic testing 12/26/2017   Family history of breast cancer    Family history of pancreatic cancer    Family history of colon cancer    Abdominal pain 07/28/2016   Morbid obesity (HCC) 03/13/2016   Dyspnea 11/09/2015   Rash and nonspecific skin eruption 07/27/2015   Well adult exam 07/23/2014   Sialoadenitis 06/02/2014   Edema 07/07/2013   AB (asthmatic bronchitis) 06/23/2013   Anxiety    Malignant neoplasm of upper-outer quadrant of left breast in female, 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   Allergy 06/06/2011   COLONIC POLYPS 01/25/2010   DIVERTICULOSIS OF COLON 01/25/2010   DEGENERATIVE JOINT DISEASE 11/06/2008   PANIC DISORDER 07/23/2008   Sarcoidosis 10/13/2007   Vitamin D  deficiency 10/13/2007   ANEMIA 10/13/2007   BRONCHITIS 10/13/2007   Dyslipidemia 04/23/2007   Adjustment disorder with mixed anxiety and depressed mood 04/23/2007   Venous (peripheral) insufficiency 04/23/2007   HEADACHE 04/23/2007    PCP: Garald Scrape MD   REFERRING PROVIDER: Jule Ronal CROME, PA-C  REFERRING DIAG: Diagnosis [phone] (ICD-10-CM) - Status post total right knee replacement  THERAPY DIAG:  Stiffness of right knee, not elsewhere classified  Difficulty in walking, not elsewhere classified  Muscle weakness (generalized)  Localized edema  Acute pain of right knee  Rationale for Evaluation and Treatment: Rehabilitation  ONSET DATE: Surgery 07/02/23  SUBJECTIVE:   SUBJECTIVE STATEMENT: Patient arrived feeling a bit sick today. Doctors appointment today went well and said ROM and strength looks good. Doctor recommended a couple more sessions of PT and finish recovering at home with things taught.   PERTINENT HISTORY: See above   PAIN:  NPRS scale: in last 24 hours:  2/10.  Pain location: Right knee Pain description: Tightness Aggravating factors: Flexion Relieving factors: ice, elevation,  not bending knee   PRECAUTIONS: Fall  WEIGHT BEARING RESTRICTIONS: No  FALLS:  Has patient fallen in last 6 months? No, no close calls, no FOF   LIVING ENVIRONMENT: Lives with: lives with their spouse Lives in: House/apartment Stairs: 5 STE, has steep flight of steps inside home but stair lift available for use  Has following equipment at home: Vannie - 2 wheeled, Shower bench, and stair lift, cane   OCCUPATION: retiredHydrographic surveyor at [employer]   PATIENT GOALS: be able to move without pain, get more mobile    OBJECTIVE:  Note: Objective measures were completed at Evaluation unless otherwise noted.   PATIENT SURVEYS:   Patient-Specific Activity Scoring Scheme  0 represents "  unable to perform." 10 represents "able to perform at prior level. 0 1 2 3 4 5 6 7 8 9  10 (Date and Score)   Activity Eval   08/16/23  1. Getting up from chairs   5    6  2. Picking something up  3  3  3. Getting comfortable in bed  1 5  Score 3 4.67   Total score = sum of the activity scores/number of activities Minimum detectable change (90%CI) for average score = 2 points Minimum detectable change (90%CI) for single activity score = 3 points  COGNITION: Overall cognitive status: Within functional limits for tasks assessed     SENSATION: Not tested  EDEMA:  Appropriate for post-op condition   LOWER EXTREMITY ROM:  Active ROM Right eval Rt 07/25/23 Rt 07/31/23 Rt 08/14/23 Right 08/16/23 Right 08/28/23 Right 09/04/23 Right 09/06/23  Knee flexion Supine heel slide 52*, HS curl in sitting 72* Supine AA: 65 P: 78 Supine AA: 80  Supine A: 85 P: 90 Active 90 Seated P: 96* A: 92* A: 90 P: 103 AROM in supine heel slide 92  Knee extension 9* supine with heel prop  A: -10 P: -8 AA: -5 A: -7 P: -5 Active -2      (Blank rows = not tested)  LOWER EXTREMITY MMT:  MMT Right eval Left eval Right 08/08/2023  Knee flexion 3- 4+ 4/5  Knee extension 4- 4+ 4+/5   (Blank rows = not  tested)    FUNCTIONAL TESTS:  08/30/2023:  TUG without device 19.5 sec  Eval:  Timed up and go (TUG): 29.7 seconds RW 2 3 minute walk test: 260ft, RW   GAIT: 09/06/2023: SPC ambulation into and during clinic visit.   Eval:  Distance walked: 224ft Assistive device utilized: Environmental consultant - 2 wheeled Level of assistance: Modified independence Comments: limited heel toe pattern, antalgic gait, tends to lean heavily on RW                    TODAY's TREATMENT      DATE: 09/12/2023 Therapeutic Activities:   SciFit bike, seat 11, level 1, for 9 minutes. PT educated patient about using bike at local gym. Educated about seat level, intensity, and duration. Patient verbalized understanding.  Knee extension machine, BL 10x 15# , BL concentric & SL eccentric 5# 20x Rt & 10x Lt. PT educated patient about using machine at local gym and how to adjust seat back, leg piece, and weight for optimal usage. Patient verbalized understanding.  Knee flexion machine, BL 10x 25#, SL 10# 20x Rt & 10x Lt. PT educated patient about using machine at local gym and how to adjust seat back, leg piece, and weight for optimal usage. Patient verbalized understanding.  BL leg press, 100#, 2x10 and SL leg press, 43#, 2x10. PT educated patient about using machine at local gym and how to adjust seat and weight for optimal usage. Patient verbalized understanding.  PT updated and verbally reviewed HEP with patient. PT recommended patient go to local gym before next session to try new exercises in HEP and come back with any questions. Provided handout of HEP and patient verbalized understanding.    TREATMENT      DATE: 09/10/2023 Therapeutic Exercise:   Nustep lvl 6 10 mins UE/LE Incline gastroc 30 sec x 3 Standing bilateral PF on incline board 3 x 10 between stretch time Seated Rt leg LAQ with end range hold 2 secs each way 4 lbs 2 x  15 for ROM - opposite leg movement opposite (cues for home use to help tightness complaints  throughout the day  TherActivity Leg press double leg in available knee flexion range 2 x 15 100 lbs, single leg 43 lbs  x 15 bilaterally  Step on over and down WB on Rt 4 inch step x 10 with light hand assist on bar (Rt hand to mimic at home)  Neuro Re-ed Tandem stance 30 seconds x 1 bilateral on floor EO with very minimal HHA on bars, 30 sec x 1 bilateral EC with SBA, occasional HHA on bars SLS with contralateral leg tapping black mat corners x 6 each, performed bilaterally with occasional HHA on bars    Vaso: 10 mins medium compression 34 deg in elevation    TREATMENT      DATE: 09/06/2023 Therex: Nustep lvl 6 10 mins UE/LE Incline gastroc 30 sec x 3 Standing bilateral PF on incline board 3 x 10 between stretch time Seated Rt leg LAQ with end range hold 2 secs each way 4 lbs 2 x 15 for ROM - opposite leg movement opposite (cues for home use to help tightness complaints throughout the day  TherActivity Leg press double leg in available knee flexion range 2 x 15 100 lbs, single leg 43 lbs  x 15 bilaterally  Step on over and down WB on Rt 4 inch step x 10 with light hand assist on bar (Rt hand to mimic at home)  Neuro Re-ed Tandem stance 30 seconds x 1 bilateral on floor EO with very minimal HHA on bars, 30 sec x 1 bilateral EC with SBA, occasional HHA on bars SLS with contralateral leg tapping black mat corners x 6 each, performed bilaterally with occasional HHA on bars    Vaso: 10 mins medium compression 34 deg in elevation   PATIENT EDUCATION:  Education details: exam findings, POC, HEP  Person educated: Patient Education method: Programmer, multimedia, Demonstration, and Handouts Education comprehension: verbalized understanding, returned demonstration, and needs further education  HOME EXERCISE PROGRAM: Access Code: [email or web address] URL: [email or web address] Date: 09/12/2023 Prepared by: Grayce Spatz   Exercises - Standing Heel Raise with Support  - 1 x daily - 7 x  weekly - 2 sets - 10 reps - Hip Abduction with Resistance Loop  - 1 x daily - 7 x weekly - 2 sets - 10 reps - Hip Extension with Resistance Loop  - 1 x daily - 7 x weekly - 2 sets - 10 reps - Sit to Stand with Arm Reach Toward Target  - 1 x daily - 7 x weekly - 1 sets - 10 reps - Seated Knee Extension with Resistance  - 1 x daily - 7 x weekly - 2 sets - 10 reps - Seated Hamstring Curls with Resistance  - 1 x daily - 7 x weekly - 2 sets - 10 reps - Seated Knee Flexion Stretch  - 1-2 x daily - 7 x weekly - 2-3 sets - 20-30 sec hold - Standing Knee Flexion Stretch on Step  - 1-2 x daily - 7 x weekly - 3 sets - 3 reps - 10 seconds hold - Standing Hamstring Stretch with Step (Mirrored)  - 1-2 x daily - 7 x weekly - 1 sets - 3 reps - 30 seconds hold - Modified Thomas Stretch  - 1-2 x daily - 7 x weekly - 1 sets - 3 reps - 30 seconds hold - Supine Quadriceps Stretch with Strap on Table (Mirrored)  - 1-2  x daily - 7 x weekly - 1 sets - 3 reps - 30 seconds hold - Gastroc Stretch on Step  - 1-2 x daily - 7 x weekly - 1 sets - 3 reps - 30 seconds hold - Tandem Stance  - 1 x daily - 4-5 x weekly - 3 sets - 2 reps - 30 seconds hold - Recumbent Bike  - 1 x daily - 3-5 x weekly - 3 sets - 1 reps - 10 minutes hold - Knee Extension with Weight Machine  - 1 x daily - 3-4 x weekly - 1-2 sets - 10-15 reps - 5 seconds hold - Eccentric Knee Extension with Weight Machine  - 1 x daily - 3-4 x weekly - 1-2 sets - 10 reps - 3-5 seconds hold - Hamstring Curl with Weight Machine  - 1 x daily - 3-4 x weekly - 1-2 sets - 10 reps - 5 seconds hold - Single Leg Hamstring Curl with Weight Machine  - 1 x daily - 3-4 x weekly - 1-2 sets - 10 reps - 5 seconds hold - Full Leg Press  - 1 x daily - 3-4 x weekly - 2 sets - 10 reps - 5 seconds hold - Single Leg Press  - 1 x daily - 3-4 x weekly - 2 sets - 10 reps - 5 seconds hold  ASSESSMENT:  CLINICAL IMPRESSION: Patient has made excellent progress with PT.  She would benefit from 1  time a week for 4 additional weeks for further instruction on ongoing HEP and gym exercises.  Patient demonstrated good quad eccentric control during flexion/extension machine exercises. Patient seems to understand how to use machines and appropriate weight for exercises. She will benefit from another PT session to answer any questions related to HEP and rehabilitating at home.   OBJECTIVE IMPAIRMENTS: Abnormal gait, decreased activity tolerance, decreased balance, decreased knowledge of use of DME, decreased mobility, difficulty walking, decreased ROM, decreased strength, hypomobility, increased fascial restrictions, impaired flexibility, and pain.    GOALS: Goals reviewed with patient? No  SHORT TERM GOALS: Target date: 08/17/2023    Will be compliant with appropriate progressive HEP Goal status: MET 07/25/23   2. Rt knee AROM flexion to be at least and extension AROM to be at least 95* Goal status: Met 08/28/2023    3. Will be independent with edema management strategies  Goal status: MET  08/14/2023 (compression stockings, ice and elevation)   4. Gait pattern to have normalized with LRAD  Goal status: MET 08/14/23 c straight cane   5. Will complete TUG test in 15 seconds or less with LRAD to show improved functional balance  Goal status: Improved by 10 seconds but not to target   08/30/2023     LONG TERM GOALS: Target date: 10/11/2023    MMT to have improved by one grade all weak groups Goal status: on going  09/12/2023    2. Knee AROM to be WNL and pain free Goal status: on going 09/12/2023   3. Will be able to ascend and descend stairs reciprocally with no increase in pain Goal status: on going  09/12/2023    4. Will be able to ambulate community distances and perform all household tasks with no increase in pain  Goal status: on going 09/12/2023    5. PSFS to have improved by at least 3 points to show improved QOL and subjective improvement  Goal status: on going    09/12/2023    PLAN:  PT  FREQUENCY: 1x/wk   PT DURATION: 4 weeks  PLANNED INTERVENTIONS: 97750- Physical Performance Testing, 97110-Therapeutic exercises, 97530- Therapeutic activity, [id number]- Neuromuscular re-education, 97535- Self Care, [id number]- Manual therapy, 97116- Gait training, [phone]- Vasopneumatic device, Balance training, Stair training, Taping, Dry Needling, and DME instructions  PLAN FOR NEXT SESSION:   Check in with how HEP went at local gym. Check in with LTGs. Introduce tandem exercises.     Ismael Nap, Student-PT, DPT 09/12/2023, 12:27 PM  This entire session of physical therapy was performed under the direct supervision of PT signing evaluation /treatment. PT reviewed note and agrees.   Grayce Spatz, PT, DPT 09/12/2023, 12:52 PM

## 2023-09-12 NOTE — Progress Notes (Signed)
 Post-Op Visit Note   Patient: Marilyn [name]           Date of Birth: 03-Apr-1955           MRN: [id number] Visit Date: 09/12/2023 PCP: Garald Karlynn GAILS, MD   Assessment & Plan:  Chief Complaint:  Chief Complaint  Patient presents with   Right Knee - Follow-up, Routine Post Op   Visit Diagnoses:  1. Status post total right knee replacement     Plan: History of Present Illness Marilyn [name] is a 68 year old female who presents for postop follow-up after right knee replacement surgery.  She is ten weeks and two days post right knee replacement surgery. She has experienced significant improvement in her ability to lay on her side without pain.  Her left knee is now experiencing symptoms similar to her pre-surgery right knee, including swelling, throbbing, and crackling. She is concerned about the severity of these symptoms but is not planning on undergoing another knee replacement soon due to financial considerations and insurance coverage issues.  Physical Exam MUSCULOSKELETAL: Right knee flexibility is progressing well, nearing the flexibility of the left knee.  Surgical scar fully healed.  Assessment and Plan Postoperative state following right knee replacement 10 weeks post-surgery with improved pain and flexibility. Right knee flexibility nearing that of the left knee. Swelling expected to continue resolving, enhancing range of motion. Overall progress satisfactory. - Advise that she can stop physical therapy and continue exercises independently if preferred.  Follow-Up Instructions: Return in about 3 months (around 12/13/2023).   Orders:  No orders of the defined types were placed in this encounter.  No orders of the defined types were placed in this encounter.   Imaging: No results found.  PMFS History: Patient Active Problem List   Diagnosis Date Noted   Prediabetes- new onset 08/27/2023   Mixed hyperlipidemia 08/27/2023   Iron deficiency  08/27/2023   Status post total right knee replacement 07/02/2023   Primary osteoarthritis of right knee 07/01/2023   Hypercholesterolemia 05/10/2022   Trochanteric bursitis of right hip 05/10/2022   Preop exam for internal medicine 02/09/2022   Plantar flexed metatarsal bone of right foot 01/11/2022   Grief 09/28/2021   Leg swelling 04/18/2021   Incisional breast wound 04/18/2021   History of sarcoidosis 04/18/2021   History of bronchitis 04/18/2021   GERD (gastroesophageal reflux disease) 04/18/2021   Family history of prostate cancer 04/18/2021   Depression 04/18/2021   Complication of anesthesia 04/18/2021   Colon polyp 04/18/2021   Cigarette smoker 04/18/2021   Cancer (HCC) 04/18/2021   Coronary atherosclerosis 01/30/2021   Aortic atherosclerosis (HCC) 01/30/2021   Primary hypertension 01/14/2021   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   Knee pain, chronic 08/28/2019   Elevated BP without diagnosis of hypertension 08/28/2019   Callus of foot 08/28/2019   Cholelithiasis 12/26/2018   Genetic testing 12/26/2017   Family history of breast cancer    Family history of pancreatic cancer    Family history of colon cancer    Abdominal pain 07/28/2016   Morbid obesity (HCC) 03/13/2016   Dyspnea 11/09/2015   Rash and nonspecific skin eruption 07/27/2015   Well adult exam 07/23/2014   Sialoadenitis 06/02/2014   Edema 07/07/2013   AB (asthmatic bronchitis) 06/23/2013   Anxiety    Malignant neoplasm of upper-outer quadrant of left breast in female, 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   Allergy 06/06/2011   COLONIC POLYPS 01/25/2010   DIVERTICULOSIS OF COLON 01/25/2010  DEGENERATIVE JOINT DISEASE 11/06/2008   PANIC DISORDER 07/23/2008   Sarcoidosis 10/13/2007   Vitamin D  deficiency 10/13/2007   ANEMIA 10/13/2007   BRONCHITIS 10/13/2007   Dyslipidemia 04/23/2007   Adjustment disorder with mixed anxiety and depressed mood 04/23/2007   Venous (peripheral) insufficiency  04/23/2007   HEADACHE 04/23/2007   Past Medical History:  Diagnosis Date   Allergy    Anemia    hx   Anxiety    panic disorder   Breast cancer (HCC) 06/06/2011   bc  left breast 3 o'clock dx=invasive ductal ca uoqER/PR=positive   Cancer (HCC)    BREAST - left   Cigarette smoker    Colon polyp    Complication of anesthesia    DIFFICULTY AWAKENING, BP INCREASE AFTER CHOLECYSTECTEMY    COPD (chronic obstructive pulmonary disease) (HCC)    Depression    Diverticulosis of colon    DJD (degenerative joint disease)    Family history of breast cancer    Family history of colon cancer    Family history of pancreatic cancer    Family history of prostate cancer    Gallstones    GERD (gastroesophageal reflux disease)    Pt. denies having GERD. Unable to remove it.   History of anemia    History of bronchitis    History of sarcoidosis    Hyperlipidemia    no meds needed   Hypertension    Incisional breast wound    AT AGE 57 BILATERAL INCISION TO BREAST MADE   Joint pain    Leg swelling    Panic disorder    Personal history of radiation therapy    Pre-diabetes    S/P radiation therapy 08/17/11 - 09/28/11   LLQ - 50 Gy/25 Fractions with Boost of 10 Gy / 5 fractions   Sialoadenitis of submandibular gland 2016   Vitamin D  deficiency     Family History  Problem Relation Age of Onset   Obesity Mother    Alcohol abuse Mother    Depression Mother    Hyperlipidemia Mother    Dementia Mother    Hypertension Mother    Anemia Mother    Other Mother        + H Pylori   Seizures Mother    Heart disease Mother    Heart disease Father    Hyperlipidemia Father    Hypertension Father    Prostate cancer Father        dx over 51   Colon cancer Father 32       diagnosed 2009   Bladder Cancer Father        dx over 32   Colon polyps Father    Hypertension Sister    Breast cancer Sister        dx in her 30s; mat half sister   Pancreatic cancer Sister 2       d. 42   Lung cancer  Sister    Hypertension Sister    Sarcoidosis Sister        mat 1/2 sister   Multiple sclerosis Brother        #2   Hypertension Brother        #1   Hyperlipidemia Brother    Prostate cancer Brother        dx under 50   Dementia Maternal Aunt    Dementia Maternal Aunt    Seizures Maternal Uncle    Lung cancer Maternal Uncle    Lung cancer Paternal Aunt  Breast cancer Cousin        pat first cousin d. 79   Sarcoidosis Cousin        mat first cousin   Breast cancer Cousin        maternal 2nd cousins - distant   Brain cancer Other        benign   Esophageal cancer Neg Hx    Stomach cancer Neg Hx    Rectal cancer Neg Hx     Past Surgical History:  Procedure Laterality Date   BIOPSY BREAST     left breast  as a teenager  benign   BREAST BIOPSY Left 06/06/2011   BREAST EXCISIONAL BIOPSY Bilateral    BREAST LUMPECTOMY Left 2013   BREAST SURGERY  07/05/11   left breast lumpectomy with needle loc & axillary sln bx   BUNIONECTOMY  2011   bilateral by Dr Lea   CHOLECYSTECTOMY  02/2019   COLONOSCOPY     polyp   cyst removed from right hand  1995   POLYPECTOMY     TONSILLECTOMY  1972   TOTAL ABDOMINAL HYSTERECTOMY  2000   Dr Rosalynn   TOTAL KNEE ARTHROPLASTY Right 07/02/2023   Procedure: ARTHROPLASTY, KNEE, TOTAL;  Surgeon: Jerri Kay HERO, MD;  Location: [facility] OR;  Service: Orthopedics;  Laterality: Right;   TRIGGER FINGER RELEASE  2008   Dr Sissy   Social History   Occupational History   Occupation: retired  Tobacco Use   Smoking status: Former    Current packs/day: 0.00    Types: Cigarettes    Quit date: 12/28/2020    Years since quitting: 2.7   Smokeless tobacco: Never  Vaping Use   Vaping status: Never Used  Substance and Sexual Activity   Alcohol use: Yes    Alcohol/week: 0.0 - 1.0 standard drinks of alcohol    Comment: social   Drug use: Never   Sexual activity: Not Currently    Birth control/protection: Surgical    Comment: menses age 25,hrt started  2000

## 2023-09-17 ENCOUNTER — Encounter (INDEPENDENT_AMBULATORY_CARE_PROVIDER_SITE_OTHER): Payer: Self-pay | Admitting: Family Medicine

## 2023-09-17 ENCOUNTER — Ambulatory Visit (INDEPENDENT_AMBULATORY_CARE_PROVIDER_SITE_OTHER): Admitting: Family Medicine

## 2023-09-17 ENCOUNTER — Other Ambulatory Visit (INDEPENDENT_AMBULATORY_CARE_PROVIDER_SITE_OTHER): Payer: Self-pay | Admitting: Family Medicine

## 2023-09-17 VITALS — BP 133/79 | HR 77 | Temp 98.4°F | Ht 64.5 in | Wt 215.0 lb

## 2023-09-17 DIAGNOSIS — R7303 Prediabetes: Secondary | ICD-10-CM | POA: Diagnosis not present

## 2023-09-17 DIAGNOSIS — J069 Acute upper respiratory infection, unspecified: Secondary | ICD-10-CM | POA: Diagnosis not present

## 2023-09-17 DIAGNOSIS — E559 Vitamin D deficiency, unspecified: Secondary | ICD-10-CM

## 2023-09-17 DIAGNOSIS — I10 Essential (primary) hypertension: Secondary | ICD-10-CM

## 2023-09-17 DIAGNOSIS — Z6836 Body mass index (BMI) 36.0-36.9, adult: Secondary | ICD-10-CM

## 2023-09-17 DIAGNOSIS — E65 Localized adiposity: Secondary | ICD-10-CM | POA: Insufficient documentation

## 2023-09-17 MED ORDER — CHOLECALCIFEROL 1.25 MG (50000 UT) PO TABS
ORAL_TABLET | ORAL | 0 refills | Status: DC
Start: 2023-09-17 — End: 2023-10-17

## 2023-09-17 NOTE — Progress Notes (Signed)
 Marilyn [name], D.O.  ABFM, ABOM Specializing in Clinical Bariatric Medicine  Office located at: [address] [place], [place]  [address]   Assessment and Plan:   Medications Discontinued During This Encounter  Medication Reason   pantoprazole  (PROTONIX ) 40 MG tablet Completed Course   Cholecalciferol  1.25 MG (50000 UT) TABS Reorder     Meds ordered this encounter  Medications   Cholecalciferol  1.25 MG (50000 UT) TABS    Sig: 1 po q 7 days    Dispense:  4 tablet    Refill:  0     FOR THE DISEASE OF OBESITY:  BMI 36.0-36.9,adult Morbid Obesity -- Starting BMI; 37.1 Assessment & Plan: Since last office visit on 08/27/2023 patient's muscle mass has increased by 1.4 lbs. Fat mass has decreased by 5 lbs. Total body water has decreased by 2.4 lbs.  Counseling done on how various foods will affect these numbers and how to maximize success  Total lbs lost to date: -4 lbs Total weight loss percentage to date: -1.83 %   Recommended Dietary Goals Judithe is currently in the action stage of change. As such, her goal is to continue weight management plan.  She has agreed to: continue current plan   Behavioral Intervention We discussed the following today: increasing lean protein intake to established goals, continue to work on implementation of reduced calorie nutritional plan, and continue to practice mindfulness when eating  Additional resources provided today: None  Evidence-based interventions for health behavior change were utilized today including the discussion of self monitoring techniques, problem-solving barriers and SMART goal setting techniques.   Regarding patient's less desirable eating habits and patterns, we employed the technique of small changes.   Pt will specifically work on: n/a   Recommended Physical Activity Goals Farida has been advised to work up to 300-450 minutes of moderate intensity aerobic activity a week and strengthening exercises  2-3 times per week for cardiovascular health, weight loss maintenance and preservation of muscle mass.   She was encouraged to continue to gradually increase the amount and intensity of exercise routine   Pharmacotherapy Continue with nutritional and behavioral strategies    ASSOCIATED CONDITIONS ADDRESSED TODAY:   Prediabetes Assessment & Plan: Lab Results  Component Value Date   HGBA1C 5.8 (H) 08/13/2023   HGBA1C 6.4 02/08/2023   HGBA1C 6.2 02/09/2022   INSULIN  11.3 08/13/2023    Managed with diet and lifestyle interventions. She has pretty good control of hunger and cravings when following her prudent nutritional plan. No acute concerns.   - Educated patient that having adequate amounts of protein with each meal is important for increasing muscle mass, stabilizing sugars, controlling hunger and cravings, and improving thermogenesis. - Continue working on nutrition plan to decrease simple carbohydrates, increase lean proteins and exercise to promote weight loss and improve glycemic control and prevent progression to T2DM.    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 Annabella Scarce on 08/21/23. Blood pressure is controlled on Spironolactone  50 mg daily. Pt asx. No acute concerns.   - Continue adherence to antihypertensive therapy. -  Continue to work on nutrition plan to promote weight loss. - Advance exercise as tolerated.  - F/up with cardiology as directed.    Viral URI with cough Assessment & Plan: Patient developed symptoms like sore throat and cough on 7/13. States  that symptoms have been improving in the past 3-4 days. Chest sounds from today: globally decreased aeration but essentially cleared to auscultation bilaterally.   - Get plenty of rest and drink lots of warm fluids (e.g chicken broth).  - Consider Musnex DM and Delsym if needed.  - F/up with PCP if symptoms worsen.     Visceral obesity Assessment & Plan: Since LOV, her visceral fat rating has improved from 16 to 15. The visceral fat rating should be < 12 in a female.  - Visceral adipose tissue is a hormonally active component of total body fat. This body composition phenotype is associated with medical disorders such as metabolic syndrome, cardiovascular disease and several malignancies including prostate, breast, and colorectal cancers. - Goal: Lose 7-10% of weight via prudent nutritional plan and lifestyle changes.      Vitamin D  deficiency Assessment & Plan: Lab Results  Component Value Date   VD25OH 27.8 (L) 08/13/2023   VD25OH 34.46 07/27/2015   VD25OH 36.92 07/23/2014   Pt is on prescription Cholecalciferol  50,000 units weekly without any complications.   - I discussed the importance of vitamin D  to the patient's health and well-being as well as to their ability to lose weight.  - Continue vitamin D  supplementation. - Recheck levels in the future.    Follow up:   Return 10/17/2023 at 10:00 AM.  She was informed of the importance of frequent follow up visits to maximize her success with intensive lifestyle modifications for her multiple health conditions.   Subjective:   Chief complaint: Obesity [name] is here to discuss her progress with her obesity treatment plan. She is on the Category 1 Plan with L options and states she is following her eating plan approximately 75% of the time. She states she is doing exercises per Physical Therapy 45 minutes 2 days per week.   Interval History:  [name] is here for a follow up office visit. Since last OV on  08/27/2023, she has been back and forth to the hospital for her husband. Her husband is doing better now. Food wise, she is trying to increase her protein, fruit, and veggie intake. She is down 4 lbs today.    Pharmacotherapy that aid with weight loss: none .    Review of Systems:  Pertinent positives were addressed with patient today.  Reviewed by clinician on day of visit: allergies, medications, problem list, medical history, surgical history, family history, social history, and previous encounter notes.  Weight Summary and Biometrics   Weight Lost Since Last Visit: 4  Weight Gained Since Last Visit: 0   Vitals Temp: 98.4 F (36.9 C) BP: 133/79 Pulse Rate: 77 SpO2: 98 %   Anthropometric Measurements Height: 5' 4.5 (1.638 m) Weight: 215 lb (97.5 kg) BMI (Calculated): 36.35 Weight at Last Visit: 219 lb Weight Lost Since Last Visit: 4 Weight Gained Since Last Visit: 0 Starting Weight: 219 lb Total Weight Loss (lbs): 4 lb (1.814 kg)   Body Composition  Body Fat %: 48.3 % Fat Mass (lbs): 104 lbs Muscle Mass (lbs): 105.8 lbs Total Body Water (lbs): 78.4 lbs Visceral Fat Rating : 15   Other Clinical Data Fasting: no Labs: no Today's Visit #: 3 Starting Date: 08/13/23 Comments:  Cat 1    Objective:   PHYSICAL EXAM: Blood pressure 133/79, pulse 77, temperature 98.4 F (36.9 C), height 5' 4.5 (1.638 m), weight 215 lb (97.5 kg), SpO2 98%. Body mass index is 36.33 kg/m.  General: she is overweight, cooperative and in no acute distress. PSYCH: Has normal mood, affect and thought process.   HEENT: EOMI, sclerae are anicteric. Lungs: Normal breathing effort, no conversational dyspnea. Extremities: Moves * 4 Neurologic: A and O * 3, good insight  DIAGNOSTIC DATA REVIEWED: BMET    Component Value Date/Time   NA 138 08/13/2023 1055   NA 138 11/04/2013 0929   K 4.5 08/13/2023 1055   K 3.8 11/04/2013 0929   CL 101 08/13/2023 1055   CL 107 03/22/2012 0853    CO2 17 (L) 08/13/2023 1055   CO2 23 11/04/2013 0929   GLUCOSE 90 08/13/2023 1055   GLUCOSE 89 06/22/2023 1400   GLUCOSE 100 11/04/2013 0929   GLUCOSE 99 03/22/2012 0853   BUN 11 08/13/2023 1055   BUN 12.1 11/04/2013 0929   CREATININE 0.73 08/13/2023 1055   CREATININE 0.8 11/04/2013 0929   CALCIUM  10.1 08/13/2023 1055   CALCIUM  9.6 11/04/2013 0929   GFRNONAA >60 06/22/2023 1400   GFRAA >60 07/14/2016 2305   Lab Results  Component Value Date   HGBA1C 5.8 (H) 08/13/2023   HGBA1C 6.2 02/09/2022   Lab Results  Component Value Date   INSULIN  11.3 08/13/2023   Lab Results  Component Value Date   TSH 1.420 08/13/2023   CBC    Component Value Date/Time   WBC 8.9 08/13/2023 1055   WBC 10.2 06/22/2023 1400   RBC 5.12 08/13/2023 1055   RBC 5.32 (H) 06/22/2023 1400   HGB 11.9 08/13/2023 1055   HGB 12.2 11/04/2013 0928   HCT 40.1 08/13/2023 1055   HCT 38.9 11/04/2013 0928   PLT 283 08/13/2023 1055   MCV 78 (L) 08/13/2023 1055   MCV 74.7 (L) 11/04/2013 0928   MCH 23.2 (L) 08/13/2023 1055   MCH 23.5 (L) 06/22/2023 1400   MCHC 29.7 (L) 08/13/2023 1055   MCHC 31.0 06/22/2023 1400   RDW 14.4 08/13/2023 1055   RDW 14.7 (H) 11/04/2013 0928   Iron Studies    Component Value Date/Time   IRON 74 12/25/2019 1415   TIBC 319 12/25/2019 1415   FERRITIN 110 12/25/2019 1415   IRONPCTSAT 23 12/25/2019 1415   Lipid Panel     Component Value Date/Time   CHOL 158 08/13/2023 1055   TRIG 74 08/13/2023 1055   HDL 78 08/13/2023 1055   CHOLHDL 2.0 08/13/2023 1055   CHOLHDL 3 02/08/2023 1127   VLDL 14.6 02/08/2023 1127   LDLCALC 66 08/13/2023 1055   LDLDIRECT 102 (H) 10/14/2021 1326   LDLDIRECT 118.9 12/18/2011 1138   Hepatic Function Panel     Component Value Date/Time   PROT 7.4 08/13/2023 1055   PROT 7.2 11/04/2013 0929   ALBUMIN 4.5 08/13/2023 1055   ALBUMIN 3.6 11/04/2013 0929   AST 14 08/13/2023 1055   AST 12 11/04/2013 0929   ALT 13 08/13/2023 1055   ALT 13 11/04/2013  0929   ALKPHOS 104 08/13/2023 1055   ALKPHOS 108 11/04/2013 0929   BILITOT 0.4 08/13/2023 1055   BILITOT 0.28 11/04/2013 0929   BILIDIR 0.0 11/12/2018 0912      Component Value Date/Time   TSH 1.420 08/13/2023 1055   Nutritional Lab Results  Component Value Date   VD25OH 27.8 (L) 08/13/2023  VD25OH 34.46 07/27/2015   VD25OH 36.92 07/23/2014    Attestations:   I, Special Puri, acting as a Stage manager for Marilyn Jenkins, DO., have compiled all relevant documentation for today's office visit on behalf of Marilyn Jenkins, DO, while in the presence of [employer], DO.  I have reviewed the above documentation for accuracy and completeness, and I agree with the above. Marilyn JINNY [name], D.O.  The 21st Century Cures Act was signed into law in 2016 which includes the topic of electronic health records.  This provides immediate access to information in MyChart. This includes consultation notes, operative notes, office notes, lab results and pathology reports.  If you have any questions about what you read please let us  know at your next visit so we can discuss your concerns and take corrective action if need be.  We are right here with you.

## 2023-09-18 ENCOUNTER — Ambulatory Visit (INDEPENDENT_AMBULATORY_CARE_PROVIDER_SITE_OTHER)

## 2023-09-18 DIAGNOSIS — R262 Difficulty in walking, not elsewhere classified: Secondary | ICD-10-CM

## 2023-09-18 DIAGNOSIS — M25661 Stiffness of right knee, not elsewhere classified: Secondary | ICD-10-CM | POA: Diagnosis not present

## 2023-09-18 DIAGNOSIS — M6281 Muscle weakness (generalized): Secondary | ICD-10-CM

## 2023-09-18 DIAGNOSIS — M25561 Pain in right knee: Secondary | ICD-10-CM

## 2023-09-18 NOTE — Therapy (Signed)
 OUTPATIENT PHYSICAL THERAPY LOWER EXTREMITY TREATMENT /DISCHARGE NOTE   Patient Name: Marilyn [name] MRN: [id number] DOB:24-Mar-1955, 68 y.o., female Today's Date: 09/18/2023       END OF SESSION:  PT End of Session - 09/18/23 1100     Visit Number 17    Number of Visits 20    Date for PT Re-Evaluation 10/11/23    Authorization Type [employer] U[employer]    Authorization Time Period 07/20/23 to 09/14/23    PT Start Time 1015    PT Stop Time 1100    PT Time Calculation (min) 45 min    Activity Tolerance Patient tolerated treatment well    Behavior During Therapy [facility] for tasks assessed/performed             Past Medical History:  Diagnosis Date   Allergy    Anemia    hx   Anxiety    panic disorder   Breast cancer (HCC) 06/06/2011   bc  left breast 3 o'clock dx=invasive ductal ca uoqER/PR=positive   Cancer (HCC)    BREAST - left   Cigarette smoker    Colon polyp    Complication of anesthesia    DIFFICULTY AWAKENING, BP INCREASE AFTER CHOLECYSTECTEMY    COPD (chronic obstructive pulmonary disease) (HCC)    Depression    Diverticulosis of colon    DJD (degenerative joint disease)    Family history of breast cancer    Family history of colon cancer    Family history of pancreatic cancer    Family history of prostate cancer    Gallstones    GERD (gastroesophageal reflux disease)    Pt. denies having GERD. Unable to remove it.   History of anemia    History of bronchitis    History of sarcoidosis    Hyperlipidemia    no meds needed   Hypertension    Incisional breast wound    AT AGE 70 BILATERAL INCISION TO BREAST MADE   Joint pain    Leg swelling    Panic disorder    Personal history of radiation therapy    Pre-diabetes    S/P radiation therapy 08/17/11 - 09/28/11   LLQ - 50 Gy/25 Fractions with Boost of 10 Gy / 5 fractions   Sialoadenitis of submandibular gland 2016   Vitamin D  deficiency    Past Surgical History:  Procedure Laterality Date   BIOPSY  BREAST     left breast  as a teenager  benign   BREAST BIOPSY Left 06/06/2011   BREAST EXCISIONAL BIOPSY Bilateral    BREAST LUMPECTOMY Left 2013   BREAST SURGERY  07/05/11   left breast lumpectomy with needle loc & axillary sln bx   BUNIONECTOMY  2011   bilateral by Dr Lea   CHOLECYSTECTOMY  02/2019   COLONOSCOPY     polyp   cyst removed from right hand  1995   POLYPECTOMY     TONSILLECTOMY  1972   TOTAL ABDOMINAL HYSTERECTOMY  2000   Dr Rosalynn   TOTAL KNEE ARTHROPLASTY Right 07/02/2023   Procedure: ARTHROPLASTY, KNEE, TOTAL;  Surgeon: Jerri Kay HERO, MD;  Location: [facility] OR;  Service: Orthopedics;  Laterality: Right;   TRIGGER FINGER RELEASE  2008   Dr Sissy   Patient Active Problem List   Diagnosis Date Noted   Hypertension 09/17/2023   Visceral obesity 09/17/2023   Prediabetes- new onset 08/27/2023   Mixed hyperlipidemia 08/27/2023   Iron deficiency 08/27/2023   Status post total right knee  replacement 07/02/2023   Primary osteoarthritis of right knee 07/01/2023   Hypercholesterolemia 05/10/2022   Trochanteric bursitis of right hip 05/10/2022   Preop exam for internal medicine 02/09/2022   Plantar flexed metatarsal bone of right foot 01/11/2022   Grief 09/28/2021   Leg swelling 04/18/2021   Incisional breast wound 04/18/2021   History of sarcoidosis 04/18/2021   History of bronchitis 04/18/2021   GERD (gastroesophageal reflux disease) 04/18/2021   Family history of prostate cancer 04/18/2021   Depression 04/18/2021   Complication of anesthesia 04/18/2021   Colon polyp 04/18/2021   Cigarette smoker 04/18/2021   Cancer (HCC) 04/18/2021   Coronary atherosclerosis 01/30/2021   Aortic atherosclerosis (HCC) 01/30/2021   Primary hypertension 01/14/2021   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   Knee pain, chronic 08/28/2019   Elevated BP without diagnosis of hypertension 08/28/2019   Callus of foot 08/28/2019   Cholelithiasis 12/26/2018   Genetic testing 12/26/2017    Family history of breast cancer    Family history of pancreatic cancer    Family history of colon cancer    Abdominal pain 07/28/2016   Morbid obesity (HCC) 03/13/2016   Dyspnea 11/09/2015   Rash and nonspecific skin eruption 07/27/2015   Well adult exam 07/23/2014   Sialoadenitis 06/02/2014   Edema 07/07/2013   AB (asthmatic bronchitis) 06/23/2013   Anxiety    Malignant neoplasm of upper-outer quadrant of left breast in female, 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   Allergy 06/06/2011   COLONIC POLYPS 01/25/2010   DIVERTICULOSIS OF COLON 01/25/2010   DEGENERATIVE JOINT DISEASE 11/06/2008   PANIC DISORDER 07/23/2008   Sarcoidosis 10/13/2007   Vitamin D  deficiency 10/13/2007   ANEMIA 10/13/2007   BRONCHITIS 10/13/2007   Dyslipidemia 04/23/2007   Adjustment disorder with mixed anxiety and depressed mood 04/23/2007   Venous (peripheral) insufficiency 04/23/2007   HEADACHE 04/23/2007    PCP: Garald Scrape MD   REFERRING PROVIDER: Jule Ronal CROME, PA-C  REFERRING DIAG: Diagnosis [phone] (ICD-10-CM) - Status post total right knee replacement  THERAPY DIAG:  Stiffness of right knee, not elsewhere classified  Difficulty in walking, not elsewhere classified  Muscle weakness (generalized)  Acute pain of right knee  Rationale for Evaluation and Treatment: Rehabilitation  ONSET DATE: Surgery 07/02/23  SUBJECTIVE:   SUBJECTIVE STATEMENT: Patient arrived stating pain is increased today, affecting walking because she couldn't take her pain meds in order to drive.  She saw the referring MD who stated she is doing well and can continue with self progressed exercises at the gym.    PERTINENT HISTORY: See above   PAIN:  NPRS scale: in last 24 hours:  5/10.  Pain location: Right knee Pain description: Tightness Aggravating factors: Flexion Relieving factors: ice, elevation, not bending knee   PRECAUTIONS: Fall  WEIGHT BEARING RESTRICTIONS: No  FALLS:  Has  patient fallen in last 6 months? No, no close calls, no FOF   LIVING ENVIRONMENT: Lives with: lives with their spouse Lives in: House/apartment Stairs: 5 STE, has steep flight of steps inside home but stair lift available for use  Has following equipment at home: Vannie - 2 wheeled, Shower bench, and stair lift, cane   OCCUPATION: retiredHydrographic surveyor at [employer]   PATIENT GOALS: be able to move without pain, get more mobile    OBJECTIVE:  Note: Objective measures were completed at Evaluation unless otherwise noted.   PATIENT SURVEYS:   Patient-Specific Activity Scoring Scheme  0 represents "unable to perform." 10 represents "able to perform at prior  level. 0 1 2 3 4 5 6 7 8 9  10 (Date and Score)   Activity Eval   08/16/23 7/22  1. Getting up from chairs   5    6 10   2. Picking something up  3  3 9   3. Getting comfortable in bed  1 5 8   Score 3 4.67 9   Total score = sum of the activity scores/number of activities Minimum detectable change (90%CI) for average score = 2 points Minimum detectable change (90%CI) for single activity score = 3 points  COGNITION: Overall cognitive status: Within functional limits for tasks assessed     SENSATION: Not tested  EDEMA:  Appropriate for post-op condition   LOWER EXTREMITY ROM:  Active ROM Right eval Rt 07/25/23 Rt 07/31/23 Rt 08/14/23 Right 08/16/23 Right 08/28/23 Right 09/04/23 Right 09/06/23  Knee flexion Supine heel slide 52*, HS curl in sitting 72* Supine AA: 65 P: 78 Supine AA: 80  Supine A: 85 P: 90 Active 90 Seated P: 96* A: 92* A: 90 P: 103 AROM in supine heel slide 92  Knee extension 9* supine with heel prop  A: -10 P: -8 AA: -5 A: -7 P: -5 Active -2      (Blank rows = not tested)  LOWER EXTREMITY MMT:  MMT Right eval Left eval Right 08/08/2023  Knee flexion 3- 4+ 4/5  Knee extension 4- 4+ 4+/5   (Blank rows = not tested)    FUNCTIONAL TESTS:  08/30/2023:  TUG without device 19.5 sec  Eval:  Timed  up and go (TUG): 29.7 seconds RW 2 3 minute walk test: 210ft, RW   GAIT: 09/18/23 SPC for community ambulation distances as needed.   09/06/2023: SPC ambulation into and during clinic visit.   Eval:  Distance walked: 21ft Assistive device utilized: Environmental consultant - 2 wheeled Level of assistance: Modified independence Comments: limited heel toe pattern, antalgic gait, tends to lean heavily on RW                    TODAY's TREATMENT       09/18/23 Rec Bike Seat 7 level 0  6 min Leg press 100# 2x15  SL 43# 2x15  Neuro Re ed: Tandem stance and heel to toe ambulation in parallel bars Step ups leading with R 4 inch /6 inch step Step offs leading with the L 4 and 6 step Up up down down with Airex  Gait: Reviewed ascending and descending flight of stairs Used handrail as set up at home     DATE: 09/12/2023 Therapeutic Activities:   SciFit bike, seat 11, level 1, for 9 minutes. PT educated patient about using bike at local gym. Educated about seat level, intensity, and duration. Patient verbalized understanding.  Knee extension machine, BL 10x 15# , BL concentric & SL eccentric 5# 20x Rt & 10x Lt. PT educated patient about using machine at local gym and how to adjust seat back, leg piece, and weight for optimal usage. Patient verbalized understanding.  Knee flexion machine, BL 10x 25#, SL 10# 20x Rt & 10x Lt. PT educated patient about using machine at local gym and how to adjust seat back, leg piece, and weight for optimal usage. Patient verbalized understanding.  BL leg press, 100#, 2x10 and SL leg press, 43#, 2x10. PT educated patient about using machine at local gym and how to adjust seat and weight for optimal usage. Patient verbalized understanding.  PT updated and verbally reviewed HEP with patient. PT  recommended patient go to local gym before next session to try new exercises in HEP and come back with any questions. Provided handout of HEP and patient verbalized understanding.     TREATMENT      DATE: 09/10/2023 Therapeutic Exercise:   Nustep lvl 6 10 mins UE/LE Incline gastroc 30 sec x 3 Standing bilateral PF on incline board 3 x 10 between stretch time Seated Rt leg LAQ with end range hold 2 secs each way 4 lbs 2 x 15 for ROM - opposite leg movement opposite (cues for home use to help tightness complaints throughout the day  TherActivity Leg press double leg in available knee flexion range 2 x 15 100 lbs, single leg 43 lbs  x 15 bilaterally  Step on over and down WB on Rt 4 inch step x 10 with light hand assist on bar (Rt hand to mimic at home)  Neuro Re-ed Tandem stance 30 seconds x 1 bilateral on floor EO with very minimal HHA on bars, 30 sec x 1 bilateral EC with SBA, occasional HHA on bars SLS with contralateral leg tapping black mat corners x 6 each, performed bilaterally with occasional HHA on bars    Vaso: 10 mins medium compression 34 deg in elevation    TREATMENT      DATE: 09/06/2023 Therex: Nustep lvl 6 10 mins UE/LE Incline gastroc 30 sec x 3 Standing bilateral PF on incline board 3 x 10 between stretch time Seated Rt leg LAQ with end range hold 2 secs each way 4 lbs 2 x 15 for ROM - opposite leg movement opposite (cues for home use to help tightness complaints throughout the day  TherActivity Leg press double leg in available knee flexion range 2 x 15 100 lbs, single leg 43 lbs  x 15 bilaterally  Step on over and down WB on Rt 4 inch step x 10 with light hand assist on bar (Rt hand to mimic at home)  Neuro Re-ed Tandem stance 30 seconds x 1 bilateral on floor EO with very minimal HHA on bars, 30 sec x 1 bilateral EC with SBA, occasional HHA on bars SLS with contralateral leg tapping black mat corners x 6 each, performed bilaterally with occasional HHA on bars    Vaso: 10 mins medium compression 34 deg in elevation   PATIENT EDUCATION:  Education details: exam findings, POC, HEP  Person educated: Patient Education method:  Programmer, multimedia, Demonstration, and Handouts Education comprehension: verbalized understanding, returned demonstration, and needs further education  HOME EXERCISE PROGRAM: Access Code: [email or web address] URL: [email or web address] Date: 09/12/2023 Prepared by: Grayce Spatz   Exercises - Standing Heel Raise with Support  - 1 x daily - 7 x weekly - 2 sets - 10 reps - Hip Abduction with Resistance Loop  - 1 x daily - 7 x weekly - 2 sets - 10 reps - Hip Extension with Resistance Loop  - 1 x daily - 7 x weekly - 2 sets - 10 reps - Sit to Stand with Arm Reach Toward Target  - 1 x daily - 7 x weekly - 1 sets - 10 reps - Seated Knee Extension with Resistance  - 1 x daily - 7 x weekly - 2 sets - 10 reps - Seated Hamstring Curls with Resistance  - 1 x daily - 7 x weekly - 2 sets - 10 reps - Seated Knee Flexion Stretch  - 1-2 x daily - 7 x weekly - 2-3 sets - 20-30 sec hold - Standing Knee  Flexion Stretch on Step  - 1-2 x daily - 7 x weekly - 3 sets - 3 reps - 10 seconds hold - Standing Hamstring Stretch with Step (Mirrored)  - 1-2 x daily - 7 x weekly - 1 sets - 3 reps - 30 seconds hold - Modified Thomas Stretch  - 1-2 x daily - 7 x weekly - 1 sets - 3 reps - 30 seconds hold - Supine Quadriceps Stretch with Strap on Table (Mirrored)  - 1-2 x daily - 7 x weekly - 1 sets - 3 reps - 30 seconds hold - Gastroc Stretch on Step  - 1-2 x daily - 7 x weekly - 1 sets - 3 reps - 30 seconds hold - Tandem Stance  - 1 x daily - 4-5 x weekly - 3 sets - 2 reps - 30 seconds hold - Recumbent Bike  - 1 x daily - 3-5 x weekly - 3 sets - 1 reps - 10 minutes hold - Knee Extension with Weight Machine  - 1 x daily - 3-4 x weekly - 1-2 sets - 10-15 reps - 5 seconds hold - Eccentric Knee Extension with Weight Machine  - 1 x daily - 3-4 x weekly - 1-2 sets - 10 reps - 3-5 seconds hold - Hamstring Curl with Weight Machine  - 1 x daily - 3-4 x weekly - 1-2 sets - 10 reps - 5 seconds hold - Single Leg Hamstring Curl with Weight  Machine  - 1 x daily - 3-4 x weekly - 1-2 sets - 10 reps - 5 seconds hold - Full Leg Press  - 1 x daily - 3-4 x weekly - 2 sets - 10 reps - 5 seconds hold - Single Leg Press  - 1 x daily - 3-4 x weekly - 2 sets - 10 reps - 5 seconds hold  ASSESSMENT:  CLINICAL IMPRESSION:Patent with difficulty stepping down off 6 inch step leading with the L due to decreased knee flexion limitation. Pt has progressed in ROM, gait ability and distance, pain control and strength.   Most LTG completed. Pt states she wants to discharge per her discussion with MD.  OBJECTIVE IMPAIRMENTS: Abnormal gait, decreased activity tolerance, decreased balance, decreased knowledge of use of DME, decreased mobility, difficulty walking, decreased ROM, decreased strength, hypomobility, increased fascial restrictions, impaired flexibility, and pain.    GOALS: Goals reviewed with patient? No  SHORT TERM GOALS: Target date: 08/17/2023    Will be compliant with appropriate progressive HEP Goal status: MET 07/25/23   2. Rt knee AROM flexion to be at least and extension AROM to be at least 95* Goal status: Met 08/28/2023    3. Will be independent with edema management strategies  Goal status: MET  08/14/2023 (compression stockings, ice and elevation)   4. Gait pattern to have normalized with LRAD  Goal status: MET 08/14/23 c straight cane   5. Will complete TUG test in 15 seconds or less with LRAD to show improved functional balance  Goal status: Improved by 10 seconds but not to target   08/30/2023     LONG TERM GOALS: Target date: 10/11/2023    MMT to have improved by one grade all weak groups Goal status: on going  09/12/2023    2. Knee AROM to be WNL and pain free Goal status: on going 09/12/2023   3. Will be able to ascend and descend stairs reciprocally with no increase in pain Goal status: 50% met  09/18/2023 *Pt able to  ascend 1 flight of stairs reciprocally with use of handrail independently, unable to perform  full flight descending stairs in a reciprocal pattern.   4. Will be able to ambulate community distances and perform all household tasks with no increase in pain  Goal status: 09/18/23 on going 75% met with minimal to no pain   5. PSFS to have improved by at least 3 points to show improved QOL and subjective improvement  Goal status: MET 09/18/23   PLAN:  PT FREQUENCY: 1x/wk   PT DURATION: 4 weeks  PLANNED INTERVENTIONS: 97750- Physical Performance Testing, 97110-Therapeutic exercises, 97530- Therapeutic activity, 97112- Neuromuscular re-education, 97535- Self Care, 02859- Manual therapy, 97116- Gait training, [id number]- Vasopneumatic device, Balance training, Stair training, Taping, Dry Needling, and DME instructions  PLAN FOR NEXT SESSION:   Discharge to HEP and exercises at the gym to complete any unfinished goals.    Burnard CHRISTELLA Meth, PT 09/18/2023, 12:14 PM      PHYSICAL THERAPY DISCHARGE SUMMARY  Visits from Start of Care: 17  Current functional level related to goals / functional outcomes: See above   Remaining deficits: See above   Education / Equipment: HEP   Patient agrees to discharge. Patient goals were partially met. Patient is being discharged due to being pleased with the current functional level.    Burnard Meth, PT 09/18/23  12:36 PM

## 2023-09-19 ENCOUNTER — Other Ambulatory Visit: Payer: Self-pay | Admitting: Obstetrics and Gynecology

## 2023-09-19 DIAGNOSIS — Z1231 Encounter for screening mammogram for malignant neoplasm of breast: Secondary | ICD-10-CM

## 2023-09-26 ENCOUNTER — Other Ambulatory Visit: Payer: Self-pay | Admitting: Internal Medicine

## 2023-09-26 ENCOUNTER — Encounter (INDEPENDENT_AMBULATORY_CARE_PROVIDER_SITE_OTHER): Payer: Self-pay | Admitting: Family Medicine

## 2023-09-26 ENCOUNTER — Encounter: Payer: Self-pay | Admitting: Internal Medicine

## 2023-09-27 ENCOUNTER — Other Ambulatory Visit (INDEPENDENT_AMBULATORY_CARE_PROVIDER_SITE_OTHER): Payer: Self-pay | Admitting: Family Medicine

## 2023-09-27 ENCOUNTER — Other Ambulatory Visit (INDEPENDENT_AMBULATORY_CARE_PROVIDER_SITE_OTHER): Payer: Self-pay

## 2023-09-27 ENCOUNTER — Telehealth (INDEPENDENT_AMBULATORY_CARE_PROVIDER_SITE_OTHER): Payer: Self-pay

## 2023-09-27 DIAGNOSIS — E559 Vitamin D deficiency, unspecified: Secondary | ICD-10-CM

## 2023-09-27 NOTE — Telephone Encounter (Deleted)
 Marilyn [name]  (Key: [id number]) Wegovy 0.25MG /0.5ML auto-injectors Form [employer] Electronic Prior Authorization Form (2017 NCPDP) Please wait for [employer] 2017 NCP[place] to return a determination.

## 2023-09-27 NOTE — Telephone Encounter (Signed)
 N/a

## 2023-10-03 ENCOUNTER — Other Ambulatory Visit: Payer: Self-pay | Admitting: Internal Medicine

## 2023-10-03 MED ORDER — ALPRAZOLAM 1 MG PO TABS: 0.5000 mg | ORAL_TABLET | Freq: Three times a day (TID) | ORAL | 1 refills | Status: AC | PRN

## 2023-10-14 ENCOUNTER — Ambulatory Visit (INDEPENDENT_AMBULATORY_CARE_PROVIDER_SITE_OTHER)

## 2023-10-14 ENCOUNTER — Encounter (HOSPITAL_COMMUNITY): Payer: Self-pay

## 2023-10-14 ENCOUNTER — Ambulatory Visit (HOSPITAL_COMMUNITY)
Admission: EM | Admit: 2023-10-14 | Discharge: 2023-10-14 | Disposition: A | Discharge status: Home / Self Care | Attending: Emergency Medicine | Admitting: Emergency Medicine

## 2023-10-14 DIAGNOSIS — R059 Cough, unspecified: Secondary | ICD-10-CM | POA: Diagnosis not present

## 2023-10-14 DIAGNOSIS — J069 Acute upper respiratory infection, unspecified: Secondary | ICD-10-CM

## 2023-10-14 MED ORDER — BENZONATATE 100 MG PO CAPS: 100.0000 mg | ORAL_CAPSULE | Freq: Three times a day (TID) | ORAL | 0 refills | Status: DC

## 2023-10-14 MED ORDER — AZITHROMYCIN 250 MG PO TABS: 250.0000 mg | ORAL_TABLET | Freq: Every day | ORAL | 0 refills | Status: DC

## 2023-10-14 NOTE — Discharge Instructions (Signed)
 Take the antibiotics as prescribed, take them with food to prevent stomach upset.  You can use the Tessalon  Perles every 8 hours to help suppress your cough.  Use over-the-counter Mucinex 1200 mg daily to help loosen secretions in addition to drinking at least 64 ounces of water daily.  Medication should improve your symptoms, if no improvement over the next week or anything changes please follow-up with your primary care provider for reevaluation.

## 2023-10-14 NOTE — ED Provider Notes (Signed)
 [facility]    CSN: [id number] Arrival date & time: 10/14/23  1126      History   Chief Complaint No chief complaint on file.   HPI Marilyn [name] is a 68 y.o. female.   Patient presents to clinic over concern of productive cough with yellow sputum, sneezing, congestion and rhinorrhea. Symptoms have been present since 7/21. Did get a little better for a few days and then symptoms returned. Has not had fevers, wheezing or shortness of breath. Tried OTC medications without relief.   The history is provided by the patient and medical records.    Past Medical History:  Diagnosis Date   Allergy    Anemia    hx   Anxiety    panic disorder   Breast cancer (HCC) 06/06/2011   bc  left breast 3 o'clock dx=invasive ductal ca uoqER/PR=positive   Cancer (HCC)    BREAST - left   Cigarette smoker    Colon polyp    Complication of anesthesia    DIFFICULTY AWAKENING, BP INCREASE AFTER CHOLECYSTECTEMY    COPD (chronic obstructive pulmonary disease) (HCC)    Depression    Diverticulosis of colon    DJD (degenerative joint disease)    Family history of breast cancer    Family history of colon cancer    Family history of pancreatic cancer    Family history of prostate cancer    Gallstones    GERD (gastroesophageal reflux disease)    Pt. denies having GERD. Unable to remove it.   History of anemia    History of bronchitis    History of sarcoidosis    Hyperlipidemia    no meds needed   Hypertension    Incisional breast wound    AT AGE 22 BILATERAL INCISION TO BREAST MADE   Joint pain    Leg swelling    Panic disorder    Personal history of radiation therapy    Pre-diabetes    S/P radiation therapy 08/17/11 - 09/28/11   LLQ - 50 Gy/25 Fractions with Boost of 10 Gy / 5 fractions   Sialoadenitis of submandibular gland 2016   Vitamin D  deficiency     Patient Active Problem List   Diagnosis Date Noted   Hypertension 09/17/2023   Visceral obesity 09/17/2023    Prediabetes- new onset 08/27/2023   Mixed hyperlipidemia 08/27/2023   Iron deficiency 08/27/2023   Status post total right knee replacement 07/02/2023   Primary osteoarthritis of right knee 07/01/2023   Hypercholesterolemia 05/10/2022   Trochanteric bursitis of right hip 05/10/2022   Preop exam for internal medicine 02/09/2022   Plantar flexed metatarsal bone of right foot 01/11/2022   Grief 09/28/2021   Leg swelling 04/18/2021   Incisional breast wound 04/18/2021   History of sarcoidosis 04/18/2021   History of bronchitis 04/18/2021   GERD (gastroesophageal reflux disease) 04/18/2021   Family history of prostate cancer 04/18/2021   Depression 04/18/2021   Complication of anesthesia 04/18/2021   Colon polyp 04/18/2021   Cigarette smoker 04/18/2021   Cancer (HCC) 04/18/2021   Coronary atherosclerosis 01/30/2021   Aortic atherosclerosis (HCC) 01/30/2021   Primary hypertension 01/14/2021   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   Knee pain, chronic 08/28/2019   Elevated BP without diagnosis of hypertension 08/28/2019   Callus of foot 08/28/2019   Cholelithiasis 12/26/2018   Genetic testing 12/26/2017   Family history of breast cancer    Family history of pancreatic cancer    Family history of  colon cancer    Abdominal pain 07/28/2016   Morbid obesity (HCC) 03/13/2016   Dyspnea 11/09/2015   Rash and nonspecific skin eruption 07/27/2015   Well adult exam 07/23/2014   Sialoadenitis 06/02/2014   Edema 07/07/2013   AB (asthmatic bronchitis) 06/23/2013   Anxiety    Malignant neoplasm of upper-outer quadrant of left breast in female, 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   Allergy 06/06/2011   COLONIC POLYPS 01/25/2010   DIVERTICULOSIS OF COLON 01/25/2010   DEGENERATIVE JOINT DISEASE 11/06/2008   PANIC DISORDER 07/23/2008   Sarcoidosis 10/13/2007   Vitamin D  deficiency 10/13/2007   ANEMIA 10/13/2007   BRONCHITIS 10/13/2007   Dyslipidemia 04/23/2007   Adjustment disorder  with mixed anxiety and depressed mood 04/23/2007   Venous (peripheral) insufficiency 04/23/2007   HEADACHE 04/23/2007    Past Surgical History:  Procedure Laterality Date   BIOPSY BREAST     left breast  as a teenager  benign   BREAST BIOPSY Left 06/06/2011   BREAST EXCISIONAL BIOPSY Bilateral    BREAST LUMPECTOMY Left 2013   BREAST SURGERY  07/05/11   left breast lumpectomy with needle loc & axillary sln bx   BUNIONECTOMY  2011   bilateral by Dr Lea   CHOLECYSTECTOMY  02/2019   COLONOSCOPY     polyp   cyst removed from right hand  1995   POLYPECTOMY     TONSILLECTOMY  1972   TOTAL ABDOMINAL HYSTERECTOMY  2000   Dr Rosalynn   TOTAL KNEE ARTHROPLASTY Right 07/02/2023   Procedure: ARTHROPLASTY, KNEE, TOTAL;  Surgeon: Jerri Kay HERO, MD;  Location: [facility] OR;  Service: Orthopedics;  Laterality: Right;   TRIGGER FINGER RELEASE  2008   Dr Sissy    OB History   No obstetric history on file.    Obstetric Comments  Menarche age 28, nulliparity, HRT x 1 year, Hysterectomy          Home Medications    Prior to Admission medications   Medication Sig Start Date End Date Taking? Authorizing Provider  azithromycin  (ZITHROMAX ) 250 MG tablet Take 1 tablet (250 mg total) by mouth daily. Take first 2 tablets together, then 1 every day until finished. 10/14/23  Yes Fallon Howerter  N, FNP  benzonatate  (TESSALON ) 100 MG capsule Take 1 capsule (100 mg total) by mouth every 8 (eight) hours. 10/14/23  Yes Hayes Czaja  N, FNP  albuterol  (VENTOLIN  HFA) 108 (90 Base) MCG/ACT inhaler Inhale 2 puffs into the lungs every 6 (six) hours as needed for wheezing or shortness of breath. 01/04/23   Olalere, Jennet LABOR, MD  ALPRAZolam  (XANAX ) 1 MG tablet Take 0.5-1 tablets (0.5-1 mg total) by mouth 3 (three) times daily as needed for anxiety. 10/03/23   Plotnikov, Aleksei V, MD  budesonide -formoterol  (SYMBICORT ) 80-4.5 MCG/ACT inhaler Inhale 2 puffs into the lungs 2 (two) times daily. 01/24/23   Plotnikov,  Karlynn GAILS, MD  busPIRone  (BUSPAR ) 30 MG tablet TAKE 1/2 TO 1 (ONE-HALF TO ONE) TABLET BY MOUTH TWICE DAILY Patient taking differently: Take 15 mg by mouth daily. 12/22/22   Plotnikov, Karlynn GAILS, MD  cetirizine  (ZYRTEC ) 10 MG chewable tablet Chew 10 mg by mouth daily.    [provider]  Cholecalciferol  1.25 MG (50000 UT) TABS 1 po q 7 days 09/17/23   Midge Sober, DO  HYDROcodone -acetaminophen  (NORCO/VICODIN) 5-325 MG tablet Take 1-2 tablets by mouth every 6 (six) hours as needed for moderate pain (pain score 4-6). 07/12/23   Jule Ronal CROME, PA-C  Menthol , Topical Analgesic, (  BIOFREEZE) 10 % CREA Apply 1 application  topically daily as needed (pain).    [provider]  rosuvastatin  (CRESTOR ) 10 MG tablet Take 1 tablet (10 mg total) by mouth daily. 03/15/23 09/17/23  Raford Riggs, MD  spironolactone  (ALDACTONE ) 50 MG tablet Take 1 tablet (50 mg total) by mouth daily. 05/01/23   Walker, Caitlin S, NP  Trolamine Salicylate (BLUE-EMU HEMP EX) Apply 1 Application topically daily as needed (pain).    [provider]    Family History Family History  Problem Relation Age of Onset   Obesity Mother    Alcohol abuse Mother    Depression Mother    Hyperlipidemia Mother    Dementia Mother    Hypertension Mother    Anemia Mother    Other Mother        + H Pylori   Seizures Mother    Heart disease Mother    Heart disease Father    Hyperlipidemia Father    Hypertension Father    Prostate cancer Father        dx over 24   Colon cancer Father 51       diagnosed 2009   Bladder Cancer Father        dx over 18   Colon polyps Father    Hypertension Sister    Breast cancer Sister        dx in her 30s; mat half sister   Pancreatic cancer Sister 2       d. 64   Lung cancer Sister    Hypertension Sister    Sarcoidosis Sister        mat 1/2 sister   Multiple sclerosis Brother        #2   Hypertension Brother        #1   Hyperlipidemia Brother    Prostate  cancer Brother        dx under 50   Dementia Maternal Aunt    Dementia Maternal Aunt    Seizures Maternal Uncle    Lung cancer Maternal Uncle    Lung cancer Paternal Aunt    Breast cancer Cousin        pat first cousin d. 76   Sarcoidosis Cousin        mat first cousin   Breast cancer Cousin        maternal 2nd cousins - distant   Brain cancer Other        benign   Esophageal cancer Neg Hx    Stomach cancer Neg Hx    Rectal cancer Neg Hx     Social History Social History   Tobacco Use   Smoking status: Former    Current packs/day: 0.00    Types: Cigarettes    Quit date: 12/28/2020    Years since quitting: 2.7   Smokeless tobacco: Never  Vaping Use   Vaping status: Never Used  Substance Use Topics   Alcohol use: Yes    Alcohol/week: 0.0 - 1.0 standard drinks of alcohol    Comment: social   Drug use: Never     Allergies   Lexapro [escitalopram oxalate], Microzide  [hydrochlorothiazide ], Oxycodone  hcl, and Penicillins   Review of Systems Review of Systems  Per HPI  Physical Exam Triage Vital Signs ED Triage Vitals  Encounter Vitals Group     BP 10/14/23 1350 132/72     Girls Systolic BP Percentile --      Girls Diastolic BP Percentile --  Boys Systolic BP Percentile --      Boys Diastolic BP Percentile --      Pulse Rate 10/14/23 1347 71     Resp 10/14/23 1347 18     Temp 10/14/23 1347 98.1 F (36.7 C)     Temp Source 10/14/23 1347 Oral     SpO2 10/14/23 1347 97 %     Weight --      Height --      Head Circumference --      Peak Flow --      Pain Score 10/14/23 1346 0     Pain Loc --      Pain Education --      Exclude from Growth Chart --    No data found.  Updated Vital Signs BP 132/72   Pulse 71   Temp 98.1 F (36.7 C) (Oral)   Resp 18   SpO2 97%   Visual Acuity Right Eye Distance:   Left Eye Distance:   Bilateral Distance:    Right Eye Near:   Left Eye Near:    Bilateral Near:     Physical Exam Vitals and nursing note  reviewed.  Constitutional:      Appearance: Normal appearance.  HENT:     Head: Normocephalic and atraumatic.     Right Ear: External ear normal.     Left Ear: External ear normal.     Nose: Congestion and rhinorrhea present.     Mouth/Throat:     Mouth: Mucous membranes are moist.  Eyes:     Conjunctiva/sclera: Conjunctivae normal.  Cardiovascular:     Rate and Rhythm: Normal rate and regular rhythm.     Heart sounds: Normal heart sounds. No murmur heard. Pulmonary:     Effort: Pulmonary effort is normal. No respiratory distress.     Breath sounds: No wheezing.  Skin:    General: Skin is warm and dry.  Neurological:     General: No focal deficit present.     Mental Status: She is alert and oriented to person, place, and time.  Psychiatric:        Mood and Affect: Mood normal.        Behavior: Behavior normal.      UC Treatments / Results  Labs (all labs ordered are listed, but only abnormal results are displayed) Labs Reviewed - No data to display  EKG   Radiology DG Chest 2 View Result Date: 10/14/2023 CLINICAL DATA:  Productive cough for 3 weeks. EXAM: CHEST - 2 VIEW COMPARISON:  02/09/2022. FINDINGS: The heart size and mediastinal contours are within normal limits. There is atherosclerotic calcification of the aorta. No consolidation, effusion, or pneumothorax is seen. Surgical clips are noted over the left breast. There are degenerative changes in the thoracic spine. No acute osseous abnormality. IMPRESSION: No active cardiopulmonary disease. Electronically Signed   By: Leita Birmingham M.D.   On: 10/14/2023 14:58    Procedures Procedures (including critical care time)  Medications Ordered in UC Medications - No data to display  Initial Impression / Assessment and Plan / UC Course  I have reviewed the triage vital signs and the nursing notes.  Pertinent labs & imaging results that were available during my care of the patient were reviewed by me and considered in  my medical decision making (see chart for details).  Vitals and triage reviewed, patient is hemodynamically stable.  Lungs vesicular, heart with regular rate and rhythm.  Chest x-ray by my interpretation does  not show any active cardiopulmonary disease, confirmed with radiology overread.  Due to duration of symptoms will place on azithromycin  to cover for bacterial URI, symptomatic management of cough discussed.  Plan of care, follow-up care and return precautions given, no questions at this time.     Final Clinical Impressions(s) / UC Diagnoses   Final diagnoses:  Cough, unspecified type  Upper respiratory tract infection, unspecified type     Discharge Instructions      Take the antibiotics as prescribed, take them with food to prevent stomach upset.  You can use the Tessalon  Perles every 8 hours to help suppress your cough.  Use over-the-counter Mucinex 1200 mg daily to help loosen secretions in addition to drinking at least 64 ounces of water daily.  Medication should improve your symptoms, if no improvement over the next week or anything changes please follow-up with your primary care provider for reevaluation.    ED Prescriptions     Medication Sig Dispense Auth. Provider   benzonatate  (TESSALON ) 100 MG capsule Take 1 capsule (100 mg total) by mouth every 8 (eight) hours. 21 capsule Dreama, Cataleya Cristina  N, FNP   azithromycin  (ZITHROMAX ) 250 MG tablet Take 1 tablet (250 mg total) by mouth daily. Take first 2 tablets together, then 1 every day until finished. 6 tablet Dreama, Kenli Waldo  N, FNP      PDMP not reviewed this encounter.   Dreama Margan Elias  N, FNP 10/14/23 1503

## 2023-10-14 NOTE — ED Triage Notes (Signed)
 Pt c/o productive cough (yellow sputum) x4-5 days with sneezing. Had URI for which she saw her PCP on 7/21. Symptoms had improved, and returned in the last few days.

## 2023-10-17 ENCOUNTER — Ambulatory Visit (INDEPENDENT_AMBULATORY_CARE_PROVIDER_SITE_OTHER): Admitting: Family Medicine

## 2023-10-17 ENCOUNTER — Encounter (INDEPENDENT_AMBULATORY_CARE_PROVIDER_SITE_OTHER): Payer: Self-pay | Admitting: Family Medicine

## 2023-10-17 DIAGNOSIS — J069 Acute upper respiratory infection, unspecified: Secondary | ICD-10-CM

## 2023-10-17 DIAGNOSIS — R7303 Prediabetes: Secondary | ICD-10-CM | POA: Diagnosis not present

## 2023-10-17 DIAGNOSIS — Z6836 Body mass index (BMI) 36.0-36.9, adult: Secondary | ICD-10-CM

## 2023-10-17 DIAGNOSIS — Z6837 Body mass index (BMI) 37.0-37.9, adult: Secondary | ICD-10-CM

## 2023-10-17 DIAGNOSIS — E559 Vitamin D deficiency, unspecified: Secondary | ICD-10-CM

## 2023-10-17 DIAGNOSIS — I10 Essential (primary) hypertension: Secondary | ICD-10-CM

## 2023-10-17 MED ORDER — CHOLECALCIFEROL 1.25 MG (50000 UT) PO TABS: ORAL_TABLET | ORAL | 0 refills | Status: DC

## 2023-10-17 NOTE — Progress Notes (Signed)
 Marilyn [name], D.O.  ABFM, ABOM Specializing in Clinical Bariatric Medicine  Office located at: [address] [place], [place]  [address]    Assessment and Plan:   Medications Discontinued During This Encounter  Medication Reason   Cholecalciferol  1.25 MG (50000 UT) TABS Reorder    Meds ordered this encounter  Medications   Cholecalciferol  1.25 MG (50000 UT) TABS    Sig: 1 po q 7 days    Dispense:  4 tablet    Refill:  0      FOR THE DISEASE OF OBESITY:  Morbid Obesity -- Starting BMI; 37.1 BMI 37.0-37.9, adult 37.01 current BMI 36.35 Assessment & Plan: Since last office visit on 09/17/23 patient's muscle mass has decreased by 0.4 lbs. Fat mass has increased by 0.8 lbs. Total body water has increased by 0.4 lbs.  Body fat % has increased by 0.3 %. Counseling done on how various foods will affect these numbers and how to maximize success  Total lbs lost to date: 4 lbs Total weight loss percentage to date: -1.83 %   Recommended Dietary Goals Marilyn [name] is currently in the action stage of change. As such, her goal is to continue weight management plan.  She has agreed to: continue current plan   Behavioral Intervention We discussed the following today: increasing lean protein intake to established goals, decreasing simple carbohydrates , work on meal planning and preparation, and continue to work on maintaining a reduced calorie state, getting the recommended amount of protein, incorporating whole foods, making healthy choices, staying well hydrated and practicing mindfulness when eating.  Additional resources provided today: None  Evidence-based interventions for health behavior change were utilized today including the discussion of self monitoring techniques, problem-solving barriers and SMART goal setting techniques.   Regarding patient's less desirable eating habits and patterns, we employed the technique of small changes.   Pt will specifically work on: n/a     Recommended Physical Activity Goals Marilyn [name] has been advised to work up to 300-450 minutes of moderate intensity aerobic activity a week and strengthening exercises 2-3 times per week for cardiovascular health, weight loss maintenance and preservation of muscle mass.   She has agreed to: Increase physical activity in their day and reduce sedentary time (increase NEAT). and Unable to participate in physical activity at present due to medical conditions    Pharmacotherapy We both agreed to: Continue with current nutritional and behavioral strategies   ASSOCIATED CONDITIONS ADDRESSED TODAY:  Upper respiratory infection, acute Assessment & Plan: Was seen at [facility] for recurring URI sx. Has not yet f/u with pulmonology, but plans to schedule a f/u soon. Currently is on Z-pak and tessalon  perles on 8/17 at [facility]. She is also taking Mucinex OTC. Reviewed UC note and labs.   Recommend she follow up with her PCP and pulmonology. Continue with current current regimen as prescribed. Stay properly hydrated by drinking half her body wt in ounces of water.     Prediabetes Assessment & Plan: Lab Results  Component Value Date   HGBA1C 5.8 (H) 08/13/2023   HGBA1C 6.4 02/08/2023   HGBA1C 6.2 02/09/2022   INSULIN  11.3 08/13/2023    Not currently on pharmacologic treatment. Diet and lifestyle intervention approach. Her A1c has improved to 5.8 per last obtained labs. Hunger/cravings are well controlled.   Continue working on following her nutritional meal plan by increasing protein and decreasing simple cabs and added sugars. Reviewed the role of protein as it relates to glycemic control and  wt loss/metabolism. Properly hydrate by drinking at least half her body wt in ounces of water. Encouraged pt to increase exercise, as able. Will continue monitoring condition.     Hypertension, unspecified type with aortic arthrosclerosis (12/22) Assessment & Plan: BP Readings from Last 3 Encounters:  10/17/23  114/76  10/14/23 132/72  09/17/23 133/79   BP is at goal. Spironolactone  50 mg once daily. Good compliance/tolerance. No adverse side effects reported. Pt is asx today; denies any hypertensive/hypotensive episodes.   Work on following her heart healthy diet by decreasing her sodium intake and increasing her protein intake. Increasing exercise, as able, will help improve her cardiovascular health and promote wt loss. Continue current antihypertensive regimen as prescribed    Vitamin D  deficiency Assessment & Plan: Lab Results  Component Value Date   VD25OH 27.8 (L) 08/13/2023   VD25OH 34.46 07/27/2015   VD25OH 36.92 07/23/2014   Has had to start taking OTC 50K IUs, stating she has not been able to obtain her vit D rx from the pharmacy  since 09/17/23. ERGO was initiated on 6/30.  Continue with current supplementation regimen. Will resend Cholecalciferol  50K UT once weekly and pt will attempt to obtain from the pharmacy again. Will continue monitoring; recheck Vit D levels 3-4 months from when supplementation was started.     Follow up:   Return for f/u on 11/12/2023 on 10:20 AM. Make additional appt x2 if pt desires. She was informed of the importance of frequent follow up visits to maximize her success with intensive lifestyle modifications for her multiple health conditions.  Subjective:   Chief complaint: Obesity Marilyn [name] is here to discuss her progress with her obesity treatment plan. She is on the Category 1 Plan with L options and states she is following her eating plan approximately 75% of the time. She states she is walking, doing the elliptical, and lower body exercises for 30 minutes 2 days per week.   Interval History:  Marilyn [name] is here for a follow up office visit. Since last OV on 09/17/23, she has maintained her weight. Struggling to eat enough of her whole foods, specifically not eating fruits. When she does eat fruit, she enjoys an apple. She has not been  measuring her daily intake. Tends to eat low-sodium [place] deli; gets it in 3 lb package, not individually packed. Is skipping meals - mainly d/t her recent URI sickness w decreased appetite.  Eating boiled eggs for breakfast, skipping lunch, and eating chicken or [place] for dinner. Struggling to sleep the recommended 7-9 hours. She has been going to sleep but wake up early and is unable to go back to sleep.    Pharmacotherapy that aid with weight loss: She is currently taking no anti-obesity medication.    Review of Systems:  Pertinent positives were addressed with patient today.  Reviewed by clinician on day of visit: allergies, medications, problem list, medical history, surgical history, family history, social history, and previous encounter notes.  Weight Summary and Biometrics   Weight Lost Since Last Visit: 0  Weight Gained Since Last Visit: 0    Vitals Temp: 98.1 F (36.7 C) BP: 114/76 Pulse Rate: 69 SpO2: 97 %   Anthropometric Measurements Height: 5' 4.5 (1.638 m) Weight: 215 lb (97.5 kg) BMI (Calculated): 36.35 Weight at Last Visit: 215lb Weight Lost Since Last Visit: 0 Weight Gained Since Last Visit: 0 Starting Weight: 219lb Total Weight Loss (lbs): 4 lb (1.814 kg) Peak Weight: 227lb Waist Measurement : 40 inches  Body Composition  Body Fat %: 48.6 % Fat Mass (lbs): 104.8 lbs Muscle Mass (lbs): 105.4 lbs Total Body Water (lbs): 78.8 lbs Visceral Fat Rating : 15   Other Clinical Data Fasting: yes Labs: no Today's Visit #: 4 Starting Date: 08/13/23    Objective:   PHYSICAL EXAM: Blood pressure 114/76, pulse 69, temperature 98.1 F (36.7 C), height 5' 4.5 (1.638 m), weight 215 lb (97.5 kg), SpO2 97%. Body mass index is 36.33 kg/m.  General: she is overweight, cooperative and in no acute distress. PSYCH: Has normal mood, affect and thought process.   HEENT: EOMI, sclerae are anicteric. Lungs: Normal breathing effort, no conversational  dyspnea. Extremities: Moves * 4 Neurologic: A and O * 3, good insight  DIAGNOSTIC DATA REVIEWED: BMET    Component Value Date/Time   NA 138 08/13/2023 1055   NA 138 11/04/2013 0929   K 4.5 08/13/2023 1055   K 3.8 11/04/2013 0929   CL 101 08/13/2023 1055   CL 107 03/22/2012 0853   CO2 17 (L) 08/13/2023 1055   CO2 23 11/04/2013 0929   GLUCOSE 90 08/13/2023 1055   GLUCOSE 89 06/22/2023 1400   GLUCOSE 100 11/04/2013 0929   GLUCOSE 99 03/22/2012 0853   BUN 11 08/13/2023 1055   BUN 12.1 11/04/2013 0929   CREATININE 0.73 08/13/2023 1055   CREATININE 0.8 11/04/2013 0929   CALCIUM  10.1 08/13/2023 1055   CALCIUM  9.6 11/04/2013 0929   GFRNONAA >60 06/22/2023 1400   GFRAA >60 07/14/2016 2305   Lab Results  Component Value Date   HGBA1C 5.8 (H) 08/13/2023   HGBA1C 6.2 02/09/2022   Lab Results  Component Value Date   INSULIN  11.3 08/13/2023   Lab Results  Component Value Date   TSH 1.420 08/13/2023   CBC    Component Value Date/Time   WBC 8.9 08/13/2023 1055   WBC 10.2 06/22/2023 1400   RBC 5.12 08/13/2023 1055   RBC 5.32 (H) 06/22/2023 1400   HGB 11.9 08/13/2023 1055   HGB 12.2 11/04/2013 0928   HCT 40.1 08/13/2023 1055   HCT 38.9 11/04/2013 0928   PLT 283 08/13/2023 1055   MCV 78 (L) 08/13/2023 1055   MCV 74.7 (L) 11/04/2013 0928   MCH 23.2 (L) 08/13/2023 1055   MCH 23.5 (L) 06/22/2023 1400   MCHC 29.7 (L) 08/13/2023 1055   MCHC 31.0 06/22/2023 1400   RDW 14.4 08/13/2023 1055   RDW 14.7 (H) 11/04/2013 0928   Iron Studies    Component Value Date/Time   IRON 74 12/25/2019 1415   TIBC 319 12/25/2019 1415   FERRITIN 110 12/25/2019 1415   IRONPCTSAT 23 12/25/2019 1415   Lipid Panel     Component Value Date/Time   CHOL 158 08/13/2023 1055   TRIG 74 08/13/2023 1055   HDL 78 08/13/2023 1055   CHOLHDL 2.0 08/13/2023 1055   CHOLHDL 3 02/08/2023 1127   VLDL 14.6 02/08/2023 1127   LDLCALC 66 08/13/2023 1055   LDLDIRECT 102 (H) 10/14/2021 1326   LDLDIRECT  118.9 12/18/2011 1138   Hepatic Function Panel     Component Value Date/Time   PROT 7.4 08/13/2023 1055   PROT 7.2 11/04/2013 0929   ALBUMIN 4.5 08/13/2023 1055   ALBUMIN 3.6 11/04/2013 0929   AST 14 08/13/2023 1055   AST 12 11/04/2013 0929   ALT 13 08/13/2023 1055   ALT 13 11/04/2013 0929   ALKPHOS 104 08/13/2023 1055   ALKPHOS 108 11/04/2013 0929   BILITOT 0.4 08/13/2023  1055   BILITOT 0.28 11/04/2013 0929   BILIDIR 0.0 11/12/2018 0912      Component Value Date/Time   TSH 1.420 08/13/2023 1055   Nutritional Lab Results  Component Value Date   VD25OH 27.8 (L) 08/13/2023   VD25OH 34.46 07/27/2015   VD25OH 36.92 07/23/2014    Attestations:   I, Vernell Forest, acting as a medical scribe for Marilyn Jenkins, DO., have compiled all relevant documentation for today's office visit on behalf of Marilyn Jenkins, DO, while in the presence of [employer], DO.   I have reviewed the above documentation for accuracy and completeness, and I agree with the above. Marilyn JINNY [name], D.O.  The 21st Century Cures Act was signed into law in 2016 which includes the topic of electronic health records.  This provides immediate access to information in MyChart.  This includes consultation notes, operative notes, office notes, lab results and pathology reports.  If you have any questions about what you read please let us  know at your next visit so we can discuss your concerns and take corrective action if need be.  We are right here with you.

## 2023-10-22 ENCOUNTER — Encounter: Payer: Self-pay | Admitting: Internal Medicine

## 2023-10-22 ENCOUNTER — Ambulatory Visit (INDEPENDENT_AMBULATORY_CARE_PROVIDER_SITE_OTHER): Admitting: Internal Medicine

## 2023-10-22 VITALS — BP 135/78 | HR 78 | Temp 98.1°F | Ht 65.45 in | Wt 218.0 lb

## 2023-10-22 DIAGNOSIS — D869 Sarcoidosis, unspecified: Secondary | ICD-10-CM

## 2023-10-22 DIAGNOSIS — G8929 Other chronic pain: Secondary | ICD-10-CM

## 2023-10-22 DIAGNOSIS — J40 Bronchitis, not specified as acute or chronic: Secondary | ICD-10-CM

## 2023-10-22 DIAGNOSIS — E559 Vitamin D deficiency, unspecified: Secondary | ICD-10-CM | POA: Diagnosis not present

## 2023-10-22 DIAGNOSIS — M25569 Pain in unspecified knee: Secondary | ICD-10-CM

## 2023-10-22 MED ORDER — MELOXICAM 15 MG PO TABS: 15.0000 mg | ORAL_TABLET | Freq: Every day | ORAL | 2 refills | Status: DC | PRN

## 2023-10-22 NOTE — Progress Notes (Signed)
 Subjective:  Patient ID: Marilyn [name], female    DOB: 27-Dec-1955  Age: 68 y.o. MRN: [id number]  CC: Hospitalization Follow-up (URI f/u and discuss a different pain med.)   HPI Marilyn [name] presents for a recent URI - pt went to UC on 8/17 - Z pack (better). C/o knee pain - 3 months post R TKR  Hydrocodone  - antsy, Oxy - hallucinations  Outpatient Medications Prior to Visit  Medication Sig Dispense Refill   albuterol  (VENTOLIN  HFA) 108 (90 Base) MCG/ACT inhaler Inhale 2 puffs into the lungs every 6 (six) hours as needed for wheezing or shortness of breath. 8 g 6   ALPRAZolam  (XANAX ) 1 MG tablet Take 0.5-1 tablets (0.5-1 mg total) by mouth 3 (three) times daily as needed for anxiety. 90 tablet 1   azithromycin  (ZITHROMAX ) 250 MG tablet Take 1 tablet (250 mg total) by mouth daily. Take first 2 tablets together, then 1 every day until finished. 6 tablet 0   benzonatate  (TESSALON ) 100 MG capsule Take 1 capsule (100 mg total) by mouth every 8 (eight) hours. 21 capsule 0   budesonide -formoterol  (SYMBICORT ) 80-4.5 MCG/ACT inhaler Inhale 2 puffs into the lungs 2 (two) times daily. 1 each 11   busPIRone  (BUSPAR ) 30 MG tablet TAKE 1/2 TO 1 (ONE-HALF TO ONE) TABLET BY MOUTH TWICE DAILY (Patient taking differently: Take 15 mg by mouth daily.) 60 tablet 2   cetirizine  (ZYRTEC ) 10 MG chewable tablet Chew 10 mg by mouth daily.     Cholecalciferol  1.25 MG (50000 UT) TABS 1 po q 7 days 4 tablet 0   HYDROcodone -acetaminophen  (NORCO/VICODIN) 5-325 MG tablet Take 1-2 tablets by mouth every 6 (six) hours as needed for moderate pain (pain score 4-6). 40 tablet 0   Menthol , Topical Analgesic, (BIOFREEZE) 10 % CREA Apply 1 application  topically daily as needed (pain).     rosuvastatin  (CRESTOR ) 10 MG tablet Take 1 tablet (10 mg total) by mouth daily. 90 tablet 3   spironolactone  (ALDACTONE ) 50 MG tablet Take 1 tablet (50 mg total) by mouth daily. 90 tablet 3   Trolamine Salicylate (BLUE-EMU  HEMP EX) Apply 1 Application topically daily as needed (pain).     No facility-administered medications prior to visit.    ROS: Review of Systems  Constitutional:  Negative for activity change, appetite change, chills, fatigue and unexpected weight change.  HENT:  Negative for congestion, mouth sores and sinus pressure.   Eyes:  Negative for visual disturbance.  Respiratory:  Negative for cough and chest tightness.   Gastrointestinal:  Negative for abdominal pain and nausea.  Genitourinary:  Negative for difficulty urinating, frequency and vaginal pain.  Musculoskeletal:  Positive for arthralgias and gait problem. Negative for back pain.  Skin:  Negative for pallor and rash.  Neurological:  Negative for dizziness, tremors, weakness, numbness and headaches.  Psychiatric/Behavioral:  Negative for confusion and sleep disturbance. The patient is nervous/anxious.     Objective:  BP 135/78   Pulse 78   Temp 98.1 F (36.7 C) (Oral)   Ht 5' 5.45 (1.662 m)   Wt 218 lb (98.9 kg)   SpO2 100%   BMI 35.78 kg/m   BP Readings from Last 3 Encounters:  10/22/23 135/78  10/17/23 114/76  10/14/23 132/72    Wt Readings from Last 3 Encounters:  10/22/23 218 lb (98.9 kg)  10/17/23 215 lb (97.5 kg)  09/17/23 215 lb (97.5 kg)    Physical Exam Constitutional:      General: She  is not in acute distress.    Appearance: She is well-developed.  HENT:     Head: Normocephalic.     Right Ear: External ear normal.     Left Ear: External ear normal.     Nose: Nose normal.  Eyes:     General:        Right eye: No discharge.        Left eye: No discharge.     Conjunctiva/sclera: Conjunctivae normal.     Pupils: Pupils are equal, round, and reactive to light.  Neck:     Thyroid : No thyromegaly.     Vascular: No JVD.     Trachea: No tracheal deviation.  Cardiovascular:     Rate and Rhythm: Normal rate and regular rhythm.     Heart sounds: Normal heart sounds.  Pulmonary:     Effort: No  respiratory distress.     Breath sounds: No stridor. No wheezing.  Abdominal:     General: Bowel sounds are normal. There is no distension.     Palpations: Abdomen is soft. There is no mass.     Tenderness: There is no abdominal tenderness. There is no guarding or rebound.  Musculoskeletal:        General: Tenderness present.     Cervical back: Normal range of motion and neck supple. No rigidity.  Lymphadenopathy:     Cervical: No cervical adenopathy.  Skin:    Findings: No erythema or rash.  Neurological:     Cranial Nerves: No cranial nerve deficit.     Motor: No abnormal muscle tone.     Coordination: Coordination normal.     Deep Tendon Reflexes: Reflexes normal.  Psychiatric:        Behavior: Behavior normal.        Thought Content: Thought content normal.        Judgment: Judgment normal.     Lab Results  Component Value Date   WBC 8.9 08/13/2023   HGB 11.9 08/13/2023   HCT 40.1 08/13/2023   PLT 283 08/13/2023   GLUCOSE 90 08/13/2023   CHOL 158 08/13/2023   TRIG 74 08/13/2023   HDL 78 08/13/2023   LDLDIRECT 102 (H) 10/14/2021   LDLCALC 66 08/13/2023   ALT 13 08/13/2023   AST 14 08/13/2023   NA 138 08/13/2023   K 4.5 08/13/2023   CL 101 08/13/2023   CREATININE 0.73 08/13/2023   BUN 11 08/13/2023   CO2 17 (L) 08/13/2023   TSH 1.420 08/13/2023   HGBA1C 5.8 (H) 08/13/2023    DG Chest 2 View Result Date: 10/14/2023 CLINICAL DATA:  Productive cough for 3 weeks. EXAM: CHEST - 2 VIEW COMPARISON:  02/09/2022. FINDINGS: The heart size and mediastinal contours are within normal limits. There is atherosclerotic calcification of the aorta. No consolidation, effusion, or pneumothorax is seen. Surgical clips are noted over the left breast. There are degenerative changes in the thoracic spine. No acute osseous abnormality. IMPRESSION: No active cardiopulmonary disease. Electronically Signed   By: Leita Birmingham M.D.   On: 10/14/2023 14:58    Assessment & Plan:   Problem  List Items Addressed This Visit     BRONCHITIS   Pulm f/u is pending      Knee pain, chronic - Primary   C/o knee pain - 3 months post R TKR  Hydrocodone  - antsy, Oxy - hallucinations Try Meloxicam  15 mg po prn  Potential benefits of a long term NSAID use as well as potential risks  and complications were explained to the patient and were aknowledged.       Relevant Medications   meloxicam  (MOBIC ) 15 MG tablet   Sarcoidosis   F/u w/Pulmonology      Vitamin D  deficiency   On Vit D         Meds ordered this encounter  Medications   meloxicam  (MOBIC ) 15 MG tablet    Sig: Take 1 tablet (15 mg total) by mouth daily as needed for pain.    Dispense:  30 tablet    Refill:  2      Follow-up: Return in about 3 months (around 01/22/2024) for a follow-up visit.  Marolyn Noel, MD

## 2023-10-22 NOTE — Assessment & Plan Note (Signed)
F/u w/Pulmonology 

## 2023-10-22 NOTE — Assessment & Plan Note (Signed)
Pulm f/u is pending

## 2023-10-22 NOTE — Assessment & Plan Note (Signed)
 C/o knee pain - 3 months post R TKR  Hydrocodone  - antsy, Oxy - hallucinations Try Meloxicam  15 mg po prn  Potential benefits of a long term NSAID use as well as potential risks  and complications were explained to the patient and were aknowledged.

## 2023-10-22 NOTE — Assessment & Plan Note (Signed)
 On Vit D

## 2023-10-30 ENCOUNTER — Ambulatory Visit (HOSPITAL_COMMUNITY): Admission: RE | Admit: 2023-10-30 | Source: Ambulatory Visit

## 2023-11-01 ENCOUNTER — Encounter (HOSPITAL_COMMUNITY): Payer: Self-pay

## 2023-11-02 ENCOUNTER — Ambulatory Visit
Admission: RE | Admit: 2023-11-02 | Discharge: 2023-11-02 | Disposition: A | Discharge status: Home / Self Care | Source: Ambulatory Visit | Attending: Obstetrics and Gynecology | Admitting: Obstetrics and Gynecology

## 2023-11-02 ENCOUNTER — Ambulatory Visit

## 2023-11-02 DIAGNOSIS — Z1231 Encounter for screening mammogram for malignant neoplasm of breast: Secondary | ICD-10-CM

## 2023-11-06 ENCOUNTER — Inpatient Hospital Stay (HOSPITAL_COMMUNITY): Admission: RE | Admit: 2023-11-06 | Source: Ambulatory Visit

## 2023-11-06 ENCOUNTER — Ambulatory Visit (HOSPITAL_COMMUNITY): Admission: RE | Admit: 2023-11-06 | Source: Ambulatory Visit

## 2023-11-07 ENCOUNTER — Other Ambulatory Visit (INDEPENDENT_AMBULATORY_CARE_PROVIDER_SITE_OTHER): Payer: Self-pay | Admitting: Family Medicine

## 2023-11-07 ENCOUNTER — Ambulatory Visit: Admitting: Pulmonary Disease

## 2023-11-07 VITALS — BP 120/75 | HR 75 | Ht 65.0 in | Wt 215.0 lb

## 2023-11-07 DIAGNOSIS — R0602 Shortness of breath: Secondary | ICD-10-CM

## 2023-11-07 DIAGNOSIS — D869 Sarcoidosis, unspecified: Secondary | ICD-10-CM | POA: Diagnosis not present

## 2023-11-07 DIAGNOSIS — R0609 Other forms of dyspnea: Secondary | ICD-10-CM | POA: Diagnosis not present

## 2023-11-07 DIAGNOSIS — Z87891 Personal history of nicotine dependence: Secondary | ICD-10-CM | POA: Diagnosis not present

## 2023-11-07 DIAGNOSIS — E559 Vitamin D deficiency, unspecified: Secondary | ICD-10-CM

## 2023-11-07 MED ORDER — BUDESONIDE-FORMOTEROL FUMARATE 80-4.5 MCG/ACT IN AERO: 2.0000 | INHALATION_SPRAY | Freq: Two times a day (BID) | RESPIRATORY_TRACT | 11 refills | Status: AC

## 2023-11-07 NOTE — Progress Notes (Signed)
 Marilyn [name]    [id number]    1955/03/26  Primary Care Physician:Plotnikov, Karlynn GAILS, MD  Referring Physician: Garald Karlynn GAILS, MD [address] [place],  [place] [address]  Chief complaint:   Cough, shortness of breath  HPI:  History of sarcoidosis diagnosed over 8 years ago  Uses albuterol  as needed Uses Symbicort  2 puffs twice a day  Did have some cough and mucus production that self resolved, likely URI  Recent surgery for which she is participating in rehab S/p right knee replacement  Past Spaete ending weight management  Quit smoking about 20 of years ago  Chronic musculoskeletal pain and discomfort   Outpatient Encounter Medications as of 11/07/2023  Medication Sig   albuterol  (VENTOLIN  HFA) 108 (90 Base) MCG/ACT inhaler Inhale 2 puffs into the lungs every 6 (six) hours as needed for wheezing or shortness of breath.   ALPRAZolam  (XANAX ) 1 MG tablet Take 0.5-1 tablets (0.5-1 mg total) by mouth 3 (three) times daily as needed for anxiety.   benzonatate  (TESSALON ) 100 MG capsule Take 1 capsule (100 mg total) by mouth every 8 (eight) hours.   busPIRone  (BUSPAR ) 30 MG tablet TAKE 1/2 TO 1 (ONE-HALF TO ONE) TABLET BY MOUTH TWICE DAILY (Patient taking differently: Take 15 mg by mouth daily.)   cetirizine  (ZYRTEC ) 10 MG chewable tablet Chew 10 mg by mouth daily.   Cholecalciferol  1.25 MG (50000 UT) TABS 1 po q 7 days   HYDROcodone -acetaminophen  (NORCO/VICODIN) 5-325 MG tablet Take 1-2 tablets by mouth every 6 (six) hours as needed for moderate pain (pain score 4-6).   Menthol , Topical Analgesic, (BIOFREEZE) 10 % CREA Apply 1 application  topically daily as needed (pain).   rosuvastatin  (CRESTOR ) 10 MG tablet Take 1 tablet (10 mg total) by mouth daily.   spironolactone  (ALDACTONE ) 50 MG tablet Take 1 tablet (50 mg total) by mouth daily.   Trolamine Salicylate (BLUE-EMU HEMP EX) Apply 1 Application topically daily as needed (pain).    [DISCONTINUED] budesonide -formoterol  (SYMBICORT ) 80-4.5 MCG/ACT inhaler Inhale 2 puffs into the lungs 2 (two) times daily.   azithromycin  (ZITHROMAX ) 250 MG tablet Take 1 tablet (250 mg total) by mouth daily. Take first 2 tablets together, then 1 every day until finished.   budesonide -formoterol  (SYMBICORT ) 80-4.5 MCG/ACT inhaler Inhale 2 puffs into the lungs 2 (two) times daily.   meloxicam  (MOBIC ) 15 MG tablet Take 1 tablet (15 mg total) by mouth daily as needed for pain.   No facility-administered encounter medications on file as of 11/07/2023.    Allergies as of 11/07/2023 - Review Complete 11/07/2023  Allergen Reaction Noted   Hydrocodone   10/22/2023   Lexapro [escitalopram oxalate]  08/02/2017   Microzide  [hydrochlorothiazide ]  01/31/2021   Oxycodone  hcl  08/09/2023   Penicillins Rash and Other (See Comments)     Past Medical History:  Diagnosis Date   Allergy    Anemia    hx   Anxiety    panic disorder   Breast cancer (HCC) 06/06/2011   bc  left breast 3 o'clock dx=invasive ductal ca uoqER/PR=positive   Cancer (HCC)    BREAST - left   Cigarette smoker    Colon polyp    Complication of anesthesia    DIFFICULTY AWAKENING, BP INCREASE AFTER CHOLECYSTECTEMY    COPD (chronic obstructive pulmonary disease) (HCC)    Depression    Diverticulosis of colon    DJD (degenerative joint disease)    Family history of breast cancer  Family history of colon cancer    Family history of pancreatic cancer    Family history of prostate cancer    Gallstones    GERD (gastroesophageal reflux disease)    Pt. denies having GERD. Unable to remove it.   History of anemia    History of bronchitis    History of sarcoidosis    Hyperlipidemia    no meds needed   Hypertension    Incisional breast wound    AT AGE 63 BILATERAL INCISION TO BREAST MADE   Joint pain    Leg swelling    Panic disorder    Personal history of radiation therapy    Pre-diabetes    S/P radiation therapy 08/17/11 -  09/28/11   LLQ - 50 Gy/25 Fractions with Boost of 10 Gy / 5 fractions   Sialoadenitis of submandibular gland 2016   Vitamin D  deficiency     Past Surgical History:  Procedure Laterality Date   BIOPSY BREAST     left breast  as a teenager  benign   BREAST BIOPSY Left 06/06/2011   BREAST EXCISIONAL BIOPSY Bilateral    BREAST LUMPECTOMY Left 2013   BREAST SURGERY  07/05/11   left breast lumpectomy with needle loc & axillary sln bx   BUNIONECTOMY  2011   bilateral by Dr Lea   CHOLECYSTECTOMY  02/2019   COLONOSCOPY     polyp   cyst removed from right hand  1995   POLYPECTOMY     TONSILLECTOMY  1972   TOTAL ABDOMINAL HYSTERECTOMY  2000   Dr Rosalynn   TOTAL KNEE ARTHROPLASTY Right 07/02/2023   Procedure: ARTHROPLASTY, KNEE, TOTAL;  Surgeon: Jerri Kay HERO, MD;  Location: [facility] OR;  Service: Orthopedics;  Laterality: Right;   TRIGGER FINGER RELEASE  2008   Dr Sissy    Family History  Problem Relation Age of Onset   Obesity Mother    Alcohol abuse Mother    Depression Mother    Hyperlipidemia Mother    Dementia Mother    Hypertension Mother    Anemia Mother    Other Mother        + H Pylori   Seizures Mother    Heart disease Mother    Heart disease Father    Hyperlipidemia Father    Hypertension Father    Prostate cancer Father        dx over 19   Colon cancer Father 3       diagnosed 2009   Bladder Cancer Father        dx over 26   Colon polyps Father    Hypertension Sister    Breast cancer Sister        dx in her 30s; mat half sister   Pancreatic cancer Sister 57       d. 66   Lung cancer Sister    Hypertension Sister    Sarcoidosis Sister        mat 1/2 sister   Multiple sclerosis Brother        #2   Hypertension Brother        #1   Hyperlipidemia Brother    Prostate cancer Brother        dx under 50   Dementia Maternal Aunt    Dementia Maternal Aunt    Seizures Maternal Uncle    Lung cancer Maternal Uncle    Lung cancer Paternal Aunt    Breast cancer  Cousin  pat first cousin d. 58   Sarcoidosis Cousin        mat first cousin   Breast cancer Cousin        maternal 2nd cousins - distant   Brain cancer Other        benign   Esophageal cancer Neg Hx    Stomach cancer Neg Hx    Rectal cancer Neg Hx     Social History   Socioeconomic History   Marital status: Married    Spouse name: Not on file   Number of children: 2   Years of education: Not on file   Highest education level: Bachelor's degree (e.g., BA, AB, BS)  Occupational History   Occupation: retired  Tobacco Use   Smoking status: Former    Current packs/day: 0.00    Types: Cigarettes    Quit date: 12/28/2020    Years since quitting: 2.8   Smokeless tobacco: Never  Vaping Use   Vaping status: Never Used  Substance and Sexual Activity   Alcohol use: Yes    Alcohol/week: 0.0 - 1.0 standard drinks of alcohol    Comment: social   Drug use: Never   Sexual activity: Not Currently    Birth control/protection: Surgical    Comment: menses age 2,hrt started  12/03/1998  Other Topics Concern   Not on file  Social History Narrative   Widowed - husband died 12/03/2003 and 2 step-childrenExercises 3x per week2 cups of caffeine daily   Social Drivers of Corporate investment banker Strain: Low Risk  (08/07/2022)   Overall Financial Resource Strain (CARDIA)    Difficulty of Paying Living Expenses: Not very hard  Food Insecurity: No Food Insecurity (08/07/2022)   Hunger Vital Sign    Worried About Running Out of Food in the Last Year: Never true    Ran Out of Food in the Last Year: Never true  Transportation Needs: No Transportation Needs (08/07/2022)   PRAPARE - Administrator, Civil Service (Medical): No    Lack of Transportation (Non-Medical): No  Physical Activity: Unknown (08/07/2022)   Exercise Vital Sign    Days of Exercise per Week: 0 days    Minutes of Exercise per Session: Not on file  Stress: No Stress Concern Present (08/07/2022)   [employer] of  Occupational Health - Occupational Stress Questionnaire    Feeling of Stress : Not at all  Social Connections: Socially Integrated (08/07/2022)   Social Connection and Isolation Panel    Frequency of Communication with Friends and Family: Three times a week    Frequency of Social Gatherings with Friends and Family: Once a week    Attends Religious Services: More than 4 times per year    Active Member of [employer] or Organizations: Yes    Attends Banker Meetings: 1 to 4 times per year    Marital Status: Married  Catering manager Violence: Not on file    Review of Systems  Respiratory:  Positive for shortness of breath. Negative for cough.     Vitals:   11/07/23 1005  BP: 120/75  Pulse: 75  SpO2: 97%     Physical Exam Constitutional:      Appearance: She is obese.  HENT:     Head: Normocephalic.     Mouth/Throat:     Mouth: Mucous membranes are moist.  Eyes:     General: No scleral icterus.    Pupils: Pupils are equal, round, and reactive to light.  Cardiovascular:     Rate and Rhythm: Normal rate and regular rhythm.     Heart sounds: No murmur heard.    No friction rub.  Pulmonary:     Effort: No respiratory distress.     Breath sounds: No stridor. No wheezing or rhonchi.  Musculoskeletal:     Cervical back: No rigidity or tenderness.  Neurological:     Mental Status: She is alert.  Psychiatric:        Mood and Affect: Mood normal.    Data Reviewed: Dr. Leonides records from 2019 reviewed  Last PFT was in 2017-no obstructive pathology Most recent PFT 03/28/2023 with mild obstructive disease with significant bronchodilator response  No recent CT scan of the chest   Assessment:  History of sarcoidosis - Stable symptoms  Shortness of breath on exertion - Encouraged continuing graded activities as tolerated  Abnormal PFT with mild obstruction with significant bronchodilator response - Encouraged to continue using Symbicort   Obstructive airway  disease based on pulmonary function test with significant bronchodilator response  Plan/Recommendations:  Symbicort  twice a day  Continue albuterol  as needed  Graded activities as tolerated  Weight loss efforts as tolerated  Encouraged to give us  a call with significant concerns  Encouraged to make sure she follows up with eye exams, call us  with any symptoms that may be concerning for sarcoidosis  I spent 30 minutes dedicated to the care of this patient on the date of this encounter to include previsit review of records, face-to-face time with the patient discussing conditions above, post visit ordering of testing,ordering medications,independentlyinterpreting results, clinical documentation with electronic health record and communicated necessary findings to members of the patient's care team  Jennet Epley MD [facility] 11/07/2023, 10:14 AM  CC: Plotnikov, Karlynn GAILS, MD

## 2023-11-07 NOTE — Patient Instructions (Addendum)
 I will see you back in about 6 months  Call us  with significant concerns  Continue using your inhaler  Your breathing study did show significant improvement with inhaler use  Use your Symbicort  2 puffs twice a day  Regular exercises as tolerated

## 2023-11-12 ENCOUNTER — Ambulatory Visit (INDEPENDENT_AMBULATORY_CARE_PROVIDER_SITE_OTHER): Admitting: Family Medicine

## 2023-11-12 ENCOUNTER — Encounter (INDEPENDENT_AMBULATORY_CARE_PROVIDER_SITE_OTHER): Payer: Self-pay | Admitting: Family Medicine

## 2023-11-12 VITALS — BP 115/67 | HR 69 | Temp 98.4°F | Ht 64.5 in | Wt 215.0 lb

## 2023-11-12 DIAGNOSIS — F411 Generalized anxiety disorder: Secondary | ICD-10-CM

## 2023-11-12 DIAGNOSIS — Z6836 Body mass index (BMI) 36.0-36.9, adult: Secondary | ICD-10-CM

## 2023-11-12 DIAGNOSIS — F410 Panic disorder [episodic paroxysmal anxiety] without agoraphobia: Secondary | ICD-10-CM

## 2023-11-12 DIAGNOSIS — I10 Essential (primary) hypertension: Secondary | ICD-10-CM | POA: Diagnosis not present

## 2023-11-12 DIAGNOSIS — Z6837 Body mass index (BMI) 37.0-37.9, adult: Secondary | ICD-10-CM

## 2023-11-12 DIAGNOSIS — R7303 Prediabetes: Secondary | ICD-10-CM

## 2023-11-12 DIAGNOSIS — E559 Vitamin D deficiency, unspecified: Secondary | ICD-10-CM

## 2023-11-12 MED ORDER — CHOLECALCIFEROL 1.25 MG (50000 UT) PO TABS: ORAL_TABLET | ORAL | Status: DC

## 2023-11-12 NOTE — Progress Notes (Signed)
 Marilyn [name], D.O.  ABFM, ABOM Specializing in Clinical Bariatric Medicine  Office located at: [address] [place], [place]  [address]   Assessment and Plan:   Medications Discontinued During This Encounter  Medication Reason   azithromycin  (ZITHROMAX ) 250 MG tablet    benzonatate  (TESSALON ) 100 MG capsule    Cholecalciferol  1.25 MG (50000 UT) TABS Reorder     Meds ordered this encounter  Medications   Cholecalciferol  1.25 MG (50000 UT) TABS    Sig: 1 po q 7 days taking OTC D3     Consider obtaining non-fasting labs next OV   FOR THE DISEASE OF OBESITY:  BMI 37.0-37.9, adult 37.01 current BMI 36.35 Morbid Obesity -- Starting BMI; 37.1 Assessment & Plan: Since last office visit on 10/17/2023 patient's muscle mass has decreased by 0.4 lbs. Fat mass has not changed (104.8 lbs). Total body water has increased by 1.4 lbs.  Body fat % has increased by 0.1 %. Counseling done on how various foods will affect these numbers and how to maximize success  Total lbs lost to date: -4 lbs Total weight loss percentage to date: -1.83 %   Recommended Dietary Goals Shawnise is currently in the action stage of change. As such, her goal is to continue weight management plan.  She has agreed to: continue current plan   Behavioral Intervention We discussed the following today: journal her motivations and why for being a healthier version of herself, exploring Atomic Habits book, working on self-care,  increasing water intake  and work on tracking and journaling calories using tracking application  Additional resources provided today: Handout on Common Characteristics of Successful Weight Losers and Maintainers   Evidence-based interventions for health behavior change were utilized today including the discussion of self monitoring techniques, problem-solving barriers and SMART goal setting techniques.   Regarding patient's less desirable eating habits and patterns, we employed the  technique of small changes.   Goal(s) for next OV: bring in food log + contact cardiology about sleep study referral made on 08/21/2023.    Recommended Physical Activity Goals Teigen has been advised to work up to 300-450 minutes of moderate intensity aerobic activity a week and strengthening exercises 2-3 times per week for cardiovascular health, weight loss maintenance and preservation of muscle mass.   She was encouraged to do some form of exercise 30 minutes daily.   Pharmacotherapy Continue with current nutritional and behavioral strategies   ASSOCIATED CONDITIONS ADDRESSED TODAY:   Prediabetes Assessment & Plan: Lab Results  Component Value Date   HGBA1C 5.8 (H) 08/13/2023   HGBA1C 6.4 02/08/2023   HGBA1C 6.2 02/09/2022   INSULIN  11.3 08/13/2023    Pre-DM managed with dietary and lifestyle interventions. No c/o excessive hunger and cravings today. She will continue working on her nutrition/journaling plan to decrease simple carbohydrates, increase lean proteins and exercise to promote weight loss and improve glycemic control and prevent progression to T2DM.    GAD (generalized anxiety disorder)- w/ panic attacks Assessment & Plan: Overall, pt's anxiety is controlled. On Xanax  prn with last week being the first time she has used it in several weeks. Educated patient that Xanax  can contribute to increased risk of dementia and to continue using it infrequently. She has also been on Buspar  since 2015 and currently takes 15 mg daily. She was advised to take 15 mg twice daily for more effectiveness. Will continue to monitor.     Vitamin D  deficiency Assessment & Plan: Lab Results  Component  Value Date   VD25OH 27.8 (L) 08/13/2023   VD25OH 34.46 07/27/2015   VD25OH 36.92 07/23/2014   On Ergocalciferol  50,000 lU weekly (obtains it through [employer]). No acute concerns. Continue regimen and weight loss efforts. Recheck level as deemed medically necessary.    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 No acute concerns. Cont Spironolactone  50 mg daily. Cont low sodium diet plan. Increase exercise as able.      Follow up:   Return 12/10/2023 at 10:00 AM.  She was informed of the importance of frequent follow up visits to maximize her success with intensive lifestyle modifications for her multiple health conditions.   Subjective:    Chief complaint: Obesity [name] is here to discuss her progress with her obesity treatment plan. She is on the Category 1 Plan with [employer] and states she is following her eating plan approximately 70% of the time. Pt is using the stationary bike 30 minutes 3 days per week   Interval History:  Marilyn [name] is here for a follow up office visit.    Pt's wt has not changed since last OV on 10/17/2023.    Dietary habits include:  - Tracking Calories/Macros: not currently tracking; feels she is at a plateau and has not had the motivation to track.  - Eating More Whole Foods: yes, in fact she is grilling and cooking more at home.  - Adequate Protein Intake: yes  - Adequate Water Intake: no  - Skipping Meals: yes  - Sleeping 7-9 Hours/ Night:  no   She mentions never being contacted regarding the sleep study referral Dr.[place] made on 08/21/2023.   Pharmacotherapy that aid with weight loss: none   Review of Systems:  Pertinent positives were addressed with patient today.  Reviewed by clinician on day of visit: allergies, medications, problem list, medical history, surgical history, family history, social history, and previous encounter  notes.  Weight Summary and Biometrics   Weight Lost Since Last Visit: 0  Weight Gained Since Last Visit: 0  Vitals Temp: 98.4 F (36.9 C) BP: 115/67 Pulse Rate: 69 SpO2: 98 %   Anthropometric Measurements Height: 5' 4.5 (1.638 m) Weight: 215 lb (97.5 kg) BMI (Calculated): 36.35 Weight at Last Visit: 215lb Weight Lost Since Last Visit: 0 Weight Gained Since Last Visit: 0 Starting Weight: 219lb Total Weight Loss (lbs): 4 lb (1.814 kg) Peak Weight: 227lb   Body Composition  Body Fat %: 48.7 % Fat Mass (lbs): 104.8 lbs Muscle Mass (lbs): 105 lbs Total Body Water (lbs): 80.2 lbs Visceral Fat Rating : 15   Other Clinical Data Fasting: yes Labs: no Today's Visit #: 5 Starting Date: 08/13/23    Objective:   PHYSICAL EXAM: Blood pressure 115/67, pulse 69, temperature 98.4 F (36.9 C), height 5' 4.5 (1.638 m), weight 215 lb (97.5 kg), SpO2 98%. Body mass index is 36.33 kg/m.  General: she is overweight, cooperative and in no acute distress. PSYCH: Has normal mood, affect and thought process.   HEENT: EOMI, sclerae are anicteric. Lungs: Normal breathing effort, no conversational dyspnea. Extremities: Moves * 4 Neurologic: A and O * 3, good  insight  DIAGNOSTIC DATA REVIEWED: BMET    Component Value Date/Time   NA 138 08/13/2023 1055   NA 138 11/04/2013 0929   K 4.5 08/13/2023 1055   K 3.8 11/04/2013 0929   CL 101 08/13/2023 1055   CL 107 03/22/2012 0853   CO2 17 (L) 08/13/2023 1055   CO2 23 11/04/2013 0929   GLUCOSE 90 08/13/2023 1055   GLUCOSE 89 06/22/2023 1400   GLUCOSE 100 11/04/2013 0929   GLUCOSE 99 03/22/2012 0853   BUN 11 08/13/2023 1055   BUN 12.1 11/04/2013 0929   CREATININE 0.73 08/13/2023 1055   CREATININE 0.8 11/04/2013 0929   CALCIUM  10.1 08/13/2023 1055   CALCIUM  9.6 11/04/2013 0929   GFRNONAA >60 06/22/2023 1400   GFRAA >60 07/14/2016 2305   Lab Results  Component Value Date   HGBA1C 5.8 (H) 08/13/2023   HGBA1C 6.2  02/09/2022   Lab Results  Component Value Date   INSULIN  11.3 08/13/2023   Lab Results  Component Value Date   TSH 1.420 08/13/2023   CBC    Component Value Date/Time   WBC 8.9 08/13/2023 1055   WBC 10.2 06/22/2023 1400   RBC 5.12 08/13/2023 1055   RBC 5.32 (H) 06/22/2023 1400   HGB 11.9 08/13/2023 1055   HGB 12.2 11/04/2013 0928   HCT 40.1 08/13/2023 1055   HCT 38.9 11/04/2013 0928   PLT 283 08/13/2023 1055   MCV 78 (L) 08/13/2023 1055   MCV 74.7 (L) 11/04/2013 0928   MCH 23.2 (L) 08/13/2023 1055   MCH 23.5 (L) 06/22/2023 1400   MCHC 29.7 (L) 08/13/2023 1055   MCHC 31.0 06/22/2023 1400   RDW 14.4 08/13/2023 1055   RDW 14.7 (H) 11/04/2013 0928   Iron Studies    Component Value Date/Time   IRON 74 12/25/2019 1415   TIBC 319 12/25/2019 1415   FERRITIN 110 12/25/2019 1415   IRONPCTSAT 23 12/25/2019 1415   Lipid Panel     Component Value Date/Time   CHOL 158 08/13/2023 1055   TRIG 74 08/13/2023 1055   HDL 78 08/13/2023 1055   CHOLHDL 2.0 08/13/2023 1055   CHOLHDL 3 02/08/2023 1127   VLDL 14.6 02/08/2023 1127   LDLCALC 66 08/13/2023 1055   LDLDIRECT 102 (H) 10/14/2021 1326   LDLDIRECT 118.9 12/18/2011 1138   Hepatic Function Panel     Component Value Date/Time   PROT 7.4 08/13/2023 1055   PROT 7.2 11/04/2013 0929   ALBUMIN 4.5 08/13/2023 1055   ALBUMIN 3.6 11/04/2013 0929   AST 14 08/13/2023 1055   AST 12 11/04/2013 0929   ALT 13 08/13/2023 1055   ALT 13 11/04/2013 0929   ALKPHOS 104 08/13/2023 1055   ALKPHOS 108 11/04/2013 0929   BILITOT 0.4 08/13/2023 1055   BILITOT 0.28 11/04/2013 0929   BILIDIR 0.0 11/12/2018 0912      Component Value Date/Time   TSH 1.420 08/13/2023 1055   Nutritional Lab Results  Component Value Date   VD25OH 27.8 (L) 08/13/2023   VD25OH 34.46 07/27/2015   VD25OH 36.92 07/23/2014    Attestations:   I, Special Puri, acting as a Stage manager for Marilyn Jenkins, DO., have compiled all relevant documentation for  today's office visit on behalf of Marilyn Jenkins, DO, while in the presence of [employer], DO.  I have reviewed the above documentation for accuracy and completeness, and I agree with the above. Marilyn [name], D.O.  The 21st Century Cures Act was signed into law in  2016 which includes the topic of electronic health records.  This provides immediate access to information in MyChart.  This includes consultation notes, operative notes, office notes, lab results and pathology reports.  If you have any questions about what you read please let us  know at your next visit so we can discuss your concerns and take corrective action if need be.  We are right here with you.

## 2023-11-13 ENCOUNTER — Telehealth: Payer: Self-pay | Admitting: Cardiovascular Disease

## 2023-11-13 DIAGNOSIS — I10 Essential (primary) hypertension: Secondary | ICD-10-CM

## 2023-11-13 DIAGNOSIS — Z6837 Body mass index (BMI) 37.0-37.9, adult: Secondary | ICD-10-CM

## 2023-11-13 DIAGNOSIS — G4730 Sleep apnea, unspecified: Secondary | ICD-10-CM

## 2023-11-13 NOTE — Telephone Encounter (Signed)
 Pt called in about sleep study. She has an order for npsg but there is no auth yet, please advise.

## 2023-11-13 NOTE — Telephone Encounter (Signed)
**Note De-Identified Zainah Steven Obfuscation** I called [facility] and started a NPSG PA with Sindy R. Transaction #: [phone]  Per Norvel, I have faxed all clinical notes to them at [phone] and I did receive confirmation that the fax sent successfully.

## 2023-11-27 NOTE — Telephone Encounter (Signed)
**Note De-Identified Zayn Selley Obfuscation** I received a call from [place] from [facility] stating that they have denied the pts NPSG because she has no co morbidities and has had no prior sleep testing.  Per [place], I called [facility] to ask which sleep study they will cover and per Habiba C., a Home Sleep Study Type 3 is covered.  I am forwarding this note to Dr Raford for her recommendation.

## 2023-12-10 ENCOUNTER — Encounter (HOSPITAL_COMMUNITY): Payer: Self-pay

## 2023-12-10 ENCOUNTER — Encounter (INDEPENDENT_AMBULATORY_CARE_PROVIDER_SITE_OTHER): Payer: Self-pay | Admitting: Family Medicine

## 2023-12-10 ENCOUNTER — Ambulatory Visit (INDEPENDENT_AMBULATORY_CARE_PROVIDER_SITE_OTHER): Admitting: Family Medicine

## 2023-12-10 VITALS — BP 126/79 | HR 84 | Temp 98.2°F | Ht 64.5 in | Wt 217.0 lb

## 2023-12-10 DIAGNOSIS — R7303 Prediabetes: Secondary | ICD-10-CM | POA: Diagnosis not present

## 2023-12-10 DIAGNOSIS — Z6836 Body mass index (BMI) 36.0-36.9, adult: Secondary | ICD-10-CM

## 2023-12-10 DIAGNOSIS — I10 Essential (primary) hypertension: Secondary | ICD-10-CM

## 2023-12-10 DIAGNOSIS — E559 Vitamin D deficiency, unspecified: Secondary | ICD-10-CM

## 2023-12-10 MED ORDER — CHOLECALCIFEROL 1.25 MG (50000 UT) PO TABS: ORAL_TABLET | ORAL | Status: DC

## 2023-12-10 NOTE — Progress Notes (Signed)
 Marilyn DOROTHA [name], D.O.  ABFM, ABOM Specializing in Clinical Bariatric Medicine  Office located at: [address] [place], [place]  [address]      A) FOR THE CHRONIC DISEASE OF OBESITY:  Chief complaint: Obesity Marilyn [name] is here to discuss her progress with her obesity treatment plan.   History of present illness / Interval history:  Marilyn [name] is here today for her follow-up office visit.  Since last OV on 11/12/2023, pt is up 2 lbs.    11/12/23 10:00 12/10/23 09:00   Body Fat % 48.7 % 48.9 %  Muscle Mass (lbs) 105 lbs 105.2 lbs  Fat Mass (lbs) 104.8 lbs 105.2 lbs  Total Body Water (lbs) 80.2 lbs 79.8 lbs  Visceral Fat Rating  15 15   Counseling done on how various foods will affect these numbers and how to maximize success   Total lbs lost to date: -2 lbs Total Fat Mass in lbs lost to date: -7.2 Total weight loss percentage to date: -0.91 %   Nutrition Therapy She is on the Category 1 Plan with lunch options and states she is following her eating plan approximately 80 % of the time.     - Tracking Calories/Macros: yes; tr  - Eating More Whole Foods: yes  - Adequate Protein Intake: yes  - Adequate Water Intake: no -    - Skipping Meals: yes  - Sleeping 7-9 Hours/ Night: no - has been helping with her grandchild, so has been struggling to get adequate sleep.  Followed up with cardiology about sleep study, but has not been contacted regarding it. Has not been approved for sleep study. Educated that it can be challenging to lose weight without adequate sleep.    Marilyn [name] is currently in the action stage of change. As such, her goal is to continue weight management plan.  She has agreed to: continue current plan and journal her intake.   Physical Activity Is going [facility]  minutes 3 days per week, but didn't go last week.   Pt reports that she has been exercising recently, but has not been focused on strength training  because her shoulders and knees have been in more pain. Is  usually not on  hydrocodone -acetaminophen , but had to take it recently to help with the pain.    Marilyn [name] has been advised to work up to 300-450 minutes of moderate intensity aerobic activity a week and strengthening exercises 2-3 times per week for cardiovascular health, weight loss maintenance and preservation of muscle mass.  She has agreed to : Think about enjoyable ways to increase daily physical activity and overcoming barriers to exercise, Increase physical activity in their day and reduce sedentary time (increase NEAT)., Increase volume of physical activity to a goal of 240 minutes a week, and Combine aerobic and strengthening exercises for efficiency and improved cardiometabolic health.   Behavioral Modifications Evidence-based interventions for health behavior change were utilized today including the discussion of  1) self monitoring techniques:  none 2) problem-solving barriers:  none 3) self care:  none 4) SMART goals for next OV:  Journal more Regarding patient's less desirable eating habits and patterns, we employed the technique of small changes.   We discussed the following today: increasing lean protein intake to established goals, decreasing simple carbohydrates , increasing water intake , work on meal planning and preparation, work on tracking and journaling calories using tracking application, and continue to practice mindfulness when eating Additional resources  provided today: Handout on Daily Food Journaling Log   Medical Interventions/ Pharmacotherapy Previous Bariatric surgery: no Pharmacotherapy for weight loss: She is not currently taking medications  for medical weight loss.    We discussed various medication options to help Marilyn [name] with her weight loss efforts and we both agreed to : Continue with current nutritional and behavioral strategies   B) OBESITY RELATED CONDITIONS ADDRESSED TODAY:  BMI  36.0-36.9,adult current BMI 36.7 Morbid obesity (HCC) starting BMI 37.01    Prediabetes Assessment & Plan Lab Results  Component Value Date   HGBA1C 5.8 (H) 08/13/2023   HGBA1C 6.4 02/08/2023   HGBA1C 6.2 02/09/2022   INSULIN  11.3 08/13/2023  Managed by diet and lifestyle interventions. Hunger and cravings well controlled. A1c is slightly above goal at 5.8, optimal <5.7 Recheck A1c and insulin  levels today. No acute concerns. Was encouraged to journal and continue decreasing simple carbohydrates, increase lean proteins, and exercise as able to promote weight loss    Hypertension, unspecified type with aortic arthrosclerosis (12/22) Assessment & Plan BP Readings from Last 3 Encounters:  12/10/23 126/79  11/12/23 115/67  11/07/23 120/75   The 10-year ASCVD risk score (Arnett DK, et al., 2019) is: 9%  Lab Results  Component Value Date   CREATININE 0.73 08/13/2023  Currently on Spironolactone  50 mg daily with good compliance and tolerance. Blood pressure is at goal today. Pt asx. No acute concerns today. Continue to follow up with cardiologist. Continue a low sodium diet able and increase exercise as able.     Vitamin D  deficiency Assessment & Plan Lab Results  Component Value Date   VD25OH 27.8 (L) 08/13/2023   VD25OH 34.46 07/27/2015   VD25OH 36.92 07/23/2014  On OTC Vitamin D  50,000 units once weekly with good compliance and tolerance. Vit D levels below goal, at 27.8; optimal 50-70. No acute concerns today. Continue regimen (refill today) and recheck levels today.      Medications Discontinued During This Encounter  Medication Reason   Cholecalciferol  1.25 MG (50000 UT) TABS Reorder     Meds ordered this encounter  Medications   Cholecalciferol  1.25 MG (50000 UT) TABS    Sig: 1 po q 7 days taking OTC D3      Follow up:   Return 01/07/2024 10:20 AM.  She was informed of the importance of frequent follow up visits to maximize her success with intensive  lifestyle modifications for her multiple health conditions.  Marilyn [name] is aware that we will review all of her lab results at our next visit together in person.  She is aware that if anything is critical/ life threatening with the results, we will be contacting her via MyChart or by my CMA will be calling them prior to the office visit to discuss acute management.      Weight Summary and Biometrics   Weight Lost Since Last Visit: 0lb  Weight Gained Since Last Visit: 2lb    Vitals Temp: 98.2 F (36.8 C) BP: 126/79 Pulse Rate: 84 SpO2: 100 %   Anthropometric Measurements Height: 5' 4.5 (1.638 m) Weight: 217 lb (98.4 kg) BMI (Calculated): 36.69 Weight at Last Visit: 215lb Weight Lost Since Last Visit: 0lb Weight Gained Since Last Visit: 2lb Starting Weight: 219lb Total Weight Loss (lbs): 2 lb (0.907 kg) Peak Weight: 227lb   Body Composition  Body Fat %: 48.9 % Fat Mass (lbs): 105.2 lbs Muscle Mass (lbs): 105.2 lbs Total Body Water (lbs): 79.8 lbs Visceral Fat Rating : 15  Other Clinical Data Fasting: Yes Labs: No Today's Visit #: 6 Starting Date: 08/13/23    Objective:   PHYSICAL EXAM: Blood pressure 126/79, pulse 84, temperature 98.2 F (36.8 C), height 5' 4.5 (1.638 m), weight 217 lb (98.4 kg), SpO2 100%. Body mass index is 36.67 kg/m.  General: she is overweight, cooperative and in no acute distress. PSYCH: Has normal mood, affect and thought process.   HEENT: EOMI, sclerae are anicteric. Lungs: Normal breathing effort, no conversational dyspnea. Extremities: Moves * 4 Neurologic: A and O * 3, good insight  DIAGNOSTIC DATA REVIEWED: BMET    Component Value Date/Time   NA 138 08/13/2023 1055   NA 138 11/04/2013 0929   K 4.5 08/13/2023 1055   K 3.8 11/04/2013 0929   CL 101 08/13/2023 1055   CL 107 03/22/2012 0853   CO2 17 (L) 08/13/2023 1055   CO2 23 11/04/2013 0929   GLUCOSE 90 08/13/2023 1055   GLUCOSE 89 06/22/2023 1400    GLUCOSE 100 11/04/2013 0929   GLUCOSE 99 03/22/2012 0853   BUN 11 08/13/2023 1055   BUN 12.1 11/04/2013 0929   CREATININE 0.73 08/13/2023 1055   CREATININE 0.8 11/04/2013 0929   CALCIUM  10.1 08/13/2023 1055   CALCIUM  9.6 11/04/2013 0929   GFRNONAA >60 06/22/2023 1400   GFRAA >60 07/14/2016 2305   Lab Results  Component Value Date   HGBA1C 5.8 (H) 08/13/2023   HGBA1C 6.2 02/09/2022   Lab Results  Component Value Date   INSULIN  11.3 08/13/2023   Lab Results  Component Value Date   TSH 1.420 08/13/2023   CBC    Component Value Date/Time   WBC 8.9 08/13/2023 1055   WBC 10.2 06/22/2023 1400   RBC 5.12 08/13/2023 1055   RBC 5.32 (H) 06/22/2023 1400   HGB 11.9 08/13/2023 1055   HGB 12.2 11/04/2013 0928   HCT 40.1 08/13/2023 1055   HCT 38.9 11/04/2013 0928   PLT 283 08/13/2023 1055   MCV 78 (L) 08/13/2023 1055   MCV 74.7 (L) 11/04/2013 0928   MCH 23.2 (L) 08/13/2023 1055   MCH 23.5 (L) 06/22/2023 1400   MCHC 29.7 (L) 08/13/2023 1055   MCHC 31.0 06/22/2023 1400   RDW 14.4 08/13/2023 1055   RDW 14.7 (H) 11/04/2013 0928   Iron Studies    Component Value Date/Time   IRON 74 12/25/2019 1415   TIBC 319 12/25/2019 1415   FERRITIN 110 12/25/2019 1415   IRONPCTSAT 23 12/25/2019 1415   Lipid Panel     Component Value Date/Time   CHOL 158 08/13/2023 1055   TRIG 74 08/13/2023 1055   HDL 78 08/13/2023 1055   CHOLHDL 2.0 08/13/2023 1055   CHOLHDL 3 02/08/2023 1127   VLDL 14.6 02/08/2023 1127   LDLCALC 66 08/13/2023 1055   LDLDIRECT 102 (H) 10/14/2021 1326   LDLDIRECT 118.9 12/18/2011 1138   Hepatic Function Panel     Component Value Date/Time   PROT 7.4 08/13/2023 1055   PROT 7.2 11/04/2013 0929   ALBUMIN 4.5 08/13/2023 1055   ALBUMIN 3.6 11/04/2013 0929   AST 14 08/13/2023 1055   AST 12 11/04/2013 0929   ALT 13 08/13/2023 1055   ALT 13 11/04/2013 0929   ALKPHOS 104 08/13/2023 1055   ALKPHOS 108 11/04/2013 0929   BILITOT 0.4 08/13/2023 1055   BILITOT 0.28  11/04/2013 0929   BILIDIR 0.0 11/12/2018 0912      Component Value Date/Time   TSH 1.420 08/13/2023 1055   Nutritional Lab  Results  Component Value Date   VD25OH 27.8 (L) 08/13/2023   VD25OH 34.46 07/27/2015   VD25OH 36.92 07/23/2014    Attestations:   Marilyn [name], acting as a medical scribe for Marilyn Jenkins, Marilyn [name]., have compiled all relevant documentation for today's office visit on behalf of Marilyn Jenkins, Marilyn [name], while in the presence of [employer], Marilyn [name].    I have reviewed the above documentation for accuracy and completeness, and I agree with the above. Marilyn [name], D.O.  The 21st Century Cures Act was signed into law in 2016 which includes the topic of electronic health records.  This provides immediate access to information in MyChart.  This includes consultation notes, operative notes, office notes, lab results and pathology reports.  If you have any questions about what you read please let us  know at your next visit so we can discuss your concerns and take corrective action if need be.  We are right here with you.

## 2023-12-11 ENCOUNTER — Telehealth (HOSPITAL_COMMUNITY): Payer: Self-pay | Admitting: Emergency Medicine

## 2023-12-11 LAB — INSULIN, RANDOM: INSULIN: 14.9 u[IU]/mL (ref 2.6–24.9)

## 2023-12-11 LAB — VITAMIN D 25 HYDROXY (VIT D DEFICIENCY, FRACTURES): Vit D, 25-Hydroxy: 66.8 ng/mL (ref 30.0–100.0)

## 2023-12-11 LAB — HEMOGLOBIN A1C
Est. average glucose Bld gHb Est-mCnc: 128 mg/dL
Hgb A1c MFr Bld: 6.1 % — ABNORMAL HIGH (ref 4.8–5.6)

## 2023-12-11 NOTE — Telephone Encounter (Signed)
 Reaching out to patient to offer assistance regarding upcoming cardiac imaging study; pt verbalizes understanding of appt date/time, parking situation and where to check in, pre-test NPO status and medications ordered, and verified current allergies; name and call back number provided for further questions should they arise Rockwell Alexandria RN Navigator Cardiac Imaging [facility] [phone] office [phone] cell

## 2023-12-12 ENCOUNTER — Ambulatory Visit (HOSPITAL_COMMUNITY)
Admission: RE | Admit: 2023-12-12 | Discharge: 2023-12-12 | Disposition: A | Discharge status: Home / Self Care | Source: Ambulatory Visit | Attending: Cardiovascular Disease | Admitting: Cardiovascular Disease

## 2023-12-12 DIAGNOSIS — I10 Essential (primary) hypertension: Secondary | ICD-10-CM | POA: Diagnosis not present

## 2023-12-12 DIAGNOSIS — I2510 Atherosclerotic heart disease of native coronary artery without angina pectoris: Secondary | ICD-10-CM

## 2023-12-12 LAB — NM PET CT CARDIAC PERFUSION MULTI W/ABSOLUTE BLOODFLOW
MBFR: 2.78
Nuc Rest EF: 72 %
Nuc Stress EF: 75 %
Rest MBF: 1.09 ml/g/min
Rest Nuclear Isotope Dose: 25.7 mCi
Stress MBF: 3.03 ml/g/min
Stress Nuclear Isotope Dose: 25.3 mCi
TID: 1.14

## 2023-12-12 MED ORDER — RUBIDIUM RB82 GENERATOR (RUBYFILL)
25.7000 | PACK | Freq: Once | INTRAVENOUS | Status: AC
  Administered 2023-12-12: 25.7 via INTRAVENOUS

## 2023-12-12 MED ORDER — RUBIDIUM RB82 GENERATOR (RUBYFILL)
25.3000 | PACK | Freq: Once | INTRAVENOUS | Status: AC
  Administered 2023-12-12: 25.3 via INTRAVENOUS

## 2023-12-12 MED ORDER — REGADENOSON 0.4 MG/5ML IV SOLN
INTRAVENOUS | Status: AC
  Filled 2023-12-12: qty 5

## 2023-12-12 MED ORDER — REGADENOSON 0.4 MG/5ML IV SOLN
0.4000 mg | Freq: Once | INTRAVENOUS | Status: AC
Start: 2023-12-12 — End: 2023-12-12
  Administered 2023-12-12: 0.4 mg via INTRAVENOUS

## 2023-12-13 ENCOUNTER — Other Ambulatory Visit (INDEPENDENT_AMBULATORY_CARE_PROVIDER_SITE_OTHER): Payer: Self-pay

## 2023-12-13 ENCOUNTER — Other Ambulatory Visit: Payer: Self-pay

## 2023-12-13 ENCOUNTER — Ambulatory Visit: Admitting: Orthopaedic Surgery

## 2023-12-13 DIAGNOSIS — M25562 Pain in left knee: Secondary | ICD-10-CM | POA: Diagnosis not present

## 2023-12-13 DIAGNOSIS — G8929 Other chronic pain: Secondary | ICD-10-CM

## 2023-12-13 DIAGNOSIS — Z96651 Presence of right artificial knee joint: Secondary | ICD-10-CM

## 2023-12-13 NOTE — Progress Notes (Signed)
 Office Visit Note   Patient: Marilyn [name]           Date of Birth: 02-Mar-1955           MRN: [id number] Visit Date: 12/13/2023              Requested by: Garald Karlynn GAILS, MD [address] [place],  [place] [address] PCP: Plotnikov, Karlynn GAILS, MD   Assessment & Plan: Visit Diagnoses:  1. Status post total right knee replacement   2. Chronic pain of left knee     Plan: History of Present Illness Marilyn [name] is a 68 year old female who presents for follow-up after right knee replacement surgery and follow up evaluation of left knee osteoarthritis.  She is five months post-surgery and generally satisfied with the outcome. Hip pain has resolved, but she has difficulty alternating legs on stairs and experiences significant stiffness in both legs, particularly at night. She reports a 'big pain' when moving her legs in bed, which is alleviated by using a recliner. Morning stiffness is present and takes time to resolve.  She is concerned about her arthritic left knee and is interested in avoiding another knee replacement. She is participating in a healthy weight program to lose weight and strengthen her quadriceps to manage her knee condition.  She experiences numbness, which may be permanent due to nerve involvement during the surgery. Some numbness might improve over time, but some is expected to remain.  Exam of the right knee shows normal range of motion.  Fully healed surgical scar.  Collaterals are stable. Exam of the left knee shows normal range of motion.  Medial joint line tenderness.  Assessment and Plan Status post right total knee arthroplasty Five months post-surgery with occasional stiffness and soreness, especially at night. X-rays show well-positioned components. Muscle strengthening ongoing, not at maximum medical improvement. - Recommend supportive shoes such as [place] or [employer]. - Encourage treadmill and home gym exercises. - Advise to notify  before dental cleaning for antibiotic prophylaxis for two years post-surgery. - Schedule follow-up in seven months for one-year post-operative visit. - repeat xrays on return  Left knee osteoarthritis Significant osteoarthritis with bone-on-bone changes. Discussed weight loss and quadriceps strengthening to avoid knee replacement and reduce knee stress. - Continue participation in healthy weight program. - Engage in exercises focusing on quadriceps strengthening.  Follow-Up Instructions: Return in about 7 months (around 07/12/2024).   Orders:  Orders Placed This Encounter  Procedures   XR Knee 1-2 Views Right   XR KNEE 3 VIEW LEFT   No orders of the defined types were placed in this encounter.     Procedures: No procedures performed   Clinical Data: No additional findings.   Subjective: Chief Complaint  Patient presents with   Right Knee - Follow-up, Pain   Left Knee - Pain, Edema    HPI  Review of Systems  Constitutional: Negative.   HENT: Negative.    Eyes: Negative.   Respiratory: Negative.    Cardiovascular: Negative.   Endocrine: Negative.   Musculoskeletal: Negative.   Neurological: Negative.   Hematological: Negative.   Psychiatric/Behavioral: Negative.    All other systems reviewed and are negative.    Objective: Vital Signs: There were no vitals taken for this visit.  Physical Exam Vitals and nursing note reviewed.  Constitutional:      Appearance: She is well-developed.  HENT:     Head: Atraumatic.     Nose: Nose normal.  Eyes:  Extraocular Movements: Extraocular movements intact.  Cardiovascular:     Pulses: Normal pulses.  Pulmonary:     Effort: Pulmonary effort is normal.  Abdominal:     Palpations: Abdomen is soft.  Musculoskeletal:     Cervical back: Neck supple.  Skin:    General: Skin is warm.     Capillary Refill: Capillary refill takes less than 2 seconds.  Neurological:     Mental Status: She is alert. Mental status is  at baseline.  Psychiatric:        Behavior: Behavior normal.        Thought Content: Thought content normal.        Judgment: Judgment normal.     Ortho Exam  Specialty Comments:  No specialty comments available.  Imaging: XR Knee 1-2 Views Right Result Date: 12/13/2023 X-rays of the right knee demonstrate a stable right total knee replacement in good alignment   XR KNEE 3 VIEW LEFT Result Date: 12/13/2023 X-rays of the left knee show advanced tricompartmental osteoarthritis.  Bone-on-bone joint space narrowing.  Kellgren-Lawrence stage IV    PMFS History: Patient Active Problem List   Diagnosis Date Noted   Hypertension 09/17/2023   Visceral obesity 09/17/2023   Prediabetes- new onset 08/27/2023   Mixed hyperlipidemia 08/27/2023   Iron deficiency 08/27/2023   Combined forms of age-related cataract of both eyes 08/09/2023   PVD (posterior vitreous detachment), left 08/09/2023   Vitreous floaters of both eyes 08/09/2023   Status post total right knee replacement 07/02/2023   Primary osteoarthritis of right knee 07/01/2023   Hypercholesterolemia 05/10/2022   Trochanteric bursitis of right hip 05/10/2022   Preop exam for internal medicine 02/09/2022   Plantar flexed metatarsal bone of right foot 01/11/2022   Grief 09/28/2021   Leg swelling 04/18/2021   Incisional breast wound 04/18/2021   History of sarcoidosis 04/18/2021   History of bronchitis 04/18/2021   GERD (gastroesophageal reflux disease) 04/18/2021   Family history of prostate cancer 04/18/2021   Depression 04/18/2021   Complication of anesthesia 04/18/2021   Colon polyp 04/18/2021   Cigarette smoker 04/18/2021   Cancer (HCC) 04/18/2021   Coronary atherosclerosis 01/30/2021   Aortic atherosclerosis 01/30/2021   Primary hypertension 01/14/2021   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   Knee pain, chronic 08/28/2019   Elevated BP without diagnosis of hypertension 08/28/2019   Callus of foot 08/28/2019    Cholelithiasis 12/26/2018   Genetic testing 12/26/2017   Family history of breast cancer    Family history of pancreatic cancer    Family history of colon cancer    Morbid obesity (HCC) 03/13/2016   Dyspnea 11/09/2015   Rash and nonspecific skin eruption 07/27/2015   Well adult exam 07/23/2014   Sialoadenitis 06/02/2014   Edema 07/07/2013   AB (asthmatic bronchitis) 06/23/2013   Anxiety    Malignant neoplasm of upper-outer quadrant of left breast in female, 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   Allergy 06/06/2011   COLONIC POLYPS 01/25/2010   DIVERTICULOSIS OF COLON 01/25/2010   DEGENERATIVE JOINT DISEASE 11/06/2008   PANIC DISORDER 07/23/2008   Sarcoidosis 10/13/2007   Vitamin D  deficiency 10/13/2007   ANEMIA 10/13/2007   BRONCHITIS 10/13/2007   Dyslipidemia 04/23/2007   Adjustment disorder with mixed anxiety and depressed mood 04/23/2007   Venous (peripheral) insufficiency 04/23/2007   HEADACHE 04/23/2007   Past Medical History:  Diagnosis Date   Allergy    Anemia    hx   Anxiety    panic disorder   Breast  cancer (HCC) 06/06/2011   bc  left breast 3 o'clock dx=invasive ductal ca uoqER/PR=positive   Cancer (HCC)    BREAST - left   Cigarette smoker    Colon polyp    Complication of anesthesia    DIFFICULTY AWAKENING, BP INCREASE AFTER CHOLECYSTECTEMY    COPD (chronic obstructive pulmonary disease) (HCC)    Depression    Diverticulosis of colon    DJD (degenerative joint disease)    Family history of breast cancer    Family history of colon cancer    Family history of pancreatic cancer    Family history of prostate cancer    Gallstones    GERD (gastroesophageal reflux disease)    Pt. denies having GERD. Unable to remove it.   History of anemia    History of bronchitis    History of sarcoidosis    Hyperlipidemia    no meds needed   Hypertension    Incisional breast wound    AT AGE 73 BILATERAL INCISION TO BREAST MADE   Joint pain    Leg swelling     Panic disorder    Personal history of radiation therapy    Pre-diabetes    S/P radiation therapy 08/17/11 - 09/28/11   LLQ - 50 Gy/25 Fractions with Boost of 10 Gy / 5 fractions   Sialoadenitis of submandibular gland 2016   Vitamin D  deficiency     Family History  Problem Relation Age of Onset   Obesity Mother    Alcohol abuse Mother    Depression Mother    Hyperlipidemia Mother    Dementia Mother    Hypertension Mother    Anemia Mother    Other Mother        + H Pylori   Seizures Mother    Heart disease Mother    Heart disease Father    Hyperlipidemia Father    Hypertension Father    Prostate cancer Father        dx over 66   Colon cancer Father 47       diagnosed 2009   Bladder Cancer Father        dx over 56   Colon polyps Father    Hypertension Sister    Breast cancer Sister        dx in her 30s; mat half sister   Pancreatic cancer Sister 16       d. 79   Lung cancer Sister    Hypertension Sister    Sarcoidosis Sister        mat 1/2 sister   Multiple sclerosis Brother        #2   Hypertension Brother        #1   Hyperlipidemia Brother    Prostate cancer Brother        dx under 50   Dementia Maternal Aunt    Dementia Maternal Aunt    Seizures Maternal Uncle    Lung cancer Maternal Uncle    Lung cancer Paternal Aunt    Breast cancer Cousin        pat first cousin d. 93   Sarcoidosis Cousin        mat first cousin   Breast cancer Cousin        maternal 2nd cousins - distant   Brain cancer Other        benign   Esophageal cancer Neg Hx    Stomach cancer Neg Hx    Rectal cancer Neg Hx  Past Surgical History:  Procedure Laterality Date   BIOPSY BREAST     left breast  as a teenager  benign   BREAST BIOPSY Left 06/06/2011   BREAST EXCISIONAL BIOPSY Bilateral    BREAST LUMPECTOMY Left 2013   BREAST SURGERY  07/05/11   left breast lumpectomy with needle loc & axillary sln bx   BUNIONECTOMY  2011   bilateral by Dr Lea   CHOLECYSTECTOMY   02/2019   COLONOSCOPY     polyp   cyst removed from right hand  1995   POLYPECTOMY     TONSILLECTOMY  1972   TOTAL ABDOMINAL HYSTERECTOMY  2000   Dr Rosalynn   TOTAL KNEE ARTHROPLASTY Right 07/02/2023   Procedure: ARTHROPLASTY, KNEE, TOTAL;  Surgeon: Jerri Kay HERO, MD;  Location: [facility] OR;  Service: Orthopedics;  Laterality: Right;   TRIGGER FINGER RELEASE  2008   Dr Sissy   Social History   Occupational History   Occupation: retired  Tobacco Use   Smoking status: Former    Current packs/day: 0.00    Types: Cigarettes    Quit date: 12/28/2020    Years since quitting: 2.9   Smokeless tobacco: Never  Vaping Use   Vaping status: Never Used  Substance and Sexual Activity   Alcohol use: Yes    Alcohol/week: 0.0 - 1.0 standard drinks of alcohol    Comment: social   Drug use: Never   Sexual activity: Not Currently    Birth control/protection: Surgical    Comment: menses age 59,hrt started  2000

## 2023-12-19 ENCOUNTER — Ambulatory Visit: Payer: Self-pay | Admitting: Cardiovascular Disease

## 2023-12-19 ENCOUNTER — Other Ambulatory Visit: Payer: Self-pay | Admitting: Internal Medicine

## 2023-12-24 ENCOUNTER — Encounter (HOSPITAL_COMMUNITY): Payer: Self-pay | Admitting: *Deleted

## 2023-12-24 ENCOUNTER — Ambulatory Visit (HOSPITAL_COMMUNITY)
Admission: EM | Admit: 2023-12-24 | Discharge: 2023-12-24 | Disposition: A | Discharge status: Home / Self Care | Attending: Physician Assistant | Admitting: Physician Assistant

## 2023-12-24 DIAGNOSIS — S31105A Unspecified open wound of abdominal wall, periumbilic region without penetration into peritoneal cavity, initial encounter: Secondary | ICD-10-CM | POA: Diagnosis not present

## 2023-12-24 DIAGNOSIS — R238 Other skin changes: Secondary | ICD-10-CM | POA: Diagnosis not present

## 2023-12-24 MED ORDER — MUPIROCIN 2 % EX OINT: 1.0000 | TOPICAL_OINTMENT | Freq: Two times a day (BID) | CUTANEOUS | 0 refills | Status: AC

## 2023-12-24 NOTE — Discharge Instructions (Signed)
 You were seen today for concerns of drainage and bleeding from your bellybutton. Exam of the area reveals some skin breakdown and tenderness with manipulation.  At this time I am concerned that you have a superficial skin infection of the umbilical area.  I am starting you on an antibiotic ointment for you to apply to the area twice per day for at least 7 days.  This is called mupirocin and comes in an ointment that you can apply to the area with a Q-tip after bathing and cleansing the navel. You can keep the area clean with warm water and a gentle soap.  Please try not to over manipulate the area as this can lead to further tenderness. Over the next few days if you feel like there is more drainage, bleeding, tenderness, swelling of the area, warm to the touch please return here as this may be signs of a deeper or more severe infection.

## 2023-12-24 NOTE — ED Triage Notes (Signed)
 Pt states her navel has been draining since this weekend. She did seen some blood when she washed the area. She has put Vaseline on the area.

## 2023-12-24 NOTE — ED Provider Notes (Signed)
 [facility]    CSN: [id number] Arrival date & time: 12/24/23  9096      History   Chief Complaint No chief complaint on file.   HPI Marilyn [name] is a 68 y.o. female.  has a past medical history of Allergy, Anemia, Anxiety, Breast cancer (HCC) (06/06/2011), Cancer (HCC), Cigarette smoker, Colon polyp, Complication of anesthesia, COPD (chronic obstructive pulmonary disease) (HCC), Depression, Diverticulosis of colon, DJD (degenerative joint disease), Family history of breast cancer, Family history of colon cancer, Family history of pancreatic cancer, Family history of prostate cancer, Gallstones, GERD (gastroesophageal reflux disease), History of anemia, History of bronchitis, History of sarcoidosis, Hyperlipidemia, Hypertension, Incisional breast wound, Joint pain, Leg swelling, Panic disorder, Personal history of radiation therapy, Pre-diabetes, S/P radiation therapy (08/17/11 - 09/28/11), Sialoadenitis of submandibular gland (2016), and Vitamin D  deficiency.   HPI   Pt is here for concerns of drainage from the naval. She states she was cleaning the area on Sat and noticed dry skin and blood. She states on Sun she noticed evidence of purulent drainage and more bleeding  She states she has now noticed a white discoloration around the inside and there is still appearance of purulent material that she is concerned means there is an infection  She denies warmth around the area but states it is a bit tender    Past Medical History:  Diagnosis Date  . Allergy   . Anemia    hx  . Anxiety    panic disorder  . Breast cancer (HCC) 06/06/2011   bc  left breast 3 o'clock dx=invasive ductal ca uoqER/PR=positive  . Cancer (HCC)    BREAST - left  . Cigarette smoker   . Colon polyp   . Complication of anesthesia    DIFFICULTY AWAKENING, BP INCREASE AFTER CHOLECYSTECTEMY   . COPD (chronic obstructive pulmonary disease) (HCC)   . Depression   . Diverticulosis of colon    . DJD (degenerative joint disease)   . Family history of breast cancer   . Family history of colon cancer   . Family history of pancreatic cancer   . Family history of prostate cancer   . Gallstones   . GERD (gastroesophageal reflux disease)    Pt. denies having GERD. Unable to remove it.  SABRA History of anemia   . History of bronchitis   . History of sarcoidosis   . Hyperlipidemia    no meds needed  . Hypertension   . Incisional breast wound    AT AGE 86 BILATERAL INCISION TO BREAST MADE  . Joint pain   . Leg swelling   . Panic disorder   . Personal history of radiation therapy   . Pre-diabetes   . S/P radiation therapy 08/17/11 - 09/28/11   LLQ - 50 Gy/25 Fractions with Boost of 10 Gy / 5 fractions  . Sialoadenitis of submandibular gland 2016  . Vitamin D  deficiency     Patient Active Problem List   Diagnosis Date Noted  . Hypertension 09/17/2023  . Visceral obesity 09/17/2023  . Prediabetes- new onset 08/27/2023  . Mixed hyperlipidemia 08/27/2023  . Iron deficiency 08/27/2023  . Combined forms of age-related cataract of both eyes 08/09/2023  . PVD (posterior vitreous detachment), left 08/09/2023  . Vitreous floaters of both eyes 08/09/2023  . Status post total right knee replacement 07/02/2023  . Primary osteoarthritis of right knee 07/01/2023  . Hypercholesterolemia 05/10/2022  . Trochanteric bursitis of right hip 05/10/2022  . Preop exam for  internal medicine 02/09/2022  . Plantar flexed metatarsal bone of right foot 01/11/2022  . Grief 09/28/2021  . Leg swelling 04/18/2021  . Incisional breast wound 04/18/2021  . History of sarcoidosis 04/18/2021  . History of bronchitis 04/18/2021  . GERD (gastroesophageal reflux disease) 04/18/2021  . Family history of prostate cancer 04/18/2021  . Depression 04/18/2021  . Complication of anesthesia 04/18/2021  . Colon polyp 04/18/2021  . Cigarette smoker 04/18/2021  . Cancer (HCC) 04/18/2021  . Coronary atherosclerosis  01/30/2021  . Aortic atherosclerosis 01/30/2021  . Primary hypertension 01/14/2021  . 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 Knee pain, chronic 08/28/2019  . Elevated BP without diagnosis of hypertension 08/28/2019  . Callus of foot 08/28/2019  . Cholelithiasis 12/26/2018  . Genetic testing 12/26/2017  . Family history of breast cancer   . Family history of pancreatic cancer   . Family history of colon cancer   . Morbid obesity (HCC) 03/13/2016  . Dyspnea 11/09/2015  . Rash and nonspecific skin eruption 07/27/2015  . Well adult exam 07/23/2014  . Sialoadenitis 06/02/2014  . Edema 07/07/2013  . AB (asthmatic bronchitis) 06/23/2013  . Anxiety   . Malignant neoplasm of upper-outer quadrant of left breast in female, 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  . Allergy 06/06/2011  . COLONIC POLYPS 01/25/2010  . DIVERTICULOSIS OF COLON 01/25/2010  . DEGENERATIVE JOINT DISEASE 11/06/2008  . PANIC DISORDER 07/23/2008  . Sarcoidosis 10/13/2007  . Vitamin D  deficiency 10/13/2007  . ANEMIA 10/13/2007  . BRONCHITIS 10/13/2007  . Dyslipidemia 04/23/2007  . Adjustment disorder with mixed anxiety and depressed mood 04/23/2007  . Venous (peripheral) insufficiency 04/23/2007  . HEADACHE 04/23/2007    Past Surgical History:  Procedure Laterality Date  . BIOPSY BREAST     left breast  as a teenager  benign  . BREAST BIOPSY Left 06/06/2011  . BREAST EXCISIONAL BIOPSY Bilateral   . BREAST LUMPECTOMY Left 2013  . BREAST SURGERY  07/05/11   left breast lumpectomy with needle loc & axillary sln bx  . BUNIONECTOMY  2011   bilateral by Dr Lea  . CHOLECYSTECTOMY  02/2019  . COLONOSCOPY     polyp  . cyst removed from right hand  1995  . POLYPECTOMY    . TONSILLECTOMY  1972  . TOTAL ABDOMINAL HYSTERECTOMY  2000   Dr Rosalynn  . TOTAL KNEE ARTHROPLASTY Right 07/02/2023   Procedure: ARTHROPLASTY, KNEE, TOTAL;  Surgeon: Jerri Kay HERO, MD;  Location: [facility];  Service: Orthopedics;  Laterality:  Right;  . TRIGGER FINGER RELEASE  2008   Dr Sissy    OB History   No obstetric history on file.    Obstetric Comments  Menarche age 89, nulliparity, HRT x 1 year, Hysterectomy          Home Medications    Prior to Admission medications   Medication Sig Start Date End Date Taking? Authorizing Provider  budesonide -formoterol  (SYMBICORT ) 80-4.5 MCG/ACT inhaler Inhale 2 puffs into the lungs 2 (two) times daily. 11/07/23  Yes Olalere, Adewale A, MD  busPIRone  (BUSPAR ) 30 MG tablet TAKE 1/2 TO 1 (ONE-HALF TO ONE) TABLET BY MOUTH TWICE DAILY 12/19/23  Yes Plotnikov, Aleksei V, MD  cetirizine  (ZYRTEC ) 10 MG chewable tablet Chew 10 mg by mouth daily.   Yes [provider]  Cholecalciferol  1.25 MG (50000 UT) TABS 1 po q 7 days taking OTC D3 12/10/23  Yes Opalski, Barnie, DO  mupirocin ointment (BACTROBAN) 2 % Apply 1 Application topically 2 (two) times  daily for 7 days. 12/24/23 12/31/23 Yes Zania Kalisz E, PA-C  spironolactone  (ALDACTONE ) 50 MG tablet Take 1 tablet (50 mg total) by mouth daily. 05/01/23  Yes Vannie Reche RAMAN, NP  albuterol  (VENTOLIN  HFA) 108 (90 Base) MCG/ACT inhaler Inhale 2 puffs into the lungs every 6 (six) hours as needed for wheezing or shortness of breath. 01/04/23   Olalere, Jennet LABOR, MD  ALPRAZolam  (XANAX ) 1 MG tablet Take 0.5-1 tablets (0.5-1 mg total) by mouth 3 (three) times daily as needed for anxiety. 10/03/23   Plotnikov, Aleksei V, MD  HYDROcodone -acetaminophen  (NORCO/VICODIN) 5-325 MG tablet Take 1-2 tablets by mouth every 6 (six) hours as needed for moderate pain (pain score 4-6). 07/12/23   Jule Ronal CROME, PA-C  meloxicam  (MOBIC ) 15 MG tablet Take 1 tablet (15 mg total) by mouth daily as needed for pain. 10/22/23   Plotnikov, Aleksei V, MD  Menthol , Topical Analgesic, (BIOFREEZE) 10 % CREA Apply 1 application  topically daily as needed (pain).    [provider]  rosuvastatin  (CRESTOR ) 10 MG tablet Take 1 tablet (10 mg total) by mouth daily.  03/15/23 11/12/23  Raford Riggs, MD  Trolamine Salicylate (BLUE-EMU HEMP EX) Apply 1 Application topically daily as needed (pain).    [provider]    Family History Family History  Problem Relation Age of Onset  . Obesity Mother   . Alcohol abuse Mother   . Depression Mother   . Hyperlipidemia Mother   . Dementia Mother   . Hypertension Mother   . Anemia Mother   . Other Mother        + H Pylori  . Seizures Mother   . Heart disease Mother   . Heart disease Father   . Hyperlipidemia Father   . Hypertension Father   . Prostate cancer Father        dx over 68  . Colon cancer Father 68       diagnosed 2009  . Bladder Cancer Father        dx over 52  . Colon polyps Father   . Hypertension Sister   . Breast cancer Sister        dx in her 30s; mat half sister  . Pancreatic cancer Sister 64       d. 75  . Lung cancer Sister   . Hypertension Sister   . Sarcoidosis Sister        mat 1/2 sister  . Multiple sclerosis Brother        #2  . Hypertension Brother        #1  . Hyperlipidemia Brother   . Prostate cancer Brother        dx under 50  . Dementia Maternal Aunt   . Dementia Maternal Aunt   . Seizures Maternal Uncle   . Lung cancer Maternal Uncle   . Lung cancer Paternal Aunt   . Breast cancer Cousin        pat first cousin d. 74  . Sarcoidosis Cousin        mat first cousin  . Breast cancer Cousin        maternal 2nd cousins - distant  . Brain cancer Other        benign  . Esophageal cancer Neg Hx   . Stomach cancer Neg Hx   . Rectal cancer Neg Hx     Social History Social History   Tobacco Use  . Smoking status: Former    Current packs/day:  0.00    Types: Cigarettes    Quit date: 12/28/2020    Years since quitting: 2.9  . Smokeless tobacco: Never  Vaping Use  . Vaping status: Never Used  Substance Use Topics  . Alcohol use: Yes    Alcohol/week: 0.0 - 1.0 standard drinks of alcohol    Comment: social  . Drug use: Never      Allergies   Hydrocodone , Lexapro [escitalopram oxalate], Microzide  [hydrochlorothiazide ], Oxycodone  hcl, and Penicillins   Review of Systems Review of Systems  Skin:  Positive for color change and wound.     Physical Exam Triage Vital Signs ED Triage Vitals  Encounter Vitals Group     BP 12/24/23 0927 (!) 141/59     Girls Systolic BP Percentile --      Girls Diastolic BP Percentile --      Boys Systolic BP Percentile --      Boys Diastolic BP Percentile --      Pulse Rate 12/24/23 0927 74     Resp 12/24/23 0927 16     Temp 12/24/23 0927 98.3 F (36.8 C)     Temp Source 12/24/23 0927 Oral     SpO2 12/24/23 0927 99 %     Weight --      Height --      Head Circumference --      Peak Flow --      Pain Score 12/24/23 0925 0     Pain Loc --      Pain Education --      Exclude from Growth Chart --    No data found.  Updated Vital Signs BP 135/82 (BP Location: Right Arm)   Pulse 74   Temp 98.3 F (36.8 C) (Oral)   Resp 16   SpO2 99%   Visual Acuity Right Eye Distance:   Left Eye Distance:   Bilateral Distance:    Right Eye Near:   Left Eye Near:    Bilateral Near:     Physical Exam Vitals reviewed.  Constitutional:      General: She is awake.     Appearance: Normal appearance. She is well-developed and well-groomed.  HENT:     Head: Normocephalic and atraumatic.  Eyes:     General: Lids are normal. Gaze aligned appropriately.     Extraocular Movements: Extraocular movements intact.     Conjunctiva/sclera: Conjunctivae normal.  Pulmonary:     Effort: Pulmonary effort is normal.  Abdominal:      Comments: No surrounding erythema, swelling, tenderness.  Ulcerated areas within the umbilicus do appear slightly tender when palpated with swabs  to allow visualization  Neurological:     Mental Status: She is alert and oriented to person, place, and time.  Psychiatric:        Attention and Perception: Attention and perception normal.        Mood and  Affect: Mood and affect normal.        Speech: Speech normal.        Behavior: Behavior normal. Behavior is cooperative.      UC Treatments / Results  Labs (all labs ordered are listed, but only abnormal results are displayed) Labs Reviewed - No data to display  EKG   Radiology No results found.  Procedures Procedures (including critical care time)  Medications Ordered in UC Medications - No data to display  Initial Impression / Assessment and Plan / UC Course  I have reviewed the triage vital signs and the nursing  notes.  Pertinent labs & imaging results that were available during my care of the patient were reviewed by me and considered in my medical decision making (see chart for details).      Final Clinical Impressions(s) / UC Diagnoses   Final diagnoses:  Skin breakdown  Open wound of umbilical region, initial encounter    Patient appears to have superficial    Discharge Instructions      You were seen today for concerns of drainage and bleeding from your bellybutton. Exam of the area reveals some skin breakdown and tenderness with manipulation.  At this time I am concerned that you have a superficial skin infection of the umbilical area.  I am starting you on an antibiotic ointment for you to apply to the area twice per day for at least 7 days.  This is called mupirocin and comes in an ointment that you can apply to the area with a Q-tip after bathing and cleansing the navel. You can keep the area clean with warm water and a gentle soap.  Please try not to over manipulate the area as this can lead to further tenderness. Over the next few days if you feel like there is more drainage, bleeding, tenderness, swelling of the area, warm to the touch please return here as this may be signs of a deeper or more severe infection.     ED Prescriptions     Medication Sig Dispense Auth. Provider   mupirocin ointment (BACTROBAN) 2 % Apply 1 Application topically 2  (two) times daily for 7 days. 22 g Martavia Tye E, PA-C      PDMP not reviewed this encounter.

## 2023-12-31 ENCOUNTER — Encounter: Payer: Self-pay | Admitting: Radiology

## 2024-01-07 ENCOUNTER — Ambulatory Visit (INDEPENDENT_AMBULATORY_CARE_PROVIDER_SITE_OTHER): Payer: Self-pay | Admitting: Family Medicine

## 2024-01-07 ENCOUNTER — Encounter (HOSPITAL_BASED_OUTPATIENT_CLINIC_OR_DEPARTMENT_OTHER): Payer: Self-pay | Admitting: *Deleted

## 2024-01-07 ENCOUNTER — Encounter (INDEPENDENT_AMBULATORY_CARE_PROVIDER_SITE_OTHER): Payer: Self-pay | Admitting: Family Medicine

## 2024-01-07 DIAGNOSIS — E559 Vitamin D deficiency, unspecified: Secondary | ICD-10-CM | POA: Diagnosis not present

## 2024-01-07 DIAGNOSIS — I10 Essential (primary) hypertension: Secondary | ICD-10-CM | POA: Diagnosis not present

## 2024-01-07 DIAGNOSIS — R7303 Prediabetes: Secondary | ICD-10-CM | POA: Diagnosis not present

## 2024-01-07 DIAGNOSIS — Z6836 Body mass index (BMI) 36.0-36.9, adult: Secondary | ICD-10-CM

## 2024-01-07 NOTE — Telephone Encounter (Signed)
-----   Message from [facility] sent at 01/06/2024  8:15 AM EST ----- Tammy was a note about her sleep study being denied but that her insurance would cover a home sleep study type 3 but not what we originally ordered.  That is fine with me.    Tiffany C. Raford, MD, FACC

## 2024-01-07 NOTE — Progress Notes (Signed)
 Marilyn [name], D.O.  ABFM, ABOM Specializing in Clinical Bariatric Medicine  Office located at: [address], [place]  [address]      A) FOR THE CHRONIC DISEASE OF OBESITY:  Chief complaint: Obesity Marilyn [name] is here to discuss her progress with her obesity treatment plan.   History of present illness / Interval history:  Marilyn [name] is here today for her follow-up office visit.  Since last OV on 12/10/2023, pt is down 2 lbs.    12/10/23 09:00 01/07/24 10:00   Body Fat % 48.9 % 47.9 %  Muscle Mass (lbs) 105.2 lbs 106.4 lbs  Fat Mass (lbs) 105.2 lbs 103 lbs  Total Body Water (lbs) 79.8 lbs 76.4 lbs  Visceral Fat Rating  15 15  Counseling done on how various foods will affect these numbers and how to maximize success   Total lbs lost to date: -4 lbs Total Fat Mass in lbs lost to date: -9.4 Total weight loss percentage to date: -1.83 %   Nutrition Therapy She is on the Category 1 Plan with lunch options and states she is following her eating plan approximately 75 % of the time.   - Tracking Calories/Macros: yes  - Eating More Whole Foods: no -    - Adequate Protein Intake: yes  - Adequate Water Intake: no -    - Skipping Meals: yes  - Sleeping 7-9 Hours/ Night: no -     [name] is currently in the action stage of change. As such, her goal is to continue weight management plan.  She has agreed to: switch to journaling 1100-1200 calories and 85++ grams of protein per day   Physical Activity Pt is walking 30  minutes 3 days per week   [name] has been advised to work up to 300-450 minutes of moderate intensity aerobic activity a week and strengthening exercises 2-3 times per week for cardiovascular health, weight loss maintenance and preservation of muscle mass.  She has agreed to : Think about enjoyable ways to increase daily physical activity and overcoming barriers to exercise, Increase physical activity in their day and reduce  sedentary time (increase NEAT)., Increase volume of physical activity to a goal of 240 minutes a week, and Combine aerobic and strengthening exercises for efficiency and improved cardiometabolic health.   Behavioral Modifications Evidence-based interventions for health behavior change were utilized today including the discussion of  1) self monitoring techniques:  journal 2) problem-solving barriers:  hard time of year without family  3) self care:  listening to music, exercise 4) SMART goals for next OV:  Increase walking and other forms of exercise and not don't skip meals.  Regarding patient's less desirable eating habits and patterns, we employed the technique of small changes.   We discussed the following today: increasing lean protein intake to established goals, avoiding skipping meals, increasing water intake , work on tracking and journaling calories using tracking application, and discussed pre-packaged healthier meals such as [employer] All Natural, Whole 30 chicken meals, Longlife meal prep and Factor Meals etc Additional resources provided today: Handout on Daily Food Journaling Log   Medical Interventions/ Pharmacotherapy Previous Bariatric surgery: none Pharmacotherapy for weight loss: She is not currently taking medications  for medical weight loss.    We discussed various medication options to help [name] with her weight loss efforts and we both agreed to : Continue with current nutritional and behavioral strategies   B) OBESITY RELATED CONDITIONS ADDRESSED TODAY:  Morbid obesity (  HCC) starting BMI 37.01 BMI 36.0-36.9,adult current BMI 36.35   Prediabetes Assessment & Plan Lab Results  Component Value Date   HGBA1C 6.1 (H) 12/10/2023   HGBA1C 5.8 (H) 08/13/2023   HGBA1C 6.4 02/08/2023   INSULIN  14.9 12/10/2023   INSULIN  11.3 08/13/2023  Managed by dietary and lifestyle interventions. Has reported having increased hunger and cravings due to the upcoming holidays.  Reviewed labs with pt from 12/10/2023. A1c has worsened from 5.8 on 08/13/2023 to 6.1 on 12/10/2023. Fasting insulin  increased from 11.3 to 14.9. To help reduces these levels, encouraged pt to adhere to MP and decrease adipose tissue. Discussed Metformin to help manage insulin  and A1c levles and also helps to reduce hunger and cravings; pt denies for now  Encouraged to journal intake. Cont decreasing simple carbs/sugars, increasing lean proteins and increase exercise as able to promote weight loss.   Hypertension, unspecified type with aortic arthrosclerosis (12/22) Assessment & Plan BP Readings from Last 3 Encounters:  01/07/24 131/78  12/24/23 135/82  12/12/23 124/64   The 10-year ASCVD risk score (Arnett DK, et al., 2019) is: 9.7%  Lab Results  Component Value Date   CREATININE 0.73 08/13/2023  Currently on Spironolactone  50 mg daily with good compliance and tolerance. Blood pressure is well controlled. Pt asx. No acute concerns. Cont to follow up with cardiologist. Cont following a low-sodium, heart-heathy diet and increase exercise as able.    Vitamin D  deficiency Assessment & Plan Lab Results  Component Value Date   VD25OH 66.8 12/10/2023   VD25OH 27.8 (L) 08/13/2023   VD25OH 34.46 07/27/2015  Currently on OTC Vitamin D  50,000 units once weekly with good compliance and tolerance. Reviewed labs with pt from 12/10/2023; Vit D levels are at goal. Cont regimen and recheck levels in 3-4 months.    There are no discontinued medications.   No orders of the defined types were placed in this encounter.     Follow up:   No follow-ups on file. *** She was informed of the importance of frequent follow up visits to maximize her success with intensive lifestyle modifications for her multiple health conditions.   Weight Summary and Biometrics   Weight Lost Since Last Visit: 2lb  Weight Gained Since Last Visit: 0lb  ***  Vitals Temp: 98.3 F (36.8 C) BP: 131/78 Pulse Rate:  87 SpO2: 96 %   Anthropometric Measurements Height: 5' 4.5 (1.638 m) Weight: 215 lb (97.5 kg) BMI (Calculated): 36.35 Weight at Last Visit: 217lb Weight Lost Since Last Visit: 2lb Weight Gained Since Last Visit: 0lb Starting Weight: 219lb Total Weight Loss (lbs): 4 lb (1.814 kg) Peak Weight: 227lb   Body Composition  Body Fat %: 47.9 % Fat Mass (lbs): 103 lbs Muscle Mass (lbs): 106.4 lbs Total Body Water (lbs): 76.4 lbs Visceral Fat Rating : 15   Other Clinical Data Fasting: Yes Labs: no Today's Visit #: 7 Starting Date: 08/13/23    Objective:   PHYSICAL EXAM: Blood pressure 131/78, pulse 87, temperature 98.3 F (36.8 C), height 5' 4.5 (1.638 m), weight 215 lb (97.5 kg), SpO2 96%. Body mass index is 36.33 kg/m.  General: she is overweight, cooperative and in no acute distress. PSYCH: Has normal mood, affect and thought process.   HEENT: EOMI, sclerae are anicteric. Lungs: Normal breathing effort, no conversational dyspnea. Extremities: Moves * 4 Neurologic: A and O * 3, good insight  DIAGNOSTIC DATA REVIEWED: BMET    Component Value Date/Time   NA 138 08/13/2023 1055   NA  138 11/04/2013 0929   K 4.5 08/13/2023 1055   K 3.8 11/04/2013 0929   CL 101 08/13/2023 1055   CL 107 03/22/2012 0853   CO2 17 (L) 08/13/2023 1055   CO2 23 11/04/2013 0929   GLUCOSE 90 08/13/2023 1055   GLUCOSE 89 06/22/2023 1400   GLUCOSE 100 11/04/2013 0929   GLUCOSE 99 03/22/2012 0853   BUN 11 08/13/2023 1055   BUN 12.1 11/04/2013 0929   CREATININE 0.73 08/13/2023 1055   CREATININE 0.8 11/04/2013 0929   CALCIUM  10.1 08/13/2023 1055   CALCIUM  9.6 11/04/2013 0929   GFRNONAA >60 06/22/2023 1400   GFRAA >60 07/14/2016 2305   Lab Results  Component Value Date   HGBA1C 6.1 (H) 12/10/2023   HGBA1C 6.2 02/09/2022   Lab Results  Component Value Date   INSULIN  14.9 12/10/2023   INSULIN  11.3 08/13/2023   Lab Results  Component Value Date   TSH 1.420 08/13/2023   CBC     Component Value Date/Time   WBC 8.9 08/13/2023 1055   WBC 10.2 06/22/2023 1400   RBC 5.12 08/13/2023 1055   RBC 5.32 (H) 06/22/2023 1400   HGB 11.9 08/13/2023 1055   HGB 12.2 11/04/2013 0928   HCT 40.1 08/13/2023 1055   HCT 38.9 11/04/2013 0928   PLT 283 08/13/2023 1055   MCV 78 (L) 08/13/2023 1055   MCV 74.7 (L) 11/04/2013 0928   MCH 23.2 (L) 08/13/2023 1055   MCH 23.5 (L) 06/22/2023 1400   MCHC 29.7 (L) 08/13/2023 1055   MCHC 31.0 06/22/2023 1400   RDW 14.4 08/13/2023 1055   RDW 14.7 (H) 11/04/2013 0928   Iron Studies    Component Value Date/Time   IRON 74 12/25/2019 1415   TIBC 319 12/25/2019 1415   FERRITIN 110 12/25/2019 1415   IRONPCTSAT 23 12/25/2019 1415   Lipid Panel     Component Value Date/Time   CHOL 158 08/13/2023 1055   TRIG 74 08/13/2023 1055   HDL 78 08/13/2023 1055   CHOLHDL 2.0 08/13/2023 1055   CHOLHDL 3 02/08/2023 1127   VLDL 14.6 02/08/2023 1127   LDLCALC 66 08/13/2023 1055   LDLDIRECT 102 (H) 10/14/2021 1326   LDLDIRECT 118.9 12/18/2011 1138   Hepatic Function Panel     Component Value Date/Time   PROT 7.4 08/13/2023 1055   PROT 7.2 11/04/2013 0929   ALBUMIN 4.5 08/13/2023 1055   ALBUMIN 3.6 11/04/2013 0929   AST 14 08/13/2023 1055   AST 12 11/04/2013 0929   ALT 13 08/13/2023 1055   ALT 13 11/04/2013 0929   ALKPHOS 104 08/13/2023 1055   ALKPHOS 108 11/04/2013 0929   BILITOT 0.4 08/13/2023 1055   BILITOT 0.28 11/04/2013 0929   BILIDIR 0.0 11/12/2018 0912      Component Value Date/Time   TSH 1.420 08/13/2023 1055   Nutritional Lab Results  Component Value Date   VD25OH 66.8 12/10/2023   VD25OH 27.8 (L) 08/13/2023   VD25OH 34.46 07/27/2015    Attestations:   I, ***, acting as a medical scribe for Marilyn Jenkins, DO., have compiled all relevant documentation for today's office visit on behalf of Marilyn Jenkins, DO, while in the presence of [employer], DO.  I have spent *** minutes in the care of the patient today  including *** minutes face-to-face assessing and reviewing listed medical problems above as outlined in office visit note and providing nutritional and behavioral counseling as outlined in obesity care plan.   I have reviewed the above  documentation for accuracy and completeness, and I agree with the above. Marilyn JINNY [name], D.O.  The 21st Century Cures Act was signed into law in 2016 which includes the topic of electronic health records.  This provides immediate access to information in MyChart.  This includes consultation notes, operative notes, office notes, lab results and pathology reports.  If you have any questions about what you read please let us  know at your next visit so we can discuss your concerns and take corrective action if need be.  We are right here with you.

## 2024-01-07 NOTE — Telephone Encounter (Signed)
 This encounter was created in error - please disregard.

## 2024-01-08 ENCOUNTER — Ambulatory Visit (HOSPITAL_COMMUNITY)
Admission: EM | Admit: 2024-01-08 | Discharge: 2024-01-08 | Disposition: A | Discharge status: Home / Self Care | Attending: Emergency Medicine | Admitting: Emergency Medicine

## 2024-01-08 ENCOUNTER — Encounter (HOSPITAL_COMMUNITY): Payer: Self-pay

## 2024-01-08 DIAGNOSIS — Z20822 Contact with and (suspected) exposure to covid-19: Secondary | ICD-10-CM | POA: Diagnosis not present

## 2024-01-08 DIAGNOSIS — J069 Acute upper respiratory infection, unspecified: Secondary | ICD-10-CM | POA: Diagnosis not present

## 2024-01-08 LAB — POC COVID19/FLU A&B COMBO
Covid Antigen, POC: NEGATIVE
Influenza A Antigen, POC: NEGATIVE
Influenza B Antigen, POC: NEGATIVE

## 2024-01-08 NOTE — ED Triage Notes (Signed)
 Onset this past Thursday with sneezing and green nasal drainage. Has a slight dry cough. Took alka-seltzer with moderate relief.   Her step daughter tested positive for COVID Monday and they were around her this past weekend. Has had her daughter staying with them while the step daughter is sick.

## 2024-01-08 NOTE — Telephone Encounter (Signed)
Ordered and mychart message sent to patient

## 2024-01-08 NOTE — Discharge Instructions (Signed)
 COVID and flu testing were negative, you most likely have a different viral illness.  You can continue taking the Flonase  and Alka-Seltzer.  I suggest adding a daily antihistamine such as Claritin  or Zyrtec .  Can take Tylenol  as needed for any body aches or chills.  Symptoms should improve throughout the end of the week.  If you develop any new symptoms or continued symptoms I suggest testing for COVID at home due to your recent exposure.  If you have difficulty testing at home you can make an appointment and get re-tested here.

## 2024-01-08 NOTE — ED Provider Notes (Signed)
 [facility]    CSN: [id number] Arrival date & time: 01/08/24  1321      History   Chief Complaint Chief Complaint  Patient presents with   Covid Exposure   Nasal Congestion    HPI Marilyn [name] is a 68 y.o. female.   Patient presents to clinic over concern of nasal congestion, rhinorrhea and sneezing for the past 6 days.  Has been as recently been sick with similar symptoms.  Has been taking Alka-Seltzer with relief of symptoms.  Husband's daughter tested positive for COVID yesterday.  She has been taking care of of her 43-year-old granddaughter who has been sick recently as well.  Has not had fever or bodyaches.  Does have COPD, denies increased wheezing, shortness of breath or sputum production.  The history is provided by the patient and medical records.    Past Medical History:  Diagnosis Date   Allergy    Anemia    hx   Anxiety    panic disorder   Breast cancer (HCC) 06/06/2011   bc  left breast 3 o'clock dx=invasive ductal ca uoqER/PR=positive   Cancer (HCC)    BREAST - left   Cigarette smoker    Colon polyp    Complication of anesthesia    DIFFICULTY AWAKENING, BP INCREASE AFTER CHOLECYSTECTEMY    COPD (chronic obstructive pulmonary disease) (HCC)    Depression    Diverticulosis of colon    DJD (degenerative joint disease)    Family history of breast cancer    Family history of colon cancer    Family history of pancreatic cancer    Family history of prostate cancer    Gallstones    GERD (gastroesophageal reflux disease)    Pt. denies having GERD. Unable to remove it.   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/17/2023   Prediabetes- new onset 08/27/2023   Mixed hyperlipidemia 08/27/2023   Iron deficiency 08/27/2023   Combined forms of age-related cataract of both eyes 08/09/2023   PVD (posterior vitreous detachment), left 08/09/2023   Vitreous floaters of both eyes 08/09/2023   Status post total right knee replacement 07/02/2023   Primary osteoarthritis of right knee 07/01/2023   Hypercholesterolemia 05/10/2022   Trochanteric bursitis of right hip 05/10/2022   Preop exam for internal medicine 02/09/2022   Plantar flexed metatarsal bone of right foot 01/11/2022   Grief 09/28/2021   Leg swelling 04/18/2021   Incisional breast wound 04/18/2021   History of sarcoidosis 04/18/2021   History of bronchitis 04/18/2021   GERD (gastroesophageal reflux disease) 04/18/2021   Family history of prostate cancer 04/18/2021   Depression 04/18/2021   Complication of anesthesia 04/18/2021   Colon polyp 04/18/2021   Cigarette smoker 04/18/2021   Cancer (HCC) 04/18/2021   Coronary atherosclerosis 01/30/2021  Aortic atherosclerosis 01/30/2021   Primary hypertension 01/14/2021   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   Knee pain, chronic 08/28/2019   Elevated BP without diagnosis of hypertension 08/28/2019   Callus of foot 08/28/2019   Cholelithiasis 12/26/2018   Genetic testing 12/26/2017   Family history of breast cancer    Family history of pancreatic cancer    Family history of colon cancer    Morbid obesity (HCC) 03/13/2016   Dyspnea 11/09/2015   Rash and nonspecific skin eruption 07/27/2015   Well adult exam 07/23/2014   Sialoadenitis 06/02/2014   Edema 07/07/2013   AB (asthmatic bronchitis) 06/23/2013   Anxiety    Malignant neoplasm of upper-outer quadrant of left breast in  female, 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   Allergy 06/06/2011   COLONIC POLYPS 01/25/2010   DIVERTICULOSIS OF COLON 01/25/2010   DEGENERATIVE JOINT DISEASE 11/06/2008   PANIC DISORDER 07/23/2008   Sarcoidosis 10/13/2007   Vitamin D  deficiency 10/13/2007   ANEMIA 10/13/2007   BRONCHITIS 10/13/2007   Dyslipidemia 04/23/2007   Adjustment disorder with mixed anxiety and depressed mood 04/23/2007   Venous (peripheral) insufficiency 04/23/2007   HEADACHE 04/23/2007    Past Surgical History:  Procedure Laterality Date   BIOPSY BREAST     left breast  as a teenager  benign   BREAST BIOPSY Left 06/06/2011   BREAST EXCISIONAL BIOPSY Bilateral    BREAST LUMPECTOMY Left 2013   BREAST SURGERY  07/05/11   left breast lumpectomy with needle loc & axillary sln bx   BUNIONECTOMY  2011   bilateral by Dr Lea   CHOLECYSTECTOMY  02/2019   COLONOSCOPY     polyp   cyst removed from right hand  1995   POLYPECTOMY     TONSILLECTOMY  1972   TOTAL ABDOMINAL HYSTERECTOMY  2000   Dr Rosalynn   TOTAL KNEE ARTHROPLASTY Right 07/02/2023   Procedure: ARTHROPLASTY, KNEE, TOTAL;  Surgeon: Jerri Kay HERO, MD;  Location: [facility];  Service: Orthopedics;  Laterality: Right;   TRIGGER FINGER RELEASE  2008   Dr Sissy    OB History   No obstetric history on file.    Obstetric Comments  Menarche age 23, nulliparity, HRT x 1 year, Hysterectomy          Home Medications    Prior to Admission medications   Medication Sig Start Date End Date Taking? Authorizing Provider  albuterol  (VENTOLIN  HFA) 108 (90 Base) MCG/ACT inhaler Inhale 2 puffs into the lungs every 6 (six) hours as needed for wheezing or shortness of breath. 01/04/23  Yes Olalere, Adewale A, MD  ALPRAZolam  (XANAX ) 1 MG tablet Take 0.5-1 tablets (0.5-1 mg total) by mouth 3 (three) times daily as needed for anxiety. 10/03/23  Yes Plotnikov, Aleksei V, MD  budesonide -formoterol  (SYMBICORT ) 80-4.5 MCG/ACT inhaler Inhale 2 puffs into the lungs  2 (two) times daily. 11/07/23  Yes Olalere, Adewale A, MD  busPIRone  (BUSPAR ) 30 MG tablet TAKE 1/2 TO 1 (ONE-HALF TO ONE) TABLET BY MOUTH TWICE DAILY 12/19/23  Yes Plotnikov, Aleksei V, MD  cetirizine  (ZYRTEC ) 10 MG chewable tablet Chew 10 mg by mouth daily.   Yes [provider]  Cholecalciferol  1.25 MG (50000 UT) TABS 1 po q 7 days taking OTC D3 12/10/23  Yes Opalski, Barnie, DO  Menthol , Topical Analgesic, (BIOFREEZE) 10 % CREA Apply 1 application  topically daily as needed (pain).   Yes [provider]  spironolactone  (ALDACTONE ) 50 MG tablet Take 1 tablet (50 mg total) by mouth  daily. 05/01/23  Yes Walker, Caitlin S, NP  Trolamine Salicylate (BLUE-EMU HEMP EX) Apply 1 Application topically daily as needed (pain).   Yes [provider]  rosuvastatin  (CRESTOR ) 10 MG tablet Take 1 tablet (10 mg total) by mouth daily. 03/15/23 01/07/24  Raford Riggs, MD    Family History Family History  Problem Relation Age of Onset   Obesity Mother    Alcohol abuse Mother    Depression Mother    Hyperlipidemia Mother    Dementia Mother    Hypertension Mother    Anemia Mother    Other Mother        + H Pylori   Seizures Mother    Heart disease Mother    Heart disease Father    Hyperlipidemia Father    Hypertension Father    Prostate cancer Father        dx over 8   Colon cancer Father 62       diagnosed 2009   Bladder Cancer Father        dx over 57   Colon polyps Father    Hypertension Sister    Breast cancer Sister        dx in her 30s; mat half sister   Pancreatic cancer Sister 52       d. 71   Lung cancer Sister    Hypertension Sister    Sarcoidosis Sister        mat 1/2 sister   Multiple sclerosis Brother        #2   Hypertension Brother        #1   Hyperlipidemia Brother    Prostate cancer Brother        dx under 50   Dementia Maternal Aunt    Dementia Maternal Aunt    Seizures Maternal Uncle    Lung cancer Maternal Uncle    Lung cancer  Paternal Aunt    Breast cancer Cousin        pat first cousin d. 53   Sarcoidosis Cousin        mat first cousin   Breast cancer Cousin        maternal 2nd cousins - distant   Brain cancer Other        benign   Esophageal cancer Neg Hx    Stomach cancer Neg Hx    Rectal cancer Neg Hx     Social History Social History   Tobacco Use   Smoking status: Former    Current packs/day: 0.00    Types: Cigarettes    Quit date: 12/28/2020    Years since quitting: 3.0   Smokeless tobacco: Never  Vaping Use   Vaping status: Never Used  Substance Use Topics   Alcohol use: Yes    Alcohol/week: 0.0 - 1.0 standard drinks of alcohol    Comment: social   Drug use: Never     Allergies   Hydrocodone , Lexapro [escitalopram oxalate], Microzide  [hydrochlorothiazide ], Oxycodone  hcl, and Penicillins   Review of Systems Review of Systems  Per HPI  Physical Exam Triage Vital Signs ED Triage Vitals  Encounter Vitals Group     BP 01/08/24 1421 (!) 140/74     Girls Systolic BP Percentile --      Girls Diastolic BP Percentile --      Boys Systolic BP Percentile --      Boys Diastolic BP Percentile --      Pulse Rate 01/08/24 1421 90     Resp  01/08/24 1421 18     Temp 01/08/24 1421 98.9 F (37.2 C)     Temp Source 01/08/24 1421 Oral     SpO2 01/08/24 1421 95 %     Weight 01/08/24 1420 215 lb (97.5 kg)     Height 01/08/24 1420 5' 4.5 (1.638 m)     Head Circumference --      Peak Flow --      Pain Score 01/08/24 1419 0     Pain Loc --      Pain Education --      Exclude from Growth Chart --    No data found.  Updated Vital Signs BP (!) 140/74 (BP Location: Right Arm)   Pulse 90   Temp 98.9 F (37.2 C) (Oral)   Resp 18   Ht 5' 4.5 (1.638 m)   Wt 215 lb (97.5 kg)   SpO2 95%   BMI 36.33 kg/m   Visual Acuity Right Eye Distance:   Left Eye Distance:   Bilateral Distance:    Right Eye Near:   Left Eye Near:    Bilateral Near:     Physical Exam Vitals and nursing note  reviewed.  Constitutional:      Appearance: Normal appearance.  HENT:     Head: Normocephalic and atraumatic.     Right Ear: External ear normal.     Left Ear: External ear normal.     Nose: Congestion and rhinorrhea present.     Mouth/Throat:     Mouth: Mucous membranes are moist.     Pharynx: Posterior oropharyngeal erythema present.  Eyes:     Conjunctiva/sclera: Conjunctivae normal.  Cardiovascular:     Rate and Rhythm: Normal rate and regular rhythm.     Heart sounds: Normal heart sounds. No murmur heard. Pulmonary:     Effort: Pulmonary effort is normal. No respiratory distress.     Breath sounds: Normal breath sounds. No wheezing.  Skin:    General: Skin is warm and dry.  Neurological:     General: No focal deficit present.     Mental Status: She is alert and oriented to person, place, and time.  Psychiatric:        Mood and Affect: Mood normal.        Behavior: Behavior normal.      UC Treatments / Results  Labs (all labs ordered are listed, but only abnormal results are displayed) Labs Reviewed  POC COVID19/FLU A&B COMBO    EKG   Radiology No results found.  Procedures Procedures (including critical care time)  Medications Ordered in UC Medications - No data to display  Initial Impression / Assessment and Plan / UC Course  I have reviewed the triage vital signs and the nursing notes.  Pertinent labs & imaging results that were available during my care of the patient were reviewed by me and considered in my medical decision making (see chart for details).  Vitals and triage reviewed, patient is hemodynamically stable.  Lungs vesicular, heart with regular rate and rhythm.  Congestion, rhinorrhea and postnasal drip present.  Symptoms consistent with viral URI.  COVID and flu testing negative.  Encouraged retesting at home due to recent exposure, if she is a candidate for antivirals if she test positive.  Without evidence of COPD exacerbation.   Symptomatic management for viral illness discussed.  Plan of care, follow-up care return precautions given, no questions at this time.    Final Clinical Impressions(s) / UC Diagnoses  Final diagnoses:  Exposure to COVID-19 virus  Viral URI     Discharge Instructions      COVID and flu testing were negative, you most likely have a different viral illness.  You can continue taking the Flonase  and Alka-Seltzer.  I suggest adding a daily antihistamine such as Claritin  or Zyrtec .  Can take Tylenol  as needed for any body aches or chills.  Symptoms should improve throughout the end of the week.  If you develop any new symptoms or continued symptoms I suggest testing for COVID at home due to your recent exposure.  If you have difficulty testing at home you can make an appointment and get re-tested here.      ED Prescriptions   None    PDMP not reviewed this encounter.   Dreama, Danne Scardina  N, FNP 01/08/24 1458

## 2024-01-11 NOTE — Telephone Encounter (Signed)
 Patient viewed mychart message  ?

## 2024-01-15 ENCOUNTER — Inpatient Hospital Stay: Payer: Self-pay | Admitting: Adult Health

## 2024-01-16 ENCOUNTER — Inpatient Hospital Stay: Attending: Adult Health | Admitting: Adult Health

## 2024-01-16 ENCOUNTER — Encounter: Payer: Self-pay | Admitting: Adult Health

## 2024-01-16 VITALS — BP 130/61 | HR 75 | Temp 98.0°F | Resp 18 | Ht 64.5 in | Wt 222.2 lb

## 2024-01-16 DIAGNOSIS — Z1721 Progesterone receptor positive status: Secondary | ICD-10-CM | POA: Diagnosis not present

## 2024-01-16 DIAGNOSIS — Z9049 Acquired absence of other specified parts of digestive tract: Secondary | ICD-10-CM | POA: Diagnosis not present

## 2024-01-16 DIAGNOSIS — M1711 Unilateral primary osteoarthritis, right knee: Secondary | ICD-10-CM | POA: Diagnosis not present

## 2024-01-16 DIAGNOSIS — Z634 Disappearance and death of family member: Secondary | ICD-10-CM | POA: Diagnosis not present

## 2024-01-16 DIAGNOSIS — J4489 Other specified chronic obstructive pulmonary disease: Secondary | ICD-10-CM | POA: Diagnosis not present

## 2024-01-16 DIAGNOSIS — F419 Anxiety disorder, unspecified: Secondary | ICD-10-CM | POA: Insufficient documentation

## 2024-01-16 DIAGNOSIS — Z170 Estrogen receptor positive status [ER+]: Secondary | ICD-10-CM | POA: Diagnosis not present

## 2024-01-16 DIAGNOSIS — Z8349 Family history of other endocrine, nutritional and metabolic diseases: Secondary | ICD-10-CM | POA: Diagnosis not present

## 2024-01-16 DIAGNOSIS — Z885 Allergy status to narcotic agent status: Secondary | ICD-10-CM | POA: Insufficient documentation

## 2024-01-16 DIAGNOSIS — Z87891 Personal history of nicotine dependence: Secondary | ICD-10-CM | POA: Diagnosis not present

## 2024-01-16 DIAGNOSIS — Z808 Family history of malignant neoplasm of other organs or systems: Secondary | ICD-10-CM | POA: Insufficient documentation

## 2024-01-16 DIAGNOSIS — Z820 Family history of epilepsy and other diseases of the nervous system: Secondary | ICD-10-CM | POA: Insufficient documentation

## 2024-01-16 DIAGNOSIS — Z801 Family history of malignant neoplasm of trachea, bronchus and lung: Secondary | ICD-10-CM | POA: Insufficient documentation

## 2024-01-16 DIAGNOSIS — I2510 Atherosclerotic heart disease of native coronary artery without angina pectoris: Secondary | ICD-10-CM | POA: Diagnosis not present

## 2024-01-16 DIAGNOSIS — Z800 Family history of malignant neoplasm of digestive organs: Secondary | ICD-10-CM | POA: Insufficient documentation

## 2024-01-16 DIAGNOSIS — Z8249 Family history of ischemic heart disease and other diseases of the circulatory system: Secondary | ICD-10-CM | POA: Insufficient documentation

## 2024-01-16 DIAGNOSIS — E782 Mixed hyperlipidemia: Secondary | ICD-10-CM | POA: Insufficient documentation

## 2024-01-16 DIAGNOSIS — C50412 Malignant neoplasm of upper-outer quadrant of left female breast: Secondary | ICD-10-CM | POA: Diagnosis not present

## 2024-01-16 DIAGNOSIS — Z79811 Long term (current) use of aromatase inhibitors: Secondary | ICD-10-CM | POA: Insufficient documentation

## 2024-01-16 DIAGNOSIS — I10 Essential (primary) hypertension: Secondary | ICD-10-CM | POA: Diagnosis not present

## 2024-01-16 DIAGNOSIS — Z90710 Acquired absence of both cervix and uterus: Secondary | ICD-10-CM | POA: Diagnosis not present

## 2024-01-16 DIAGNOSIS — Z83438 Family history of other disorder of lipoprotein metabolism and other lipidemia: Secondary | ICD-10-CM | POA: Insufficient documentation

## 2024-01-16 DIAGNOSIS — Z818 Family history of other mental and behavioral disorders: Secondary | ICD-10-CM | POA: Insufficient documentation

## 2024-01-16 DIAGNOSIS — Z923 Personal history of irradiation: Secondary | ICD-10-CM | POA: Diagnosis not present

## 2024-01-16 DIAGNOSIS — Z860100 Personal history of colon polyps, unspecified: Secondary | ICD-10-CM | POA: Insufficient documentation

## 2024-01-16 DIAGNOSIS — Z811 Family history of alcohol abuse and dependence: Secondary | ICD-10-CM | POA: Insufficient documentation

## 2024-01-16 DIAGNOSIS — Z8052 Family history of malignant neoplasm of bladder: Secondary | ICD-10-CM | POA: Insufficient documentation

## 2024-01-16 DIAGNOSIS — Z79899 Other long term (current) drug therapy: Secondary | ICD-10-CM | POA: Insufficient documentation

## 2024-01-16 DIAGNOSIS — Z803 Family history of malignant neoplasm of breast: Secondary | ICD-10-CM | POA: Diagnosis not present

## 2024-01-16 DIAGNOSIS — Z83719 Family history of colon polyps, unspecified: Secondary | ICD-10-CM | POA: Insufficient documentation

## 2024-01-16 NOTE — Progress Notes (Signed)
 [facility] Cancer Follow up:    Marilyn [name], Marilyn GAILS, MD [address] [place] [address]   DIAGNOSIS: Cancer Staging  Malignant neoplasm of upper-outer quadrant of left breast in female, estrogen receptor positive (HCC) Staging form: Breast, AJCC 7th Edition - Clinical: Stage IA (T1c, N0, cM0) - Unsigned Laterality: Left Staging comments: Staged In Breast Conference  4.17.13  - Pathologic: No stage assigned - Unsigned Laterality: Left    SUMMARY OF ONCOLOGIC HISTORY: Oncology History Overview Note  ATM and MUTYH VUS.  Otherwise negative genetic testing   Malignant neoplasm of upper-outer quadrant of left breast in female, estrogen receptor positive (HCC)  06/20/2011 Surgery   Left breast lumpectomy with sentinel lymph node biopsy: 1 cm IDC, grade 1, ER positive, PR positive, HER-2 negative, Ki-67 6%   08/17/2011 - 09/28/2011 Radiation Therapy   Adjuvant radiation therapy   10/02/2011 - 11/23/2016 Anti-estrogen oral therapy   Arimidex  1 mg daily started 10/02/2011 stopped March 2015 for pain and restarted August 2015   12/21/2017 Genetic Testing   ATM c.3191T>C and MUTYH c.920G>A VUS identified on the common hereditary cancer panel.  The Hereditary Gene Panel offered by [facility] includes sequencing and/or deletion duplication testing of the following 47 genes: APC, ATM, AXIN2, BARD1, BMPR1A, BRCA1, BRCA2, BRIP1, CDH1, CDK4, CDKN2A (p14ARF), CDKN2A (p16INK4a), CHEK2, CTNNA1, DICER1, EPCAM (Deletion/duplication testing only), GREM1 (promoter region deletion/duplication testing only), KIT, MEN1, MLH1, MSH2, MSH3, MSH6, MUTYH, NBN, NF1, NHTL1, PALB2, PDGFRA, PMS2, POLD1, POLE, PTEN, RAD50, RAD51C, RAD51D, SDHB, SDHC, SDHD, SMAD4, SMARCA4. STK11, TP53, TSC1, TSC2, and VHL.  The following genes were evaluated for sequence changes only: SDHA and HOXB13 c.251G>A variant only. The report date is 12/21/2017.     CURRENT THERAPY:  INTERVAL HISTORY:  Discussed the use of  AI scribe software for clinical note transcription with the patient, who gave verbal consent to proceed.  History of Present Illness      Patient Active Problem List   Diagnosis Date Noted  . Hypertension 09/17/2023  . Visceral obesity 09/17/2023  . Prediabetes- new onset 08/27/2023  . Mixed hyperlipidemia 08/27/2023  . Iron deficiency 08/27/2023  . Combined forms of age-related cataract of both eyes 08/09/2023  . PVD (posterior vitreous detachment), left 08/09/2023  . Vitreous floaters of both eyes 08/09/2023  . Status post total right knee replacement 07/02/2023  . Primary osteoarthritis of right knee 07/01/2023  . Hypercholesterolemia 05/10/2022  . Trochanteric bursitis of right hip 05/10/2022  . Preop exam for internal medicine 02/09/2022  . Plantar flexed metatarsal bone of right foot 01/11/2022  . Grief 09/28/2021  . Leg swelling 04/18/2021  . Incisional breast wound 04/18/2021  . History of sarcoidosis 04/18/2021  . History of bronchitis 04/18/2021  . GERD (gastroesophageal reflux disease) 04/18/2021  . Family history of prostate cancer 04/18/2021  . Depression 04/18/2021  . Complication of anesthesia 04/18/2021  . Colon polyp 04/18/2021  . Cigarette smoker 04/18/2021  . Cancer (HCC) 04/18/2021  . Coronary atherosclerosis 01/30/2021  . Aortic atherosclerosis 01/30/2021  . Primary hypertension 01/14/2021  . 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 Knee pain, chronic 08/28/2019  . Elevated BP without diagnosis of hypertension 08/28/2019  . Callus of foot 08/28/2019  . Cholelithiasis 12/26/2018  . Genetic testing 12/26/2017  . Family history of breast cancer   . Family history of pancreatic cancer   . Family history of colon cancer   . Morbid obesity (HCC) 03/13/2016  . Dyspnea 11/09/2015  . Rash and nonspecific  skin eruption 07/27/2015  . Well adult exam 07/23/2014  . Sialoadenitis 06/02/2014  . Edema 07/07/2013  . AB (asthmatic bronchitis) 06/23/2013  . Anxiety    . Malignant neoplasm of upper-outer quadrant of left breast in female, 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  . Allergy 06/06/2011  . COLONIC POLYPS 01/25/2010  . DIVERTICULOSIS OF COLON 01/25/2010  . DEGENERATIVE JOINT DISEASE 11/06/2008  . PANIC DISORDER 07/23/2008  . Sarcoidosis 10/13/2007  . Vitamin D  deficiency 10/13/2007  . ANEMIA 10/13/2007  . BRONCHITIS 10/13/2007  . Dyslipidemia 04/23/2007  . Adjustment disorder with mixed anxiety and depressed mood 04/23/2007  . Venous (peripheral) insufficiency 04/23/2007  . HEADACHE 04/23/2007    is allergic to hydrocodone , lexapro [escitalopram oxalate], microzide  [hydrochlorothiazide ], oxycodone  hcl, and penicillins.  MEDICAL HISTORY: Past Medical History:  Diagnosis Date  . Allergy   . Anemia    hx  . Anxiety    panic disorder  . Breast cancer (HCC) 06/06/2011   bc  left breast 3 o'clock dx=invasive ductal ca uoqER/PR=positive  . Cancer (HCC)    BREAST - left  . Cigarette smoker   . Colon polyp   . Complication of anesthesia    DIFFICULTY AWAKENING, BP INCREASE AFTER CHOLECYSTECTEMY   . COPD (chronic obstructive pulmonary disease) (HCC)   . Depression   . Diverticulosis of colon   . DJD (degenerative joint disease)   . Family history of breast cancer   . Family history of colon cancer   . Family history of pancreatic cancer   . Family history of prostate cancer   . Gallstones   . GERD (gastroesophageal reflux disease)    Pt. denies having GERD. Unable to remove it.  SABRA History of anemia   . History of bronchitis   . History of sarcoidosis   . Hyperlipidemia    no meds needed  . Hypertension   . Incisional breast wound    AT AGE 42 BILATERAL INCISION TO BREAST MADE  . Joint pain   . Leg swelling   . Panic disorder   . Personal history of radiation therapy   . Pre-diabetes   . S/P radiation therapy 08/17/11 - 09/28/11   LLQ - 50 Gy/25 Fractions with Boost of 10 Gy / 5 fractions  . Sialoadenitis of  submandibular gland 2016  . Vitamin D  deficiency     SURGICAL HISTORY: Past Surgical History:  Procedure Laterality Date  . BIOPSY BREAST     left breast  as a teenager  benign  . BREAST BIOPSY Left 06/06/2011  . BREAST EXCISIONAL BIOPSY Bilateral   . BREAST LUMPECTOMY Left 2013  . BREAST SURGERY  07/05/11   left breast lumpectomy with needle loc & axillary sln bx  . BUNIONECTOMY  2011   bilateral by Dr Lea  . CHOLECYSTECTOMY  02/2019  . COLONOSCOPY     polyp  . cyst removed from right hand  1995  . POLYPECTOMY    . TONSILLECTOMY  1972  . TOTAL ABDOMINAL HYSTERECTOMY  2000   Dr Rosalynn  . TOTAL KNEE ARTHROPLASTY Right 07/02/2023   Procedure: ARTHROPLASTY, KNEE, TOTAL;  Surgeon: Jerri Kay HERO, MD;  Location: [facility] OR;  Service: Orthopedics;  Laterality: Right;  . TRIGGER FINGER RELEASE  2008   Dr Sissy    SOCIAL HISTORY: Social History   Socioeconomic History  . Marital status: Married    Spouse name: Not on file  . Number of children: 2  . Years of education: Not on file  .  Highest education level: Bachelor's degree (e.g., BA, AB, BS)  Occupational History  . Occupation: retired  Tobacco Use  . Smoking status: Former    Current packs/day: 0.00    Types: Cigarettes    Quit date: 12/28/2020    Years since quitting: 3.0  . Smokeless tobacco: Never  Vaping Use  . Vaping status: Never Used  Substance and Sexual Activity  . Alcohol use: Yes    Alcohol/week: 0.0 - 1.0 standard drinks of alcohol    Comment: social  . Drug use: Never  . Sexual activity: Not Currently    Birth control/protection: Surgical    Comment: menses age 43,hrt started  Feb 14, 1999  Other Topics Concern  . Not on file  Social History Narrative   Widowed - husband died 02/14/04 and 2 step-childrenExercises 3x per week2 cups of caffeine daily   Social Drivers of Health   Financial Resource Strain: Low Risk  (01/16/2024)   Overall Financial Resource Strain (CARDIA)   . Difficulty of Paying Living  Expenses: Not hard at all  Food Insecurity: No Food Insecurity (01/16/2024)   Hunger Vital Sign   . Worried About Programme Researcher, Broadcasting/film/video in the Last Year: Never true   . Ran Out of Food in the Last Year: Never true  Transportation Needs: No Transportation Needs (01/16/2024)   PRAPARE - Transportation   . Lack of Transportation (Medical): No   . Lack of Transportation (Non-Medical): No  Physical Activity: Insufficiently Active (01/16/2024)   Exercise Vital Sign   . Days of Exercise per Week: 2 days   . Minutes of Exercise per Session: 30 min  Stress: Stress Concern Present (01/16/2024)   [employer] of Occupational Health - Occupational Stress Questionnaire   . Feeling of Stress: To some extent  Social Connections: Moderately Integrated (01/16/2024)   Social Connection and Isolation Panel   . Frequency of Communication with Friends and Family: Once a week   . Frequency of Social Gatherings with Friends and Family: Once a week   . Attends Religious Services: More than 4 times per year   . Active Member of Clubs or Organizations: Yes   . Attends Banker Meetings: Never   . Marital Status: Married  Catering Manager Violence: Not At Risk (01/16/2024)   Humiliation, Afraid, Rape, and Kick questionnaire   . Fear of Current or Ex-Partner: No   . Emotionally Abused: No   . Physically Abused: No   . Sexually Abused: No    FAMILY HISTORY: Family History  Problem Relation Age of Onset  . Obesity Mother   . Alcohol abuse Mother   . Depression Mother   . Hyperlipidemia Mother   . Dementia Mother   . Hypertension Mother   . Anemia Mother   . Other Mother        + H Pylori  . Seizures Mother   . Heart disease Mother   . Heart disease Father   . Hyperlipidemia Father   . Hypertension Father   . Prostate cancer Father        dx over 99  . Colon cancer Father 48       diagnosed February 14, 2008  . Bladder Cancer Father        dx over 51  . Colon polyps Father   . Hypertension  Sister   . Breast cancer Sister        dx in her 30s; mat half sister  . Pancreatic cancer Sister 40  d. 67  . Lung cancer Sister   . Hypertension Sister   . Sarcoidosis Sister        mat 1/2 sister  . Multiple sclerosis Brother        #2  . Hypertension Brother        #1  . Hyperlipidemia Brother   . Prostate cancer Brother        dx under 50  . Dementia Maternal Aunt   . Dementia Maternal Aunt   . Seizures Maternal Uncle   . Lung cancer Maternal Uncle   . Lung cancer Paternal Aunt   . Breast cancer Cousin        pat first cousin d. 31  . Sarcoidosis Cousin        mat first cousin  . Breast cancer Cousin        maternal 2nd cousins - distant  . Brain cancer Other        benign  . Esophageal cancer Neg Hx   . Stomach cancer Neg Hx   . Rectal cancer Neg Hx     Review of Systems - Oncology    PHYSICAL EXAMINATION   Onc Performance Status - 01/16/24 1156       ECOG Perf Status   ECOG Perf Status Restricted in physically strenuous activity but ambulatory and able to carry out work of a light or sedentary nature, e.g., light house work, office work      KPS SCALE   KPS % SCORE Able to carry on normal activity, minor s/s of disease          Vitals:   01/16/24 1158  BP: 130/61  Pulse: 75  Resp: 18  Temp: 98 F (36.7 C)  SpO2: 100%    Physical Exam  LABORATORY DATA:  CBC    Component Value Date/Time   WBC 8.9 08/13/2023 1055   WBC 10.2 06/22/2023 1400   RBC 5.12 08/13/2023 1055   RBC 5.32 (H) 06/22/2023 1400   HGB 11.9 08/13/2023 1055   HGB 12.2 11/04/2013 0928   HCT 40.1 08/13/2023 1055   HCT 38.9 11/04/2013 0928   PLT 283 08/13/2023 1055   MCV 78 (L) 08/13/2023 1055   MCV 74.7 (L) 11/04/2013 0928   MCH 23.2 (L) 08/13/2023 1055   MCH 23.5 (L) 06/22/2023 1400   MCHC 29.7 (L) 08/13/2023 1055   MCHC 31.0 06/22/2023 1400   RDW 14.4 08/13/2023 1055   RDW 14.7 (H) 11/04/2013 0928   LYMPHSABS 1.9 08/13/2023 1055   LYMPHSABS 1.8 11/04/2013  0928   MONOABS 0.9 02/08/2023 1127   MONOABS 0.5 11/04/2013 0928   EOSABS 0.3 08/13/2023 1055   BASOSABS 0.0 08/13/2023 1055   BASOSABS 0.0 11/04/2013 0928    CMP     Component Value Date/Time   NA 138 08/13/2023 1055   NA 138 11/04/2013 0929   K 4.5 08/13/2023 1055   K 3.8 11/04/2013 0929   CL 101 08/13/2023 1055   CL 107 03/22/2012 0853   CO2 17 (L) 08/13/2023 1055   CO2 23 11/04/2013 0929   GLUCOSE 90 08/13/2023 1055   GLUCOSE 89 06/22/2023 1400   GLUCOSE 100 11/04/2013 0929   GLUCOSE 99 03/22/2012 0853   BUN 11 08/13/2023 1055   BUN 12.1 11/04/2013 0929   CREATININE 0.73 08/13/2023 1055   CREATININE 0.8 11/04/2013 0929   CALCIUM  10.1 08/13/2023 1055   CALCIUM  9.6 11/04/2013 0929   PROT 7.4 08/13/2023 1055   PROT 7.2 11/04/2013 0929  ALBUMIN 4.5 08/13/2023 1055   ALBUMIN 3.6 11/04/2013 0929   AST 14 08/13/2023 1055   AST 12 11/04/2013 0929   ALT 13 08/13/2023 1055   ALT 13 11/04/2013 0929   ALKPHOS 104 08/13/2023 1055   ALKPHOS 108 11/04/2013 0929   BILITOT 0.4 08/13/2023 1055   BILITOT 0.28 11/04/2013 0929   GFRNONAA >60 06/22/2023 1400   GFRAA >60 07/14/2016 2305     ASSESSMENT and THERAPY PLAN:   No problem-specific Assessment & Plan notes found for this encounter.   Assessment and Plan Assessment & Plan       All questions were answered. The patient knows to call the clinic with any problems, questions or concerns. We can certainly see the patient much sooner if necessary.  Total encounter time:*** minutes*in face-to-face visit time, chart review, lab review, care coordination, order entry, and documentation of the encounter time.    Morna Kendall, NP 01/16/24 12:05 PM Medical Oncology and Hematology [facility] [address] [place], [place] [address] Tel. [phone]    Fax. [phone]  *Total Encounter Time as defined by the Centers for Medicare and Medicaid Services includes, in addition to the face-to-face time of  a patient visit (documented in the note above) non-face-to-face time: obtaining and reviewing outside history, ordering and reviewing medications, tests or procedures, care coordination (communications with other health care professionals or caregivers) and documentation in the medical record.

## 2024-01-23 ENCOUNTER — Other Ambulatory Visit: Payer: Self-pay | Admitting: Radiology

## 2024-01-23 ENCOUNTER — Telehealth: Payer: Self-pay | Admitting: Orthopaedic Surgery

## 2024-01-23 ENCOUNTER — Telehealth: Payer: Self-pay

## 2024-01-23 MED ORDER — CLINDAMYCIN HCL 150 MG PO CAPS: ORAL_CAPSULE | ORAL | 0 refills | Status: AC

## 2024-01-23 NOTE — Telephone Encounter (Signed)
 thanks

## 2024-01-23 NOTE — Telephone Encounter (Signed)
 Called patient

## 2024-01-23 NOTE — Telephone Encounter (Signed)
 Patient called stating that she has a cleaning on Monday and needs an antibiotic sent to her pharmacy.  CB# [phone].  Please advise.  Thank you.

## 2024-02-07 ENCOUNTER — Encounter (INDEPENDENT_AMBULATORY_CARE_PROVIDER_SITE_OTHER): Payer: Self-pay | Admitting: Family Medicine

## 2024-02-07 ENCOUNTER — Ambulatory Visit (INDEPENDENT_AMBULATORY_CARE_PROVIDER_SITE_OTHER): Admitting: Family Medicine

## 2024-02-07 DIAGNOSIS — R7303 Prediabetes: Secondary | ICD-10-CM | POA: Diagnosis not present

## 2024-02-07 DIAGNOSIS — I10 Essential (primary) hypertension: Secondary | ICD-10-CM | POA: Diagnosis not present

## 2024-02-07 DIAGNOSIS — E559 Vitamin D deficiency, unspecified: Secondary | ICD-10-CM

## 2024-02-07 DIAGNOSIS — Z6836 Body mass index (BMI) 36.0-36.9, adult: Secondary | ICD-10-CM

## 2024-02-07 DIAGNOSIS — Z6837 Body mass index (BMI) 37.0-37.9, adult: Secondary | ICD-10-CM | POA: Diagnosis not present

## 2024-02-07 NOTE — Progress Notes (Signed)
 Marilyn [name], D.O.  ABFM, ABOM Specializing in Clinical Bariatric Medicine  Office located at: [address] [place], [place]  [address]      A) FOR THE CHRONIC DISEASE OF OBESITY:  Chief complaint: Obesity Marilyn [name] is here to discuss her progress with her obesity treatment plan.   History of present illness / Interval history:  Marilyn [name] is here today for her follow-up office visit.  Since last OV on 01/07/24, pt is up 3 lbs. Patient states that she cooked and did portion control over the holiday. She endorses eating left over and tried to be more aware of what she ate.     01/07/24 10:00 02/07/24 10:00   Body Fat % 47.9 % 50.4 %  Muscle Mass (lbs) 106.4 lbs 103.6 lbs  Fat Mass (lbs) 103 lbs 110.8 lbs  Total Body Water (lbs) 76.4 lbs 82.6 lbs  Visceral Fat Rating  15 16    - Went down 2.8 lbs in muscle   Counseling done on how various foods will affect these numbers and how to maximize success.  Total lbs lost to date: + 1 lbs Total Fat Mass in lbs lost to date: - 1.6 lbs Total weight loss percentage to date: 0.46 %    Morbid obesity (HCC) starting BMI 37.01 BMI 36.0-36.9,adult current BMI 37.19  Nutrition Therapy She is journaling 1100-1200 calories and 85++ grams of protein per day and states she is following her eating plan approximately 70 % of the time.   - Tracking Calories/Macros: yes  - Eating More Whole Foods: no  - Adequate Protein Intake: no  - Adequate Water Intake: no   - Skipping Meals: yes  - Sleeping 7-9 Hours/ Night: no    Marilyn [name] is currently in the action stage of change. As such, her goal is to continue weight management plan.  She has agreed to: Journaling 1100 calories with 80 + g of protein per day    Physical Activity Marilyn [name] is walking 30  minutes 4 days per week   Marilyn [name] has been advised to work up to 300-450 minutes of moderate intensity aerobic activity a week and strengthening exercises 2-3  times per week for cardiovascular health, weight loss maintenance and preservation of muscle mass.  She has agreed to : Think about enjoyable ways to increase daily physical activity and overcoming barriers to exercise and Increase volume of physical activity to a goal of 240 minutes a week   Behavioral Modifications Evidence-based interventions for health behavior change were utilized today including the discussion of   1) problem-solving barriers:    - Measure and eat lean proteins   2) SMART goals for next OV:    - Restart going back to the gym 3 days a week   Regarding patient's less desirable eating habits and patterns, we employed the technique of small changes.   We discussed the following today: increasing lean protein intake to established goals, keeping healthy foods at home, decreasing eating out or consumption of processed foods, and making healthy choices when eating convenient foods, and planning for success Additional resources provided today: Handout on traveling and holiday eating strategiesHandout on Holiday Recipes   Medical Interventions/ Pharmacotherapy Previous Bariatric surgery: n/a Pharmacotherapy for weight loss: She is not currently taking medications  for medical weight loss.    We discussed various medication options to help Marilyn [name] with her weight loss efforts and we both agreed to : Continue with current nutritional and behavioral strategies  B) OBESITY RELATED CONDITIONS ADDRESSED TODAY:  Prediabetes Assessment & Plan Lab Results  Component Value Date   HGBA1C 6.1 (H) 12/10/2023   HGBA1C 5.8 (H) 08/13/2023   HGBA1C 6.4 02/08/2023   INSULIN  14.9 12/10/2023   INSULIN  11.3 08/13/2023    Diet and life style controlled. Patient states that she is not getting in her recommended amount of protein. She endorses averaging about 70 grams of protein. She reports no excessive hunger but still has cravings for nuts and salty snacks. Reminded patient that  eating all protein on plan will help decrease cravings. Explained to patient the correlation between insulin  and simple carbs and increase in craving hunger signals. Continue to follow prudent meal plan and decreasing simple carbs and sugars.     Hypertension, unspecified type with aortic arthrosclerosis (12/22) Assessment & Plan BP Readings from Last 3 Encounters:  02/07/24 124/78  01/16/24 130/61  01/08/24 (!) 140/74   The 10-year ASCVD risk score (Arnett DK, et al., 2019) is: 8.7%  Lab Results  Component Value Date   CREATININE 0.73 08/13/2023   On Aldactone  50 mg daily. BP today is at goal - well controlled. Continue with medications. No acute concerns today. Continue to follow meal plan low in sodium.     Vitamin D  deficiency Assessment & Plan Lab Results  Component Value Date   VD25OH 66.8 12/10/2023   VD25OH 27.8 (L) 08/13/2023   VD25OH 34.46 07/27/2015   Taking OTC Vit D 50K units once weekly with reported good compliance and tolerance. Patient states that she has no issues remembering to take her supplementation. Continue with supplementation. Will re obtain labs in the near future.     Follow up:   Return 03/13/2024 at 11:40 AM  She was informed of the importance of frequent follow up visits to maximize her success with intensive lifestyle modifications for her multiple health conditions.   Weight Summary and Biometrics   Weight Lost Since Last Visit: 0lb  Weight Gained Since Last Visit: 5lb   Vitals Temp: 98 F (36.7 C) BP: 124/78 Pulse Rate: 73 SpO2: 99 %   Anthropometric Measurements Height: 5' 4.5 (1.638 m) Weight: 220 lb (99.8 kg) BMI (Calculated): 37.19 Weight at Last Visit: 215lb Weight Lost Since Last Visit: 0lb Weight Gained Since Last Visit: 5lb Starting Weight: 219lb Total Weight Loss (lbs): 0 lb (0 kg) Peak Weight: 227lb   Body Composition  Body Fat %: 50.4 % Fat Mass (lbs): 110.8 lbs Muscle Mass (lbs): 103.6 lbs Total Body Water  (lbs): 82.6 lbs Visceral Fat Rating : 16   Other Clinical Data Fasting: Yes Labs: No Today's Visit #: 8 Starting Date: 08/13/23    Objective:   PHYSICAL EXAM: Blood pressure 124/78, pulse 73, temperature 98 F (36.7 C), height 5' 4.5 (1.638 m), weight 220 lb (99.8 kg), SpO2 99%. Body mass index is 37.18 kg/m.  General: she is overweight, cooperative and in no acute distress. PSYCH: Has normal mood, affect and thought process.   HEENT: EOMI, sclerae are anicteric. Lungs: Normal breathing effort, no conversational dyspnea. Extremities: Moves * 4 Neurologic: A and O * 3, good insight  DIAGNOSTIC DATA REVIEWED: BMET    Component Value Date/Time   NA 138 08/13/2023 1055   NA 138 11/04/2013 0929   K 4.5 08/13/2023 1055   K 3.8 11/04/2013 0929   CL 101 08/13/2023 1055   CL 107 03/22/2012 0853   CO2 17 (L) 08/13/2023 1055   CO2 23 11/04/2013 0929   GLUCOSE  90 08/13/2023 1055   GLUCOSE 89 06/22/2023 1400   GLUCOSE 100 11/04/2013 0929   GLUCOSE 99 03/22/2012 0853   BUN 11 08/13/2023 1055   BUN 12.1 11/04/2013 0929   CREATININE 0.73 08/13/2023 1055   CREATININE 0.8 11/04/2013 0929   CALCIUM  10.1 08/13/2023 1055   CALCIUM  9.6 11/04/2013 0929   GFRNONAA >60 06/22/2023 1400   GFRAA >60 07/14/2016 2305   Lab Results  Component Value Date   HGBA1C 6.1 (H) 12/10/2023   HGBA1C 6.2 02/09/2022   Lab Results  Component Value Date   INSULIN  14.9 12/10/2023   INSULIN  11.3 08/13/2023   Lab Results  Component Value Date   TSH 1.420 08/13/2023   CBC    Component Value Date/Time   WBC 8.9 08/13/2023 1055   WBC 10.2 06/22/2023 1400   RBC 5.12 08/13/2023 1055   RBC 5.32 (H) 06/22/2023 1400   HGB 11.9 08/13/2023 1055   HGB 12.2 11/04/2013 0928   HCT 40.1 08/13/2023 1055   HCT 38.9 11/04/2013 0928   PLT 283 08/13/2023 1055   MCV 78 (L) 08/13/2023 1055   MCV 74.7 (L) 11/04/2013 0928   MCH 23.2 (L) 08/13/2023 1055   MCH 23.5 (L) 06/22/2023 1400   MCHC 29.7 (L)  08/13/2023 1055   MCHC 31.0 06/22/2023 1400   RDW 14.4 08/13/2023 1055   RDW 14.7 (H) 11/04/2013 0928   Iron Studies    Component Value Date/Time   IRON 74 12/25/2019 1415   TIBC 319 12/25/2019 1415   FERRITIN 110 12/25/2019 1415   IRONPCTSAT 23 12/25/2019 1415   Lipid Panel     Component Value Date/Time   CHOL 158 08/13/2023 1055   TRIG 74 08/13/2023 1055   HDL 78 08/13/2023 1055   CHOLHDL 2.0 08/13/2023 1055   CHOLHDL 3 02/08/2023 1127   VLDL 14.6 02/08/2023 1127   LDLCALC 66 08/13/2023 1055   LDLDIRECT 102 (H) 10/14/2021 1326   LDLDIRECT 118.9 12/18/2011 1138   Hepatic Function Panel     Component Value Date/Time   PROT 7.4 08/13/2023 1055   PROT 7.2 11/04/2013 0929   ALBUMIN 4.5 08/13/2023 1055   ALBUMIN 3.6 11/04/2013 0929   AST 14 08/13/2023 1055   AST 12 11/04/2013 0929   ALT 13 08/13/2023 1055   ALT 13 11/04/2013 0929   ALKPHOS 104 08/13/2023 1055   ALKPHOS 108 11/04/2013 0929   BILITOT 0.4 08/13/2023 1055   BILITOT 0.28 11/04/2013 0929   BILIDIR 0.0 11/12/2018 0912      Component Value Date/Time   TSH 1.420 08/13/2023 1055   Nutritional Lab Results  Component Value Date   VD25OH 66.8 12/10/2023   VD25OH 27.8 (L) 08/13/2023   VD25OH 34.46 07/27/2015    Attestations:   LILLETTE Sonny Laroche, acting as a stage manager for Marilyn Jenkins, DO., have compiled all relevant documentation for today's office visit on behalf of Marilyn Jenkins, DO, while in the presence of [employer], DO.   I have reviewed the above documentation for accuracy and completeness, and I agree with the above. Marilyn JINNY [name], D.O.  The 21st Century Cures Act was signed into law in 2016 which includes the topic of electronic health records.  This provides immediate access to information in MyChart.  This includes consultation notes, operative notes, office notes, lab results and pathology reports.  If you have any questions about what you read please let us  know at your  next visit so we can discuss your concerns and take  corrective action if need be.  We are right here with you.

## 2024-02-13 ENCOUNTER — Encounter: Payer: Self-pay | Admitting: Internal Medicine

## 2024-02-13 ENCOUNTER — Ambulatory Visit: Admitting: Internal Medicine

## 2024-02-13 VITALS — BP 122/76 | HR 60 | Ht 64.5 in | Wt 222.8 lb

## 2024-02-13 DIAGNOSIS — J449 Chronic obstructive pulmonary disease, unspecified: Secondary | ICD-10-CM

## 2024-02-13 DIAGNOSIS — E785 Hyperlipidemia, unspecified: Secondary | ICD-10-CM

## 2024-02-13 DIAGNOSIS — R198 Other specified symptoms and signs involving the digestive system and abdomen: Secondary | ICD-10-CM | POA: Insufficient documentation

## 2024-02-13 DIAGNOSIS — R0683 Snoring: Secondary | ICD-10-CM | POA: Diagnosis not present

## 2024-02-13 MED ORDER — METHYLPREDNISOLONE ACETATE 80 MG/ML IJ SUSP
80.0000 mg | Freq: Once | INTRAMUSCULAR | Status: AC
  Administered 2024-02-13: 12:00:00 80 mg via INTRAMUSCULAR

## 2024-02-13 MED ORDER — ALBUTEROL SULFATE HFA 108 (90 BASE) MCG/ACT IN AERS: 2.0000 | INHALATION_SPRAY | Freq: Four times a day (QID) | RESPIRATORY_TRACT | 6 refills | Status: AC | PRN

## 2024-02-13 MED ORDER — MUPIROCIN 2 % EX OINT: TOPICAL_OINTMENT | CUTANEOUS | 0 refills | Status: AC

## 2024-02-13 NOTE — Assessment & Plan Note (Signed)
 Worse Home sleep test was denied? Sleep ref - Dr Jude

## 2024-02-13 NOTE — Assessment & Plan Note (Signed)
 Worse post-URI Cont on Symbicort  bid Get CXR, labs Surgical clearance - planning to have R foot surgery on 02/14/2022: she is clear for surgery Depo-medrol  80 mg IM

## 2024-02-13 NOTE — Assessment & Plan Note (Signed)
Declined statins 12/22 coronary calcium CT score is 79 Start fish oil

## 2024-02-13 NOTE — Assessment & Plan Note (Signed)
 Recent Clean well Bactroban  bid on a Q tip

## 2024-02-13 NOTE — Progress Notes (Signed)
 Subjective:  Patient ID: Marilyn [name], female    DOB: 1955-05-04  Age: 68 y.o. MRN: [id number]  CC: Medical Management of Chronic Issues (6 Month follow up)   HPI Marilyn [name] presents for dry mouth, phlegm in am, snoring is worse  Outpatient Medications Prior to Visit  Medication Sig Dispense Refill   ALPRAZolam  (XANAX ) 1 MG tablet Take 0.5-1 tablets (0.5-1 mg total) by mouth 3 (three) times daily as needed for anxiety. 90 tablet 1   budesonide -formoterol  (SYMBICORT ) 80-4.5 MCG/ACT inhaler Inhale 2 puffs into the lungs 2 (two) times daily. 1 each 11   busPIRone  (BUSPAR ) 30 MG tablet TAKE 1/2 TO 1 (ONE-HALF TO ONE) TABLET BY MOUTH TWICE DAILY 60 tablet 2   cetirizine  (ZYRTEC ) 10 MG chewable tablet Chew 10 mg by mouth daily.     Cholecalciferol  1.25 MG (50000 UT) TABS 1 po q 7 days taking OTC D3     Menthol , Topical Analgesic, (BIOFREEZE) 10 % CREA Apply 1 application  topically daily as needed (pain).     rosuvastatin  (CRESTOR ) 10 MG tablet Take 1 tablet (10 mg total) by mouth daily. 90 tablet 3   spironolactone  (ALDACTONE ) 50 MG tablet Take 1 tablet (50 mg total) by mouth daily. 90 tablet 3   Trolamine Salicylate (BLUE-EMU HEMP EX) Apply 1 Application topically daily as needed (pain).     albuterol  (VENTOLIN  HFA) 108 (90 Base) MCG/ACT inhaler Inhale 2 puffs into the lungs every 6 (six) hours as needed for wheezing or shortness of breath. 8 g 6   clindamycin  (CLEOCIN ) 150 MG capsule Take 450mg  PO prior procedure (Patient not taking: Reported on 02/07/2024) 3 capsule 0   No facility-administered medications prior to visit.    ROS: Review of Systems  Constitutional:  Negative for activity change, appetite change, chills, fatigue and unexpected weight change.  HENT:  Positive for postnasal drip and rhinorrhea. Negative for congestion, mouth sores, sinus pressure and trouble swallowing.   Eyes:  Negative for visual disturbance.  Respiratory:  Negative for  cough and chest tightness.   Gastrointestinal:  Negative for abdominal pain and nausea.  Genitourinary:  Negative for difficulty urinating, frequency and vaginal pain.  Musculoskeletal:  Positive for arthralgias and back pain. Negative for gait problem.  Skin:  Negative for pallor and rash.  Neurological:  Negative for dizziness, tremors, weakness, numbness and headaches.  Psychiatric/Behavioral:  Positive for sleep disturbance. Negative for confusion. The patient is not nervous/anxious.     Objective:  BP 122/76   Pulse 60   Ht 5' 4.5 (1.638 m)   Wt 222 lb 12.8 oz (101.1 kg)   SpO2 99%   BMI 37.65 kg/m   BP Readings from Last 3 Encounters:  02/13/24 122/76  02/07/24 124/78  01/16/24 130/61    Wt Readings from Last 3 Encounters:  02/13/24 222 lb 12.8 oz (101.1 kg)  02/07/24 220 lb (99.8 kg)  01/16/24 222 lb 3.2 oz (100.8 kg)    Physical Exam Constitutional:      General: She is not in acute distress.    Appearance: She is well-developed. She is obese.  HENT:     Head: Normocephalic.     Right Ear: External ear normal.     Left Ear: External ear normal.     Nose: Congestion and rhinorrhea present.  Eyes:     General:        Right eye: No discharge.        Left eye: No discharge.  Conjunctiva/sclera: Conjunctivae normal.     Pupils: Pupils are equal, round, and reactive to light.  Neck:     Thyroid : No thyromegaly.     Vascular: No JVD.     Trachea: No tracheal deviation.  Cardiovascular:     Rate and Rhythm: Normal rate and regular rhythm.     Heart sounds: Normal heart sounds.  Pulmonary:     Effort: No respiratory distress.     Breath sounds: No stridor. No wheezing.  Abdominal:     General: Bowel sounds are normal. There is no distension.     Palpations: Abdomen is soft. There is no mass.     Tenderness: There is no abdominal tenderness. There is no guarding or rebound.  Musculoskeletal:        General: No tenderness.     Cervical back: Normal range  of motion and neck supple. No rigidity.     Right lower leg: No edema.     Left lower leg: No edema.  Lymphadenopathy:     Cervical: No cervical adenopathy.  Skin:    Findings: No erythema or rash.  Neurological:     Cranial Nerves: No cranial nerve deficit.     Motor: No abnormal muscle tone.     Coordination: Coordination normal.     Deep Tendon Reflexes: Reflexes normal.  Psychiatric:        Behavior: Behavior normal.        Thought Content: Thought content normal.        Judgment: Judgment normal.   Umbilicus w/minor crusting inside  Lab Results  Component Value Date   WBC 8.9 08/13/2023   HGB 11.9 08/13/2023   HCT 40.1 08/13/2023   PLT 283 08/13/2023   GLUCOSE 90 08/13/2023   CHOL 158 08/13/2023   TRIG 74 08/13/2023   HDL 78 08/13/2023   LDLDIRECT 102 (H) 10/14/2021   LDLCALC 66 08/13/2023   ALT 13 08/13/2023   AST 14 08/13/2023   NA 138 08/13/2023   K 4.5 08/13/2023   CL 101 08/13/2023   CREATININE 0.73 08/13/2023   BUN 11 08/13/2023   CO2 17 (L) 08/13/2023   TSH 1.420 08/13/2023   HGBA1C 6.1 (H) 12/10/2023    No results found.  Assessment & Plan:   Problem List Items Addressed This Visit     COPD mixed type (HCC) - Primary   Worse post-URI Cont on Symbicort  bid Get CXR, labs Surgical clearance - planning to have R foot surgery on 02/14/2022: she is clear for surgery Depo-medrol  80 mg IM      Relevant Medications   albuterol  (VENTOLIN  HFA) 108 (90 Base) MCG/ACT inhaler   Dyslipidemia   Declined statins 12/22 coronary calcium  CT score is 79 Start fish oil       Snoring   Worse Home sleep test was denied? Sleep ref - Dr Jude      Relevant Orders   Ambulatory referral to Pulmonology   Umbilical discharge   Recent Clean well Bactroban  bid on a Q tip         Meds ordered this encounter  Medications   albuterol  (VENTOLIN  HFA) 108 (90 Base) MCG/ACT inhaler    Sig: Inhale 2 puffs into the lungs every 6 (six) hours as needed for  wheezing or shortness of breath.    Dispense:  8 g    Refill:  6   mupirocin  ointment (BACTROBAN ) 2 %    Sig: Use bid    Dispense:  30 g  Refill:  0      Follow-up: Return in about 3 months (around 05/13/2024) for a follow-up visit.  Marolyn Noel, MD

## 2024-02-13 NOTE — Patient Instructions (Signed)

## 2024-02-13 NOTE — Addendum Note (Signed)
 Addended byBETHA LUCETTA CLEATRICE LELON on: 02/13/2024 11:54 AM   Modules accepted: Orders

## 2024-03-03 ENCOUNTER — Telehealth: Payer: Self-pay | Admitting: Orthopaedic Surgery

## 2024-03-03 NOTE — Telephone Encounter (Signed)
 Patient called. Would like to renew her handicap placard.

## 2024-03-03 NOTE — Telephone Encounter (Signed)
 sure

## 2024-03-03 NOTE — Telephone Encounter (Signed)
 Called and notified patient that the form will be up front and ready for pick up.

## 2024-03-08 ENCOUNTER — Other Ambulatory Visit (HOSPITAL_BASED_OUTPATIENT_CLINIC_OR_DEPARTMENT_OTHER): Payer: Self-pay | Admitting: Cardiovascular Disease

## 2024-03-13 ENCOUNTER — Encounter (INDEPENDENT_AMBULATORY_CARE_PROVIDER_SITE_OTHER): Payer: Self-pay | Admitting: Family Medicine

## 2024-03-13 ENCOUNTER — Ambulatory Visit (INDEPENDENT_AMBULATORY_CARE_PROVIDER_SITE_OTHER): Admitting: Family Medicine

## 2024-03-13 DIAGNOSIS — Z6836 Body mass index (BMI) 36.0-36.9, adult: Secondary | ICD-10-CM

## 2024-03-13 DIAGNOSIS — E559 Vitamin D deficiency, unspecified: Secondary | ICD-10-CM

## 2024-03-13 DIAGNOSIS — Z6837 Body mass index (BMI) 37.0-37.9, adult: Secondary | ICD-10-CM

## 2024-03-13 DIAGNOSIS — R7303 Prediabetes: Secondary | ICD-10-CM

## 2024-03-13 MED ORDER — CHOLECALCIFEROL 1.25 MG (50000 UT) PO TABS: ORAL_TABLET | ORAL | Status: AC

## 2024-03-13 NOTE — Progress Notes (Signed)
 "  [name], D.O.  ABFM, ABOM Specializing in Clinical Bariatric Medicine  Office located at: [address] [place], [place]  [address]    Recommended that patient obtain Fasting insulin , A1C and Vit D etc. labs at next OV.   Medications Discontinued During This Encounter  Medication Reason   Cholecalciferol  1.25 MG (50000 UT) TABS Reorder     Meds ordered this encounter  Medications   Cholecalciferol  1.25 MG (50000 UT) TABS    Sig: 1 po q 7 days taking OTC D3       A) FOR THE CHRONIC DISEASE OF OBESITY:  Chief complaint: Obesity Miyoko is here to discuss her progress with her obesity treatment plan.   History of present illness / Interval history:  Joslin McAdoo Mcchesney is here today for her follow-up office visit.  Since last OV on 02/07/24, pt is down 1 lb.    --> Patient will see her pulmonologist for her sleep study results in the near future.  Still struggles with getting in 7-9 hrs.   02/07/24 10:00 03/13/24 11:00   Body Fat % 50.4 % 48.7 %  Muscle Mass (lbs) 103.6 lbs 106.8 lbs  Fat Mass (lbs) 110.8 lbs 106.8 lbs  Total Body Water (lbs) 82.6 lbs 77.4 lbs  Visceral Fat Rating  16 16    Counseling done on how various foods will affect these numbers and how to maximize success.  Total lbs lost to date: 0 lbs Total Fat Mass in lbs lost to date: - 5.6 lbs Total weight loss percentage to date: 0 %    Morbid obesity (HCC) starting BMI 37.01 BMI 36.0-36.9,adult current BMI 37.02 Nutrition Therapy She is Journaling 1100 calories with 80 + g of protein per day and states she is following her eating plan approximately 70 % of the time.   - Tracking Calories/Macros: yes  - Eating More Whole Foods: yes  - Adequate Protein Intake: no   - Adequate Water Intake: no   - Skipping Meals: yes  - Sleeping 7-9 Hours/ Night: no   Carry is currently in the action stage of change. As such, her goal is to continue weight management plan.  She has  agreed to: continue current plan   Physical Activity Makeda is biking 15-45  minutes 3 days per week   Shaqueta has been advised to work up to 300-450 minutes of moderate intensity aerobic activity a week and strengthening exercises 2-3 times per week for cardiovascular health, weight loss maintenance and preservation of muscle mass.  She has agreed to : Continue to gradually increase the amount and intensity of exercise routine and Combine aerobic and strengthening exercises for efficiency and improved cardiometabolic health.   Behavioral Modifications Evidence-based interventions for health behavior change were utilized today including the discussion of  1) self monitoring techniques:    - Continue journaling   2) problem-solving barriers:    - Break Proteins into smaller portions throughout the day  3) SMART goals for next OV:    - Increase exercise as tolerated.   Regarding patient's less desirable eating habits and patterns, we employed the technique of small changes.   We discussed the following today: increasing lean protein intake to established goals, avoiding skipping meals, reading food labels , and planning for success Additional resources provided today: Physician provided patient with handouts and personalized instruction on tracking and journaling using Apps (or how to handwrite in notebook) and using logs provided , Handout on Daily Food  Journaling Log, and Provided patient with personalized instruction given on how to measure portions and weigh foods for accurate calculation of calories   Medical Interventions/ Pharmacotherapy Previous Bariatric surgery: n/a Pharmacotherapy for weight loss: She is not currently taking medications  for medical weight loss.    We discussed various medication options to help [name] with her weight loss efforts and we both agreed to : Continue with current nutritional and behavioral strategies   B) OBESITY RELATED CONDITIONS  ADDRESSED TODAY:  Prediabetes Assessment & Plan Lab Results  Component Value Date   HGBA1C 6.1 (H) 12/10/2023   HGBA1C 5.8 (H) 08/13/2023   HGBA1C 6.4 02/08/2023   INSULIN  14.9 12/10/2023   INSULIN  11.3 08/13/2023    Diet and life style controlled. Patient states that she recently has had some sugar and salty cravings. She brought her journal in and in the last 2 weeks she has not reached her protein goal or stayed within her calorie limit. Extensive discussion of how to read food labels and accuretely journal and track calories. Patient states that she will focus on hitting her protein goals and journal accurately. Reminded patient the importance of decreasing simple carbs and sugars as they stimulate hunger and craving signals.     Vitamin D  deficiency Assessment & Plan Lab Results  Component Value Date   VD25OH 66.8 12/10/2023   VD25OH 27.8 (L) 08/13/2023   VD25OH 34.46 07/27/2015   Taking OTC Cholecalciferol  1.25 mg every 7 days with reported good compliance and tolerance. Patient states that she has no issues remembering to take supplements. Recommended to obtain labs next OV. Continue with supplementation.      Medications Discontinued During This Encounter  Medication Reason   Cholecalciferol  1.25 MG (50000 UT) TABS Reorder     Meds ordered this encounter  Medications   Cholecalciferol  1.25 MG (50000 UT) TABS    Sig: 1 po q 7 days taking OTC D3     Follow up:   Return 04/17/2024 at 9:40 AM  She was informed of the importance of frequent follow up visits to maximize her success with intensive lifestyle modifications for her multiple health conditions.   Weight Summary and Biometrics   Weight Lost Since Last Visit: 1lb  Weight Gained Since Last Visit: 0lb  Vitals Temp: 98.1 F (36.7 C) BP: 109/71 Pulse Rate: 74 SpO2: 98 %   Anthropometric Measurements Height: 5' 4.5 (1.638 m) Weight: 219 lb (99.3 kg) BMI (Calculated): 37.02 Weight at Last Visit:  220lb Weight Lost Since Last Visit: 1lb Weight Gained Since Last Visit: 0lb Starting Weight: 219lb Total Weight Loss (lbs): 0 lb (0 kg) Peak Weight: 227lb   Body Composition  Body Fat %: 48.7 % Fat Mass (lbs): 106.8 lbs Muscle Mass (lbs): 106.8 lbs Total Body Water (lbs): 77.4 lbs Visceral Fat Rating : 16   Other Clinical Data Fasting: no Labs: no Today's Visit #: 9 Starting Date: 08/13/23    Objective:   PHYSICAL EXAM: Blood pressure 109/71, pulse 74, temperature 98.1 F (36.7 C), height 5' 4.5 (1.638 m), weight 219 lb (99.3 kg), SpO2 98%. Body mass index is 37.01 kg/m.  General: she is overweight, cooperative and in no acute distress. PSYCH: Has normal mood, affect and thought process.   HEENT: EOMI, sclerae are anicteric. Lungs: Normal breathing effort, no conversational dyspnea. Extremities: Moves * 4 Neurologic: A and O * 3, good insight  DIAGNOSTIC DATA REVIEWED: BMET    Component Value Date/Time   NA 138 08/13/2023 1055  NA 138 11/04/2013 0929   K 4.5 08/13/2023 1055   K 3.8 11/04/2013 0929   CL 101 08/13/2023 1055   CL 107 03/22/2012 0853   CO2 17 (L) 08/13/2023 1055   CO2 23 11/04/2013 0929   GLUCOSE 90 08/13/2023 1055   GLUCOSE 89 06/22/2023 1400   GLUCOSE 100 11/04/2013 0929   GLUCOSE 99 03/22/2012 0853   BUN 11 08/13/2023 1055   BUN 12.1 11/04/2013 0929   CREATININE 0.73 08/13/2023 1055   CREATININE 0.8 11/04/2013 0929   CALCIUM  10.1 08/13/2023 1055   CALCIUM  9.6 11/04/2013 0929   GFRNONAA >60 06/22/2023 1400   GFRAA >60 07/14/2016 2305   Lab Results  Component Value Date   HGBA1C 6.1 (H) 12/10/2023   HGBA1C 6.2 02/09/2022   Lab Results  Component Value Date   INSULIN  14.9 12/10/2023   INSULIN  11.3 08/13/2023   Lab Results  Component Value Date   TSH 1.420 08/13/2023   CBC    Component Value Date/Time   WBC 8.9 08/13/2023 1055   WBC 10.2 06/22/2023 1400   RBC 5.12 08/13/2023 1055   RBC 5.32 (H) 06/22/2023 1400   HGB  11.9 08/13/2023 1055   HGB 12.2 11/04/2013 0928   HCT 40.1 08/13/2023 1055   HCT 38.9 11/04/2013 0928   PLT 283 08/13/2023 1055   MCV 78 (L) 08/13/2023 1055   MCV 74.7 (L) 11/04/2013 0928   MCH 23.2 (L) 08/13/2023 1055   MCH 23.5 (L) 06/22/2023 1400   MCHC 29.7 (L) 08/13/2023 1055   MCHC 31.0 06/22/2023 1400   RDW 14.4 08/13/2023 1055   RDW 14.7 (H) 11/04/2013 0928   Iron Studies    Component Value Date/Time   IRON 74 12/25/2019 1415   TIBC 319 12/25/2019 1415   FERRITIN 110 12/25/2019 1415   IRONPCTSAT 23 12/25/2019 1415   Lipid Panel     Component Value Date/Time   CHOL 158 08/13/2023 1055   TRIG 74 08/13/2023 1055   HDL 78 08/13/2023 1055   CHOLHDL 2.0 08/13/2023 1055   CHOLHDL 3 02/08/2023 1127   VLDL 14.6 02/08/2023 1127   LDLCALC 66 08/13/2023 1055   LDLDIRECT 102 (H) 10/14/2021 1326   LDLDIRECT 118.9 12/18/2011 1138   Hepatic Function Panel     Component Value Date/Time   PROT 7.4 08/13/2023 1055   PROT 7.2 11/04/2013 0929   ALBUMIN 4.5 08/13/2023 1055   ALBUMIN 3.6 11/04/2013 0929   AST 14 08/13/2023 1055   AST 12 11/04/2013 0929   ALT 13 08/13/2023 1055   ALT 13 11/04/2013 0929   ALKPHOS 104 08/13/2023 1055   ALKPHOS 108 11/04/2013 0929   BILITOT 0.4 08/13/2023 1055   BILITOT 0.28 11/04/2013 0929   BILIDIR 0.0 11/12/2018 0912      Component Value Date/Time   TSH 1.420 08/13/2023 1055   Nutritional Lab Results  Component Value Date   VD25OH 66.8 12/10/2023   VD25OH 27.8 (L) 08/13/2023   VD25OH 34.46 07/27/2015    Attestations:   LILLETTE Sonny Laroche, acting as a stage manager for Barnie Jenkins, DO., have compiled all relevant documentation for today's office visit on behalf of Barnie Jenkins, DO, while in the presence of [employer], DO.  I have reviewed the above documentation for accuracy and completeness, and I agree with the above. Barnie JINNY Jenkins, D.O.  The 21st Century Cures Act was signed into law in 2016 which includes the  topic of electronic health records.  This provides immediate access to  information in MyChart.  This includes consultation notes, operative notes, office notes, lab results and pathology reports.  If you have any questions about what you read please let us  know at your next visit so we can discuss your concerns and take corrective action if need be.  We are right here with you.  "

## 2024-03-14 ENCOUNTER — Ambulatory Visit: Admitting: Pulmonary Disease

## 2024-03-14 ENCOUNTER — Encounter: Payer: Self-pay | Admitting: Pulmonary Disease

## 2024-03-14 VITALS — BP 124/79 | HR 77 | Temp 98.2°F | Ht 64.0 in | Wt 223.0 lb

## 2024-03-14 DIAGNOSIS — J449 Chronic obstructive pulmonary disease, unspecified: Secondary | ICD-10-CM

## 2024-03-14 DIAGNOSIS — R0683 Snoring: Secondary | ICD-10-CM

## 2024-03-14 DIAGNOSIS — E669 Obesity, unspecified: Secondary | ICD-10-CM | POA: Diagnosis not present

## 2024-03-14 DIAGNOSIS — R5383 Other fatigue: Secondary | ICD-10-CM

## 2024-03-14 DIAGNOSIS — Z87891 Personal history of nicotine dependence: Secondary | ICD-10-CM | POA: Diagnosis not present

## 2024-03-14 DIAGNOSIS — G4730 Sleep apnea, unspecified: Secondary | ICD-10-CM

## 2024-03-14 DIAGNOSIS — G478 Other sleep disorders: Secondary | ICD-10-CM

## 2024-03-14 NOTE — Patient Instructions (Signed)
 Moderate probability of significant sleep apnea  We will schedule you for a home sleep test  Continue weight loss efforts  Tentative follow-up in about 3 months  Call us  with significant concerns

## 2024-03-14 NOTE — Progress Notes (Signed)
 "      Marilyn [name]    [id number]    Sep 06, 1955  Primary Care Physician:Plotnikov, Karlynn GAILS, MD  Referring Physician: Garald Karlynn GAILS, MD [address] [place],  [place] [address]  Chief complaint:   Patient with a history of sarcoidosis Being seen for concern for sleep apnea  Discussed the use of AI scribe software for clinical note transcription with the patient, who gave verbal consent to proceed.  History of Present Illness Marilyn [name] is a 69 year old female who presents with concerns about snoring and possible sleep apnea. She was referred by Dr. Platnikoff for evaluation of sleep apnea.  Her husband has observed her snoring for over a year and reports episodes where she stops breathing during sleep, necessitating him to shake her awake. She experiences dryness in her mouth and throat, leading to phlegm accumulation and coughing upon waking. Despite sleeping from around midnight to 8:30 AM, she does not feel rested and finds it hard to fall asleep. She denies morning headaches and night sweats but notes occasional difficulty with concentration and memory.  She is currently using Symbicort , two puffs twice a day, for her breathing issues. She has increased her activity level recently and is trying to be more active each day.  She has a family history of sleep apnea. She recalls a previous attempt to investigate her sleep apnea when her blood pressure was high, but the follow-up was not completed. Her cardiologist attempted to arrange a sleep study last summer, but insurance denied the in-lab study, and the home study order did not go through.  She quit smoking, which led to weight gain, and she is currently involved in a weight management program.   Outpatient Encounter Medications as of 03/14/2024  Medication Sig   albuterol  (VENTOLIN  HFA) 108 (90 Base) MCG/ACT inhaler Inhale 2 puffs into the lungs every 6 (six) hours as needed for wheezing or shortness of  breath.   ALPRAZolam  (XANAX ) 1 MG tablet Take 0.5-1 tablets (0.5-1 mg total) by mouth 3 (three) times daily as needed for anxiety.   budesonide -formoterol  (SYMBICORT ) 80-4.5 MCG/ACT inhaler Inhale 2 puffs into the lungs 2 (two) times daily.   busPIRone  (BUSPAR ) 30 MG tablet TAKE 1/2 TO 1 (ONE-HALF TO ONE) TABLET BY MOUTH TWICE DAILY   cetirizine  (ZYRTEC ) 10 MG chewable tablet Chew 10 mg by mouth daily.   Cholecalciferol  1.25 MG (50000 UT) TABS 1 po q 7 days taking OTC D3   Menthol , Topical Analgesic, (BIOFREEZE) 10 % CREA Apply 1 application  topically daily as needed (pain).   mupirocin  ointment (BACTROBAN ) 2 % Use bid   rosuvastatin  (CRESTOR ) 10 MG tablet TAKE 1 TABLET BY MOUTH EVERY DAY   spironolactone  (ALDACTONE ) 50 MG tablet Take 1 tablet (50 mg total) by mouth daily.   Trolamine Salicylate (BLUE-EMU HEMP EX) Apply 1 Application topically daily as needed (pain).   clindamycin  (CLEOCIN ) 150 MG capsule Take 450mg  PO prior procedure (Patient not taking: Reported on 03/14/2024)   [DISCONTINUED] Cholecalciferol  1.25 MG (50000 UT) TABS 1 po q 7 days taking OTC D3   No facility-administered encounter medications on file as of 03/14/2024.    Allergies as of 03/14/2024 - Review Complete 03/14/2024  Allergen Reaction Noted   Hydrocodone   10/22/2023   Lexapro [escitalopram oxalate]  08/02/2017   Microzide  [hydrochlorothiazide ]  01/31/2021   Oxycodone  hcl  08/09/2023   Sulfamethoxazole-trimethoprim Itching 03/13/2024   Penicillins Rash and Other (See Comments)  Past Medical History:  Diagnosis Date   Allergy    Anemia    hx   Anxiety    panic disorder   Breast cancer (HCC) 06/06/2011   Cancer (HCC) 2013   BREAST - left   Cigarette smoker    Colon polyp    Complication of anesthesia    DIFFICULTY AWAKENING, BP INCREASE AFTER CHOLECYSTECTEMY    COPD (chronic obstructive pulmonary disease) (HCC)    Depression    Diverticulosis of colon    DJD (degenerative joint disease)     Family history of breast cancer    Family history of colon cancer    Family history of pancreatic cancer    Family history of prostate cancer    Gallstones    GERD (gastroesophageal reflux disease)    Pt. denies having GERD. Unable to remove it.   History of anemia    History of bronchitis    History of sarcoidosis    Hyperlipidemia    no meds needed   Hypertension 12/2020   Incisional breast wound    AT AGE 67 BILATERAL INCISION TO BREAST MADE   Joint pain    Leg swelling    Panic disorder    Personal history of radiation therapy    Pre-diabetes    S/P radiation therapy 08/17/11 - 09/28/11   LLQ - 50 Gy/25 Fractions with Boost of 10 Gy / 5 fractions   Sialoadenitis of submandibular gland 2016   Vitamin D  deficiency     Past Surgical History:  Procedure Laterality Date   BIOPSY BREAST     left breast  as a teenager  benign   BREAST BIOPSY Left 06/06/2011   BREAST EXCISIONAL BIOPSY Bilateral    BREAST LUMPECTOMY Left 2013   BREAST SURGERY  2013   left breast lumpectomy with needle loc & axillary sln bx   BUNIONECTOMY  2011   bilateral by Dr Lea   CHOLECYSTECTOMY  02/2019   COLONOSCOPY     polyp   cyst removed from right hand  1995   POLYPECTOMY     TONSILLECTOMY  1972   TOTAL ABDOMINAL HYSTERECTOMY  2000   Dr Rosalynn   TOTAL KNEE ARTHROPLASTY Right 07/02/2023   Procedure: ARTHROPLASTY, KNEE, TOTAL;  Surgeon: Jerri Kay HERO, MD;  Location: [facility] OR;  Service: Orthopedics;  Laterality: Right;   TRIGGER FINGER RELEASE  2008   Dr Sissy    Family History  Problem Relation Age of Onset   Obesity Mother    Alcohol abuse Mother    Depression Mother    Hyperlipidemia Mother    Dementia Mother    Hypertension Mother    Anemia Mother    Other Mother        + H Pylori   Seizures Mother    Heart disease Mother    Heart disease Father    Hyperlipidemia Father    Hypertension Father    Prostate cancer Father        dx over 42   Colon cancer Father 69        diagnosed 2009   Bladder Cancer Father        dx over 77   Colon polyps Father    Hypertension Sister    Breast cancer Sister        dx in her 30s; mat half sister   Pancreatic cancer Sister 54       d. 69   Lung cancer Sister    Hypertension Sister  Sarcoidosis Sister        mat 1/2 sister   Multiple sclerosis Brother        #2   Hypertension Brother        #1   Hyperlipidemia Brother    Prostate cancer Brother        dx under 50   Dementia Maternal Aunt    Dementia Maternal Aunt    Seizures Maternal Uncle    Lung cancer Maternal Uncle    Lung cancer Paternal Aunt    Breast cancer Cousin        pat first cousin d. 31   Sarcoidosis Cousin        mat first cousin   Breast cancer Cousin        maternal 2nd cousins - distant   Brain cancer Other        benign   Esophageal cancer Neg Hx    Stomach cancer Neg Hx    Rectal cancer Neg Hx     Social History   Socioeconomic History   Marital status: Married    Spouse name: Not on file   Number of children: 2   Years of education: Not on file   Highest education level: Bachelor's degree (e.g., BA, AB, BS)  Occupational History   Occupation: retired  Tobacco Use   Smoking status: Former    Current packs/day: 0.00    Average packs/day: 0.5 packs/day    Types: Cigarettes    Quit date: 12/28/2020    Years since quitting: 3.2   Smokeless tobacco: Never  Vaping Use   Vaping status: Never Used  Substance and Sexual Activity   Alcohol use: Yes    Alcohol/week: 0.0 - 1.0 standard drinks of alcohol    Comment: social   Drug use: Never   Sexual activity: Not Currently    Birth control/protection: Surgical    Comment: menses age 11,hrt started  03/19/1998  Other Topics Concern   Not on file  Social History Narrative   Widowed - husband died 2003-03-20 and 2 step-childrenExercises 3x per week2 cups of caffeine daily   Social Drivers of Health   Tobacco Use: Medium Risk (03/14/2024)   Patient History    Smoking Tobacco Use:  Former    Smokeless Tobacco Use: Never    Passive Exposure: Not on file  Financial Resource Strain: Low Risk (02/09/2024)   Overall Financial Resource Strain (CARDIA)    Difficulty of Paying Living Expenses: Not very hard  Food Insecurity: No Food Insecurity (02/09/2024)   Epic    Worried About Radiation Protection Practitioner of Food in the Last Year: Never true    Ran Out of Food in the Last Year: Never true  Transportation Needs: No Transportation Needs (02/09/2024)   Epic    Lack of Transportation (Medical): No    Lack of Transportation (Non-Medical): No  Physical Activity: Insufficiently Active (02/09/2024)   Exercise Vital Sign    Days of Exercise per Week: 2 days    Minutes of Exercise per Session: 20 min  Stress: Stress Concern Present (02/09/2024)   [employer] of Occupational Health - Occupational Stress Questionnaire    Feeling of Stress: To some extent  Social Connections: Socially Integrated (02/09/2024)   Social Connection and Isolation Panel    Frequency of Communication with Friends and Family: More than three times a week    Frequency of Social Gatherings with Friends and Family: Patient declined    Attends Religious Services: More than 4 times per  year    Active Member of Clubs or Organizations: Yes    Attends Banker Meetings: More than 4 times per year    Marital Status: Married  Catering Manager Violence: Not At Risk (01/16/2024)   Epic    Fear of Current or Ex-Partner: No    Emotionally Abused: No    Physically Abused: No    Sexually Abused: No  Depression (PHQ2-9): Medium Risk (02/13/2024)   Depression (PHQ2-9)    PHQ-2 Score: 8  Alcohol Screen: Low Risk (02/09/2024)   Alcohol Screen    Last Alcohol Screening Score (AUDIT): 1  Housing: Low Risk (02/09/2024)   Epic    Unable to Pay for Housing in the Last Year: No    Number of Times Moved in the Last Year: 0    Homeless in the Last Year: No  Utilities: Not At Risk (01/16/2024)   Epic    Threatened  with loss of utilities: No  Health Literacy: Adequate Health Literacy (01/16/2024)   B1300 Health Literacy    Frequency of need for help with medical instructions: Never    Review of Systems  Psychiatric/Behavioral:  Positive for sleep disturbance.     Vitals:   03/14/24 0900  BP: 124/79  Pulse: 77  Temp: 98.2 F (36.8 C)  SpO2: 99%     Physical Exam Constitutional:      Appearance: She is obese.  HENT:     Head: Normocephalic.     Mouth/Throat:     Mouth: Mucous membranes are moist.     Comments: Crowded oropharynx Eyes:     General: No scleral icterus.    Pupils: Pupils are equal, round, and reactive to light.  Cardiovascular:     Rate and Rhythm: Normal rate and regular rhythm.     Heart sounds: No murmur heard.    No friction rub.  Pulmonary:     Effort: No respiratory distress.     Breath sounds: No stridor. No wheezing or rhonchi.  Musculoskeletal:     Cervical back: Normal range of motion.  Neurological:     Mental Status: She is alert.  Psychiatric:        Mood and Affect: Mood normal.    Epworth Sleepiness Scale of 13  Data Reviewed: Most recent pulmonary function test 03/28/2023-no obstruction there is significant bronchodilator response, no restriction, there is evidence of air trapping with mild reduction in diffusing capacity  CT chest 01/17/2023 with no evidence of interstitial lung disease some atelectasis at the bases, evidence of emphysema  Assessment and Plan Assessment & Plan Obstructive sleep apnea Suspected obstructive sleep apnea due to snoring, witnessed apneas, and daytime fatigue. Symptoms include dry mouth, phlegm backup, and difficulty concentrating. Nocturnal symptoms include loud snoring and apneas requiring intervention. No morning headaches or night sweats. Memory is generally good, but concentration is sometimes impaired. Previous attempts to obtain a sleep study were unsuccessful due to insurance issues. Discussed the  pathophysiology of sleep apnea, including muscle relaxation during sleep leading to airway obstruction. Explained the risks of untreated sleep apnea, including hypertension and daytime fatigue. Discussed treatment options, including CPAP, oral devices, and surgery, emphasizing the importance of a sleep study to confirm diagnosis and guide treatment. Explained that CPAP is the first-line treatment, but not all patients tolerate it. Oral devices and surgery are alternatives for those who cannot tolerate CPAP. Weight loss can reduce apnea events by 20% with a 10% reduction in body weight. - Ordered home sleep study to  assess for obstructive sleep apnea. - Will discuss results and treatment options based on sleep study findings. - Encouraged weight loss as it can reduce apnea events by 20% with a 10% reduction in body weight.  Obesity Contributing factor to obstructive sleep apnea. Discussed the impact of weight on snoring and sleep apnea. Emphasized the importance of weight management in reducing apnea events. - Continue weight management program.  Obstructive lung disease appears well treated at present and stable  Pathophysiology of sleep disordered breathing discussed with the patient Treatment options discussed with the patient  Risk with not treating sleep disordered breathing discussed  Tentative follow-up in about 3 months  Encouraged to call with significant concerns   Orders Placed This Encounter  Procedures   Home sleep test    Standing Status:   Future    Expiration Date:   03/14/2025    Where should this test be performed::   [facility] - Pulmonary      Jennet Epley MD [facility] 03/14/2024, 9:30 AM  CC: Plotnikov, Karlynn GAILS, MD   "

## 2024-03-15 ENCOUNTER — Other Ambulatory Visit: Payer: Self-pay

## 2024-03-15 ENCOUNTER — Encounter (HOSPITAL_BASED_OUTPATIENT_CLINIC_OR_DEPARTMENT_OTHER): Payer: Self-pay | Admitting: Emergency Medicine

## 2024-03-15 ENCOUNTER — Emergency Department (HOSPITAL_BASED_OUTPATIENT_CLINIC_OR_DEPARTMENT_OTHER): Admission: EM | Admit: 2024-03-15 | Discharge: 2024-03-15 | Disposition: A | Discharge status: Home / Self Care

## 2024-03-15 DIAGNOSIS — R238 Other skin changes: Secondary | ICD-10-CM | POA: Insufficient documentation

## 2024-03-15 NOTE — Discharge Instructions (Addendum)
 Evaluation revealed that you have a small bulla or blister about your navel.  Please follow-up with dermatology.  If you develop surrounding redness, pustular oozing, swelling, develop a fever or abdominal pain or any other concerning symptom please return to the ED for further evaluation.

## 2024-03-15 NOTE — ED Triage Notes (Signed)
 Pt reports cyst on her naval that is oozing and painful.

## 2024-03-15 NOTE — ED Provider Notes (Signed)
 " [place] EMERGENCY DEPARTMENT AT [facility] Provider Note   CSN: [id number] Arrival date & time: 03/15/24  1005     Patient presents with: Cyst  HPI Marilyn [name] is a 69 y.o. female presenting for lesion around her navel.  She states has been there since this past October.  Reports it can be tender to touch intermittently.  She noticed some oozing that started yesterday.  Describes it as clear discharge.  Denies any surrounding swelling or discoloration of the skin.  Denies any radiation of the pain into her abdomen.   HPI     Prior to Admission medications  Medication Sig Start Date End Date Taking? Authorizing Provider  albuterol  (VENTOLIN  HFA) 108 (90 Base) MCG/ACT inhaler Inhale 2 puffs into the lungs every 6 (six) hours as needed for wheezing or shortness of breath. 02/13/24   Plotnikov, Aleksei V, MD  ALPRAZolam  (XANAX ) 1 MG tablet Take 0.5-1 tablets (0.5-1 mg total) by mouth 3 (three) times daily as needed for anxiety. 10/03/23   Plotnikov, Aleksei V, MD  budesonide -formoterol  (SYMBICORT ) 80-4.5 MCG/ACT inhaler Inhale 2 puffs into the lungs 2 (two) times daily. 11/07/23   Olalere, Jennet A, MD  busPIRone  (BUSPAR ) 30 MG tablet TAKE 1/2 TO 1 (ONE-HALF TO ONE) TABLET BY MOUTH TWICE DAILY 12/19/23   Plotnikov, Aleksei V, MD  cetirizine  (ZYRTEC ) 10 MG chewable tablet Chew 10 mg by mouth daily.    [provider]  Cholecalciferol  1.25 MG (50000 UT) TABS 1 po q 7 days taking OTC D3 03/13/24   Opalski, Barnie, DO  clindamycin  (CLEOCIN ) 150 MG capsule Take 450mg  PO prior procedure Patient not taking: Reported on 03/14/2024 01/23/24   Jerri Kay HERO, MD  Menthol , Topical Analgesic, (BIOFREEZE) 10 % CREA Apply 1 application  topically daily as needed (pain).    [provider]  mupirocin  ointment (BACTROBAN ) 2 % Use bid 02/13/24   Plotnikov, Aleksei V, MD  rosuvastatin  (CRESTOR ) 10 MG tablet TAKE 1 TABLET BY MOUTH EVERY DAY 03/10/24   Raford Riggs, MD  spironolactone  (ALDACTONE ) 50 MG tablet Take 1 tablet (50 mg total) by mouth daily. 05/01/23   Walker, Caitlin S, NP  Trolamine Salicylate (BLUE-EMU HEMP EX) Apply 1 Application topically daily as needed (pain).    [provider]    Allergies: Hydrocodone , Lexapro [escitalopram oxalate], Microzide  [hydrochlorothiazide ], Oxycodone  hcl, Sulfamethoxazole-trimethoprim, and Penicillins    Review of Systems See HPI  Updated Vital Signs BP (!) 160/77 (BP Location: Right Arm)   Pulse 88   Temp 98.4 F (36.9 C) (Oral)   Resp 18   SpO2 98%   Physical Exam Constitutional:      Appearance: Normal appearance.  HENT:     Head: Normocephalic.     Nose: Nose normal.  Eyes:     Conjunctiva/sclera: Conjunctivae normal.  Pulmonary:     Effort: Pulmonary effort is normal.  Abdominal:     General: Abdomen is flat. There is no distension.     Palpations: Abdomen is soft.     Tenderness: There is no abdominal tenderness.   Neurological:     Mental Status: She is alert.  Psychiatric:        Mood and Affect: Mood normal.     (all labs ordered are listed, but only abnormal results are displayed) Labs Reviewed - No data to display  EKG: None  Radiology: No results found.   Procedures   Medications Ordered in the ED - No data to display  Medical Decision Making  69 year old well-appearing female presenting for cystlike lesion about her navel.  She states has been there for several months.  Exam notable for small bulla about the navel with no surrounding signs of infection.  Does appear to be actively draining.  Otherwise she is well-appearing, vitals are reassuring and does not appear to have any acute abdominal problem that could be associated with this given how benign her exam was here today.  Advised her to follow-up with dermatology.  We did discuss utility of FNA but she declined which I thought was appropriate.  Discussed  return precautions.  Recommended Tylenol  for pain at home.  Discharged good condition.     Final diagnoses:  Skin bulla    ED Discharge Orders     None          Lang Norleen POUR, PA-C 03/15/24 1041    Ula Prentice SAUNDERS, MD 03/15/24 1252  "

## 2024-03-17 ENCOUNTER — Telehealth: Payer: Self-pay

## 2024-03-17 NOTE — Transitions of Care (Post Inpatient/ED Visit) (Signed)
" ° °  03/17/2024  Name: Marilyn [name] MRN: [id number] DOB: 1955-12-25  Today's TOC FU Call Status: Today's TOC FU Call Status:: Unsuccessful Call (1st Attempt) Unsuccessful Call (1st Attempt) Date: 03/17/24  Attempted to reach the patient regarding the most recent Inpatient/ED visit.  Follow Up Plan: Additional outreach attempts will be made to reach the patient to complete the Transitions of Care (Post Inpatient/ED visit) call.   Signature [facility] "

## 2024-03-23 ENCOUNTER — Other Ambulatory Visit: Payer: Self-pay | Admitting: Internal Medicine

## 2024-04-04 ENCOUNTER — Other Ambulatory Visit (HOSPITAL_BASED_OUTPATIENT_CLINIC_OR_DEPARTMENT_OTHER): Payer: Self-pay | Admitting: Family

## 2024-04-04 DIAGNOSIS — I10 Essential (primary) hypertension: Secondary | ICD-10-CM

## 2024-04-17 ENCOUNTER — Ambulatory Visit (INDEPENDENT_AMBULATORY_CARE_PROVIDER_SITE_OTHER): Admitting: Family Medicine

## 2024-05-13 ENCOUNTER — Ambulatory Visit: Admitting: Internal Medicine

## 2024-06-02 ENCOUNTER — Ambulatory Visit: Admitting: Pulmonary Disease

## 2024-07-11 ENCOUNTER — Ambulatory Visit: Admitting: Orthopaedic Surgery

## 2025-01-15 ENCOUNTER — Inpatient Hospital Stay: Admitting: Adult Health
